# Patient Record
Sex: Female | Born: 1945 | Race: Black or African American | Hispanic: No | Marital: Single | Smoking: Former smoker
Health system: Southern US, Community
[De-identification: ages and names within clinical notes are randomized; demographics above are authoritative.]

## PROBLEM LIST (undated history)

## (undated) DIAGNOSIS — M1990 Unspecified osteoarthritis, unspecified site: Secondary | ICD-10-CM

## (undated) DIAGNOSIS — E785 Hyperlipidemia, unspecified: Secondary | ICD-10-CM

## (undated) DIAGNOSIS — Z8614 Personal history of Methicillin resistant Staphylococcus aureus infection: Secondary | ICD-10-CM

## (undated) DIAGNOSIS — K219 Gastro-esophageal reflux disease without esophagitis: Secondary | ICD-10-CM

## (undated) DIAGNOSIS — E119 Type 2 diabetes mellitus without complications: Secondary | ICD-10-CM

## (undated) DIAGNOSIS — Z8719 Personal history of other diseases of the digestive system: Secondary | ICD-10-CM

## (undated) DIAGNOSIS — R3915 Urgency of urination: Secondary | ICD-10-CM

## (undated) DIAGNOSIS — K5909 Other constipation: Secondary | ICD-10-CM

## (undated) DIAGNOSIS — R7303 Prediabetes: Secondary | ICD-10-CM

## (undated) DIAGNOSIS — I10 Essential (primary) hypertension: Secondary | ICD-10-CM

## (undated) DIAGNOSIS — F329 Major depressive disorder, single episode, unspecified: Secondary | ICD-10-CM

## (undated) DIAGNOSIS — Z87440 Personal history of urinary (tract) infections: Secondary | ICD-10-CM

## (undated) DIAGNOSIS — N8110 Cystocele, unspecified: Secondary | ICD-10-CM

## (undated) DIAGNOSIS — F32A Depression, unspecified: Secondary | ICD-10-CM

## (undated) HISTORY — PX: APPENDECTOMY: SHX54

## (undated) HISTORY — DX: Depression, unspecified: F32.A

## (undated) HISTORY — DX: Major depressive disorder, single episode, unspecified: F32.9

## (undated) HISTORY — DX: Essential (primary) hypertension: I10

## (undated) HISTORY — DX: Hyperlipidemia, unspecified: E78.5

## (undated) HISTORY — PX: VAGINAL HYSTERECTOMY: SUR661

## (undated) HISTORY — PX: COLONOSCOPY: SHX174

## (undated) HISTORY — DX: Gastro-esophageal reflux disease without esophagitis: K21.9

## (undated) HISTORY — DX: Personal history of Methicillin resistant Staphylococcus aureus infection: Z86.14

## (undated) HISTORY — DX: Type 2 diabetes mellitus without complications: E11.9

## (undated) HISTORY — DX: Unspecified osteoarthritis, unspecified site: M19.90

---

## 1987-11-24 HISTORY — PX: BREAST EXCISIONAL BIOPSY: SUR124

## 1998-06-18 ENCOUNTER — Ambulatory Visit (HOSPITAL_COMMUNITY): Admission: RE | Admit: 1998-06-18 | Discharge: 1998-06-18 | Payer: Self-pay | Admitting: Family Medicine

## 1998-08-30 ENCOUNTER — Emergency Department (HOSPITAL_COMMUNITY): Admission: EM | Admit: 1998-08-30 | Discharge: 1998-08-30 | Payer: Self-pay | Admitting: Emergency Medicine

## 1998-12-30 ENCOUNTER — Encounter: Payer: Self-pay | Admitting: Emergency Medicine

## 1998-12-30 ENCOUNTER — Emergency Department (HOSPITAL_COMMUNITY): Admission: EM | Admit: 1998-12-30 | Discharge: 1998-12-30 | Payer: Self-pay | Admitting: Emergency Medicine

## 1999-01-25 ENCOUNTER — Emergency Department (HOSPITAL_COMMUNITY): Admission: EM | Admit: 1999-01-25 | Discharge: 1999-01-25 | Payer: Self-pay | Admitting: Emergency Medicine

## 1999-05-26 ENCOUNTER — Ambulatory Visit (HOSPITAL_COMMUNITY): Admission: RE | Admit: 1999-05-26 | Discharge: 1999-05-26 | Payer: Self-pay | Admitting: Family Medicine

## 2000-03-13 ENCOUNTER — Encounter: Payer: Self-pay | Admitting: Emergency Medicine

## 2000-03-13 ENCOUNTER — Emergency Department (HOSPITAL_COMMUNITY): Admission: EM | Admit: 2000-03-13 | Discharge: 2000-03-13 | Payer: Self-pay | Admitting: Emergency Medicine

## 2000-06-08 ENCOUNTER — Ambulatory Visit (HOSPITAL_COMMUNITY): Admission: RE | Admit: 2000-06-08 | Discharge: 2000-06-08 | Payer: Self-pay | Admitting: Family Medicine

## 2000-06-08 ENCOUNTER — Encounter: Payer: Self-pay | Admitting: Family Medicine

## 2000-12-31 ENCOUNTER — Emergency Department (HOSPITAL_COMMUNITY): Admission: EM | Admit: 2000-12-31 | Discharge: 2001-01-01 | Payer: Self-pay | Admitting: Emergency Medicine

## 2001-01-01 ENCOUNTER — Encounter: Payer: Self-pay | Admitting: Emergency Medicine

## 2001-01-25 ENCOUNTER — Other Ambulatory Visit: Admission: RE | Admit: 2001-01-25 | Discharge: 2001-01-25 | Payer: Self-pay | Admitting: *Deleted

## 2001-03-21 ENCOUNTER — Encounter: Admission: RE | Admit: 2001-03-21 | Discharge: 2001-03-21 | Payer: Self-pay | Admitting: Family Medicine

## 2001-03-21 ENCOUNTER — Encounter: Payer: Self-pay | Admitting: Family Medicine

## 2001-05-10 ENCOUNTER — Ambulatory Visit (HOSPITAL_BASED_OUTPATIENT_CLINIC_OR_DEPARTMENT_OTHER): Admission: RE | Admit: 2001-05-10 | Discharge: 2001-05-10 | Payer: Self-pay | Admitting: General Surgery

## 2001-05-10 HISTORY — PX: INGUINAL HERNIA REPAIR: SHX194

## 2001-06-13 ENCOUNTER — Ambulatory Visit (HOSPITAL_COMMUNITY): Admission: RE | Admit: 2001-06-13 | Discharge: 2001-06-13 | Payer: Self-pay | Admitting: Family Medicine

## 2001-08-09 ENCOUNTER — Emergency Department (HOSPITAL_COMMUNITY): Admission: EM | Admit: 2001-08-09 | Discharge: 2001-08-09 | Payer: Self-pay | Admitting: Emergency Medicine

## 2001-08-09 ENCOUNTER — Encounter: Payer: Self-pay | Admitting: Emergency Medicine

## 2001-09-06 ENCOUNTER — Encounter: Admission: RE | Admit: 2001-09-06 | Discharge: 2001-10-11 | Payer: Self-pay | Admitting: Family Medicine

## 2001-12-05 ENCOUNTER — Encounter: Payer: Self-pay | Admitting: Family Medicine

## 2001-12-05 ENCOUNTER — Encounter: Admission: RE | Admit: 2001-12-05 | Discharge: 2001-12-05 | Payer: Self-pay | Admitting: Family Medicine

## 2002-05-15 ENCOUNTER — Ambulatory Visit (HOSPITAL_COMMUNITY): Admission: RE | Admit: 2002-05-15 | Discharge: 2002-05-15 | Payer: Self-pay | Admitting: Family Medicine

## 2002-05-15 ENCOUNTER — Encounter: Payer: Self-pay | Admitting: Family Medicine

## 2003-06-04 ENCOUNTER — Ambulatory Visit (HOSPITAL_COMMUNITY): Admission: RE | Admit: 2003-06-04 | Discharge: 2003-06-04 | Payer: Self-pay | Admitting: Family Medicine

## 2003-06-04 ENCOUNTER — Encounter: Payer: Self-pay | Admitting: Family Medicine

## 2004-01-01 ENCOUNTER — Emergency Department (HOSPITAL_COMMUNITY): Admission: EM | Admit: 2004-01-01 | Discharge: 2004-01-01 | Payer: Self-pay | Admitting: Emergency Medicine

## 2004-02-10 ENCOUNTER — Emergency Department (HOSPITAL_COMMUNITY): Admission: EM | Admit: 2004-02-10 | Discharge: 2004-02-10 | Payer: Self-pay

## 2004-03-20 ENCOUNTER — Other Ambulatory Visit: Admission: RE | Admit: 2004-03-20 | Discharge: 2004-03-20 | Payer: Self-pay | Admitting: Family Medicine

## 2004-07-06 ENCOUNTER — Emergency Department (HOSPITAL_COMMUNITY): Admission: EM | Admit: 2004-07-06 | Discharge: 2004-07-06 | Payer: Self-pay | Admitting: Nurse Practitioner

## 2004-07-07 ENCOUNTER — Ambulatory Visit (HOSPITAL_COMMUNITY): Admission: RE | Admit: 2004-07-07 | Discharge: 2004-07-07 | Payer: Self-pay | Admitting: Family Medicine

## 2005-01-27 ENCOUNTER — Emergency Department (HOSPITAL_COMMUNITY): Admission: EM | Admit: 2005-01-27 | Discharge: 2005-01-27 | Payer: Self-pay | Admitting: Emergency Medicine

## 2005-02-20 ENCOUNTER — Emergency Department (HOSPITAL_COMMUNITY): Admission: EM | Admit: 2005-02-20 | Discharge: 2005-02-20 | Payer: Self-pay | Admitting: Emergency Medicine

## 2005-06-15 ENCOUNTER — Ambulatory Visit: Payer: Self-pay | Admitting: Gastroenterology

## 2005-06-22 ENCOUNTER — Ambulatory Visit: Payer: Self-pay | Admitting: Gastroenterology

## 2005-07-20 ENCOUNTER — Ambulatory Visit (HOSPITAL_COMMUNITY): Admission: RE | Admit: 2005-07-20 | Discharge: 2005-07-20 | Payer: Self-pay | Admitting: Obstetrics and Gynecology

## 2006-06-19 ENCOUNTER — Emergency Department (HOSPITAL_COMMUNITY): Admission: EM | Admit: 2006-06-19 | Discharge: 2006-06-19 | Payer: Self-pay | Admitting: Emergency Medicine

## 2006-07-05 ENCOUNTER — Ambulatory Visit: Payer: Self-pay | Admitting: Internal Medicine

## 2006-07-06 ENCOUNTER — Ambulatory Visit: Payer: Self-pay | Admitting: Internal Medicine

## 2006-07-15 ENCOUNTER — Ambulatory Visit: Payer: Self-pay | Admitting: Internal Medicine

## 2006-07-22 ENCOUNTER — Ambulatory Visit (HOSPITAL_COMMUNITY): Admission: RE | Admit: 2006-07-22 | Discharge: 2006-07-22 | Payer: Self-pay | Admitting: Internal Medicine

## 2006-07-24 ENCOUNTER — Emergency Department (HOSPITAL_COMMUNITY): Admission: EM | Admit: 2006-07-24 | Discharge: 2006-07-24 | Payer: Self-pay | Admitting: Emergency Medicine

## 2006-08-06 ENCOUNTER — Ambulatory Visit: Payer: Self-pay | Admitting: Internal Medicine

## 2006-08-17 ENCOUNTER — Ambulatory Visit: Payer: Self-pay | Admitting: Internal Medicine

## 2006-11-29 ENCOUNTER — Ambulatory Visit: Payer: Self-pay | Admitting: Internal Medicine

## 2007-01-24 ENCOUNTER — Ambulatory Visit: Payer: Self-pay | Admitting: Internal Medicine

## 2007-01-24 LAB — CONVERTED CEMR LAB
ALT: 88 units/L — ABNORMAL HIGH (ref 0–40)
CO2: 31 meq/L (ref 19–32)
Calcium: 9.4 mg/dL (ref 8.4–10.5)
Creatinine, Ser: 0.8 mg/dL (ref 0.4–1.2)
GFR calc Af Amer: 95 mL/min
GFR calc non Af Amer: 78 mL/min
Glucose, Bld: 118 mg/dL — ABNORMAL HIGH (ref 70–99)
Sodium: 140 meq/L (ref 135–145)
Triglycerides: 75 mg/dL (ref 0–149)
VLDL: 15 mg/dL (ref 0–40)

## 2007-01-31 ENCOUNTER — Ambulatory Visit: Payer: Self-pay | Admitting: Internal Medicine

## 2007-02-07 ENCOUNTER — Ambulatory Visit: Payer: Self-pay | Admitting: Internal Medicine

## 2007-02-07 LAB — CONVERTED CEMR LAB
Bacteria, UA: NEGATIVE
Nitrite: NEGATIVE
RBC / HPF: NONE SEEN
Specific Gravity, Urine: 1.01 (ref 1.000–1.03)
Urine Glucose: NEGATIVE mg/dL
pH: 6 (ref 5.0–8.0)

## 2007-03-28 ENCOUNTER — Ambulatory Visit: Payer: Self-pay | Admitting: Internal Medicine

## 2007-03-28 LAB — CONVERTED CEMR LAB
AST: 18 units/L (ref 0–37)
Cholesterol: 244 mg/dL (ref 0–200)
Direct LDL: 155.7 mg/dL
Hgb A1c MFr Bld: 6.6 % — ABNORMAL HIGH (ref 4.6–6.0)
Microalb Creat Ratio: 3.4 mg/g (ref 0.0–30.0)
Microalb, Ur: 0.6 mg/dL (ref 0.0–1.9)
Total CHOL/HDL Ratio: 4.1

## 2007-05-09 ENCOUNTER — Ambulatory Visit: Payer: Self-pay | Admitting: Internal Medicine

## 2007-05-14 ENCOUNTER — Ambulatory Visit: Payer: Self-pay | Admitting: Family Medicine

## 2007-06-16 ENCOUNTER — Ambulatory Visit: Payer: Self-pay | Admitting: Internal Medicine

## 2007-07-15 ENCOUNTER — Ambulatory Visit: Payer: Self-pay | Admitting: Internal Medicine

## 2007-07-15 LAB — CONVERTED CEMR LAB
ALT: 30 units/L (ref 0–35)
AST: 28 units/L (ref 0–37)
Cholesterol: 190 mg/dL (ref 0–200)
HDL: 59.8 mg/dL (ref >39.0)
LDL Cholesterol: 114 mg/dL — ABNORMAL HIGH (ref 0–99)
Total CHOL/HDL Ratio: 3.2

## 2007-08-11 ENCOUNTER — Ambulatory Visit (HOSPITAL_COMMUNITY): Admission: RE | Admit: 2007-08-11 | Discharge: 2007-08-11 | Payer: Self-pay | Admitting: Internal Medicine

## 2007-09-16 ENCOUNTER — Encounter: Admission: RE | Admit: 2007-09-16 | Discharge: 2007-09-16 | Payer: Self-pay | Admitting: Specialist

## 2007-09-30 ENCOUNTER — Telehealth: Payer: Self-pay | Admitting: Internal Medicine

## 2007-10-13 DIAGNOSIS — E785 Hyperlipidemia, unspecified: Secondary | ICD-10-CM | POA: Insufficient documentation

## 2007-10-13 DIAGNOSIS — F339 Major depressive disorder, recurrent, unspecified: Secondary | ICD-10-CM

## 2007-10-13 DIAGNOSIS — I10 Essential (primary) hypertension: Secondary | ICD-10-CM | POA: Insufficient documentation

## 2007-10-14 ENCOUNTER — Ambulatory Visit: Payer: Self-pay | Admitting: Internal Medicine

## 2007-10-14 DIAGNOSIS — G5600 Carpal tunnel syndrome, unspecified upper limb: Secondary | ICD-10-CM | POA: Insufficient documentation

## 2007-10-14 DIAGNOSIS — M25569 Pain in unspecified knee: Secondary | ICD-10-CM

## 2007-10-14 DIAGNOSIS — E119 Type 2 diabetes mellitus without complications: Secondary | ICD-10-CM | POA: Insufficient documentation

## 2007-10-14 DIAGNOSIS — M549 Dorsalgia, unspecified: Secondary | ICD-10-CM | POA: Insufficient documentation

## 2007-10-17 ENCOUNTER — Encounter (INDEPENDENT_AMBULATORY_CARE_PROVIDER_SITE_OTHER): Payer: Self-pay | Admitting: *Deleted

## 2007-10-28 ENCOUNTER — Telehealth: Payer: Self-pay | Admitting: Internal Medicine

## 2007-10-31 ENCOUNTER — Telehealth (INDEPENDENT_AMBULATORY_CARE_PROVIDER_SITE_OTHER): Payer: Self-pay | Admitting: *Deleted

## 2007-11-01 ENCOUNTER — Ambulatory Visit: Payer: Self-pay | Admitting: Internal Medicine

## 2007-11-01 DIAGNOSIS — J069 Acute upper respiratory infection, unspecified: Secondary | ICD-10-CM | POA: Insufficient documentation

## 2007-11-11 ENCOUNTER — Telehealth: Payer: Self-pay | Admitting: Internal Medicine

## 2007-11-14 ENCOUNTER — Ambulatory Visit: Payer: Self-pay | Admitting: Internal Medicine

## 2007-11-14 DIAGNOSIS — R300 Dysuria: Secondary | ICD-10-CM | POA: Insufficient documentation

## 2007-11-15 ENCOUNTER — Encounter: Payer: Self-pay | Admitting: Internal Medicine

## 2007-11-15 LAB — CONVERTED CEMR LAB
Crystals: NEGATIVE
Nitrite: POSITIVE — AB
Specific Gravity, Urine: 1.01 (ref 1.000–1.03)
Total Protein, Urine: NEGATIVE mg/dL

## 2007-12-08 ENCOUNTER — Ambulatory Visit: Payer: Self-pay | Admitting: Internal Medicine

## 2007-12-08 LAB — CONVERTED CEMR LAB
AST: 19 units/L (ref 0–37)
BUN: 12 mg/dL (ref 6–23)
Bilirubin Urine: NEGATIVE
CO2: 30 meq/L (ref 19–32)
Calcium: 9.5 mg/dL (ref 8.4–10.5)
Chloride: 106 meq/L (ref 96–112)
Creatinine, Ser: 0.7 mg/dL (ref 0.4–1.2)
Crystals: NEGATIVE
Glucose, Bld: 96 mg/dL (ref 70–99)
Hemoglobin, Urine: NEGATIVE
Ketones, ur: NEGATIVE mg/dL
LDL Cholesterol: 92 mg/dL (ref 0–99)
Nitrite: NEGATIVE
Potassium: 4.3 meq/L (ref 3.5–5.1)
RBC / HPF: NONE SEEN
Sodium: 142 meq/L (ref 135–145)
Specific Gravity, Urine: 1.015 (ref 1.000–1.03)
Total Protein, Urine: NEGATIVE mg/dL
Total Protein: 6.4 g/dL (ref 6.0–8.3)
Urine Glucose: NEGATIVE mg/dL
Urobilinogen, UA: 0.2 (ref 0.0–1.0)
VLDL: 11 mg/dL (ref 0–40)
pH: 7 (ref 5.0–8.0)

## 2007-12-19 ENCOUNTER — Ambulatory Visit: Payer: Self-pay | Admitting: Internal Medicine

## 2008-01-12 ENCOUNTER — Telehealth: Payer: Self-pay | Admitting: Internal Medicine

## 2008-01-27 ENCOUNTER — Telehealth (INDEPENDENT_AMBULATORY_CARE_PROVIDER_SITE_OTHER): Payer: Self-pay | Admitting: *Deleted

## 2008-01-31 ENCOUNTER — Encounter (INDEPENDENT_AMBULATORY_CARE_PROVIDER_SITE_OTHER): Payer: Self-pay | Admitting: *Deleted

## 2008-02-10 ENCOUNTER — Ambulatory Visit: Payer: Self-pay | Admitting: Internal Medicine

## 2008-02-10 DIAGNOSIS — N390 Urinary tract infection, site not specified: Secondary | ICD-10-CM | POA: Insufficient documentation

## 2008-02-10 DIAGNOSIS — L299 Pruritus, unspecified: Secondary | ICD-10-CM | POA: Insufficient documentation

## 2008-02-10 LAB — CONVERTED CEMR LAB
Blood in Urine, dipstick: NEGATIVE
Nitrite: NEGATIVE
Protein, U semiquant: NEGATIVE
Specific Gravity, Urine: 1.01

## 2008-03-20 ENCOUNTER — Telehealth (INDEPENDENT_AMBULATORY_CARE_PROVIDER_SITE_OTHER): Payer: Self-pay | Admitting: *Deleted

## 2008-04-04 ENCOUNTER — Telehealth: Payer: Self-pay | Admitting: Internal Medicine

## 2008-04-06 ENCOUNTER — Encounter: Payer: Self-pay | Admitting: Internal Medicine

## 2008-04-11 ENCOUNTER — Ambulatory Visit: Payer: Self-pay | Admitting: Internal Medicine

## 2008-04-11 LAB — CONVERTED CEMR LAB: Creatinine,U: 124.2 mg/dL

## 2008-04-23 ENCOUNTER — Ambulatory Visit: Payer: Self-pay | Admitting: Internal Medicine

## 2008-04-23 ENCOUNTER — Telehealth: Payer: Self-pay | Admitting: Internal Medicine

## 2008-07-17 ENCOUNTER — Encounter: Payer: Self-pay | Admitting: Internal Medicine

## 2008-07-31 ENCOUNTER — Telehealth: Payer: Self-pay | Admitting: Internal Medicine

## 2008-08-14 ENCOUNTER — Ambulatory Visit (HOSPITAL_COMMUNITY): Admission: RE | Admit: 2008-08-14 | Discharge: 2008-08-14 | Payer: Self-pay | Admitting: Internal Medicine

## 2008-08-17 ENCOUNTER — Encounter: Payer: Self-pay | Admitting: Internal Medicine

## 2008-09-13 ENCOUNTER — Telehealth: Payer: Self-pay | Admitting: Internal Medicine

## 2008-10-08 ENCOUNTER — Telehealth (INDEPENDENT_AMBULATORY_CARE_PROVIDER_SITE_OTHER): Payer: Self-pay | Admitting: *Deleted

## 2008-10-23 ENCOUNTER — Telehealth: Payer: Self-pay | Admitting: Internal Medicine

## 2008-10-29 ENCOUNTER — Encounter: Payer: Self-pay | Admitting: Internal Medicine

## 2008-10-31 ENCOUNTER — Ambulatory Visit: Payer: Self-pay | Admitting: Internal Medicine

## 2008-10-31 DIAGNOSIS — L0292 Furuncle, unspecified: Secondary | ICD-10-CM | POA: Insufficient documentation

## 2008-10-31 DIAGNOSIS — L0293 Carbuncle, unspecified: Secondary | ICD-10-CM

## 2008-11-08 ENCOUNTER — Ambulatory Visit: Payer: Self-pay | Admitting: Internal Medicine

## 2008-11-13 ENCOUNTER — Ambulatory Visit: Payer: Self-pay | Admitting: Internal Medicine

## 2008-11-13 LAB — CONVERTED CEMR LAB
BUN: 16 mg/dL (ref 6–23)
CO2: 29 meq/L (ref 19–32)
Calcium: 9.1 mg/dL (ref 8.4–10.5)
Cholesterol: 167 mg/dL (ref 0–200)
Creatinine, Ser: 0.7 mg/dL (ref 0.4–1.2)
Creatinine,U: 159.8 mg/dL
GFR calc Af Amer: 110 mL/min
GFR calc non Af Amer: 91 mL/min
Glucose, Bld: 96 mg/dL (ref 70–99)
HDL: 65.1 mg/dL (ref >39.0)
LDL Cholesterol: 92 mg/dL (ref 0–99)
Microalb Creat Ratio: 2.5 mg/g (ref 0.0–30.0)
Potassium: 3.9 meq/L (ref 3.5–5.1)
Total CHOL/HDL Ratio: 2.6
Triglycerides: 52 mg/dL (ref 0–149)
VLDL: 10 mg/dL (ref 0–40)

## 2008-11-27 ENCOUNTER — Telehealth: Payer: Self-pay | Admitting: Internal Medicine

## 2008-11-28 ENCOUNTER — Ambulatory Visit: Payer: Self-pay | Admitting: Internal Medicine

## 2008-11-28 LAB — CONVERTED CEMR LAB
Crystals: NEGATIVE
Nitrite: POSITIVE — AB
Urine Glucose: NEGATIVE mg/dL
pH: 6 (ref 5.0–8.0)

## 2008-11-29 ENCOUNTER — Encounter: Payer: Self-pay | Admitting: Internal Medicine

## 2008-11-29 ENCOUNTER — Telehealth: Payer: Self-pay | Admitting: Internal Medicine

## 2008-12-05 ENCOUNTER — Telehealth: Payer: Self-pay | Admitting: Internal Medicine

## 2008-12-11 ENCOUNTER — Ambulatory Visit: Payer: Self-pay | Admitting: Internal Medicine

## 2008-12-14 ENCOUNTER — Telehealth: Payer: Self-pay | Admitting: Internal Medicine

## 2008-12-20 ENCOUNTER — Ambulatory Visit: Payer: Self-pay | Admitting: Internal Medicine

## 2009-01-16 ENCOUNTER — Telehealth: Payer: Self-pay | Admitting: Internal Medicine

## 2009-01-21 ENCOUNTER — Ambulatory Visit: Payer: Self-pay | Admitting: Internal Medicine

## 2009-01-23 ENCOUNTER — Telehealth (INDEPENDENT_AMBULATORY_CARE_PROVIDER_SITE_OTHER): Payer: Self-pay | Admitting: *Deleted

## 2009-01-25 ENCOUNTER — Encounter (INDEPENDENT_AMBULATORY_CARE_PROVIDER_SITE_OTHER): Payer: Self-pay | Admitting: *Deleted

## 2009-02-07 ENCOUNTER — Ambulatory Visit: Payer: Self-pay | Admitting: Internal Medicine

## 2009-03-18 ENCOUNTER — Telehealth: Payer: Self-pay | Admitting: Internal Medicine

## 2009-03-20 ENCOUNTER — Ambulatory Visit: Payer: Self-pay | Admitting: Internal Medicine

## 2009-03-20 LAB — CONVERTED CEMR LAB
Hemoglobin, Urine: NEGATIVE
Nitrite: NEGATIVE
Specific Gravity, Urine: 1.015 (ref 1.000–1.030)
Urobilinogen, UA: 0.2 (ref 0.0–1.0)
pH: 7.5 (ref 5.0–8.0)

## 2009-03-21 ENCOUNTER — Encounter: Payer: Self-pay | Admitting: Internal Medicine

## 2009-03-22 ENCOUNTER — Telehealth: Payer: Self-pay | Admitting: Internal Medicine

## 2009-05-06 ENCOUNTER — Telehealth: Payer: Self-pay | Admitting: Family Medicine

## 2009-06-02 ENCOUNTER — Emergency Department (HOSPITAL_COMMUNITY): Admission: EM | Admit: 2009-06-02 | Discharge: 2009-06-02 | Payer: Self-pay | Admitting: Emergency Medicine

## 2009-07-02 ENCOUNTER — Ambulatory Visit: Payer: Self-pay | Admitting: Internal Medicine

## 2009-07-02 DIAGNOSIS — S93409A Sprain of unspecified ligament of unspecified ankle, initial encounter: Secondary | ICD-10-CM | POA: Insufficient documentation

## 2009-07-02 LAB — CONVERTED CEMR LAB
Glucose, Urine, Semiquant: NEGATIVE
Nitrite: POSITIVE
Protein, U semiquant: 30

## 2009-07-04 ENCOUNTER — Telehealth: Payer: Self-pay | Admitting: Internal Medicine

## 2009-07-20 ENCOUNTER — Emergency Department (HOSPITAL_COMMUNITY): Admission: EM | Admit: 2009-07-20 | Discharge: 2009-07-20 | Payer: Self-pay | Admitting: Emergency Medicine

## 2009-07-31 ENCOUNTER — Ambulatory Visit: Payer: Self-pay | Admitting: Internal Medicine

## 2009-07-31 LAB — CONVERTED CEMR LAB
ALT: 17 units/L (ref 0–35)
AST: 23 units/L (ref 0–37)
Albumin: 4.3 g/dL (ref 3.5–5.2)
Alkaline Phosphatase: 44 units/L (ref 39–117)
Bilirubin, Direct: 0.1 mg/dL (ref 0.0–0.3)
Chloride: 103 meq/L (ref 96–112)
Cholesterol: 194 mg/dL (ref 0–200)
Creatinine, Ser: 0.87 mg/dL (ref 0.40–1.20)
HDL: 66 mg/dL (ref >39)
Indirect Bilirubin: 0.4 mg/dL (ref 0.0–0.9)
Microalb Creat Ratio: 8.2 mg/g (ref 0.0–30.0)
Microalb, Ur: 0.52 mg/dL (ref 0.00–1.89)
Sodium: 141 meq/L (ref 135–145)
Total Protein: 6.6 g/dL (ref 6.0–8.3)

## 2009-08-05 ENCOUNTER — Ambulatory Visit: Payer: Self-pay | Admitting: Internal Medicine

## 2009-09-18 ENCOUNTER — Ambulatory Visit (HOSPITAL_COMMUNITY): Admission: RE | Admit: 2009-09-18 | Discharge: 2009-09-18 | Payer: Self-pay | Admitting: Internal Medicine

## 2009-10-04 ENCOUNTER — Telehealth: Payer: Self-pay | Admitting: Internal Medicine

## 2009-10-05 ENCOUNTER — Ambulatory Visit: Payer: Self-pay | Admitting: Family Medicine

## 2009-10-05 LAB — CONVERTED CEMR LAB
Bilirubin Urine: NEGATIVE
Glucose, Urine, Semiquant: NEGATIVE
Ketones, urine, test strip: NEGATIVE
Specific Gravity, Urine: 1.02

## 2009-10-28 ENCOUNTER — Telehealth: Payer: Self-pay | Admitting: Internal Medicine

## 2009-10-28 ENCOUNTER — Telehealth: Payer: Self-pay | Admitting: Family Medicine

## 2009-12-17 ENCOUNTER — Telehealth: Payer: Self-pay | Admitting: Internal Medicine

## 2009-12-20 ENCOUNTER — Encounter: Payer: Self-pay | Admitting: Internal Medicine

## 2009-12-21 ENCOUNTER — Encounter: Payer: Self-pay | Admitting: Internal Medicine

## 2009-12-21 LAB — CONVERTED CEMR LAB

## 2009-12-22 ENCOUNTER — Telehealth: Payer: Self-pay | Admitting: Internal Medicine

## 2010-01-01 ENCOUNTER — Telehealth: Payer: Self-pay | Admitting: Internal Medicine

## 2010-01-29 ENCOUNTER — Ambulatory Visit: Payer: Self-pay | Admitting: Internal Medicine

## 2010-01-31 ENCOUNTER — Ambulatory Visit: Payer: Self-pay | Admitting: Internal Medicine

## 2010-01-31 ENCOUNTER — Telehealth: Payer: Self-pay | Admitting: Internal Medicine

## 2010-02-10 ENCOUNTER — Telehealth: Payer: Self-pay | Admitting: Internal Medicine

## 2010-02-11 ENCOUNTER — Ambulatory Visit: Payer: Self-pay | Admitting: Internal Medicine

## 2010-02-11 LAB — CONVERTED CEMR LAB
Calcium: 10 mg/dL (ref 8.4–10.5)
Glucose, Urine, Semiquant: NEGATIVE
Hgb A1c MFr Bld: 6.8 % — ABNORMAL HIGH (ref 4.6–6.1)
Ketones, urine, test strip: NEGATIVE
Nitrite: POSITIVE
Potassium: 4.1 meq/L (ref 3.5–5.3)
Specific Gravity, Urine: 1.01
Urobilinogen, UA: 0.2

## 2010-02-12 ENCOUNTER — Encounter: Payer: Self-pay | Admitting: Internal Medicine

## 2010-02-24 ENCOUNTER — Ambulatory Visit: Payer: Self-pay | Admitting: Internal Medicine

## 2010-03-10 ENCOUNTER — Telehealth: Payer: Self-pay | Admitting: Internal Medicine

## 2010-03-14 ENCOUNTER — Emergency Department (HOSPITAL_COMMUNITY): Admission: EM | Admit: 2010-03-14 | Discharge: 2010-03-14 | Payer: Self-pay | Admitting: Emergency Medicine

## 2010-03-24 ENCOUNTER — Telehealth: Payer: Self-pay | Admitting: Internal Medicine

## 2010-04-04 ENCOUNTER — Telehealth: Payer: Self-pay | Admitting: Internal Medicine

## 2010-04-07 ENCOUNTER — Ambulatory Visit: Payer: Self-pay | Admitting: Internal Medicine

## 2010-04-07 DIAGNOSIS — L0231 Cutaneous abscess of buttock: Secondary | ICD-10-CM | POA: Insufficient documentation

## 2010-04-07 DIAGNOSIS — L03317 Cellulitis of buttock: Secondary | ICD-10-CM

## 2010-05-14 ENCOUNTER — Telehealth: Payer: Self-pay | Admitting: Internal Medicine

## 2010-06-14 ENCOUNTER — Emergency Department (HOSPITAL_COMMUNITY): Admission: EM | Admit: 2010-06-14 | Discharge: 2010-06-14 | Payer: Self-pay | Admitting: Emergency Medicine

## 2010-06-16 ENCOUNTER — Ambulatory Visit: Payer: Self-pay | Admitting: Internal Medicine

## 2010-06-18 ENCOUNTER — Emergency Department (HOSPITAL_COMMUNITY): Admission: EM | Admit: 2010-06-18 | Discharge: 2010-06-18 | Payer: Self-pay | Admitting: Emergency Medicine

## 2010-06-23 ENCOUNTER — Ambulatory Visit: Payer: Self-pay | Admitting: Internal Medicine

## 2010-06-23 ENCOUNTER — Telehealth: Payer: Self-pay | Admitting: Internal Medicine

## 2010-06-27 ENCOUNTER — Telehealth: Payer: Self-pay | Admitting: Internal Medicine

## 2010-07-07 ENCOUNTER — Ambulatory Visit: Payer: Self-pay | Admitting: Internal Medicine

## 2010-07-07 ENCOUNTER — Telehealth: Payer: Self-pay | Admitting: Internal Medicine

## 2010-07-07 LAB — CONVERTED CEMR LAB
Ketones, urine, test strip: NEGATIVE
Protein, U semiquant: NEGATIVE
Specific Gravity, Urine: <1.005
Urobilinogen, UA: 0.2
WBC Urine, dipstick: NEGATIVE
pH: 5

## 2010-08-22 ENCOUNTER — Ambulatory Visit: Payer: Self-pay | Admitting: Internal Medicine

## 2010-08-22 LAB — CONVERTED CEMR LAB
ALT: 15 units/L (ref 0–35)
Albumin: 4.1 g/dL (ref 3.5–5.2)
BUN: 16 mg/dL (ref 6–23)
Calcium: 9.6 mg/dL (ref 8.4–10.5)
Creatinine, Ser: 0.8 mg/dL (ref 0.4–1.2)
HDL: 64.6 mg/dL (ref >39.00)
LDL Cholesterol: 111 mg/dL — ABNORMAL HIGH (ref 0–99)
Sodium: 139 meq/L (ref 135–145)
Triglycerides: 62 mg/dL (ref 0.0–149.0)

## 2010-08-29 ENCOUNTER — Ambulatory Visit: Payer: Self-pay | Admitting: Internal Medicine

## 2010-08-29 DIAGNOSIS — R51 Headache: Secondary | ICD-10-CM

## 2010-08-29 DIAGNOSIS — R519 Headache, unspecified: Secondary | ICD-10-CM | POA: Insufficient documentation

## 2010-08-29 LAB — CONVERTED CEMR LAB

## 2010-09-19 ENCOUNTER — Ambulatory Visit (HOSPITAL_COMMUNITY): Admission: RE | Admit: 2010-09-19 | Discharge: 2010-09-19 | Payer: Self-pay | Admitting: Internal Medicine

## 2010-10-13 ENCOUNTER — Telehealth: Payer: Self-pay | Admitting: Internal Medicine

## 2010-10-14 ENCOUNTER — Telehealth: Payer: Self-pay | Admitting: Internal Medicine

## 2010-11-07 ENCOUNTER — Telehealth: Payer: Self-pay | Admitting: Internal Medicine

## 2010-11-08 ENCOUNTER — Ambulatory Visit: Payer: Self-pay | Admitting: Family Medicine

## 2010-11-08 LAB — CONVERTED CEMR LAB
Blood in Urine, dipstick: NEGATIVE
Glucose, Urine, Semiquant: NEGATIVE
Ketones, urine, test strip: NEGATIVE
Protein, U semiquant: NEGATIVE
WBC Urine, dipstick: NEGATIVE

## 2010-12-05 ENCOUNTER — Telehealth: Payer: Self-pay | Admitting: Internal Medicine

## 2010-12-23 NOTE — Miscellaneous (Signed)
Summary: Orders Update  Clinical Lists Changes  Orders: Added new Test order of T-Culture, Urine ([id number]) - Signed Added new Test order of TLB-Udip w/ Micro ([id number]) - Signed

## 2010-12-23 NOTE — Assessment & Plan Note (Signed)
Summary: check boil/mhf--Rm 3   Vital Signs:  Patient profile:   65 year old female Height:      63.5 inches Weight:      152.75 pounds BMI:     26.73 Temp:     98.2 degrees F oral Pulse rate:   72 / minute Pulse rhythm:   regular Resp:     16 per minute BP sitting:   120 / 80  (right arm) Cuff size:   regular  Vitals Entered By: Mervin Kung CMA Duncan Dull) (June 16, 2010 8:10 AM) CC: Room 3  Pt here for follow up of boil; seen in ER on Saturday. Is Patient Diabetic? Yes Comments Pt requests work note for today. Pt has completed Patanol and Sulfamethoxazole. Nicki Guadalajara Fergerson CMA Duncan Dull)  June 16, 2010 8:15 AM    Primary Care Provider:  [name]  CC:  Room 3  Pt here for follow up of boil; seen in ER on Saturday.[name]  History of Present Illness: 65 y/o AA female for ER f/u pt seen for right buttock large boil it was lanced , drained and packed she has been taking doxy no fever but area still tender   Allergies: 1)  Penicillin G Potassium (Penicillin G Potassium)  Past History:  Past Medical History: Diabetes mellitus type 2 Hyperlipidemia  Hypertension   History of depression    DJD   Low back pain  Recurent Carbuncles - MRSA     Family History: Mother deceased age 61 secondary to brain aneurysm. Father deceased age 39 - liver cancer Grandmother with diabetes           Social History: Single Former Smoker  -  quit 10 to 11 years ago (light smoker) Alcohol use-yes   2 children Occupation: works at a prison          Physical Exam  General:  alert, well-developed, and well-nourished.   Lungs:  normal respiratory effort and normal breath sounds.   Heart:  normal rate, regular rhythm, and no gallop.   Skin:  2x3 cm boil, right buttock  no drainage,  some inderation   Impression & Recommendations:  Problem # 1:  CELLULITIS AND ABSCESS OF BUTTOCK (ICD-682.5) right buttock boil initially tx ed in ER.  pt still having pain and redness. change abx.   packing changed.  pt instructed to removed packing in 24-48 hrs start sitz bath Call our office if your symptoms do not  improve or gets worse.  The following medications were removed from the medication list:    Sulfamethoxazole-tmp Ds 800-160 Mg Tabs (Sulfamethoxazole-trimethoprim) .[name]... Take one as directed to prevent utis  Complete Medication List: 1)  Folic Acid 1 Mg Tabs (Folic acid) .... Take 1 tablet by mouth once a day 2)  Metformin Hcl 500 Mg Tabs (Metformin hcl) .... 2 tabs by mouth bid 3)  Multivitamins Tabs (Multiple vitamin) .... One by mouth once daily 4)  Low-dose Aspirin 81 Mg Tabs (Aspirin) .... One by mouth once daily 5)  Caltrate 600+d 600-400 Mg-unit Tabs (Calcium carbonate-vitamin d) .... One by mouth once daily 6)  Fish Oil 1000 Mg Caps (Omega-3 fatty acids) .... Take 1 capsule by mouth once a day 7)  Simvastatin 40 Mg Tabs (Simvastatin) .... Take one tablet at bedtime 8)  Diovan 160 Mg Tabs (Valsartan) .... One half by mouth once daily 9)  Patanol 0.1 % Soln (Olopatadine hcl) .... One drop to each eye daily for allergy symptoms 10)  Hydrocodone-acetaminophen  5-325 Mg Tabs (Hydrocodone-acetaminophen) .... Take 1- 2 tablets every 4 to 6 hours as needed for pain.  Patient Instructions: 1)  Please schedule a follow-up appointment in 1 week 2)  Remove packing Tues evening 3)  Sitz bath starting Wednesday 4)  Call our office if your symptoms do not  improve or gets worse. Prescriptions: SMZ-TMP DS 800-160 MG TABS (SULFAMETHOXAZOLE-TRIMETHOPRIM) one by mouth two times a day  #14 x 0   Entered and Authorized by:   [name]   Signed by:   [name] on 06/16/2010   Method used:   Electronically to        [facility] [address] (retail)       [phone]       [place], [place]  [address]       Ph: [phone]       Fax: [phone]   RxID:   [id number] CLINDAMYCIN HCL 150 MG CAPS (CLINDAMYCIN HCL) one by mouth  tid  #21 x 0   Entered and Authorized by:   [name]   Signed by:   [name] on 06/16/2010   Method used:   Electronically to        [facility] [address] (retail)       [phone]       [place], [place]  [address]       Ph: [phone]       Fax: [phone]   RxID:   [id number]   Current Allergies (reviewed today): PENICILLIN G POTASSIUM (PENICILLIN G POTASSIUM)

## 2010-12-23 NOTE — Progress Notes (Signed)
Summary: Diovan Samples & Urinary discomfort  Phone Note Call from Patient Call back at Home Phone [phone] Call back at [phone]   Caller: Patient Summary of Call: patient called and left voice message stating she has 3 pills left for the Diovan and she would like to know if samples could be provided. Her message states that Dr Artist Pais normally provides samples to her and she is almost out. Last office visit was 9/10 Initial call taken by: Glendell Docker CMA,  December 17, 2009 4:02 PM  Follow-up for Phone Call        ok to provide samples Follow-up by: [name],  December 17, 2009 5:24 PM  Additional Follow-up for Phone Call Additional follow up Details #1::        attempted to contact patient at  [phone], no answer, call placed to  [phone], no answer no voice mail. Samples left at front desk for patient pick up  Additional Follow-up by: Glendell Docker CMA,  December 18, 2009 4:10 PM    Additional Follow-up for Phone Call Additional follow up Details #2::    patient called back and left voice  message stating she called back because she has not heard anything about  her sample request. Her message states she does not have a answering machine at home, so she could be reached at [phone]. A message could can be left if needed.  Patients messages states that she is having problems again with urinary frequency and discomfort. She states her insurance does not take affect until next week if an appointment is needed Follow-up by: Glendell Docker CMA,  December 19, 2009 12:04 PM  Additional Follow-up for Phone Call Additional follow up Details #3:: Details for Additional Follow-up Action Taken: She has to at least drop off urine sample  attempted to contact patient at number provided, female individual stated patient was not available, message was left per patient request advising him per Dr Artist Pais instructions Glendell Docker CMA  December 19, 2009 5:27 PM  Additional Follow-up by: D. Thomos Lemons  DO,  December 19, 2009 1:42 PM  Prescriptions: DIOVAN 160 MG TABS (VALSARTAN) one half by mouth once daily  #30 x 2   Entered by:   Glendell Docker CMA   Authorized by:   [name]   Signed by:   Glendell Docker CMA on 12/18/2009   Method used:   Samples Given   RxID:   [id number]

## 2010-12-23 NOTE — Progress Notes (Signed)
Summary: Pain Medication  Phone Note Call from Patient Call back at [phone]   Caller: Patient Call For: 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 She states that she is having sever pain in her knee and would like to know if Dr Artist Pais would provide a rx for pain. She states she has taken the Ibuprofen, but that is not giving her any relief from the pain. Initial call taken by: Glendell Docker CMA,  June 27, 2010 1:54 PM  Follow-up for Phone Call        patient called and left voice message stating the boil is still draining and she would like to know if she should take another round of antibiotics. Follow-up by: Glendell Docker CMA,  June 30, 2010 9:12 AM  Additional Follow-up for Phone Call Additional follow up Details #1::        I can not rx pain meds w/o evaluating pt.  pt boil is draining again,  I suggest OV Additional Follow-up by: [name],  June 30, 2010 9:31 AM    Additional Follow-up for Phone Call Additional follow up Details #2::    call placed to patient at [phone], no answer, no voice mail. call placed to patient at [phone], no answer. A detailed voice message was left informing patient per Dr Artist Pais instructions Follow-up by: Glendell Docker CMA,  June 30, 2010 10:25 AM

## 2010-12-23 NOTE — Progress Notes (Signed)
Summary: Boil  Phone Note Call from Patient Call back at [phone]   Caller: Patient Call For: [name] Summary of Call: patient called and left voice message requesting a rx for a boil,. Her message states the boil has started to bleed and it is sore. She also states she has been taking the antibiotics that she has.  Call was returned to patient , she states the boil is about the size of a quarter, it is draining bright red blood and is stinging in ssensation. She would like to know if she should continue to take the antibiotics that she has Initial call taken by: Glendell Docker CMA,  Apr 04, 2010 9:20 AM  Follow-up for Phone Call        needs OV Follow-up by: [name],  Apr 04, 2010 11:59 AM  Additional Follow-up for Phone Call Additional follow up Details #1::        Pt declines appt. at this time. States she will call back for appt. if needed.  Mervin Kung CMA  Apr 04, 2010 1:37 PM

## 2010-12-23 NOTE — Assessment & Plan Note (Signed)
Summary: DIABETES/MHF   Vital Signs:  Patient profile:   66 year old female Height:      63.5 inches Weight:      157.75 pounds BMI:     27.61 O2 Sat:      100 % on Room air Temp:     98.3 degrees F oral Pulse rate:   89 / minute Pulse rhythm:   regular Resp:     18 per minute BP sitting:   114 / 80  (right arm) Cuff size:   large  Vitals Entered By: Cathy [name] CMA (February 24, 2010 11:51 AM)  O2 Flow:  Room air CC: Rm 2- Follow up disease management, Type 2 diabetes mellitus follow-up   Primary Care Provider:  DThomos Lemons [name]  CC:  Rm 2- Follow up disease management and Type 2 diabetes mellitus follow-up.  History of Present Illness:   Type 2 Diabetes Mellitus Follow-Up      This is a 65 year old woman who presents for Type 2 diabetes mellitus follow-up.  The patient reports weight gain.  The patient denies the following symptoms: chest pain, vision loss, and foot ulcer.  Since the last visit the patient reports poor dietary compliance, compliance with medications, not exercising regularly, and monitoring blood glucose.  low blood sugar 88, high 130, avg 90-120   Preventive Screening-Counseling & Management  Alcohol-Tobacco     Smoking Status: quit  Contraindications/Deferment of Procedures/Staging:    Test/Procedure: PAP Smear    Reason for deferment: hysterectomy   Allergies: 1)  Penicillin G Potassium (Penicillin G Potassium)  Past History:  Past Medical History: Diabetes mellitus type 2 Hyperlipidemia Hypertension   History of depression   DJD   Low back pain  Carbuncle - MRSA     Past Surgical History: Breast Biopsy Appendectomy  Hysterectomy Tubal ligation     Inguinal herniorrhaphy       Family History: Mother deceased age 6 secondary to brain aneurysm. Father deceased age 49 - liver cancer Grandmother with diabetes         Social History: Single Former Smoker  -  quit 10 to 11 years ago (light smoker) Alcohol use-yes   2  children Occupation: works at a prison        Physical Exam  General:  alert, well-developed, and well-nourished.   Neck:  supple, no masses, and no carotid bruits.   Lungs:  normal respiratory effort and normal breath sounds.   Heart:  normal rate, regular rhythm, and no gallop.   Extremities:  No lower extremity edema   Diabetes Management Exam:    Foot Exam (with socks and/or shoes not present):       Inspection:          Left foot: normal          Right foot: normal   Impression & Recommendations:  Problem # 1:  DIABETES MELLITUS, TYPE II (ICD-250.00) Assessment Deteriorated Pt counseled on diet and exercise.  I provided educational material on carb counting.   limit to 100 grams per day.  Increase metformin to 1 gram by mouth two times a day.   Her updated medication list for this problem includes:    Metformin Hcl 500 Mg Tabs (Metformin hcl) .[name]... 2 tabs by mouth bid    Low-dose Aspirin 81 Mg Tabs (Aspirin) ..... One by mouth once daily    Diovan 160 Mg Tabs (Valsartan) ..... One half by mouth once daily  Labs Reviewed:  Creat: 0.80 (02/11/2010)    Reviewed HgBA1c results: 6.8 (02/11/2010)  6.4 (07/31/2009)  Complete Medication List: 1)  Folic Acid 1 Mg Tabs (Folic acid) .... Take 1 tablet by mouth once a day 2)  Metformin Hcl 500 Mg Tabs (Metformin hcl) .... 2 tabs by mouth bid 3)  Multivitamins Tabs (Multiple vitamin) .... One by mouth once daily 4)  Low-dose Aspirin 81 Mg Tabs (Aspirin) .... One by mouth once daily 5)  Caltrate 600+d 600-400 Mg-unit Tabs (Calcium carbonate-vitamin d) .... One by mouth once daily 6)  Fish Oil 500 Mg Caps (Omega-3 fatty acids) .... One by mouth once daily 7)  Simvastatin 40 Mg Tabs (Simvastatin) .... Take one tablet at bedtime 8)  Diovan 160 Mg Tabs (Valsartan) .... One half by mouth once daily 9)  Ciprofloxacin Hcl 500 Mg Tabs (Ciprofloxacin hcl) .... Take one tab after sexual activity, then one 12 hrs later  Patient  Instructions: 1)  Please schedule a follow-up appointment in 6 months. 2)  BMP prior to visit, ICD-9:  401.9 3)  HbgA1C prior to visit, ICD-9:  250.00 4)  Urine Microalbumin prior to visit, ICD-9:  250.00 5)  Please return for lab work one (1) week before your next appointment.  6)  Follow up with your eye doctor for diabetic eye exam Prescriptions: METFORMIN HCL 500 MG TABS (METFORMIN HCL) 2 tabs by mouth bid  #360 x 3   Entered and Authorized by:   D. Thomos Lemons [name]   Signed by:   D. Thomos Lemons [name] on 02/24/2010   Method used:   Electronically to        [facility] [address] (retail)       [phone]       [place], [place]  [address]       Ph: [phone]       Fax: [phone]   RxID:   [id number]     Current Allergies (reviewed today): PENICILLIN G POTASSIUM (PENICILLIN G POTASSIUM)

## 2010-12-23 NOTE — Assessment & Plan Note (Signed)
Summary: tb test/mhf  Nurse Visit   Allergies: 1)  Penicillin G Potassium (Penicillin G Potassium)  Immunizations Administered:  PPD Skin Test:    Vaccine Type: PPD    Site: right forearm    Mfr: Sanofi Pasteur    Dose: 0.1 ml    Route: ID    Given by: Glendell Docker CMA    Exp. Date: 01/29/2012    Lot #: [id number]  Orders Added: 1)  TB Skin Test [86580] 2)  Admin 1st Vaccine [phone]

## 2010-12-23 NOTE — Progress Notes (Signed)
Summary: Swelling in Right Knee  Phone Note Call from Patient   Caller: Patient Call For: [name] Summary of Call: patient called and left voice message stating she is having swelling in her right knee  Initial call taken by: Glendell Docker CMA,  March 10, 2010 4:28 PM  Follow-up for Phone Call        call returned to patient at [phone], no answer, voice message left advising patient if swelling was not something that she has been seen for in the past, she would need to call back for evaluation, otherwise, additional information is needed regarding the swelling Follow-up by: Glendell Docker CMA,  March 10, 2010 4:31 PM

## 2010-12-23 NOTE — Progress Notes (Signed)
Summary: eye allergies  Phone Note Call from Patient Call back at [phone] or [phone]   Summary of Call: Patient left message c/o allergy issues. Per the patient she has taken clear eyes and another OTC eye drop to help with her eye allergy issues and has not received any relief from redness/itch. Please advise other drops patient can try. Initial call taken by: Lucious Groves,  Mar 24, 2010 8:40 AM  Follow-up for Phone Call        see rx Follow-up by: [name],  Mar 24, 2010 9:00 AM  Additional Follow-up for Phone Call Additional follow up Details #1::        Patient notified. Additional Follow-up by: Lucious Groves,  Mar 24, 2010 12:25 PM    New/Updated Medications: PATANOL 0.1 % SOLN (OLOPATADINE HCL) one drop to each eye daily for allergy symptoms Prescriptions: PATANOL 0.1 % SOLN (OLOPATADINE HCL) one drop to each eye daily for allergy symptoms  #1 bottle x 1   Entered and Authorized by:   [name]   Signed by:   [name] on 03/24/2010   Method used:   Electronically to        [facility] [address] (retail)       [phone]       [place], [place]  [address]       Ph: [phone]       Fax: [phone]   RxID:   [id number]

## 2010-12-23 NOTE — Progress Notes (Signed)
Summary: TB Results  Phone Note Call from Patient   Caller: Patient Call For: Roemello Speyer Summary of Call: the fax number to send her tb test results to is [phone] Initial call taken by: Roselle Locus,  January 31, 2010 1:22 PM  Follow-up for Phone Call        results faxed to [phone] per patient request , copy mailed to patient  Follow-up by: Glendell Docker CMA,  January 31, 2010 3:08 PM

## 2010-12-23 NOTE — Progress Notes (Signed)
Summary: Cipro Refill  Phone Note Call from Patient Call back at [phone]   Caller: Patient Call For: [name] Summary of Call: patient called and left voice message wanting to know if she could get a refill on the Cipro to take for preventing UTI Initial call taken by: Glendell Docker CMA,  May 14, 2010 9:42 AM  Follow-up for Phone Call        I suggest change to bactrim.  see rx.  this is one time rx. needs OV before refills Follow-up by: [name],  May 14, 2010 10:46 AM  Additional Follow-up for Phone Call Additional follow up Details #1::        call placed to patient at [phone], no answer,detailed voice message left for patient informing her per Dr Artist Pais instructions Additional Follow-up by: Glendell Docker CMA,  May 14, 2010 11:45 AM    New/Updated Medications: SULFAMETHOXAZOLE-TMP DS 800-160 MG TABS (SULFAMETHOXAZOLE-TRIMETHOPRIM) Take one as directed to prevent UTIs Prescriptions: SULFAMETHOXAZOLE-TMP DS 800-160 MG TABS (SULFAMETHOXAZOLE-TRIMETHOPRIM) Take one as directed to prevent UTIs  #10 x 0   Entered and Authorized by:   [name]   Signed by:   [name] on 05/14/2010   Method used:   Electronically to        [facility] [address] (retail)       [phone]       [place], [place]  [address]       Ph: [phone]       Fax: [phone]   RxID:   [id number]

## 2010-12-23 NOTE — Progress Notes (Signed)
Summary: Lab Results  Phone Note Outgoing Call   Summary of Call: call pt - u/a postive for UTI.  take cipro as directed.   OV needed if fever or back pain Initial call taken by: [name],  December 22, 2009 7:38 PM  Follow-up for Phone Call        attempted to contact patient at  [phone]  no answer no voice mail  patient called and left voice message this am stating her urine is cloudy and she is having pain with urination. Please return a call to [phone]-okay to leave message or.  Call was palced to [phone] no answer, message left with female individual to inform patient rx has been sent to the [facility] pharmacy on [address] Follow-up by: Glendell Docker CMA,  December 23, 2009 8:15 AM    New/Updated Medications: CIPROFLOXACIN HCL 500 MG TABS (CIPROFLOXACIN HCL) one by mouth bid Prescriptions: CIPROFLOXACIN HCL 500 MG TABS (CIPROFLOXACIN HCL) one by mouth bid  #10 x 0   Entered and Authorized by:   [name]   Signed by:   [name] on 12/22/2009   Method used:   Electronically to        [facility] [address] (retail)       [phone]       [place], [place]  [address]       Ph: [phone]       Fax: [phone]   RxID:   [id number]

## 2010-12-23 NOTE — Assessment & Plan Note (Signed)
Summary: 2 month follow up/mhf rsch per pt/dt   Vital Signs:  Patient profile:   65 year old female Height:      63.5 inches Weight:      154.75 pounds BMI:     27.08 O2 Sat:      100 % on Room air Temp:     97.8 degrees F oral Pulse rate:   61 / minute Pulse rhythm:   regular Resp:     16 per minute BP sitting:   122 / 80  (right arm) Cuff size:   regular  Vitals Entered By: Glendell Docker CMA (August 29, 2010 9:04 AM)  O2 Flow:  Room air  Contraindications/Deferment of Procedures/Staging:    Test/Procedure: PAP Smear    Reason for deferment: hysterectomy  CC: 2 month follow up Is Patient Diabetic? Yes Pain Assessment Patient in pain? no          Last PAP Result Hysterectomy   Primary Care Provider:  Dondra Spry DO  CC:  2 month follow up.  History of Present Illness: 65 y/o AA female c/o frontal headache feels sinus pressure also c/o post nasal gtt took tylenol sinus excedrin sinus and headache - some improvement no nausea  no photosensitivity severity 7 out of 10   DM II -  eating healthy  low blood sugar 79, high  138 avg 106-120   has not followed up with Dr in [place] for her knee- due to no insurance,  Preventive Screening-Counseling & Management  Alcohol-Tobacco     Smoking Status: quit  Allergies: 1)  Penicillin G Potassium (Penicillin G Potassium)  Past History:  Past Medical History: Diabetes mellitus type 2 Hyperlipidemia Hypertension     History of depression    DJD   Low back pain  Hx of recurrent carbuncle - MRSA      Past Surgical History: Breast Biopsy  Appendectomy  Hysterectomy Tubal ligation       Inguinal herniorrhaphy       Family History: Mother deceased age 62 secondary to brain aneurysm. Father deceased age 40 - liver cancer Grandmother with diabetes             Social History: Single Former Smoker  -  quit 10 to 11 years ago (light smoker) Alcohol use-yes   2 children  Occupation: [employer]  [employer]       Physical Exam  General:  alert, well-developed, and well-nourished.   Neck:  supple, no masses, and no carotid bruits.   Lungs:  normal respiratory effort and normal breath sounds.   Heart:  normal rate, regular rhythm, and no gallop.   Extremities:  No lower extremity edema  Diabetes Management Exam:    Foot Exam (with socks and/or shoes not present):       Inspection:          Left foot: normal          Right foot: normal   Impression & Recommendations:  Problem # 1:  HEADACHE (ICD-784.0) I suspect tension migraine.  I doubt sinusitis.  trial of muscle relaxer Patient advised to call office if symptoms persist or worsen.  The following medications were removed from the medication list:    Tramadol Hcl 50 Mg Tabs (Tramadol hcl) ..... One by mouth two times a day prn Her updated medication list for this problem includes:    Low-dose Aspirin 81 Mg Tabs (Aspirin) ..... One by mouth once daily  Hydrocodone-acetaminophen 5-325 Mg Tabs (Hydrocodone-acetaminophen) .[name]... Take 1- 2 tablets every 4 to 6 hours as needed for pain.  Problem # 2:  HYPERTENSION (ICD-401.9) Assessment: Unchanged  Her updated medication list for this problem includes:    Diovan 160 Mg Tabs (Valsartan) ..... One half by mouth once daily  BP today: 122/80 Prior BP: 110/70 (07/07/2010)  Labs Reviewed: K+: 4.2 (08/22/2010) Creat: : 0.8 (08/22/2010)   Chol: 188 (08/22/2010)   HDL: 64.60 (08/22/2010)   LDL: 111 (08/22/2010)   TG: 62.0 (08/22/2010)  Problem # 3:  DIABETES MELLITUS, TYPE II (ICD-250.00) Assessment: Unchanged  Her updated medication list for this problem includes:    Metformin Hcl 500 Mg Tabs (Metformin hcl) .[name]... 2 tabs by mouth bid    Low-dose Aspirin 81 Mg Tabs (Aspirin) ..... One by mouth once daily    Diovan 160 Mg Tabs (Valsartan) ..... One half by mouth once daily  Labs Reviewed: Creat: 0.8 (08/22/2010)    Reviewed HgBA1c results: 6.5 (08/22/2010)  6.8  (02/11/2010)  Complete Medication List: 1)  Folic Acid 1 Mg Tabs (Folic acid) .... Take 1 tablet by mouth once a day 2)  Metformin Hcl 500 Mg Tabs (Metformin hcl) .... 2 tabs by mouth bid 3)  Multivitamins Tabs (Multiple vitamin) .... One by mouth once daily 4)  Low-dose Aspirin 81 Mg Tabs (Aspirin) .... One by mouth once daily 5)  Caltrate 600+d 600-400 Mg-unit Tabs (Calcium carbonate-vitamin d) .... One by mouth once daily 6)  Fish Oil 1000 Mg Caps (Omega-3 fatty acids) .... Take 1 capsule by mouth once a day 7)  Simvastatin 40 Mg Tabs (Simvastatin) .... Take one tablet at bedtime 8)  Diovan 160 Mg Tabs (Valsartan) .... One half by mouth once daily 9)  Patanol 0.1 % Soln (Olopatadine hcl) .... One drop to each eye daily for allergy symptoms 10)  Hydrocodone-acetaminophen 5-325 Mg Tabs (Hydrocodone-acetaminophen) .... Take 1- 2 tablets every 4 to 6 hours as needed for pain. 11)  Accu-chek Aviva Strp (Glucose blood) .... Use to check blood sugar three times a day (250.00) 12)  Accu-chek Multiclix Lancets Misc (Lancets) .... Use to check blood sugar three times a day (250.00) 13)  Cyclobenzaprine Hcl 5 Mg Tabs (Cyclobenzaprine hcl) .... One by mouth qpm for headaches  Other Orders: Flu Vaccine 88yrs + ([address]) Admin 1st Vaccine (42595)  Patient Instructions: 1)  Please schedule a follow-up appointment in 4 months. 2)  BMP prior to visit, ICD-9:  401.9 3)  HbgA1C prior to visit, ICD-9:  250.00 4)  Please return for lab work one (1) week before your next appointment.  Prescriptions: DIOVAN 160 MG TABS (VALSARTAN) one half by mouth once daily  #90 x 1   Entered and Authorized by:   [name]   Signed by:   [name] on 08/29/2010   Method used:   Electronically to        [facility] [address] [place] (retail)       [phone]       [place], [place]  [address]       Ph: [phone]       Fax: [phone]   RxID:   [id number] SIMVASTATIN  40 MG TABS (SIMVASTATIN) Take one tablet at bedtime  #90 x 1   Entered and Authorized by:   [name]   Signed by:   [name] on 08/29/2010  Method used:   Electronically to        [facility] (retail)       [phone]       [place], [place]  [address]       Ph: [phone]       Fax: [phone]   RxID:   [id number] CYCLOBENZAPRINE HCL 5 MG TABS (CYCLOBENZAPRINE HCL) one by mouth qpm for headaches  #30 x 0   Entered and Authorized by:   [name]   Signed by:   [name] on 08/29/2010   Method used:   Electronically to        [facility] [address] [place] (retail)       [phone]       [place], [place]  [address]       Ph: [phone]       Fax: [phone]   RxID:   [id number]   Current Allergies (reviewed today): PENICILLIN G POTASSIUM (PENICILLIN G POTASSIUM)    Immunizations Administered:  Influenza Vaccine # 1:    Vaccine Type: Fluvax 3+    Site: left deltoid    Mfr: GlaxoSmithKline    Dose: 0.5 ml    Route: IM    Given by: Glendell Docker CMA    Exp. Date: 05/23/2011    Lot #: [id number]    VIS given: 06/17/10 version given August 29, 2010.  Flu Vaccine Consent Questions:    Do you have a history of severe allergic reactions to this vaccine? no    Any prior history of allergic reactions to egg and/or gelatin? no    Do you have a sensitivity to the preservative Thimersol? no    Do you have a past history of Guillan-Barre Syndrome? no    Do you currently have an acute febrile illness? no    Have you ever had a severe reaction to latex? no    Vaccine information given and explained to patient? yes    Are you currently pregnant? no    Preventive Care Screening  Pap Smear:    Date:  08/29/2010    Results:  Hysterectomy

## 2010-12-23 NOTE — Assessment & Plan Note (Signed)
Summary: READ TB TEST/MHF  Nurse Visit   Primary Care Provider:  Dondra Spry DO   History of Present Illness:  The patient presented after 48 hours to check the injection site for positive or negative reaction, Injections site examination : no firm bump forms at the site. Slightly reddish appearance and diameter was smaller than 5mm. Assessment : Negative  TB skin test. Patient was counseled to call if experience any irritation of site   Allergies: 1)  Penicillin G Potassium (Penicillin G Potassium)  PPD Results    Date of reading: 01/31/2010    Results: < 5mm    Interpretation: negative

## 2010-12-23 NOTE — Progress Notes (Signed)
Summary: metaxalone back order  Phone Note From Pharmacy   Caller: [facility] [address] [place] Call For: Dr Artist Pais  Summary of Call: Received fax from [facility] stating Cathy [name] is on manufacturer back order. Is there a substitute we can call in? Nicki Guadalajara Fergerson CMA Duncan Dull)  October 13, 2010 10:31 AM   Follow-up for Phone Call        use cyclobenzaprine.  see rx Follow-up by: [name],  October 13, 2010 1:08 PM  Additional Follow-up for Phone Call Additional follow up Details #1::        Left detailed message on pt's cell re: substitute for [facility] and to call if any questions. Left message on pharmacy voicemail re: substitution. Nicki Guadalajara Fergerson CMA Duncan Dull)  October 13, 2010 2:06 PM     New/Updated Medications: CYCLOBENZAPRINE HCL 5 MG TABS (CYCLOBENZAPRINE HCL) one by mouth once daily as needed Prescriptions: CYCLOBENZAPRINE HCL 5 MG TABS (CYCLOBENZAPRINE HCL) one by mouth once daily as needed  #30 x 0   Entered and Authorized by:   [name]   Signed by:   [name] on 10/13/2010   Method used:   Electronically to        [facility] [address] [place] (retail)       [phone]       [place], [place]  [address]       Ph: [phone]       Fax: [phone]   RxID:   [id number]

## 2010-12-23 NOTE — Assessment & Plan Note (Signed)
Summary: Boil on bottom/hea   Vital Signs:  Patient profile:   65 year old female Weight:      156.25 pounds BMI:     27.34 O2 Sat:      99 % on Room air Temp:     98.1 degrees F oral Pulse rate:   85 / minute Pulse rhythm:   regular Resp:     18 per minute BP sitting:   120 / 90  (right arm) Cuff size:   regular  Vitals Entered By: Glendell Docker CMA (Apr 07, 2010 1:44 PM)  O2 Flow:  Room air CC: Rm 3- Evaluation of Boil on buttock   Primary Care Provider:  DThomos Lemons DO  CC:  Rm 3- Evaluation of Boil on buttock.  History of Present Illness: 66 y/o AA female complains of large boil on left buttock symptoms started last week - last thursday, or wed she has tried self care measures - sitz bath and took left over Cipro from prev UTI abscess is draining some bloody purulent material she denies fever or chills   Allergies: 1)  Penicillin G Potassium (Penicillin G Potassium)  Past History:  Past Medical History: Diabetes mellitus type 2 Hyperlipidemia Hypertension   History of depression    DJD   Low back pain  Carbuncle - MRSA     Family History: Mother deceased age 63 secondary to brain aneurysm. Father deceased age 72 - liver cancer Grandmother with diabetes          Social History: Single Former Smoker  -  quit 10 to 11 years ago (light smoker) Alcohol use-yes   2 children Occupation: works at a prison         Physical Exam  General:  alert, well-developed, and well-nourished.   Lungs:  normal respiratory effort and normal breath sounds.   Heart:  normal rate, regular rhythm, and no gallop.   Skin:  large abscess on left lower buttock - draining bloody purulent fluid.  area is tender Psych:  normally interactive and good eye contact.     Impression & Recommendations:  Problem # 1:  CELLULITIS AND ABSCESS OF BUTTOCK (ICD-682.5) 65 y/o AA female with large 3-4 cm abscess on left buttock.  she has hx of MRSA.  wound culture obtained.  she will  need surgical drainage.  arrange surgical consultation.  use both clinda and bactrim   The following medications were removed from the medication list:    Ciprofloxacin Hcl 500 Mg Tabs (Ciprofloxacin hcl) .[name]... Take one tab after sexual activity, then one 12 hrs later Her updated medication list for this problem includes:    Clindamycin Hcl 150 Mg Caps (Clindamycin hcl) ..... One cap three times a day    Sulfamethoxazole-tmp Ds 800-160 Mg Tabs (Sulfamethoxazole-trimethoprim) ..... One by mouth two times a day  Complete Medication List: 1)  Folic Acid 1 Mg Tabs (Folic acid) .... Take 1 tablet by mouth once a day 2)  Metformin Hcl 500 Mg Tabs (Metformin hcl) .... 2 tabs by mouth bid 3)  Multivitamins Tabs (Multiple vitamin) .... One by mouth once daily 4)  Low-dose Aspirin 81 Mg Tabs (Aspirin) .... One by mouth once daily 5)  Caltrate 600+d 600-400 Mg-unit Tabs (Calcium carbonate-vitamin d) .... One by mouth once daily 6)  Fish Oil 500 Mg Caps (Omega-3 fatty acids) .... One by mouth once daily 7)  Simvastatin 40 Mg Tabs (Simvastatin) .... Take one tablet at bedtime 8)  Diovan 160 Mg  Tabs (Valsartan) .... One half by mouth once daily 9)  Patanol 0.1 % Soln (Olopatadine hcl) .... One drop to each eye daily for allergy symptoms 10)  Clindamycin Hcl 150 Mg Caps (Clindamycin hcl) .... One cap three times a day 11)  Sulfamethoxazole-tmp Ds 800-160 Mg Tabs (Sulfamethoxazole-trimethoprim) .... One by mouth two times a day  Other Orders: T-Culture, Wound ([id number]) Specimen Handling ([id number])  Patient Instructions: 1)  Our offcie will contact you re:  surgical referral 2)  Call our office if you develop fever or chills Prescriptions: SULFAMETHOXAZOLE-TMP DS 800-160 MG TABS (SULFAMETHOXAZOLE-TRIMETHOPRIM) one by mouth two times a day  #20 x 0   Entered and Authorized by:   [name]   Signed by:   [name] on 04/07/2010   Method used:   Electronically to        [facility] [address] [place] (retail)       [phone]       [place], [place]  [address]       Ph: [phone]       Fax: [phone]   RxID:   [id number] CLINDAMYCIN HCL 150 MG CAPS (CLINDAMYCIN HCL) one cap three times a day  #30 x 0   Entered and Authorized by:   [name]   Signed by:   [name] on 04/07/2010   Method used:   Electronically to        [facility] [address] [place] (retail)       [phone]       [place], [place]  [address]       Ph: [phone]       Fax: [phone]   RxID:   [id number]   Current Allergies (reviewed today): PENICILLIN G POTASSIUM (PENICILLIN G POTASSIUM)

## 2010-12-23 NOTE — Assessment & Plan Note (Signed)
Summary: uti/mhf   Vital Signs:  Patient profile:   65 year old female Height:      63.5 inches Weight:      157.25 pounds BMI:     27.52 O2 Sat:      97 % on Room air Temp:     97.8 degrees F oral Pulse rate:   89 / minute Pulse rhythm:   irregular Resp:     20 per minute BP sitting:   140 / 80  (right arm) Cuff size:   regular  Vitals Entered By: Glendell Docker CMA (February 11, 2010 11:37 AM)  O2 Flow:  Room air CC: Rm 3- UTI & Follow up disease management, Dysuria Comments c/o tingling in right hand, and right leg pain, presuure with voiding, and urine frequency onset Friday, c/o problems with sinus, low blood sugar 82, high 125, avg 90-100   Primary Care Provider:  Dondra Spry DO  CC:  Rm 3- UTI & Follow up disease management and Dysuria.  History of Present Illness:  Dysuria      This is a 65 year old woman who presents with Dysuria.  The patient reports burning with urination, urinary frequency, and urgency.  Associated symptoms include back pain.  The patient denies the following associated symptoms: nausea, vomiting, fever, and shaking chills.  Risk factors for urinary tract infection include diabetes.      History is significant for > 3 UTIs in one year.    UTI freq occur after sexual activity  DM II - she is taking her meds.  her glucometer broke  Allergies: 1)  Penicillin G Potassium (Penicillin G Potassium)  Past History:  Past Medical History: Diabetes mellitus type 2 Hyperlipidemia Hypertension  History of depression  DJD   Low back pain  Carbuncle - MRSA     Past Surgical History: Breast Biopsy Appendectomy  Hysterectomy Tubal ligation   Inguinal herniorrhaphy       Family History: Mother deceased age 43 secondary to brain aneurysm. Father deceased age 36 - liver cancer Grandmother with diabetes       Social History: Single Former Smoker  -  quit 10 to 11 years ago (light smoker) Alcohol use-yes  2 children Occupation: works at a  prison      Physical Exam  General:  alert, well-developed, and well-nourished.   Lungs:  normal respiratory effort and normal breath sounds.   Heart:  normal rate, regular rhythm, no murmur, and no gallop.   Abdomen:  soft.  mild suprapubic tenderness,  no cva tenderness no flank tenderness Extremities:  No lower extremity edema    Impression & Recommendations:  Problem # 1:  UTI (ICD-599.0) 65 y/o AA female with freq UTI.   UTI often occurs after sexual activity.  use cipro after sexual activity pt advised to void and increase fluids after sexual activity  Her updated medication list for this problem includes:    Ciprofloxacin Hcl 500 Mg Tabs (Ciprofloxacin hcl) ..... One by mouth bid    Ciprofloxacin Hcl 500 Mg Tabs (Ciprofloxacin hcl) .[name]... Take one tab after sexual activity, then one 12 hrs later  Orders: UA Dipstick w/o Micro (manual) ([address]) T-Culture, Urine ([id number]) Specimen Handling (99000)  Problem # 2:  HYPERTENSION (ICD-401.9) stable.  Maintain current medication regimen.  Her updated medication list for this problem includes:    Diovan 160 Mg Tabs (Valsartan) ..... One half by mouth once daily  Orders: T-Basic Metabolic Panel [phone])  BP today: 140/80  Prior BP: 106/60 (10/05/2009)  Labs Reviewed: K+: 4.5 (07/31/2009) Creat: : 0.87 (07/31/2009)   Chol: 194 (07/31/2009)   HDL: 66 (07/31/2009)   LDL: 108 (07/31/2009)   TG: 101 (07/31/2009)  Problem # 3:  DIABETES MELLITUS, TYPE II (ICD-250.00) Stable.  monitor A1c. Her updated medication list for this problem includes:    Metformin Hcl 750 Mg Xr24h-tab (Metformin hcl) .[name]... Take 1 tablet by mouth two times a day    Low-dose Aspirin 81 Mg Tabs (Aspirin) ..... One by mouth once daily    Diovan 160 Mg Tabs (Valsartan) ..... One half by mouth once daily  Orders: T- Hemoglobin A1C ([id number])  Complete Medication List: 1)  Folic Acid 1 Mg Tabs (Folic acid) .... Take 1 tablet by mouth once a  day 2)  Metformin Hcl 750 Mg Xr24h-tab (Metformin hcl) .... Take 1 tablet by mouth two times a day 3)  Multivitamins Tabs (Multiple vitamin) .... One by mouth once daily 4)  Low-dose Aspirin 81 Mg Tabs (Aspirin) .... One by mouth once daily 5)  Caltrate 600+d 600-400 Mg-unit Tabs (Calcium carbonate-vitamin d) .... One by mouth once daily 6)  Fish Oil 500 Mg Caps (Omega-3 fatty acids) .... One by mouth once daily 7)  Simvastatin 40 Mg Tabs (Simvastatin) .... Take one tablet at bedtime 8)  Diovan 160 Mg Tabs (Valsartan) .... One half by mouth once daily 9)  Tizanidine Hcl 4 Mg Tabs (Tizanidine hcl) .[name].. 1 by mouth at bedtime as needed back pain 10)  Ciprofloxacin Hcl 500 Mg Tabs (Ciprofloxacin hcl) .... One by mouth bid 11)  Ciprofloxacin Hcl 500 Mg Tabs (Ciprofloxacin hcl) .... Take one tab after sexual activity, then one 12 hrs later  Patient Instructions: 1)  Call our office if your symptoms do not  improve or gets worse. 2)  Please schedule a follow-up appointment in 6 months. Prescriptions: CIPROFLOXACIN HCL 500 MG TABS (CIPROFLOXACIN HCL) take one tab after sexual activity, then one 12 hrs later  #20 x 0   Entered and Authorized by:   [name]   Signed by:   [name] on 02/11/2010   Method used:   Print then Give to Patient   RxID:   [id number] CIPROFLOXACIN HCL 500 MG TABS (CIPROFLOXACIN HCL) one by mouth bid  #14 x 0   Entered and Authorized by:   [name]   Signed by:   [name] on 02/11/2010   Method used:   Electronically to        [facility] [address] [place] (retail)       [phone]       [place], [place]  [address]       Ph: [phone]       Fax: [phone]   RxID:   [id number]   Laboratory Results   Urine Tests    Routine Urinalysis   Color: straw Appearance: Cloudy Glucose: negative   (Normal Range: Negative) Bilirubin: negative   (Normal Range: Negative) Ketone: negative   (Normal  Range: Negative) Spec. Gravity: 1.010   (Normal Range: 1.003-1.035) Blood: small   (Normal Range: Negative) pH: 6.0   (Normal Range: 5.0-8.0) Protein: 100   (Normal Range: Negative) Urobilinogen: 0.2   (Normal Range: 0-1) Nitrite: positive   (Normal Range: Negative) Leukocyte Esterace: large   (Normal Range: Negative)          Current Allergies (reviewed today): PENICILLIN  G POTASSIUM (PENICILLIN G POTASSIUM)

## 2010-12-23 NOTE — Progress Notes (Signed)
Summary: BACK HURTS GOING TO THE BATHROOM A LOT --lm  Phone Note Call from Patient Call back at [phone]   Reason for Call: Lab or Test Results Summary of Call: SHE IS HAVING PROBLEMS WITH GOING TO THE BATHROOM ALOT ( URINATION)AND HER BACK HIS HURTING.   [facility] [address] SHE WOULD LIKE SOMETHING CALLED IN  Initial call taken by: Roselle Locus,  February 10, 2010 8:21 AM  Follow-up for Phone Call        Left message on machine to return call. Pt needs appt. today to check her urine. Tf,cma  Additional Follow-up for Phone Call Additional follow up Details #1::        [phone], call patient back and ket her know the status. She states she can go to [facility] and leave a urine sample if that is okay? call her back and let her know what to do. Thank you.Michaelle Copas  February 10, 2010 12:23 PM     Additional Follow-up for Phone Call Additional follow up Details #2::    we can not keep calling in abx w/o seeing pt. Pt needs appt today or she can go to urgent care Follow-up by: [name],  February 10, 2010 1:13 PM  Additional Follow-up for Phone Call Additional follow up Details #3:: Details for Additional Follow-up Action Taken: call placed to patient [phone] voice message left informing patient office visit was needed Additional Follow-up by: Glendell Docker CMA,  February 10, 2010 1:57 PM

## 2010-12-23 NOTE — Letter (Signed)
Summary: Work Dietitian at [employer]. [address]   [place], [place] [address]   Phone: [phone]  Fax: [phone]      Today's Date: June 23, 2010    Name of Patient: Cathy [name]   The above named patient had a medical visit today .Please take this into consideration when reviewing the time away from work.  Special Instructions:  Arly.Keller ] None  [  ] To be off the remainder of today, returning to the normal work / school schedule tomorrow.  [  ] To be off until the next scheduled appointment on ______________________.  [  ] Other ________________________________________________________________ ________________________________________________________________________    Sincerely yours,   Glendell Docker CMA Dr Thomos Lemons

## 2010-12-23 NOTE — Progress Notes (Signed)
Summary: another round of antibiotics for UTI   Phone Note Call from Patient Call back at [facility] Phone [phone] Call back at [phone]   Caller: pt live Call For: yoo  Summary of Call: she is still having pain and burning when she urinates.  Does Dr Artist Pais want her to  take another round of antibiotic.  Nechama Guard  Initial call taken by: Roselle Locus,  January 01, 2010 4:03 PM  Follow-up for Phone Call        I suggest OV and pt needs to provided repeat urine sample Follow-up by: [name],  January 02, 2010 12:18 PM  Additional Follow-up for Phone Call Additional follow up Details #1::        Left a detailed mess. for pt. to call our office to schedule a OV and to give a repeat urine sample.Michaelle Copas  January 03, 2010 9:07 AM

## 2010-12-23 NOTE — Assessment & Plan Note (Signed)
Summary: 1 week follow up/mhf   Vital Signs:  Patient profile:   65 year old female Height:      63.5 inches Weight:      149.50 pounds BMI:     26.16 O2 Sat:      99 % on Room air Temp:     98.4 degrees F oral Pulse rate:   102 / minute Pulse rhythm:   irregular Resp:     18 per minute BP sitting:   140 / 70  (right arm) Cuff size:   regular  Vitals Entered By: Glendell Docker CMA (June 23, 2010 10:31 AM)  O2 Flow:  Room air CC: Rm 2- 1 Week Follow up Is Patient Diabetic? Yes Pain Assessment Patient in pain? no      Comments recheck of boil   Primary Care Provider:  DThomos Lemons DO  CC:  Rm 2- 1 Week Follow up.  History of Present Illness: 65 y/o AA female for f/u re:  boil area looks much better.  no swelling or redness break in skin still not completely healed no side effects from abx  Allergies: 1)  Penicillin G Potassium (Penicillin G Potassium)  Past History:  Past Medical History: Diabetes mellitus type 2 Hyperlipidemia Hypertension    History of depression    DJD   Low back pain  Carbuncle - MRSA     Past Surgical History: Breast Biopsy Appendectomy  Hysterectomy Tubal ligation      Inguinal herniorrhaphy       Family History: Mother deceased age 56 secondary to brain aneurysm. Father deceased age 63 - liver cancer Grandmother with diabetes           Social History: Single Former Smoker  -  quit 10 to 11 years ago (light smoker) Alcohol use-yes   2 children  Occupation: works at a prison         Physical Exam  General:  alert, well-developed, and well-nourished.   Msk:  mild right knee crepitus. no joint warmth or swelling,  no joint warmth, no redness over joints, and no joint instability.   Skin:  left boil sign improve.  no redness, tenderness or inderation.  open wound from I & D not completely healed   Impression & Recommendations:  Problem # 1:  CELLULITIS AND ABSCESS OF BUTTOCK (ICD-682.5) Assessment Improved no  inderation or drainage.  pt advised to keep area covered for at 1 week until open wound fully healed Patient advised to call office if symptoms persist or worsen.  The following medications were removed from the medication list:    Clindamycin Hcl 150 Mg Caps (Clindamycin hcl) ..... One by mouth tid    Smz-tmp Ds 800-160 Mg Tabs (Sulfamethoxazole-trimethoprim) ..... One by mouth two times a day  Problem # 2:  KNEE PAIN, RIGHT (ICD-719.46)  Her updated medication list for this problem includes:    Low-dose Aspirin 81 Mg Tabs (Aspirin) ..... One by mouth once daily    Hydrocodone-acetaminophen 5-325 Mg Tabs (Hydrocodone-acetaminophen) .[name]... Take 1- 2 tablets every 4 to 6 hours as needed for pain.  Complete Medication List: 1)  Folic Acid 1 Mg Tabs (Folic acid) .... Take 1 tablet by mouth once a day 2)  Metformin Hcl 500 Mg Tabs (Metformin hcl) .... 2 tabs by mouth bid 3)  Multivitamins Tabs (Multiple vitamin) .... One by mouth once daily 4)  Low-dose Aspirin 81 Mg Tabs (Aspirin) .... One by mouth once daily 5)  Caltrate  600+d 600-400 Mg-unit Tabs (Calcium carbonate-vitamin d) .... One by mouth once daily 6)  Fish Oil 1000 Mg Caps (Omega-3 fatty acids) .... Take 1 capsule by mouth once a day 7)  Simvastatin 40 Mg Tabs (Simvastatin) .... Take one tablet at bedtime 8)  Diovan 160 Mg Tabs (Valsartan) .... One half by mouth once daily 9)  Patanol 0.1 % Soln (Olopatadine hcl) .... One drop to each eye daily for allergy symptoms 10)  Hydrocodone-acetaminophen 5-325 Mg Tabs (Hydrocodone-acetaminophen) .... Take 1- 2 tablets every 4 to 6 hours as needed for pain. 11)  Accu-chek Aviva Strp (Glucose blood) .... Use to check blood sugar three times a day (250.00) 12)  Accu-chek Multiclix Lancets Misc (Lancets) .... Use to check blood sugar three times a day (250.00)  Patient Instructions: 1)  Please schedule a follow-up appointment in 2 months. 2)  BMP prior to visit, ICD-9:  401.9 3)  HbgA1C prior  to visit, ICD-9: 790.29 4)  Hepatic Panel prior to visit, ICD-9: 272.4 5)  Lipid Panel prior to visit, ICD-9:272.4 6)  Please return for lab work one (1) week before your next appointment.  Prescriptions: ACCU-CHEK AVIVA  STRP (GLUCOSE BLOOD) use to check blood sugar three times a day (250.00)  #100 x 3   Entered by:   Glendell Docker CMA   Authorized by:   [name]   Signed by:   Glendell Docker CMA on 06/23/2010   Method used:   Electronically to        [facility] [address] (retail)       [phone]       [place], [place]  [address]       Ph: [phone]       Fax: [phone]   RxID:   [id number] ACCU-CHEK MULTICLIX LANCETS  MISC (LANCETS) use to check blood sugar three times a day (250.00)  #100 x 3   Entered by:   Glendell Docker CMA   Authorized by:   [name]   Signed by:   Glendell Docker CMA on 06/23/2010   Method used:   Electronically to        [facility] [address] (retail)       [phone]       [place], [place]  [address]       Ph: [phone]       Fax: [phone]   RxID:   [id number]   Current Allergies (reviewed today): PENICILLIN G POTASSIUM (PENICILLIN G POTASSIUM)

## 2010-12-23 NOTE — Assessment & Plan Note (Signed)
Summary: BACK PAIN/MHF   Vital Signs:  Patient profile:   65 year old female Weight:      153.75 pounds BMI:     26.91 O2 Sat:      100 % on Room air Temp:     98.0 degrees F oral Pulse rate:   67 / minute Pulse rhythm:   irregular Resp:     16 per minute BP sitting:   110 / 70  (right arm) Cuff size:   regular  Vitals Entered By: Glendell Docker CMA (July 07, 2010 8:15 AM)  O2 Flow:  Room air CC: Back Pain Is Patient Diabetic? Yes Did you bring your meter with you today? No Pain Assessment Patient in pain? yes     Location: lower back Intensity: 8 Type: aching Onset of pain  Constant Comments low back pain  for the past week. and ,c/o right knee burning    Primary Care Provider:  Dondra Spry DO  CC:  Back Pain.  History of Present Illness:  65 y/o AA female c/o low back pain - hx of falling on tail bone 2001 pt told at [facility] ER she has arthritis in her back and possbile bulging disc no recent injury or trauma,  not sure why back is bothering her more than usual pain across lower back no aggravating position no radiation,  no lower weakness no urinary complaints no improvement with OTC ibuprofen   medial side of knee - notes burning sensation no recent trauma  Preventive Screening-Counseling & Management  Alcohol-Tobacco     Smoking Status: quit  Allergies: 1)  Penicillin G Potassium (Penicillin G Potassium)  Past History:  Past Medical History: Diabetes mellitus type 2 Hyperlipidemia Hypertension    History of depression    DJD   Low back pain  Carbuncle - MRSA      Past Surgical History: Breast Biopsy  Appendectomy  Hysterectomy Tubal ligation      Inguinal herniorrhaphy       Family History: Mother deceased age 41 secondary to brain aneurysm. Father deceased age 69 - liver cancer Grandmother with diabetes            Social History: Single Former Smoker  -  quit 10 to 11 years ago (light smoker) Alcohol use-yes   2 children    Occupation: [employer]      Physical Exam  General:  alert, well-developed, and well-nourished.   Lungs:  normal respiratory effort and normal breath sounds.   Heart:  normal rate, regular rhythm, and no gallop.   Msk:  mild crepitus right knee,  no joint instability.  right lower ext varicosities   mild decrease in ROM of lumbar spine.   Extremities:  No lower extremity edema  Neurologic:  cranial nerves II-XII intact, strength normal in all extremities, gait normal, and DTRs symmetrical and normal.     Impression & Recommendations:  Problem # 1:  BACK PAIN (ICD-724.5) intermittent low back pain likely due to DDD / spondylosis.  use tramadol and muscle relaxers as directed.  she declines PT referral  Her updated medication list for this problem includes:    Low-dose Aspirin 81 Mg Tabs (Aspirin) ..... One by mouth once daily    Hydrocodone-acetaminophen 5-325 Mg Tabs (Hydrocodone-acetaminophen) .[name]... Take 1- 2 tablets every 4 to 6 hours as needed for pain.    Tramadol Hcl 50 Mg Tabs (Tramadol hcl) ..... One by mouth two times a day prn  Metaxalone 800 Mg Tabs (Metaxalone) ..... One by mouth three times a day as needed for low back pain  Orders: UA Dipstick w/o Micro (manual) ([address])  Problem # 2:  KNEE PAIN, RIGHT (ICD-719.46)  pt notes intermittent burning sensation right medial knee.  possible knee bursitis.  refer to [facility].  Her updated medication list for this problem includes:    Low-dose Aspirin 81 Mg Tabs (Aspirin) ..... One by mouth once daily    Hydrocodone-acetaminophen 5-325 Mg Tabs (Hydrocodone-acetaminophen) .[name]... Take 1- 2 tablets every 4 to 6 hours as needed for pain.    Tramadol Hcl 50 Mg Tabs (Tramadol hcl) ..... One by mouth two times a day prn    Metaxalone 800 Mg Tabs (Metaxalone) ..... One by mouth three times a day as needed for low back pain  Orders: Orthopedic Referral (Ortho)  Complete Medication List: 1)  Folic Acid 1 Mg Tabs  (Folic acid) .... Take 1 tablet by mouth once a day 2)  Metformin Hcl 500 Mg Tabs (Metformin hcl) .... 2 tabs by mouth bid 3)  Multivitamins Tabs (Multiple vitamin) .... One by mouth once daily 4)  Low-dose Aspirin 81 Mg Tabs (Aspirin) .... One by mouth once daily 5)  Caltrate 600+d 600-400 Mg-unit Tabs (Calcium carbonate-vitamin d) .... One by mouth once daily 6)  Fish Oil 1000 Mg Caps (Omega-3 fatty acids) .... Take 1 capsule by mouth once a day 7)  Simvastatin 40 Mg Tabs (Simvastatin) .... Take one tablet at bedtime 8)  Diovan 160 Mg Tabs (Valsartan) .... One half by mouth once daily 9)  Patanol 0.1 % Soln (Olopatadine hcl) .... One drop to each eye daily for allergy symptoms 10)  Hydrocodone-acetaminophen 5-325 Mg Tabs (Hydrocodone-acetaminophen) .... Take 1- 2 tablets every 4 to 6 hours as needed for pain. 11)  Accu-chek Aviva Strp (Glucose blood) .... Use to check blood sugar three times a day (250.00) 12)  Accu-chek Multiclix Lancets Misc (Lancets) .... Use to check blood sugar three times a day (250.00) 13)  Tramadol Hcl 50 Mg Tabs (Tramadol hcl) .... One by mouth two times a day prn 14)  Metaxalone 800 Mg Tabs (Metaxalone) .... One by mouth three times a day as needed for low back pain  Patient Instructions: 1)  Call our office if your symptoms do not  improve or gets worse. Prescriptions: METAXALONE 800 MG TABS (METAXALONE) one by mouth three times a day as needed for low back pain  #30 x 0   Entered and Authorized by:   [name]   Signed by:   [name] on 07/07/2010   Method used:   Electronically to        [facility] [address] (retail)       [phone]       [place], [place]  [address]       Ph: [phone]       Fax: [phone]   RxID:   [id number] TRAMADOL HCL 50 MG TABS (TRAMADOL HCL) one by mouth two times a day prn  #30 x 0   Entered and Authorized by:   [name]   Signed by:   [name] on  07/07/2010   Method used:   Electronically to        [facility] [address] (retail)       [phone]  [place], [place]  [address]       Ph: [phone]       Fax: [phone]   RxID:   [id number]   Current Allergies (reviewed today): PENICILLIN G POTASSIUM (PENICILLIN G POTASSIUM)  Laboratory Results   Urine Tests    Routine Urinalysis   Color: yellow Appearance: Clear Glucose: negative   (Normal Range: Negative) Bilirubin: negative   (Normal Range: Negative) Ketone: negative   (Normal Range: Negative) Spec. Gravity: <1.005   (Normal Range: 1.003-1.035) Blood: trace-lysed   (Normal Range: Negative) pH: 5.0   (Normal Range: 5.0-8.0) Protein: negative   (Normal Range: Negative) Urobilinogen: 0.2   (Normal Range: 0-1) Nitrite: negative   (Normal Range: Negative) Leukocyte Esterace: negative   (Normal Range: Negative)

## 2010-12-23 NOTE — Progress Notes (Signed)
Summary: Work excuse  Phone Note Call from Patient Call back at [phone] fax   Caller: Patient Summary of Call: Pt requesting letter for her job to excuse her from work today, please fax to [phone] Initial call taken by: Lannette Donath,  June 23, 2010 1:20 PM  Follow-up for Phone Call        letter printed and faxed to [phone], hard copy mailed to patients address on file Follow-up by: Glendell Docker CMA,  June 23, 2010 2:04 PM

## 2010-12-23 NOTE — Progress Notes (Signed)
Summary: Alternative Medication  Phone Note Call from Patient Call back at Home Phone [phone] Call back at [phone]   Caller: Patient Summary of Call: patient called and left voice message stating the medications that were sent to the pharmacy today for her back she is unable to afford the medication. She states her copay is 104. She would like to know if there is something that could be prescribed that is less expensive Initial call taken by: Glendell Docker CMA,  July 07, 2010 2:25 PM  Follow-up for Phone Call        just try tramadol along with 500 mg of tylenol as discussed.  I doubt tramadol cost prohibitive Follow-up by: [name],  July 07, 2010 2:51 PM  Additional Follow-up for Phone Call Additional follow up Details #1::        call returned to patient at [phone], no answer. A detailed voice message was left informing patient per Dr Artist Pais instructions. She was advised to call if any questions Additional Follow-up by: Glendell Docker CMA,  July 07, 2010 5:28 PM

## 2010-12-23 NOTE — Letter (Signed)
   [facility] [address] [place], [place]  [address]  [place] Phone: [phone]      February 13, 2010   Amorina Weil [address] [place], [place] [address]  RE:  LAB RESULTS  Dear  Ms. Ploch,  The following is an interpretation of your most recent lab tests.  Please take note of any instructions provided or changes to medications that have resulted from your lab work.  ELECTROLYTES:  Good - no changes needed    DIABETIC STUDIES:  Fair - schedule a follow-up appointment Blood Glucose: 92   HgbA1C: 6.8   Microalbumin/Creatinine Ratio: 8.2      I will further discuss your lab results at your next follow up appointment.     Sincerely Yours,    Dr. Thomos Lemons

## 2010-12-23 NOTE — Progress Notes (Signed)
Summary: UTI  Phone Note Call from Patient Call back at [phone]   Caller: Patient Call For: [name] Summary of Call: patient called and left voice message stating she is having urinary frequency and she knows that she has a UTI, she would like to know if Dr Artist Pais would prescribe something for her to take. Her message state she is unable to take off of work until Friday of next week. If approved she would like the rx sent to the [facility] on [address] [facility] Initial call taken by: Glendell Docker CMA,  October 14, 2010 4:34 PM  Follow-up for Phone Call        pt needs to drop off urine sample Follow-up by: [name],  October 14, 2010 5:25 PM  Additional Follow-up for Phone Call Additional follow up Details #1::        call placed to patient at [phone], no answer. A detailed voice message was left informing patient  a urine specimen was needed. She was advised an order was sent to lab and she could drop specimen off at her convinence. Message was left for to call if any questions Additional Follow-up by: Glendell Docker CMA,  October 15, 2010 8:32 AM

## 2010-12-25 NOTE — Assessment & Plan Note (Signed)
Summary: UTI/DLO   Vital Signs:  Patient profile:   65 year old female Weight:      155 pounds BMI:     27.12 Temp:     96.9 degrees F oral BP sitting:   104 / 70  (left arm)  Vitals Entered By: Doristine Devoid CMA (November 08, 2010 9:12 AM) CC: uti sx and sinus congestion    History of Present Illness: Patient seen for the following:  2-day history of mild burning with urination. No back pain, fever, vaginal discharge, nausea, or vomiting. She had one leftover Bactrim double strength and took with some relief of symptoms. No gross hematuria Type 2 diabetic controlled.  2-day history left frontal sinus pressure. No postnasal drainage. No purulent nasal discharge. Denies cough or sore throat.  Current Medications (verified): 1)  Folic Acid 1 Mg Tabs (Folic Acid) .... Take 1 Tablet By Mouth Once A Day 2)  Metformin Hcl 500 Mg Tabs (Metformin Hcl) .... 2 Tabs By Mouth Bid 3)  Multivitamins   Tabs (Multiple Vitamin) .... One By Mouth Once Daily 4)  Low-Dose Aspirin 81 Mg  Tabs (Aspirin) .... One By Mouth Once Daily 5)  Caltrate 600+d 600-400 Mg-Unit  Tabs (Calcium Carbonate-Vitamin D) .... One By Mouth Once Daily 6)  Fish Oil 1000 Mg Caps (Omega-3 Fatty Acids) .... Take 1 Capsule By Mouth Once A Day 7)  Simvastatin 40 Mg Tabs (Simvastatin) .... Take One Tablet At Bedtime 8)  Diovan 160 Mg Tabs (Valsartan) .... One Half By Mouth Once Daily 9)  Patanol 0.1 % Soln (Olopatadine Hcl) .... One Drop To Each Eye Daily For Allergy Symptoms 10)  Hydrocodone-Acetaminophen 5-325 Mg Tabs (Hydrocodone-Acetaminophen) .... Take 1- 2 Tablets Every 4 To 6 Hours As Needed For Pain. 11)  Accu-Chek Aviva  Strp (Glucose Blood) .... Use To Check Blood Sugar Three Times A Day (250.00) 12)  Accu-Chek Multiclix Lancets  Misc (Lancets) .... Use To Check Blood Sugar Three Times A Day (250.00) 13)  Cyclobenzaprine Hcl 5 Mg Tabs (Cyclobenzaprine Hcl) .... One By Mouth Once Daily As Needed  Allergies  (verified): 1)  Penicillin G Potassium (Penicillin G Potassium)  Past History:  Past Medical History: Last updated: 08/29/2010 Diabetes mellitus type 2 Hyperlipidemia Hypertension     History of depression    DJD   Low back pain  Hx of recurrent carbuncle - MRSA     PMH reviewed for relevance  Review of Systems      See HPI  Physical Exam  General:  Well-developed,well-nourished,in no acute distress; alert,appropriate and cooperative throughout examination Ears:  External ear exam shows no significant lesions or deformities.  Otoscopic examination reveals clear canals, tympanic membranes are intact bilaterally without bulging, retraction, inflammation or discharge. Hearing is grossly normal bilaterally. Nose:  External nasal examination shows no deformity or inflammation. Nasal mucosa are pink and moist without lesions or exudates. Mouth:  Oral mucosa and oropharynx without lesions or exudates.  Teeth in good repair. Neck:  No deformities, masses, or tenderness noted. Lungs:  Normal respiratory effort, chest expands symmetrically. Lungs are clear to auscultation, no crackles or wheezes. Heart:  normal rate and regular rhythm.   Msk:  no CVA tenderness.   Impression & Recommendations:  Problem # 1:  DYSURIA (ICD-788.1)  ?partially treated UTI.  Complete 7 days course of Bactrim DS. Orders: UA Dipstick w/o Micro (manual) ([address])  Her updated medication list for this problem includes:    Bactrim Ds 800-160 Mg Tabs (Sulfamethoxazole-trimethoprim) .Marland KitchenMarland KitchenMarland KitchenMarland Kitchen  One by mouth two times a day for 7 days  Complete Medication List: 1)  Folic Acid 1 Mg Tabs (Folic acid) .... Take 1 tablet by mouth once a day 2)  Metformin Hcl 500 Mg Tabs (Metformin hcl) .... 2 tabs by mouth bid 3)  Multivitamins Tabs (Multiple vitamin) .... One by mouth once daily 4)  Low-dose Aspirin 81 Mg Tabs (Aspirin) .... One by mouth once daily 5)  Caltrate 600+d 600-400 Mg-unit Tabs (Calcium carbonate-vitamin d)  .... One by mouth once daily 6)  Fish Oil 1000 Mg Caps (Omega-3 fatty acids) .... Take 1 capsule by mouth once a day 7)  Simvastatin 40 Mg Tabs (Simvastatin) .... Take one tablet at bedtime 8)  Diovan 160 Mg Tabs (Valsartan) .... One half by mouth once daily 9)  Patanol 0.1 % Soln (Olopatadine hcl) .... One drop to each eye daily for allergy symptoms 10)  Hydrocodone-acetaminophen 5-325 Mg Tabs (Hydrocodone-acetaminophen) .... Take 1- 2 tablets every 4 to 6 hours as needed for pain. 11)  Accu-chek Aviva Strp (Glucose blood) .... Use to check blood sugar three times a day (250.00) 12)  Accu-chek Multiclix Lancets Misc (Lancets) .... Use to check blood sugar three times a day (250.00) 13)  Cyclobenzaprine Hcl 5 Mg Tabs (Cyclobenzaprine hcl) .... One by mouth once daily as needed 14)  Bactrim Ds 800-160 Mg Tabs (Sulfamethoxazole-trimethoprim) .... One by mouth two times a day for 7 days  Patient Instructions: 1)  Drink plenty of fluids up to 3-4 quarts a day. Cranberry juice is especially recommended in addition to large amounts of water. Avoid caffeine & carbonated drinks, they tend to irritate the bladder, Return in 3-5 days if you're not better: sooner if you're feeling worse.  2)  Watch quanitity of cranberry juice with your diabetes. Prescriptions: BACTRIM DS 800-160 MG TABS (SULFAMETHOXAZOLE-TRIMETHOPRIM) one by mouth two times a day for 7 days  #14 x 0   Entered and Authorized by:   Evelena Peat MD   Signed by:   Evelena Peat MD on 11/08/2010   Method used:   Electronically to        [facility] [address] (retail)       [phone]       [place], [place]  [address]       Ph: [phone]       Fax: [phone]   RxID:   [id number]    Orders Added: 1)  UA Dipstick w/o Micro (manual) [81002] 2)  Est. Patient Level III [[address]]    Laboratory Results   Urine Tests    Routine Urinalysis   Glucose: negative   (Normal Range:  Negative) Bilirubin: negative   (Normal Range: Negative) Ketone: negative   (Normal Range: Negative) Spec. Gravity: <1.005   (Normal Range: 1.003-1.035) Blood: negative   (Normal Range: Negative) pH: 7.0   (Normal Range: 5.0-8.0) Protein: negative   (Normal Range: Negative) Urobilinogen: 0.2   (Normal Range: 0-1) Nitrite: negative   (Normal Range: Negative) Leukocyte Esterace: negative   (Normal Range: Negative)

## 2010-12-25 NOTE — Progress Notes (Signed)
Summary: Urine sample, sinus drainage  Phone Note Call from Patient Call back at cell [phone]   Caller: Patient Call For: [name] Summary of Call: Pt called stating she is having burning with urination and thinks she may have a UTI. She would like an order sent to [facility] lab so she can drop a urine off for testing. Also states she has sinus drainage and headache around her left eye and would like to know what she can take OTC as she is diabetic. Please advise. Nicki Guadalajara Fergerson CMA Duncan Dull)  November 07, 2010 8:10 AM   Follow-up for Phone Call        call returned to patient at [phone], she was advised that she will need evaluation. She was informed of Saturday Clinic at [facility],and if she could not make it there during clinic hours, she was advised to been seen in urgent care. Follow-up by: Glendell Docker CMA,  November 07, 2010 5:05 PM

## 2010-12-25 NOTE — Progress Notes (Signed)
Summary: Glucose Meter   Phone Note Call from Patient Call back at [facility] Phone [phone]   Caller: Patient Call For: Joany Khatib  Summary of Call: YOU GAVE HER A BLOOD SUGAR METER.  THE METER SAYS THAT IT NEEDS A CODE BAR.  SHE DOES NOT HAVE A CODE BAR IN THE BOX  Initial call taken by: Roselle Locus,  December 05, 2010 10:59 AM  Follow-up for Phone Call        call was returned to patient at [phone], female individula stated patient was not available. Message was left for patient to return call Follow-up by: Glendell Docker CMA,  December 05, 2010 2:52 PM  Additional Follow-up for Phone Call Additional follow up Details #1::        PATIENT CALLED BACK CALL [phone] OR [phone] YOU CAN LEAVE MESSAGE ON [phone] Roselle Locus  December 08, 2010 9:25 AM    Additional Follow-up for Phone Call Additional follow up Details #2::    call returned to patient at [phone], a detailed voice message was left advising patient to return phone call with details of what is wrong with her meter, and what parts are missing. Follow-up by: Glendell Docker CMA,  December 08, 2010 9:36 AM  Additional Follow-up for Phone Call Additional follow up Details #3:: Details for Additional Follow-up Action Taken: patiet returned call and states she is missing the coding mechanism for the one touch monitor.  she was advised a rx could be sent to pharmacy, for the missing piece. She states that she does not have the money presently to get a rx. Glendell Docker CMA  December 08, 2010 2:45 PM   patient returne phone call, and stated she was given an Accu-Chek Aviva meter, she does not have to code port for the meter, and she would like test strips for the onetouch meter that she has at home Additional Follow-up by: Glendell Docker CMA,  December 08, 2010 5:02 PM  New/Updated Medications: ONETOUCH TEST  STRP (GLUCOSE BLOOD) use to test blood sugar three times a day Prescriptions: ONETOUCH TEST  STRP (GLUCOSE BLOOD) use to  test blood sugar three times a day  #100 x 1   Entered by:   Glendell Docker CMA   Authorized by:   [name]   Signed by:   Glendell Docker CMA on 12/08/2010   Method used:   Electronically to        [facility] [address] (retail)       [phone]       [place], [place]  [address]       Ph: [phone]       Fax: [phone]   RxID:   [id number]

## 2011-01-02 ENCOUNTER — Telehealth: Payer: Self-pay | Admitting: Internal Medicine

## 2011-01-05 ENCOUNTER — Telehealth: Payer: Self-pay | Admitting: Internal Medicine

## 2011-01-08 NOTE — Progress Notes (Signed)
Summary: Diovan Samples  Phone Note Call from Patient Call back at [facility] Phone [phone] Call back at Work Phone [phone]   Caller: Patient Call For: [name] Summary of Call: patient called and left voice message wanting to know if she could pick up samples of Diovan.  Initial call taken by: Glendell Docker CMA,  January 02, 2011 1:20 PM  Follow-up for Phone Call        call returned to patient at  [phone]. Patient has been informed samples of Diovan left at front desk for pick up Follow-up by: Glendell Docker CMA,  January 02, 2011 1:24 PM    New/Updated Medications: DIOVAN 160 MG TABS (VALSARTAN) one half by mouth once daily Prescriptions: DIOVAN 160 MG TABS (VALSARTAN) one half by mouth once daily  #30 x 0   Entered by:   Glendell Docker CMA   Authorized by:   [name]   Signed by:   Glendell Docker CMA on 01/02/2011   Method used:   Samples Given   RxID:   [id number]

## 2011-01-14 NOTE — Progress Notes (Signed)
Summary: samples of symvastatin  Phone Note Call from Patient Call back at [facility] Phone [phone]   Summary of Call: pt called and was wanting to know if she could have some samples of symvastatin? please assist. Initial call taken by: Elba Barman,  January 05, 2011 9:40 AM  Follow-up for Phone Call        call placed to patient at  [phone], no answer-no voice mail. Call placed to patient at [phone], no answer. A detailed voice message was left informing patient office does not carry samples of Simvastatin Follow-up by: Glendell Docker CMA,  January 05, 2011 10:38 AM

## 2011-02-23 ENCOUNTER — Telehealth: Payer: Self-pay | Admitting: Internal Medicine

## 2011-02-23 ENCOUNTER — Other Ambulatory Visit: Payer: Self-pay | Admitting: Internal Medicine

## 2011-02-23 DIAGNOSIS — I10 Essential (primary) hypertension: Secondary | ICD-10-CM

## 2011-02-23 DIAGNOSIS — E119 Type 2 diabetes mellitus without complications: Secondary | ICD-10-CM

## 2011-02-23 MED ORDER — METFORMIN HCL 500 MG PO TABS: 500.0000 mg | ORAL_TABLET | Freq: Two times a day (BID) | ORAL | Status: DC

## 2011-02-23 NOTE — Telephone Encounter (Signed)
Call placed to [facility] pharmacy on [facility] [address] [facility] spoke with Herbert Seta, she states patient is on file, but has never had a rx filled at that location. Rx sent electronically had not been received. Attempted to contact patient to verify pharmacy that she uses, no answer and no voice message

## 2011-02-23 NOTE — Telephone Encounter (Signed)
Refill-metformin 500mg  tab. Take two tablets by mouth twice daily. Qty 360. Last fill 2.20.12

## 2011-02-23 NOTE — Telephone Encounter (Signed)
Call placed to [facility] on [address] , spoke with pharmacist regarding transferring rx that was sent for Metformin. The  pharmacist states he will place the rx on hold and asked that I  contact [facility] to have them fill the rx. [facility] to [facility] [address],pharmacist there states she could not see the rx , and asked that it be resent.  Metformin resent to [facility] on [address]

## 2011-02-23 NOTE — Telephone Encounter (Signed)
rx refill sent to [facility]

## 2011-02-28 LAB — POCT CARDIAC MARKERS
CKMB, poc: 1.4 ng/mL (ref 1.0–8.0)
Troponin i, poc: <0.05 ng/mL (ref 0.00–0.09)

## 2011-02-28 LAB — COMPREHENSIVE METABOLIC PANEL
Albumin: 4 g/dL (ref 3.5–5.2)
Alkaline Phosphatase: 46 U/L (ref 39–117)
CO2: 25 mEq/L (ref 19–32)
Calcium: 9.7 mg/dL (ref 8.4–10.5)
Chloride: 107 mEq/L (ref 96–112)
GFR calc Af Amer: 43 mL/min — ABNORMAL LOW (ref >60)
Total Protein: 7 g/dL (ref 6.0–8.3)

## 2011-02-28 LAB — DIFFERENTIAL
Basophils Relative: 1 % (ref 0–1)
Lymphs Abs: 2.5 10*3/uL (ref 0.7–4.0)
Monocytes Absolute: 0.5 10*3/uL (ref 0.1–1.0)
Monocytes Relative: 5 % (ref 3–12)
Neutro Abs: 6.5 10*3/uL (ref 1.7–7.7)
Neutrophils Relative %: 67 % (ref 43–77)

## 2011-02-28 LAB — CBC
HCT: 41.3 % (ref 36.0–46.0)
MCV: 80.7 fL (ref 78.0–100.0)
RBC: 5.12 MIL/uL — ABNORMAL HIGH (ref 3.87–5.11)
WBC: 9.8 10*3/uL (ref 4.0–10.5)

## 2011-02-28 LAB — GLUCOSE, CAPILLARY

## 2011-03-04 ENCOUNTER — Other Ambulatory Visit: Payer: Self-pay

## 2011-03-04 ENCOUNTER — Other Ambulatory Visit (INDEPENDENT_AMBULATORY_CARE_PROVIDER_SITE_OTHER): Payer: Self-pay

## 2011-03-04 DIAGNOSIS — E119 Type 2 diabetes mellitus without complications: Secondary | ICD-10-CM

## 2011-03-04 DIAGNOSIS — I10 Essential (primary) hypertension: Secondary | ICD-10-CM

## 2011-03-04 LAB — BASIC METABOLIC PANEL
BUN: 19 mg/dL (ref 6–23)
Chloride: 104 mEq/L (ref 96–112)
Creatinine, Ser: 0.7 mg/dL (ref 0.4–1.2)
GFR: 105.43 mL/min (ref >60.00)

## 2011-03-04 LAB — HEMOGLOBIN A1C: Hgb A1c MFr Bld: 6.5 % (ref 4.6–6.5)

## 2011-03-11 ENCOUNTER — Encounter: Payer: Self-pay | Admitting: Internal Medicine

## 2011-03-17 ENCOUNTER — Encounter: Payer: Self-pay | Admitting: Internal Medicine

## 2011-03-17 NOTE — Progress Notes (Signed)
This encounter was created in error - please disregard.  This encounter was created in error - please disregard.

## 2011-03-18 ENCOUNTER — Ambulatory Visit (INDEPENDENT_AMBULATORY_CARE_PROVIDER_SITE_OTHER): Payer: Self-pay | Admitting: Internal Medicine

## 2011-03-18 ENCOUNTER — Encounter: Payer: Self-pay | Admitting: Internal Medicine

## 2011-03-18 DIAGNOSIS — I10 Essential (primary) hypertension: Secondary | ICD-10-CM

## 2011-03-18 DIAGNOSIS — E785 Hyperlipidemia, unspecified: Secondary | ICD-10-CM

## 2011-03-18 DIAGNOSIS — E119 Type 2 diabetes mellitus without complications: Secondary | ICD-10-CM

## 2011-03-18 LAB — HM PAP SMEAR

## 2011-03-18 MED ORDER — METFORMIN HCL 500 MG PO TABS: ORAL_TABLET | ORAL | Status: DC

## 2011-03-18 MED ORDER — SIMVASTATIN 40 MG PO TABS: 40.0000 mg | ORAL_TABLET | Freq: Every day | ORAL | Status: DC

## 2011-03-18 NOTE — Patient Instructions (Addendum)
Please complete the following lab tests before your next follow up appointment: BMET, A1c - 250.00 

## 2011-03-20 ENCOUNTER — Telehealth: Payer: Self-pay | Admitting: Internal Medicine

## 2011-03-20 NOTE — Telephone Encounter (Signed)
Call returned to patient at [phone],  Patient states that she still has headache. She states that her blood sugar has been 186 over the past 2 days.She states the pharmacy gave her 1000 mg of the Metformin she wanted to verify with Dr Artist Pais that it is ok to take the 1000 mg and would like to know what should she do about her blood sugar.

## 2011-03-20 NOTE — Telephone Encounter (Signed)
Call placed to patient at [phone], she was advised per Dr Artist Pais instructions and has verbalized understanding

## 2011-03-20 NOTE — Telephone Encounter (Signed)
Pt states that when she took her sugar this morning it was 185. Pt not feeling well. I offered her an appt but pt declined. Patient states that she also has questions regarding her metformin.

## 2011-03-20 NOTE — Telephone Encounter (Signed)
I suggest ibuprofen 200 mg - 2 tabs x 1 For elevated blood sugar - drink extra fluids (water only) If she has difficulty with higher dose of metformin - she can take 1/2 tab bid

## 2011-03-23 ENCOUNTER — Telehealth: Payer: Self-pay | Admitting: *Deleted

## 2011-03-23 ENCOUNTER — Ambulatory Visit (INDEPENDENT_AMBULATORY_CARE_PROVIDER_SITE_OTHER): Payer: Self-pay | Admitting: Internal Medicine

## 2011-03-23 ENCOUNTER — Encounter: Payer: Self-pay | Admitting: Internal Medicine

## 2011-03-23 VITALS — BP 130/70 | HR 83 | Temp 98.0°F | Resp 18 | Ht 63.5 in | Wt 155.0 lb

## 2011-03-23 DIAGNOSIS — J310 Chronic rhinitis: Secondary | ICD-10-CM

## 2011-03-23 DIAGNOSIS — I10 Essential (primary) hypertension: Secondary | ICD-10-CM

## 2011-03-23 DIAGNOSIS — R51 Headache: Secondary | ICD-10-CM

## 2011-03-23 DIAGNOSIS — E119 Type 2 diabetes mellitus without complications: Secondary | ICD-10-CM

## 2011-03-23 MED ORDER — CYCLOBENZAPRINE HCL 5 MG PO TABS: 5.0000 mg | ORAL_TABLET | Freq: Every day | ORAL | Status: DC | PRN

## 2011-03-23 NOTE — Telephone Encounter (Signed)
Triage Record Num: [id number] Operator: Tarri Glenn Patient Name: Cathy [name] Call Date & Time: 03/21/2011 7:52:32AM Patient Phone: [phone] PCP: Thomos Lemons Patient Gender: Female PCP Fax : [phone] Patient DOB: [birth year] Practice Name: [facility] Reason for Call: Patient calling about was seen in office 03/18/11 and was given a prescription for Metformin and got it filled at [facility] pharmacy on 03/19/11. Patient reports that she feels different with this new prescription of Metformin. Blood sugar first time this morning was 455, on recheck it was 183. Patient rechecked sugar @ 0800 was 211 after eating. Afebrile. Normal blood sugar 120-160's. Patient also complaining of headache. Patient also reports that she had 3 pieces of pizza 03/20/11. All emergent s/s r/o per Diabetes:Control Problems protocol and Headache protocol. Home care advice given. Protocol(s) Used: Diabetes: Control Problems Recommended Outcome per Protocol: See Provider within 24 hours Reason for Outcome: All other situations Care Advice: Follow the usual meal plan if possible. Drink extra non-caloric fluids. If unable to eat at all, drink regular soft drinks and juices so that 50 grams of carbohydrates are taken in every 3 to 4 hours. ~ ~ Test your blood sugar before driving. Do not drive if blood sugar 70 mg/dl or less. Call provider within 24 hours if blood sugar is 250 mg/dl two times in a row, or if most blood sugars are 250 mg/dl for more than 3 days. Call provider immediately if urine ketones are large (3+ to 4+). ~ Low Blood Sugar Treatment: - Always have some form of glucose available for use. This may include glucose tablets or hard candies. - When blood sugar is known to be low, take 4 oz. (0.1 liter) of juice, 3 glucose tablets, or 5-6 hard candies. - If unavailable, take 10 to 15 grams of any carbohydrate: -- 6-8 oz. (.15 to .2 liter) skim or 1% milk -- candy bar, fruit, cheese  or crackers - Retest blood sugar after 15 to 20 minutes. - If blood sugar still less than 80 mg/dl, continue treating and testing until blood sugar reaches 80 mg/dl. - Be careful not to overtreat. ~ High Blood Sugar Treatment: - Follow action plan. - Adjust medications if instructed to do so in action plan or by provider. - Test blood sugar and ketones more often. - Record blood sugar and ketones in meter log or separate logbook, and take to all provider visits. - Drink extra water and other non-sugar fluids to prevent dehydration when the blood sugar is high and urine ketones are present. ~ Diabetes Action Plan: - Follow recommended action plan and diet - Check blood sugar as recommended - Take diabetic pills or insulin as ordered - Follow action plan for illness. ~ 04

## 2011-03-23 NOTE — Progress Notes (Signed)
Subjective:    Patient ID: Burney Gauze, female    DOB: 13-Nov-1946, 65 y.o.   MRN: [id number]  HPI  65 y/o female for follow up re: DM II.  She has been checking her blood sugars intermittently.   She is worried her blood sugars are rising.  She denies polyuria or polydipsia.  Pt also c/o chronic sinus congestion and occ headache.  No fever or chills.  No purulent drainage.  Review of Systems  Past Medical History  Diagnosis Date  . Diabetes mellitus type II   . Hyperlipidemia   . Hypertension   . History of depression   . DJD (degenerative joint disease)   . Low back pain   . History of MRSA infection     recurrent carbuncle    History   Social History  . Marital Status: Single    Spouse Name: N/A    Number of Children: N/A  . Years of Education: N/A   Occupational History  . Not on file.   Social History Main Topics  . Smoking status: Former Games developer  . Smokeless tobacco: Not on file   Comment: quit 10-11 years ago light smoker  . Alcohol Use: Yes  . Drug Use: Not on file  . Sexually Active: Not on file   Other Topics Concern  . Not on file   Social History Narrative   SingleFormer Smoker  -  quit 10 to 11 years ago (light smoker)Alcohol use-yes  2 children Occupation: [employer]     Past Surgical History  Procedure Date  . Breast biopsy   . Appendectomy   . Abdominal hysterectomy   . Tubal ligation   . Inguinal hernia repair     Family History  Problem Relation Age of Onset  . Aneurysm Mother 49    deceased secondary to brain aneurysm  . Liver cancer Father 31    deceased  . Diabetes      grandmother    Allergies  Allergen Reactions  . Penicillins     REACTION: urticaria (hives)    Current Outpatient Prescriptions on File Prior to Visit  Medication Sig Dispense Refill  . aspirin 81 MG tablet Take 81 mg by mouth daily.        . Calcium Carbonate-Vitamin D (CALTRATE 600+D) 600-400 MG-UNIT per tablet Take 1 tablet by  mouth daily.        . folic acid (FOLVITE) 1 MG tablet Take 1 mg by mouth daily.        [name] glucose blood (ACCU-CHEK AVIVA) test strip 1 each by Other route. Use as instructed to check blood sugar three time daily (250.00)       . HYDROcodone-acetaminophen (NORCO) 5-325 MG per tablet Take 2 tablets by mouth every 6 (six) hours as needed.        . Lancets (ACCU-CHEK MULTICLIX) lancets 1 each by Other route. Use as instructed to check blood sugar three times daily (250.00)       . metFORMIN (GLUCOPHAGE) 500 MG tablet 2 tablets by mouth twice day  360 tablet  1  . Multiple Vitamin (MULTIVITAMIN) tablet Take 1 tablet by mouth daily.        [name] olopatadine (PATANOL) 0.1 % ophthalmic solution Place 1 drop into both eyes daily. For allergy symptoms       . Omega-3 Fatty Acids (FISH OIL) 1000 MG CAPS Take 1,000 capsules by mouth daily.        . simvastatin (  ZOCOR) 40 MG tablet Take 1 tablet (40 mg total) by mouth at bedtime.  90 tablet  1  . valsartan (DIOVAN) 160 MG tablet Take 80 mg by mouth. Take 1/2 tablet by mouth once daily         BP 130/70  Pulse 83  Temp(Src) 98 F (36.7 C) (Oral)  Resp 18  Ht 5' 3.5" (1.613 m)  Wt 155 lb (70.308 kg)  BMI 27.03 kg/m2  SpO2 100%       Objective:   Physical Exam     Constitutional: Appears well-developed and well-nourished. No distress.  Head: Normocephalic and atraumatic.  Right Ear: External ear normal.  Left Ear: External ear normal.  Mouth/Throat: Oropharynx is clear and moist.  Eyes: Conjunctivae are normal. Pupils are equal, round, and reactive to light.  Neck: Normal range of motion. Neck supple. No thyromegaly present. No carotid bruit Cardiovascular: Normal rate, regular rhythm and normal heart sounds.  Exam reveals no gallop and no friction rub.   Pulmonary/Chest: Effort normal and breath sounds normal.  No wheezes. No rales.    Assessment & Plan:

## 2011-03-23 NOTE — Patient Instructions (Signed)
Keep your next follow up appointment. Please call our office if your headache symptoms do not improve or gets worse.

## 2011-04-07 NOTE — Assessment & Plan Note (Signed)
[facility]                                 ON-CALL NOTE   NAME:[name], Cathy                       MRN:          [id number]  DATE:05/14/2007                            DOB:          1946/06/30    Patient of Dr. Artist Pais.   Called from [phone] stating she was told at work she had a urinary  tract infection. She is coming in this morning to be evaluated.     Lelon Perla, DO  Electronically Signed    Shawnie Dapper  DD: 05/14/2007  DT: 05/14/2007  Job #: [id number]   cc:   Barbette Hair. Artist Pais, DO

## 2011-04-07 NOTE — Assessment & Plan Note (Signed)
[facility]                                 ON-CALL NOTE   NAME:Cathy [name], Cathy [name]                     MRN:          [id number]  DATE:03/16/2009                            DOB:          10-17-1946    Time is 8:58 p.m.   Phone Number is [phone].  Dr. Artist Pais is her regular doctor.   Kendallyn says she is having symptoms consistent with the UTI, pains when  she urinates, she has to frequently go all the time.  No fever, blood,  nausea, vomiting, or back pain at this time.  Her sugar was a little  high yesterday.  She is a diabetic __________ because she is developing  an infection.  She does not think the symptoms are severe enough to the  emergency room tonight; however, I advised her to drink lots of water  and if her symptoms worsen tonight or she develops nausea, vomiting, or  back pain, to go to the ER, otherwise to go tomorrow to [facility]  Urgent Care for evaluation, assessment, and treatment.     Marne A. Tower, MD  Electronically Signed    MAT/MedQ  DD: 03/17/2009  DT: 03/17/2009  Job #: [id number]

## 2011-04-07 NOTE — Assessment & Plan Note (Signed)
[facility]                                 ON-CALL NOTE   NAME:Cathy [name], Cathy [name]                       MRN:          [id number]  DATE:03/16/2009                            DOB:          12/25/1945    DATE AND TIME:  03/16/2009 at 8:58 p.m.   PHONE NUMBER:  [phone].   Regular doctor is Dr. Thomos Lemons.  I am Dr. Milinda Antis on-call.   CHIEF COMPLAINT:  ?UTI.   The patient said that she thinks she has got a urinary tract infection.  She gets them about twice a year.  She is having pain when she urinates  that is a burning sensation and feels like she has to urinate  frequently.  She denies fever, back pain, abdominal pain, blood in her  urine, nausea, vomiting, or any other symptoms.  Her sugar was running a  little high yesterday, but is normal today.  I advised her that if she  is getting a urinary tract infection that I cannot treat that over the  phone with antibiotics and she would need to be seen.  If she thinks she  can hold out until morning, I advised to drink lots of water and watch  closely for symptoms of abdominal pain, fever, nausea, or back pain, if  those develop to go into the emergency room.  Otherwise, she will drink  lots of water tonight and go to [facility] in the morning to  be evaluated and likely treated.     Marne A. Tower, MD  Electronically Signed    MAT/MedQ  DD: 03/16/2009  DT: 03/16/2009  Job #: [id number]

## 2011-04-07 NOTE — Assessment & Plan Note (Signed)
[facility]                                 ON-CALL NOTE   NAME:Noy, Aleane                       MRN:          [id number]  DATE:11/10/2007                            DOB:          [birth year]    TIME OF CALL:  9 p.m.   PHONE NUMBER:  [phone].   PRIMARY:  Dr. Artist Pais.   SUBJECTIVE:  Ms. 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  She wants to  know what she should do at this time.   ASSESSMENT AND PLAN:  Recommended seeing primary care doctor in the  morning, drinking lots of water.  She has diabetes, so will not use  cranberry juice, except for in moderation.  If she has fever, chills,  nausea or vomiting, she will go to be evaluated by a doctor this  evening.  She can also use over-the-counter Azo for the bladder pain.     Kerby Nora, MD  Electronically Signed    AB/MedQ  DD: 11/10/2007  DT: 11/11/2007  Job #: [id number]

## 2011-04-08 NOTE — Assessment & Plan Note (Signed)
Goal LDL < 100. Take simvastatin as directed

## 2011-04-08 NOTE — Assessment & Plan Note (Signed)
Lab Results  Component Value Date   HGBA1C 6.5 03/04/2011   Stable.  Continue current medication regimen.  Lab Results  Component Value Date   CREATININE 0.7 03/04/2011

## 2011-04-08 NOTE — Progress Notes (Signed)
Subjective:    Patient ID: Cathy [name], female    DOB: 1946/09/19, 65 y.o.   MRN: [id number]  HPI  65 y/o female for follow up re:  DM II.  Good dietary compliance.  Taking metformin most of the time.  No microvascular complication of diabetes  Htn - ran out BP meds   Review of Systems Negative for chest pain Past Medical History  Diagnosis Date  . Diabetes mellitus type II   . Hyperlipidemia   . Hypertension   . History of depression   . DJD (degenerative joint disease)   . Low back pain   . History of MRSA infection     recurrent carbuncle    History   Social History  . Marital Status: Single    Spouse Name: N/A    Number of Children: N/A  . Years of Education: N/A   Occupational History  . Not on file.   Social History Main Topics  . Smoking status: Former Games developer  . Smokeless tobacco: Not on file   Comment: quit 10-11 years ago light smoker  . Alcohol Use: Yes  . Drug Use: Not on file  . Sexually Active: Not on file   Other Topics Concern  . Not on file   Social History Narrative   SingleFormer Smoker  -  quit 10 to 11 years ago (light smoker)Alcohol use-yes  2 children Occupation: [employer]     Past Surgical History  Procedure Date  . Breast biopsy   . Appendectomy   . Abdominal hysterectomy   . Tubal ligation   . Inguinal hernia repair     Family History  Problem Relation Age of Onset  . Aneurysm Mother 70    deceased secondary to brain aneurysm  . Liver cancer Father 36    deceased  . Diabetes      grandmother    Allergies  Allergen Reactions  . Penicillins     REACTION: urticaria (hives)    Current Outpatient Prescriptions on File Prior to Visit  Medication Sig Dispense Refill  . aspirin 81 MG tablet Take 81 mg by mouth daily.        . Calcium Carbonate-Vitamin D (CALTRATE 600+D) 600-400 MG-UNIT per tablet Take 1 tablet by mouth daily.        . folic acid (FOLVITE) 1 MG tablet Take 1 mg by mouth daily.         . Multiple Vitamin (MULTIVITAMIN) tablet Take 1 tablet by mouth daily.        . Omega-3 Fatty Acids (FISH OIL) 1000 MG CAPS Take 1,000 capsules by mouth daily.        . valsartan (DIOVAN) 160 MG tablet Take 80 mg by mouth. Take 1/2 tablet by mouth once daily       . glucose blood (ACCU-CHEK AVIVA) test strip 1 each by Other route. Use as instructed to check blood sugar three time daily (250.00)       . HYDROcodone-acetaminophen (NORCO) 5-325 MG per tablet Take 2 tablets by mouth every 6 (six) hours as needed.        . Lancets (ACCU-CHEK MULTICLIX) lancets 1 each by Other route. Use as instructed to check blood sugar three times daily (250.00)       . olopatadine (PATANOL) 0.1 % ophthalmic solution Place 1 drop into both eyes daily. For allergy symptoms         BP 132/90  Pulse 67  Temp(Src) 98 F (  36.7 C) (Oral)  Resp 18  Ht 5' 3.6" (1.615 m)  Wt 158 lb (71.668 kg)  BMI 27.46 kg/m2  SpO2 100%       Objective:   Physical Exam  Constitutional: Appears well-developed and well-nourished. No distress.  Neck: Normal range of motion. Neck supple. No thyromegaly present.       No carotid bruit  Cardiovascular: Normal rate, regular rhythm and normal heart sounds.  Exam reveals no gallop and no friction rub.   No murmur heard. Pulmonary/Chest: Effort normal and breath sounds normal.  No wheezes. No rales.  Abdominal: Soft. Bowel sounds are normal. No mass. There is no tenderness.  Skin: Skin is warm and dry.  Psychiatric: Normal mood and affect. Behavior is normal.      Assessment & Plan:

## 2011-04-08 NOTE — Assessment & Plan Note (Signed)
BP: 132/90 mmHg   Pt ran out of meds.  Samples provided Lab Results  Component Value Date   CREATININE 0.7 03/04/2011

## 2011-04-10 NOTE — Op Note (Signed)
[place]  Patient:    Cathy [name], Cathy [name]                     MRN: [id number] Proc. Date: 05/10/01 Adm. Date:  [id number] Attending:  Henrene Dodge                           Operative Report  PREOPERATIVE DIAGNOSIS:  Left inguinal hernia.  POSTOPERATIVE DIAGNOSIS:  Left inguinal hernia indirect.  OPERATION PERFORMED:  Left inguinal herniorrhaphy with mesh reinforcement.  SURGEON:  Anselm Pancoast. Zachery Dakins, M.D.  ANESTHESIA:  Local with sedation.  INDICATIONS FOR PROCEDURE:  The patient is a 65 year old slightly overweight female who was seen in the emergency department with lower abdominal pain and referred to my office.  When I first saw her I could not definitely feel an inguinal hernia but she was having pain in the left groin.  She had had a CT and on review of the CT, you could see a definite bulge in the left groin.  On re-examination with her standing and moving in certain positions, you could feel this hernia sort of would push out its very quite low and she has had a previous kind of lower abdominal Pfannenstiel type incision.  The hernia does not appear to be in the old Pfannenstiel incision but in either a direct or indirect component.  I recommended that we repair this with local anesthesia with mesh reinforcement.  She is in agreement.  She has not had any change in her bowel movements or any blood in her stools.  DESCRIPTION OF PROCEDURE:  Preoperatively, she was given a gram of Kefzol and taken to the operative suite.  The left groin area was first clipped, then prepped with Betadine solution and draped in a sterile manner.  I anesthetized the inguinal incision area with a mixture of Marcaine 0.5% and 0.25% Xylocaine with Adrenalin and the ilioinguinal nerve area was anesthetized with a blunted 22 gauge needle, all told about 30 cc of solution used.  Sharp dissection down through the skin and subcutaneous tissue and the  external oblique was identified.  I then identified the external ring, opened up the external oblique fascia and this was a definite bulge in the direct component.  After this had all kind of been separated, it was noted that it was really not a direct hernia but an indirect hernia that had basically predominantly fatty tissue prolapsing down and the ilioinguinal nerve was protected.  I separated this fatty tissue from the surrounding structures and then kind of opened the thin-walled type sac and then reduced the preperitoneal tissue.  I then closed the little defect with the 2-0 Prolene, protecting the ilioinguinal nerve. Next, a piece of Prolene mesh shaped like a sail, slit laterally was placed reinforcing the floor and the inferior ileum was sutured to the inguinal ligament with a running 2-0 Prolene, two tails laterally were sutured together where the ilioinguinal nerve comes through and then the superior flap was sutured down with interrupted sutures of 2-0 Prolene.  The mesh was lying flat and then the external oblique was closed over this with a running 2-0 Vicryl. The Scarpas fascia was closed with interrupted 2-0 Vicryl, 5-0 Dexon used subcuticular and then benzoin and Steri-Strips on the skin.  The patient tolerated the procedure nicely, will be released on short stay in the recovery room.  She works with a Engineer, production and I  think will need to be off work about two weeks and then should be able to return to work after that time. DD:  05/10/01 TD:  05/10/01 Job: [id number]

## 2011-04-10 NOTE — Assessment & Plan Note (Signed)
[facility]                             PRIMARY CARE OFFICE NOTE   NAME:Cathy [name], Cathy [name]                     MRN:          [id number]  DATE:07/05/2006                            DOB:          16-Apr-1946    CHIEF COMPLAINT:  New patient to practice.   HISTORY OF PRESENT ILLNESS:  Patient is a 65 year old African American  female.  She has not had a primary care physician in the past.  She has been  seeing Dr. Elana Alm for her gynecologic care.  She has been evaluated by Dr.  Arlyce Dice of [facility] secondary to complaints of constipation and routine  colonoscopy.  Patient noted to have a normal colonoscopy with some internal  hemorrhoids in July of 2006.  Patient also states that approximately two  years ago she was noted to have elevated blood pressure reading and high  cholesterol, specifically elevated triglyceride levels, she was placed on  Tricor but today she could not stay on the medication due to excessive cost.  She has been on over-the-counter garlic tablets since then.  She does not  take any medications for blood pressure, however, she denies any history of  heart disease.  Denies any history of diabetes.   She has had issues with her mood in the past, generally mild, probably  dysthymia.  She has never been hospitalized for depression and she is  currently asymptomatic in that regard.   CURRENT MEDICATIONS:  1. Multivitamin once a day.  2. B12 vitamins once a day.  3. Garlic tablets once a day.  4. Aspirin 81 mg once a day.  5. Aleve p.r.n.   ALLERGIES:  PENICILLIN causes hives.   PAST MEDICAL HISTORY:  1. Hypertension.  2. Dyslipidemia.  3. History of depression.  4. Status post breast biopsy in 1980s.  5. Status post appendectomy 1980s.  6. Status post hysterectomy.  She still has her ovaries.   SOCIAL HISTORY:  Patient is single, has never been married.  She does have  two children, grown, age 63 and 34.  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   FAMILY HISTORY:  Mother deceased at age 22 secondary to brain aneurysm.  Father deceased at age 36, had history of liver cancer.  Grandmother is  known to be diabetic.  No other family history of early coronary disease or  colon cancer or breast cancer.   HABITS:  She socially drinks.  She has a remote history of tobacco use, quit  9-10 years ago, however, she only smoked one or two cigarettes per day for  one or two years.   PREVENTATIVE CARE HISTORY:  Her last mammogram was in August of 2006.  She  is due for her upcoming mammogram.   REVIEW OF SYSTEMS:  No fever or chills, no HEENT symptoms.  Patient denies  any history of chest pain.  She does occasionally have palpitations.  These  palpitations are not associated with dizziness, __________, skipped  heartbeats, no chest pain or shortness of breath.  Denies  any heartburn  unless she eats spicy foods.  No nausea or vomiting, constipation or  diarrhea.  No dark stools or blood in her stool.  She complains of mild  fatigue.  All other systems negative.   PHYSICAL EXAMINATION:  VITAL SIGNS:  Height is 5 feet 4 inches.  Weight is  162 pounds.  Temperature 97.4, pulse 69, blood pressure 162/100 in the left  arm in the seated position.  GENERAL APPEARANCE:  The patient is a very pleasant 65 year old African  American female in no apparent distress.  HEENT:  Normocephalic and atraumatic. Pupils are equal, round and reactive  to light bilaterally.  Extraocular motility was intact.  Patient was  anicteric.  Patient has somewhat pale conjunctivae bilaterally.  Oropharyngeal exam revealed partial upper dental plates, otherwise  unremarkable.  External auditory canals and tympanic membranes are clear  bilaterally.  Hearing was grossly normal.  NECK:  Supple.  No adenopathy, carotid bruit or thyromegaly.  CHEST:  Normal respiratory effort.  Chest is clear to  auscultation  bilaterally.  No wheezing, rhonchi or rales.  CARDIOVASCULAR:  Regular rate and rhythm, no significant murmurs, rubs, or  gallops appreciated.  ABDOMEN:  Slightly protuberant, nontender.  Positive bowel sounds.  No  organomegaly.  MUSCULOSKELETAL:  Patient's extremities were without any clubbing, cyanosis,  or edema.  SKIN:  Warm and dry.  NEUROLOGIC:  Cranial nerves II-XII grossly intact.  She was nonfocal.   EKG was performed in the office which showed normal sinus rhythm at 66 beats  per minute, no acute ST changes were noted.  Patient had normal axis, normal  R-wave progression.   IMPRESSION/RECOMMENDATIONS:  1. Hypertension, uncontrolled.  2. Dyslipidemia.  3. History of depression.  4. History of palpitations.  5. Health maintenance.   RECOMMENDATIONS:  Patient will be started on hydrochlorothiazide today 12.5  mg q.a.m.  She will be sent for follow-up lab data including fasting CBG as  well as lipid profile, TSH and CBC.  Follow-up time will be in approximately  one week.                                   Barbette Hair. Artist Pais, DO   RDY/MedQ  DD:  07/05/2006  DT:  07/06/2006  Job #:  [id number]

## 2011-04-13 NOTE — Telephone Encounter (Signed)
Pt was seen in office on 03/23/11. Note closed.

## 2011-04-28 DIAGNOSIS — J310 Chronic rhinitis: Secondary | ICD-10-CM | POA: Insufficient documentation

## 2011-04-28 NOTE — Assessment & Plan Note (Signed)
Stable.  Samples of Diovan provided. BP: 130/70 mmHg   Lab Results  Component Value Date   CREATININE 0.7 03/04/2011

## 2011-04-28 NOTE — Assessment & Plan Note (Signed)
Pt reports sinus congestion.   I suspect allergic etiology.  Pt advised to use OTC antihistamines and nasal saline sprays. Patient advised to call office if symptoms persist or worsen.

## 2011-04-28 NOTE — Assessment & Plan Note (Addendum)
Pt has noticed higher blood sugars.  No obvious trigger.  Continue metformin and diabetic diet. Pt advised to call office if worsening blood sugar readings.  Lab Results  Component Value Date   HGBA1C 6.5 03/04/2011

## 2011-04-29 ENCOUNTER — Telehealth: Payer: Self-pay | Admitting: *Deleted

## 2011-04-29 MED ORDER — ONETOUCH LANCETS MISC: Status: DC

## 2011-04-29 MED ORDER — GLUCOSE BLOOD VI STRP: ORAL_STRIP | Status: DC

## 2011-04-29 NOTE — Telephone Encounter (Signed)
Patient called and left voice message stating she has spoken with a friend who uses test strips and was advised if she obtains a rx from the doctor, it would cost less and be covered by her insurance. She would like to know if she could get a Rx for test strips sent to her pharmacy.  Rx for one touch test strips and lancets have been sent to patients pharmacy.

## 2011-05-08 ENCOUNTER — Emergency Department (HOSPITAL_COMMUNITY)
Admission: EM | Admit: 2011-05-08 | Discharge: 2011-05-08 | Disposition: A | Discharge status: Home / Self Care | Payer: No Typology Code available for payment source | Attending: Emergency Medicine | Admitting: Emergency Medicine

## 2011-05-08 DIAGNOSIS — M542 Cervicalgia: Secondary | ICD-10-CM | POA: Insufficient documentation

## 2011-05-08 DIAGNOSIS — E119 Type 2 diabetes mellitus without complications: Secondary | ICD-10-CM | POA: Insufficient documentation

## 2011-05-08 DIAGNOSIS — S139XXA Sprain of joints and ligaments of unspecified parts of neck, initial encounter: Secondary | ICD-10-CM | POA: Insufficient documentation

## 2011-05-08 DIAGNOSIS — Y9289 Other specified places as the place of occurrence of the external cause: Secondary | ICD-10-CM | POA: Insufficient documentation

## 2011-05-20 ENCOUNTER — Ambulatory Visit: Payer: No Typology Code available for payment source | Admitting: Family

## 2011-05-20 ENCOUNTER — Ambulatory Visit (HOSPITAL_BASED_OUTPATIENT_CLINIC_OR_DEPARTMENT_OTHER)
Admission: RE | Admit: 2011-05-20 | Discharge: 2011-05-20 | Disposition: A | Discharge status: Home / Self Care | Payer: No Typology Code available for payment source | Source: Ambulatory Visit | Attending: Family | Admitting: Family

## 2011-05-20 ENCOUNTER — Ambulatory Visit (INDEPENDENT_AMBULATORY_CARE_PROVIDER_SITE_OTHER): Payer: No Typology Code available for payment source | Admitting: Family

## 2011-05-20 ENCOUNTER — Encounter: Payer: Self-pay | Admitting: Family

## 2011-05-20 VITALS — BP 124/80 | HR 77 | Temp 97.7°F | Resp 18 | Wt 154.0 lb

## 2011-05-20 DIAGNOSIS — S161XXA Strain of muscle, fascia and tendon at neck level, initial encounter: Secondary | ICD-10-CM | POA: Insufficient documentation

## 2011-05-20 DIAGNOSIS — M542 Cervicalgia: Secondary | ICD-10-CM

## 2011-05-20 DIAGNOSIS — S139XXA Sprain of joints and ligaments of unspecified parts of neck, initial encounter: Secondary | ICD-10-CM

## 2011-05-20 DIAGNOSIS — M47812 Spondylosis without myelopathy or radiculopathy, cervical region: Secondary | ICD-10-CM | POA: Insufficient documentation
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
## 2011-05-20 NOTE — Patient Instructions (Signed)
Call if your symptoms worsen or if they are not improved in 1 month.  Follow up in the end of July for diabetes follow up.
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
## 2011-05-21 ENCOUNTER — Ambulatory Visit: Payer: No Typology Code available for payment source | Admitting: Internal Medicine

## 2011-06-03 ENCOUNTER — Telehealth: Payer: Self-pay | Admitting: *Deleted

## 2011-06-03 DIAGNOSIS — M25561 Pain in right knee: Secondary | ICD-10-CM
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
## 2011-06-04 NOTE — Telephone Encounter (Signed)
Knee x-rays ordered.

## 2011-06-04 NOTE — Telephone Encounter (Signed)
Notified pt that orders have been sent to radiology on the 1st floor. She states she has to work tomorrow but will have xrays taken on Monday.

## 2011-06-19 ENCOUNTER — Ambulatory Visit (HOSPITAL_BASED_OUTPATIENT_CLINIC_OR_DEPARTMENT_OTHER)
Admission: RE | Admit: 2011-06-19 | Discharge: 2011-06-19 | Disposition: A | Discharge status: Home / Self Care | Payer: No Typology Code available for payment source | Source: Ambulatory Visit | Attending: Family | Admitting: Family

## 2011-06-19 ENCOUNTER — Other Ambulatory Visit: Payer: Self-pay | Admitting: Family

## 2011-06-19 ENCOUNTER — Ambulatory Visit (INDEPENDENT_AMBULATORY_CARE_PROVIDER_SITE_OTHER): Payer: No Typology Code available for payment source | Admitting: Family

## 2011-06-19 ENCOUNTER — Ambulatory Visit (INDEPENDENT_AMBULATORY_CARE_PROVIDER_SITE_OTHER)
Admission: RE | Admit: 2011-06-19 | Discharge: 2011-06-19 | Disposition: A | Discharge status: Home / Self Care | Payer: No Typology Code available for payment source | Source: Ambulatory Visit | Attending: Family | Admitting: Family

## 2011-06-19 ENCOUNTER — Encounter: Payer: Self-pay | Admitting: Family

## 2011-06-19 DIAGNOSIS — E119 Type 2 diabetes mellitus without complications: Secondary | ICD-10-CM

## 2011-06-19 DIAGNOSIS — M25561 Pain in right knee: Secondary | ICD-10-CM

## 2011-06-19 DIAGNOSIS — M25569 Pain in unspecified knee: Secondary | ICD-10-CM

## 2011-06-19 DIAGNOSIS — K5900 Constipation, unspecified: Secondary | ICD-10-CM

## 2011-06-19 DIAGNOSIS — N390 Urinary tract infection, site not specified: Secondary | ICD-10-CM

## 2011-06-19 DIAGNOSIS — M1710 Unilateral primary osteoarthritis, unspecified knee: Secondary | ICD-10-CM | POA: Insufficient documentation

## 2011-06-19 DIAGNOSIS — E785 Hyperlipidemia, unspecified: Secondary | ICD-10-CM

## 2011-06-19 DIAGNOSIS — M25562 Pain in left knee: Secondary | ICD-10-CM

## 2011-06-19 DIAGNOSIS — I10 Essential (primary) hypertension: Secondary | ICD-10-CM

## 2011-06-19 DIAGNOSIS — IMO0002 Reserved for concepts with insufficient information to code with codable children: Secondary | ICD-10-CM | POA: Insufficient documentation

## 2011-06-19 LAB — POCT URINALYSIS DIPSTICK
Bilirubin, UA: NEGATIVE
Ketones, UA: NEGATIVE
Nitrite, UA: NEGATIVE
Spec Grav, UA: 1.01
pH, UA: 6

## 2011-06-19 LAB — HEPATIC FUNCTION PANEL
AST: 21 U/L (ref 0–37)
Albumin: 4.6 g/dL (ref 3.5–5.2)
Alkaline Phosphatase: 54 U/L (ref 39–117)
Indirect Bilirubin: 0.3 mg/dL (ref 0.0–0.9)
Total Bilirubin: 0.4 mg/dL (ref 0.3–1.2)

## 2011-06-19 LAB — HEMOGLOBIN A1C: Mean Plasma Glucose: 137 mg/dL — ABNORMAL HIGH (ref <117)

## 2011-06-19 LAB — BASIC METABOLIC PANEL
Glucose, Bld: 92 mg/dL (ref 70–99)
Potassium: 4.2 mEq/L (ref 3.5–5.3)
Sodium: 139 mEq/L (ref 135–145)

## 2011-06-19 MED ORDER — METFORMIN HCL 500 MG PO TABS: ORAL_TABLET | ORAL | Status: DC

## 2011-06-19 MED ORDER — POLYETHYLENE GLYCOL 3350 17 G PO PACK: 17.0000 g | PACK | Freq: Every day | ORAL | Status: AC

## 2011-06-19 MED ORDER — CIPROFLOXACIN HCL 500 MG PO TABS: 500.0000 mg | ORAL_TABLET | Freq: Two times a day (BID) | ORAL | Status: AC

## 2011-06-19 NOTE — Assessment & Plan Note (Signed)
Check A1C and microalbumin.  Continue metformin.

## 2011-06-19 NOTE — Patient Instructions (Addendum)
Please complete lab work on the first floor.  Follow up in 3 months.  Have a nice summer.

## 2011-06-19 NOTE — Assessment & Plan Note (Signed)
Will treat with cipro.  Dip suggestive of UTI. Send urine for culture.

## 2011-06-19 NOTE — Progress Notes (Signed)
Subjective:    Patient ID: Cathy [name], female    DOB: 12-19-1945, 65 y.o.   MRN: [id number]  HPI Cathy [name] is a 65 yr old female who presents today for follow up.  1) DM2- on metformin only. She is requesting rx for a glucose meter.  She is unisured.  She takes metformin.  Watches her diet.    2)  Dysuria-Symptoms started about 2 weeks ago but worsened 2 days ago.  Denies fever, but does reports some low back pain and lower abdominal pain.  Denies hematuria.    3) Constipation- Notes small hard BM this AM.     Review of Systems See  HPI  Past Medical History  Diagnosis Date  . Diabetes mellitus type II   . Hyperlipidemia   . Hypertension   . History of depression   . DJD (degenerative joint disease)   . Low back pain   . History of MRSA infection     recurrent carbuncle    History   Social History  . Marital Status: Single    Spouse Name: N/A    Number of Children: N/A  . Years of Education: N/A   Occupational History  . Not on file.   Social History Main Topics  . Smoking status: Former Games developer  . Smokeless tobacco: Not on file   Comment: quit 10-11 years ago light smoker  . Alcohol Use: Yes  . Drug Use: Not on file  . Sexually Active: Not on file   Other Topics Concern  . Not on file   Social History Narrative   SingleFormer Smoker  -  quit 10 to 11 years ago (light smoker)Alcohol use-yes  2 children Occupation: [employer]     Past Surgical History  Procedure Date  . Breast biopsy   . Appendectomy   . Abdominal hysterectomy   . Tubal ligation   . Inguinal hernia repair     Family History  Problem Relation Age of Onset  . Aneurysm Mother 26    deceased secondary to brain aneurysm  . Liver cancer Father 46    deceased  . Diabetes      grandmother    Allergies  Allergen Reactions  . Penicillins     REACTION: urticaria (hives)    Current Outpatient Prescriptions on File Prior to Visit  Medication Sig Dispense Refill   . aspirin 81 MG tablet Take 81 mg by mouth daily.        . Calcium Carbonate-Vitamin D (CALTRATE 600+D) 600-400 MG-UNIT per tablet Take 1 tablet by mouth daily.        . cyclobenzaprine (FLEXERIL) 5 MG tablet Take 1 tablet (5 mg total) by mouth daily as needed (as needed for headaches ( use at bedtime if possible )).  30 tablet  0  . folic acid (FOLVITE) 1 MG tablet Take 1 mg by mouth daily.        [name] glucose blood (ONE TOUCH TEST STRIPS) test strip Use as instructed to check blood sugar three time per day (250.00)  100 each  3  . metFORMIN (GLUCOPHAGE) 500 MG tablet 2 tablets by mouth twice day  360 tablet  1  . Multiple Vitamin (MULTIVITAMIN) tablet Take 1 tablet by mouth daily.        . Omega-3 Fatty Acids (FISH OIL) 1000 MG CAPS Take 1,000 capsules by mouth daily.        . ONE TOUCH LANCETS MISC Use to check  blood sugar three time per day (250.00)  100 each  3  . simvastatin (ZOCOR) 40 MG tablet Take 1 tablet (40 mg total) by mouth at bedtime.  90 tablet  1  . valsartan (DIOVAN) 160 MG tablet Take 80 mg by mouth. Take 1/2 tablet by mouth once daily         BP 112/62  Pulse 72  Temp(Src) 97.7 F (36.5 C) (Oral)  Resp 16  Ht 5' 3.5" (1.613 m)  Wt 152 lb (68.947 kg)  BMI 26.50 kg/m2       Objective:   Physical Exam  Constitutional: She appears well-developed and well-nourished.  HENT:  Head: Normocephalic and atraumatic.  Eyes: Conjunctivae are normal. Pupils are equal, round, and reactive to light.  Cardiovascular: Normal rate.   Pulmonary/Chest: Effort normal and breath sounds normal.  Musculoskeletal: She exhibits no edema.  Skin: Skin is warm and dry.  Psychiatric: She has a normal mood and affect. Her behavior is normal. Judgment and thought content normal.  GU- Neg CVAT Abd: soft, non-tender, non-distended. No guarding        Assessment & Plan:

## 2011-06-19 NOTE — Assessment & Plan Note (Signed)
Recommended that she make sure that she is eating plenty of fruits/veggies/fiber, drinking plenty of water. Use Miralax PRN.

## 2011-06-19 NOTE — Telephone Encounter (Signed)
Addended by: Sandford Craze on: 06/19/2011 10:59 AM   Modules accepted: Orders

## 2011-06-19 NOTE — Telephone Encounter (Signed)
Addended by: Sandford Craze on: 06/19/2011 10:53 AM   Modules accepted: Orders

## 2011-06-20 LAB — MICROALBUMIN / CREATININE URINE RATIO: Microalb, Ur: 1.03 mg/dL (ref 0.00–1.89)

## 2011-06-21 ENCOUNTER — Telehealth: Payer: Self-pay | Admitting: Family

## 2011-06-21 ENCOUNTER — Encounter: Payer: Self-pay | Admitting: Family

## 2011-06-21 DIAGNOSIS — M25561 Pain in right knee: Secondary | ICD-10-CM

## 2011-06-21 NOTE — Telephone Encounter (Signed)
Please call patient and let her know that her knee x-ray shows arthritis. There is question of a small bone fragment in the right knee.  I would like for her to see an orthopedic specialist to further evaluate.  Also, urine culture is + for UTI.  She should let us know if her urinary symptoms do not improve with cipro.

## 2011-06-22 LAB — URINE CULTURE

## 2011-06-22 NOTE — Telephone Encounter (Signed)
Attempted to reach pt and left message on cell voicemail to return my call.

## 2011-06-23 NOTE — Telephone Encounter (Signed)
Patient returned phone call. [phone] or [phone]

## 2011-06-23 NOTE — Telephone Encounter (Signed)
Pt notified and voices understanding. 

## 2011-07-08 ENCOUNTER — Telehealth: Payer: Self-pay | Admitting: Internal Medicine

## 2011-07-08 NOTE — Telephone Encounter (Signed)
Call placed to patient at [phone], no answer, no voice mail. Additional information is needed from the patient.
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
## 2011-07-08 NOTE — Telephone Encounter (Signed)
Patient would like [place] to send x-ray results for both knees to [facility] attn: kelly young Fax: [phone]. Claim# [id number]. Patient states that [facility] would like these results by the end of day.

## 2011-08-17 ENCOUNTER — Telehealth: Payer: Self-pay

## 2011-08-17 NOTE — Telephone Encounter (Signed)
FYI:Pt states her sugar was 180 this morning. Pt had strawberry shortcake and candied yams yesterday so she believes that's why it was high. Patient states she is going to avoid sweets for the next few days then will call Darlene to update her with her BS numbers.

## 2011-08-18 ENCOUNTER — Telehealth: Payer: Self-pay | Admitting: *Deleted

## 2011-08-18 NOTE — Telephone Encounter (Signed)
Patient stated that her blood sugars have been running aboce 200. Sunday she had a blood sugar reading of 243, and today her blood sugars have been 200 fasting. She stated that she has headache. She stated she is taking Metformin 500 twice a day. She stated her medication was changed at [facility] to reflect 1000 bid. Patient was informed that she is scheduled for follow up on 09/04/2011 with Melissa. She stated that she will have to change her appointment due to transportation issues.

## 2011-08-19 NOTE — Telephone Encounter (Signed)
I recommend that she start taking Metformin 500mg  (2 tabs) twice daily pleaswe.  Follow up in October.

## 2011-08-19 NOTE — Telephone Encounter (Signed)
I would recommend that she continue to monitor her blood sugar. Call if recurrent sugars >250.  Otherwise we will plan to discuss further at her follow up in October.

## 2011-08-19 NOTE — Telephone Encounter (Signed)
Notified pt and she states she has been taking Metformin 1000mg  twice a day for a few months. Pt states she has been under increased stress recently. Pt states she has also had head congestion x 1 week. Took Aleve and Equate allergy tabs yesterday and today but has not seen much relief.  Please advise.

## 2011-08-20 NOTE — Telephone Encounter (Signed)
Pt.notified

## 2011-09-04 ENCOUNTER — Ambulatory Visit: Payer: No Typology Code available for payment source | Admitting: Family

## 2011-09-10 ENCOUNTER — Other Ambulatory Visit: Payer: Self-pay | Admitting: Internal Medicine

## 2011-09-21 ENCOUNTER — Other Ambulatory Visit: Payer: Self-pay | Admitting: Obstetrics and Gynecology

## 2011-09-21 DIAGNOSIS — Z1231 Encounter for screening mammogram for malignant neoplasm of breast: Secondary | ICD-10-CM

## 2011-09-22 ENCOUNTER — Encounter: Payer: Self-pay | Admitting: Family

## 2011-09-22 ENCOUNTER — Ambulatory Visit (INDEPENDENT_AMBULATORY_CARE_PROVIDER_SITE_OTHER): Payer: No Typology Code available for payment source | Admitting: *Deleted

## 2011-09-22 ENCOUNTER — Ambulatory Visit (HOSPITAL_COMMUNITY)
Admission: RE | Admit: 2011-09-22 | Discharge: 2011-09-22 | Disposition: A | Discharge status: Home / Self Care | Payer: No Typology Code available for payment source | Source: Ambulatory Visit | Attending: Obstetrics and Gynecology | Admitting: Obstetrics and Gynecology

## 2011-09-22 ENCOUNTER — Ambulatory Visit (INDEPENDENT_AMBULATORY_CARE_PROVIDER_SITE_OTHER): Payer: No Typology Code available for payment source | Admitting: Family

## 2011-09-22 VITALS — BP 137/82 | HR 80 | Temp 96.8°F | Ht 64.0 in | Wt 146.4 lb

## 2011-09-22 VITALS — BP 94/60 | HR 75 | Temp 97.5°F | Resp 16 | Ht 63.5 in | Wt 147.0 lb

## 2011-09-22 DIAGNOSIS — Z01419 Encounter for gynecological examination (general) (routine) without abnormal findings: Secondary | ICD-10-CM

## 2011-09-22 DIAGNOSIS — Z1231 Encounter for screening mammogram for malignant neoplasm of breast: Secondary | ICD-10-CM

## 2011-09-22 DIAGNOSIS — I10 Essential (primary) hypertension: Secondary | ICD-10-CM

## 2011-09-22 DIAGNOSIS — Z8659 Personal history of other mental and behavioral disorders: Secondary | ICD-10-CM

## 2011-09-22 DIAGNOSIS — Z23 Encounter for immunization: Secondary | ICD-10-CM

## 2011-09-22 DIAGNOSIS — E119 Type 2 diabetes mellitus without complications: Secondary | ICD-10-CM

## 2011-09-22 MED ORDER — SIMVASTATIN 40 MG PO TABS: 40.0000 mg | ORAL_TABLET | Freq: Every day | ORAL | Status: DC

## 2011-09-22 NOTE — Progress Notes (Signed)
No complaints today.  Pap Smear:    Pap smear completed today. Patient has a history of a partial hysterectomy for benign reasons. Completed a vaginal Pap smear. Patient hasn't had a Pap smear in years. Patient couldn't remember when last Pap smear was and no Pap smear results are in EPIC. Per patient no history of abnormal Pap smears. Informed patient that if this Pap smear comes back normal she will not need any further Pap smears.    Physical exam: Breasts Breasts symmetrical. No skin abnormalities bilateral breasts. No nipple retraction bilateral breasts. No nipple discharge bilateral breasts. No lymphadenopathy. No lumps palpated bilateral breasts.          Pelvic/Bimanual   Ext Genitalia No lesions, no swelling and no discharge observed on external genitalia.         Vagina Vagina red and dry in appearance. No lesions or discharge observed in vagina.          Cervix Cervix is not present related to a history of a partial hysterectomy.          Uterus Uterus is absent due to a history of a partial hysterectomy.         Adnexae Bilateral ovaries present and palpable. No tenderness on palpation.        Rectovaginal No rectal exam completed today since patient had no rectal complaints. No skin abnormalities observed on exam.

## 2011-09-22 NOTE — Patient Instructions (Addendum)
Please come fasting to your follow up appointment in 3 months.

## 2011-09-22 NOTE — Assessment & Plan Note (Signed)
This appears stable 

## 2011-09-22 NOTE — Assessment & Plan Note (Signed)
Clinically stable on metformin.  Check urine microalbumin.  I recommended that she have an eye exam. She wishes to wait until she has medicaid.

## 2011-09-22 NOTE — Progress Notes (Signed)
Subjective:    Patient ID: Cathy [name], female    DOB: 26-Aug-1946, 65 y.o.   MRN: [id number]  HPI  [name] is a 64 yr old female who presents today for follow up.  1) DM2- Has not had an eye exam.   Flu shot is due. She notes occasional urine frequency.  Her meter broke.  Generally her sugar is in the 120-130.  2) Depression- is "alright." Sleeping ok.  Appetite is good.  Feels motivated.  Exercising regularly.  3) HTN-  She tells me that she had her pap smear and mammogram at [facility] hospital and her BP was 130's/80's.  She reports + med compliance.  Denies chest pain, shortness of breath or LE edema.   Review of Systems See HPI  Past Medical History  Diagnosis Date  . Diabetes mellitus type II   . Hyperlipidemia   . Hypertension   . History of depression   . DJD (degenerative joint disease)   . Low back pain   . History of MRSA infection     recurrent carbuncle    History   Social History  . Marital Status: Single    Spouse Name: N/A    Number of Children: N/A  . Years of Education: N/A   Occupational History  . Not on file.   Social History Main Topics  . Smoking status: Former Games developer  . Smokeless tobacco: Never Used   Comment: quit 10-11 years ago light smoker  . Alcohol Use: Yes  . Drug Use: No  . Sexually Active: Not Currently    Birth Control/ Protection: Surgical   Other Topics Concern  . Not on file   Social History Narrative   SingleFormer Smoker  -  quit 10 to 11 years ago (light smoker)Alcohol use-yes  2 children Occupation: [employer]     Past Surgical History  Procedure Date  . Breast biopsy   . Appendectomy   . Abdominal hysterectomy   . Tubal ligation   . Inguinal hernia repair     Family History  Problem Relation Age of Onset  . Aneurysm Mother 79    deceased secondary to brain aneurysm  . Liver cancer Father 21    deceased  . Diabetes      grandmother    Allergies  Allergen Reactions  .  Penicillins     REACTION: urticaria (hives)    Current Outpatient Prescriptions on File Prior to Visit  Medication Sig Dispense Refill  . aspirin 81 MG tablet Take 81 mg by mouth daily.        . Calcium Carbonate-Vitamin D (CALTRATE 600+D) 600-400 MG-UNIT per tablet Take 1 tablet by mouth daily.        . cyclobenzaprine (FLEXERIL) 5 MG tablet Take 1 tablet (5 mg total) by mouth daily as needed (as needed for headaches ( use at bedtime if possible )).  30 tablet  0  . folic acid (FOLVITE) 1 MG tablet Take 1 mg by mouth daily.        [name] glucose blood (ONE TOUCH TEST STRIPS) test strip Use as instructed to check blood sugar three time per day (250.00)  100 each  3  . metFORMIN (GLUCOPHAGE) 1000 MG tablet Take 1 tablet (1,000 mg total) by mouth 2 (two) times daily with a meal. Office visit is due for refills  60 tablet  1  . Multiple Vitamin (MULTIVITAMIN) tablet Take 1 tablet by mouth daily.        [name]  Omega-3 Fatty Acids (FISH OIL) 1000 MG CAPS Take 1,000 capsules by mouth daily.        . ONE TOUCH LANCETS MISC Use to check blood sugar three time per day (250.00)  100 each  3  . valsartan (DIOVAN) 160 MG tablet Take 80 mg by mouth. Take 1/2 tablet by mouth once daily         BP 94/60  Pulse 75  Temp(Src) 97.5 F (36.4 C) (Oral)  Resp 16  Ht 5' 3.5" (1.613 m)  Wt 147 lb (66.679 kg)  BMI 25.63 kg/m2  SpO2 99%       Objective:   Physical Exam  Constitutional: She appears well-developed and well-nourished.  Cardiovascular: Normal rate and regular rhythm.   No murmur heard. Pulmonary/Chest: Effort normal and breath sounds normal. No respiratory distress. She has no wheezes. She has no rales. She exhibits no tenderness.  Musculoskeletal: She exhibits no edema.  Psychiatric: She has a normal mood and affect. Her behavior is normal. Judgment and thought content normal.          Assessment & Plan:

## 2011-09-22 NOTE — Patient Instructions (Signed)
Taught patient how to perform BSE and gave patient education materials. Patient has had a hysterectomy for benign reasons. Told patient if Pap smear comes back normal that she will not need any further Pap smears. Told patient I will follow up with her within the next two weeks with results. Patient verbalized understanding.

## 2011-09-22 NOTE — Assessment & Plan Note (Signed)
BP Readings from Last 3 Encounters:  09/22/11 94/60  09/22/11 137/82  06/19/11 112/62  BP reading earlier today was much higher. She is asymptomatic. Continue current dose of diovan.

## 2011-09-23 ENCOUNTER — Encounter: Payer: Self-pay | Admitting: Family

## 2011-09-23 LAB — MICROALBUMIN / CREATININE URINE RATIO: Microalb, Ur: 0.5 mg/dL (ref 0.00–1.89)

## 2011-10-01 ENCOUNTER — Encounter: Payer: Self-pay | Admitting: Obstetrics and Gynecology

## 2011-10-30 ENCOUNTER — Other Ambulatory Visit: Payer: Self-pay | Admitting: *Deleted

## 2011-10-30 MED ORDER — METFORMIN HCL 1000 MG PO TABS: 1000.0000 mg | ORAL_TABLET | Freq: Two times a day (BID) | ORAL | Status: DC

## 2011-10-30 NOTE — Telephone Encounter (Signed)
Received call from pt that refill is needed on Metformin. Pt has follow up in January. Refill sent to pharmacy #[id number] x 1 refill.

## 2011-11-06 ENCOUNTER — Telehealth: Payer: Self-pay | Admitting: *Deleted

## 2011-11-06 NOTE — Telephone Encounter (Signed)
Pt.notified

## 2011-11-06 NOTE — Telephone Encounter (Signed)
Reddish discharge implies possible irritation/bleeding. Safest would be nasal saline spray prn. Can attempt antihistamine prn for drainage. May need to be examined especially ear if no improvement over the next several days.

## 2011-11-06 NOTE — Telephone Encounter (Signed)
Pt reports burning in nostril with slightly reddish nasal discharge, headache, left ear pain since yesterday. Pt wants to know what she can try OTC? Please advise.

## 2011-11-11 ENCOUNTER — Other Ambulatory Visit: Payer: Self-pay | Admitting: *Deleted

## 2011-11-11 MED ORDER — METFORMIN HCL 1000 MG PO TABS: 1000.0000 mg | ORAL_TABLET | Freq: Two times a day (BID) | ORAL | Status: DC

## 2011-11-11 NOTE — Telephone Encounter (Signed)
Patient called stating she has requested a refill on Metformin last week. After reviewing patients chart, she was informed Rx refill was sent to [facility] on 10/30/2011. She stated the pharmacy informed her the Rx was not received.Patient was informed Rx would be resent to pharmacy.

## 2011-11-26 ENCOUNTER — Other Ambulatory Visit: Payer: Self-pay | Admitting: *Deleted

## 2011-11-26 MED ORDER — VALSARTAN 160 MG PO TABS: ORAL_TABLET | ORAL | Status: DC

## 2011-11-26 NOTE — Telephone Encounter (Signed)
Received message from pt requesting samples of her Diovan. Spoke to pt to clarify what dose she is currently taking but pt is unsure of the mg. Only knows that she takes 1/2 tablet daily. Med currently entered as 160mg . Take 80mg  daily. Take 1/2 tablet daily. Do you know if pt is taking 1/2 tablet of 160mg  or 1/2 tablet of 80mg . Pt states she is not at home and it will be some time before she can check her previous bottle.

## 2011-11-26 NOTE — Telephone Encounter (Signed)
Pt returned my call call stating her current bottle is 160mg  and she takes 1/2 tablet daily. Advised pt that 80mg  tablets will be left at the front desk for her to pick up and she will need to take 1 tablet daily. Pt voices understanding.

## 2011-12-09 ENCOUNTER — Telehealth: Payer: Self-pay | Admitting: Family

## 2011-12-09 NOTE — Telephone Encounter (Signed)
Left detailed message that per last office note she should be fasting the day of her appt and labs can be drawn then. Advised pt to call if any questions.

## 2011-12-09 NOTE — Telephone Encounter (Signed)
Patient has appt on 1/25. She needs to know if she needs labs prior to this appt. If so, she states that she will go to the [place] lab.

## 2011-12-18 ENCOUNTER — Encounter: Payer: Self-pay | Admitting: Family

## 2011-12-18 ENCOUNTER — Ambulatory Visit (INDEPENDENT_AMBULATORY_CARE_PROVIDER_SITE_OTHER): Payer: No Typology Code available for payment source | Admitting: Family

## 2011-12-18 VITALS — BP 118/78 | HR 84 | Temp 97.8°F | Resp 18 | Ht 63.5 in | Wt 151.0 lb

## 2011-12-18 DIAGNOSIS — R319 Hematuria, unspecified: Secondary | ICD-10-CM

## 2011-12-18 DIAGNOSIS — E119 Type 2 diabetes mellitus without complications: Secondary | ICD-10-CM

## 2011-12-18 DIAGNOSIS — Z23 Encounter for immunization: Secondary | ICD-10-CM

## 2011-12-18 DIAGNOSIS — I10 Essential (primary) hypertension: Secondary | ICD-10-CM

## 2011-12-18 DIAGNOSIS — R3129 Other microscopic hematuria: Secondary | ICD-10-CM

## 2011-12-18 DIAGNOSIS — R350 Frequency of micturition: Secondary | ICD-10-CM

## 2011-12-18 DIAGNOSIS — N390 Urinary tract infection, site not specified: Secondary | ICD-10-CM

## 2011-12-18 DIAGNOSIS — E785 Hyperlipidemia, unspecified: Secondary | ICD-10-CM

## 2011-12-18 DIAGNOSIS — J329 Chronic sinusitis, unspecified: Secondary | ICD-10-CM

## 2011-12-18 LAB — POCT URINALYSIS DIPSTICK
Ketones, UA: NEGATIVE
Protein, UA: NEGATIVE
Spec Grav, UA: 1.015

## 2011-12-18 LAB — HEPATIC FUNCTION PANEL
ALT: 15 U/L (ref 0–35)
Alkaline Phosphatase: 53 U/L (ref 39–117)
Bilirubin, Direct: 0.1 mg/dL (ref 0.0–0.3)
Indirect Bilirubin: 0.3 mg/dL (ref 0.0–0.9)
Total Protein: 6.8 g/dL (ref 6.0–8.3)

## 2011-12-18 LAB — LIPID PANEL
Cholesterol: 186 mg/dL (ref 0–200)
HDL: 68 mg/dL (ref >39)
Total CHOL/HDL Ratio: 2.7 Ratio
VLDL: 11 mg/dL (ref 0–40)

## 2011-12-18 LAB — BASIC METABOLIC PANEL WITH GFR
BUN: 15 mg/dL (ref 6–23)
CO2: 29 mEq/L (ref 19–32)
Chloride: 103 mEq/L (ref 96–112)
GFR, Est African American: 86 mL/min
Glucose, Bld: 92 mg/dL (ref 70–99)
Potassium: 4.6 mEq/L (ref 3.5–5.3)
Sodium: 140 mEq/L (ref 135–145)

## 2011-12-18 MED ORDER — LEVOFLOXACIN 500 MG PO TABS: 500.0000 mg | ORAL_TABLET | Freq: Every day | ORAL | Status: DC

## 2011-12-18 MED ORDER — METFORMIN HCL 1000 MG PO TABS: 1000.0000 mg | ORAL_TABLET | Freq: Two times a day (BID) | ORAL | Status: DC

## 2011-12-18 MED ORDER — SIMVASTATIN 40 MG PO TABS: 40.0000 mg | ORAL_TABLET | Freq: Every day | ORAL | Status: DC

## 2011-12-18 MED ORDER — VALSARTAN 160 MG PO TABS: ORAL_TABLET | ORAL | Status: DC

## 2011-12-18 NOTE — Progress Notes (Signed)
Subjective:    Patient ID: Cathy [name], female    DOB: 07/05/1946, 66 y.o.   MRN: [id number]  HPI  Cathy [name] is a 66 yr old female who presents today for follow up.   1) She reports complaint of L ear pain- since last week.  Has associated sinus pain/pressure with yellow sinus drainage x 1 week.  Denies fever.  2) Urinary frequency- started 1 week ago.  Mild pressure.   3) DM2- She reports that her sugar was 124 the other day.  She continues metformin.  She will reschedule eye exam.    4) HTN- tolerating valsartan.  Denies CP, SOB or edema.    5) Hyperlipidemia- tolerating simvastatin without myalgias.    Review of Systems See HPI  Past Medical History  Diagnosis Date  . Diabetes mellitus type II   . Hyperlipidemia   . Hypertension   . History of depression   . DJD (degenerative joint disease)   . Low back pain   . History of MRSA infection     recurrent carbuncle    History   Social History  . Marital Status: Single    Spouse Name: N/A    Number of Children: N/A  . Years of Education: N/A   Occupational History  . Not on file.   Social History Main Topics  . Smoking status: Former Games developer  . Smokeless tobacco: Never Used   Comment: quit 10-11 years ago light smoker  . Alcohol Use: Yes  . Drug Use: No  . Sexually Active: Not Currently    Birth Control/ Protection: Surgical   Other Topics Concern  . Not on file   Social History Narrative   SingleFormer Smoker  -  quit 10 to 11 years ago (light smoker)Alcohol use-yes  2 children Occupation: [employer]     Past Surgical History  Procedure Date  . Breast biopsy   . Appendectomy   . Abdominal hysterectomy   . Tubal ligation   . Inguinal hernia repair     Family History  Problem Relation Age of Onset  . Aneurysm Mother 84    deceased secondary to brain aneurysm  . Liver cancer Father 77    deceased  . Diabetes      grandmother    Allergies  Allergen Reactions  .  Penicillins     REACTION: urticaria (hives)    Current Outpatient Prescriptions on File Prior to Visit  Medication Sig Dispense Refill  . aspirin 81 MG tablet Take 81 mg by mouth daily.        . Calcium Carbonate-Vitamin D (CALTRATE 600+D) 600-400 MG-UNIT per tablet Take 1 tablet by mouth daily.        . cyclobenzaprine (FLEXERIL) 5 MG tablet Take 1 tablet (5 mg total) by mouth daily as needed (as needed for headaches ( use at bedtime if possible )).  30 tablet  0  . folic acid (FOLVITE) 1 MG tablet Take 1 mg by mouth daily.        [name] glucose blood (ONE TOUCH TEST STRIPS) test strip Use as instructed to check blood sugar three time per day (250.00)  100 each  3  . Multiple Vitamin (MULTIVITAMIN) tablet Take 1 tablet by mouth daily.        . Omega-3 Fatty Acids (FISH OIL) 1000 MG CAPS Take 1,000 capsules by mouth daily.        . ONE TOUCH LANCETS MISC Use to check blood  sugar three time per day (250.00)  100 each  3    BP 118/78  Pulse 84  Temp(Src) 97.8 F (36.6 C) (Oral)  Resp 18  Ht 5' 3.5" (1.613 m)  Wt 151 lb 0.6 oz (68.511 kg)  BMI 26.34 kg/m2  SpO2 99%       Objective:   Physical Exam  Constitutional: She appears well-developed and well-nourished.  HENT:  Right Ear: Tympanic membrane and ear canal normal.  Left Ear: Tympanic membrane and ear canal normal.  Mouth/Throat: No oropharyngeal exudate or posterior oropharyngeal edema.       + frontal sinus tenderness to palpation  Cardiovascular: Normal rate and regular rhythm.   No murmur heard. Pulmonary/Chest: Effort normal and breath sounds normal. No respiratory distress. She has no wheezes. She has no rales. She exhibits no tenderness.  Musculoskeletal: She exhibits no edema.  Psychiatric: She has a normal mood and affect. Her behavior is normal. Judgment and thought content normal.          Assessment & Plan:

## 2011-12-18 NOTE — Assessment & Plan Note (Signed)
Symptomatic, trace blood on dip.  Rx with levaquin.

## 2011-12-18 NOTE — Patient Instructions (Signed)
Please complete lab work prior to leaving. Follow up in 3 months, sooner if problems or concerns.  

## 2011-12-18 NOTE — Assessment & Plan Note (Signed)
Tolerating statin without myalgias.  Obtain FLP and LFTs.

## 2011-12-18 NOTE — Assessment & Plan Note (Signed)
Will plan to treat with levaquin. Pt is Pen allergic.

## 2011-12-18 NOTE — Assessment & Plan Note (Signed)
Clinically stable on diovan. Continue same.

## 2011-12-18 NOTE — Assessment & Plan Note (Signed)
Clinically stable.  Obtain A1C and bmet.  Pt to reschedule eye exam.  Continue glucophage.

## 2011-12-19 LAB — URINE CULTURE: Colony Count: 4000

## 2011-12-19 LAB — HEMOGLOBIN A1C: Mean Plasma Glucose: 131 mg/dL — ABNORMAL HIGH (ref <117)

## 2011-12-21 ENCOUNTER — Encounter: Payer: Self-pay | Admitting: Family

## 2011-12-21 ENCOUNTER — Telehealth: Payer: Self-pay | Admitting: *Deleted

## 2011-12-21 MED ORDER — SULFAMETHOXAZOLE-TMP DS 800-160 MG PO TABS: 1.0000 | ORAL_TABLET | Freq: Two times a day (BID) | ORAL | Status: AC

## 2011-12-21 NOTE — Telephone Encounter (Signed)
She can try bactrim DS- I have sent to [facility].

## 2011-12-21 NOTE — Telephone Encounter (Signed)
Received message from pt stating levaquin is not on their free antibiotic list. Pt requests that we call [facility] to see if there is a "free" alternative. Per Micah Noel, free abxs available are:  Amoxicillin, ampicillin, cephalexin, cipro, penicillin and bactrim. Please advise if one of these would be an appropriate alternative.

## 2011-12-21 NOTE — Telephone Encounter (Signed)
Attempted to reach pt, left detailed message on cell voicemail re: abx alternative and to call if any questions.

## 2012-02-11 ENCOUNTER — Other Ambulatory Visit: Payer: Self-pay | Admitting: *Deleted

## 2012-02-11 NOTE — Telephone Encounter (Signed)
Cathy [name], pt takes 80mg  Diovan daily and is requesting samples. We have been out. Can you see if there are any samples there that pt could pick up?

## 2012-02-12 MED ORDER — LISINOPRIL 10 MG PO TABS: 10.0000 mg | ORAL_TABLET | Freq: Every day | ORAL | Status: DC

## 2012-02-12 NOTE — Telephone Encounter (Signed)
Cathy [name], sorry we do not have any Diovan.

## 2012-02-12 NOTE — Telephone Encounter (Signed)
Attempted to reach pt and left detailed message on cell # and to call if any questions. 

## 2012-02-12 NOTE — Telephone Encounter (Signed)
She can start lisinopril 10mg  in place of diovan.  Rx sent to [facility] as this is on $4 plan.

## 2012-02-12 NOTE — Telephone Encounter (Signed)
Cathy [name], we are out of Diovan samples. I have contacted the drug rep and was told they do not sample them any longer but could tell us how to order samples through their website. I have left her a message to call me back with this info but the pt states she is completely out. Is there something we cheaper we could prescribe?  Please advise.

## 2012-02-16 ENCOUNTER — Ambulatory Visit (INDEPENDENT_AMBULATORY_CARE_PROVIDER_SITE_OTHER): Payer: No Typology Code available for payment source | Admitting: Family

## 2012-02-16 ENCOUNTER — Encounter: Payer: Self-pay | Admitting: Family

## 2012-02-16 ENCOUNTER — Telehealth: Payer: Self-pay | Admitting: Family

## 2012-02-16 DIAGNOSIS — R079 Chest pain, unspecified: Secondary | ICD-10-CM

## 2012-02-16 DIAGNOSIS — R0789 Other chest pain: Secondary | ICD-10-CM

## 2012-02-16 DIAGNOSIS — J40 Bronchitis, not specified as acute or chronic: Secondary | ICD-10-CM

## 2012-02-16 MED ORDER — BENZONATATE 100 MG PO CAPS: 100.0000 mg | ORAL_CAPSULE | Freq: Three times a day (TID) | ORAL | Status: AC | PRN

## 2012-02-16 MED ORDER — AZITHROMYCIN 250 MG PO TABS: ORAL_TABLET | ORAL | Status: DC

## 2012-02-16 NOTE — Progress Notes (Signed)
Subjective:    Patient ID: Cathy [name], female    DOB: 1945-12-22, 66 y.o.   MRN: [id number]  HPI  Ms.  Stefanko is a 66 yr old female who presents today with chief complaint of cough started 2 weeks ago and is productive of white, occasionally yellow sputum.  She reports associated left chest wall tenderness with cough.  She works at [employer] at [employer] and often has to lift heavy items.  Chest pain comes and goes.  Pain is relieved by laying down still. Worsened by certain movements or coughing.  She denies any associated jaw pain, nausea or fever.     Review of Systems See HPI  Past Medical History  Diagnosis Date  . Diabetes mellitus type II   . Hyperlipidemia   . Hypertension   . History of depression   . DJD (degenerative joint disease)   . Low back pain   . History of MRSA infection     recurrent carbuncle    History   Social History  . Marital Status: Single    Spouse Name: N/A    Number of Children: N/A  . Years of Education: N/A   Occupational History  . Not on file.   Social History Main Topics  . Smoking status: Former Games developer  . Smokeless tobacco: Never Used   Comment: quit 10-11 years ago light smoker  . Alcohol Use: Yes  . Drug Use: No  . Sexually Active: Not Currently    Birth Control/ Protection: Surgical   Other Topics Concern  . Not on file   Social History Narrative   SingleFormer Smoker  -  quit 10 to 11 years ago (light smoker)Alcohol use-yes  2 children Occupation: [employer]     Past Surgical History  Procedure Date  . Breast biopsy   . Appendectomy   . Abdominal hysterectomy   . Tubal ligation   . Inguinal hernia repair     Family History  Problem Relation Age of Onset  . Aneurysm Mother 98    deceased secondary to brain aneurysm  . Liver cancer Father 22    deceased  . Diabetes      grandmother    Allergies  Allergen Reactions  . Penicillins     REACTION: urticaria (hives)    Current Outpatient  Prescriptions on File Prior to Visit  Medication Sig Dispense Refill  . aspirin 81 MG tablet Take 81 mg by mouth daily.        . Calcium Carbonate-Vitamin D (CALTRATE 600+D) 600-400 MG-UNIT per tablet Take 1 tablet by mouth daily.        . folic acid (FOLVITE) 1 MG tablet Take 1 mg by mouth daily.        [name] glucose blood (ONE TOUCH TEST STRIPS) test strip Use as instructed to check blood sugar three time per day (250.00)  100 each  3  . lisinopril (PRINIVIL,ZESTRIL) 10 MG tablet Take 1 tablet (10 mg total) by mouth daily.  30 tablet  0  . metFORMIN (GLUCOPHAGE) 1000 MG tablet Take 1 tablet (1,000 mg total) by mouth 2 (two) times daily with a meal.  60 tablet  5  . Multiple Vitamin (MULTIVITAMIN) tablet Take 1 tablet by mouth daily.        . Omega-3 Fatty Acids (FISH OIL) 1000 MG CAPS Take 1,000 capsules by mouth daily.        . ONE TOUCH LANCETS MISC Use to check blood  sugar three time per day (250.00)  100 each  3  . simvastatin (ZOCOR) 40 MG tablet Take 1 tablet (40 mg total) by mouth at bedtime.  30 tablet  5  . cyclobenzaprine (FLEXERIL) 5 MG tablet Take 1 tablet (5 mg total) by mouth daily as needed (as needed for headaches ( use at bedtime if possible )).  30 tablet  0    BP 126/68  Pulse 84  Temp(Src) 98 F (36.7 C) (Oral)  Resp 16  SpO2 98%       Objective:   Physical Exam  Constitutional: She appears well-developed and well-nourished. No distress.  HENT:  Head: Normocephalic and atraumatic.  Neck: Normal range of motion. Neck supple.  Cardiovascular: Normal rate and regular rhythm.   No murmur heard. Pulmonary/Chest: Effort normal and breath sounds normal. No respiratory distress. She has no wheezes. She has no rales. She exhibits tenderness.  Musculoskeletal: She exhibits no edema.  Psychiatric: She has a normal mood and affect. Her behavior is normal. Judgment and thought content normal.          Assessment & Plan:

## 2012-02-16 NOTE — Telephone Encounter (Signed)
L sided chest pain. Notes that it has been intermittent x 2 weeks.  She reports that her chest pain is located on the left side of chest.  She reports cough. Productive of phlegm. Reports that she tried alka selzer- had pain this AM and last night.  I offered pt apt at 4:15- she lives in [place] and can't make it here in time.  Instead, I recommended that she go to the [facility] ED or [facility] ED for further evaluation given her multiple risk factors for CAD.  Pt verbalizes understanding.

## 2012-02-16 NOTE — Patient Instructions (Signed)
Please call if your cough worsens or if it does not improve.  Go to the ER if worsening chest pain.

## 2012-02-17 ENCOUNTER — Telehealth: Payer: Self-pay | Admitting: Family

## 2012-02-17 DIAGNOSIS — R0789 Other chest pain: Secondary | ICD-10-CM | POA: Insufficient documentation

## 2012-02-17 DIAGNOSIS — J40 Bronchitis, not specified as acute or chronic: Secondary | ICD-10-CM | POA: Insufficient documentation

## 2012-02-17 MED ORDER — SULFAMETHOXAZOLE-TRIMETHOPRIM 800-160 MG PO TABS: 1.0000 | ORAL_TABLET | Freq: Two times a day (BID) | ORAL | Status: AC

## 2012-02-17 NOTE — Telephone Encounter (Signed)
Cell phone# [phone]

## 2012-02-17 NOTE — Assessment & Plan Note (Signed)
CP is very atypical and reproducible. EKG is performed today in the office and I have reviewed EKG with Dr. Rodena Medin- see EKG result note. Should improve as her cough improves.

## 2012-02-17 NOTE — Telephone Encounter (Signed)
I have reviewed the [facility] list and have called rx in for Bactrim which is on her list instead.  Please call pharmacy and cancel zithromax an notify pt. Thanks.

## 2012-02-17 NOTE — Telephone Encounter (Signed)
Left detailed message on pt's cell to get list of "free antibiotics" and call us so we can determine if there is an appropriate alternative.

## 2012-02-17 NOTE — Telephone Encounter (Signed)
Left detailed message on pt's cell#, pharmacy # busy. Notified pharmacist, Trey Paula at [facility].

## 2012-02-17 NOTE — Telephone Encounter (Signed)
Patient states that [facility] gave her a prescription for an antibiotic yesterday, the antibiotic is not on [employer] "free list". She would like Melissa to call her in another type of antibiotic.

## 2012-02-17 NOTE — Assessment & Plan Note (Signed)
Will plan to treat with Bactrim DS (see phone note).  She is instructed to call if symptoms worsen or if no improvement.

## 2012-02-29 ENCOUNTER — Telehealth: Payer: Self-pay | Admitting: *Deleted

## 2012-02-29 NOTE — Telephone Encounter (Signed)
Pt left message to return her call. Attempted to reach pt at [phone] and [phone] and left detailed message on cell# to call with additional readings and medication doses, has she missed any doses, etc. . [name]

## 2012-02-29 NOTE — Telephone Encounter (Signed)
Spoke with pt, she reports that FBS Saturday was 237 then 303 @ 5 minutes later.  States FBS today 157, 173 around 4pm. Pt states she has been under stress lately and her glucometer strips expired on 09/2011. Advised pt to refill One Touch glucometer strips as she thinks she may have refills left at her pharmacy and to call me tomorrow if refills have expired. Advised pt to call us back in 2-3 days with new BS readings.

## 2012-02-29 NOTE — Telephone Encounter (Signed)
Pt left voicemail that her blood sugars have been acting up and to please call and leave a message. Left detailed message on pt's cell# to call us with additional details; readings, medication and doses.

## 2012-03-01 ENCOUNTER — Encounter: Payer: Self-pay | Admitting: Family

## 2012-03-01 ENCOUNTER — Telehealth: Payer: Self-pay | Admitting: *Deleted

## 2012-03-01 ENCOUNTER — Ambulatory Visit (INDEPENDENT_AMBULATORY_CARE_PROVIDER_SITE_OTHER): Payer: No Typology Code available for payment source | Admitting: Family

## 2012-03-01 VITALS — BP 118/70 | HR 88 | Temp 98.2°F | Resp 16 | Wt 145.0 lb

## 2012-03-01 DIAGNOSIS — I10 Essential (primary) hypertension: Secondary | ICD-10-CM

## 2012-03-01 NOTE — Patient Instructions (Signed)
Please do not take lisinopril tomorrow.  You may restart on Thursday, one half tablet daily. Follow up in 1 month.

## 2012-03-01 NOTE — Progress Notes (Signed)
Subjective:    Patient ID: Cathy [name], female    DOB: August 20, 1946, 66 y.o.   MRN: [id number]  HPI  Ms.  Mutz is a 66 yr old female who presents today due to low blood pressure reading.  She reports that she has been feeling very weak and went to the drug store and was told that her BP was 88/65.  She was recently switched to  lisinopril 10 mg once daily in place of diovan 80mg  once daily due to cost.  She reports that she has had polyuria and weakness.  CBG here in the office today is 110.  She is in the process of picking up new strips for her glucose meter.    Review of Systems    see HPI  Past Medical History  Diagnosis Date  . Diabetes mellitus type II   . Hyperlipidemia   . Hypertension   . History of depression   . DJD (degenerative joint disease)   . Low back pain   . History of MRSA infection     recurrent carbuncle    History   Social History  . Marital Status: Single    Spouse Name: N/A    Number of Children: N/A  . Years of Education: N/A   Occupational History  . Not on file.   Social History Main Topics  . Smoking status: Former Games developer  . Smokeless tobacco: Never Used   Comment: quit 10-11 years ago light smoker  . Alcohol Use: Yes  . Drug Use: No  . Sexually Active: Not Currently    Birth Control/ Protection: Surgical   Other Topics Concern  . Not on file   Social History Narrative   SingleFormer Smoker  -  quit 10 to 11 years ago (light smoker)Alcohol use-yes  2 children Occupation: [employer]     Past Surgical History  Procedure Date  . Breast biopsy   . Appendectomy   . Abdominal hysterectomy   . Tubal ligation   . Inguinal hernia repair     Family History  Problem Relation Age of Onset  . Aneurysm Mother 26    deceased secondary to brain aneurysm  . Liver cancer Father 89    deceased  . Diabetes      grandmother    Allergies  Allergen Reactions  . Penicillins     REACTION: urticaria (hives)    Current  Outpatient Prescriptions on File Prior to Visit  Medication Sig Dispense Refill  . aspirin 81 MG tablet Take 81 mg by mouth daily.        . Calcium Carbonate-Vitamin D (CALTRATE 600+D) 600-400 MG-UNIT per tablet Take 1 tablet by mouth daily.        [name] CINNAMON PO Take 2 capsules by mouth 2 (two) times daily.      . cyclobenzaprine (FLEXERIL) 5 MG tablet Take 1 tablet (5 mg total) by mouth daily as needed (as needed for headaches ( use at bedtime if possible )).  30 tablet  0  . folic acid (FOLVITE) 1 MG tablet Take 1 mg by mouth daily.        [name] glucose blood (ONE TOUCH TEST STRIPS) test strip Use as instructed to check blood sugar three time per day (250.00)  100 each  3  . metFORMIN (GLUCOPHAGE) 1000 MG tablet Take 1 tablet (1,000 mg total) by mouth 2 (two) times daily with a meal.  60 tablet  5  . Multiple Vitamin (MULTIVITAMIN)  tablet Take 1 tablet by mouth daily.        . Omega-3 Fatty Acids (FISH OIL) 1000 MG CAPS Take 1,000 capsules by mouth daily.        . ONE TOUCH LANCETS MISC Use to check blood sugar three time per day (250.00)  100 each  3  . simvastatin (ZOCOR) 40 MG tablet Take 1 tablet (40 mg total) by mouth at bedtime.  30 tablet  5  . DISCONTD: lisinopril (PRINIVIL,ZESTRIL) 10 MG tablet Take 1 tablet (10 mg total) by mouth daily.  30 tablet  0  . DISCONTD: valsartan (DIOVAN) 160 MG tablet Take 1/2 tablet by mouth once daily  30 tablet  5    BP 118/70  Pulse 88  Temp(Src) 98.2 F (36.8 C) (Oral)  Resp 16  Wt 145 lb (65.772 kg)  SpO2 99%    Objective:   Physical Exam  Constitutional: She appears well-developed and well-nourished. No distress.  HENT:  Head: Normocephalic and atraumatic.  Cardiovascular: Normal rate and regular rhythm.   No murmur heard. Pulmonary/Chest: Effort normal and breath sounds normal. No respiratory distress. She has no wheezes. She has no rales. She exhibits no tenderness.  Musculoskeletal: She exhibits no edema.  Skin: Skin is warm and dry.    Psychiatric: She has a normal mood and affect. Her behavior is normal. Judgment and thought content normal.          Assessment & Plan:

## 2012-03-01 NOTE — Telephone Encounter (Signed)
Pt left message stating she has been feeling weak and dizzy. Reports that she checked her BP at the pharmacy this morning and her reading was 88/65. Spoke with pt, she has already taken lisinopril this morning. Scheduled pt appt for today at 1:30pm.

## 2012-03-01 NOTE — Assessment & Plan Note (Signed)
Had low BP reading at [facility] and has felt weak, though BP here today is normal.  I question some mild dehydration.  I encourage her to drink plenty of fluids today.  Do not take lisinopril tomorrow, and to restart the following day at 5 mg.  Follow up in 1 month.

## 2012-03-02 ENCOUNTER — Telehealth: Payer: Self-pay | Admitting: *Deleted

## 2012-03-02 NOTE — Telephone Encounter (Signed)
Pt left voice message that her BP reading at the pharmacy today was 144/118. Wants to know if this could be because she is holding her BP today? Should she take 1/2 tablet today instead of waiting until tomorrow. Please advise.

## 2012-03-02 NOTE — Telephone Encounter (Signed)
Notified pt per instructions below, she states she has already taken 1/2 tablet. Advised her to take the other half and resume 1/2 tablet dosing tomorrow. Pt voices understanding.

## 2012-03-02 NOTE — Telephone Encounter (Signed)
Patient returned phone call. I told her exactly what Melissa said to do. She stated that she would take the full tablet today.

## 2012-03-02 NOTE — Telephone Encounter (Signed)
She should go ahead and take the full tablet today.

## 2012-03-18 ENCOUNTER — Ambulatory Visit: Payer: No Typology Code available for payment source | Admitting: Family

## 2012-04-01 ENCOUNTER — Ambulatory Visit (INDEPENDENT_AMBULATORY_CARE_PROVIDER_SITE_OTHER): Payer: No Typology Code available for payment source | Admitting: Family

## 2012-04-01 ENCOUNTER — Encounter: Payer: Self-pay | Admitting: Family

## 2012-04-01 VITALS — BP 128/70 | HR 74 | Temp 97.8°F | Resp 16 | Ht 63.5 in | Wt 146.0 lb

## 2012-04-01 DIAGNOSIS — I10 Essential (primary) hypertension: Secondary | ICD-10-CM

## 2012-04-01 DIAGNOSIS — J309 Allergic rhinitis, unspecified: Secondary | ICD-10-CM | POA: Insufficient documentation

## 2012-04-01 DIAGNOSIS — E119 Type 2 diabetes mellitus without complications: Secondary | ICD-10-CM

## 2012-04-01 LAB — BASIC METABOLIC PANEL WITH GFR
CO2: 28 mEq/L (ref 19–32)
Calcium: 10.2 mg/dL (ref 8.4–10.5)
Creat: 0.81 mg/dL (ref 0.50–1.10)
Glucose, Bld: 112 mg/dL — ABNORMAL HIGH (ref 70–99)

## 2012-04-01 LAB — HEMOGLOBIN A1C: Mean Plasma Glucose: 131 mg/dL — ABNORMAL HIGH (ref <117)

## 2012-04-01 MED ORDER — LISINOPRIL 10 MG PO TABS: 5.0000 mg | ORAL_TABLET | Freq: Every day | ORAL | Status: DC

## 2012-04-01 NOTE — Assessment & Plan Note (Signed)
Improved.  Obtain bmet.

## 2012-04-01 NOTE — Patient Instructions (Signed)
Please complete your lab work prior to leaving. Please schedule a follow up appointment in 3 months.  

## 2012-04-01 NOTE — Assessment & Plan Note (Signed)
Clinically stable.  Obtain A1C.  

## 2012-04-01 NOTE — Assessment & Plan Note (Signed)
Recommended trial of claritin.  °

## 2012-04-01 NOTE — Progress Notes (Signed)
Subjective:    Patient ID: Cathy [name], female    DOB: Jan 19, 1946, 66 y.o.   MRN: [id number]  HPI  [name] is a 66 yr old female who presents today for follow up of her HTN.  Last visit her lisinopril was decreased from 10mg  to 5 mg due to an episode of hypotension.  Since that time she reports feeling much better.  She denies chest pain, swelling, or lower extremity edema. She denies any further weakness or symptomatic hypotension.   Dm2- reports that she has been taking cinnamon pills.  She does not have a meter at home.  She continues metformin.  Nasal drainage- reports nasal drainage due to sinus.   Review of Systems See HPI  Past Medical History  Diagnosis Date  . Diabetes mellitus type II   . Hyperlipidemia   . Hypertension   . History of depression   . DJD (degenerative joint disease)   . Low back pain   . History of MRSA infection     recurrent carbuncle    History   Social History  . Marital Status: Single    Spouse Name: N/A    Number of Children: N/A  . Years of Education: N/A   Occupational History  . Not on file.   Social History Main Topics  . Smoking status: Former Games developer  . Smokeless tobacco: Never Used   Comment: quit 10-11 years ago light smoker  . Alcohol Use: Yes  . Drug Use: No  . Sexually Active: Not Currently    Birth Control/ Protection: Surgical   Other Topics Concern  . Not on file   Social History Narrative   SingleFormer Smoker  -  quit 10 to 11 years ago (light smoker)Alcohol use-yes  2 children Occupation: [employer]     Past Surgical History  Procedure Date  . Breast biopsy   . Appendectomy   . Abdominal hysterectomy   . Tubal ligation   . Inguinal hernia repair     Family History  Problem Relation Age of Onset  . Aneurysm Mother 60    deceased secondary to brain aneurysm  . Liver cancer Father 33    deceased  . Diabetes      grandmother    Allergies  Allergen Reactions  . Penicillins       REACTION: urticaria (hives)    Current Outpatient Prescriptions on File Prior to Visit  Medication Sig Dispense Refill  . aspirin 81 MG tablet Take 81 mg by mouth daily.        . Calcium Carbonate-Vitamin D (CALTRATE 600+D) 600-400 MG-UNIT per tablet Take 1 tablet by mouth daily.        [name] CINNAMON PO Take 2 capsules by mouth 2 (two) times daily.      . cyclobenzaprine (FLEXERIL) 5 MG tablet Take 1 tablet (5 mg total) by mouth daily as needed (as needed for headaches ( use at bedtime if possible )).  30 tablet  0  . folic acid (FOLVITE) 1 MG tablet Take 1 mg by mouth daily.        [name] glucose blood (ONE TOUCH TEST STRIPS) test strip Use as instructed to check blood sugar three time per day (250.00)  100 each  3  . metFORMIN (GLUCOPHAGE) 1000 MG tablet Take 1 tablet (1,000 mg total) by mouth 2 (two) times daily with a meal.  60 tablet  5  . Multiple Vitamin (MULTIVITAMIN) tablet Take 1 tablet by  mouth daily.        . Omega-3 Fatty Acids (FISH OIL) 1000 MG CAPS Take 1,000 capsules by mouth daily.        . ONE TOUCH LANCETS MISC Use to check blood sugar three time per day (250.00)  100 each  3  . simvastatin (ZOCOR) 40 MG tablet Take 1 tablet (40 mg total) by mouth at bedtime.  30 tablet  5  . DISCONTD: lisinopril (PRINIVIL,ZESTRIL) 10 MG tablet Take 5 mg by mouth daily.      [name] DISCONTD: valsartan (DIOVAN) 160 MG tablet Take 1/2 tablet by mouth once daily  30 tablet  5    BP 128/70  Pulse 74  Temp(Src) 97.8 F (36.6 C) (Oral)  Resp 16  Ht 5' 3.5" (1.613 m)  Wt 146 lb (66.225 kg)  BMI 25.46 kg/m2  SpO2 99%       Objective:   Physical Exam  Constitutional: She is oriented to person, place, and time. She appears well-developed and well-nourished. No distress.  HENT:  Head: Normocephalic and atraumatic.  Eyes: Conjunctivae are normal. Pupils are equal, round, and reactive to light.  Cardiovascular: Normal rate and regular rhythm.   No murmur heard. Pulmonary/Chest: Effort normal  and breath sounds normal. No respiratory distress. She has no wheezes. She has no rales. She exhibits no tenderness.  Musculoskeletal: She exhibits no edema.  Neurological: She is alert and oriented to person, place, and time.  Skin: Skin is warm and dry.  Psychiatric: She has a normal mood and affect. Her behavior is normal. Judgment and thought content normal.          Assessment & Plan:

## 2012-04-04 ENCOUNTER — Encounter: Payer: Self-pay | Admitting: Family

## 2012-04-11 ENCOUNTER — Encounter: Payer: Self-pay | Admitting: Family

## 2012-06-24 ENCOUNTER — Ambulatory Visit: Payer: Self-pay | Admitting: Family

## 2012-07-01 ENCOUNTER — Ambulatory Visit: Payer: Self-pay | Admitting: Family

## 2012-07-14 ENCOUNTER — Other Ambulatory Visit: Payer: Self-pay | Admitting: Family

## 2012-07-15 ENCOUNTER — Other Ambulatory Visit: Payer: Self-pay | Admitting: Family

## 2012-07-15 ENCOUNTER — Ambulatory Visit (INDEPENDENT_AMBULATORY_CARE_PROVIDER_SITE_OTHER): Payer: Self-pay | Admitting: Family

## 2012-07-15 ENCOUNTER — Encounter: Payer: Self-pay | Admitting: Family

## 2012-07-15 VITALS — BP 120/72 | HR 66 | Temp 97.7°F | Ht 63.5 in | Wt 142.0 lb

## 2012-07-15 DIAGNOSIS — R300 Dysuria: Secondary | ICD-10-CM | POA: Insufficient documentation

## 2012-07-15 DIAGNOSIS — E785 Hyperlipidemia, unspecified: Secondary | ICD-10-CM

## 2012-07-15 DIAGNOSIS — E119 Type 2 diabetes mellitus without complications: Secondary | ICD-10-CM

## 2012-07-15 DIAGNOSIS — I10 Essential (primary) hypertension: Secondary | ICD-10-CM

## 2012-07-15 DIAGNOSIS — K5900 Constipation, unspecified: Secondary | ICD-10-CM | POA: Insufficient documentation

## 2012-07-15 LAB — HEMOGLOBIN A1C: Hgb A1c MFr Bld: 6.1 % — ABNORMAL HIGH (ref <5.7)

## 2012-07-15 MED ORDER — METFORMIN HCL 1000 MG PO TABS: 1000.0000 mg | ORAL_TABLET | Freq: Two times a day (BID) | ORAL | Status: DC

## 2012-07-15 MED ORDER — SIMVASTATIN 40 MG PO TABS: 40.0000 mg | ORAL_TABLET | Freq: Every day | ORAL | Status: DC

## 2012-07-15 NOTE — Assessment & Plan Note (Signed)
Stable on simvastatin. Continue same.  

## 2012-07-15 NOTE — Assessment & Plan Note (Signed)
BP stable on decreased dose of lisinopril.  Continue same.

## 2012-07-15 NOTE — Assessment & Plan Note (Signed)
Pt with mild dysuria.  UA notes microscopic hematuria.  Will send for culture.  rx if + culture.

## 2012-07-15 NOTE — Assessment & Plan Note (Addendum)
Normal abdominal exam, no RLQ tenderness. She plans to try mag citrate for constipation today. I have asked pt to call us if symptoms recur after mag citrate. Consider abdominal imaging if recurrent/worsening symptoms.

## 2012-07-15 NOTE — Patient Instructions (Addendum)
Please complete lab work prior to leaving. Follow up in 3 months.  

## 2012-07-15 NOTE — Progress Notes (Signed)
Subjective:    Patient ID: Cathy [name], female    DOB: 07/05/1946, 67 y.o.   MRN: [id number]  HPI  Cathy [name] is a 66 yr old female who presents today for follow up.  1) HTN- she continues to feel good on reduced dose of lisinopril.  2) DM2- Denies polyuria.  Notes mild dysuria.  3) Hyperlipidemia- She continues simavastin.  4) Intermittent RLQ discomfort- reports + constipation recently.  No abdominal pain today.  Intermittent x 1 month.   Review of Systems See HPI  Past Medical History  Diagnosis Date  . Diabetes mellitus type II   . Hyperlipidemia   . Hypertension   . History of depression   . DJD (degenerative joint disease)   . Low back pain   . History of MRSA infection     recurrent carbuncle    History   Social History  . Marital Status: Single    Spouse Name: N/A    Number of Children: N/A  . Years of Education: N/A   Occupational History  . Not on file.   Social History Main Topics  . Smoking status: Former Games developer  . Smokeless tobacco: Never Used   Comment: quit 10-11 years ago light smoker  . Alcohol Use: Yes  . Drug Use: No  . Sexually Active: Not Currently    Birth Control/ Protection: Surgical   Other Topics Concern  . Not on file   Social History Narrative   SingleFormer Smoker  -  quit 10 to 11 years ago (light smoker)Alcohol use-yes  2 children Occupation: [employer]     Past Surgical History  Procedure Date  . Breast biopsy   . Appendectomy   . Abdominal hysterectomy   . Tubal ligation   . Inguinal hernia repair     Family History  Problem Relation Age of Onset  . Aneurysm Mother 64    deceased secondary to brain aneurysm  . Liver cancer Father 64    deceased  . Diabetes      grandmother    Allergies  Allergen Reactions  . Penicillins     REACTION: urticaria (hives)    Current Outpatient Prescriptions on File Prior to Visit  Medication Sig Dispense Refill  . aspirin 81 MG tablet Take 81 mg by  mouth daily.        . Calcium Carbonate-Vitamin D (CALTRATE 600+D) 600-400 MG-UNIT per tablet Take 1 tablet by mouth daily.        [name] CINNAMON PO Take 2 capsules by mouth 2 (two) times daily.      . cyclobenzaprine (FLEXERIL) 5 MG tablet Take 1 tablet (5 mg total) by mouth daily as needed (as needed for headaches ( use at bedtime if possible )).  30 tablet  0  . folic acid (FOLVITE) 1 MG tablet Take 1 mg by mouth daily.        [name] glucose blood (ONE TOUCH TEST STRIPS) test strip Use as instructed to check blood sugar three time per day (250.00)  100 each  3  . lisinopril (PRINIVIL,ZESTRIL) 10 MG tablet Take 0.5 tablets (5 mg total) by mouth daily.  90 tablet  1  . metFORMIN (GLUCOPHAGE) 1000 MG tablet Take 1 tablet (1,000 mg total) by mouth 2 (two) times daily with a meal.  60 tablet  5  . Multiple Vitamin (MULTIVITAMIN) tablet Take 1 tablet by mouth daily.        . Omega-3 Fatty Acids (FISH  OIL) 1000 MG CAPS Take 1,000 capsules by mouth daily.        . ONE TOUCH LANCETS MISC Use to check blood sugar three time per day (250.00)  100 each  3  . simvastatin (ZOCOR) 40 MG tablet Take 1 tablet (40 mg total) by mouth at bedtime.  30 tablet  5  . DISCONTD: valsartan (DIOVAN) 160 MG tablet Take 1/2 tablet by mouth once daily  30 tablet  5    BP 120/72  Pulse 66  Temp 97.7 F (36.5 C) (Oral)  Ht 5' 3.5" (1.613 m)  Wt 142 lb (64.411 kg)  BMI 24.76 kg/m2  SpO2 97%       Objective:   Physical Exam  Constitutional: She appears well-developed and well-nourished. No distress.  HENT:  Head: Normocephalic and atraumatic.  Cardiovascular: Normal rate and regular rhythm.   No murmur heard. Pulmonary/Chest: Effort normal and breath sounds normal. No respiratory distress. She has no wheezes. She has no rales. She exhibits no tenderness.  Abdominal: Soft. Bowel sounds are normal. She exhibits no distension and no mass. There is no tenderness. There is no rebound and no guarding.  Skin: Skin is warm and  dry.  Psychiatric: She has a normal mood and affect. Her behavior is normal. Judgment and thought content normal.          Assessment & Plan:

## 2012-07-15 NOTE — Assessment & Plan Note (Signed)
Clinically stable.  Obtain A1C.  

## 2012-07-16 LAB — HEPATIC FUNCTION PANEL
ALT: 11 U/L (ref 0–35)
Bilirubin, Direct: 0.1 mg/dL (ref 0.0–0.3)
Total Protein: 6.8 g/dL (ref 6.0–8.3)

## 2012-07-17 LAB — CULTURE, URINE COMPREHENSIVE
Colony Count: NO GROWTH
Organism ID, Bacteria: NO GROWTH

## 2012-07-18 ENCOUNTER — Encounter: Payer: Self-pay | Admitting: *Deleted

## 2012-08-16 ENCOUNTER — Emergency Department (HOSPITAL_COMMUNITY)
Admission: EM | Admit: 2012-08-16 | Discharge: 2012-08-16 | Disposition: A | Discharge status: Home / Self Care | Payer: Self-pay | Attending: Emergency Medicine | Admitting: Emergency Medicine

## 2012-08-16 DIAGNOSIS — I10 Essential (primary) hypertension: Secondary | ICD-10-CM | POA: Insufficient documentation

## 2012-08-16 DIAGNOSIS — Z880 Allergy status to penicillin: Secondary | ICD-10-CM | POA: Insufficient documentation

## 2012-08-16 DIAGNOSIS — Z8489 Family history of other specified conditions: Secondary | ICD-10-CM | POA: Insufficient documentation

## 2012-08-16 DIAGNOSIS — Z800 Family history of malignant neoplasm of digestive organs: Secondary | ICD-10-CM | POA: Insufficient documentation

## 2012-08-16 DIAGNOSIS — Z7982 Long term (current) use of aspirin: Secondary | ICD-10-CM | POA: Insufficient documentation

## 2012-08-16 DIAGNOSIS — Z833 Family history of diabetes mellitus: Secondary | ICD-10-CM | POA: Insufficient documentation

## 2012-08-16 DIAGNOSIS — Z87891 Personal history of nicotine dependence: Secondary | ICD-10-CM | POA: Insufficient documentation

## 2012-08-16 DIAGNOSIS — E119 Type 2 diabetes mellitus without complications: Secondary | ICD-10-CM | POA: Insufficient documentation

## 2012-08-16 DIAGNOSIS — G5620 Lesion of ulnar nerve, unspecified upper limb: Secondary | ICD-10-CM | POA: Insufficient documentation

## 2012-08-16 MED ORDER — IBUPROFEN 400 MG PO TABS: 400.0000 mg | ORAL_TABLET | Freq: Four times a day (QID) | ORAL | Status: DC | PRN

## 2012-08-16 NOTE — ED Provider Notes (Signed)
History     CSN: [id number]  Arrival date & time 08/16/12  1617   First MD Initiated Contact with Patient 08/16/12 1824      Chief Complaint  Patient presents with  . Numbness    (Consider location/radiation/quality/duration/timing/severity/associated sxs/prior treatment) The history is provided by the patient.   patient presents with some tingling in her left hand. She's had on and off for a week. She states it is worse with certain movements. It is worse also when she wakes up. She states she will shake it also does get better. The pain also goes up her forearm into her elbow. No trauma. No headache. Patient thinks may be her arthritis. No chest pain. No other numbness or weakness. She had some mild pain in her right neck last week, but none now.  Past Medical History  Diagnosis Date  . Diabetes mellitus type II   . Hyperlipidemia   . Hypertension   . History of depression   . DJD (degenerative joint disease)   . Low back pain   . History of MRSA infection     recurrent carbuncle    Past Surgical History  Procedure Date  . Breast biopsy   . Appendectomy   . Abdominal hysterectomy   . Tubal ligation   . Inguinal hernia repair     Family History  Problem Relation Age of Onset  . Aneurysm Mother 37    deceased secondary to brain aneurysm  . Liver cancer Father 14    deceased  . Diabetes      grandmother    History  Substance Use Topics  . Smoking status: Former Games developer  . Smokeless tobacco: Never Used   Comment: quit 10-11 years ago light smoker  . Alcohol Use: Yes    OB History    Grav Para Term Preterm Abortions TAB SAB Ect Mult Living   3 2 2  1  1   2       Review of Systems  Constitutional: Negative for activity change and appetite change.  HENT: Negative for neck stiffness.   Eyes: Negative for pain.  Respiratory: Negative for chest tightness and shortness of breath.   Cardiovascular: Negative for chest pain and leg swelling.  Gastrointestinal:  Negative for nausea, vomiting, abdominal pain and diarrhea.  Genitourinary: Negative for flank pain.  Musculoskeletal: Negative for back pain.  Skin: Negative for rash.  Neurological: Positive for numbness. Negative for weakness and headaches.  Psychiatric/Behavioral: Negative for behavioral problems.    Allergies  Penicillins  Home Medications   Current Outpatient Rx  Name Route Sig Dispense Refill  . ASPIRIN 81 MG PO TABS Oral Take 81 mg by mouth daily.      [name] CALCIUM CARBONATE-VITAMIN D 600-400 MG-UNIT PO TABS Oral Take 1 tablet by mouth daily.      [name] CINNAMON PO Oral Take 2 capsules by mouth 2 (two) times daily.    [name] FOLIC ACID 1 MG PO TABS Oral Take 1 mg by mouth daily.      [name] GLUCOSE BLOOD VI STRP  Use as instructed to check blood sugar three time per day (250.00) 100 each 3  . LISINOPRIL 10 MG PO TABS Oral Take 5 mg by mouth daily.    [name] METFORMIN HCL 1000 MG PO TABS Oral Take 1,000 mg by mouth 2 (two) times daily with a meal.    . ONE-DAILY MULTI VITAMINS PO TABS Oral Take 1 tablet by mouth daily.      [name]  FISH OIL 1000 MG PO CAPS Oral Take 1,000 capsules by mouth daily.      Letta Pate LANCETS MISC  Use to check blood sugar three time per day (250.00) 100 each 3  . SIMVASTATIN 40 MG PO TABS Oral Take 40 mg by mouth at bedtime.    . IBUPROFEN 400 MG PO TABS Oral Take 1 tablet (400 mg total) by mouth every 6 (six) hours as needed for pain. 10 tablet 0    BP 117/77  Pulse 79  Temp 98 F (36.7 C) (Oral)  Resp 16  SpO2 99%  Physical Exam  Nursing note and vitals reviewed. Constitutional: She is oriented to person, place, and time. She appears well-developed and well-nourished.  HENT:  Head: Normocephalic and atraumatic.  Eyes: EOM are normal. Pupils are equal, round, and reactive to light.  Neck: Normal range of motion. Neck supple.  Cardiovascular: Normal rate, regular rhythm and normal heart sounds.   No murmur heard. Pulmonary/Chest: Effort normal and breath sounds  normal. No respiratory distress. She has no wheezes. She has no rales.  Abdominal: Soft. Bowel sounds are normal. She exhibits no distension. There is no tenderness. There is no rebound and no guarding.  Musculoskeletal: Normal range of motion.  Neurological: She is alert and oriented to person, place, and time. No cranial nerve deficit.       Paresthesias to left hand over the fifth finger and half of fourth finger. Worse after palpation to medial aspect of elbow. Good cap refill. Good strength intact. Muscle strength intact over median radial and ulnar nerve distribution. Strong radial pulse.  Skin: Skin is warm and dry.  Psychiatric: She has a normal mood and affect. Her speech is normal.    ED Course  Procedures (including critical care time)  Labs Reviewed - No data to display No results found.   1. Ulnar nerve neuropathy       MDM  Patient with tingling in her left hand. No weakness. Also some arm soreness. The tingling is reproducible with palpation over her elbow medially. It is likely irritation of the ulnar nerve. She be treated with anti-inflammatories and follow with her primary care Dr. I do not believe this is a stroke.        Juliet Rude. Rubin Payor, MD 08/17/12 0002

## 2012-08-16 NOTE — ED Notes (Signed)
Pt reports c/o left hand numbness and left arm soreness and pain x1 week.  Pt states "i think it may be arthritis, its worse in the morning."  Pt denies cp, nv.  Pt denies HA or blurry vision.  Pt aaox3. Pt denies cardiac hx.

## 2012-08-16 NOTE — ED Notes (Signed)
Pt reports numbness/parasthesias to left fingers and hand and pain to upper left arm. 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  Pt denies injury to neck.  Pt denies injury to arm recently. Pt reports as a child her left elbow went through glass and old scarring present. Pt denies history of issues with left arm until recently. Pt denies current numbness of tingling at this time.

## 2012-08-24 ENCOUNTER — Other Ambulatory Visit: Payer: Self-pay | Admitting: Family

## 2012-08-24 DIAGNOSIS — Z1231 Encounter for screening mammogram for malignant neoplasm of breast: Secondary | ICD-10-CM

## 2012-09-28 ENCOUNTER — Ambulatory Visit (HOSPITAL_COMMUNITY)
Admission: RE | Admit: 2012-09-28 | Discharge: 2012-09-28 | Disposition: A | Discharge status: Home / Self Care | Payer: Self-pay | Source: Ambulatory Visit | Attending: Family | Admitting: Family

## 2012-09-28 DIAGNOSIS — Z1231 Encounter for screening mammogram for malignant neoplasm of breast: Secondary | ICD-10-CM | POA: Insufficient documentation

## 2012-10-12 ENCOUNTER — Telehealth: Payer: Self-pay | Admitting: Family

## 2012-10-13 ENCOUNTER — Encounter: Payer: Self-pay | Admitting: Internal Medicine

## 2012-10-13 ENCOUNTER — Ambulatory Visit (INDEPENDENT_AMBULATORY_CARE_PROVIDER_SITE_OTHER): Payer: Self-pay | Admitting: Internal Medicine

## 2012-10-13 ENCOUNTER — Telehealth: Payer: Self-pay | Admitting: Family Medicine

## 2012-10-13 VITALS — BP 108/64 | HR 80 | Temp 97.8°F | Wt 138.0 lb

## 2012-10-13 DIAGNOSIS — R300 Dysuria: Secondary | ICD-10-CM

## 2012-10-13 DIAGNOSIS — N390 Urinary tract infection, site not specified: Secondary | ICD-10-CM | POA: Insufficient documentation

## 2012-10-13 DIAGNOSIS — E119 Type 2 diabetes mellitus without complications: Secondary | ICD-10-CM

## 2012-10-13 DIAGNOSIS — I10 Essential (primary) hypertension: Secondary | ICD-10-CM

## 2012-10-13 LAB — POCT URINALYSIS DIPSTICK
Glucose, UA: NEGATIVE
Nitrite, UA: NEGATIVE
Spec Grav, UA: 1.015
Urobilinogen, UA: 0.2
pH, UA: 6.5

## 2012-10-13 MED ORDER — METFORMIN HCL 1000 MG PO TABS: 1000.0000 mg | ORAL_TABLET | Freq: Two times a day (BID) | ORAL | Status: DC

## 2012-10-13 MED ORDER — SIMVASTATIN 40 MG PO TABS: 40.0000 mg | ORAL_TABLET | Freq: Every day | ORAL | Status: DC

## 2012-10-13 MED ORDER — SULFAMETHOXAZOLE-TMP DS 800-160 MG PO TABS: 1.0000 | ORAL_TABLET | Freq: Two times a day (BID) | ORAL | Status: DC

## 2012-10-13 MED ORDER — LISINOPRIL 10 MG PO TABS: 5.0000 mg | ORAL_TABLET | Freq: Every day | ORAL | Status: DC

## 2012-10-13 NOTE — Assessment & Plan Note (Signed)
Stable.  Continue lisinopril.  Monitor BMET.

## 2012-10-13 NOTE — Patient Instructions (Addendum)
Please return on Oct 24, 2012 for your blood work

## 2012-10-13 NOTE — Assessment & Plan Note (Addendum)
66 year old [place] American female with urinary tract infection. Treat with Bactrim DS twice daily for 5 days.  Patient advised to call office if symptoms persist or worsen.  Send urine for culture

## 2012-10-13 NOTE — Telephone Encounter (Signed)
[facility] Triage Call Report Triage Record Num: [id number] Operator: Jeraldine Loots Patient Name: Cathy [name] Call Date & Time: 10/12/2012 5:41:16PM Patient Phone: [phone] PCP: Thomos Lemons Patient Gender: Female PCP Fax : [phone] Patient DOB: Oct 31, 1946 [facility] Name: [facility] Reason for Call: Having pressure in lower abdominal and lower back pain with frequent urination. No blood in the urine. No fever. Blood sugar was 128mg . Needs to be seen in 4 hours per Urinary Sx Protocal. She refuses to go to UC or ED this evening. Home care given. Protocol(s) Used: Urinary Symptoms - Female Recommended Outcome per Protocol: See Provider within 4 hours Reason for Outcome: Urinary tract symptoms AND any flank or low back pain Care Advice: ~ SYMPTOM / CONDITION MANAGEMENT 11/

## 2012-10-13 NOTE — Progress Notes (Signed)
Subjective:    Patient ID: Cathy [name], female    DOB: July 18, 1946, 66 y.o.   MRN: [id number]  HPI  66 year old African American female with history of type 2 diabetes and hypertension complains of mild urinary frequency and dysuria over the last several days. She denies fever or flank pain.  Type II diabetes-she reports her blood sugars have been stable. She is taking metformin without difficulty.  Hypertension-stable  Review of Systems Negative for chest pain or shortness of breath  Past Medical History  Diagnosis Date  . Diabetes mellitus type II   . Hyperlipidemia   . Hypertension   . History of depression   . DJD (degenerative joint disease)   . Low back pain   . History of MRSA infection     recurrent carbuncle    History   Social History  . Marital Status: Single    Spouse Name: N/A    Number of Children: N/A  . Years of Education: N/A   Occupational History  . Not on file.   Social History Main Topics  . Smoking status: Former Games developer  . Smokeless tobacco: Never Used     Comment: quit 10-11 years ago light smoker  . Alcohol Use: Yes  . Drug Use: No  . Sexually Active: Not Currently    Birth Control/ Protection: Surgical   Other Topics Concern  . Not on file   Social History Narrative   SingleFormer Smoker  -  quit 10 to 11 years ago (light smoker)Alcohol use-yes  2 children Occupation: [employer]     Past Surgical History  Procedure Date  . Breast biopsy   . Appendectomy   . Abdominal hysterectomy   . Tubal ligation   . Inguinal hernia repair     Family History  Problem Relation Age of Onset  . Aneurysm Mother 61    deceased secondary to brain aneurysm  . Liver cancer Father 12    deceased  . Diabetes      grandmother    Allergies  Allergen Reactions  . Penicillins     REACTION: urticaria (hives)    Current Outpatient Prescriptions on File Prior to Visit  Medication Sig Dispense Refill  . aspirin 81 MG tablet  Take 81 mg by mouth daily.        . Calcium Carbonate-Vitamin D (CALTRATE 600+D) 600-400 MG-UNIT per tablet Take 1 tablet by mouth daily.        . folic acid (FOLVITE) 1 MG tablet Take 1 mg by mouth daily.        [name] glucose blood (ONE TOUCH TEST STRIPS) test strip Use as instructed to check blood sugar three time per day (250.00)  100 each  3  . Multiple Vitamin (MULTIVITAMIN) tablet Take 1 tablet by mouth daily.        . Omega-3 Fatty Acids (FISH OIL) 1000 MG CAPS Take 1,000 capsules by mouth daily.        . ONE TOUCH LANCETS MISC Use to check blood sugar three time per day (250.00)  100 each  3  . [DISCONTINUED] lisinopril (PRINIVIL,ZESTRIL) 10 MG tablet Take 5 mg by mouth daily.      . [DISCONTINUED] metFORMIN (GLUCOPHAGE) 1000 MG tablet Take 1,000 mg by mouth 2 (two) times daily with a meal.      . [DISCONTINUED] simvastatin (ZOCOR) 40 MG tablet Take 40 mg by mouth at bedtime.      . [DISCONTINUED] lisinopril (PRINIVIL,ZESTRIL) 10 MG  tablet Take 0.5 tablets (5 mg total) by mouth daily.  90 tablet  1  . [DISCONTINUED] valsartan (DIOVAN) 160 MG tablet Take 1/2 tablet by mouth once daily  30 tablet  5    BP 108/64  Pulse 80  Temp 97.8 F (36.6 C) (Oral)  Wt 138 lb (62.596 kg)       Objective:   Physical Exam  Constitutional: She appears well-developed and well-nourished.  Cardiovascular: Normal rate, regular rhythm and normal heart sounds.   Pulmonary/Chest: Effort normal and breath sounds normal. She has no wheezes.  Abdominal: Soft. Bowel sounds are normal.       No flank tenderness  Skin: Skin is warm and dry.  Psychiatric: She has a normal mood and affect. Her behavior is normal.          Assessment & Plan:

## 2012-10-13 NOTE — Assessment & Plan Note (Signed)
Stable and well-controlled on metformin 1 g twice daily. Monitor A1c and microalbumin to creatinine ratio.

## 2012-10-16 LAB — URINE CULTURE: Colony Count: >=100000

## 2012-10-17 ENCOUNTER — Telehealth: Payer: Self-pay | Admitting: Internal Medicine

## 2012-10-17 ENCOUNTER — Ambulatory Visit: Payer: Self-pay | Admitting: Internal Medicine

## 2012-10-17 NOTE — Telephone Encounter (Signed)
[facility] Triage Call Report Triage Record Num: [id number] Operator: Hyman Bower Patient Name: Cathy [name] Call Date & Time: 10/15/2012 3:50:08PM Patient Phone: [phone] PCP: Thomos Lemons Patient Gender: Female PCP Fax : [phone] Patient DOB: [birth year] [facility] Name: [facility]  Reason for Call: Caller: Elease Hashimoto (alt [phone])/Patient; PCP: Artist Pais Doe-Hyun Molly Maduro) (Adults only); CB#: [phone]; Reports vaginal itching after taking Bactrim for UTI since 10/13/12. Onset 10/14/12. Reports she is not sexually active at this time. Triaged per Vaginal Discharge or Irritation guideline, disposition: see provider within 24 hours (downgraded due to standing orders) for "Genital itching, burning or redness." Per standing orders called in Diflucan 150mg  x 1 po, no refills. Pharmacy: [facility] [address], Phone number: [phone]. Care advice and call back parameters given per guideline. Patient verbalized understanding.  Protocol(s) Used: Vaginal Discharge or Irritation  Recommended Outcome per Protocol: See Provider within 24 hours  Reason for Outcome: Genital itching, burning or redness  Care Advice: ~ Keep perineum clean and dry. ~ Call provider if symptoms worsen or new symptoms develop.

## 2012-10-24 ENCOUNTER — Other Ambulatory Visit (INDEPENDENT_AMBULATORY_CARE_PROVIDER_SITE_OTHER): Payer: Self-pay

## 2012-10-24 DIAGNOSIS — R300 Dysuria: Secondary | ICD-10-CM

## 2012-10-24 DIAGNOSIS — E119 Type 2 diabetes mellitus without complications: Secondary | ICD-10-CM

## 2012-10-24 DIAGNOSIS — I10 Essential (primary) hypertension: Secondary | ICD-10-CM

## 2012-10-24 LAB — HEPATIC FUNCTION PANEL
ALT: 17 U/L (ref 0–35)
AST: 19 U/L (ref 0–37)
Alkaline Phosphatase: 42 U/L (ref 39–117)
Total Bilirubin: 0.7 mg/dL (ref 0.3–1.2)

## 2012-10-24 LAB — LIPID PANEL
Cholesterol: 179 mg/dL (ref 0–200)
HDL: 70.1 mg/dL (ref >39.00)
LDL Cholesterol: 97 mg/dL (ref 0–99)
Total CHOL/HDL Ratio: 3
Triglycerides: 62 mg/dL (ref 0.0–149.0)
VLDL: 12.4 mg/dL (ref 0.0–40.0)

## 2012-10-24 LAB — BASIC METABOLIC PANEL
BUN: 15 mg/dL (ref 6–23)
CO2: 27 mEq/L (ref 19–32)
Chloride: 100 mEq/L (ref 96–112)
Creatinine, Ser: 0.7 mg/dL (ref 0.4–1.2)
Potassium: 4.1 mEq/L (ref 3.5–5.1)

## 2012-10-24 LAB — HEMOGLOBIN A1C: Hgb A1c MFr Bld: 6.4 % (ref 4.6–6.5)

## 2012-10-25 ENCOUNTER — Telehealth: Payer: Self-pay | Admitting: Internal Medicine

## 2012-10-25 NOTE — Telephone Encounter (Signed)
FYI only.

## 2012-10-25 NOTE — Telephone Encounter (Signed)
Caller: Armida/Patient; Phone: [phone]; Reason for Call: Patient calling back about Hemoglobin A1C.  States received a call from the office 10/25/12 and was told it was up to 6.  4 from 6.  1 3 months ago, and believes it may be because of the types of foods she enjoys during the holidays.  Advised A1C is more a measure of long-term habits, and to monitor her carb counts.  Coaching done regarding carb counts and to avoid obvious items (patient cited macaroni and cheese as an example).  To continue metformin as directed, and not exceed 25gm of total carbohydrate per meal.  No further questions or concerns at this time.  Krs/can

## 2012-11-02 ENCOUNTER — Ambulatory Visit (INDEPENDENT_AMBULATORY_CARE_PROVIDER_SITE_OTHER): Payer: Self-pay

## 2012-11-02 DIAGNOSIS — Z23 Encounter for immunization: Secondary | ICD-10-CM

## 2012-11-03 ENCOUNTER — Ambulatory Visit: Payer: Self-pay | Admitting: Internal Medicine

## 2013-01-24 ENCOUNTER — Telehealth: Payer: Self-pay | Admitting: Internal Medicine

## 2013-01-24 NOTE — Telephone Encounter (Signed)
Patient Information:  Caller Name: Cathy [name]  Phone: [phone]  Patient: Cathy, [name]  Gender: Female  DOB: 1946-04-16  Age: 67 Years  PCP: Cathy [name]) (Adults only)  Office Follow Up:  Does the office need to follow up with this patient?: No  Instructions For The Office: N/A   Symptoms  Reason For Call & Symptoms: Patient calling, has alot of urinary pressure and frequency "since the other day".  When questioned about this she said about 3 days ago.  Her blood sugars have been 87 to 99mg .  Denies any blood in the urine.  Reviewed Health History In EMR: Yes  Reviewed Medications In EMR: Yes  Reviewed Allergies In EMR: Yes  Reviewed Surgeries / Procedures: Yes  Date of Onset of Symptoms: 01/21/2013  Guideline(s) Used:  Urination Pain - Female  Disposition Per Guideline:   See Today in Office  Reason For Disposition Reached:   Age > 50 years  Advice Given:  N/A  Patient Refused Recommendation:  Patient Will Make Own Appointment  No transportation today.  No appts available today.  No appts open tomorrow after 2p with Dr. Artist Pais.  She works daily until 2p.  Please call and schedule an appt.  She refused UC today due to no insurance, has [employer] and no transportation.

## 2013-01-25 ENCOUNTER — Other Ambulatory Visit (INDEPENDENT_AMBULATORY_CARE_PROVIDER_SITE_OTHER): Payer: BC Managed Care – PPO

## 2013-01-25 DIAGNOSIS — N390 Urinary tract infection, site not specified: Secondary | ICD-10-CM

## 2013-01-25 LAB — POCT URINALYSIS DIPSTICK
Blood, UA: NEGATIVE
Nitrite, UA: NEGATIVE
Protein, UA: NEGATIVE
Spec Grav, UA: 1.015
Urobilinogen, UA: 0.2
pH, UA: 5.5

## 2013-01-25 MED ORDER — CIPROFLOXACIN HCL 500 MG PO TABS: 500.0000 mg | ORAL_TABLET | Freq: Two times a day (BID) | ORAL | Status: DC

## 2013-01-25 NOTE — Telephone Encounter (Signed)
Pt is coming in for a urine

## 2013-01-25 NOTE — Telephone Encounter (Signed)
If pt can drop off urine sample for ua and culture.  We can call in cipro 500mg  # 10, one po bid.  No refills.  If she develops fever and or back pain, she needs to go to ER.

## 2013-01-25 NOTE — Telephone Encounter (Signed)
Left message for pt to call back  °

## 2013-01-27 LAB — URINE CULTURE: Colony Count: >=100000

## 2013-02-04 ENCOUNTER — Ambulatory Visit (INDEPENDENT_AMBULATORY_CARE_PROVIDER_SITE_OTHER): Payer: BC Managed Care – PPO | Admitting: Family Medicine

## 2013-02-04 ENCOUNTER — Encounter: Payer: Self-pay | Admitting: Family Medicine

## 2013-02-04 VITALS — BP 112/70 | HR 82 | Temp 97.9°F | Resp 18 | Wt 141.1 lb

## 2013-02-04 DIAGNOSIS — G5601 Carpal tunnel syndrome, right upper limb: Secondary | ICD-10-CM

## 2013-02-04 DIAGNOSIS — K219 Gastro-esophageal reflux disease without esophagitis: Secondary | ICD-10-CM

## 2013-02-04 DIAGNOSIS — G5600 Carpal tunnel syndrome, unspecified upper limb: Secondary | ICD-10-CM

## 2013-02-04 DIAGNOSIS — E119 Type 2 diabetes mellitus without complications: Secondary | ICD-10-CM

## 2013-02-04 DIAGNOSIS — R42 Dizziness and giddiness: Secondary | ICD-10-CM

## 2013-02-04 DIAGNOSIS — R079 Chest pain, unspecified: Secondary | ICD-10-CM

## 2013-02-04 NOTE — Progress Notes (Signed)
Nature conservation officer at [facility] [address] [place] [address] Phone: [phone] Fax: [phone]  Date:  02/04/2013   Name:  Cathy [name]   DOB:  1946-09-30   MRN:  [id number] Gender: female Age: 67 y.o.  Primary Physician:  Cathy Lemons, DO  Evaluating MD: Cathy Beat, MD   Chief Complaint: Dizziness and Gastrophageal Reflux   History of Present Illness:  Cathy [name] is a 67 y.o. 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 Like she was going to pass out, but she did not. Chest was hurting. Started yesterday, but it is gone now. It got a little worse this morning when she ate a chicken biscuit.  The dizziness is her primary complaint.  Also the right hand is dizzy and different and a little tingly 1-3 digits. R CTS: CTS splint  Reflux: ate a chicken biscuit before breakfast.  Dyspepsia.    She also brings up some posterior neck muscle pain.  Patient Active Problem List  Diagnosis  . DIABETES MELLITUS, TYPE II  . DYSLIPIDEMIA  . CARPAL TUNNEL SYNDROME  . HYPERTENSION  . DEPRESSION, HX OF  . Allergic rhinitis  . Constipation  . UTI (lower urinary tract infection)    Past Medical History  Diagnosis Date  . Diabetes mellitus type II   . Hyperlipidemia   . Hypertension   . History of depression   . DJD (degenerative joint disease)   . Low back pain   . History of MRSA infection     recurrent carbuncle    Past Surgical History  Procedure Laterality Date  . Breast biopsy    . Appendectomy    . Abdominal hysterectomy    . Tubal ligation    . Inguinal hernia repair      History   Social History  . Marital Status: Single    Spouse Name: N/A    Number of Children: N/A  . Years of Education: N/A   Occupational History  . Not on file.   Social History Main Topics  . Smoking  status: Former Games developer  . Smokeless tobacco: Never Used     Comment: quit 10-11 years ago light smoker  . Alcohol Use: Yes  . Drug Use: No  . Sexually Active: Not Currently    Birth Control/ Protection: Surgical   Other Topics Concern  . Not on file   Social History Narrative   Single   Former Smoker  -  quit 10 to 11 years ago (light smoker)   Alcohol use-yes     2 children    Occupation: [employer]     Family History  Problem Relation Age of Onset  . Aneurysm Mother 83    deceased secondary to brain aneurysm  . Liver cancer Father 57    deceased  . Diabetes      grandmother    Allergies  Allergen Reactions  . Penicillins     REACTION: urticaria (hives)    Medication list has been reviewed and updated.  Outpatient Prescriptions Prior to Visit  Medication Sig Dispense Refill  . aspirin 81 MG tablet Take 81 mg by mouth daily.        . Calcium Carbonate-Vitamin D (CALTRATE 600+D) 600-400 MG-UNIT per tablet Take 1 tablet by mouth daily.        [name]  ciprofloxacin (CIPRO) 500 MG tablet Take 1 tablet (500 mg total) by mouth 2 (two) times daily.  20 tablet  0  . folic acid (FOLVITE) 1 MG tablet Take 1 mg by mouth daily.        [name] glucose blood (ONE TOUCH TEST STRIPS) test strip Use as instructed to check blood sugar three time per day (250.00)  100 each  3  . lisinopril (PRINIVIL,ZESTRIL) 10 MG tablet Take 0.5 tablets (5 mg total) by mouth daily.  90 tablet  1  . metFORMIN (GLUCOPHAGE) 1000 MG tablet Take 1 tablet (1,000 mg total) by mouth 2 (two) times daily with a meal.  180 tablet  1  . Multiple Vitamin (MULTIVITAMIN) tablet Take 1 tablet by mouth daily.        . Omega-3 Fatty Acids (FISH OIL) 1000 MG CAPS Take 1,000 capsules by mouth daily.        . ONE TOUCH LANCETS MISC Use to check blood sugar three time per day (250.00)  100 each  3  . simvastatin (ZOCOR) 40 MG tablet Take 1 tablet (40 mg total) by mouth at bedtime.  90 tablet  1  .  sulfamethoxazole-trimethoprim (BACTRIM DS) 800-160 MG per tablet Take 1 tablet by mouth 2 (two) times daily.  10 tablet  0   No facility-administered medications prior to visit.    Review of Systems:   GEN: No acute illnesses, no fevers, chills. GI: No n/v/d, eating normally Pulm: No SOB Interactive and getting along well at home.  Otherwise, ROS is as per the HPI.   Physical Examination: Filed Vitals:   02/04/13 1054 02/04/13 1135 02/04/13 1143 02/04/13 1144  BP: 118/80 112/80 130/74 112/70  Pulse: 82     Temp: 97.9 F (36.6 C)     TempSrc: Oral     Resp: 18     Weight: 141 lb 1.3 oz (63.993 kg)     SpO2: 98%        Ideal Body Weight:     GEN: WDWN, NAD, Non-toxic, A & O x 3 HEENT: Atraumatic, Normocephalic. Neck supple. No masses, No LAD. Ears and Nose: No external deformity. CV: RRR, No M/G/R. No JVD. No thrill. No extra heart sounds. PULM: CTA B, no wheezes, crackles, rhonchi. No retractions. No resp. distress. No accessory muscle use. EXTR: No c/c/e NEURO Normal gait.  PSYCH: Normally interactive. Conversant. Not depressed or anxious appearing.  Calm demeanor.    Hand: R Ecchymosis or edema: neg ROM wrist/hand/digits/elbow: full  Carpals, MCP's, digits: NT Distal Ulna and Radius: NT Supination lift test: neg Ecchymosis or edema: neg Cysts/nodules: neg Finkelstein's test: neg Snuffbox tenderness: neg Scaphoid tubercle: NT Hook of Hamate: NT Resisted supination: NT Full composite fist Grip, all digits: 5/5 str No tenosynovitis Axial load test: neg Phalen's: Immediately + on R, and neg on the L Tinel's: + on the R, not on the L Atrophy: neg  Hand sensation: intact   Assessment and Plan:  Dizziness and giddiness: patient did drop BP almost 20 points systolic with standing. Encouraged hydration over the weekend and eating regular meals, since on DM meds. (Skips sometimes)  Chest pain, unspecified - Plan: EKG 12-Lead  EKG: Normal sinus rhythm.  Normal axis, no Q waves, No acute ST elevation or depression.  Currently without symptoms, worse with eating (chicken biscuit) and reflux symptoms. Multiple cardiac risk factors. If CP returns at all, f/u next week with PCP and consider stress test.  Severe CP to ER.  DIABETES MELLITUS, TYPE II - Plan: POCT glucose (manual entry)  Esophageal reflux: given 30 days of Nexium from office, ? 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 If remains symptomatic, would try injection.   Results for orders placed in visit on 02/04/13  GLUCOSE, POCT (MANUAL RESULT ENTRY)      Result Value Range   POC Glucose 106 (*) 70 - 99 mg/dl     Orders Today:  Orders Placed This Encounter  Procedures  . POCT glucose (manual entry)  . EKG 12-Lead    Updated Medication List: (Includes new medications, updates to list, dose adjustments) No orders of the defined types were placed in this encounter.    Medications Discontinued: Medications Discontinued During This Encounter  Medication Reason  . sulfamethoxazole-trimethoprim (BACTRIM DS) 800-160 MG per tablet Completed Course      Signed, Tosh Glaze T. Yolande Skoda, MD 02/04/2013 11:16 AM

## 2013-03-03 ENCOUNTER — Telehealth: Payer: Self-pay | Admitting: Internal Medicine

## 2013-03-03 NOTE — Telephone Encounter (Signed)
Pt states that she believes it is time for her to have blood work done. I checked in her last note from 10/13/12, but the only instructions where to return for labs in December, which she did. Please assist.

## 2013-03-06 NOTE — Telephone Encounter (Signed)
Pt is sch for 03-09-13 10am

## 2013-03-06 NOTE — Telephone Encounter (Signed)
Please schedule BMET - 401.9 and A1c - 790.29

## 2013-03-09 ENCOUNTER — Other Ambulatory Visit (INDEPENDENT_AMBULATORY_CARE_PROVIDER_SITE_OTHER): Payer: BC Managed Care – PPO

## 2013-03-09 DIAGNOSIS — I10 Essential (primary) hypertension: Secondary | ICD-10-CM

## 2013-03-09 DIAGNOSIS — R7309 Other abnormal glucose: Secondary | ICD-10-CM

## 2013-03-09 LAB — BASIC METABOLIC PANEL
CO2: 31 mEq/L (ref 19–32)
Chloride: 101 mEq/L (ref 96–112)
GFR: 91.44 mL/min (ref >60.00)
Glucose, Bld: 100 mg/dL — ABNORMAL HIGH (ref 70–99)
Potassium: 4 mEq/L (ref 3.5–5.1)
Sodium: 138 mEq/L (ref 135–145)

## 2013-03-20 ENCOUNTER — Telehealth: Payer: Self-pay | Admitting: Internal Medicine

## 2013-03-20 NOTE — Telephone Encounter (Signed)
Patient called stating that she would like a call back with lab results or a copy mailed to her. Please assist.

## 2013-03-21 NOTE — Telephone Encounter (Signed)
Labs normal, mailed copy, pt aware

## 2013-03-28 ENCOUNTER — Telehealth: Payer: Self-pay | Admitting: Internal Medicine

## 2013-03-28 NOTE — Telephone Encounter (Signed)
Left message for pt to call back  °

## 2013-03-28 NOTE — Telephone Encounter (Signed)
Pt wanted to make sure her glucose being 100 was ok.  Told pt no need to worry it was fine and had actually come down.  Pt verbalized understanding and had no questions

## 2013-03-28 NOTE — Telephone Encounter (Signed)
Pt would like to ask you something about her lab results. Pls call on cell phone.

## 2013-04-18 ENCOUNTER — Encounter: Payer: Self-pay | Admitting: Internal Medicine

## 2013-04-18 ENCOUNTER — Ambulatory Visit (INDEPENDENT_AMBULATORY_CARE_PROVIDER_SITE_OTHER): Payer: BC Managed Care – PPO | Admitting: Internal Medicine

## 2013-04-18 ENCOUNTER — Telehealth: Payer: Self-pay | Admitting: Internal Medicine

## 2013-04-18 VITALS — BP 130/72 | Temp 98.3°F | Wt 140.0 lb

## 2013-04-18 DIAGNOSIS — R350 Frequency of micturition: Secondary | ICD-10-CM

## 2013-04-18 DIAGNOSIS — N390 Urinary tract infection, site not specified: Secondary | ICD-10-CM

## 2013-04-18 LAB — POCT URINALYSIS DIPSTICK
Bilirubin, UA: NEGATIVE
Glucose, UA: NEGATIVE
Spec Grav, UA: 1.015
Urobilinogen, UA: 0.2

## 2013-04-18 MED ORDER — ESTROGENS, CONJUGATED 0.625 MG/GM VA CREA: TOPICAL_CREAM | VAGINAL | Status: DC

## 2013-04-18 MED ORDER — SULFAMETHOXAZOLE-TRIMETHOPRIM 800-160 MG PO TABS: 1.0000 | ORAL_TABLET | Freq: Two times a day (BID) | ORAL | Status: DC

## 2013-04-18 NOTE — Progress Notes (Signed)
Subjective:    Patient ID: Cathy [name], female    DOB: 1946-02-17, 67 y.o.   MRN: [id number]  HPI  67 year old African American female with history of mild type 2 diabetes, hypertension and frequent UTI presents with urinary frequency and abdominal pressure x 1 week. She denies any fever or chills. She denies any flank pain. She has UTIs every 1 or 2 months. Her symptoms not related to sexual activity.  Patient has history of partial hysterectomy in the past. She is in menopause.  Negative family history for breast cancer.  Review of Systems Negative for fever chills    Past Medical History  Diagnosis Date  . Diabetes mellitus type II   . Hyperlipidemia   . Hypertension   . History of depression   . DJD (degenerative joint disease)   . Low back pain   . History of MRSA infection     recurrent carbuncle    History   Social History  . Marital Status: Single    Spouse Name: N/A    Number of Children: N/A  . Years of Education: N/A   Occupational History  . Not on file.   Social History Main Topics  . Smoking status: Former Games developer  . Smokeless tobacco: Never Used     Comment: quit 10-11 years ago light smoker  . Alcohol Use: Yes  . Drug Use: No  . Sexually Active: Not Currently    Birth Control/ Protection: Surgical   Other Topics Concern  . Not on file   Social History Narrative   Single   Former Smoker  -  quit 10 to 11 years ago (light smoker)   Alcohol use-yes     2 children    Occupation: [employer]     Past Surgical History  Procedure Laterality Date  . Breast biopsy    . Appendectomy    . Abdominal hysterectomy    . Tubal ligation    . Inguinal hernia repair      Family History  Problem Relation Age of Onset  . Aneurysm Mother 7    deceased secondary to brain aneurysm  . Liver cancer Father 50    deceased  . Diabetes      grandmother    Allergies  Allergen Reactions  . Penicillins     REACTION: urticaria (hives)     Current Outpatient Prescriptions on File Prior to Visit  Medication Sig Dispense Refill  . aspirin 81 MG tablet Take 81 mg by mouth daily.        . Calcium Carbonate-Vitamin D (CALTRATE 600+D) 600-400 MG-UNIT per tablet Take 1 tablet by mouth daily.        . folic acid (FOLVITE) 1 MG tablet Take 1 mg by mouth daily.        [name] glucose blood (ONE TOUCH TEST STRIPS) test strip Use as instructed to check blood sugar three time per day (250.00)  100 each  3  . lisinopril (PRINIVIL,ZESTRIL) 10 MG tablet Take 0.5 tablets (5 mg total) by mouth daily.  90 tablet  1  . metFORMIN (GLUCOPHAGE) 1000 MG tablet Take 1 tablet (1,000 mg total) by mouth 2 (two) times daily with a meal.  180 tablet  1  . Multiple Vitamin (MULTIVITAMIN) tablet Take 1 tablet by mouth daily.        . Omega-3 Fatty Acids (FISH OIL) 1000 MG CAPS Take 1,000 capsules by mouth daily.        [name]  ONE TOUCH LANCETS MISC Use to check blood sugar three time per day (250.00)  100 each  3  . simvastatin (ZOCOR) 40 MG tablet Take 1 tablet (40 mg total) by mouth at bedtime.  90 tablet  1  . [DISCONTINUED] valsartan (DIOVAN) 160 MG tablet Take 1/2 tablet by mouth once daily  30 tablet  5   No current facility-administered medications on file prior to visit.    BP 130/72  Temp(Src) 98.3 F (36.8 C) (Oral)  Wt 140 lb (63.504 kg)  BMI 24.41 kg/m2    Objective:   Physical Exam  Constitutional: She is oriented to person, place, and time. She appears well-developed and well-nourished.  Cardiovascular: Normal rate, regular rhythm and normal heart sounds.   Pulmonary/Chest: Effort normal and breath sounds normal. She has no wheezes.  Abdominal: Bowel sounds are normal. There is no tenderness.  No flank tenderness to percussion  Neurological: She is alert and oriented to person, place, and time. No cranial nerve deficit.  Psychiatric: She has a normal mood and affect. Her behavior is normal.          Assessment & Plan:

## 2013-04-18 NOTE — Assessment & Plan Note (Signed)
67 year old [place] American female with frequent urinary tract infections. UA positive for blood and leukocytes. Treat with Bactrim DS twice daily for 5 days. Send urine culture. Her symptoms may be secondary to vaginal atrophy. Trial of Premarin vaginal cream 1 g once weekly. Reassess in one month.  Patient advised to call office if symptoms persist or worsen.

## 2013-04-18 NOTE — Telephone Encounter (Signed)
Patient Information:  Caller Name: Anjolie  Phone: [phone]  Patient: Cathy [name], Cathy [name]  Gender: Female  DOB: [birth year]  Age: 67 Years  PCP: Artist Pais Doe-Hyun Molly Maduro) (Adults only)  Office Follow Up:  Does the office need to follow up with this patient?: No  Instructions For The Office: N/A  RN Note:  Frequency also noted on urination.  Last UTI was in March 2014 and was on Cipro.  BS 118 on 05/24.  Is tender at times to void  Symptoms  Reason For Call & Symptoms: UTI-DM with pressure on urinating and cloudy urine.  Reviewed Health History In EMR: Yes  Reviewed Medications In EMR: Yes  Reviewed Allergies In EMR: Yes  Reviewed Surgeries / Procedures: N/A  Date of Onset of Symptoms: 04/12/2013  Treatments Tried: Cranberry juice  Treatments Tried Worked: No  Guideline(s) Used:  Urination Pain - Female  Disposition Per Guideline:   See Today in Office  Reason For Disposition Reached:   Age > 50 years  Advice Given:  Call Back If:   You become worse.  Patient Will Follow Care Advice:  YES  Appointment Scheduled:  04/18/2013 14:00:00 Appointment Scheduled Provider:  Artist Pais, Doe-Hyun Molly Maduro) (Adults only)

## 2013-04-20 LAB — URINE CULTURE: Colony Count: >=100000

## 2013-04-21 ENCOUNTER — Telehealth: Payer: Self-pay | Admitting: Internal Medicine

## 2013-04-21 NOTE — Telephone Encounter (Signed)
Pt here Tues and dx with UTI. Has medicine for that, but now says it is itching. Pt requesting something he's given her before - purple looking pill that changes the color of her urine to orange? Pt uses [facility] in [place]. Please advise.

## 2013-04-21 NOTE — Telephone Encounter (Signed)
Left message for pt to call back  °

## 2013-04-21 NOTE — Telephone Encounter (Signed)
She may have vaginal yeast infection. Does she have any vaginal discharge.  If yes, use OTC monostat.

## 2013-04-21 NOTE — Telephone Encounter (Signed)
Pt will go get some monostat

## 2013-05-16 ENCOUNTER — Telehealth: Payer: Self-pay | Admitting: Internal Medicine

## 2013-05-16 NOTE — Telephone Encounter (Signed)
Patient Information:  Caller Name: Deb  Phone: [phone]  Patient: Cathy, [name]  Gender: Female  DOB: [birth year]  Age: 67 Years  PCP: Artist Pais Doe-Hyun Molly Maduro) (Adults only)  Office Follow Up:  Does the office need to follow up with this patient?: No  Instructions For The Office: N/A   Symptoms  Reason For Call & Symptoms: Sinus drainage with productive cough with yellowish flemm; Cough increased at night; Nasal drainage that is clear; Afebrile; Asking what she can take:  Reviewed Health History In EMR: Yes  Reviewed Medications In EMR: Yes  Reviewed Allergies In EMR: Yes  Reviewed Surgeries / Procedures: Yes  Date of Onset of Symptoms: 05/12/2013  Treatments Tried: Mucinex x 1 only; Nasal Spray  Treatments Tried Worked: No  Guideline(s) Used:  Colds  Cough  Disposition Per Guideline:   See Within 3 Days in Office  Reason For Disposition Reached:   Taking an ACE Inhibitor medication (e.g., benazepril/LOTENSIN, captopril/CAPOTEN, enalapril/VASOTEC, lisinopril/ZESTRIL)  Advice Given:  N/A  Patient Will Follow Care Advice:  YES  Appointment Scheduled:  05/17/2013 08:45:57 Appointment Scheduled Provider:  Gershon Crane (Family Practice)

## 2013-05-17 ENCOUNTER — Encounter: Payer: Self-pay | Admitting: Family Medicine

## 2013-05-17 ENCOUNTER — Ambulatory Visit (INDEPENDENT_AMBULATORY_CARE_PROVIDER_SITE_OTHER): Payer: BC Managed Care – PPO | Admitting: Family Medicine

## 2013-05-17 VITALS — BP 130/74 | HR 85 | Temp 98.1°F | Wt 140.0 lb

## 2013-05-17 DIAGNOSIS — J019 Acute sinusitis, unspecified: Secondary | ICD-10-CM

## 2013-05-17 MED ORDER — FLUCONAZOLE 150 MG PO TABS: 150.0000 mg | ORAL_TABLET | Freq: Once | ORAL | Status: DC

## 2013-05-17 MED ORDER — AZITHROMYCIN 250 MG PO TABS: ORAL_TABLET | ORAL | Status: DC

## 2013-05-17 NOTE — Progress Notes (Signed)
  Subjective:    Patient ID: Cathy [name], female    DOB: Aug 01, 1946, 67 y.o.   MRN: [id number]  HPI Here for one week of sinus pressure, PND, and coughing up yellow sputum. No fever.    Review of Systems  Constitutional: Negative.   HENT: Positive for congestion, postnasal drip and sinus pressure.   Eyes: Negative.   Respiratory: Positive for cough.        Objective:   Physical Exam  Constitutional: She appears well-developed and well-nourished.  HENT:  Right Ear: External ear normal.  Left Ear: External ear normal.  Nose: Nose normal.  Mouth/Throat: Oropharynx is clear and moist.  Eyes: Conjunctivae are normal.  Pulmonary/Chest: Effort normal and breath sounds normal.  Lymphadenopathy:    She has no cervical adenopathy.          Assessment & Plan:  Add Mucinex and Delsym.

## 2013-05-24 ENCOUNTER — Telehealth: Payer: Self-pay | Admitting: Internal Medicine

## 2013-05-24 MED ORDER — ONETOUCH LANCETS MISC: Status: DC

## 2013-05-24 NOTE — Telephone Encounter (Signed)
rx sent in electronically 

## 2013-05-24 NOTE — Telephone Encounter (Signed)
Pt is calling to request a 3 month supply of her ONE TOUCH LANCETS MISC. She would like these called into the [facility] on [address]. Please assist.

## 2013-06-01 ENCOUNTER — Other Ambulatory Visit: Payer: Self-pay

## 2013-06-03 ENCOUNTER — Other Ambulatory Visit: Payer: Self-pay | Admitting: Family

## 2013-07-27 ENCOUNTER — Telehealth: Payer: Self-pay | Admitting: Internal Medicine

## 2013-07-27 DIAGNOSIS — I10 Essential (primary) hypertension: Secondary | ICD-10-CM

## 2013-07-27 DIAGNOSIS — R7309 Other abnormal glucose: Secondary | ICD-10-CM

## 2013-07-27 NOTE — Telephone Encounter (Signed)
Lab orders placed.  Okay to schedule lab appointment if patient would like to.

## 2013-07-27 NOTE — Telephone Encounter (Addendum)
Pt states she comes in for labs every 6 months and would like to know if she could come in this month for those labs? (bmet 401.9/a1c 790.29)

## 2013-07-27 NOTE — Telephone Encounter (Signed)
Ok for A1c - 790.29 and BMET 401.9

## 2013-08-01 NOTE — Telephone Encounter (Signed)
Done

## 2013-08-02 ENCOUNTER — Other Ambulatory Visit (INDEPENDENT_AMBULATORY_CARE_PROVIDER_SITE_OTHER): Payer: BC Managed Care – PPO

## 2013-08-02 DIAGNOSIS — I10 Essential (primary) hypertension: Secondary | ICD-10-CM

## 2013-08-02 DIAGNOSIS — R7309 Other abnormal glucose: Secondary | ICD-10-CM

## 2013-08-02 LAB — BASIC METABOLIC PANEL
BUN: 14 mg/dL (ref 6–23)
Calcium: 9.7 mg/dL (ref 8.4–10.5)
GFR: 106.32 mL/min (ref >60.00)
Glucose, Bld: 87 mg/dL (ref 70–99)
Sodium: 139 mEq/L (ref 135–145)

## 2013-08-03 ENCOUNTER — Telehealth: Payer: Self-pay | Admitting: Internal Medicine

## 2013-08-03 NOTE — Telephone Encounter (Signed)
Pt returned your call.  

## 2013-08-03 NOTE — Telephone Encounter (Signed)
See result note.  

## 2013-08-16 ENCOUNTER — Encounter: Payer: Self-pay | Admitting: Family Medicine

## 2013-08-16 ENCOUNTER — Ambulatory Visit (INDEPENDENT_AMBULATORY_CARE_PROVIDER_SITE_OTHER): Payer: BC Managed Care – PPO | Admitting: Family Medicine

## 2013-08-16 ENCOUNTER — Telehealth: Payer: Self-pay | Admitting: Internal Medicine

## 2013-08-16 VITALS — BP 128/76 | HR 87 | Temp 98.0°F | Wt 142.0 lb

## 2013-08-16 DIAGNOSIS — N390 Urinary tract infection, site not specified: Secondary | ICD-10-CM

## 2013-08-16 LAB — POCT URINALYSIS DIPSTICK
Ketones, UA: NEGATIVE
Protein, UA: NEGATIVE
Spec Grav, UA: 1.015
pH, UA: 7

## 2013-08-16 MED ORDER — CIPROFLOXACIN HCL 500 MG PO TABS: 500.0000 mg | ORAL_TABLET | Freq: Two times a day (BID) | ORAL | Status: DC

## 2013-08-16 NOTE — Addendum Note (Signed)
Addended by: Aniceto Boss A on: 08/16/2013 12:32 PM   Modules accepted: Orders

## 2013-08-16 NOTE — Progress Notes (Signed)
  Subjective:    Patient ID: Cathy [name], female    DOB: 09/20/1946, 67 y.o.   MRN: [id number]  HPI Here for 2 days of urinary urgency and burning. No fever.    Review of Systems  Constitutional: Negative.   Gastrointestinal: Negative.   Genitourinary: Positive for dysuria, urgency and frequency.       Objective:   Physical Exam  Constitutional: She appears well-developed and well-nourished.  Abdominal: Soft. Bowel sounds are normal. She exhibits no distension and no mass. There is no tenderness. There is no rebound and no guarding.          Assessment & Plan:  Culture the urine. Drink plenty of fluids

## 2013-08-16 NOTE — Telephone Encounter (Signed)
FYI

## 2013-08-16 NOTE — Telephone Encounter (Signed)
Patient Information:  Caller Name: Kaycie  Phone: [phone]  Patient: Cathy [name], Cathy [name]  Gender: Female  DOB: [birth year]  Age: 67 Years  PCP: Artist Pais Doe-Hyun Molly Maduro) (Adults only)  Office Follow Up:  Does the office need to follow up with this patient?: No  Instructions For The Office: N/A   Symptoms  Reason For Call & Symptoms: Urinary symptoms including frequency and mild dysuria. Denies hematuria. Mild low back pain present. Glucometer not working; not able to afford test supplies.  Reviewed Health History In EMR: Yes  Reviewed Medications In EMR: Yes  Reviewed Allergies In EMR: Yes  Reviewed Surgeries / Procedures: Yes  Date of Onset of Symptoms: 08/14/2013  Treatments Tried: crfanberry juice; took 2 left over antibiotics 08/15/13.  Treatments Tried Worked: Yes  Guideline(s) Used:  Urination Pain - Female  Disposition Per Guideline:   Go to Office Now  Reason For Disposition Reached:   Side (flank) or lower back pain present  Advice Given:  Fluids:   Drink extra fluids. Drink 8-10 glasses of liquids a day (Reason: to produce a dilute, non-irritating urine).  Cranberry Juice:   Some people think that drinking cranberry juice may help in fighting urinary tract infections. However, there is no good research that has ever proved this.  Call Back If:  You become worse.  Patient Will Follow Care Advice:  YES  Appointment Scheduled:  08/16/2013 11:00:00 Appointment Scheduled Provider:  Gershon Crane [facility])

## 2013-08-21 NOTE — Telephone Encounter (Signed)
pls call pt back on cell phone °

## 2013-08-22 NOTE — Progress Notes (Signed)
Quick Note:  I spoke with pt ______ 

## 2013-08-29 ENCOUNTER — Other Ambulatory Visit: Payer: Self-pay | Admitting: Internal Medicine

## 2013-08-29 DIAGNOSIS — Z1231 Encounter for screening mammogram for malignant neoplasm of breast: Secondary | ICD-10-CM

## 2013-09-07 ENCOUNTER — Telehealth: Payer: Self-pay | Admitting: Internal Medicine

## 2013-09-11 ENCOUNTER — Emergency Department (HOSPITAL_COMMUNITY)
Admission: EM | Admit: 2013-09-11 | Discharge: 2013-09-11 | Disposition: A | Discharge status: Home / Self Care | Payer: No Typology Code available for payment source | Attending: Emergency Medicine | Admitting: Emergency Medicine

## 2013-09-11 ENCOUNTER — Emergency Department (HOSPITAL_COMMUNITY): Payer: No Typology Code available for payment source

## 2013-09-11 ENCOUNTER — Encounter (HOSPITAL_COMMUNITY): Payer: Self-pay | Admitting: Emergency Medicine

## 2013-09-11 DIAGNOSIS — I10 Essential (primary) hypertension: Secondary | ICD-10-CM | POA: Insufficient documentation

## 2013-09-11 DIAGNOSIS — Y9289 Other specified places as the place of occurrence of the external cause: Secondary | ICD-10-CM | POA: Insufficient documentation

## 2013-09-11 DIAGNOSIS — S93402A Sprain of unspecified ligament of left ankle, initial encounter: Secondary | ICD-10-CM

## 2013-09-11 DIAGNOSIS — S8392XA Sprain of unspecified site of left knee, initial encounter: Secondary | ICD-10-CM

## 2013-09-11 DIAGNOSIS — E119 Type 2 diabetes mellitus without complications: Secondary | ICD-10-CM | POA: Insufficient documentation

## 2013-09-11 DIAGNOSIS — Y9389 Activity, other specified: Secondary | ICD-10-CM | POA: Insufficient documentation

## 2013-09-11 DIAGNOSIS — M1990 Unspecified osteoarthritis, unspecified site: Secondary | ICD-10-CM | POA: Insufficient documentation

## 2013-09-11 DIAGNOSIS — Z79899 Other long term (current) drug therapy: Secondary | ICD-10-CM | POA: Insufficient documentation

## 2013-09-11 DIAGNOSIS — Z880 Allergy status to penicillin: Secondary | ICD-10-CM | POA: Insufficient documentation

## 2013-09-11 DIAGNOSIS — IMO0002 Reserved for concepts with insufficient information to code with codable children: Secondary | ICD-10-CM | POA: Insufficient documentation

## 2013-09-11 DIAGNOSIS — W19XXXA Unspecified fall, initial encounter: Secondary | ICD-10-CM

## 2013-09-11 DIAGNOSIS — W010XXA Fall on same level from slipping, tripping and stumbling without subsequent striking against object, initial encounter: Secondary | ICD-10-CM | POA: Insufficient documentation

## 2013-09-11 DIAGNOSIS — M549 Dorsalgia, unspecified: Secondary | ICD-10-CM | POA: Insufficient documentation

## 2013-09-11 DIAGNOSIS — S93409A Sprain of unspecified ligament of unspecified ankle, initial encounter: Secondary | ICD-10-CM | POA: Insufficient documentation

## 2013-09-11 DIAGNOSIS — E785 Hyperlipidemia, unspecified: Secondary | ICD-10-CM | POA: Insufficient documentation

## 2013-09-11 DIAGNOSIS — Z7982 Long term (current) use of aspirin: Secondary | ICD-10-CM | POA: Insufficient documentation

## 2013-09-11 DIAGNOSIS — Z87891 Personal history of nicotine dependence: Secondary | ICD-10-CM | POA: Insufficient documentation

## 2013-09-11 NOTE — ED Notes (Signed)
Veatrice Bourbon Tech aware of orders and will be on unit shortly

## 2013-09-11 NOTE — ED Provider Notes (Signed)
CSN: [id number]     Arrival date & time 09/11/13  1748 History  This chart was scribed for non-physician practitioner Arthor Captain, PA-C working with Shon Baton, MD by Valera Castle, ED scribe. This patient was seen in room WTR8/WTR8 and the patient's care was started at 7:10 PM.    Chief Complaint  Patient presents with  . Ankle Pain  . Knee Pain   The history is provided by the patient. No language interpreter was used.   HPI Comments: Cathy [name] is a 67 y.o. female with a h/o DJD and low back pain who presents to the Emergency Department complaining of sudden, moderate, constant, bilateral knee and ankle pain, onset 1 week ago when she fell after getting out of her car on her way into [employer]. She states she did not fall all the way, catching herself with her arm on a door. She reports that her knees and ankles twisted when she fell. She states that her left knee and left ankle hurt more than on her right. She denies any clicking, popping, or sliding in her knees or ankles. She reports difficulty ambulating, with a pain severity of 10/10. She reports that the pain has gradually worsened over the last week. She reports taking Advil, with no relief. She denies hitting her head, LOC, and any other associated symptoms. She reports seeing an orthopedic doctor years ago but cannot remember the name.   PCP - Thomos Lemons, DO   Past Medical History  Diagnosis Date  . Diabetes mellitus type II   . Hyperlipidemia   . Hypertension   . History of depression   . DJD (degenerative joint disease)   . Low back pain   . History of MRSA infection     recurrent carbuncle   Past Surgical History  Procedure Laterality Date  . Breast biopsy    . Appendectomy    . Abdominal hysterectomy    . Tubal ligation    . Inguinal hernia repair     Family History  Problem Relation Age of Onset  . Aneurysm Mother 10    deceased secondary to brain aneurysm  . Liver cancer Father 21    deceased   . Diabetes      grandmother   History  Substance Use Topics  . Smoking status: Former Games developer  . Smokeless tobacco: Never Used     Comment: quit 10-11 years ago light smoker  . Alcohol Use: Yes     Comment: occ   OB History   Grav Para Term Preterm Abortions TAB SAB Ect Mult Living   3 2 2  1  1   2      Review of Systems  Musculoskeletal: Positive for gait problem.       Bilateral knee and ankle pain.  Pain is worst in the left knee and left ankle.  Neurological: Negative for syncope and headaches.  All other systems reviewed and are negative.    Allergies  Penicillins  Home Medications   Current Outpatient Rx  Name  Route  Sig  Dispense  Refill  . acetaminophen (TYLENOL) 325 MG tablet   Oral   Take 650 mg by mouth every 6 (six) hours as needed for pain (pain).         [name] aspirin 81 MG tablet   Oral   Take 81 mg by mouth daily.           . Calcium Carbonate-Vitamin D (CALTRATE 600+D) 600-400 MG-UNIT per  tablet   Oral   Take 1 tablet by mouth daily.           . folic acid (FOLVITE) 1 MG tablet   Oral   Take 1 mg by mouth daily.           [name] glucose blood (ONE TOUCH TEST STRIPS) test strip      Use as instructed to check blood sugar three time per day (250.00)   100 each   3   . lisinopril (PRINIVIL,ZESTRIL) 10 MG tablet   Oral   Take 0.5 tablets (5 mg total) by mouth daily.   90 tablet   1   . metFORMIN (GLUCOPHAGE) 1000 MG tablet      TAKE 1 TABLET (1,000 MG TOTAL) BY MOUTH 2 (TWO) TIMES DAILY WITH A MEAL.   60 tablet   4     CYCLE FILL MEDICATION. Authorization is required f ...   . Multiple Vitamin (MULTIVITAMIN) tablet   Oral   Take 1 tablet by mouth daily.           . Omega-3 Fatty Acids (FISH OIL) 1000 MG CAPS   Oral   Take 1,000 capsules by mouth daily.           . ONE TOUCH LANCETS MISC      Use to check blood sugar three time per day (250.00)   100 each   3   . simvastatin (ZOCOR) 40 MG tablet   Oral   Take 1  tablet (40 mg total) by mouth at bedtime.   90 tablet   1    Triage Vitals: BP 118/99  Pulse 75  Temp(Src) 98.9 F (37.2 C) (Oral)  Resp 20  SpO2 100%  Physical Exam  Nursing note and vitals reviewed. Constitutional: She is oriented to person, place, and time. She appears well-developed and well-nourished. No distress.  HENT:  Head: Normocephalic and atraumatic.  Eyes: EOM are normal.  Neck: Neck supple. No tracheal deviation present.  Cardiovascular: Normal rate and regular rhythm.  Exam reveals no gallop and no friction rub.   No murmur heard. Pulmonary/Chest: Effort normal and breath sounds normal. No respiratory distress. She has no wheezes. She has no rales.  Musculoskeletal: Normal range of motion.  Neurological: She is alert and oriented to person, place, and time.  Skin: Skin is warm and dry.  Psychiatric: She has a normal mood and affect. Her behavior is normal.    ED Course  Procedures (including critical care time)  DIAGNOSTIC STUDIES: Oxygen Saturation is 100% on room air, normal by my interpretation.    COORDINATION OF CARE: 7:15 PM - Discussed treatment plan which includes left ankle, left foot, left knee xrays with pt at bedside and pt agreed to plan.   Labs Review Labs Reviewed - No data to display Imaging Review Dg Ankle Complete Right  09/13/2013   CLINICAL DATA:  Right ankle pain  EXAM: RIGHT ANKLE - COMPLETE 3+ VIEW  COMPARISON:  None.  FINDINGS: There is no evidence of fracture, dislocation, or joint effusion. There is no evidence of arthropathy or other focal bone abnormality. Soft tissues are unremarkable.  IMPRESSION: No acute abnormality noted.   Electronically Signed   By: Alcide Clever M.D.   On: 09/13/2013 14:28    EKG Interpretation   None      Meds ordered this encounter  Medications  . acetaminophen (TYLENOL) 325 MG tablet    Sig: Take 650 mg by mouth every 6 (  six) hours as needed for pain (pain).    MDM   1. Fall, initial  encounter   2. Knee sprain and strain, left, initial encounter   3. Ankle sprain, left, initial encounter    Patient states that she does not want xray but "just wants to be checked out." I have spoken that "checking out" her leg would inculde an xray. Patient is reluctant but has agreed to at least let me image the left knee and ankle which are given her the worst pain. 8:14 PM -  Filed Vitals:   09/11/13 1824  BP: 118/99  Pulse: 75  Temp: 98.9 F (37.2 C)  TempSrc: Oral  Resp: 20  SpO2: 100%    Patient X-Rays negative for obvious fracture or dislocation. . Pt advised to follow up with orthopedics if symptoms persist for possibility of missed fracture diagnosis. Patient given brace while in ED, conservative therapy recommended and discussed. Patient will be dc home & is agreeable with above plan.   I personally performed the services described in this documentation, which was scribed in my presence. The recorded information has been reviewed and is accurate.      Arthor Captain, PA-C 09/15/13 1151

## 2013-09-11 NOTE — ED Notes (Signed)
Assumed care of patient Patient returned from radiology--ambulates without difficulty or assistance Patient in NAD

## 2013-09-11 NOTE — ED Notes (Signed)
Pt c/o of bilateral knee pain down into foot. Pain 10/10 when walking. Left side more painful than other. States that she slipped in [employer] on a wet floor and almost fell.

## 2013-09-13 ENCOUNTER — Encounter (HOSPITAL_COMMUNITY): Payer: Self-pay | Admitting: Emergency Medicine

## 2013-09-13 ENCOUNTER — Emergency Department (HOSPITAL_COMMUNITY): Payer: No Typology Code available for payment source

## 2013-09-13 ENCOUNTER — Emergency Department (HOSPITAL_COMMUNITY)
Admission: EM | Admit: 2013-09-13 | Discharge: 2013-09-13 | Disposition: A | Discharge status: Home / Self Care | Payer: No Typology Code available for payment source | Attending: Emergency Medicine | Admitting: Emergency Medicine

## 2013-09-13 DIAGNOSIS — I10 Essential (primary) hypertension: Secondary | ICD-10-CM | POA: Insufficient documentation

## 2013-09-13 DIAGNOSIS — Z87891 Personal history of nicotine dependence: Secondary | ICD-10-CM | POA: Insufficient documentation

## 2013-09-13 DIAGNOSIS — Z79899 Other long term (current) drug therapy: Secondary | ICD-10-CM | POA: Insufficient documentation

## 2013-09-13 DIAGNOSIS — Z7982 Long term (current) use of aspirin: Secondary | ICD-10-CM | POA: Insufficient documentation

## 2013-09-13 DIAGNOSIS — Z8659 Personal history of other mental and behavioral disorders: Secondary | ICD-10-CM | POA: Insufficient documentation

## 2013-09-13 DIAGNOSIS — S93409A Sprain of unspecified ligament of unspecified ankle, initial encounter: Secondary | ICD-10-CM | POA: Insufficient documentation

## 2013-09-13 DIAGNOSIS — E785 Hyperlipidemia, unspecified: Secondary | ICD-10-CM | POA: Insufficient documentation

## 2013-09-13 DIAGNOSIS — E119 Type 2 diabetes mellitus without complications: Secondary | ICD-10-CM | POA: Insufficient documentation

## 2013-09-13 DIAGNOSIS — Z880 Allergy status to penicillin: Secondary | ICD-10-CM | POA: Insufficient documentation

## 2013-09-13 DIAGNOSIS — X500XXA Overexertion from strenuous movement or load, initial encounter: Secondary | ICD-10-CM | POA: Insufficient documentation

## 2013-09-13 DIAGNOSIS — Y9389 Activity, other specified: Secondary | ICD-10-CM | POA: Insufficient documentation

## 2013-09-13 DIAGNOSIS — Y9289 Other specified places as the place of occurrence of the external cause: Secondary | ICD-10-CM | POA: Insufficient documentation

## 2013-09-13 DIAGNOSIS — S93401A Sprain of unspecified ligament of right ankle, initial encounter: Secondary | ICD-10-CM

## 2013-09-13 DIAGNOSIS — Z8739 Personal history of other diseases of the musculoskeletal system and connective tissue: Secondary | ICD-10-CM | POA: Insufficient documentation

## 2013-09-13 DIAGNOSIS — Z8614 Personal history of Methicillin resistant Staphylococcus aureus infection: Secondary | ICD-10-CM | POA: Insufficient documentation

## 2013-09-13 LAB — GLUCOSE, CAPILLARY: Glucose-Capillary: 92 mg/dL (ref 70–99)

## 2013-09-13 MED ORDER — HYDROCODONE-ACETAMINOPHEN 5-325 MG PO TABS: 1.0000 | ORAL_TABLET | Freq: Four times a day (QID) | ORAL | Status: DC | PRN

## 2013-09-13 NOTE — ED Notes (Signed)
Patient twisted both legs on Monday. Patient was seen here on Monday for left foot pain and was diagnosed with sprain. Patient today c/o pain to right foot. No obvious deformity noted, good PMS.

## 2013-09-13 NOTE — ED Provider Notes (Signed)
CSN: [id number]     Arrival date & time 09/13/13  1145 History  This chart was scribed for non-physician practitioner Charlestine Night, PA-C, working with Layla Maw Ward, DO by Dorothey Baseman, ED Scribe. This patient was seen in room WTR8/WTR8 and the patient's care was started at 12:28 PM.    Chief Complaint  Patient presents with  . Foot Pain   The history is provided by the patient. No language interpreter was used.   HPI Comments: Cathy [name] is a 67 y.o. female who presents to the Emergency Department complaining of a constant, dull pain to the right foot onset yesterday secondary to twisting both of her legs 2 days ago while stepping out of a vehicle. She states that the pain radiates into the right knee. Patient was seen here Monday (2 days ago) for similar complaints to the left leg and was diagnosed with a sprain. She states that the pain in her right foot did not present until afterwards. Patient is not currently wearing a brace or other immobilizer on either leg. Patient reports a history of degenerative joint disease.  Past Medical History  Diagnosis Date  . Diabetes mellitus type II   . Hyperlipidemia   . Hypertension   . History of depression   . DJD (degenerative joint disease)   . Low back pain   . History of MRSA infection     recurrent carbuncle   Past Surgical History  Procedure Laterality Date  . Breast biopsy    . Appendectomy    . Abdominal hysterectomy    . Tubal ligation    . Inguinal hernia repair     Family History  Problem Relation Age of Onset  . Aneurysm Mother 76    deceased secondary to brain aneurysm  . Liver cancer Father 81    deceased  . Diabetes      grandmother   History  Substance Use Topics  . Smoking status: Former Games developer  . Smokeless tobacco: Never Used     Comment: quit 10-11 years ago light smoker  . Alcohol Use: Yes     Comment: occ   OB History   Grav Para Term Preterm Abortions TAB SAB Ect Mult Living   3 2 2  1  1    2      Review of Systems  A complete 10 system review of systems was obtained and all systems are negative except as noted in the HPI and PMH.   Allergies  Penicillins  Home Medications   Current Outpatient Rx  Name  Route  Sig  Dispense  Refill  . acetaminophen (TYLENOL) 325 MG tablet   Oral   Take 650 mg by mouth every 6 (six) hours as needed for pain (pain).         [name] aspirin 81 MG tablet   Oral   Take 81 mg by mouth daily.           . Calcium Carbonate-Vitamin D (CALTRATE 600+D) 600-400 MG-UNIT per tablet   Oral   Take 1 tablet by mouth daily.           . folic acid (FOLVITE) 1 MG tablet   Oral   Take 1 mg by mouth daily.           [name] glucose blood (ONE TOUCH TEST STRIPS) test strip      Use as instructed to check blood sugar three time per day (250.00)   100 each   3   .  lisinopril (PRINIVIL,ZESTRIL) 10 MG tablet   Oral   Take 0.5 tablets (5 mg total) by mouth daily.   90 tablet   1   . metFORMIN (GLUCOPHAGE) 1000 MG tablet      TAKE 1 TABLET (1,000 MG TOTAL) BY MOUTH 2 (TWO) TIMES DAILY WITH A MEAL.   60 tablet   4     CYCLE FILL MEDICATION. Authorization is required f ...   . Multiple Vitamin (MULTIVITAMIN) tablet   Oral   Take 1 tablet by mouth daily.           . Omega-3 Fatty Acids (FISH OIL) 1000 MG CAPS   Oral   Take 1,000 capsules by mouth daily.           . ONE TOUCH LANCETS MISC      Use to check blood sugar three time per day (250.00)   100 each   3   . simvastatin (ZOCOR) 40 MG tablet   Oral   Take 1 tablet (40 mg total) by mouth at bedtime.   90 tablet   1    Triage Vitals: BP 162/103  Pulse 93  Temp(Src) 98 F (36.7 C) (Oral)  Resp 18  SpO2 98%  Physical Exam  Nursing note and vitals reviewed. Constitutional: She is oriented to person, place, and time. She appears well-developed and well-nourished. No distress.  HENT:  Head: Normocephalic and atraumatic.  Eyes: Conjunctivae are normal.  Neck: Normal range  of motion. Neck supple.  Pulmonary/Chest: Effort normal. No respiratory distress.  Abdominal: She exhibits no distension.  Musculoskeletal: Normal range of motion.  Diffuse tenderness to palpation to the right malleolus.   Neurological: She is alert and oriented to person, place, and time.  Skin: Skin is warm and dry.  Psychiatric: She has a normal mood and affect. Her behavior is normal.    ED Course  Procedures (including critical care time)  DIAGNOSTIC STUDIES: Oxygen Saturation is 98% on room air, normal by my interpretation.    COORDINATION OF CARE: 12:32 PM- Will order an x-ray of the right foot. Discussed treatment plan with patient at bedside and patient verbalized agreement.   Imaging Review Dg Ankle Complete Right  09/13/2013   CLINICAL DATA:  Right ankle pain  EXAM: RIGHT ANKLE - COMPLETE 3+ VIEW  COMPARISON:  None.  FINDINGS: There is no evidence of fracture, dislocation, or joint effusion. There is no evidence of arthropathy or other focal bone abnormality. Soft tissues are unremarkable.  IMPRESSION: No acute abnormality noted.   Electronically Signed   By: Alcide Clever M.D.   On: 09/13/2013 14:28     I personally performed the services described in this documentation, which was scribed in my presence. The recorded information has been reviewed and is accurate.    Carlyle Dolly, PA-C 09/21/13 1515

## 2013-09-15 NOTE — ED Provider Notes (Signed)
Medical screening examination/treatment/procedure(s) were performed by non-physician practitioner and as supervising physician I was immediately available for consultation/collaboration.  EKG Interpretation   None        Ailish Prospero F Luman Holway, MD 09/15/13 1842 

## 2013-09-21 NOTE — ED Provider Notes (Signed)
Medical screening examination/treatment/procedure(s) were performed by non-physician practitioner and as supervising physician I was immediately available for consultation/collaboration.  EKG Interpretation   None         Kristen N Ward, DO 09/21/13 1526 

## 2013-09-25 ENCOUNTER — Telehealth: Payer: Self-pay | Admitting: Internal Medicine

## 2013-09-25 ENCOUNTER — Other Ambulatory Visit: Payer: Self-pay | Admitting: *Deleted

## 2013-09-25 DIAGNOSIS — E119 Type 2 diabetes mellitus without complications: Secondary | ICD-10-CM

## 2013-09-25 MED ORDER — GLUCOSE BLOOD VI STRP: ORAL_STRIP | Status: DC

## 2013-09-25 NOTE — Telephone Encounter (Signed)
Done

## 2013-09-25 NOTE — Telephone Encounter (Addendum)
Pt needs test strips for one touch  Sent to [facility]

## 2013-09-26 ENCOUNTER — Other Ambulatory Visit: Payer: Self-pay | Admitting: *Deleted

## 2013-09-26 MED ORDER — GLUCOSE BLOOD VI STRP: ORAL_STRIP | Status: DC

## 2013-09-26 NOTE — Telephone Encounter (Signed)
Pt states she needs test strips for one touch verio iq. Pt states the one we sent were the wrong ones. [facility]

## 2013-09-26 NOTE — Telephone Encounter (Signed)
New rx sent

## 2013-10-02 ENCOUNTER — Telehealth: Payer: Self-pay | Admitting: Internal Medicine

## 2013-10-02 DIAGNOSIS — E119 Type 2 diabetes mellitus without complications: Secondary | ICD-10-CM

## 2013-10-02 MED ORDER — GLUCOSE BLOOD VI STRP: ORAL_STRIP | Status: DC

## 2013-10-02 NOTE — Telephone Encounter (Signed)
Pt called, she needs strips for her One Touch verio-iq, [facility] on [address] has the strips she needs. Please advise.

## 2013-10-02 NOTE — Telephone Encounter (Signed)
rx sent in electronically 

## 2013-10-13 ENCOUNTER — Other Ambulatory Visit: Payer: Self-pay | Admitting: Family

## 2013-10-16 ENCOUNTER — Other Ambulatory Visit: Payer: Self-pay | Admitting: Internal Medicine

## 2013-10-18 ENCOUNTER — Encounter: Payer: Self-pay | Admitting: Internal Medicine

## 2013-10-18 ENCOUNTER — Ambulatory Visit (HOSPITAL_COMMUNITY): Payer: BC Managed Care – PPO

## 2013-10-18 ENCOUNTER — Ambulatory Visit (INDEPENDENT_AMBULATORY_CARE_PROVIDER_SITE_OTHER): Payer: Medicare Other | Admitting: Internal Medicine

## 2013-10-18 VITALS — BP 122/64 | HR 76 | Temp 98.1°F | Ht 63.5 in | Wt 138.0 lb

## 2013-10-18 DIAGNOSIS — E119 Type 2 diabetes mellitus without complications: Secondary | ICD-10-CM

## 2013-10-18 DIAGNOSIS — I10 Essential (primary) hypertension: Secondary | ICD-10-CM

## 2013-10-18 DIAGNOSIS — E785 Hyperlipidemia, unspecified: Secondary | ICD-10-CM

## 2013-10-18 DIAGNOSIS — Z23 Encounter for immunization: Secondary | ICD-10-CM

## 2013-10-18 MED ORDER — SIMVASTATIN 40 MG PO TABS: 40.0000 mg | ORAL_TABLET | Freq: Every day | ORAL | Status: DC

## 2013-10-18 MED ORDER — LISINOPRIL 10 MG PO TABS: 5.0000 mg | ORAL_TABLET | Freq: Every day | ORAL | Status: DC

## 2013-10-18 MED ORDER — METFORMIN HCL 1000 MG PO TABS: 1000.0000 mg | ORAL_TABLET | Freq: Two times a day (BID) | ORAL | Status: DC

## 2013-10-18 NOTE — Patient Instructions (Signed)
Please complete the following lab tests within one month (end of December 2015) BMET, A1c - 250.00 FLP, LFTs, TSH - 272.4

## 2013-10-18 NOTE — Assessment & Plan Note (Signed)
Patient tolerating simvastatin. Monitor LFTs and fasting lipid panel.

## 2013-10-18 NOTE — Assessment & Plan Note (Signed)
A1c is stable. Continue metformin 1000 mg twice a day. Monitor A1c. Patient updated with influenza vaccine. Refer for diabetic eye exam.

## 2013-10-18 NOTE — Assessment & Plan Note (Signed)
Blood pressure is well controlled. Continue lisinopril 5 mg once daily. BP: 122/64 mmHg

## 2013-10-18 NOTE — Progress Notes (Signed)
Subjective:    Patient ID: Cathy [name], female    DOB: 1945/12/13, 67 y.o.   MRN: [id number]  HPI  67 year old African American female with history of type 2 diabetes, hypertension and dyslipidemia for routine followup. Patient denies any significant interval medical history. She has been taking metformin a regular basis. She also notes good compliance with her antihypertensives and cholesterol medication.  She denies any side effects from her medication.  She is due for diabetic eye exam.   Review of Systems Negative for chest pain or shortness of breath. Negative for numbness or tingling in her hands or feet.    Past Medical History  Diagnosis Date  . Diabetes mellitus type II   . Hyperlipidemia   . Hypertension   . History of depression   . DJD (degenerative joint disease)   . Low back pain   . History of MRSA infection     recurrent carbuncle    History   Social History  . Marital Status: Single    Spouse Name: N/A    Number of Children: N/A  . Years of Education: N/A   Occupational History  . Not on file.   Social History Main Topics  . Smoking status: Former Games developer  . Smokeless tobacco: Never Used     Comment: quit 10-11 years ago light smoker  . Alcohol Use: Yes     Comment: occ  . Drug Use: No  . Sexual Activity: Not Currently    Birth Control/ Protection: Surgical   Other Topics Concern  . Not on file   Social History Narrative   Single   Former Smoker  -  quit 10 to 11 years ago (light smoker)   Alcohol use-yes     2 children    Occupation: [employer]     Past Surgical History  Procedure Laterality Date  . Breast biopsy    . Appendectomy    . Abdominal hysterectomy    . Tubal ligation    . Inguinal hernia repair      Family History  Problem Relation Age of Onset  . Aneurysm Mother 7    deceased secondary to brain aneurysm  . Liver cancer Father 58    deceased  . Diabetes      grandmother    Allergies    Allergen Reactions  . Penicillins     REACTION: urticaria (hives)    Current Outpatient Prescriptions on File Prior to Visit  Medication Sig Dispense Refill  . acetaminophen (TYLENOL) 325 MG tablet Take 650 mg by mouth every 6 (six) hours as needed for pain (pain).      [name] aspirin 81 MG tablet Take 81 mg by mouth daily.        . Calcium Carbonate-Vitamin D (CALTRATE 600+D) 600-400 MG-UNIT per tablet Take 1 tablet by mouth daily.        . folic acid (FOLVITE) 1 MG tablet Take 1 mg by mouth daily.        [name] glucose blood (ONETOUCH VERIO) test strip Use as instructed  100 each  12  . HYDROcodone-acetaminophen (NORCO/VICODIN) 5-325 MG per tablet Take 1 tablet by mouth every 6 (six) hours as needed for pain.  15 tablet  0  . lisinopril (PRINIVIL,ZESTRIL) 10 MG tablet TAKE 1/2 TABLET (5 MG) BY MOUTH DAILY  30 tablet  0  . metFORMIN (GLUCOPHAGE) 1000 MG tablet TAKE 1 TABLET (1,000 MG TOTAL) BY MOUTH 2 (TWO) TIMES DAILY WITH  A MEAL.  60 tablet  4  . Multiple Vitamin (MULTIVITAMIN) tablet Take 1 tablet by mouth daily.        . Omega-3 Fatty Acids (FISH OIL) 1000 MG CAPS Take 1,000 capsules by mouth daily.        . ONE TOUCH LANCETS MISC Use to check blood sugar three time per day (250.00)  100 each  3  . simvastatin (ZOCOR) 40 MG tablet Take 1 tablet (40 mg total) by mouth at bedtime.  90 tablet  1  . [DISCONTINUED] valsartan (DIOVAN) 160 MG tablet Take 1/2 tablet by mouth once daily  30 tablet  5   No current facility-administered medications on file prior to visit.    BP 122/64  Pulse 76  Temp(Src) 98.1 F (36.7 C) (Oral)  Ht 5' 3.5" (1.613 m)  Wt 138 lb (62.596 kg)  BMI 24.06 kg/m2    Objective:   Physical Exam  Constitutional: She is oriented to person, place, and time. She appears well-developed and well-nourished. No distress.  HENT:  Head: Normocephalic and atraumatic.  Mouth/Throat: Oropharynx is clear and moist.  Neck: Neck supple.  No carotid bruit  Cardiovascular: Normal  rate, regular rhythm and normal heart sounds.   No murmur heard. Pulmonary/Chest: Effort normal and breath sounds normal. She has no wheezes.  Abdominal: Soft. Bowel sounds are normal. There is no tenderness.  Musculoskeletal: She exhibits no edema.  Neurological: She is alert and oriented to person, place, and time. No cranial nerve deficit.  Skin: Skin is warm and dry.  See diabetic foot exam  Psychiatric: She has a normal mood and affect. Her behavior is normal.          Assessment & Plan:

## 2013-10-25 ENCOUNTER — Telehealth: Payer: Self-pay | Admitting: Internal Medicine

## 2013-10-25 MED ORDER — ONETOUCH LANCETS MISC: Status: DC

## 2013-10-25 NOTE — Telephone Encounter (Signed)
rx sent in electronically 

## 2013-10-25 NOTE — Telephone Encounter (Signed)
Pt went to pick up her test strips and realized she needs lancets too.  Please call in a rx for [facility] lancets [facility]. Thank you.

## 2013-10-26 ENCOUNTER — Other Ambulatory Visit: Payer: Self-pay | Admitting: *Deleted

## 2013-10-26 MED ORDER — ONETOUCH LANCETS MISC: Status: DC

## 2013-11-01 ENCOUNTER — Ambulatory Visit (HOSPITAL_COMMUNITY)
Admission: RE | Admit: 2013-11-01 | Discharge: 2013-11-01 | Disposition: A | Discharge status: Home / Self Care | Payer: Medicare Other | Source: Ambulatory Visit | Attending: Internal Medicine | Admitting: Internal Medicine

## 2013-11-01 DIAGNOSIS — Z1231 Encounter for screening mammogram for malignant neoplasm of breast: Secondary | ICD-10-CM | POA: Insufficient documentation

## 2013-11-21 ENCOUNTER — Other Ambulatory Visit (INDEPENDENT_AMBULATORY_CARE_PROVIDER_SITE_OTHER): Payer: Medicare Other

## 2013-11-21 DIAGNOSIS — E1059 Type 1 diabetes mellitus with other circulatory complications: Secondary | ICD-10-CM

## 2013-11-21 DIAGNOSIS — E785 Hyperlipidemia, unspecified: Secondary | ICD-10-CM

## 2013-11-21 LAB — HEPATIC FUNCTION PANEL
AST: 21 U/L (ref 0–37)
Alkaline Phosphatase: 44 U/L (ref 39–117)
Bilirubin, Direct: 0 mg/dL (ref 0.0–0.3)
Total Bilirubin: 0.7 mg/dL (ref 0.3–1.2)
Total Protein: 6.7 g/dL (ref 6.0–8.3)

## 2013-11-21 LAB — LDL CHOLESTEROL, DIRECT: Direct LDL: 118.2 mg/dL

## 2013-11-21 LAB — HEMOGLOBIN A1C: Hgb A1c MFr Bld: 6.3 % (ref 4.6–6.5)

## 2013-11-21 LAB — BASIC METABOLIC PANEL WITH GFR
BUN: 12 mg/dL (ref 6–23)
CO2: 29 meq/L (ref 19–32)
Calcium: 9.8 mg/dL (ref 8.4–10.5)
Chloride: 104 meq/L (ref 96–112)
Creatinine, Ser: 0.8 mg/dL (ref 0.4–1.2)
GFR: 95.3 mL/min
Glucose, Bld: 88 mg/dL (ref 70–99)
Potassium: 4 meq/L (ref 3.5–5.1)
Sodium: 138 meq/L (ref 135–145)

## 2013-11-21 LAB — LIPID PANEL
HDL: 72.2 mg/dL (ref >39.00)
Total CHOL/HDL Ratio: 3
VLDL: 12.4 mg/dL (ref 0.0–40.0)

## 2013-12-07 ENCOUNTER — Other Ambulatory Visit: Payer: Self-pay | Admitting: Internal Medicine

## 2013-12-07 MED ORDER — SIMVASTATIN 40 MG PO TABS: 40.0000 mg | ORAL_TABLET | Freq: Every day | ORAL | Status: DC

## 2013-12-12 ENCOUNTER — Other Ambulatory Visit: Payer: Self-pay | Admitting: *Deleted

## 2013-12-12 ENCOUNTER — Telehealth: Payer: Self-pay | Admitting: Internal Medicine

## 2013-12-12 MED ORDER — SIMVASTATIN 40 MG PO TABS: 40.0000 mg | ORAL_TABLET | Freq: Every day | ORAL | Status: DC

## 2013-12-12 MED ORDER — LISINOPRIL 10 MG PO TABS: 5.0000 mg | ORAL_TABLET | Freq: Every day | ORAL | Status: DC

## 2013-12-12 NOTE — Telephone Encounter (Signed)
Pt is requesting a refill of her lisinopril (PRINIVIL,ZESTRIL) 10 MG tablet and please call pt back about her lab results from 1/15.  Thanks

## 2013-12-12 NOTE — Telephone Encounter (Signed)
rx sent in electronically 

## 2013-12-19 ENCOUNTER — Telehealth: Payer: Self-pay | Admitting: Internal Medicine

## 2013-12-19 NOTE — Telephone Encounter (Signed)
This patient is coming to see Dr. Maudie Mercury on 12/20/13

## 2013-12-19 NOTE — Telephone Encounter (Signed)
Noted  

## 2013-12-19 NOTE — Telephone Encounter (Signed)
Patient Information:  Caller Name: Lauretta  Phone: [phone]  Patient: Cathy [name], Cathy [name]  Gender: Female  DOB: 1946-09-27  Age: 68 Years  PCP: Shawna Orleans [facility] [place]) (Adults only)  Office Follow Up:  Does the office need to follow up with this patient?: No  Instructions For The Office: N/A   Symptoms  Reason For Call & Symptoms: Pain in lower back and goes up the right side under breast bone, feels tight;  Pain comes and goes, bending makes it a little worse;  Pain feels better after passing gas, took some Mag Citrate with results today;  Pain was  a little better for a while but is coming back.  Reviewed Health History In EMR: Yes  Reviewed Medications In EMR: Yes  Reviewed Allergies In EMR: Yes  Reviewed Surgeries / Procedures: Yes  Date of Onset of Symptoms: 12/05/2013  Guideline(s) Used:  Back Pain  Disposition Per Guideline:   See Today or Tomorrow in Office  Reason For Disposition Reached:   Age > 11 and no history of prior similar back pain  Advice Given:  Cold or Heat:  Cold Pack: For pain or swelling, use a cold pack or ice wrapped in a wet cloth. Put it on the sore area for 20 minutes. Repeat 4 times on the first day, then as needed.  Heat Pack: If pain lasts over 2 days, apply heat to the sore area. Use a heat pack, heating pad, or warm wet washcloth. Do this for 10 minutes, then as needed. For widespread stiffness, take a hot bath or hot shower instead. Move the sore area under the warm water.  Sleep:  Sleep on your side with a pillow between your knees. If you sleep on your back, put a pillow under your knees.  Avoid sleeping on your stomach.  Your mattress should be firm. Avoid waterbeds.  Activity  Keep doing your day-to-day activities if it is not too painful. Staying active is better than resting.  Avoid anything that makes your pain worse. Avoid heavy lifting, twisting, and too much exercise until your back heals.  Pain Medicines:  For pain relief, take  acetaminophen, ibuprofen, or naproxen.  Acetaminophen (e.g., Tylenol):  Take 650 mg (two 325 mg pills) by mouth every 4-6 hours as needed. Each Regular Strength Tylenol pill has 325 mg of acetaminophen. The most you should take each day is 3,250 mg (10 Regular Strength pills a day).  Another choice is to take 1,000 mg every 8 hours as needed. Each Extra Strength Tylenol pill has 500 mg of acetaminophen. The most you should take each day is 3,000 mg (6 Extra Strength pills a day).  Call Back If:  Numbness or weakness occur  Bowel/bladder problems occur  Pain lasts for more than 2 weeks  You become worse.  Patient Will Follow Care Advice:  YES  Appointment Scheduled:  12/20/2013 08:45:00 Appointment Scheduled Provider:  Maudie Mercury (TEXT 1st, after 20 mins can call), Jarrett Soho [facility])

## 2013-12-20 ENCOUNTER — Ambulatory Visit (INDEPENDENT_AMBULATORY_CARE_PROVIDER_SITE_OTHER): Payer: Medicare Other | Admitting: Family Medicine

## 2013-12-20 ENCOUNTER — Encounter: Payer: Self-pay | Admitting: Family Medicine

## 2013-12-20 VITALS — BP 120/86 | Temp 98.2°F | Wt 142.0 lb

## 2013-12-20 DIAGNOSIS — R350 Frequency of micturition: Secondary | ICD-10-CM

## 2013-12-20 LAB — POCT URINALYSIS DIPSTICK
Bilirubin, UA: NEGATIVE
Blood, UA: NEGATIVE
GLUCOSE UA: NEGATIVE
KETONES UA: NEGATIVE
Nitrite, UA: NEGATIVE
Protein, UA: NEGATIVE
Spec Grav, UA: 1.01
UROBILINOGEN UA: 0.2
pH, UA: 7

## 2013-12-20 MED ORDER — CIPROFLOXACIN HCL 250 MG PO TABS: 250.0000 mg | ORAL_TABLET | Freq: Two times a day (BID) | ORAL | Status: DC

## 2013-12-20 NOTE — Progress Notes (Signed)
Chief Complaint  Patient presents with  . Urinary Frequency    HPI:  Cathy [name] is a 68 yo pt of Dr. Shawna Orleans  here for an acute visit for:  Urinary Frequency: -hx of recurrent UTIs -symptoms started 1-2 weeks ago -urinary frequency, cloudy urine, bladder pressure, some low back pain but this is chronic and reports was told is arthritis -denies: fevers, flank pain, vomiting, nausea, hematuria -reports treated her chronic constipation and drank some water and felt better but wants to check for uti  ROS: See pertinent positives and negatives per HPI.  Past Medical History  Diagnosis Date  . Diabetes mellitus type II   . Hyperlipidemia   . Hypertension   . History of depression   . DJD (degenerative joint disease)   . Low back pain   . History of MRSA infection     recurrent carbuncle    Past Surgical History  Procedure Laterality Date  . Breast biopsy    . Appendectomy    . Abdominal hysterectomy    . Tubal ligation    . Inguinal hernia repair      Family History  Problem Relation Age of Onset  . Aneurysm Mother 8    deceased secondary to brain aneurysm  . Liver cancer Father 29    deceased  . Diabetes      grandmother    History   Social History  . Marital Status: Single    Spouse Name: N/A    Number of Children: N/A  . Years of Education: N/A   Social History Main Topics  . Smoking status: Former Research scientist (life sciences)  . Smokeless tobacco: Never Used     Comment: quit 10-11 years ago light smoker  . Alcohol Use: Yes     Comment: occ  . Drug Use: No  . Sexual Activity: Not Currently    Birth Control/ Protection: Surgical   Other Topics Concern  . None   Social History Narrative   Single   Former Smoker  -  quit 10 to 11 years ago (light smoker)   Alcohol use-yes     2 children    Occupation: [employer]     Current outpatient prescriptions:acetaminophen (TYLENOL) 325 MG tablet, Take 650 mg by mouth every 6 (six) hours as needed for pain  (pain)., Disp: , Rfl: ;  aspirin 81 MG tablet, Take 81 mg by mouth daily.  , Disp: , Rfl: ;  Calcium Carbonate-Vitamin D (CALTRATE 600+D) 600-400 MG-UNIT per tablet, Take 1 tablet by mouth daily.  , Disp: , Rfl: ;  folic acid (FOLVITE) 1 MG tablet, Take 1 mg by mouth daily.  , Disp: , Rfl:  glucose blood (ONETOUCH VERIO) test strip, Use as instructed, Disp: 100 each, Rfl: 12;  HYDROcodone-acetaminophen (NORCO/VICODIN) 5-325 MG per tablet, Take 1 tablet by mouth every 6 (six) hours as needed for pain., Disp: 15 tablet, Rfl: 0;  lisinopril (PRINIVIL,ZESTRIL) 10 MG tablet, Take 0.5 tablets (5 mg total) by mouth daily., Disp: 45 tablet, Rfl: 1 metFORMIN (GLUCOPHAGE) 1000 MG tablet, Take 1 tablet (1,000 mg total) by mouth 2 (two) times daily with a meal., Disp: 180 tablet, Rfl: 1;  Multiple Vitamin (MULTIVITAMIN) tablet, Take 1 tablet by mouth daily.  , Disp: , Rfl: ;  Omega-3 Fatty Acids (FISH OIL) 1000 MG CAPS, Take 1,000 capsules by mouth daily.  , Disp: , Rfl: ;  ONE TOUCH LANCETS MISC, Use to check blood sugar three time per day (250.00),  Disp: 100 each, Rfl: 3 simvastatin (ZOCOR) 40 MG tablet, Take 1 tablet (40 mg total) by mouth daily at 6 PM., Disp: 90 tablet, Rfl: 1;  ciprofloxacin (CIPRO) 250 MG tablet, Take 1 tablet (250 mg total) by mouth 2 (two) times daily., Disp: 6 tablet, Rfl: 0;  [DISCONTINUED] valsartan (DIOVAN) 160 MG tablet, Take 1/2 tablet by mouth once daily, Disp: 30 tablet, Rfl: 5  EXAM:  Filed Vitals:   12/20/13 0841  BP: 120/86  Temp: 98.2 F (36.8 C)    Body mass index is 24.76 kg/(m^2).  GENERAL: vitals reviewed and listed above, alert, oriented, appears well hydrated and in no acute distress  HEENT: atraumatic, conjunttiva clear, no obvious abnormalities on inspection of external nose and ears  NECK: no obvious masses on inspection  LUNGS: clear to auscultation bilaterally, no wheezes, rales or rhonchi, good air movement  CV: HRRR, no peripheral edema  ABD: soft,  NTTP  MS: moves all extremities without noticeable abnormality  PSYCH: pleasant and cooperative, no obvious depression or anxiety  ASSESSMENT AND PLAN:  Discussed the following assessment and plan:  Urinary frequency - Plan: POCT urinalysis dipstick, Culture, Urine, ciprofloxacin (CIPRO) 250 MG tablet  -leuks on udip and she prefers to do empiric tx for possible UTI after discussion risks/benefits -Patient advised to return or notify a doctor immediately if symptoms worsen or persist or new concerns arise.  There are no Patient Instructions on file for this visit.   Colin Benton R.

## 2013-12-20 NOTE — Progress Notes (Signed)
Pre visit review using our clinic review tool, if applicable. No additional management support is needed unless otherwise documented below in the visit note. 

## 2013-12-22 LAB — URINE CULTURE
Colony Count: NO GROWTH
Organism ID, Bacteria: NO GROWTH

## 2014-03-02 ENCOUNTER — Encounter: Payer: Self-pay | Admitting: Family

## 2014-03-02 ENCOUNTER — Ambulatory Visit (INDEPENDENT_AMBULATORY_CARE_PROVIDER_SITE_OTHER): Payer: Medicare Other | Admitting: Family

## 2014-03-02 ENCOUNTER — Telehealth: Payer: Self-pay | Admitting: Internal Medicine

## 2014-03-02 VITALS — BP 108/70 | HR 89 | Temp 98.0°F | Wt 137.0 lb

## 2014-03-02 DIAGNOSIS — B9689 Other specified bacterial agents as the cause of diseases classified elsewhere: Secondary | ICD-10-CM

## 2014-03-02 DIAGNOSIS — J309 Allergic rhinitis, unspecified: Secondary | ICD-10-CM

## 2014-03-02 DIAGNOSIS — J019 Acute sinusitis, unspecified: Secondary | ICD-10-CM

## 2014-03-02 MED ORDER — AZITHROMYCIN 250 MG PO TABS: ORAL_TABLET | ORAL | Status: DC

## 2014-03-02 MED ORDER — DOXYCYCLINE HYCLATE 100 MG PO TABS: 100.0000 mg | ORAL_TABLET | Freq: Two times a day (BID) | ORAL | Status: DC

## 2014-03-02 MED ORDER — FLUTICASONE PROPIONATE 50 MCG/ACT NA SUSP: 2.0000 | Freq: Every day | NASAL | Status: DC

## 2014-03-02 NOTE — Patient Instructions (Signed)

## 2014-03-02 NOTE — Progress Notes (Signed)
Subjective:    Patient ID: Cathy [name], female    DOB: [birth year], 68 y.o.   MRN: [id number]  HPI  68 year old AA female, nonsmoker presenting today with productive cough, nasal congestion, sneezing, ear pain, headache, and sinus pressure.  Symptoms present x 2 weeks.  She has taken [place], claritin, and robitussin with no relief.      Review of Systems  Constitutional: Negative.  Negative for fever and chills.  HENT: Positive for congestion, ear pain, postnasal drip and sinus pressure.        Nasal congestion, ear pain, postnasal drip and sinus pressure  Respiratory: Positive for cough. Negative for shortness of breath and wheezing.        Cough  Cardiovascular: Negative.   Musculoskeletal: Negative.   Neurological: Positive for headaches. Negative for dizziness and weakness.       Headache   Past Medical History  Diagnosis Date  . Diabetes mellitus type II   . Hyperlipidemia   . Hypertension   . History of depression   . DJD (degenerative joint disease)   . Low back pain   . History of MRSA infection     recurrent carbuncle    History   Social History  . Marital Status: Single    Spouse Name: N/A    Number of Children: N/A  . Years of Education: N/A   Occupational History  . Not on file.   Social History Main Topics  . Smoking status: Former Research scientist (life sciences)  . Smokeless tobacco: Never Used     Comment: quit 10-11 years ago light smoker  . Alcohol Use: Yes     Comment: occ  . Drug Use: No  . Sexual Activity: Not Currently    Birth Control/ Protection: Surgical   Other Topics Concern  . Not on file   Social History Narrative   Single   Former Smoker  -  quit 10 to 11 years ago (light smoker)   Alcohol use-yes     2 children    Occupation: [employer]     Past Surgical History  Procedure Laterality Date  . Breast biopsy    . Appendectomy    . Abdominal hysterectomy    . Tubal ligation    . Inguinal hernia repair      Family  History  Problem Relation Age of Onset  . Aneurysm Mother 46    deceased secondary to brain aneurysm  . Liver cancer Father 7    deceased  . Diabetes      grandmother    Allergies  Allergen Reactions  . Penicillins     REACTION: urticaria (hives)    Current Outpatient Prescriptions on File Prior to Visit  Medication Sig Dispense Refill  . acetaminophen (TYLENOL) 325 MG tablet Take 650 mg by mouth every 6 (six) hours as needed for pain (pain).      [name] aspirin 81 MG tablet Take 81 mg by mouth daily.        . Calcium Carbonate-Vitamin D (CALTRATE 600+D) 600-400 MG-UNIT per tablet Take 1 tablet by mouth daily.        . ciprofloxacin (CIPRO) 250 MG tablet Take 1 tablet (250 mg total) by mouth 2 (two) times daily.  6 tablet  0  . folic acid (FOLVITE) 1 MG tablet Take 1 mg by mouth daily.        [name] glucose blood (ONETOUCH VERIO) test strip Use as instructed  100 each  12  . HYDROcodone-acetaminophen (NORCO/VICODIN) 5-325 MG per tablet Take 1 tablet by mouth every 6 (six) hours as needed for pain.  15 tablet  0  . lisinopril (PRINIVIL,ZESTRIL) 10 MG tablet Take 0.5 tablets (5 mg total) by mouth daily.  45 tablet  1  . metFORMIN (GLUCOPHAGE) 1000 MG tablet Take 1 tablet (1,000 mg total) by mouth 2 (two) times daily with a meal.  180 tablet  1  . Multiple Vitamin (MULTIVITAMIN) tablet Take 1 tablet by mouth daily.        . Omega-3 Fatty Acids (FISH OIL) 1000 MG CAPS Take 1,000 capsules by mouth daily.        . ONE TOUCH LANCETS MISC Use to check blood sugar three time per day (250.00)  100 each  3  . simvastatin (ZOCOR) 40 MG tablet Take 1 tablet (40 mg total) by mouth daily at 6 PM.  90 tablet  1  . [DISCONTINUED] valsartan (DIOVAN) 160 MG tablet Take 1/2 tablet by mouth once daily  30 tablet  5   No current facility-administered medications on file prior to visit.    BP 108/70  Pulse 89  Temp(Src) 98 F (36.7 C) (Oral)  Wt 137 lb (62.143 kg)  SpO2 98%chart     Objective:    Physical Exam  Constitutional: She is oriented to person, place, and time. She appears well-developed and well-nourished.  HENT:  Head: Normocephalic.  Right Ear: External ear normal.  Left Ear: External ear normal.  Frontal and maxillary sinuses tender to touch  Neck: Normal range of motion. Neck supple.  Cardiovascular: Normal rate, regular rhythm and normal heart sounds.   Pulmonary/Chest: Effort normal and breath sounds normal. No respiratory distress. She has no wheezes.  Neurological: She is alert and oriented to person, place, and time.  Skin: Skin is warm and dry.  Psychiatric: She has a normal mood and affect.          Assessment & Plan:  Assessment 1. Sinusitis 2. Allergic Rhinitis  Plan 1. Doxycycline 100 mg Po BID x 10 days.  2.Flonase two puffs each nostril.  3.Encouge fluid intake. 4.Contact office with questions or concerns.

## 2014-03-02 NOTE — Telephone Encounter (Signed)
abx changed to z-pak, Pharmacist notified to cancel doxy

## 2014-03-02 NOTE — Telephone Encounter (Signed)
Lance from [employer] calling in regards to pt's rxdoxycycline (VIBRA-TABS) 100 MG tablet, pt states the co-pay is to high and she is needing another antibiotic pt states the co-pay was $50.

## 2014-04-24 ENCOUNTER — Telehealth: Payer: Self-pay | Admitting: Internal Medicine

## 2014-04-24 DIAGNOSIS — H539 Unspecified visual disturbance: Secondary | ICD-10-CM

## 2014-04-24 MED ORDER — METFORMIN HCL 1000 MG PO TABS: 1000.0000 mg | ORAL_TABLET | Freq: Two times a day (BID) | ORAL | Status: DC

## 2014-04-24 NOTE — Telephone Encounter (Signed)
[place], [place] - [place] is requesting re-fill on metFORMIN (GLUCOPHAGE) 1000 MG tablet

## 2014-04-24 NOTE — Telephone Encounter (Signed)
Pt would like referral top return to [facility], dr hollander to check her eyes. Pt never went back in dec to appt when he first referred pt.

## 2014-04-24 NOTE — Telephone Encounter (Signed)
rx sent in electronically 

## 2014-04-24 NOTE — Telephone Encounter (Signed)
Referral order placed.

## 2014-04-30 ENCOUNTER — Ambulatory Visit (INDEPENDENT_AMBULATORY_CARE_PROVIDER_SITE_OTHER): Payer: Medicare Other | Admitting: Physician Assistant

## 2014-04-30 ENCOUNTER — Encounter: Payer: Self-pay | Admitting: Physician Assistant

## 2014-04-30 VITALS — BP 132/90 | HR 72 | Temp 97.9°F | Resp 18 | Wt 138.0 lb

## 2014-04-30 DIAGNOSIS — R300 Dysuria: Secondary | ICD-10-CM

## 2014-04-30 LAB — POCT URINALYSIS DIPSTICK
Bilirubin, UA: NEGATIVE
GLUCOSE UA: NEGATIVE
Ketones, UA: NEGATIVE
Nitrite, UA: NEGATIVE
Protein, UA: NEGATIVE
RBC UA: NEGATIVE
Spec Grav, UA: 1.02
UROBILINOGEN UA: 0.2
pH, UA: 6.5

## 2014-04-30 MED ORDER — METFORMIN HCL 1000 MG PO TABS: 1000.0000 mg | ORAL_TABLET | Freq: Two times a day (BID) | ORAL | Status: DC

## 2014-04-30 MED ORDER — CIPROFLOXACIN HCL 500 MG PO TABS: 500.0000 mg | ORAL_TABLET | Freq: Two times a day (BID) | ORAL | Status: DC

## 2014-04-30 NOTE — Progress Notes (Signed)
Pre visit review using our clinic review tool, if applicable. No additional management support is needed unless otherwise documented below in the visit note. 

## 2014-04-30 NOTE — Patient Instructions (Signed)
We will call you with the results of your urine culture when they're available.  Cipro twice a day for 5 days for UTI.  Increase fluid intake.  Followup as needed, or for worsening or persistent symptoms despite treatment.    Urinary Tract Infection A urinary tract infection (UTI) can occur any place along the urinary tract. The tract includes the kidneys, ureters, bladder, and urethra. A type of germ called bacteria often causes a UTI. UTIs are often helped with antibiotic medicine.  HOME CARE   If given, take antibiotics as told by your doctor. Finish them even if you start to feel better.  Drink enough fluids to keep your pee (urine) clear or pale yellow.  Avoid tea, drinks with caffeine, and bubbly (carbonated) drinks.  Pee often. Avoid holding your pee in for a long time.  Pee before and after having sex (intercourse).  Wipe from front to back after you poop (bowel movement) if you are a woman. Use each tissue only once. GET HELP RIGHT AWAY IF:   You have back pain.  You have lower belly (abdominal) pain.  You have chills.  You feel sick to your stomach (nauseous).  You throw up (vomit).  Your burning or discomfort with peeing does not go away.  You have a fever.  Your symptoms are not better in 3 days. MAKE SURE YOU:   Understand these instructions.  Will watch your condition.  Will get help right away if you are not doing well or get worse. Document Released: 04/27/2008 Document Revised: 08/03/2012 Document Reviewed: 06/09/2012 [facility] Patient Information 2014 [place], [place].

## 2014-04-30 NOTE — Progress Notes (Signed)
Subjective:    Patient ID: Cathy [name], female    DOB: [birth year], 68 y.o.   MRN: [id number]  Dysuria  This is a new problem. The current episode started in the past 7 days. The problem occurs every urination. The problem has been unchanged. The quality of the pain is described as aching. The pain is at a severity of 4/10. The pain is mild. There has been no fever. Associated symptoms include frequency and urgency. Pertinent negatives include no chills, discharge, flank pain, hematuria, hesitancy, nausea, possible pregnancy, sweats or vomiting. She has tried nothing for the symptoms. Her past medical history is significant for recurrent UTIs. There is no history of catheterization, kidney stones, a single kidney or a urological procedure.      Review of Systems  Constitutional: Negative for fever and chills.  Respiratory: Negative for shortness of breath.   Gastrointestinal: Negative for nausea, vomiting, abdominal pain and diarrhea.  Genitourinary: Positive for dysuria, urgency and frequency. Negative for hesitancy, hematuria and flank pain.  All other systems reviewed and are negative.     Past Medical History  Diagnosis Date  . Diabetes mellitus type II   . Hyperlipidemia   . Hypertension   . History of depression   . DJD (degenerative joint disease)   . Low back pain   . History of MRSA infection     recurrent carbuncle   Past Surgical History  Procedure Laterality Date  . Breast biopsy    . Appendectomy    . Abdominal hysterectomy    . Tubal ligation    . Inguinal hernia repair      reports that she has quit smoking. She has never used smokeless tobacco. She reports that she drinks alcohol. She reports that she does not use illicit drugs. family history includes Aneurysm (age of onset: 41) in her mother; Diabetes in an other family member; Liver cancer (age of onset: 24) in her father. Allergies  Allergen Reactions  . Penicillins     REACTION: urticaria  (hives)       Objective:   Physical Exam  Nursing note and vitals reviewed. Constitutional: She is oriented to person, place, and time. She appears well-developed and well-nourished. No distress.  HENT:  Head: Normocephalic and atraumatic.  Eyes: Conjunctivae and EOM are normal. Pupils are equal, round, and reactive to light.  Neck: Normal range of motion. Neck supple.  Cardiovascular: Normal rate, regular rhythm, normal heart sounds and intact distal pulses.  Exam reveals no gallop and no friction rub.   No murmur heard. Pulmonary/Chest: Effort normal and breath sounds normal. No respiratory distress. She has no wheezes. She has no rales. She exhibits no tenderness.  Abdominal: Soft. Bowel sounds are normal. She exhibits no distension and no mass. There is no tenderness. There is no rebound and no guarding.  No CVA tenderness.  Musculoskeletal: Normal range of motion.  Neurological: She is alert and oriented to person, place, and time.  Skin: Skin is warm and dry. No rash noted. She is not diaphoretic. No erythema. No pallor.  Psychiatric: She has a normal mood and affect. Her behavior is normal. Judgment and thought content normal.      Filed Vitals:   04/30/14 1445  BP: 132/90  Pulse: 72  Temp: 97.9 F (36.6 C)  Resp: 18     Lab Results  Component Value Date   WBC 9.8 07/20/2009   HGB 13.3 07/20/2009   HCT 41.3 07/20/2009   PLT 267  07/20/2009   GLUCOSE 88 11/21/2013   CHOL 206* 11/21/2013   TRIG 62.0 11/21/2013   HDL 72.20 11/21/2013   LDLDIRECT 118.2 11/21/2013   LDLCALC 97 10/24/2012   ALT 13 11/21/2013   AST 21 11/21/2013   NA 138 11/21/2013   K 4.0 11/21/2013   CL 104 11/21/2013   CREATININE 0.8 11/21/2013   BUN 12 11/21/2013   CO2 29 11/21/2013   TSH 0.42 11/21/2013   HGBA1C 6.3 11/21/2013   MICROALBUR 0.6 10/24/2012     Urinalysis Component     Latest Ref Rng 04/30/2014  Color, UA      yellow  Clarity, UA      clear  Glucose      n  Bilirubin,  UA      n  Ketones, UA      n  Specific Gravity, UA      1.020  RBC, UA      n  pH, UA      6.5  Protein, UA      n  Urobilinogen, UA      0.2  Nitrite, UA      n  Leukocytes, UA      Trace       Assessment & Plan:  Fallon was seen today for cystitis.  Diagnoses and associated orders for this visit:  Dysuria Comments: Discussed risks and benefits of waiting for culture or treating empirically with pt. Pt wished to go ahead and treat. Will treat with cipro. - POCT urinalysis dipstick - Culture, Urine - ciprofloxacin (CIPRO) 500 MG tablet; Take 1 tablet (500 mg total) by mouth 2 (two) times daily.  Other Orders - metFORMIN (GLUCOPHAGE) 1000 MG tablet; Take 1 tablet (1,000 mg total) by mouth 2 (two) times daily with a meal.    Return precautions provided.   Plan to follow up as needed, or for worsening or persistent symptoms despite treatment.    Patient Instructions  We will call you with the results of your urine culture when they're available.  Cipro twice a day for 5 days for UTI.  Increase fluid intake.  Followup as needed, or for worsening or persistent symptoms despite treatment.

## 2014-05-04 ENCOUNTER — Other Ambulatory Visit: Payer: Self-pay | Admitting: *Deleted

## 2014-05-04 DIAGNOSIS — R300 Dysuria: Secondary | ICD-10-CM

## 2014-05-04 MED ORDER — CIPROFLOXACIN HCL 500 MG PO TABS: 500.0000 mg | ORAL_TABLET | Freq: Two times a day (BID) | ORAL | Status: DC

## 2014-05-05 LAB — URINE CULTURE: Colony Count: 15000

## 2014-05-17 LAB — HM DIABETES EYE EXAM

## 2014-05-23 ENCOUNTER — Other Ambulatory Visit: Payer: Self-pay | Admitting: Internal Medicine

## 2014-05-28 NOTE — Telephone Encounter (Signed)
Documented eye exam

## 2014-06-05 ENCOUNTER — Telehealth: Payer: Self-pay | Admitting: Internal Medicine

## 2014-06-05 DIAGNOSIS — E119 Type 2 diabetes mellitus without complications: Secondary | ICD-10-CM

## 2014-06-05 DIAGNOSIS — E785 Hyperlipidemia, unspecified: Secondary | ICD-10-CM

## 2014-06-05 NOTE — Telephone Encounter (Signed)
Pt called to say that she should be having labs done this month because of her diabetes before she see Dr Shawna Orleans next month. Can you put lab orders in .

## 2014-06-05 NOTE — Telephone Encounter (Signed)
Future orders placed, please call to schedule

## 2014-06-13 NOTE — Telephone Encounter (Signed)
Pt has been scheduled.  °

## 2014-07-06 ENCOUNTER — Other Ambulatory Visit (INDEPENDENT_AMBULATORY_CARE_PROVIDER_SITE_OTHER): Payer: Medicare Other

## 2014-07-06 DIAGNOSIS — E785 Hyperlipidemia, unspecified: Secondary | ICD-10-CM

## 2014-07-06 DIAGNOSIS — E119 Type 2 diabetes mellitus without complications: Secondary | ICD-10-CM

## 2014-07-06 LAB — BASIC METABOLIC PANEL
BUN: 16 mg/dL (ref 6–23)
CHLORIDE: 103 meq/L (ref 96–112)
CO2: 27 mEq/L (ref 19–32)
CREATININE: 0.8 mg/dL (ref 0.4–1.2)
Calcium: 10 mg/dL (ref 8.4–10.5)
GFR: 95.11 mL/min (ref >60.00)
Glucose, Bld: 94 mg/dL (ref 70–99)
Potassium: 4.3 mEq/L (ref 3.5–5.1)
Sodium: 137 mEq/L (ref 135–145)

## 2014-07-06 LAB — LIPID PANEL
Cholesterol: 184 mg/dL (ref 0–200)
HDL: 74 mg/dL (ref >39.00)
LDL Cholesterol: 103 mg/dL — ABNORMAL HIGH (ref 0–99)
NonHDL: 110
TRIGLYCERIDES: 36 mg/dL (ref 0.0–149.0)
Total CHOL/HDL Ratio: 2
VLDL: 7.2 mg/dL (ref 0.0–40.0)

## 2014-07-06 LAB — HEMOGLOBIN A1C: Hgb A1c MFr Bld: 6.4 % (ref 4.6–6.5)

## 2014-07-10 ENCOUNTER — Other Ambulatory Visit: Payer: Self-pay | Admitting: Internal Medicine

## 2014-07-10 ENCOUNTER — Telehealth: Payer: Self-pay | Admitting: Internal Medicine

## 2014-07-10 DIAGNOSIS — E785 Hyperlipidemia, unspecified: Secondary | ICD-10-CM

## 2014-07-10 NOTE — Telephone Encounter (Signed)
Pt does not want to take lipitor because of muscle aches.  Please advise

## 2014-07-10 NOTE — Telephone Encounter (Signed)
Pt gave you the wrong medication info.  Pt states she takes one whole tab of simvastatin (ZOCOR) 40 MG tablet and not 1/2. Pt will like a cb

## 2014-07-10 NOTE — Telephone Encounter (Signed)
I would like for her to switch cholesterol medication. Stop simvastatin.  Call in atorvastatin 40 mg # 90 one po qd.  Return in 2 months for repeat FLP and LFTs.

## 2014-07-11 ENCOUNTER — Ambulatory Visit: Payer: Medicare Other | Admitting: Internal Medicine

## 2014-07-11 NOTE — Telephone Encounter (Signed)
Ok to keep taking simvastatin except try taking higher dose 40 mg.  #90 one po qd. RF x 1

## 2014-07-12 ENCOUNTER — Ambulatory Visit: Payer: Medicare Other | Admitting: Internal Medicine

## 2014-07-16 NOTE — Telephone Encounter (Signed)
Pt aware.

## 2014-07-16 NOTE — Telephone Encounter (Signed)
Left message for pt to call back  °

## 2014-07-17 ENCOUNTER — Ambulatory Visit (INDEPENDENT_AMBULATORY_CARE_PROVIDER_SITE_OTHER): Payer: Medicare Other | Admitting: Internal Medicine

## 2014-07-17 ENCOUNTER — Encounter: Payer: Self-pay | Admitting: Internal Medicine

## 2014-07-17 VITALS — BP 114/62 | HR 84 | Temp 98.2°F | Ht 63.5 in | Wt 132.0 lb

## 2014-07-17 DIAGNOSIS — E785 Hyperlipidemia, unspecified: Secondary | ICD-10-CM

## 2014-07-17 DIAGNOSIS — R300 Dysuria: Secondary | ICD-10-CM

## 2014-07-17 DIAGNOSIS — Z23 Encounter for immunization: Secondary | ICD-10-CM

## 2014-07-17 DIAGNOSIS — N390 Urinary tract infection, site not specified: Secondary | ICD-10-CM

## 2014-07-17 DIAGNOSIS — E119 Type 2 diabetes mellitus without complications: Secondary | ICD-10-CM

## 2014-07-17 LAB — POCT URINALYSIS DIPSTICK
BILIRUBIN UA: NEGATIVE
Glucose, UA: NEGATIVE
Ketones, UA: NEGATIVE
Nitrite, UA: NEGATIVE
Protein, UA: NEGATIVE
RBC UA: NEGATIVE
SPEC GRAV UA: 1.015
UROBILINOGEN UA: 0.2
pH, UA: 5.5

## 2014-07-17 MED ORDER — SULFAMETHOXAZOLE-TMP DS 800-160 MG PO TABS
1.0000 | ORAL_TABLET | Freq: Two times a day (BID) | ORAL | Status: DC
Start: 2014-07-17 — End: 2014-07-20

## 2014-07-17 MED ORDER — SIMVASTATIN 40 MG PO TABS: 40.0000 mg | ORAL_TABLET | Freq: Every day | ORAL | Status: DC

## 2014-07-17 NOTE — Assessment & Plan Note (Signed)
Stable.  Continue metformin. Lab Results  Component Value Date   HGBA1C 6.4 07/06/2014   HGBA1C 6.3 11/21/2013   HGBA1C 6.4 08/02/2013   Lab Results  Component Value Date   MICROALBUR 0.6 10/24/2012   LDLCALC 103* 07/06/2014   CREATININE 0.8 07/06/2014

## 2014-07-17 NOTE — Assessment & Plan Note (Signed)
Patient advised to take full dose of simvastatin and follow low saturated fat diet.  Repeat FLP in 1 month.

## 2014-07-17 NOTE — Progress Notes (Signed)
Subjective:    Patient ID: Cathy [name], female    DOB: 26-Jan-1946, 68 y.o.   MRN: [id number]  HPI  68 year old African American female with history of type 2 diabetes controlled, hypertension, dyslipidemia and frequent urinary tract infections for routine followup. Patient denies any significant interval medical history. Patient reports her blood pressure is well-controlled. She denies any lightheadedness or dizziness.  Type 2 diabetes-her A1c is stable.  Hyperlipidemia - she takes her cholesterol medication but does not follow low saturated fat diet.  She could not tolerate lipitor in the past.    Review of Systems    urinary frequency, negative for fever or chills  Past Medical History  Diagnosis Date  . Diabetes mellitus type II   . Hyperlipidemia   . Hypertension   . History of depression   . DJD (degenerative joint disease)   . Low back pain   . History of MRSA infection     recurrent carbuncle    History   Social History  . Marital Status: Single    Spouse Name: N/A    Number of Children: N/A  . Years of Education: N/A   Occupational History  . Not on file.   Social History Main Topics  . Smoking status: Former Research scientist (life sciences)  . Smokeless tobacco: Never Used     Comment: quit 10-11 years ago light smoker  . Alcohol Use: Yes     Comment: occ  . Drug Use: No  . Sexual Activity: Not Currently    Birth Control/ Protection: Surgical   Other Topics Concern  . Not on file   Social History Narrative   Single   Former Smoker  -  quit 10 to 11 years ago (light smoker)   Alcohol use-yes     2 children    Occupation: [employer]     Past Surgical History  Procedure Laterality Date  . Breast biopsy    . Appendectomy    . Abdominal hysterectomy    . Tubal ligation    . Inguinal hernia repair      Family History  Problem Relation Age of Onset  . Aneurysm Mother 78    deceased secondary to brain aneurysm  . Liver cancer Father 27   deceased  . Diabetes      grandmother    Allergies  Allergen Reactions  . Penicillins     REACTION: urticaria (hives)    Current Outpatient Prescriptions on File Prior to Visit  Medication Sig Dispense Refill  . acetaminophen (TYLENOL) 325 MG tablet Take 650 mg by mouth every 6 (six) hours as needed for pain (pain).      [name] aspirin 81 MG tablet Take 81 mg by mouth daily.        . Calcium Carbonate-Vitamin D (CALTRATE 600+D) 600-400 MG-UNIT per tablet Take 1 tablet by mouth daily.        . folic acid (FOLVITE) 1 MG tablet Take 1 mg by mouth daily.        [name] glucose blood (ONETOUCH VERIO) test strip Use as instructed  100 each  12  . HYDROcodone-acetaminophen (NORCO/VICODIN) 5-325 MG per tablet Take 1 tablet by mouth every 6 (six) hours as needed for pain.  15 tablet  0  . lisinopril (PRINIVIL,ZESTRIL) 10 MG tablet take 1/2 tablet by mouth once daily  45 tablet  1  . metFORMIN (GLUCOPHAGE) 1000 MG tablet Take 1 tablet (1,000 mg total) by mouth 2 (two)  times daily with a meal.  180 tablet  1  . Multiple Vitamin (MULTIVITAMIN) tablet Take 1 tablet by mouth daily.        . Omega-3 Fatty Acids (FISH OIL) 1000 MG CAPS Take 1,000 capsules by mouth daily.        . ONE TOUCH LANCETS MISC Use to check blood sugar three time per day (250.00)  100 each  3  . [DISCONTINUED] valsartan (DIOVAN) 160 MG tablet Take 1/2 tablet by mouth once daily  30 tablet  5   No current facility-administered medications on file prior to visit.    BP 114/62  Pulse 84  Temp(Src) 98.2 F (36.8 C) (Oral)  Ht 5' 3.5" (1.613 m)  Wt 132 lb (59.875 kg)  BMI 23.01 kg/m2    Objective:   Physical Exam  Constitutional: She is oriented to person, place, and time. She appears well-developed and well-nourished.  HENT:  Head: Normocephalic and atraumatic.  Neck: Neck supple.  No carotid bruit  Cardiovascular: Normal rate, regular rhythm, normal heart sounds and intact distal pulses.   No murmur heard. Pulmonary/Chest:  Effort normal and breath sounds normal.  Musculoskeletal: She exhibits no edema.  Neurological: She is alert and oriented to person, place, and time. No cranial nerve deficit.  Skin: Skin is warm and dry.  Psychiatric: She has a normal mood and affect. Her behavior is normal.          Assessment & Plan:

## 2014-07-17 NOTE — Progress Notes (Signed)
Pre visit review using our clinic review tool, if applicable. No additional management support is needed unless otherwise documented below in the visit note. 

## 2014-07-17 NOTE — Assessment & Plan Note (Signed)
Patient has hx of recurrent UTI.  She is experiencing dysuria.  Treat with Bactrim DS for 5 days.  Send urine culture.

## 2014-07-17 NOTE — Patient Instructions (Signed)
Please complete the following lab tests within one month: FLP, LFTs - 272.4

## 2014-07-20 ENCOUNTER — Other Ambulatory Visit: Payer: Self-pay | Admitting: Internal Medicine

## 2014-07-20 ENCOUNTER — Telehealth: Payer: Self-pay | Admitting: *Deleted

## 2014-07-20 LAB — URINE CULTURE: Colony Count: >=100000

## 2014-07-20 MED ORDER — SULFAMETHOXAZOLE-TMP DS 800-160 MG PO TABS: 1.0000 | ORAL_TABLET | Freq: Two times a day (BID) | ORAL | Status: DC

## 2014-07-20 MED ORDER — NITROFURANTOIN MACROCRYSTAL 100 MG PO CAPS: 100.0000 mg | ORAL_CAPSULE | Freq: Two times a day (BID) | ORAL | Status: DC

## 2014-07-20 NOTE — Telephone Encounter (Signed)
Left message for pt to call back  °

## 2014-07-20 NOTE — Telephone Encounter (Signed)
Pt wanted rx sent in to [facility], rx sent in electroncially

## 2014-07-20 NOTE — Telephone Encounter (Signed)
Message copied by Pearletha Forge on Fri Jul 20, 2014  2:28 PM ------      Message from: Rosine Abe      Created: Wed Jul 18, 2014 11:55 AM       Call pt - UA was positive.  I called in Bactrim DS to her pharmacy to take bid for 5 days. ------

## 2014-07-20 NOTE — Addendum Note (Signed)
Addended by: Townsend Roger D on: 07/20/2014 03:03 PM   Modules accepted: Orders

## 2014-07-23 ENCOUNTER — Telehealth: Payer: Self-pay

## 2014-07-23 NOTE — Telephone Encounter (Signed)
LVM for pt to call back.   Lab results to give.

## 2014-07-24 ENCOUNTER — Telehealth: Payer: Self-pay | Admitting: Internal Medicine

## 2014-07-24 NOTE — Telephone Encounter (Signed)
Pt is calling back to let assistant know she can not get abx until tomorrow.

## 2014-07-25 NOTE — Telephone Encounter (Signed)
Noted  

## 2014-08-03 ENCOUNTER — Other Ambulatory Visit (INDEPENDENT_AMBULATORY_CARE_PROVIDER_SITE_OTHER): Payer: Medicare Other

## 2014-08-03 DIAGNOSIS — E785 Hyperlipidemia, unspecified: Secondary | ICD-10-CM

## 2014-08-03 DIAGNOSIS — E119 Type 2 diabetes mellitus without complications: Secondary | ICD-10-CM

## 2014-08-03 LAB — BASIC METABOLIC PANEL
BUN: 18 mg/dL (ref 6–23)
CO2: 29 mEq/L (ref 19–32)
Calcium: 9.5 mg/dL (ref 8.4–10.5)
Chloride: 101 mEq/L (ref 96–112)
Creatinine, Ser: 0.8 mg/dL (ref 0.4–1.2)
GFR: 97.99 mL/min (ref >60.00)
GLUCOSE: 96 mg/dL (ref 70–99)
Potassium: 4.5 mEq/L (ref 3.5–5.1)
Sodium: 138 mEq/L (ref 135–145)

## 2014-08-03 LAB — HEPATIC FUNCTION PANEL
ALBUMIN: 4.1 g/dL (ref 3.5–5.2)
ALT: 15 U/L (ref 0–35)
AST: 18 U/L (ref 0–37)
Alkaline Phosphatase: 42 U/L (ref 39–117)
BILIRUBIN DIRECT: 0.1 mg/dL (ref 0.0–0.3)
TOTAL PROTEIN: 7 g/dL (ref 6.0–8.3)
Total Bilirubin: 0.7 mg/dL (ref 0.2–1.2)

## 2014-08-03 LAB — LIPID PANEL
CHOL/HDL RATIO: 2
Cholesterol: 169 mg/dL (ref 0–200)
HDL: 67.7 mg/dL (ref >39.00)
LDL Cholesterol: 93 mg/dL (ref 0–99)
NONHDL: 101.3
TRIGLYCERIDES: 41 mg/dL (ref 0.0–149.0)
VLDL: 8.2 mg/dL (ref 0.0–40.0)

## 2014-08-03 LAB — MICROALBUMIN / CREATININE URINE RATIO
Creatinine,U: 105.4 mg/dL
Microalb Creat Ratio: 2.8 mg/g (ref 0.0–30.0)
Microalb, Ur: 3 mg/dL — ABNORMAL HIGH (ref 0.0–1.9)

## 2014-08-03 LAB — HEMOGLOBIN A1C: Hgb A1c MFr Bld: 6.4 % (ref 4.6–6.5)

## 2014-08-19 ENCOUNTER — Other Ambulatory Visit: Payer: Self-pay | Admitting: Internal Medicine

## 2014-09-20 ENCOUNTER — Telehealth: Payer: Self-pay

## 2014-09-20 NOTE — Telephone Encounter (Signed)
LVM for pt to call back.   RE: AWV for 2015 needs to be scheduled with NP or PA if pt allows.

## 2014-09-24 ENCOUNTER — Encounter: Payer: Self-pay | Admitting: Internal Medicine

## 2014-10-15 ENCOUNTER — Other Ambulatory Visit: Payer: Self-pay | Admitting: Internal Medicine

## 2014-10-15 DIAGNOSIS — Z1231 Encounter for screening mammogram for malignant neoplasm of breast: Secondary | ICD-10-CM

## 2014-10-26 ENCOUNTER — Encounter: Payer: Commercial Managed Care - HMO | Admitting: Internal Medicine

## 2014-10-26 ENCOUNTER — Other Ambulatory Visit (INDEPENDENT_AMBULATORY_CARE_PROVIDER_SITE_OTHER): Payer: Medicare HMO

## 2014-10-26 DIAGNOSIS — Z0000 Encounter for general adult medical examination without abnormal findings: Secondary | ICD-10-CM

## 2014-10-26 LAB — POCT URINALYSIS DIPSTICK
Bilirubin, UA: NEGATIVE
GLUCOSE UA: NEGATIVE
Ketones, UA: NEGATIVE
Leukocytes, UA: NEGATIVE
Nitrite, UA: NEGATIVE
Protein, UA: NEGATIVE
RBC UA: NEGATIVE
Spec Grav, UA: 1.01
UROBILINOGEN UA: 0.2
pH, UA: 6.5

## 2014-10-28 LAB — CBC WITH DIFFERENTIAL/PLATELET
BASOS ABS: 0 10*3/uL (ref 0.0–0.1)
BASOS PCT: 0.4 % (ref 0.0–3.0)
EOS PCT: 3.9 % (ref 0.0–5.0)
Eosinophils Absolute: 0.3 10*3/uL (ref 0.0–0.7)
HEMATOCRIT: 40.9 % (ref 36.0–46.0)
HEMOGLOBIN: 12.8 g/dL (ref 12.0–15.0)
LYMPHS ABS: 2.3 10*3/uL (ref 0.7–4.0)
Lymphocytes Relative: 30 % (ref 12.0–46.0)
MCHC: 31.3 g/dL (ref 30.0–36.0)
MCV: 79.8 fl (ref 78.0–100.0)
MONO ABS: 0.3 10*3/uL (ref 0.1–1.0)
Monocytes Relative: 4.3 % (ref 3.0–12.0)
NEUTROS ABS: 4.7 10*3/uL (ref 1.4–7.7)
Neutrophils Relative %: 61.4 % (ref 43.0–77.0)
Platelets: 294 10*3/uL (ref 150.0–400.0)
RBC: 5.12 Mil/uL — ABNORMAL HIGH (ref 3.87–5.11)
RDW: 15.5 % (ref 11.5–15.5)
WBC: 7.6 10*3/uL (ref 4.0–10.5)

## 2014-10-28 LAB — MICROALBUMIN / CREATININE URINE RATIO
Creatinine,U: 74.8 mg/dL
Microalb Creat Ratio: 0.5 mg/g (ref 0.0–30.0)
Microalb, Ur: 0.4 mg/dL (ref 0.0–1.9)

## 2014-10-28 LAB — COMPREHENSIVE METABOLIC PANEL
ALBUMIN: 4.2 g/dL (ref 3.5–5.2)
ALT: 18 U/L (ref 0–35)
AST: 24 U/L (ref 0–37)
Alkaline Phosphatase: 42 U/L (ref 39–117)
BUN: 14 mg/dL (ref 6–23)
CALCIUM: 9.7 mg/dL (ref 8.4–10.5)
CHLORIDE: 102 meq/L (ref 96–112)
CO2: 27 meq/L (ref 19–32)
Creatinine, Ser: 0.8 mg/dL (ref 0.4–1.2)
GFR: 92.29 mL/min (ref >60.00)
Glucose, Bld: 78 mg/dL (ref 70–99)
POTASSIUM: 4.6 meq/L (ref 3.5–5.1)
Sodium: 138 mEq/L (ref 135–145)
TOTAL PROTEIN: 6.7 g/dL (ref 6.0–8.3)
Total Bilirubin: 0.7 mg/dL (ref 0.2–1.2)

## 2014-10-28 LAB — LIPID PANEL
CHOL/HDL RATIO: 3
Cholesterol: 210 mg/dL — ABNORMAL HIGH (ref 0–200)
HDL: 80 mg/dL (ref >39.00)
LDL Cholesterol: 116 mg/dL — ABNORMAL HIGH (ref 0–99)
NonHDL: 130
Triglycerides: 72 mg/dL (ref 0.0–149.0)
VLDL: 14.4 mg/dL (ref 0.0–40.0)

## 2014-10-28 LAB — HEMOGLOBIN A1C: Hgb A1c MFr Bld: 6.6 % — ABNORMAL HIGH (ref 4.6–6.5)

## 2014-10-28 LAB — TSH: TSH: 0.85 u[IU]/mL (ref 0.35–4.50)

## 2014-10-29 ENCOUNTER — Telehealth: Payer: Self-pay | Admitting: Internal Medicine

## 2014-10-29 NOTE — Telephone Encounter (Signed)
Pt aware.

## 2014-10-29 NOTE — Telephone Encounter (Signed)
Her UA is normal.

## 2014-10-29 NOTE — Telephone Encounter (Signed)
Pt would like to know if her urine lab has come back? Pt in Friday for lab only/  has poss uti. Ok to leave message bc pt doesn't get off work until [place] /friday

## 2014-11-01 ENCOUNTER — Encounter: Payer: Self-pay | Admitting: Family Medicine

## 2014-11-01 ENCOUNTER — Ambulatory Visit (INDEPENDENT_AMBULATORY_CARE_PROVIDER_SITE_OTHER): Payer: Medicare HMO | Admitting: Family Medicine

## 2014-11-01 VITALS — BP 100/72 | HR 80 | Temp 97.6°F | Ht 63.5 in | Wt 132.7 lb

## 2014-11-01 DIAGNOSIS — R21 Rash and other nonspecific skin eruption: Secondary | ICD-10-CM

## 2014-11-01 MED ORDER — TRIAMCINOLONE ACETONIDE 0.1 % EX CREA: 1.0000 "application " | TOPICAL_CREAM | Freq: Two times a day (BID) | CUTANEOUS | Status: DC

## 2014-11-01 NOTE — Progress Notes (Signed)
Pre visit review using our clinic review tool, if applicable. No additional management support is needed unless otherwise documented below in the visit note. 

## 2014-11-01 NOTE — Patient Instructions (Signed)
-   cerave cream at least 1-2 times daily  -triamcinilone cream to itchy areas 2 times daily for 2 weeks (do not use on face or in creases)  - hypoallergenic soap (such as dove without scent) and laundry detergent and now other products on skin

## 2014-11-01 NOTE — Progress Notes (Signed)
HPI:  Rash: -started about 2 months ago then resolved, then recurred last week and friend told her it was shingles -scattered itchy papule, resolves and then new ones pop out -resolves with hydrocortisone cream -denies: malaise, fevers, pain, drainage, SOB -got a new mattress recently as thought had bedbugs and used a new lotion lately and tide laundry soap which is new; also new body wash the last few months -at work a bug was on her and bit her -denies pets   ROS: See pertinent positives and negatives per HPI.  Past Medical History  Diagnosis Date  . Diabetes mellitus type II   . Hyperlipidemia   . Hypertension   . History of depression   . DJD (degenerative joint disease)   . Low back pain   . History of MRSA infection     recurrent carbuncle    Past Surgical History  Procedure Laterality Date  . Breast biopsy    . Appendectomy    . Abdominal hysterectomy    . Tubal ligation    . Inguinal hernia repair      Family History  Problem Relation Age of Onset  . Aneurysm Mother 91    deceased secondary to brain aneurysm  . Liver cancer Father 30    deceased  . Diabetes      grandmother    History   Social History  . Marital Status: Single    Spouse Name: N/A    Number of Children: N/A  . Years of Education: N/A   Social History Main Topics  . Smoking status: Former Research scientist (life sciences)  . Smokeless tobacco: Never Used     Comment: quit 10-11 years ago light smoker  . Alcohol Use: Yes     Comment: occ  . Drug Use: No  . Sexual Activity: Not Currently    Birth Control/ Protection: Surgical   Other Topics Concern  . None   Social History Narrative   Single   Former Smoker  -  quit 10 to 11 years ago (light smoker)   Alcohol use-yes     2 children    Occupation: [employer]     Current outpatient prescriptions: CRANBERRY PO, Take by mouth., Disp: , Rfl: ;  acetaminophen (TYLENOL) 325 MG tablet, Take 650 mg by mouth every 6 (six) hours as needed for  pain (pain)., Disp: , Rfl: ;  aspirin 81 MG tablet, Take 81 mg by mouth daily.  , Disp: , Rfl: ;  Calcium Carbonate-Vitamin D (CALTRATE 600+D) 600-400 MG-UNIT per tablet, Take 1 tablet by mouth daily.  , Disp: , Rfl:  folic acid (FOLVITE) 1 MG tablet, Take 1 mg by mouth daily.  , Disp: , Rfl: ;  Garlic 992 MG TABS, Take 1 tablet by mouth daily., Disp: , Rfl: ;  glucose blood (ONETOUCH VERIO) test strip, Use as instructed, Disp: 100 each, Rfl: 12;  HYDROcodone-acetaminophen (NORCO/VICODIN) 5-325 MG per tablet, Take 1 tablet by mouth every 6 (six) hours as needed for pain., Disp: 15 tablet, Rfl: 0 lisinopril (PRINIVIL,ZESTRIL) 10 MG tablet, take 1/2 tablet by mouth once daily, Disp: 45 tablet, Rfl: 1;  metFORMIN (GLUCOPHAGE) 1000 MG tablet, Take 1 tablet (1,000 mg total) by mouth 2 (two) times daily with a meal., Disp: 180 tablet, Rfl: 1;  Multiple Vitamin (MULTIVITAMIN) tablet, Take 1 tablet by mouth daily.  , Disp: , Rfl: ;  Omega-3 Fatty Acids (FISH OIL) 1000 MG CAPS, Take 1,000 capsules by mouth daily.  , Disp: ,  Rfl:  ONETOUCH DELICA LANCETS FINE MISC, USE TO CHECK BLOOD SUGAR 3 TIMES A DAY, Disp: 100 each, Rfl: 3;  simvastatin (ZOCOR) 40 MG tablet, Take 1 tablet (40 mg total) by mouth daily at 6 PM., Disp: 90 tablet, Rfl: 1;  triamcinolone cream (KENALOG) 0.1 %, Apply 1 application topically 2 (two) times daily., Disp: 30 g, Rfl: 0;  [DISCONTINUED] valsartan (DIOVAN) 160 MG tablet, Take 1/2 tablet by mouth once daily, Disp: 30 tablet, Rfl: 5  EXAM:  Filed Vitals:   11/01/14 1106  BP: 100/72  Pulse: 80  Temp: 97.6 F (36.4 C)    Body mass index is 23.14 kg/(m^2).  GENERAL: vitals reviewed and listed above, alert, oriented, appears well hydrated and in no acute distress  HEENT: atraumatic, conjunttiva clear, no obvious abnormalities on inspection of external nose and ears  NECK: no obvious masses on inspection  SKIN: few scattered pachthes of erythematous mildly raised skin and dry skin, few  excoriated papules  MS: moves all extremities without noticeable abnormality  PSYCH: pleasant and cooperative, no obvious depression or anxiety  ASSESSMENT AND PLAN:  Discussed the following assessment and plan:  Rash and nonspecific skin eruption - Plan: triamcinolone cream (KENALOG) 0.1 %  -looks like allergic dermatitis and has several new factors -query some insect bites and she is going to check for this -trial hypoallergenic regiment and mid potency steroid -follow up as schedule -Patient advised to return or notify a doctor immediately if symptoms worsen or persist or new concerns arise.  Patient Instructions  - cerave cream at least 1-2 times daily  -triamcinilone cream to itchy areas 2 times daily for 2 weeks (do not use on face or in creases)  - hypoallergenic soap (such as dove without scent) and laundry detergent and now other products on skin     KIM, HANNAH R.

## 2014-11-02 ENCOUNTER — Ambulatory Visit (HOSPITAL_COMMUNITY)
Admission: RE | Admit: 2014-11-02 | Discharge: 2014-11-02 | Disposition: A | Discharge status: Home / Self Care | Payer: Medicare HMO | Source: Ambulatory Visit | Attending: Internal Medicine | Admitting: Internal Medicine

## 2014-11-02 DIAGNOSIS — Z1231 Encounter for screening mammogram for malignant neoplasm of breast: Secondary | ICD-10-CM | POA: Diagnosis present

## 2014-11-08 ENCOUNTER — Telehealth: Payer: Self-pay | Admitting: Internal Medicine

## 2014-11-08 NOTE — Telephone Encounter (Signed)
I called the pt and she stated she was aware of this and hung up.

## 2014-11-08 NOTE — Telephone Encounter (Signed)
Pt states she really needs something for the itching. Pt doesn't have the money to come back in to the office for an appt. Pt would like to know if dr Shawna Orleans will call her in something for the itching, especially on her face [facility]

## 2014-11-08 NOTE — Telephone Encounter (Signed)
Patient states the triamcinolone cream (KENALOG) 0.1 % isn't helping.  She is still itching and it has spread to her face.  Patient would like to know if you can prescribe something else?  Please advise  [id number], [place]

## 2014-11-08 NOTE — Telephone Encounter (Signed)
Advise follow up with PCP as scheduled to re-eval. I would not advise using the tiramcinilone on the face. If the rash elsewhere has resolved - can use over the counter hydrocortisone on the face if similar rash. Also ensure she is using the cerave and avoiding all other products unless hypoallergenic. If worsening or new symptoms and she feels needs appt please help her schedule - keep CPE with PCP.

## 2014-11-12 ENCOUNTER — Other Ambulatory Visit: Payer: Self-pay | Admitting: Internal Medicine

## 2014-11-13 ENCOUNTER — Ambulatory Visit: Payer: Medicare HMO | Admitting: *Deleted

## 2014-11-13 DIAGNOSIS — Z23 Encounter for immunization: Secondary | ICD-10-CM

## 2014-11-27 ENCOUNTER — Telehealth: Payer: Self-pay | Admitting: Internal Medicine

## 2014-11-27 NOTE — Telephone Encounter (Signed)
Would like results of labs

## 2014-11-29 NOTE — Telephone Encounter (Signed)
Her electrolytes and kidney function stable.  Cholesterol panel slightly elevated.  Make sure pt taking simvastatin regularly.  A1c stable at 6.6.  She can sign onto mychart to view actual results.

## 2014-11-30 ENCOUNTER — Encounter: Payer: Commercial Managed Care - HMO | Admitting: Internal Medicine

## 2014-12-03 NOTE — Telephone Encounter (Signed)
Spoke with pt about lab results and pt is aware.

## 2014-12-03 NOTE — Telephone Encounter (Signed)
Left a message for return call.  

## 2014-12-04 NOTE — Telephone Encounter (Signed)
Advised pt to call to schedule colonoscopy and to come into the office to be evaluated about "diabetic itching'  Pt verbalized understanding.

## 2014-12-06 ENCOUNTER — Encounter: Payer: Self-pay | Admitting: Family Medicine

## 2014-12-06 ENCOUNTER — Ambulatory Visit (INDEPENDENT_AMBULATORY_CARE_PROVIDER_SITE_OTHER): Payer: Commercial Managed Care - HMO | Admitting: Family Medicine

## 2014-12-06 VITALS — BP 138/72 | HR 82 | Temp 97.7°F | Ht 63.5 in | Wt 136.7 lb

## 2014-12-06 DIAGNOSIS — R350 Frequency of micturition: Secondary | ICD-10-CM

## 2014-12-06 DIAGNOSIS — R21 Rash and other nonspecific skin eruption: Secondary | ICD-10-CM

## 2014-12-06 LAB — POCT URINALYSIS DIPSTICK
Bilirubin, UA: NEGATIVE
GLUCOSE UA: NEGATIVE
KETONES UA: NEGATIVE
Leukocytes, UA: NEGATIVE
Nitrite, UA: NEGATIVE
PROTEIN UA: NEGATIVE
RBC UA: NEGATIVE
Spec Grav, UA: 1.01
Urobilinogen, UA: 0.2
pH, UA: 6

## 2014-12-06 NOTE — Progress Notes (Signed)
HPI:  Itchy Bumps: -started last night -her rash from before completely resolved with treatments -then when in bed last night restarted and woke with itchy scattered bumps, occ feels like something biting her in her bed - but not of sofa downstairs -denies: fevers, malaise, chills -lives in apartment building  Dysuria: -started: about 1 week -symptoms: frequency -denies: fevers, flank pain, chills, nausea, abd or pelvic pain   ROS: See pertinent positives and negatives per HPI.  Past Medical History  Diagnosis Date  . Diabetes mellitus type II   . Hyperlipidemia   . Hypertension   . History of depression   . DJD (degenerative joint disease)   . Low back pain   . History of MRSA infection     recurrent carbuncle    Past Surgical History  Procedure Laterality Date  . Breast biopsy    . Appendectomy    . Abdominal hysterectomy    . Tubal ligation    . Inguinal hernia repair      Family History  Problem Relation Age of Onset  . Aneurysm Mother 63    deceased secondary to brain aneurysm  . Liver cancer Father 59    deceased  . Diabetes      grandmother    History   Social History  . Marital Status: Single    Spouse Name: N/A    Number of Children: N/A  . Years of Education: N/A   Social History Main Topics  . Smoking status: Former Research scientist (life sciences)  . Smokeless tobacco: Never Used     Comment: quit 10-11 years ago light smoker  . Alcohol Use: Yes     Comment: occ  . Drug Use: No  . Sexual Activity: Not Currently    Birth Control/ Protection: Surgical   Other Topics Concern  . None   Social History Narrative   Single   Former Smoker  -  quit 10 to 11 years ago (light smoker)   Alcohol use-yes     2 children    Occupation: [place]      Current outpatient prescriptions:  .  acetaminophen (TYLENOL) 325 MG tablet, Take 650 mg by mouth every 6 (six) hours as needed for pain (pain)., Disp: , Rfl:  .  aspirin 81 MG tablet, Take 81 mg by  mouth daily.  , Disp: , Rfl:  .  Calcium Carbonate-Vitamin D (CALTRATE 600+D) 600-400 MG-UNIT per tablet, Take 1 tablet by mouth daily.  , Disp: , Rfl:  .  CRANBERRY PO, Take by mouth., Disp: , Rfl:  .  folic acid (FOLVITE) 1 MG tablet, Take 1 mg by mouth daily.  , Disp: , Rfl:  .  Garlic 297 MG TABS, Take 1 tablet by mouth daily., Disp: , Rfl:  .  glucose blood (ONETOUCH VERIO) test strip, Use as instructed, Disp: 100 each, Rfl: 12 .  HYDROcodone-acetaminophen (NORCO/VICODIN) 5-325 MG per tablet, Take 1 tablet by mouth every 6 (six) hours as needed for pain., Disp: 15 tablet, Rfl: 0 .  lisinopril (PRINIVIL,ZESTRIL) 10 MG tablet, take 1/2 tablet by mouth once daily, Disp: 45 tablet, Rfl: 1 .  metFORMIN (GLUCOPHAGE) 1000 MG tablet, Take 1 tablet (1,000 mg total) by mouth 2 (two) times daily with a meal., Disp: 180 tablet, Rfl: 1 .  Multiple Vitamin (MULTIVITAMIN) tablet, Take 1 tablet by mouth daily.  , Disp: , Rfl:  .  Omega-3 Fatty Acids (FISH OIL) 1000 MG CAPS, Take 1,000 capsules by mouth daily.  ,  Disp: , Rfl:  .  ONETOUCH DELICA LANCETS FINE MISC, USE TO CHECK BLOOD SUGAR 3 TIMES A DAY, Disp: 100 each, Rfl: 3 .  simvastatin (ZOCOR) 40 MG tablet, Take 1 tablet (40 mg total) by mouth daily at 6 PM., Disp: 90 tablet, Rfl: 1 .  triamcinolone cream (KENALOG) 0.1 %, Apply 1 application topically 2 (two) times daily., Disp: 30 g, Rfl: 0 .  [DISCONTINUED] valsartan (DIOVAN) 160 MG tablet, Take 1/2 tablet by mouth once daily, Disp: 30 tablet, Rfl: 5  EXAM:  Filed Vitals:   12/06/14 1007  BP: 138/72  Pulse: 82  Temp: 97.7 F (36.5 C)    Body mass index is 23.83 kg/(m^2).  GENERAL: vitals reviewed and listed above, alert, oriented, appears well hydrated and in no acute distress  HEENT: atraumatic, conjunttiva clear, no obvious abnormalities on inspection of external nose and ears  NECK: no obvious masses on inspection  LUNGS: clear to auscultation bilaterally, no wheezes, rales or  rhonchi, good air movement  CV: HRRR, no peripheral edema  SKIN: few scattered erythematous papules c/w insect bites throughout, one patter on R arm with three bumps in a row  ABD: BS+, soft, NTTP  MS: moves all extremities without noticeable abnormality  PSYCH: pleasant and cooperative, no obvious depression or anxiety  ASSESSMENT AND PLAN:  Discussed the following assessment and plan:  Rash and nonspecific skin eruption  Urinary frequency  -skin rash today looks like insect bites only and does not look like scabies and c/w hx - she plans to use OTC options and discuss with her apartment manager to contact exterminator to check for fleas or bedbugs - if none found or does not resolve she plans to see dermatologist -udip pending, return precautions if persists -Patient advised to return or notify a doctor immediately if symptoms worsen or persist or new concerns arise.  Patient Instructions  BEFORE YOU LEAVE: -urine dip  Please use over the counter treatments for itching related to insect bite  Look for source of bites and treat   If not resolving after these measures please see the dermatologist     Colin Benton R.

## 2014-12-06 NOTE — Patient Instructions (Addendum)
BEFORE YOU LEAVE: -urine dip  Please use over the counter treatments for itching related to insect bite  Look for source of bites and treat   Follow up as scheduled

## 2014-12-06 NOTE — Addendum Note (Signed)
Addended by: Agnes Lawrence on: 12/06/2014 10:31 AM   Modules accepted: Orders

## 2014-12-06 NOTE — Progress Notes (Signed)
Pre visit review using our clinic review tool, if applicable. No additional management support is needed unless otherwise documented below in the visit note. 

## 2015-01-04 ENCOUNTER — Encounter: Payer: Self-pay | Admitting: Internal Medicine

## 2015-01-04 ENCOUNTER — Ambulatory Visit (INDEPENDENT_AMBULATORY_CARE_PROVIDER_SITE_OTHER): Payer: Medicare HMO | Admitting: Internal Medicine

## 2015-01-04 VITALS — BP 102/64 | HR 79 | Temp 97.7°F | Ht 63.75 in | Wt 136.4 lb

## 2015-01-04 DIAGNOSIS — I499 Cardiac arrhythmia, unspecified: Secondary | ICD-10-CM

## 2015-01-04 DIAGNOSIS — I10 Essential (primary) hypertension: Secondary | ICD-10-CM

## 2015-01-04 DIAGNOSIS — Z0000 Encounter for general adult medical examination without abnormal findings: Secondary | ICD-10-CM | POA: Insufficient documentation

## 2015-01-04 DIAGNOSIS — E119 Type 2 diabetes mellitus without complications: Secondary | ICD-10-CM | POA: Diagnosis not present

## 2015-01-04 MED ORDER — SIMVASTATIN 40 MG PO TABS: 40.0000 mg | ORAL_TABLET | Freq: Every day | ORAL | Status: DC

## 2015-01-04 MED ORDER — GLUCOSE BLOOD VI STRP: ORAL_STRIP | Status: DC

## 2015-01-04 MED ORDER — LISINOPRIL 10 MG PO TABS: 5.0000 mg | ORAL_TABLET | Freq: Every day | ORAL | Status: DC

## 2015-01-04 MED ORDER — METFORMIN HCL 1000 MG PO TABS: 1000.0000 mg | ORAL_TABLET | Freq: Two times a day (BID) | ORAL | Status: DC

## 2015-01-04 MED ORDER — ONETOUCH DELICA LANCETS FINE MISC: Status: DC

## 2015-01-04 NOTE — Patient Instructions (Signed)
Please limit your carbohydrate intake to 30 g per meal. Restart regular exercise program (walking) Please complete the following lab tests before your next follow up appointment: BMET, A1c - 250.00

## 2015-01-04 NOTE — Assessment & Plan Note (Signed)
Patient's A1c is stable. Continue same dose of metformin. Patient encouraged to limit her carbohydrates to 30 g per meal. Regular exercise also encouraged.  Lab Results  Component Value Date   HGBA1C 6.6* 10/26/2014

## 2015-01-04 NOTE — Assessment & Plan Note (Signed)
BP at goal.  Continue same dose of lisinopril. BP: 102/64 mmHg   EKG performed due to irregular heart beat.  It was notable for PVCs only.  Patient advised to decrease caffeine intake.

## 2015-01-04 NOTE — Progress Notes (Signed)
Pre visit review using our clinic review tool, if applicable. No additional management support is needed unless otherwise documented below in the visit note. 

## 2015-01-04 NOTE — Assessment & Plan Note (Signed)
Medicare wellness questionnaire reviewed in detail. See attached form. Screening for substance abuse negative. She exercises on a regular basis. She is not sexually active. She has no difficulty with activities of daily living. She is not afraid of falling and does not have any gait instability. No hearing loss. Screening for depression negative. Limited memory loss screening also unremarkable. Patient able to recall 3 objects and perform simple calculations.  Screening bone density scan to be scheduled. Patient to inquire with her insurance company regarding coverage for Zostavax.  She is uptodate with diabetic eye exam.

## 2015-01-04 NOTE — Progress Notes (Signed)
Subjective:    Patient ID: Cathy [name], female    DOB: [birth year], 69 y.o.   MRN: [id number]  HPI  69 year old African American female with type 2 diabetes controlled, hypertension and hyperlipidemia presents for Medicare wellness exam.  Medicare wellness exam questionnaire reviewed in detail. Patient denies any hospitalizations or visits to the emergency room within the past year. Patient is not followed by any other subspecialists. Her last diabetic eye exam was completed in the fall of 2015 by Dr. Satira Sark at [facility] ophthalmology. She does not have glaucoma nor does she wear corrective lenses. She has not had any surgeries within the last year.  See attached form for additional details.  Type 2 diabetes-her blood sugars are stable. Patient denies any lower extremity paresthesias. Patient tolerating metformin.  Hyperlipidemia good compliance with simvastatin.  Her LDL is not at goal.   Review of Systems Negative for chest pain, negative for numbness or tingling in hands or feet    Past Medical History  Diagnosis Date  . Diabetes mellitus type II   . Hyperlipidemia   . Hypertension   . History of depression   . DJD (degenerative joint disease)   . Low back pain   . History of MRSA infection     recurrent carbuncle    History   Social History  . Marital Status: Single    Spouse Name: N/A  . Number of Children: N/A  . Years of Education: N/A   Occupational History  . Not on file.   Social History Main Topics  . Smoking status: Former Research scientist (life sciences)  . Smokeless tobacco: Never Used     Comment: quit 10-11 years ago light smoker  . Alcohol Use: Yes     Comment: occ  . Drug Use: No  . Sexual Activity: Not Currently    Birth Control/ Protection: Surgical   Other Topics Concern  . Not on file   Social History Narrative   Single   Former Smoker  -  quit 10 to 11 years ago (light smoker)   Alcohol use-yes     2 children    Occupation: [employer]      Past Surgical History  Procedure Laterality Date  . Breast biopsy    . Appendectomy    . Abdominal hysterectomy    . Tubal ligation    . Inguinal hernia repair      Family History  Problem Relation Age of Onset  . Aneurysm Mother 31    deceased secondary to brain aneurysm  . Liver cancer Father 66    deceased  . Diabetes      grandmother    Allergies  Allergen Reactions  . Penicillins     REACTION: urticaria (hives)    Current Outpatient Prescriptions on File Prior to Visit  Medication Sig Dispense Refill  . acetaminophen (TYLENOL) 325 MG tablet Take 650 mg by mouth every 6 (six) hours as needed for pain (pain).    [name] aspirin 81 MG tablet Take 81 mg by mouth daily.      . Calcium Carbonate-Vitamin D (CALTRATE 600+D) 600-400 MG-UNIT per tablet Take 1 tablet by mouth daily.      [name] CRANBERRY PO Take by mouth.    . folic acid (FOLVITE) 1 MG tablet Take 1 mg by mouth daily.      . Garlic 856 MG TABS Take 1 tablet by mouth daily.    [name] glucose blood (ONETOUCH VERIO) test strip Use as  instructed 100 each 12  . HYDROcodone-acetaminophen (NORCO/VICODIN) 5-325 MG per tablet Take 1 tablet by mouth every 6 (six) hours as needed for pain. 15 tablet 0  . lisinopril (PRINIVIL,ZESTRIL) 10 MG tablet take 1/2 tablet by mouth once daily 45 tablet 1  . metFORMIN (GLUCOPHAGE) 1000 MG tablet Take 1 tablet (1,000 mg total) by mouth 2 (two) times daily with a meal. 180 tablet 1  . Multiple Vitamin (MULTIVITAMIN) tablet Take 1 tablet by mouth daily.      . Omega-3 Fatty Acids (FISH OIL) 1000 MG CAPS Take 1,000 capsules by mouth daily.      Glory Rosebush DELICA LANCETS FINE MISC USE TO CHECK BLOOD SUGAR 3 TIMES A DAY 100 each 3  . simvastatin (ZOCOR) 40 MG tablet Take 1 tablet (40 mg total) by mouth daily at 6 PM. 90 tablet 1  . [DISCONTINUED] valsartan (DIOVAN) 160 MG tablet Take 1/2 tablet by mouth once daily 30 tablet 5   No current facility-administered medications on file prior to visit.     BP 102/64 mmHg  Pulse 79  Temp(Src) 97.7 F (36.5 C) (Oral)  Ht 5' 3.75" (1.619 m)  Wt 136 lb 6.4 oz (61.871 kg)  BMI 23.60 kg/m2    Objective:   Physical Exam  Constitutional: She is oriented to person, place, and time. She appears well-developed and well-nourished. No distress.  HENT:  Head: Normocephalic and atraumatic.  Right Ear: External ear normal.  Left Ear: External ear normal.  Eyes: Pupils are equal, round, and reactive to light.  Neck: Neck supple.  No carotid bruit  Cardiovascular: Normal rate and normal heart sounds.   No murmur heard. Pulmonary/Chest: Effort normal and breath sounds normal. She has no wheezes.  Abdominal: Soft. Bowel sounds are normal. There is no tenderness.  Musculoskeletal: She exhibits no edema.  Lymphadenopathy:    She has no cervical adenopathy.  Neurological: She is alert and oriented to person, place, and time. No cranial nerve deficit.  Skin: Skin is warm and dry.  Psychiatric: She has a normal mood and affect. Her behavior is normal.          Assessment & Plan:

## 2015-02-15 ENCOUNTER — Emergency Department (HOSPITAL_COMMUNITY)
Admission: EM | Admit: 2015-02-15 | Discharge: 2015-02-15 | Disposition: A | Discharge status: Home / Self Care | Payer: Medicare HMO | Attending: Emergency Medicine | Admitting: Emergency Medicine

## 2015-02-15 ENCOUNTER — Encounter (HOSPITAL_COMMUNITY): Payer: Self-pay | Admitting: Emergency Medicine

## 2015-02-15 ENCOUNTER — Emergency Department (HOSPITAL_COMMUNITY): Payer: Medicare HMO

## 2015-02-15 DIAGNOSIS — Z880 Allergy status to penicillin: Secondary | ICD-10-CM | POA: Insufficient documentation

## 2015-02-15 DIAGNOSIS — R079 Chest pain, unspecified: Secondary | ICD-10-CM | POA: Diagnosis not present

## 2015-02-15 DIAGNOSIS — Z8739 Personal history of other diseases of the musculoskeletal system and connective tissue: Secondary | ICD-10-CM | POA: Insufficient documentation

## 2015-02-15 DIAGNOSIS — Z79899 Other long term (current) drug therapy: Secondary | ICD-10-CM | POA: Diagnosis not present

## 2015-02-15 DIAGNOSIS — Z87891 Personal history of nicotine dependence: Secondary | ICD-10-CM | POA: Diagnosis not present

## 2015-02-15 DIAGNOSIS — E119 Type 2 diabetes mellitus without complications: Secondary | ICD-10-CM | POA: Diagnosis not present

## 2015-02-15 DIAGNOSIS — Z8659 Personal history of other mental and behavioral disorders: Secondary | ICD-10-CM | POA: Insufficient documentation

## 2015-02-15 DIAGNOSIS — Z7982 Long term (current) use of aspirin: Secondary | ICD-10-CM | POA: Insufficient documentation

## 2015-02-15 DIAGNOSIS — Z8614 Personal history of Methicillin resistant Staphylococcus aureus infection: Secondary | ICD-10-CM | POA: Insufficient documentation

## 2015-02-15 DIAGNOSIS — I10 Essential (primary) hypertension: Secondary | ICD-10-CM | POA: Insufficient documentation

## 2015-02-15 LAB — BASIC METABOLIC PANEL
ANION GAP: 9 (ref 5–15)
BUN: 13 mg/dL (ref 6–23)
CO2: 27 mmol/L (ref 19–32)
CREATININE: 0.72 mg/dL (ref 0.50–1.10)
Calcium: 9.7 mg/dL (ref 8.4–10.5)
Chloride: 103 mmol/L (ref 96–112)
GFR calc non Af Amer: 88 mL/min — ABNORMAL LOW (ref >90)
Glucose, Bld: 98 mg/dL (ref 70–99)
Potassium: 3.9 mmol/L (ref 3.5–5.1)
Sodium: 139 mmol/L (ref 135–145)

## 2015-02-15 LAB — CBC
HCT: 39.2 % (ref 36.0–46.0)
Hemoglobin: 12.4 g/dL (ref 12.0–15.0)
MCH: 24.9 pg — ABNORMAL LOW (ref 26.0–34.0)
MCHC: 31.6 g/dL (ref 30.0–36.0)
MCV: 78.7 fL (ref 78.0–100.0)
PLATELETS: 280 10*3/uL (ref 150–400)
RBC: 4.98 MIL/uL (ref 3.87–5.11)
RDW: 14.5 % (ref 11.5–15.5)
WBC: 9.8 10*3/uL (ref 4.0–10.5)

## 2015-02-15 LAB — URINALYSIS, ROUTINE W REFLEX MICROSCOPIC
BILIRUBIN URINE: NEGATIVE
Glucose, UA: NEGATIVE mg/dL
Hgb urine dipstick: NEGATIVE
KETONES UR: NEGATIVE mg/dL
Nitrite: NEGATIVE
PH: 6 (ref 5.0–8.0)
Protein, ur: NEGATIVE mg/dL
SPECIFIC GRAVITY, URINE: 1.015 (ref 1.005–1.030)
Urobilinogen, UA: 0.2 mg/dL (ref 0.0–1.0)

## 2015-02-15 LAB — I-STAT TROPONIN, ED
Troponin i, poc: 0 ng/mL (ref 0.00–0.08)
Troponin i, poc: 0 ng/mL (ref 0.00–0.08)

## 2015-02-15 LAB — URINE MICROSCOPIC-ADD ON

## 2015-02-15 MED ORDER — GI COCKTAIL ~~LOC~~
30.0000 mL | Freq: Once | ORAL | Status: AC
  Administered 2015-02-15: 30 mL via ORAL
  Filled 2015-02-15: qty 30

## 2015-02-15 MED ORDER — ASPIRIN 325 MG PO TABS
325.0000 mg | ORAL_TABLET | Freq: Once | ORAL | Status: AC
  Administered 2015-02-15: 325 mg via ORAL
  Filled 2015-02-15: qty 1

## 2015-02-15 NOTE — ED Provider Notes (Signed)
CSN: [id number]     Arrival date & time 02/15/15  1643 History   First MD Initiated Contact with Patient 02/15/15 1824     Chief Complaint  Patient presents with  . Chest Pain     (Consider location/radiation/quality/duration/timing/severity/associated sxs/prior Treatment) Patient is a 69 y.o. female presenting with chest pain.  Chest Pain Pain location:  Substernal area Pain quality: pressure and tightness   Pain radiates to:  Does not radiate Pain radiates to the back: no   Pain severity:  Moderate Onset quality:  Gradual Duration:  5 days Timing:  Constant Progression:  Worsening Chronicity:  New Context: stress   Relieved by:  Nothing Worsened by:  Exertion Ineffective treatments:  None tried Associated symptoms: cough   Associated symptoms: no abdominal pain, no nausea, no shortness of breath and not vomiting     Past Medical History  Diagnosis Date  . Diabetes mellitus type II   . Hyperlipidemia   . Hypertension   . History of depression   . DJD (degenerative joint disease)   . Low back pain   . History of MRSA infection     recurrent carbuncle   Past Surgical History  Procedure Laterality Date  . Breast biopsy    . Appendectomy    . Abdominal hysterectomy    . Tubal ligation    . Inguinal hernia repair     Family History  Problem Relation Age of Onset  . Aneurysm Mother 104    deceased secondary to brain aneurysm  . Liver cancer Father 90    deceased  . Diabetes      grandmother   History  Substance Use Topics  . Smoking status: Former Research scientist (life sciences)  . Smokeless tobacco: Never Used     Comment: quit 10-11 years ago light smoker  . Alcohol Use: Yes     Comment: occ   OB History    Gravida Para Term Preterm AB TAB SAB Ectopic Multiple Living   3 2 2  1  1   2      Review of Systems  Respiratory: Positive for cough. Negative for shortness of breath.   Cardiovascular: Positive for chest pain.  Gastrointestinal: Negative for nausea, vomiting and  abdominal pain.  All other systems reviewed and are negative.     Allergies  Penicillins  Home Medications   Prior to Admission medications   Medication Sig Start Date End Date Taking? Authorizing Provider  acetaminophen (TYLENOL) 325 MG tablet Take 650 mg by mouth every 6 (six) hours as needed for pain (pain).   Yes Historical Provider, MD  aspirin 81 MG tablet Take 81 mg by mouth daily.     Yes Historical Provider, MD  Calcium Carbonate-Vitamin D (CALTRATE 600+D) 600-400 MG-UNIT per tablet Take 1 tablet by mouth daily.     Yes Historical Provider, MD  CRANBERRY PO Take by mouth.   Yes Historical Provider, MD  folic acid (FOLVITE) 1 MG tablet Take 1 mg by mouth daily.     Yes Historical Provider, MD  glucose blood (ONETOUCH VERIO) test strip Use as instructed 01/04/15  Yes Doe-Hyun R Shawna Orleans, DO  lisinopril (PRINIVIL,ZESTRIL) 10 MG tablet Take 0.5 tablets (5 mg total) by mouth daily. 01/04/15  Yes Doe-Hyun R Shawna Orleans, DO  metFORMIN (GLUCOPHAGE) 1000 MG tablet Take 1 tablet (1,000 mg total) by mouth 2 (two) times daily with a meal. 01/04/15  Yes Doe-Hyun Kyra Searles, DO  Multiple Vitamin (MULTIVITAMIN) tablet Take 1 tablet by mouth daily.  Yes Historical Provider, MD  NON FORMULARY Tumeric   Yes Historical Provider, MD  Omega-3 Fatty Acids (FISH OIL) 1000 MG CAPS Take 1,000 capsules by mouth daily.     Yes Historical Provider, MD  [facility] DELICA LANCETS FINE MISC USE TO CHECK BLOOD SUGAR ONCE DAILY 01/04/15  Yes Doe-Hyun Kyra Searles, DO  simvastatin (ZOCOR) 40 MG tablet Take 1 tablet (40 mg total) by mouth daily at 6 PM. 01/04/15  Yes Doe-Hyun R Shawna Orleans, DO  Garlic 979 MG TABS Take 1 tablet by mouth daily.    Historical Provider, MD  HYDROcodone-acetaminophen (NORCO/VICODIN) 5-325 MG per tablet Take 1 tablet by mouth every 6 (six) hours as needed for pain. Patient not taking: Reported on 02/15/2015 09/13/13   Dalia Heading, PA-C   BP 117/69 mmHg  Pulse 80  Temp(Src) 97.9 F (36.6 C) (Oral)  Resp 16  Ht  5\' 4"  (1.626 m)  Wt 138 lb (62.596 kg)  BMI 23.68 kg/m2  SpO2 99% Physical Exam  Constitutional: She is oriented to person, place, and time. She appears well-developed and well-nourished.  HENT:  Head: Normocephalic and atraumatic.  Right Ear: External ear normal.  Left Ear: External ear normal.  Eyes: Conjunctivae and EOM are normal. Pupils are equal, round, and reactive to light.  Neck: Normal range of motion. Neck supple.  Cardiovascular: Normal rate, regular rhythm, normal heart sounds and intact distal pulses.   Pulmonary/Chest: Effort normal and breath sounds normal.  Abdominal: Soft. Bowel sounds are normal. There is no tenderness.  Musculoskeletal: Normal range of motion.  Neurological: She is alert and oriented to person, place, and time.  Skin: Skin is warm and dry.  Vitals reviewed.   ED Course  Procedures (including critical care time) Labs Review Labs Reviewed  CBC - Abnormal; Notable for the following:    MCH 24.9 (*)    All other components within normal limits  BASIC METABOLIC PANEL - Abnormal; Notable for the following:    GFR calc non Af Amer 88 (*)    All other components within normal limits  URINALYSIS, ROUTINE W REFLEX MICROSCOPIC - Abnormal; Notable for the following:    Leukocytes, UA SMALL (*)    All other components within normal limits  URINE MICROSCOPIC-ADD ON  Randolm Idol, ED  Randolm Idol, ED    Imaging Review Dg Chest 2 View  02/15/2015   CLINICAL DATA:  Chest pain for 3 days.  EXAM: CHEST  2 VIEW  COMPARISON:  07/20/2009  FINDINGS: The heart size and mediastinal contours are within normal limits. Both lungs are clear. The visualized skeletal structures are unremarkable.  IMPRESSION: No active cardiopulmonary disease.   Electronically Signed   By: Rolm Baptise M.D.   On: 02/15/2015 18:02     EKG Interpretation   Date/Time:  Friday February 15 2015 17:00:31 EDT Ventricular Rate:  79 PR Interval:  123 QRS Duration: 96 QT Interval:   366 QTC Calculation: 419 R Axis:   82 Text Interpretation:  Sinus rhythm Ventricular premature complex  Borderline right axis deviation Low voltage, precordial leads No  significant change since last tracing Confirmed by Debby Freiberg [phone])  on 02/15/2015 6:33:18 PM      MDM   Final diagnoses:  Chest pain, unspecified chest pain type    69 y.o. female with pertinent PMH of DM, HTN, HLD presents with chest pain as above.  Symptoms concerning for potential angina, however also with prominent gastric component, worse with carbonated drinks.  She also has  pain with chest palpation.  Physical exam today as above.  Trop negative, no new symptoms in 6 hours.  EKG unchanged.  No active pain on my examination  DC home with standard return precautions for chest pain and cardiology fu.    I have reviewed all laboratory and imaging studies if ordered as above  1. Chest pain, unspecified chest pain type   2. Chest pain         Debby Freiberg, MD 02/15/15 2006

## 2015-02-15 NOTE — ED Notes (Signed)
Pt from home c/o central chest heaviness x 2 days. She reports this pain is painful when touched. She reports drinking pepsi and having lots of gas. Denies shortness of breath or nausea.

## 2015-02-15 NOTE — Discharge Instructions (Signed)

## 2015-03-01 ENCOUNTER — Ambulatory Visit: Payer: Commercial Managed Care - HMO | Admitting: *Deleted

## 2015-03-07 ENCOUNTER — Ambulatory Visit (INDEPENDENT_AMBULATORY_CARE_PROVIDER_SITE_OTHER): Payer: Medicare HMO | Admitting: *Deleted

## 2015-03-07 DIAGNOSIS — Z23 Encounter for immunization: Secondary | ICD-10-CM | POA: Diagnosis not present

## 2015-03-22 ENCOUNTER — Encounter: Payer: Self-pay | Admitting: Gastroenterology

## 2015-03-27 ENCOUNTER — Encounter: Payer: Self-pay | Admitting: Internal Medicine

## 2015-03-27 ENCOUNTER — Ambulatory Visit (INDEPENDENT_AMBULATORY_CARE_PROVIDER_SITE_OTHER): Payer: Medicare HMO | Admitting: Internal Medicine

## 2015-03-27 VITALS — BP 120/72 | HR 80 | Ht 64.0 in | Wt 138.4 lb

## 2015-03-27 DIAGNOSIS — I10 Essential (primary) hypertension: Secondary | ICD-10-CM

## 2015-03-27 DIAGNOSIS — E119 Type 2 diabetes mellitus without complications: Secondary | ICD-10-CM

## 2015-03-27 DIAGNOSIS — K219 Gastro-esophageal reflux disease without esophagitis: Secondary | ICD-10-CM

## 2015-03-27 DIAGNOSIS — E785 Hyperlipidemia, unspecified: Secondary | ICD-10-CM

## 2015-03-27 NOTE — Progress Notes (Signed)
OFFICE NOTE  Chief Complaint:  ER follow-up, chest pain  Primary Care Physician: Drema Pry, DO  HPI:  Cathy [name] is a pleasant 69 year old female who was recently seen in emergency department for chest pain. Recently she's been under significant stress and was having some upper midepigastric discomfort and pressure. She was also having a lot of gas and belching. She took Alka-Seltzer and had some relief of her symptoms. She was seen in the emergency room and ruled out for MI. Based on her risk factors including type 2 diabetes, dyslipidemia and hypertension as well as a family history of vascular disease in her mother, she was referred for cardiology follow-up. She reports that she started taking Prilosec and completed a two-week course and has had no further symptoms. She denies any chest pain or shortness of breath with exertion. She walks several times a week and does exercises almost every day including pushups, sit-ups and other aerobic activities. She works part-time at [employer] and does do some heavy lifting of groceries.  PMHx:  Past Medical History  Diagnosis Date  . Diabetes mellitus type II   . Hyperlipidemia   . Hypertension   . History of depression   . DJD (degenerative joint disease)   . Low back pain   . History of MRSA infection     recurrent carbuncle    Past Surgical History  Procedure Laterality Date  . Breast biopsy    . Appendectomy    . Abdominal hysterectomy    . Tubal ligation    . Inguinal hernia repair      FAMHx:  Family History  Problem Relation Age of Onset  . Aneurysm Mother 29    deceased secondary to brain aneurysm  . Liver cancer Father 84    deceased  . Diabetes      grandmother    SOCHx:   reports that she has quit smoking. She has never used smokeless tobacco. She reports that she drinks alcohol. She reports that she does not use illicit drugs.  ALLERGIES:  Allergies  Allergen Reactions  . Penicillins     REACTION:  urticaria (hives)    ROS: A comprehensive review of systems was negative except for: Gastrointestinal: positive for dyspepsia and reflux symptoms  HOME MEDS: Current Outpatient Prescriptions  Medication Sig Dispense Refill  . acetaminophen (TYLENOL) 325 MG tablet Take 650 mg by mouth every 6 (six) hours as needed for pain (pain).    [name] aspirin 81 MG tablet Take 81 mg by mouth daily.      . Calcium Carbonate-Vitamin D (CALTRATE 600+D) 600-400 MG-UNIT per tablet Take 1 tablet by mouth daily.      [name] CRANBERRY PO Take by mouth.    . folic acid (FOLVITE) 1 MG tablet Take 1 mg by mouth daily.      . Garlic 631 MG TABS Take 1 tablet by mouth daily.    [name] glucose blood (ONETOUCH VERIO) test strip Use as instructed 100 each 12  . HYDROcodone-acetaminophen (NORCO/VICODIN) 5-325 MG per tablet Take 1 tablet by mouth every 6 (six) hours as needed for pain. 15 tablet 0  . lisinopril (PRINIVIL,ZESTRIL) 10 MG tablet Take 0.5 tablets (5 mg total) by mouth daily. 45 tablet 1  . metFORMIN (GLUCOPHAGE) 1000 MG tablet Take 1 tablet (1,000 mg total) by mouth 2 (two) times daily with a meal. 180 tablet 1  . Multiple Vitamin (MULTIVITAMIN) tablet Take 1 tablet by mouth daily.      [name]  NON FORMULARY Tumeric    . Omega-3 Fatty Acids (FISH OIL) 1000 MG CAPS Take 1,000 capsules by mouth daily.      Glory Rosebush DELICA LANCETS FINE MISC USE TO CHECK BLOOD SUGAR ONCE DAILY 100 each 3  . simvastatin (ZOCOR) 40 MG tablet Take 1 tablet (40 mg total) by mouth daily at 6 PM. 90 tablet 1  . [DISCONTINUED] valsartan (DIOVAN) 160 MG tablet Take 1/2 tablet by mouth once daily 30 tablet 5   No current facility-administered medications for this visit.    LABS/IMAGING: No results found for this or any previous visit (from the past 48 hour(s)). No results found.  WEIGHTS: Wt Readings from Last 3 Encounters:  03/27/15 138 lb 6.4 oz (62.778 kg)  02/15/15 138 lb (62.596 kg)  01/04/15 136 lb 6.4 oz (61.871 kg)    VITALS: BP  120/72 mmHg  Pulse 80  Ht 5\' 4"  (1.626 m)  Wt 138 lb 6.4 oz (62.778 kg)  BMI 23.74 kg/m2  EXAM: General appearance: alert and no distress Neck: no carotid bruit and no JVD Lungs: clear to auscultation bilaterally Heart: regular rate and rhythm, S1, S2 normal, no murmur, click, rub or gallop Abdomen: soft, non-tender; bowel sounds normal; no masses,  no organomegaly Extremities: extremities normal, atraumatic, no cyanosis or edema Pulses: 2+ and symmetric Skin: Skin color, texture, turgor normal. No rashes or lesions Neurologic: Grossly normal Psych: Normal  EKG: Deferred  ASSESSMENT: 1. Gastritis/GERD 2. Dyslipidemia 3. Hypertension 4. Type 2 diabetes, controlled  PLAN: 1.   Mrs. Mccallum has what sounds like gastritis and/or GERD. She had belching and this was relieved with Alka-Seltzer. Her chest discomfort sounded very atypical. She's not had any further symptoms after completing a two-week course of Prilosec. She is physically active and exercises several times a week without any limitation to her symptoms. Her risk factors are being adequately treated. I do not feel that she needs further cardiac testing at this time. I'm happy to see her back as needed should a cardiac symptoms arise.  Thanks for allowing me to participate in her care.  Pixie Casino, MD, [facility] Attending Cardiologist [place] 03/27/2015, 8:48 AM

## 2015-03-27 NOTE — Patient Instructions (Signed)
Your physician recommends that you schedule a follow-up appointment as needed  

## 2015-03-29 ENCOUNTER — Encounter: Payer: Self-pay | Admitting: Gastroenterology

## 2015-04-15 ENCOUNTER — Telehealth: Payer: Self-pay | Admitting: Internal Medicine

## 2015-04-15 NOTE — Telephone Encounter (Signed)
Pt would like to go to [facility] for UA. Pt is urinating a lot and does not have gas to see md at [facility]. Pt thinks she has uti

## 2015-04-15 NOTE — Telephone Encounter (Signed)
Can not order u/a without being seen.  Offered an appt to see Dr Doug Sou over at [facility] today at 3:15.  She declined stating her car was in the shop.  Offered her an appt with Dr Doug Sou tomorrow.  Pt refuse.  She stated she will call back

## 2015-05-31 ENCOUNTER — Encounter: Payer: Self-pay | Admitting: Gastroenterology

## 2015-06-05 ENCOUNTER — Ambulatory Visit (AMBULATORY_SURGERY_CENTER): Payer: Self-pay

## 2015-06-05 VITALS — Ht 63.0 in | Wt 138.0 lb

## 2015-06-05 DIAGNOSIS — Z1211 Encounter for screening for malignant neoplasm of colon: Secondary | ICD-10-CM

## 2015-06-05 MED ORDER — SUPREP BOWEL PREP KIT 17.5-3.13-1.6 GM/177ML PO SOLN: 1.0000 | Freq: Once | ORAL | Status: DC

## 2015-06-05 NOTE — Progress Notes (Signed)
No allergies to eggs or soy No diet/weight loss meds No home oxygen No past problems with anesthesia  Does not want to see emmi video; has had procedure before

## 2015-06-18 ENCOUNTER — Encounter: Payer: Self-pay | Admitting: Gastroenterology

## 2015-06-18 ENCOUNTER — Ambulatory Visit (AMBULATORY_SURGERY_CENTER): Payer: Medicare HMO | Admitting: Gastroenterology

## 2015-06-18 VITALS — BP 122/89 | HR 62 | Temp 97.1°F | Resp 12 | Ht 63.0 in | Wt 138.0 lb

## 2015-06-18 DIAGNOSIS — K648 Other hemorrhoids: Secondary | ICD-10-CM

## 2015-06-18 DIAGNOSIS — E119 Type 2 diabetes mellitus without complications: Secondary | ICD-10-CM | POA: Diagnosis not present

## 2015-06-18 DIAGNOSIS — Z1211 Encounter for screening for malignant neoplasm of colon: Secondary | ICD-10-CM | POA: Diagnosis present

## 2015-06-18 DIAGNOSIS — I10 Essential (primary) hypertension: Secondary | ICD-10-CM | POA: Diagnosis not present

## 2015-06-18 DIAGNOSIS — M545 Low back pain: Secondary | ICD-10-CM | POA: Diagnosis not present

## 2015-06-18 LAB — GLUCOSE, CAPILLARY
Glucose-Capillary: 91 mg/dL (ref 65–99)
Glucose-Capillary: 95 mg/dL (ref 65–99)

## 2015-06-18 MED ORDER — SODIUM CHLORIDE 0.9 % IV SOLN: 500.0000 mL | INTRAVENOUS | Status: DC

## 2015-06-18 NOTE — Op Note (Signed)
[place]  [address]. [place], [id number]   COLONOSCOPY PROCEDURE REPORT  PATIENT: Cathy [name], Cathy [name]  MR#: [id number] BIRTHDATE: 07-13-1946 , 64  yrs. old GENDER: female ENDOSCOPIST: Inda Castle, MD REFERRED BY: PROCEDURE DATE:  06/18/2015 PROCEDURE:   Colonoscopy, screening First Screening Colonoscopy - Avg.  risk and is 50 yrs.  old or older - No.  Prior Negative Screening - Now for repeat screening. 10 or more years since last screening  History of Adenoma - Now for follow-up colonoscopy & has been > or = to 3 yrs.  N/A  Polyps removed today? No Recommend repeat exam, <10 yrs? No ASA CLASS:   Class II INDICATIONS:Colorectal Neoplasm Risk Assessment for this procedure is average risk. MEDICATIONS: Monitored anesthesia care and Propofol 200 mg IV  DESCRIPTION OF PROCEDURE:   After the risks benefits and alternatives of the procedure were thoroughly explained, informed consent was obtained.  The digital rectal exam revealed no abnormalities of the rectum.   The [id number]  endoscope was introduced through the anus and advanced to the cecum, which was identified by both the appendix and ileocecal valve. No adverse events experienced.   The quality of the prep was (Suprep was used) fair.  The instrument was then slowly withdrawn as the colon was fully examined. Estimated blood loss is zero unless otherwise noted in this procedure report.      COLON FINDINGS: Internal hemorrhoids were found.   The examination was otherwise normal.  Retroflexed views revealed no abnormalities. The time to cecum = 8.8 Withdrawal time = 0.6   The scope was withdrawn and the procedure completed. COMPLICATIONS: There were no immediate complications.  ENDOSCOPIC IMPRESSION: 1.   Internal hemorrhoids 2.   The examination was otherwise normal  RECOMMENDATIONS: Continue current colorectal screening recommendations for "routine risk" patients with a repeat  colonoscopy in 10 years.  eSigned:  Inda Castle, MD 06/18/2015 9:11 AM   cc: Alyson Ingles, MD

## 2015-06-18 NOTE — Progress Notes (Signed)
Report to PACU, RN, vss, BBS= Clear.  

## 2015-06-18 NOTE — Patient Instructions (Signed)
YOU HAD AN ENDOSCOPIC PROCEDURE TODAY AT THE [facility]:   Refer to the procedure report that was given to you for any specific questions about what was found during the examination.  If the procedure report does not answer your questions, please call your gastroenterologist to clarify.  If you requested that your care partner not be given the details of your procedure findings, then the procedure report has been included in a sealed envelope for you to review at your convenience later.  YOU SHOULD EXPECT: Some feelings of bloating in the abdomen. Passage of more gas than usual.  Walking can help get rid of the air that was put into your GI tract during the procedure and reduce the bloating. If you had a lower endoscopy (such as a colonoscopy or flexible sigmoidoscopy) you may notice spotting of blood in your stool or on the toilet paper. If you underwent a bowel prep for your procedure, you may not have a normal bowel movement for a few days.  Please Note:  You might notice some irritation and congestion in your nose or some drainage.  This is from the oxygen used during your procedure.  There is no need for concern and it should clear up in a day or so.  SYMPTOMS TO REPORT IMMEDIATELY:   Following lower endoscopy (colonoscopy or flexible sigmoidoscopy):  Excessive amounts of blood in the stool  Significant tenderness or worsening of abdominal pains  Swelling of the abdomen that is new, acute  Fever of 100F or higher    For urgent or emergent issues, a gastroenterologist can be reached at any hour by calling [phone].   DIET: Your first meal following the procedure should be a small meal and then it is ok to progress to your normal diet. Heavy or fried foods are harder to digest and may make you feel nauseous or bloated.  Likewise, meals heavy in dairy and vegetables can increase bloating.  Drink plenty of fluids but you should avoid alcoholic beverages for 24  hours.  ACTIVITY:  You should plan to take it easy for the rest of today and you should NOT DRIVE or use heavy machinery until tomorrow (because of the sedation medicines used during the test).    FOLLOW UP: Our staff will call the number listed on your records the next business day following your procedure to check on you and address any questions or concerns that you may have regarding the information given to you following your procedure. If we do not reach you, we will leave a message.  However, if you are feeling well and you are not experiencing any problems, there is no need to return our call.  We will assume that you have returned to your regular daily activities without incident.  If any biopsies were taken you will be contacted by phone or by letter within the next 1-3 weeks.  Please call us at [phone] if you have not heard about the biopsies in 3 weeks.    SIGNATURES/CONFIDENTIALITY: You and/or your care partner have signed paperwork which will be entered into your electronic medical record.  These signatures attest to the fact that that the information above on your After Visit Summary has been reviewed and is understood.  Full responsibility of the confidentiality of this discharge information lies with you and/or your care-partner.   Resume medications. Information given on hemorrhoids and high fiber diet. 

## 2015-06-19 ENCOUNTER — Telehealth: Payer: Self-pay | Admitting: *Deleted

## 2015-06-19 NOTE — Telephone Encounter (Signed)
  Follow up Call-  Call back number 06/18/2015  Post procedure Call Back phone  # [phone] hm  Permission to leave phone message Yes     Patient questions:  Do you have a fever, pain , or abdominal swelling? No. Pain Score  0 *  Have you tolerated food without any problems? Yes.    Have you been able to return to your normal activities? Yes.    Do you have any questions about your discharge instructions: Diet   No. Medications  No. Follow up visit  No.  Do you have questions or concerns about your Care? No.  Actions: * If pain score is 4 or above: No action needed, pain <4.

## 2015-07-26 ENCOUNTER — Other Ambulatory Visit: Payer: Self-pay

## 2015-07-26 MED ORDER — METFORMIN HCL 1000 MG PO TABS: 1000.0000 mg | ORAL_TABLET | Freq: Two times a day (BID) | ORAL | Status: DC

## 2015-07-26 NOTE — Telephone Encounter (Signed)
Rx reqeust for metformin HCL 1000 mg #60  Pharm:  [facility]  Pls advise.

## 2015-08-05 ENCOUNTER — Ambulatory Visit (INDEPENDENT_AMBULATORY_CARE_PROVIDER_SITE_OTHER): Payer: Commercial Managed Care - HMO | Admitting: Family Medicine

## 2015-08-05 VITALS — BP 118/70

## 2015-08-05 DIAGNOSIS — R300 Dysuria: Secondary | ICD-10-CM | POA: Diagnosis not present

## 2015-08-05 LAB — POCT URINALYSIS DIPSTICK
Bilirubin, UA: NEGATIVE
Glucose, UA: NEGATIVE
Ketones, UA: NEGATIVE
Nitrite, UA: POSITIVE
PH UA: 6
Spec Grav, UA: >=1.03
UROBILINOGEN UA: 0.2

## 2015-08-05 MED ORDER — NITROFURANTOIN MONOHYD MACRO 100 MG PO CAPS: 100.0000 mg | ORAL_CAPSULE | Freq: Two times a day (BID) | ORAL | Status: DC

## 2015-08-05 MED ORDER — METFORMIN HCL 1000 MG PO TABS: 1000.0000 mg | ORAL_TABLET | Freq: Two times a day (BID) | ORAL | Status: DC

## 2015-08-05 NOTE — Addendum Note (Signed)
Addended by: Agnes Lawrence on: 08/05/2015 04:02 PM   Modules accepted: Orders

## 2015-08-05 NOTE — Patient Instructions (Addendum)
BEFORE YOU LEAVE: -schedule follow up with your doctor or to transfer care to [facility] or Dr. Yong Channel -see if she wants her flu shot   -follow up if any symptoms persist

## 2015-08-05 NOTE — Progress Notes (Signed)
HPI:  Acute visit for:  Dysuria/Vulvovag pruritis: -getting married and had intercourse 1 week ago -mild vaginal discomfort after -mild dysuria the last few days -also saw some blood when urinated 2 days ago, now resolved -denies: flank pain, fevers, NVD, vag discharge, concern for STI  ROS: See pertinent positives and negatives per HPI.  Past Medical History  Diagnosis Date  . Diabetes mellitus type II   . Hyperlipidemia   . Hypertension   . History of depression   . DJD (degenerative joint disease)   . Low back pain   . History of MRSA infection     recurrent carbuncle  . GERD (gastroesophageal reflux disease)   . Depression   . Allergy     Past Surgical History  Procedure Laterality Date  . Breast biopsy    . Appendectomy    . Abdominal hysterectomy    . Tubal ligation    . Inguinal hernia repair    . Colonoscopy      Family History  Problem Relation Age of Onset  . Aneurysm Mother 53    deceased secondary to brain aneurysm  . Liver cancer Father 54    deceased  . Diabetes      grandmother  . Colon cancer Neg Hx   . Stomach cancer Neg Hx   . Rectal cancer Neg Hx   . Esophageal cancer Neg Hx     Social History   Social History  . Marital Status: Single    Spouse Name: N/A  . Number of Children: N/A  . Years of Education: N/A   Social History Main Topics  . Smoking status: Former Research scientist (life sciences)  . Smokeless tobacco: Never Used     Comment: quit 10-11 years ago light smoker  . Alcohol Use: 0.0 oz/week    0 Standard drinks or equivalent per week     Comment: occ; every 2-3 months  . Drug Use: No  . Sexual Activity: Not Currently    Birth Control/ Protection: Surgical   Other Topics Concern  . Not on file   Social History Narrative   Single   Former Smoker  -  quit 10 to 11 years ago (light smoker)   Alcohol use-yes     2 children    Occupation: [employer]      Current outpatient prescriptions:  .  acetaminophen (TYLENOL) 325  MG tablet, Take 650 mg by mouth every 6 (six) hours as needed for pain (pain)., Disp: , Rfl:  .  aspirin 81 MG tablet, Take 81 mg by mouth daily.  , Disp: , Rfl:  .  Calcium Carbonate-Vitamin D (CALTRATE 600+D) 600-400 MG-UNIT per tablet, Take 1 tablet by mouth daily.  , Disp: , Rfl:  .  CRANBERRY PO, Take by mouth., Disp: , Rfl:  .  folic acid (FOLVITE) 1 MG tablet, Take 1 mg by mouth daily.  , Disp: , Rfl:  .  Garlic 774 MG TABS, Take 1 tablet by mouth daily., Disp: , Rfl:  .  glucose blood (ONETOUCH VERIO) test strip, Use as instructed, Disp: 100 each, Rfl: 12 .  lisinopril (PRINIVIL,ZESTRIL) 10 MG tablet, Take 0.5 tablets (5 mg total) by mouth daily., Disp: 45 tablet, Rfl: 1 .  metFORMIN (GLUCOPHAGE) 1000 MG tablet, Take 1 tablet (1,000 mg total) by mouth 2 (two) times daily with a meal., Disp: 180 tablet, Rfl: 1 .  Multiple Vitamin (MULTIVITAMIN) tablet, Take 1 tablet by mouth daily.  , Disp: , Rfl:  .  nitrofurantoin, macrocrystal-monohydrate, (MACROBID) 100 MG capsule, Take 1 capsule (100 mg total) by mouth 2 (two) times daily., Disp: 14 capsule, Rfl: 0 .  NON FORMULARY, Tumeric, Disp: , Rfl:  .  Omega-3 Fatty Acids (FISH OIL) 1000 MG CAPS, Take 1,000 capsules by mouth daily.  , Disp: , Rfl:  .  ONETOUCH DELICA LANCETS FINE MISC, USE TO CHECK BLOOD SUGAR ONCE DAILY, Disp: 100 each, Rfl: 3 .  simvastatin (ZOCOR) 40 MG tablet, Take 1 tablet (40 mg total) by mouth daily at 6 PM., Disp: 90 tablet, Rfl: 1 .  [DISCONTINUED] valsartan (DIOVAN) 160 MG tablet, Take 1/2 tablet by mouth once daily, Disp: 30 tablet, Rfl: 5  EXAM:  There were no vitals filed for this visit.  There is no weight on file to calculate BMI.  GENERAL: vitals reviewed and listed above, alert, oriented, appears well hydrated and in no acute distress  HEENT: atraumatic, conjunttiva clear, no obvious abnormalities on inspection of external nose and ears  NECK: no obvious masses on inspection  LUNGS: clear to  auscultation bilaterally, no wheezes, rales or rhonchi, good air movement  CV: HRRR, no peripheral edema  ABD: BS+, soft, NTTP, no CVA TTP  MS: moves all extremities without noticeable abnormality  PSYCH: pleasant and cooperative, no obvious depression or anxiety  ASSESSMENT AND PLAN:  Discussed the following assessment and plan:  Dysuria - Plan: POC Urinalysis Dipstick  -udip with blood and leuks and p -tx with macrobid - advised follow up if any symptoms persist -as leaving she asked for her rx for metformin to be sent to [facility] as is cheaper-rx sent -Patient advised to return or notify a doctor immediately if symptoms worsen or persist or new concerns arise.  Patient Instructions  BEFORE YOU LEAVE: -schedule follow up with your doctor or to transfer care to [facility] or Dr. Yong Channel -see if she wants her flu shot   -follow up if any symptoms persist          KIM, HANNAH R.

## 2015-08-15 ENCOUNTER — Other Ambulatory Visit: Payer: Self-pay | Admitting: *Deleted

## 2015-08-15 ENCOUNTER — Telehealth: Payer: Self-pay | Admitting: Internal Medicine

## 2015-08-15 ENCOUNTER — Encounter: Payer: Commercial Managed Care - HMO | Admitting: Family Medicine

## 2015-08-15 ENCOUNTER — Other Ambulatory Visit (INDEPENDENT_AMBULATORY_CARE_PROVIDER_SITE_OTHER): Payer: Commercial Managed Care - HMO

## 2015-08-15 DIAGNOSIS — R300 Dysuria: Secondary | ICD-10-CM

## 2015-08-15 LAB — POCT URINALYSIS DIPSTICK
Bilirubin, UA: NEGATIVE
Glucose, UA: NEGATIVE
Ketones, UA: NEGATIVE
NITRITE UA: POSITIVE
Spec Grav, UA: >=1.03
UROBILINOGEN UA: 0.2
pH, UA: 6

## 2015-08-15 LAB — URINALYSIS, MICROSCOPIC ONLY

## 2015-08-15 NOTE — Telephone Encounter (Signed)
I left a detailed message at the pts cell number with the information below and to call back to schedule an appt.

## 2015-08-15 NOTE — Telephone Encounter (Signed)
Advise recheck udip and culture.

## 2015-08-15 NOTE — Telephone Encounter (Signed)
Pt said she saw Dr Maudie Mercury on 08/05/15 for a UTI and has finish the antiobiotic. She said this morning she still having little pressure and is asking if Dr Maudie Mercury will give her another round of antibiotic.     Pharmacy [facility] [address]

## 2015-08-15 NOTE — Progress Notes (Signed)
This encounter was created in error - please disregard.

## 2015-08-16 MED ORDER — SULFAMETHOXAZOLE-TRIMETHOPRIM 400-80 MG PO TABS: 1.0000 | ORAL_TABLET | Freq: Two times a day (BID) | ORAL | Status: DC

## 2015-08-17 LAB — URINE CULTURE

## 2015-08-19 MED ORDER — CIPROFLOXACIN HCL 250 MG PO TABS: 250.0000 mg | ORAL_TABLET | Freq: Two times a day (BID) | ORAL | Status: DC

## 2015-08-19 NOTE — Addendum Note (Signed)
Addended by: Agnes Lawrence on: 08/19/2015 05:04 PM   Modules accepted: Orders

## 2015-08-22 ENCOUNTER — Telehealth: Payer: Self-pay | Admitting: Internal Medicine

## 2015-08-22 NOTE — Telephone Encounter (Signed)
Spoke with patient and clarified her time frame for labs. I also explained why a second antibiotic was called in for her UTI. I also advised that she transfer her care as she seems to have some chronic problems coupled with diabetes. She agreed and set an appointment to establish care with [facility] in November.

## 2015-08-22 NOTE — Telephone Encounter (Signed)
Pt saw Dr Maudie Mercury last week. Pt said she was told by Dr Maudie Mercury to call back and let her know when she was off because she was past dues for her labs. Pt said she was off Monday and would like to come in and have labs drawn.

## 2015-09-05 ENCOUNTER — Other Ambulatory Visit: Payer: Self-pay | Admitting: Internal Medicine

## 2015-10-01 ENCOUNTER — Other Ambulatory Visit: Payer: Self-pay

## 2015-10-01 DIAGNOSIS — Z1231 Encounter for screening mammogram for malignant neoplasm of breast: Secondary | ICD-10-CM

## 2015-10-07 ENCOUNTER — Telehealth: Payer: Self-pay | Admitting: Internal Medicine

## 2015-10-07 DIAGNOSIS — Z780 Asymptomatic menopausal state: Secondary | ICD-10-CM

## 2015-10-07 NOTE — Telephone Encounter (Signed)
Order placed

## 2015-10-07 NOTE — Telephone Encounter (Signed)
Amber call from [facility] call to say that they can not use preventive health care as the reason for the bone density. She would like a call back   Phone number ; [phone] ext [phone]

## 2015-10-08 ENCOUNTER — Other Ambulatory Visit: Payer: Self-pay | Admitting: Internal Medicine

## 2015-10-08 DIAGNOSIS — E2839 Other primary ovarian failure: Secondary | ICD-10-CM

## 2015-10-14 ENCOUNTER — Ambulatory Visit: Payer: Commercial Managed Care - HMO | Admitting: Adult Health

## 2015-10-24 ENCOUNTER — Ambulatory Visit (INDEPENDENT_AMBULATORY_CARE_PROVIDER_SITE_OTHER): Payer: Commercial Managed Care - HMO | Admitting: Adult Health

## 2015-10-24 ENCOUNTER — Encounter: Payer: Self-pay | Admitting: Adult Health

## 2015-10-24 VITALS — BP 110/64 | Temp 98.2°F | Ht 63.0 in | Wt 137.0 lb

## 2015-10-24 DIAGNOSIS — N3001 Acute cystitis with hematuria: Secondary | ICD-10-CM

## 2015-10-24 DIAGNOSIS — R300 Dysuria: Secondary | ICD-10-CM | POA: Diagnosis not present

## 2015-10-24 LAB — POCT URINALYSIS DIPSTICK
GLUCOSE UA: NEGATIVE
NITRITE UA: POSITIVE
Spec Grav, UA: 1.025
UROBILINOGEN UA: 2
pH, UA: 6

## 2015-10-24 MED ORDER — PHENAZOPYRIDINE HCL 200 MG PO TABS: 200.0000 mg | ORAL_TABLET | Freq: Three times a day (TID) | ORAL | Status: DC | PRN

## 2015-10-24 MED ORDER — CIPROFLOXACIN HCL 500 MG PO TABS: 500.0000 mg | ORAL_TABLET | Freq: Two times a day (BID) | ORAL | Status: DC

## 2015-10-24 NOTE — Progress Notes (Signed)
[id number] Cathy Orleans, DO Chief Complaint  Patient presents with  . Dysuria    Current Issues:  Presents with 2 days of dysuria, urinary urgency and urinary frequency Associated symptoms include:  cloudy urine, flank pain bilaterally, hematuria, urinary frequency, urinary retention and urinary urgency  There is a previous history of of similar symptoms. Sexually active:  Yes with female.   No concern for STI.  Prior to Admission medications   Medication Sig Start Date End Date Taking? Authorizing Provider  acetaminophen (TYLENOL) 325 MG tablet Take 650 mg by mouth every 6 (six) hours as needed for pain (pain).    Historical Provider, MD  aspirin 81 MG tablet Take 81 mg by mouth daily.      Historical Provider, MD  Calcium Carbonate-Vitamin D (CALTRATE 600+D) 600-400 MG-UNIT per tablet Take 1 tablet by mouth daily.      Historical Provider, MD  CRANBERRY PO Take by mouth.    Historical Provider, MD  folic acid (FOLVITE) 1 MG tablet Take 1 mg by mouth daily.      Historical Provider, MD  Garlic [id number] MG TABS Take 1 tablet by mouth daily.    Historical Provider, MD  glucose blood (ONETOUCH VERIO) test strip Use as instructed 01/04/15   Cathy R Cathy Orleans, DO  lisinopril (PRINIVIL,ZESTRIL) 10 MG tablet Take 0.5 tablets (5 mg total) by mouth daily. 01/04/15   Cathy R Cathy Orleans, DO  metFORMIN (GLUCOPHAGE) 1000 MG tablet Take 1 tablet (1,000 mg total) by mouth 2 (two) times daily with a meal. 08/05/15   Lucretia Kern, DO  Multiple Vitamin (MULTIVITAMIN) tablet Take 1 tablet by mouth daily.      Historical Provider, MD  NON FORMULARY Tumeric    Historical Provider, MD  Omega-3 Fatty Acids (FISH OIL) 1000 MG CAPS Take 1,000 capsules by mouth daily.      Historical Provider, MD  [facility] DELICA LANCETS FINE MISC USE TO CHECK BLOOD SUGAR ONCE DAILY 01/04/15   Cathy R Cathy Orleans, DO  simvastatin (ZOCOR) 40 MG tablet take 1 tablet by mouth once daily AT 6 PM 09/06/15   Cathy R Cathy Orleans, DO    Review of Systems:  PE:  BP  110/64 mmHg  Temp(Src) 98.2 F (36.8 C) (Oral)  Ht 5\' 3"  (1.6 m)  Wt 137 lb (62.143 kg)  BMI 24.27 kg/m2 Constitutional: Alert and oriented. In no acute distress.  Heart: Normal rate, normal rhythm. No murmur/rub/gallop Lungs" Clear to auscultation Back + bilateral CVA tenderness Abdomen: No pain with palpation. No distention, masses or rebound tenderness   Results for orders placed or performed in visit on 10/24/15  POCT urinalysis dipstick  Result Value Ref Range   Color, UA orange    Clarity, UA cloudy    Glucose, UA n    Bilirubin, UA 2+    Ketones, UA +- 5 mg/dl    Spec Grav, UA 1.025    Blood, UA +-10 ery/dl    pH, UA 6.0    Protein, UA +- 15 mg/dl    Urobilinogen, UA 2.0    Nitrite, UA pos    Leukocytes, UA large (3+) (A) Negative    Assessment and Plan:   1. Dysuria  - POCT urinalysis dipstick; Standing - Culture, Urine - POCT urinalysis dipstick  2. Acute cystitis with hematuria [N30.01]  - ciprofloxacin (CIPRO) 500 MG tablet; Take 1 tablet (500 mg total) by mouth 2 (two) times daily.  Dispense: 6 tablet; Refill: 0 - phenazopyridine (PYRIDIUM) 200 MG tablet; Take  1 tablet (200 mg total) by mouth 3 (three) times daily as needed for pain.  Dispense: 10 tablet; Refill: 0

## 2015-10-24 NOTE — Patient Instructions (Signed)

## 2015-10-24 NOTE — Progress Notes (Signed)
Pre visit review using our clinic review tool, if applicable. No additional management support is needed unless otherwise documented below in the visit note. 

## 2015-10-27 LAB — URINE CULTURE

## 2015-10-28 ENCOUNTER — Telehealth: Payer: Self-pay | Admitting: Internal Medicine

## 2015-10-28 ENCOUNTER — Other Ambulatory Visit: Payer: Self-pay | Admitting: Adult Health

## 2015-10-28 ENCOUNTER — Ambulatory Visit (INDEPENDENT_AMBULATORY_CARE_PROVIDER_SITE_OTHER): Payer: Commercial Managed Care - HMO | Admitting: Family Medicine

## 2015-10-28 VITALS — BP 100/68 | HR 94 | Temp 98.1°F | Ht 63.0 in | Wt 137.4 lb

## 2015-10-28 DIAGNOSIS — E785 Hyperlipidemia, unspecified: Secondary | ICD-10-CM

## 2015-10-28 DIAGNOSIS — E118 Type 2 diabetes mellitus with unspecified complications: Secondary | ICD-10-CM

## 2015-10-28 DIAGNOSIS — Z1159 Encounter for screening for other viral diseases: Secondary | ICD-10-CM | POA: Diagnosis not present

## 2015-10-28 DIAGNOSIS — I10 Essential (primary) hypertension: Secondary | ICD-10-CM

## 2015-10-28 LAB — BASIC METABOLIC PANEL
BUN: 17 mg/dL (ref 6–23)
CALCIUM: 10.1 mg/dL (ref 8.4–10.5)
CHLORIDE: 100 meq/L (ref 96–112)
CO2: 29 mEq/L (ref 19–32)
CREATININE: 0.8 mg/dL (ref 0.40–1.20)
GFR: 92.01 mL/min (ref >60.00)
Glucose, Bld: 94 mg/dL (ref 70–99)
Potassium: 5.2 mEq/L — ABNORMAL HIGH (ref 3.5–5.1)
Sodium: 136 mEq/L (ref 135–145)

## 2015-10-28 LAB — HEMOGLOBIN A1C: HEMOGLOBIN A1C: 6.1 % (ref 4.6–6.5)

## 2015-10-28 MED ORDER — FLUCONAZOLE 150 MG PO TABS: 150.0000 mg | ORAL_TABLET | Freq: Once | ORAL | Status: DC

## 2015-10-28 MED ORDER — NITROFURANTOIN MONOHYD MACRO 100 MG PO CAPS: 100.0000 mg | ORAL_CAPSULE | Freq: Two times a day (BID) | ORAL | Status: DC

## 2015-10-28 NOTE — Telephone Encounter (Signed)
error 

## 2015-10-28 NOTE — Progress Notes (Signed)
HPI:  Cathy [name] is a 69 yo Pt of Dr. Shawna Orleans whom is schedule to see [name] for a transfer of care later this month, here for a follow up for her diabetes. Takes metformin 1000mg  bid. On acei and asa. Blood pressure well controlled. Reports exercises on a regular basis and tries to watch her diet. Denies:polyuria, polydipsia, wounds, changes in vision. Sees Dr. Claudean Kinds at [facility] optho. Reports eye exam UTD.  ROS: See pertinent positives and negatives per HPI.  Past Medical History  Diagnosis Date  . Diabetes mellitus type II   . Hyperlipidemia   . Hypertension   . History of depression   . DJD (degenerative joint disease)   . Low back pain   . History of MRSA infection     recurrent carbuncle  . GERD (gastroesophageal reflux disease)   . Depression   . Allergy     Past Surgical History  Procedure Laterality Date  . Breast biopsy    . Appendectomy    . Abdominal hysterectomy    . Tubal ligation    . Inguinal hernia repair    . Colonoscopy      Family History  Problem Relation Age of Onset  . Aneurysm Mother 3    deceased secondary to brain aneurysm  . Liver cancer Father 21    deceased  . Diabetes      grandmother  . Colon cancer Neg Hx   . Stomach cancer Neg Hx   . Rectal cancer Neg Hx   . Esophageal cancer Neg Hx     Social History   Social History  . Marital Status: Single    Spouse Name: N/A  . Number of Children: N/A  . Years of Education: N/A   Social History Main Topics  . Smoking status: Former Research scientist (life sciences)  . Smokeless tobacco: Never Used     Comment: quit 10-11 years ago light smoker  . Alcohol Use: 0.0 oz/week    0 Standard drinks or equivalent per week     Comment: occ; every 2-3 months  . Drug Use: No  . Sexual Activity: Not Currently    Birth Control/ Protection: Surgical   Other Topics Concern  . Not on file   Social History Narrative   Single   Former Smoker  -  quit 10 to 11 years ago (light smoker)   Alcohol use-yes     2 children     Occupation: [employer]      Current outpatient prescriptions:  .  acetaminophen (TYLENOL) 325 MG tablet, Take 650 mg by mouth every 6 (six) hours as needed for pain (pain)., Disp: , Rfl:  .  aspirin 81 MG tablet, Take 81 mg by mouth daily.  , Disp: , Rfl:  .  Calcium Carbonate-Vitamin D (CALTRATE 600+D) 600-400 MG-UNIT per tablet, Take 1 tablet by mouth daily.  , Disp: , Rfl:  .  ciprofloxacin (CIPRO) 500 MG tablet, Take 1 tablet (500 mg total) by mouth 2 (two) times daily., Disp: 6 tablet, Rfl: 0 .  CRANBERRY PO, Take by mouth., Disp: , Rfl:  .  fluconazole (DIFLUCAN) 150 MG tablet, Take 1 tablet (150 mg total) by mouth once., Disp: 1 tablet, Rfl: 1 .  folic acid (FOLVITE) 1 MG tablet, Take 1 mg by mouth daily.  , Disp: , Rfl:  .  Garlic [id number] MG TABS, Take 1 tablet by mouth daily., Disp: , Rfl:  .  glucose blood (ONETOUCH VERIO) test strip,  Use as instructed, Disp: 100 each, Rfl: 12 .  lisinopril (PRINIVIL,ZESTRIL) 10 MG tablet, Take 0.5 tablets (5 mg total) by mouth daily., Disp: 45 tablet, Rfl: 1 .  metFORMIN (GLUCOPHAGE) 1000 MG tablet, Take 1 tablet (1,000 mg total) by mouth 2 (two) times daily with a meal., Disp: 180 tablet, Rfl: 0 .  Multiple Vitamin (MULTIVITAMIN) tablet, Take 1 tablet by mouth daily.  , Disp: , Rfl:  .  nitrofurantoin, macrocrystal-monohydrate, (MACROBID) 100 MG capsule, Take 1 capsule (100 mg total) by mouth 2 (two) times daily., Disp: 10 capsule, Rfl: 0 .  NON FORMULARY, Tumeric, Disp: , Rfl:  .  Omega-3 Fatty Acids (FISH OIL) 1000 MG CAPS, Take 1,000 capsules by mouth daily.  , Disp: , Rfl:  .  ONETOUCH DELICA LANCETS FINE MISC, USE TO CHECK BLOOD SUGAR ONCE DAILY, Disp: 100 each, Rfl: 3 .  phenazopyridine (PYRIDIUM) 200 MG tablet, Take 1 tablet (200 mg total) by mouth 3 (three) times daily as needed for pain., Disp: 10 tablet, Rfl: 0 .  simvastatin (ZOCOR) 40 MG tablet, take 1 tablet by mouth once daily AT 6 PM, Disp: 90 tablet, Rfl: 1 .   [DISCONTINUED] valsartan (DIOVAN) 160 MG tablet, Take 1/2 tablet by mouth once daily, Disp: 30 tablet, Rfl: 5  EXAM:  Filed Vitals:   10/28/15 1338  BP: 100/68  Pulse: 94  Temp: 98.1 F (36.7 C)    Body mass index is 24.35 kg/(m^2).  GENERAL: vitals reviewed and listed above, alert, oriented, appears well hydrated and in no acute distress  HEENT: atraumatic, conjunttiva clear, no obvious abnormalities on inspection of external nose and ears  NECK: no obvious masses on inspection  LUNGS: clear to auscultation bilaterally, no wheezes, rales or rhonchi, good air movement  CV: HRRR, no peripheral edema  MS: moves all extremities without noticeable abnormality  PSYCH: pleasant and cooperative, no obvious depression or anxiety  ASSESSMENT AND PLAN:  Discussed the following assessment and plan:  Controlled type 2 diabetes mellitus with complication, without long-term current use of insulin (HCC) - Plan: Hemoglobin A1c  Essential hypertension - Plan: Basic metabolic panel  Dyslipidemia  Need for hepatitis C screening test - Plan: Hep C Antibody  -Patient advised to return or notify a doctor immediately if symptoms worsen or persist or new concerns arise.  Patient Instructions  BEFORE YOU LEAVE: -labs -Wendie Simmer, can you get permission and obtain diabetic eye exam from Dr. Claudean Kinds at [facility] -keep follow up as scheduled  -We have ordered labs or studies at this visit. It can take up to 1-2 weeks for results and processing. We will contact you with instructions IF your results are abnormal. Normal results will be released to your [facility]. If you have not heard from [place] or can not find your results in [facility] in 2 weeks please contact our office.  We recommend the following healthy lifestyle measures: - eat a healthy whole foods diet consisting of regular small meals composed of vegetables, fruits, beans, nuts, seeds, healthy meats such as white chicken and fish and  whole grains.  - avoid sweets, white starchy foods, fried foods, fast food, processed foods, sodas, red meet and other fattening foods.  - get a least 150-300 minutes of aerobic exercise per week.            Colin Benton R.

## 2015-10-28 NOTE — Telephone Encounter (Signed)
Pt call to say that she still has the UTI and would like to have DIFLUCAN.    Pharmacy [facility]

## 2015-10-28 NOTE — Progress Notes (Signed)
Pre visit review using our clinic review tool, if applicable. No additional management support is needed unless otherwise documented below in the visit note. 

## 2015-10-28 NOTE — Telephone Encounter (Signed)
Urine culture came back with resitstence to cipro. She was sensative to [employer]. Will send in 5 days BID of Macrobid as well as Diflucan

## 2015-10-28 NOTE — Telephone Encounter (Signed)
Called and spoke with pt and pt states she is still having UTI symptoms and she usually gets diflucan sent to the pharmacy as well.  Per [name] send in nitrofurantoin 100 mg bid x 5 days.  Ok to send in diflucan as well. Spoke with pt and advised of Cory's recommendations: [name] would like to change antibiotic due to culture results. and pt hung up the phone.  Rx sent to pharmacy.

## 2015-10-28 NOTE — Patient Instructions (Signed)
BEFORE YOU LEAVE: -labs -Wendie Simmer, can you get permission and obtain diabetic eye exam from Dr. Claudean Kinds at [facility] -keep follow up as scheduled  -We have ordered labs or studies at this visit. It can take up to 1-2 weeks for results and processing. We will contact you with instructions IF your results are abnormal. Normal results will be released to your [facility]. If you have not heard from [place] or can not find your results in [facility] in 2 weeks please contact our office.  We recommend the following healthy lifestyle measures: - eat a healthy whole foods diet consisting of regular small meals composed of vegetables, fruits, beans, nuts, seeds, healthy meats such as white chicken and fish and whole grains.  - avoid sweets, white starchy foods, fried foods, fast food, processed foods, sodas, red meet and other fattening foods.  - get a least 150-300 minutes of aerobic exercise per week.

## 2015-10-28 NOTE — Telephone Encounter (Signed)
Called and

## 2015-10-29 ENCOUNTER — Telehealth: Payer: Self-pay | Admitting: Family Medicine

## 2015-10-29 LAB — HEPATITIS C ANTIBODY: HCV AB: NEGATIVE

## 2015-10-29 NOTE — Telephone Encounter (Signed)
Cathy [name] called saying she's concerned about the conversation she had earlier with [facility]. She received lab results and was told to stop taking vitamins but she's extremely confused. She'd like a phone call back please.  Pt's ph# [phone] Thank you.

## 2015-10-30 NOTE — Telephone Encounter (Signed)
Left message on machine for patient to return our call 

## 2015-10-30 NOTE — Telephone Encounter (Signed)
Left message on machine for patient returning her call 

## 2015-10-30 NOTE — Telephone Encounter (Signed)
Patient is aware 

## 2015-11-06 ENCOUNTER — Ambulatory Visit
Admission: RE | Admit: 2015-11-06 | Discharge: 2015-11-06 | Disposition: A | Discharge status: Home / Self Care | Payer: Commercial Managed Care - HMO | Source: Ambulatory Visit

## 2015-11-06 DIAGNOSIS — Z1231 Encounter for screening mammogram for malignant neoplasm of breast: Secondary | ICD-10-CM

## 2015-11-13 ENCOUNTER — Ambulatory Visit: Payer: Commercial Managed Care - HMO | Admitting: Adult Health

## 2015-12-17 ENCOUNTER — Other Ambulatory Visit: Payer: Self-pay | Admitting: Family Medicine

## 2015-12-17 MED ORDER — LISINOPRIL 10 MG PO TABS: 5.0000 mg | ORAL_TABLET | Freq: Every day | ORAL | Status: DC

## 2015-12-17 NOTE — Telephone Encounter (Signed)
Sent to the pharmacy by e-scribe. 

## 2016-01-07 ENCOUNTER — Ambulatory Visit (INDEPENDENT_AMBULATORY_CARE_PROVIDER_SITE_OTHER): Payer: Commercial Managed Care - HMO | Admitting: Family Medicine

## 2016-01-07 ENCOUNTER — Encounter: Payer: Self-pay | Admitting: Family Medicine

## 2016-01-07 ENCOUNTER — Other Ambulatory Visit: Payer: Self-pay | Admitting: Adult Health

## 2016-01-07 VITALS — BP 120/72 | HR 88 | Temp 97.4°F | Ht 63.0 in | Wt 137.9 lb

## 2016-01-07 DIAGNOSIS — R300 Dysuria: Secondary | ICD-10-CM

## 2016-01-07 LAB — POCT URINALYSIS DIPSTICK
Bilirubin, UA: NEGATIVE
GLUCOSE UA: NEGATIVE
KETONES UA: NEGATIVE
Nitrite, UA: POSITIVE
Protein, UA: NEGATIVE
RBC UA: NEGATIVE
SPEC GRAV UA: 1.015
UROBILINOGEN UA: 0.2
pH, UA: 6

## 2016-01-07 MED ORDER — NITROFURANTOIN MONOHYD MACRO 100 MG PO CAPS: 100.0000 mg | ORAL_CAPSULE | Freq: Two times a day (BID) | ORAL | Status: DC

## 2016-01-07 NOTE — Progress Notes (Signed)
HPI:  Acute visit for:  Dysuria: -hx UTI -these symptoms started a few days ago -symptoms: frequency, urgency, pressure -denies:fevers, abd or flank pain, hematuria, nausea, vomiting, vaginal symptoms -last UTI e. Coli, sensitive to macrobid  ROS: See pertinent positives and negatives per HPI.  Past Medical History  Diagnosis Date  . Diabetes mellitus type II   . Hyperlipidemia   . Hypertension   . History of depression   . DJD (degenerative joint disease)   . Low back pain   . History of MRSA infection     recurrent carbuncle  . GERD (gastroesophageal reflux disease)   . Depression   . Allergy     Past Surgical History  Procedure Laterality Date  . Breast biopsy    . Appendectomy    . Abdominal hysterectomy    . Tubal ligation    . Inguinal hernia repair    . Colonoscopy      Family History  Problem Relation Age of Onset  . Aneurysm Mother 52    deceased secondary to brain aneurysm  . Liver cancer Father 33    deceased  . Diabetes      grandmother  . Colon cancer Neg Hx   . Stomach cancer Neg Hx   . Rectal cancer Neg Hx   . Esophageal cancer Neg Hx     Social History   Social History  . Marital Status: Single    Spouse Name: N/A  . Number of Children: N/A  . Years of Education: N/A   Social History Main Topics  . Smoking status: Former Research scientist (life sciences)  . Smokeless tobacco: Never Used     Comment: quit 10-11 years ago light smoker  . Alcohol Use: 0.0 oz/week    0 Standard drinks or equivalent per week     Comment: occ; every 2-3 months  . Drug Use: No  . Sexual Activity: Not Currently    Birth Control/ Protection: Surgical   Other Topics Concern  . None   Social History Narrative   Single   Former Smoker  -  quit 10 to 11 years ago (light smoker)   Alcohol use-yes     2 children    Occupation: [employer]      Current outpatient prescriptions:  .  acetaminophen (TYLENOL) 325 MG tablet, Take 650 mg by mouth every 6 (six) hours  as needed for pain (pain)., Disp: , Rfl:  .  aspirin 81 MG tablet, Take 81 mg by mouth daily.  , Disp: , Rfl:  .  Calcium Carbonate-Vitamin D (CALTRATE 600+D) 600-400 MG-UNIT per tablet, Take 1 tablet by mouth daily.  , Disp: , Rfl:  .  CRANBERRY PO, Take by mouth., Disp: , Rfl:  .  Garlic 123XX123 MG TABS, Take 1 tablet by mouth daily., Disp: , Rfl:  .  glucose blood (ONETOUCH VERIO) test strip, Use as instructed, Disp: 100 each, Rfl: 12 .  lisinopril (PRINIVIL,ZESTRIL) 10 MG tablet, Take 0.5 tablets (5 mg total) by mouth daily., Disp: 45 tablet, Rfl: 0 .  metFORMIN (GLUCOPHAGE) 1000 MG tablet, Take 1 tablet (1,000 mg total) by mouth 2 (two) times daily with a meal., Disp: 180 tablet, Rfl: 0 .  Multiple Vitamin (MULTIVITAMIN) tablet, Take 1 tablet by mouth daily.  , Disp: , Rfl:  .  NON FORMULARY, Tumeric, Disp: , Rfl:  .  Omega-3 Fatty Acids (FISH OIL) 1000 MG CAPS, Take 1,000 capsules by mouth daily.  , Disp: , Rfl:  .  ONETOUCH DELICA LANCETS FINE MISC, USE TO CHECK BLOOD SUGAR ONCE DAILY, Disp: 100 each, Rfl: 3 .  simvastatin (ZOCOR) 40 MG tablet, take 1 tablet by mouth once daily AT 6 PM, Disp: 90 tablet, Rfl: 1 .  nitrofurantoin, macrocrystal-monohydrate, (MACROBID) 100 MG capsule, Take 1 capsule (100 mg total) by mouth 2 (two) times daily., Disp: 14 capsule, Rfl: 0 .  [DISCONTINUED] valsartan (DIOVAN) 160 MG tablet, Take 1/2 tablet by mouth once daily, Disp: 30 tablet, Rfl: 5  EXAM:  Filed Vitals:   01/07/16 0812  BP: 120/72  Pulse: 88  Temp: 97.4 F (36.3 C)    Body mass index is 24.43 kg/(m^2).  GENERAL: vitals reviewed and listed above, alert, oriented, appears well hydrated and in no acute distress  HEENT: atraumatic, conjunttiva clear, no obvious abnormalities on inspection of external nose and ears  NECK: no obvious masses on inspection  LUNGS: clear to auscultation bilaterally, no wheezes, rales or rhonchi, good air movement  CV: HRRR, no peripheral edema  ABD: BS+,  soft, NTTP, no CVA TTP  MS: moves all extremities without noticeable abnormality  PSYCH: pleasant and cooperative, no obvious depression or anxiety  ASSESSMENT AND PLAN:  Discussed the following assessment and plan:  Dysuria - Plan: POC Urinalysis Dipstick  -udip c/w UTI, opted to treat with macrobid -Patient advised to return or notify a doctor immediately if symptoms worsen or persist or new concerns arise.  Patient Instructions  Please take the antibiotic as prescribed  Urinary Tract Infection Urinary tract infections (UTIs) can develop anywhere along your urinary tract. Your urinary tract is your body's drainage system for removing wastes and extra water. Your urinary tract includes two kidneys, two ureters, a bladder, and a urethra. Your kidneys are a pair of bean-shaped organs. Each kidney is about the size of your fist. They are located below your ribs, one on each side of your spine. CAUSES Infections are caused by microbes, which are microscopic organisms, including fungi, viruses, and bacteria. These organisms are so small that they can only be seen through a microscope. Bacteria are the microbes that most commonly cause UTIs. SYMPTOMS  Symptoms of UTIs may vary by age and gender of the patient and by the location of the infection. Symptoms in young women typically include a frequent and intense urge to urinate and a painful, burning feeling in the bladder or urethra during urination. Older women and men are more likely to be tired, shaky, and weak and have muscle aches and abdominal pain. A fever may mean the infection is in your kidneys. Other symptoms of a kidney infection include pain in your back or sides below the ribs, nausea, and vomiting. DIAGNOSIS To diagnose a UTI, your caregiver will ask you about your symptoms. Your caregiver will also ask you to provide a urine sample. The urine sample will be tested for bacteria and white blood cells. White blood cells are made by  your body to help fight infection. TREATMENT  Typically, UTIs can be treated with medication. Because most UTIs are caused by a bacterial infection, they usually can be treated with the use of antibiotics. The choice of antibiotic and length of treatment depend on your symptoms and the type of bacteria causing your infection. HOME CARE INSTRUCTIONS  If you were prescribed antibiotics, take them exactly as your caregiver instructs you. Finish the medication even if you feel better after you have only taken some of the medication.  Drink enough water and fluids to keep your  urine clear or pale yellow.  Avoid caffeine, tea, and carbonated beverages. They tend to irritate your bladder.  Empty your bladder often. Avoid holding urine for long periods of time.  Empty your bladder before and after sexual intercourse.  After a bowel movement, women should cleanse from front to back. Use each tissue only once. SEEK MEDICAL CARE IF:   You have back pain.  You develop a fever.  Your symptoms do not begin to resolve within 3 days. SEEK IMMEDIATE MEDICAL CARE IF:   You have severe back pain or lower abdominal pain.  You develop chills.  You have nausea or vomiting.  You have continued burning or discomfort with urination. MAKE SURE YOU:   Understand these instructions.  Will watch your condition.  Will get help right away if you are not doing well or get worse.   This information is not intended to replace advice given to you by your health care provider. Make sure you discuss any questions you have with your health care provider.   Document Released: 08/19/2005 Document Revised: 07/31/2015 Document Reviewed: 12/18/2011 Elsevier Interactive Patient Education 2016 [place], [place]

## 2016-01-07 NOTE — Progress Notes (Signed)
Pre visit review using our clinic review tool, if applicable. No additional management support is needed unless otherwise documented below in the visit note. 

## 2016-01-07 NOTE — Patient Instructions (Signed)
Please take the antibiotic as prescribed  Urinary Tract Infection Urinary tract infections (UTIs) can develop anywhere along your urinary tract. Your urinary tract is your body's drainage system for removing wastes and extra water. Your urinary tract includes two kidneys, two ureters, a bladder, and a urethra. Your kidneys are a pair of bean-shaped organs. Each kidney is about the size of your fist. They are located below your ribs, one on each side of your spine. CAUSES Infections are caused by microbes, which are microscopic organisms, including fungi, viruses, and bacteria. These organisms are so small that they can only be seen through a microscope. Bacteria are the microbes that most commonly cause UTIs. SYMPTOMS  Symptoms of UTIs may vary by age and gender of the patient and by the location of the infection. Symptoms in young women typically include a frequent and intense urge to urinate and a painful, burning feeling in the bladder or urethra during urination. Older women and men are more likely to be tired, shaky, and weak and have muscle aches and abdominal pain. A fever may mean the infection is in your kidneys. Other symptoms of a kidney infection include pain in your back or sides below the ribs, nausea, and vomiting. DIAGNOSIS To diagnose a UTI, your caregiver will ask you about your symptoms. Your caregiver will also ask you to provide a urine sample. The urine sample will be tested for bacteria and white blood cells. White blood cells are made by your body to help fight infection. TREATMENT  Typically, UTIs can be treated with medication. Because most UTIs are caused by a bacterial infection, they usually can be treated with the use of antibiotics. The choice of antibiotic and length of treatment depend on your symptoms and the type of bacteria causing your infection. HOME CARE INSTRUCTIONS  If you were prescribed antibiotics, take them exactly as your caregiver instructs you. Finish the  medication even if you feel better after you have only taken some of the medication.  Drink enough water and fluids to keep your urine clear or pale yellow.  Avoid caffeine, tea, and carbonated beverages. They tend to irritate your bladder.  Empty your bladder often. Avoid holding urine for long periods of time.  Empty your bladder before and after sexual intercourse.  After a bowel movement, women should cleanse from front to back. Use each tissue only once. SEEK MEDICAL CARE IF:   You have back pain.  You develop a fever.  Your symptoms do not begin to resolve within 3 days. SEEK IMMEDIATE MEDICAL CARE IF:   You have severe back pain or lower abdominal pain.  You develop chills.  You have nausea or vomiting.  You have continued burning or discomfort with urination. MAKE SURE YOU:   Understand these instructions.  Will watch your condition.  Will get help right away if you are not doing well or get worse.   This information is not intended to replace advice given to you by your health care provider. Make sure you discuss any questions you have with your health care provider.   Document Released: 08/19/2005 Document Revised: 07/31/2015 Document Reviewed: 12/18/2011 Elsevier Interactive Patient Education [employer].

## 2016-01-09 ENCOUNTER — Telehealth: Payer: Self-pay | Admitting: Family Medicine

## 2016-01-09 NOTE — Telephone Encounter (Signed)
[facility]  Patient Name: Cathy [name]  DOB: [birth year]    Initial Comment Caller states straining trying to pee and felt something moving in vaginal area. Doesn't feel it now that she's laying down trying to feel for it.   Nurse Assessment  Nurse: Justine Null, RN, Rodena Piety Date/Time Eilene Ghazi Time): 01/09/2016 5:07:22 PM  Confirm and document reason for call. If symptomatic, describe symptoms. You must click the next button to save text entered. ---Caller states straining trying to pee and felt something moving in vaginal area. Doesn't feel it now that she's laying down trying to feel for it. caller stated that she has been able to pass without difficulty no burning or stinging with urination and has no fevers and has nothing coming out of the vaginal area and has happened before  Has the patient traveled out of the country within the last 30 days? ---No  Does the patient have any new or worsening symptoms? ---Yes  Will a triage be completed? ---Yes  Related visit to physician within the last 2 weeks? ---Yes  Does the PT have any chronic conditions? (i.e. diabetes, asthma, etc.) ---Yes  List chronic conditions. ---HTN diabetes high cholesterol  Is this a behavioral health or substance abuse call? ---No     Guidelines    Guideline Title Affirmed Question Affirmed Notes  Vaginal Symptoms All other vaginal symptoms (Exception: feels like prior yeast infection, minor abrasion, mild rash < 24 hour duration, mild itching)    Final Disposition User   See PCP within [place], RN, Rodena Piety    Referrals  REFERRED TO PCP OFFICE   Disagree/Comply: Leta Baptist

## 2016-01-10 NOTE — Telephone Encounter (Signed)
Pt will se Dr Maudie Mercury on Monday at 9:30

## 2016-01-13 ENCOUNTER — Ambulatory Visit (INDEPENDENT_AMBULATORY_CARE_PROVIDER_SITE_OTHER): Payer: Commercial Managed Care - HMO | Admitting: Family Medicine

## 2016-01-13 ENCOUNTER — Encounter: Payer: Self-pay | Admitting: Family Medicine

## 2016-01-13 VITALS — BP 120/60 | HR 97 | Temp 97.9°F | Ht 63.0 in | Wt 136.3 lb

## 2016-01-13 DIAGNOSIS — N949 Unspecified condition associated with female genital organs and menstrual cycle: Secondary | ICD-10-CM

## 2016-01-13 LAB — POCT URINALYSIS DIPSTICK
Bilirubin, UA: NEGATIVE
Blood, UA: NEGATIVE
GLUCOSE UA: NEGATIVE
Ketones, UA: NEGATIVE
LEUKOCYTES UA: NEGATIVE
NITRITE UA: NEGATIVE
Protein, UA: NEGATIVE
SPEC GRAV UA: 1.01
UROBILINOGEN UA: 0.2
pH, UA: 6

## 2016-01-13 NOTE — Progress Notes (Signed)
Pre visit review using our clinic review tool, if applicable. No additional management support is needed unless otherwise documented below in the visit note. 

## 2016-01-13 NOTE — Progress Notes (Signed)
HPI:  Cathy [name] is a pleasant 70 year old woman, here for an acute visit for a gynecological complaints. She reports that once when she was using the restroom she noticed a bulge in the vaginal area. She reports all urinary symptoms have resolved and is not having any vaginal discharge, pain, fevers, vaginal bleeding or urinary symptoms. She reports a history of hysterectomy and has frequent urinary tract infections.  ROS: See pertinent positives and negatives per HPI.  Past Medical History  Diagnosis Date  . Diabetes mellitus type II   . Hyperlipidemia   . Hypertension   . History of depression   . DJD (degenerative joint disease)   . Low back pain   . History of MRSA infection     recurrent carbuncle  . GERD (gastroesophageal reflux disease)   . Depression   . Allergy     Past Surgical History  Procedure Laterality Date  . Breast biopsy    . Appendectomy    . Abdominal hysterectomy    . Tubal ligation    . Inguinal hernia repair    . Colonoscopy      Family History  Problem Relation Age of Onset  . Aneurysm Mother 71    deceased secondary to brain aneurysm  . Liver cancer Father 34    deceased  . Diabetes      grandmother  . Colon cancer Neg Hx   . Stomach cancer Neg Hx   . Rectal cancer Neg Hx   . Esophageal cancer Neg Hx     Social History   Social History  . Marital Status: Single    Spouse Name: N/A  . Number of Children: N/A  . Years of Education: N/A   Social History Main Topics  . Smoking status: Former Research scientist (life sciences)  . Smokeless tobacco: Never Used     Comment: quit 10-11 years ago light smoker  . Alcohol Use: 0.0 oz/week    0 Standard drinks or equivalent per week     Comment: occ; every 2-3 months  . Drug Use: No  . Sexual Activity: Not Currently    Birth Control/ Protection: Surgical   Other Topics Concern  . None   Social History Narrative   Single   Former Smoker  -  quit 10 to 11 years ago (light smoker)   Alcohol use-yes     2  children    Occupation: [employer]      Current outpatient prescriptions:  .  acetaminophen (TYLENOL) 325 MG tablet, Take 650 mg by mouth every 6 (six) hours as needed for pain (pain)., Disp: , Rfl:  .  aspirin 81 MG tablet, Take 81 mg by mouth daily.  , Disp: , Rfl:  .  Calcium Carbonate-Vitamin D (CALTRATE 600+D) 600-400 MG-UNIT per tablet, Take 1 tablet by mouth daily.  , Disp: , Rfl:  .  CRANBERRY PO, Take by mouth., Disp: , Rfl:  .  Garlic 123XX123 MG TABS, Take 1 tablet by mouth daily., Disp: , Rfl:  .  glucose blood (ONETOUCH VERIO) test strip, Use as instructed, Disp: 100 each, Rfl: 12 .  lisinopril (PRINIVIL,ZESTRIL) 10 MG tablet, Take 0.5 tablets (5 mg total) by mouth daily., Disp: 45 tablet, Rfl: 0 .  metFORMIN (GLUCOPHAGE) 1000 MG tablet, Take 1 tablet (1,000 mg total) by mouth 2 (two) times daily with a meal., Disp: 180 tablet, Rfl: 0 .  Multiple Vitamin (MULTIVITAMIN) tablet, Take 1 tablet by mouth daily.  , Disp: ,  Rfl:  .  NON FORMULARY, Tumeric, Disp: , Rfl:  .  Omega-3 Fatty Acids (FISH OIL) 1000 MG CAPS, Take 1,000 capsules by mouth daily.  , Disp: , Rfl:  .  ONETOUCH DELICA LANCETS FINE MISC, USE TO CHECK BLOOD SUGAR ONCE DAILY, Disp: 100 each, Rfl: 3 .  simvastatin (ZOCOR) 40 MG tablet, take 1 tablet by mouth once daily AT 6 PM, Disp: 90 tablet, Rfl: 1 .  [DISCONTINUED] valsartan (DIOVAN) 160 MG tablet, Take 1/2 tablet by mouth once daily, Disp: 30 tablet, Rfl: 5  EXAM:  Filed Vitals:   01/13/16 0930  BP: 120/60  Pulse: 97  Temp: 97.9 F (36.6 C)    Body mass index is 24.15 kg/(m^2).  GENERAL: vitals reviewed and listed above, alert, oriented, appears well hydrated and in no acute distress  HEENT: atraumatic, conjunttiva clear, no obvious abnormalities on inspection of external nose and ears  NECK: no obvious masses on inspection  GU/ABD: Normal appearance of the external genitalia, no vaginal discharge, smooth soft bulge in the anterior vaginal  wall that protrudes in to the vaginal opening on Valsalva.  MS: moves all extremities without noticeable abnormality  PSYCH: pleasant and cooperative, no obvious depression or anxiety  ASSESSMENT AND PLAN:  Discussed the following assessment and plan:  Vaginal discomfort - Plan: POC Urinalysis Dipstick, Culture, Urine -urine dip is normal, weakened pelvic floor with bladder prolapse -discussed tx options and she plans to see gyn - brochure provided and she plans to schedule appointment -Patient advised to return or notify a doctor immediately if symptoms worsen or persist or new concerns arise.  There are no Patient Instructions on file for this visit.   Colin Benton R.

## 2016-01-15 ENCOUNTER — Telehealth: Payer: Self-pay | Admitting: Internal Medicine

## 2016-01-15 DIAGNOSIS — N390 Urinary tract infection, site not specified: Secondary | ICD-10-CM

## 2016-01-15 NOTE — Telephone Encounter (Signed)
Pt would like a referral to [facility] due to ongoing UTI's. Pt has seen Dr Maudie Mercury most of the time for this issue and wants to know if Dr Maudie Mercury will do referral for her.

## 2016-01-16 NOTE — Telephone Encounter (Signed)
The referral was entered and I left a detailed message with this information at the pts home number and that someone from our office will call with appt info.

## 2016-01-16 NOTE — Telephone Encounter (Signed)
Ok to refer.

## 2016-01-31 DIAGNOSIS — N8111 Cystocele, midline: Secondary | ICD-10-CM | POA: Diagnosis not present

## 2016-01-31 DIAGNOSIS — R3915 Urgency of urination: Secondary | ICD-10-CM | POA: Diagnosis not present

## 2016-01-31 DIAGNOSIS — Z0000 Encounter for general adult medical examination without abnormal findings: Secondary | ICD-10-CM | POA: Diagnosis not present

## 2016-02-05 ENCOUNTER — Telehealth: Payer: Self-pay | Admitting: Internal Medicine

## 2016-02-05 DIAGNOSIS — R319 Hematuria, unspecified: Secondary | ICD-10-CM | POA: Diagnosis not present

## 2016-02-05 DIAGNOSIS — Z780 Asymptomatic menopausal state: Secondary | ICD-10-CM | POA: Diagnosis not present

## 2016-02-05 DIAGNOSIS — N8189 Other female genital prolapse: Secondary | ICD-10-CM | POA: Diagnosis not present

## 2016-02-05 DIAGNOSIS — N390 Urinary tract infection, site not specified: Secondary | ICD-10-CM | POA: Diagnosis not present

## 2016-02-05 DIAGNOSIS — R350 Frequency of micturition: Secondary | ICD-10-CM | POA: Diagnosis not present

## 2016-02-05 NOTE — Telephone Encounter (Signed)
Pt states Dr Hartley Barefoot told her Friday he could do a bladder tuck. The OBGYN told pt today he could do a bladder "diva" cup.  Pt would like to know which one you think she should do. Please advise.

## 2016-02-06 NOTE — Telephone Encounter (Signed)
I do not perform either of these procedures and am not very familiar with the "diva" cup for bladder issues . I would advise she ask each provider about the various risks/benefits, then make a decision based on her preferences. My preference is always to start with the safest and least invasive strategy first.

## 2016-02-06 NOTE — Telephone Encounter (Signed)
I left a message for the pt to return my call. 

## 2016-02-06 NOTE — Telephone Encounter (Signed)
I called the pt and informed her of the message below

## 2016-02-11 ENCOUNTER — Other Ambulatory Visit: Payer: Self-pay | Admitting: Urology

## 2016-02-13 ENCOUNTER — Other Ambulatory Visit: Payer: Self-pay | Admitting: Urology

## 2016-02-28 ENCOUNTER — Encounter (HOSPITAL_BASED_OUTPATIENT_CLINIC_OR_DEPARTMENT_OTHER): Payer: Self-pay | Admitting: *Deleted

## 2016-02-28 DIAGNOSIS — N958 Other specified menopausal and perimenopausal disorders: Secondary | ICD-10-CM | POA: Diagnosis not present

## 2016-02-28 NOTE — Progress Notes (Signed)
NPO AFTER MN. ARRIVE AT 0700. NEEDS ISTAT AND EKG.  

## 2016-03-02 ENCOUNTER — Ambulatory Visit (HOSPITAL_BASED_OUTPATIENT_CLINIC_OR_DEPARTMENT_OTHER)
Admission: RE | Admit: 2016-03-02 | Discharge: 2016-03-02 | Disposition: A | Discharge status: Home / Self Care | Payer: Commercial Managed Care - HMO | Source: Ambulatory Visit | Attending: Urology | Admitting: Urology

## 2016-03-02 ENCOUNTER — Ambulatory Visit (HOSPITAL_BASED_OUTPATIENT_CLINIC_OR_DEPARTMENT_OTHER): Payer: Commercial Managed Care - HMO | Admitting: Anesthesiology

## 2016-03-02 ENCOUNTER — Other Ambulatory Visit: Payer: Self-pay

## 2016-03-02 ENCOUNTER — Encounter (HOSPITAL_BASED_OUTPATIENT_CLINIC_OR_DEPARTMENT_OTHER)
Admission: RE | Disposition: A | Discharge status: Home / Self Care | Payer: Self-pay | Source: Ambulatory Visit | Attending: Urology

## 2016-03-02 ENCOUNTER — Encounter (HOSPITAL_BASED_OUTPATIENT_CLINIC_OR_DEPARTMENT_OTHER): Payer: Self-pay | Admitting: Anesthesiology

## 2016-03-02 DIAGNOSIS — N8111 Cystocele, midline: Secondary | ICD-10-CM | POA: Diagnosis not present

## 2016-03-02 DIAGNOSIS — Z79899 Other long term (current) drug therapy: Secondary | ICD-10-CM | POA: Diagnosis not present

## 2016-03-02 DIAGNOSIS — N390 Urinary tract infection, site not specified: Secondary | ICD-10-CM | POA: Insufficient documentation

## 2016-03-02 DIAGNOSIS — N8189 Other female genital prolapse: Secondary | ICD-10-CM | POA: Diagnosis not present

## 2016-03-02 DIAGNOSIS — E119 Type 2 diabetes mellitus without complications: Secondary | ICD-10-CM | POA: Insufficient documentation

## 2016-03-02 DIAGNOSIS — Z8614 Personal history of Methicillin resistant Staphylococcus aureus infection: Secondary | ICD-10-CM | POA: Diagnosis not present

## 2016-03-02 DIAGNOSIS — Z87891 Personal history of nicotine dependence: Secondary | ICD-10-CM | POA: Insufficient documentation

## 2016-03-02 DIAGNOSIS — Z7984 Long term (current) use of oral hypoglycemic drugs: Secondary | ICD-10-CM | POA: Insufficient documentation

## 2016-03-02 DIAGNOSIS — Z7982 Long term (current) use of aspirin: Secondary | ICD-10-CM | POA: Insufficient documentation

## 2016-03-02 DIAGNOSIS — E785 Hyperlipidemia, unspecified: Secondary | ICD-10-CM | POA: Diagnosis not present

## 2016-03-02 DIAGNOSIS — I10 Essential (primary) hypertension: Secondary | ICD-10-CM | POA: Diagnosis not present

## 2016-03-02 DIAGNOSIS — M1990 Unspecified osteoarthritis, unspecified site: Secondary | ICD-10-CM | POA: Insufficient documentation

## 2016-03-02 DIAGNOSIS — Z87440 Personal history of urinary (tract) infections: Secondary | ICD-10-CM | POA: Insufficient documentation

## 2016-03-02 DIAGNOSIS — N8110 Cystocele, unspecified: Secondary | ICD-10-CM | POA: Diagnosis not present

## 2016-03-02 HISTORY — PX: VAGINAL PROLAPSE REPAIR: SHX830

## 2016-03-02 HISTORY — DX: Cystocele, unspecified: N81.10

## 2016-03-02 HISTORY — DX: Urgency of urination: R39.15

## 2016-03-02 HISTORY — DX: Personal history of urinary (tract) infections: Z87.440

## 2016-03-02 HISTORY — DX: Other constipation: K59.09

## 2016-03-02 HISTORY — DX: Personal history of other diseases of the digestive system: Z87.19

## 2016-03-02 LAB — POCT I-STAT 4, (NA,K, GLUC, HGB,HCT)
GLUCOSE: 95 mg/dL (ref 65–99)
HEMATOCRIT: 43 % (ref 36.0–46.0)
Hemoglobin: 14.6 g/dL (ref 12.0–15.0)
POTASSIUM: 4.1 mmol/L (ref 3.5–5.1)
SODIUM: 139 mmol/L (ref 135–145)

## 2016-03-02 LAB — GLUCOSE, CAPILLARY: Glucose-Capillary: 133 mg/dL — ABNORMAL HIGH (ref 65–99)

## 2016-03-02 SURGERY — SUSPENSION, VAGINAL VAULT
Anesthesia: General | Site: Vagina

## 2016-03-02 MED ORDER — BELLADONNA ALKALOIDS-OPIUM 16.2-60 MG RE SUPP
RECTAL | Status: AC
  Filled 2016-03-02: qty 1

## 2016-03-02 MED ORDER — KETOROLAC TROMETHAMINE 30 MG/ML IJ SOLN
INTRAMUSCULAR | Status: AC
  Filled 2016-03-02: qty 1

## 2016-03-02 MED ORDER — SODIUM CHLORIDE 0.9 % IV SOLN
INTRAVENOUS | Status: DC | PRN
  Administered 2016-03-02: 40 mL via VAGINAL

## 2016-03-02 MED ORDER — ONDANSETRON HCL 4 MG/2ML IJ SOLN
INTRAMUSCULAR | Status: DC | PRN
  Administered 2016-03-02: 4 mg via INTRAVENOUS

## 2016-03-02 MED ORDER — ACETAMINOPHEN 500 MG PO TABS
ORAL_TABLET | ORAL | Status: AC
  Filled 2016-03-02: qty 2

## 2016-03-02 MED ORDER — FLUORESCEIN SODIUM 10 % IJ SOLN
INTRAMUSCULAR | Status: AC
  Filled 2016-03-02: qty 5

## 2016-03-02 MED ORDER — DEXAMETHASONE SODIUM PHOSPHATE 10 MG/ML IJ SOLN
INTRAMUSCULAR | Status: AC
  Filled 2016-03-02: qty 1

## 2016-03-02 MED ORDER — CIPROFLOXACIN IN D5W 400 MG/200ML IV SOLN
400.0000 mg | INTRAVENOUS | Status: AC
  Administered 2016-03-02: 400 mg via INTRAVENOUS
  Filled 2016-03-02: qty 200

## 2016-03-02 MED ORDER — FLUORESCEIN SODIUM 10 % IJ SOLN
500.0000 mg | Freq: Once | INTRAMUSCULAR | Status: AC
  Administered 2016-03-02: 3 mL via INTRAVENOUS
  Filled 2016-03-02: qty 5

## 2016-03-02 MED ORDER — LIDOCAINE HCL (CARDIAC) 20 MG/ML IV SOLN
INTRAVENOUS | Status: DC | PRN
  Administered 2016-03-02: 60 mg via INTRAVENOUS

## 2016-03-02 MED ORDER — ESTRADIOL 0.1 MG/GM VA CREA
TOPICAL_CREAM | VAGINAL | Status: DC | PRN
  Administered 2016-03-02: 1 via VAGINAL

## 2016-03-02 MED ORDER — FENTANYL CITRATE (PF) 100 MCG/2ML IJ SOLN
INTRAMUSCULAR | Status: DC | PRN
  Administered 2016-03-02 (×8): 25 ug via INTRAVENOUS

## 2016-03-02 MED ORDER — BELLADONNA ALKALOIDS-OPIUM 16.2-60 MG RE SUPP
RECTAL | Status: DC | PRN
  Administered 2016-03-02: 1 via RECTAL

## 2016-03-02 MED ORDER — PROPOFOL 10 MG/ML IV BOLUS
INTRAVENOUS | Status: AC
  Filled 2016-03-02: qty 40

## 2016-03-02 MED ORDER — LACTATED RINGERS IV SOLN
INTRAVENOUS | Status: DC
  Administered 2016-03-02: 12:00:00 via INTRAVENOUS
  Filled 2016-03-02: qty 1000

## 2016-03-02 MED ORDER — METOCLOPRAMIDE HCL 5 MG/ML IJ SOLN
INTRAMUSCULAR | Status: DC | PRN
  Administered 2016-03-02: 5 mg via INTRAVENOUS

## 2016-03-02 MED ORDER — SODIUM CHLORIDE 0.9 % IR SOLN
Status: DC | PRN
  Administered 2016-03-02: 500 mL

## 2016-03-02 MED ORDER — LACTATED RINGERS IV SOLN
INTRAVENOUS | Status: DC
  Administered 2016-03-02 (×2): via INTRAVENOUS
  Filled 2016-03-02: qty 1000

## 2016-03-02 MED ORDER — DEXAMETHASONE SODIUM PHOSPHATE 4 MG/ML IJ SOLN
INTRAMUSCULAR | Status: DC | PRN
  Administered 2016-03-02: 10 mg via INTRAVENOUS

## 2016-03-02 MED ORDER — ACETAMINOPHEN 500 MG PO TABS
1000.0000 mg | ORAL_TABLET | Freq: Four times a day (QID) | ORAL | Status: DC | PRN
  Administered 2016-03-02: 1000 mg via ORAL
  Filled 2016-03-02: qty 2

## 2016-03-02 MED ORDER — CIPROFLOXACIN IN D5W 400 MG/200ML IV SOLN
INTRAVENOUS | Status: AC
  Filled 2016-03-02: qty 200

## 2016-03-02 MED ORDER — PROPOFOL 10 MG/ML IV BOLUS
INTRAVENOUS | Status: DC | PRN
  Administered 2016-03-02: 150 mg via INTRAVENOUS
  Administered 2016-03-02: 50 mg via INTRAVENOUS

## 2016-03-02 MED ORDER — HYDROCODONE-ACETAMINOPHEN 5-325 MG PO TABS: 1.0000 | ORAL_TABLET | ORAL | Status: DC | PRN

## 2016-03-02 MED ORDER — STERILE WATER FOR IRRIGATION IR SOLN
Status: DC | PRN
  Administered 2016-03-02: 3000 mL

## 2016-03-02 MED ORDER — MIDAZOLAM HCL 5 MG/5ML IJ SOLN
INTRAMUSCULAR | Status: DC | PRN
  Administered 2016-03-02: 2 mg via INTRAVENOUS

## 2016-03-02 MED ORDER — ROCURONIUM BROMIDE 100 MG/10ML IV SOLN
INTRAVENOUS | Status: AC
  Filled 2016-03-02: qty 1

## 2016-03-02 MED ORDER — FENTANYL CITRATE (PF) 100 MCG/2ML IJ SOLN
INTRAMUSCULAR | Status: AC
  Filled 2016-03-02: qty 2

## 2016-03-02 MED ORDER — HYDROMORPHONE HCL 1 MG/ML IJ SOLN
0.2500 mg | INTRAMUSCULAR | Status: DC | PRN
  Filled 2016-03-02: qty 1

## 2016-03-02 MED ORDER — PROMETHAZINE HCL 25 MG/ML IJ SOLN
6.2500 mg | INTRAMUSCULAR | Status: DC | PRN
  Filled 2016-03-02: qty 1

## 2016-03-02 MED ORDER — KETOROLAC TROMETHAMINE 30 MG/ML IJ SOLN
30.0000 mg | Freq: Once | INTRAMUSCULAR | Status: AC
  Administered 2016-03-02: 30 mg via INTRAVENOUS
  Filled 2016-03-02: qty 1

## 2016-03-02 MED ORDER — ARTIFICIAL TEARS OP OINT
TOPICAL_OINTMENT | OPHTHALMIC | Status: AC
  Filled 2016-03-02: qty 3.5

## 2016-03-02 MED ORDER — BUPIVACAINE-EPINEPHRINE 0.25% -1:200000 IJ SOLN
20.0000 mL | Freq: Once | INTRAMUSCULAR | Status: DC
  Filled 2016-03-02: qty 20

## 2016-03-02 MED ORDER — MIDAZOLAM HCL 2 MG/2ML IJ SOLN
INTRAMUSCULAR | Status: AC
  Filled 2016-03-02: qty 2

## 2016-03-02 MED ORDER — ONDANSETRON HCL 4 MG/2ML IJ SOLN
INTRAMUSCULAR | Status: AC
  Filled 2016-03-02: qty 2

## 2016-03-02 MED ORDER — METOCLOPRAMIDE HCL 5 MG/ML IJ SOLN
INTRAMUSCULAR | Status: AC
  Filled 2016-03-02: qty 2

## 2016-03-02 MED ORDER — LIDOCAINE HCL (CARDIAC) 20 MG/ML IV SOLN
INTRAVENOUS | Status: AC
  Filled 2016-03-02: qty 5

## 2016-03-02 SURGICAL SUPPLY — 64 items
ALLOGRAFT TUTOPLAST AXIS 6X12 (Tissue) ×2 IMPLANT
BAG URINE DRAINAGE (UROLOGICAL SUPPLIES) ×4 IMPLANT
BLADE CLIPPER SURG (BLADE) ×4 IMPLANT
BLADE SURG 10 STRL SS (BLADE) ×4 IMPLANT
BLADE SURG 15 STRL LF DISP TIS (BLADE) ×2 IMPLANT
BLADE SURG 15 STRL SS (BLADE) ×4
BOOTIES KNEE HIGH SLOAN (MISCELLANEOUS) ×4 IMPLANT
CANISTER SUCTION 1200CC (MISCELLANEOUS) IMPLANT
CANISTER SUCTION 2500CC (MISCELLANEOUS) ×8 IMPLANT
CATH FOLEY 2WAY SLVR  5CC 16FR (CATHETERS) ×2
CATH FOLEY 2WAY SLVR 5CC 16FR (CATHETERS) ×2 IMPLANT
COVER BACK TABLE 60X90IN (DRAPES) ×4 IMPLANT
COVER MAYO STAND STRL (DRAPES) ×4 IMPLANT
DEVICE CAPIO SUTURING (INSTRUMENTS) ×2
DEVICE CAPIO SUTURING OPC (INSTRUMENTS) ×2 IMPLANT
DISSECTOR ROUND CHERRY 3/8 STR (MISCELLANEOUS) ×4 IMPLANT
DRAPE UNDERBUTTOCKS STRL (DRAPE) ×4 IMPLANT
FLOSEAL 10ML (HEMOSTASIS) IMPLANT
GAUZE SPONGE 4X4 16PLY XRAY LF (GAUZE/BANDAGES/DRESSINGS) IMPLANT
GLOVE BIO SURGEON STRL SZ7.5 (GLOVE) ×8 IMPLANT
GLOVE BIOGEL M 6.5 STRL (GLOVE) ×4 IMPLANT
GLOVE BIOGEL PI IND STRL 6.5 (GLOVE) ×4 IMPLANT
GLOVE BIOGEL PI IND STRL 7.5 (GLOVE) ×4 IMPLANT
GLOVE BIOGEL PI INDICATOR 6.5 (GLOVE) ×4
GLOVE BIOGEL PI INDICATOR 7.5 (GLOVE) ×4
GOWN STRL REUS W/ TWL LRG LVL3 (GOWN DISPOSABLE) ×2 IMPLANT
GOWN STRL REUS W/ TWL XL LVL3 (GOWN DISPOSABLE) IMPLANT
GOWN STRL REUS W/TWL LRG LVL3 (GOWN DISPOSABLE) ×4
GOWN STRL REUS W/TWL XL LVL3 (GOWN DISPOSABLE) ×8 IMPLANT
KIT ROOM TURNOVER WOR (KITS) ×4 IMPLANT
LIQUID BAND (GAUZE/BANDAGES/DRESSINGS) IMPLANT
MANIFOLD NEPTUNE II (INSTRUMENTS) IMPLANT
NEEDLE 1/2 CIR CATGUT .05X1.09 (NEEDLE) ×4 IMPLANT
NEEDLE HYPO 22GX1.5 SAFETY (NEEDLE) ×8 IMPLANT
NS IRRIG 500ML POUR BTL (IV SOLUTION) ×4 IMPLANT
PACKING VAGINAL (PACKING) ×4 IMPLANT
PENCIL BUTTON HOLSTER BLD 10FT (ELECTRODE) ×4 IMPLANT
PLUG CATH AND CAP STER (CATHETERS) ×4 IMPLANT
RETRACTOR LONRSTAR 16.6X16.6CM (MISCELLANEOUS) ×2 IMPLANT
RETRACTOR STAY HOOK 5MM (MISCELLANEOUS) ×4 IMPLANT
RETRACTOR STER APS 16.6X16.6CM (MISCELLANEOUS) ×4
SET IRRIG Y TYPE TUR BLADDER L (SET/KITS/TRAYS/PACK) ×4 IMPLANT
SHEET LAVH (DRAPES) ×4 IMPLANT
SLING SOLYX SYSTEM SIS BX (SLING) IMPLANT
SPONGE LAP 4X18 X RAY DECT (DISPOSABLE) ×8 IMPLANT
SUCTION FRAZIER HANDLE 10FR (MISCELLANEOUS) ×2
SUCTION TUBE FRAZIER 10FR DISP (MISCELLANEOUS) ×2 IMPLANT
SUT ABS MONO DBL WITH NDL 48IN (SUTURE) IMPLANT
SUT CAPIO PGA 48IN SZ 0 (SUTURE) ×16 IMPLANT
SUT CAPIO POLYGLYCOLIC (SUTURE) IMPLANT
SUT ETHILON 2 0 PS N (SUTURE) IMPLANT
SUT MON AB 2-0 SH 27 (SUTURE)
SUT MON AB 2-0 SH27 (SUTURE) IMPLANT
SUT VIC AB 2-0 UR6 27 (SUTURE) ×52 IMPLANT
SYR BULB IRRIGATION 50ML (SYRINGE) ×4 IMPLANT
SYR CONTROL 10ML LL (SYRINGE) ×8 IMPLANT
SYRINGE 10CC LL (SYRINGE) ×4 IMPLANT
TRAY DSU PREP LF (CUSTOM PROCEDURE TRAY) ×4 IMPLANT
TUBE CONNECTING 12'X1/4 (SUCTIONS) ×2
TUBE CONNECTING 12X1/4 (SUCTIONS) ×6 IMPLANT
TUTOPLAST AXIS 6X12 (Tissue) ×4 IMPLANT
WATER STERILE IRR 3000ML UROMA (IV SOLUTION) ×4 IMPLANT
WATER STERILE IRR 500ML POUR (IV SOLUTION) ×4 IMPLANT
YANKAUER SUCT BULB TIP NO VENT (SUCTIONS) IMPLANT

## 2016-03-02 NOTE — Op Note (Deleted)
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  Surgeon:  Chauncey Cruel. Gaynelle Arabian, MD Resident: Jearld Adjutant, MD  Anesthesia:  General  EBL: 50 ml Fluids: see anesthesia record  Preparation: After appropriate preanesthesia, the patient was brought to the operating room, placed on the operating table in the dorsal supine position. She received pre-incision antibiotics. Timeout was observed.  Review history: 70 y.o. female with cystocele and mild vault prolapse with sensation of vaginal bulge here for repair.   Statement of  Likelihood of Success: Excellent if she is able to stay off her feet at work.   Procedure: [facility] retractor is placed, and Foley catheters placed.  Using a blue marking pen, the bladder neck is identified, the mid ureter is identified, and an area of incision in the cystocele is noted approximately 1 cm proximal to the urethrovaginal junction. This is marked horizontally with a blue pen. 40 cc of lidocaine 0.25% with marcaine 1-200,000 diluted in normal saline is then injected in the proposed incision site to serve as Hydro dissection to the level of the ischial spines, as well as for hemostasis.  Using a 15 blade, horizontal incision is made, and subcutaneous tissue dissected to the level of the ischial spines. The pelvic floor dissected, the ischial spines identified bilaterally, and the sacrospinous ligaments defined. There is a large amount of scar from prior hysterectomy it is dissected. A standard Kelly plication is accomplished, using 2-0 Vicryl sutures, placed in interrupted horizontal mattress fashion.  The 6 x 12 cm Coloplast dermis graft is then selected, and, using the Cappio device, proline sutures were placed through the sacrospinous ligaments bilaterally. Sutures were  placed greater than one finger breath medial to the ischial spines bilaterally, and their location is palpated for accuracy. Following this, the mesh is advanced slightly, and then sutured in place, with 3 separate 2-0 Vicryl sutures in the apex, and lateralward bilaterally. The mesh is then advanced through the sacrospinous ligament, and the distal portion of the mesh is sutured with 3 separate sutures, again one midline, and 2 lateral sutures after being tailored to the length of her defect. No bleeding is noted. Wound was irrigated with antibiotic irrigation.   At this point we performed cystoscopy. The patient had been given 3 cc's of fluorescein prior to cysto and we were able to see fluorescein jets from the UOs bilaterally. There was no suture in or injury to the bladder or urethra.  The Foley catheter was replaced.   The wound edges are freshened, and closed with running 2-0 Vicryl suture. Vaginal packing is placed using estrogen cream and Vag Pack, and Foley was left to straight drainage. The patient received IV Toradol, was awakened, and taken to recovery room in good condition.  Plan: The patient will be discharged home and her foley and packing will be removed tomorrow in clinic.

## 2016-03-02 NOTE — Anesthesia Procedure Notes (Signed)
Procedure Name: LMA Insertion Date/Time: 03/02/2016 8:26 AM Performed by: Mechele Claude Pre-anesthesia Checklist: Patient identified, Emergency Drugs available, Suction available and Patient being monitored Patient Re-evaluated:Patient Re-evaluated prior to inductionOxygen Delivery Method: Circle System Utilized Preoxygenation: Pre-oxygenation with 100% oxygen Intubation Type: IV induction Ventilation: Mask ventilation without difficulty LMA: LMA inserted LMA Size: 4.0 Number of attempts: 1 Airway Equipment and Method: bite block Placement Confirmation: positive ETCO2 Tube secured with: Tape Dental Injury: Teeth and Oropharynx as per pre-operative assessment

## 2016-03-02 NOTE — Transfer of Care (Signed)
Last Vitals:  Filed Vitals:   03/02/16 0647 03/02/16 1147  BP: 133/72 137/73  Pulse: 68 100  Temp: 36.7 C 36.8 C  Resp: 16 10    Immediate Anesthesia Transfer of Care Note  Patient: Cathy [name]  Procedure(s) Performed: Procedure(s) (LRB): COLOPLAST ANTERIOR  VAULT REPAIR WITH AXIS DERMIS. SACROSPINUS FIXATION, AUGMENTATION WITH AXIS DERMIS (N/A)  Patient Location: PACU  Anesthesia Type: General  Level of Consciousness: awake, alert  and oriented  Airway & Oxygen Therapy: Patient Spontanous Breathing and Patient connected to nasal cannula oxygen  Post-op Assessment: Report given to PACU RN and Post -op Vital signs reviewed and stable  Post vital signs: Reviewed and stable  Complications: No apparent anesthesia complications

## 2016-03-02 NOTE — Discharge Instructions (Signed)
°  Post Anesthesia Home Care Instructions  Activity: Get plenty of rest for the remainder of the day. A responsible adult should stay with you for 24 hours following the procedure.  For the next 24 hours, DO NOT: -Drive a car -Paediatric nurse -Drink alcoholic beverages -Take any medication unless instructed by your physician -Make any legal decisions or sign important papers.  Meals: Start with liquid foods such as gelatin or soup. Progress to regular foods as tolerated. Avoid greasy, spicy, heavy foods. If nausea and/or vomiting occur, drink only clear liquids until the nausea and/or vomiting subsides. Call your physician if vomiting continues.  Special Instructions/Symptoms: Your throat may feel dry or sore from the anesthesia or the breathing tube placed in your throat during surgery. If this causes discomfort, gargle with warm salt water. The discomfort should disappear within 24 hours.  If you had a scopolamine patch placed behind your ear for the management of post- operative nausea and/or vomiting:  1. The medication in the patch is effective for 72 hours, after which it should be removed.  Wrap patch in a tissue and discard in the trash. Wash hands thoroughly with soap and water. 2. You may remove the patch earlier than 72 hours if you experience unpleasant side effects which may include dry mouth, dizziness or visual disturbances. 3. Avoid touching the patch. Wash your hands with soap and water after contact with the patch.   HOME CARE INSTRUCTIONS FOR VAGINAL SLING  Activity:  -No lifting greater than 10-15 pounds for 1 week, or as instructed by yourphysician.             -No sexual intercourse until your f/u visit Diet:  You may return to your normal diet tomorrow.   It is important to keep your bowels regular during the postoperative period.  To avoid constipation, drink plenty of fluids during the day (8-10 glasses) and eat plenty of fresh fruits and vegetables.  Use a mild  laxative or stool softener if necessary.  Wound Care:  You may begin showering tomorrow, or as instructed by your physician.      Return to Work as instructed by your physician.    Special Instructions:   Call your physician if any of these symptoms occur:   -temperature greater than 101 degrees Farenheit.   -redness, swelling or drainage at incision site.   -foul odor of your urine.   -a significant decrease in the amount of urine you have every day.   -severe pain not relieved by your pain medication.    Return to see your doctor .   Call to set up a follow-up appointment.   Patient Signature:  ________________________________________________________  Nurse's Signature:  ________________________________________________________

## 2016-03-02 NOTE — Interval H&P Note (Signed)
History and Physical Interval Note:  03/02/2016 8:17 AM  Cathy [name]  has presented today for surgery, with the diagnosis of CYSTOCELE  The various methods of treatment have been discussed with the patient and family. After consideration of risks, benefits and other options for treatment, the patient has consented to  Procedure(s) with comments: ANTERIOR REPAIR (CYSTOCELE) (N/A) - 2 HOURS REQUESTED  **COLOPLAST ANTERIOR VAULT REPAIR WITH AXIS DERMIS, SACROSPINOUS FIXATION**  [phone] HOME [phone] CELL  [facility] ID [id number]  REP [place] DAVIS TO BE PRESENT FOR CASE [phone]  as a surgical intervention .  The patient's history has been reviewed, patient examined, no change in status, stable for surgery.  I have reviewed the patient's chart and labs.  Questions were answered to the patient's satisfaction.     Lisl Slingerland I Kawon Willcutt  S/O: POP: in need of apical suspension. Pt denies SUI. Neg. Marshall test, even with POP replaced into anatomic position.  A: Will proceed with sacrospinus fixation, augmented anterior vault repair with AXIS dermis remair as discussed in office. She is insistent on outpatient status, but will RTC for cathe and packing removal in AM.  P: Surgery today       RTC for packing and foley out       No prolonged standing at work       Avoid constipation       Possible future SUI surgery.

## 2016-03-02 NOTE — H&P (Signed)
Reason For Visit Cystocele   Active Problems Problems  1. Cystocele, midline (N81.11) 2. Recurrent urinary tract infection (N39.0) 3. Urinary urgency (R39.15)  History of Present Illness     70 yo female, Type II, referred by Dr. Edsel Petrin, DO for further evaluation and management of a cystocele. She recently noted a bulge in her vagina, 2 months ago. Buldge comes down almost to the opening of the vagina.    . She has had recent E. Coli UTI, treated with nitrofurantoin bid. Also a hx of MRSA.  She had an abdominal hysterectomy, 35 yrs ago. She has a hx tobacco use She smoked < 34 ys, and none in last 20 yrs.   Past Medical History Problems  1. History of depression (Z86.59) 2. History of esophageal reflux (Z87.19) 3. History of hyperlipidemia (Z86.39) 4. History of hypertension (Z86.79) 5. History of MRSA infection (Z86.14) 6. History of osteoarthritis (Z87.39) 7. History of type 2 diabetes mellitus (Z86.39)  Surgical History Problems  1. History of Appendectomy 2. History of Biopsy Breast Open 3. History of Inguinal Hernia Repair 4. History of Total Abdominal Hysterectomy 5. History of Tubal Ligation  Current Meds 1. Acetaminophen 325 MG Oral Tablet;  Therapy: (Recorded:10Mar2017) to Recorded 2. Aspirin 81 MG TABS;  Therapy: (Recorded:10Mar2017) to Recorded 3. Caltrate 600+D TABS;  Therapy: (Recorded:10Mar2017) to Recorded 4. Cranberry TABS;  Therapy: (Recorded:10Mar2017) to Recorded 5. Garlic TABS;  Therapy: (Recorded:10Mar2017) to Recorded 6. Lisinopril 10 MG Oral Tablet;  Therapy: 13Oct2016 to Recorded 7. MetFORMIN HCl - 1000 MG Oral Tablet;  Therapy: [id number] to Recorded 8. Multivitamins CAPS;  Therapy: (Recorded:10Mar2017) to Recorded 9. Omega 3 1000 MG Oral Capsule;  Therapy: (Recorded:10Mar2017) to Recorded 10. OneTouch Verio STRP;   Therapy: (Recorded:10Mar2017) to Recorded 11. Simvastatin 40 MG Oral Tablet;   Therapy: [id number] to Recorded 12.  Turmeric Curcumin CAPS;   Therapy: (Recorded:10Mar2017) to Recorded  Allergies Medication  1. Penicillins  Family History Problems  1. Family history of Aneurysm : Mother 2. Family history of diabetes mellitus (Z83.3) : Grandmother 3. Family history of liver cancer (Z80.0) : Father  Social History Problems    Alcohol use (Z78.9)   Occ. 2-3 months   Former smoker [phone]   quit in 2007   Number of children   2   Occupation   H[employer] [place], constitutional, skin, eye, otolaryngeal, hematologic/lymphatic, cardiovascular, pulmonary, endocrine, musculoskeletal, gastrointestinal, neurological and psychiatric system(s) were reviewed and pertinent findings if present are noted and are otherwise negative.  Genitourinary: urinary frequency, dysuria, nocturia and vaginal lump or mass.  Gastrointestinal: constipation.  ENT: sinus problems.  Hematologic/Lymphatic: a tendency to easily bruise.  Musculoskeletal: joint pain.    Vitals Vital Signs [Data Includes: Last 1 Day]  Recorded: [id number] 03:10PM  Height: 5 ft 3 in Weight: 136 lb  BMI Calculated: 24.09 BSA Calculated: 1.64 Blood Pressure: 135 / 88 Temperature: 97.1 F Heart Rate: 71  Physical Exam Constitutional: Well nourished and well developed . No acute distress.  ENT:. The ears and nose are normal in appearance.  Neck: The appearance of the neck is normal and no neck mass is present.  Pulmonary: No respiratory distress and normal respiratory rhythm and effort.  Cardiovascular: Heart rate and rhythm are normal . No peripheral edema.  Abdomen: The abdomen is flat. The abdomen is soft and nontender. No masses are palpated. No CVA tenderness. No hernias are palpable. No hepatosplenomegaly noted.  Genitourinary:. Marshall Neg. Neg Q-tip  test.  Chaperone Present: kim lewis.  Examination of the external genitalia shows normal female external genitalia, no vulvar  atrophy and no condyloma acuminatum. The urethra is normal in appearance. There is no urethral mass. Urethral hypermobility is not present. There is no urethral prolapse. Vaginal exam demonstrates no abnormalities and no uterine prolapse. A cystocele is present with a midline defect (grade 3 /4). The cervix is is absent. The uterus is absent.  Lymphatics: The femoral and inguinal nodes are not enlarged or tender.  Skin: Normal skin turgor, no visible rash and no visible skin lesions.  Neuro/Psych:. Mood and affect are appropriate.    Results/Data Urine [Data Includes: Last 1 Day]   [id number]  COLOR YELLOW   APPEARANCE CLEAR   SPECIFIC GRAVITY 1.010   pH 5.5   GLUCOSE NEGATIVE   BILIRUBIN NEGATIVE   KETONE NEGATIVE   BLOOD NEGATIVE   PROTEIN NEGATIVE   NITRITE NEGATIVE   LEUKOCYTE ESTERASE NEGATIVE    Procedure Ruthann Cancer test: PVR -100cc and instilled 200cc of sterile water. POP-Q test: Aa0 B-1 C-6 gh 4 Ap-1Bp-2 tvl 7     Assessment Assessed  1. Urinary urgency (R39.15) 2. Recurrent urinary tract infection (N39.0) 3. Cystocele, midline (N81.11)  70 yo female with cystocele. She wants to have repair, but only as an outpatient. She will have augmented anterior vault repair, but no need for sling. She could RTC for a sling in the future if necessary.   Plan Cystocele, midline, Recurrent urinary tract infection  1. Marshall Test; Status:Hold For - Appointment,Date of Service; Requested for:10Mar2017;   RTC for surgery prn.   Schedule surgery as op/.   Discussion/Summary cc: Edsel Petrin, DO     Signatures Electronically signed by : Carolan Clines, M.D.; Jan 31 2016  5:52PM EST

## 2016-03-02 NOTE — Anesthesia Preprocedure Evaluation (Addendum)
Anesthesia Evaluation  Patient identified by MRN, date of birth, ID band Patient awake    Reviewed: Allergy & Precautions, NPO status , Patient's Chart, lab work & pertinent test results  Airway Mallampati: I  TM Distance: >3 FB Neck ROM: Full    Dental  (+) Chipped,    Pulmonary former smoker,    breath sounds clear to auscultation       Cardiovascular hypertension, Pt. on medications  Rhythm:Regular Rate:Normal     Neuro/Psych PSYCHIATRIC DISORDERS Depression  Neuromuscular disease    GI/Hepatic Neg liver ROS, GERD  ,  Endo/Other  diabetes, Type 2, Oral Hypoglycemic Agents  Renal/GU negative Renal ROS     Musculoskeletal  (+) Arthritis , Osteoarthritis,    Abdominal Normal abdominal exam  (+)   Peds negative pediatric ROS (+)  Hematology   Anesthesia Other Findings - HLD  Reproductive/Obstetrics negative OB ROS                           Anesthesia Physical Anesthesia Plan  ASA: II  Anesthesia Plan: General   Post-op Pain Management:    Induction: Intravenous  Airway Management Planned: LMA  Additional Equipment:   Intra-op Plan:   Post-operative Plan: Extubation in OR  Informed Consent: I have reviewed the patients History and Physical, chart, labs and discussed the procedure including the risks, benefits and alternatives for the proposed anesthesia with the patient or authorized representative who has indicated his/her understanding and acceptance.   Dental advisory given  Plan Discussed with: CRNA  Anesthesia Plan Comments:        Anesthesia Quick Evaluation

## 2016-03-02 NOTE — Anesthesia Postprocedure Evaluation (Signed)
Anesthesia Post Note  Patient: Cathy [name]  Procedure(s) Performed: Procedure(s) (LRB): COLOPLAST ANTERIOR  VAULT REPAIR WITH AXIS DERMIS. SACROSPINUS FIXATION, AUGMENTATION WITH AXIS DERMIS (N/A)  Patient location during evaluation: PACU Anesthesia Type: General Level of consciousness: awake and alert Pain management: pain level controlled Vital Signs Assessment: post-procedure vital signs reviewed and stable Respiratory status: spontaneous breathing, nonlabored ventilation, respiratory function stable and patient connected to nasal cannula oxygen Cardiovascular status: blood pressure returned to baseline and stable Postop Assessment: no signs of nausea or vomiting Anesthetic complications: no    Last Vitals:  Filed Vitals:   03/02/16 1200 03/02/16 1215  BP: 127/74 127/78  Pulse: 100 97  Temp:    Resp: 13 19    Last Pain:  Filed Vitals:   03/02/16 1216  PainSc: 0-No pain                 Effie Berkshire

## 2016-03-03 ENCOUNTER — Encounter (HOSPITAL_BASED_OUTPATIENT_CLINIC_OR_DEPARTMENT_OTHER): Payer: Self-pay | Admitting: Urology

## 2016-03-12 ENCOUNTER — Other Ambulatory Visit: Payer: Self-pay | Admitting: *Deleted

## 2016-03-12 DIAGNOSIS — N390 Urinary tract infection, site not specified: Secondary | ICD-10-CM | POA: Diagnosis not present

## 2016-03-12 DIAGNOSIS — M25559 Pain in unspecified hip: Secondary | ICD-10-CM | POA: Diagnosis not present

## 2016-03-12 DIAGNOSIS — Z0000 Encounter for general adult medical examination without abnormal findings: Secondary | ICD-10-CM | POA: Diagnosis not present

## 2016-03-12 MED ORDER — LISINOPRIL 10 MG PO TABS: 5.0000 mg | ORAL_TABLET | Freq: Every day | ORAL | Status: DC

## 2016-03-19 NOTE — Op Note (Signed)
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 Gaynelle Arabian, MD Resident: Jearld Adjutant, MD  Anesthesia:  General  EBL: 50 ml Fluids: see anesthesia record  Preparation: After appropriate preanesthesia, the patient was brought to the operating room, placed on the operating table in the dorsal supine position. She received pre-incision antibiotics. Timeout was observed.  Review history: 70 y.o. female with cystocele and mild vault prolapse with sensation of vaginal bulge here for repair.   Statement of  Likelihood of Success: Excellent if she is able to stay off her feet at work.   Procedure: The [facility] retractor is placed, and Foley catheters placed.  Using a blue marking pen, the bladder neck is identified, the mid ureter is identified, and an area of incision in the cystocele is noted approximately 1 cm proximal to the urethrovaginal junction. This is marked horizontally with a blue pen. 40 cc of lidocaine 0.25% with marcaine 1-200,000 diluted in normal saline is then injected in the proposed incision site to serve as Hydro dissection to the level of the ischial spines, as well as for hemostasis.  Using a 15 blade, horizontal incision is made, and subcutaneous tissue dissected to the level of the ischial spines. The pelvic floor dissected, the ischial spines identified bilaterally, and the sacrospinous ligaments defined. There is a large amount of scar from prior hysterectomy it is dissected. A standard Kelly plication is accomplished, using 2-0 Vicryl sutures, placed in interrupted horizontal mattress fashion.  The 6 x 12 cm Coloplast dermis graft is then selected, and, using the Cappio device, proline sutures were placed through the sacrospinous ligaments bilaterally. Sutures were placed greater than one finger  breath medial to the ischial spines bilaterally, and their location is palpated for accuracy. Following this, the mesh is advanced slightly, and then sutured in place, with 3 separate 2-0 Vicryl sutures in the apex, and lateralward bilaterally. The mesh is then advanced through the sacrospinous ligament, and the distal portion of the mesh is sutured with 3 separate sutures, again one midline, and 2 lateral sutures after being tailored to the length of her defect. No bleeding is noted. Wound was irrigated with antibiotic irrigation.   At this point we performed cystoscopy. The patient had been given 3 cc's of fluorescein prior to cysto and we were able to see fluorescein jets from the UOs bilaterally. There was no suture in or injury to the bladder or urethra.  The Foley catheter was replaced.   The wound edges are freshened, and closed with running 2-0 Vicryl suture. Vaginal packing is placed using estrogen cream and Vag Pack, and Foley was left to straight drainage. The patient received IV Toradol, was awakened, and taken to recovery room in good condition.  Plan: The patient will be discharged home and her foley and packing will be removed tomorrow in clinic.

## 2016-03-25 DIAGNOSIS — Z0000 Encounter for general adult medical examination without abnormal findings: Secondary | ICD-10-CM | POA: Diagnosis not present

## 2016-04-13 ENCOUNTER — Other Ambulatory Visit: Payer: Self-pay

## 2016-04-13 DIAGNOSIS — E1121 Type 2 diabetes mellitus with diabetic nephropathy: Secondary | ICD-10-CM

## 2016-04-13 MED ORDER — GLUCOSE BLOOD VI STRP: ORAL_STRIP | Status: DC

## 2016-04-13 MED ORDER — ONETOUCH DELICA LANCETS FINE MISC: Status: DC

## 2016-04-14 ENCOUNTER — Telehealth: Payer: Self-pay | Admitting: Family Medicine

## 2016-04-14 NOTE — Telephone Encounter (Signed)
Received a pa request for OneTouch Verio test strip.  I could proceed with pa or pt could call to see what is covered by her insurance.  Those strips should not have been prescribed due to a "special cause."

## 2016-04-16 NOTE — Telephone Encounter (Signed)
Pt states she talked  with [employer] and we need to do PA . Pt does not want to change glucometer

## 2016-04-16 NOTE — Telephone Encounter (Signed)
PA submitted for One Touch Verio test strips. Will await determination.

## 2016-04-16 NOTE — Telephone Encounter (Signed)
Brooke, please look into this and do PA if can. Thanks

## 2016-04-16 NOTE — Telephone Encounter (Signed)
Spoke to pt, told her need to contact insurance and find out what glucometer is covered so we can order correct one and supplies for you. One touch is not covered and call back and let us know. Pt verbalized understanding.

## 2016-04-17 NOTE — Telephone Encounter (Signed)
Pt called back.  Relayed information. Pt does not want to change meters.  Advised pt one could be   provided to her that insurance will cover, but pt then hung up on me.

## 2016-04-17 NOTE — Telephone Encounter (Signed)
Spoke with pt she contact her insurance company to see which meter they cover

## 2016-04-17 NOTE — Telephone Encounter (Signed)
PA for denied.  Patient can either pay for the strips out of her pocket or change to the glucometer that her insurance will cover.  Left vm to pt to return our call so we can notify her of this information

## 2016-05-14 ENCOUNTER — Other Ambulatory Visit: Payer: Self-pay

## 2016-05-14 MED ORDER — SIMVASTATIN 40 MG PO TABS: ORAL_TABLET | ORAL | Status: DC

## 2016-05-15 ENCOUNTER — Telehealth: Payer: Self-pay | Admitting: Internal Medicine

## 2016-05-15 ENCOUNTER — Other Ambulatory Visit: Payer: Self-pay

## 2016-05-15 MED ORDER — METFORMIN HCL 1000 MG PO TABS: 1000.0000 mg | ORAL_TABLET | Freq: Two times a day (BID) | ORAL | Status: DC

## 2016-05-15 NOTE — Telephone Encounter (Signed)
It was sent this morning at 8:01am.

## 2016-05-15 NOTE — Telephone Encounter (Signed)
Pt request refill  metFORMIN (GLUCOPHAGE) 1000 MG tablet  [facility]  Pt has CPE on 8/09.  But is out of her meds.

## 2016-06-07 ENCOUNTER — Emergency Department (HOSPITAL_COMMUNITY): Payer: Commercial Managed Care - HMO

## 2016-06-07 ENCOUNTER — Emergency Department (HOSPITAL_COMMUNITY)
Admission: EM | Admit: 2016-06-07 | Discharge: 2016-06-07 | Disposition: A | Discharge status: Home / Self Care | Payer: Commercial Managed Care - HMO | Attending: Emergency Medicine | Admitting: Emergency Medicine

## 2016-06-07 ENCOUNTER — Encounter (HOSPITAL_COMMUNITY): Payer: Self-pay | Admitting: *Deleted

## 2016-06-07 DIAGNOSIS — M542 Cervicalgia: Secondary | ICD-10-CM | POA: Diagnosis not present

## 2016-06-07 DIAGNOSIS — M25562 Pain in left knee: Secondary | ICD-10-CM | POA: Insufficient documentation

## 2016-06-07 DIAGNOSIS — I10 Essential (primary) hypertension: Secondary | ICD-10-CM | POA: Insufficient documentation

## 2016-06-07 DIAGNOSIS — F329 Major depressive disorder, single episode, unspecified: Secondary | ICD-10-CM | POA: Insufficient documentation

## 2016-06-07 DIAGNOSIS — Y9389 Activity, other specified: Secondary | ICD-10-CM | POA: Diagnosis not present

## 2016-06-07 DIAGNOSIS — Y92410 Unspecified street and highway as the place of occurrence of the external cause: Secondary | ICD-10-CM | POA: Diagnosis not present

## 2016-06-07 DIAGNOSIS — E785 Hyperlipidemia, unspecified: Secondary | ICD-10-CM | POA: Insufficient documentation

## 2016-06-07 DIAGNOSIS — S8992XA Unspecified injury of left lower leg, initial encounter: Secondary | ICD-10-CM | POA: Diagnosis not present

## 2016-06-07 DIAGNOSIS — Y999 Unspecified external cause status: Secondary | ICD-10-CM | POA: Diagnosis not present

## 2016-06-07 DIAGNOSIS — Z87891 Personal history of nicotine dependence: Secondary | ICD-10-CM | POA: Diagnosis not present

## 2016-06-07 DIAGNOSIS — Z79899 Other long term (current) drug therapy: Secondary | ICD-10-CM | POA: Diagnosis not present

## 2016-06-07 DIAGNOSIS — Z7982 Long term (current) use of aspirin: Secondary | ICD-10-CM | POA: Diagnosis not present

## 2016-06-07 DIAGNOSIS — E119 Type 2 diabetes mellitus without complications: Secondary | ICD-10-CM | POA: Diagnosis not present

## 2016-06-07 DIAGNOSIS — Z7984 Long term (current) use of oral hypoglycemic drugs: Secondary | ICD-10-CM | POA: Insufficient documentation

## 2016-06-07 MED ORDER — NAPROXEN 500 MG PO TABS: 500.0000 mg | ORAL_TABLET | Freq: Two times a day (BID) | ORAL | Status: DC

## 2016-06-07 MED ORDER — CYCLOBENZAPRINE HCL 5 MG PO TABS: 5.0000 mg | ORAL_TABLET | Freq: Two times a day (BID) | ORAL | Status: DC | PRN

## 2016-06-07 MED ORDER — KETOROLAC TROMETHAMINE 60 MG/2ML IM SOLN
30.0000 mg | Freq: Once | INTRAMUSCULAR | Status: AC
  Administered 2016-06-07: 30 mg via INTRAMUSCULAR
  Filled 2016-06-07: qty 2

## 2016-06-07 MED ORDER — OXYCODONE-ACETAMINOPHEN 5-325 MG PO TABS: 1.0000 | ORAL_TABLET | Freq: Four times a day (QID) | ORAL | Status: DC | PRN

## 2016-06-07 NOTE — ED Notes (Signed)
Ortho Tech notified of knee sleeve need.

## 2016-06-07 NOTE — ED Notes (Signed)
Discharge instructions, follow up care, and rx x1 reviewed with patient. Patient verbalized understanding. 
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
## 2016-06-07 NOTE — ED Notes (Signed)
Patient ambulatory from triage
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
## 2016-06-07 NOTE — ED Notes (Signed)
Patient transported to X-ray 

## 2016-06-07 NOTE — ED Notes (Signed)
PA at bedside.
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
## 2016-06-11 ENCOUNTER — Telehealth: Payer: Self-pay | Admitting: Internal Medicine

## 2016-06-11 NOTE — Telephone Encounter (Signed)
Pt need new Rx for lisinopril   Pharm:  [facility]

## 2016-06-12 MED ORDER — LISINOPRIL 10 MG PO TABS: 5.0000 mg | ORAL_TABLET | Freq: Every day | ORAL | Status: DC

## 2016-06-12 NOTE — Telephone Encounter (Signed)
Pt calling for Rx status she is out and have gotten a loaner of 2 days from the pharmacy.

## 2016-06-12 NOTE — Telephone Encounter (Signed)
Patient establishing with [name] next month. Cory approved Rx. Rx sent in.

## 2016-06-24 ENCOUNTER — Other Ambulatory Visit: Payer: Medicare HMO

## 2016-06-24 ENCOUNTER — Other Ambulatory Visit (INDEPENDENT_AMBULATORY_CARE_PROVIDER_SITE_OTHER): Payer: Commercial Managed Care - HMO

## 2016-06-24 DIAGNOSIS — R319 Hematuria, unspecified: Secondary | ICD-10-CM | POA: Diagnosis not present

## 2016-06-24 DIAGNOSIS — Z0000 Encounter for general adult medical examination without abnormal findings: Secondary | ICD-10-CM

## 2016-06-24 LAB — HEMOGLOBIN A1C: Hgb A1c MFr Bld: 6.2 % (ref 4.6–6.5)

## 2016-06-24 LAB — CBC WITH DIFFERENTIAL/PLATELET
BASOS ABS: 0.1 10*3/uL (ref 0.0–0.1)
Basophils Relative: 0.6 % (ref 0.0–3.0)
EOS ABS: 0.2 10*3/uL (ref 0.0–0.7)
Eosinophils Relative: 2.7 % (ref 0.0–5.0)
HEMATOCRIT: 39.1 % (ref 36.0–46.0)
HEMOGLOBIN: 12.6 g/dL (ref 12.0–15.0)
LYMPHS PCT: 26.9 % (ref 12.0–46.0)
Lymphs Abs: 2.3 10*3/uL (ref 0.7–4.0)
MCHC: 32.3 g/dL (ref 30.0–36.0)
MCV: 77.6 fl — AB (ref 78.0–100.0)
MONOS PCT: 5.5 % (ref 3.0–12.0)
Monocytes Absolute: 0.5 10*3/uL (ref 0.1–1.0)
Neutro Abs: 5.6 10*3/uL (ref 1.4–7.7)
Neutrophils Relative %: 64.3 % (ref 43.0–77.0)
Platelets: 301 10*3/uL (ref 150.0–400.0)
RBC: 5.04 Mil/uL (ref 3.87–5.11)
RDW: 15.3 % (ref 11.5–15.5)
WBC: 8.7 10*3/uL (ref 4.0–10.5)

## 2016-06-24 LAB — HEPATIC FUNCTION PANEL
ALBUMIN: 4.2 g/dL (ref 3.5–5.2)
ALK PHOS: 59 U/L (ref 39–117)
ALT: 11 U/L (ref 0–35)
AST: 16 U/L (ref 0–37)
BILIRUBIN DIRECT: 0.1 mg/dL (ref 0.0–0.3)
TOTAL PROTEIN: 6.9 g/dL (ref 6.0–8.3)
Total Bilirubin: 0.5 mg/dL (ref 0.2–1.2)

## 2016-06-24 LAB — LIPID PANEL
CHOL/HDL RATIO: 2
Cholesterol: 173 mg/dL (ref 0–200)
HDL: 80 mg/dL (ref >39.00)
LDL Cholesterol: 82 mg/dL (ref 0–99)
NONHDL: 92.64
Triglycerides: 55 mg/dL (ref 0.0–149.0)
VLDL: 11 mg/dL (ref 0.0–40.0)

## 2016-06-24 LAB — POC URINALSYSI DIPSTICK (AUTOMATED)
Bilirubin, UA: NEGATIVE
GLUCOSE UA: NEGATIVE
Ketones, UA: NEGATIVE
NITRITE UA: POSITIVE
PH UA: 8.5
SPEC GRAV UA: 1.025
UROBILINOGEN UA: 0.2

## 2016-06-24 LAB — BASIC METABOLIC PANEL
BUN: 14 mg/dL (ref 6–23)
CALCIUM: 9.6 mg/dL (ref 8.4–10.5)
CHLORIDE: 101 meq/L (ref 96–112)
CO2: 29 meq/L (ref 19–32)
CREATININE: 0.81 mg/dL (ref 0.40–1.20)
GFR: 90.52 mL/min (ref >60.00)
Glucose, Bld: 87 mg/dL (ref 70–99)
Potassium: 4.1 mEq/L (ref 3.5–5.1)
SODIUM: 139 meq/L (ref 135–145)

## 2016-06-24 LAB — MICROALBUMIN / CREATININE URINE RATIO
CREATININE, U: 122.6 mg/dL
MICROALB UR: 1.8 mg/dL (ref 0.0–1.9)
MICROALB/CREAT RATIO: 1.5 mg/g (ref 0.0–30.0)

## 2016-06-24 LAB — URINALYSIS, MICROSCOPIC ONLY

## 2016-06-24 LAB — TSH: TSH: 0.86 u[IU]/mL (ref 0.35–4.50)

## 2016-06-27 LAB — URINE CULTURE: Colony Count: >=100000

## 2016-06-30 ENCOUNTER — Other Ambulatory Visit: Payer: Self-pay | Admitting: Adult Health

## 2016-06-30 MED ORDER — CIPROFLOXACIN HCL 500 MG PO TABS: 500.0000 mg | ORAL_TABLET | Freq: Two times a day (BID) | ORAL | 0 refills | Status: DC

## 2016-07-01 ENCOUNTER — Ambulatory Visit (INDEPENDENT_AMBULATORY_CARE_PROVIDER_SITE_OTHER): Payer: Commercial Managed Care - HMO | Admitting: Adult Health

## 2016-07-01 DIAGNOSIS — E785 Hyperlipidemia, unspecified: Secondary | ICD-10-CM

## 2016-07-01 DIAGNOSIS — Z0000 Encounter for general adult medical examination without abnormal findings: Secondary | ICD-10-CM

## 2016-07-01 DIAGNOSIS — E118 Type 2 diabetes mellitus with unspecified complications: Secondary | ICD-10-CM

## 2016-07-01 DIAGNOSIS — I10 Essential (primary) hypertension: Secondary | ICD-10-CM | POA: Diagnosis not present

## 2016-07-01 NOTE — Patient Instructions (Signed)
It was great meeting you today   Your exam was great! Keep up the good work  I have sent in Cathy [name] for your UTI   You can stop taking Metformin for the time being, please follow up with me in 3 months to recheck.   Try using Flonase for your allergies
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
## 2016-07-08 DIAGNOSIS — N393 Stress incontinence (female) (male): Secondary | ICD-10-CM | POA: Diagnosis not present

## 2016-07-08 DIAGNOSIS — N8111 Cystocele, midline: Secondary | ICD-10-CM | POA: Diagnosis not present

## 2016-08-24 ENCOUNTER — Telehealth: Payer: Self-pay | Admitting: Adult Health

## 2016-08-24 NOTE — Telephone Encounter (Signed)
I called the pt and advised her if she feels she has an infection, she would need to be seen in the office and offered an appt today with someone else since [place] if not here today.  Patient stated she cannot come in today and will call back tomorrow and she hung up.  Message sent to [facility] as FYI.

## 2016-08-24 NOTE — Telephone Encounter (Signed)
° °  Pt said she think she has a bladder infection and would like to go to [facility] to give a urine sample. Can she go there.

## 2016-08-28 DIAGNOSIS — R3915 Urgency of urination: Secondary | ICD-10-CM | POA: Diagnosis not present

## 2016-08-28 DIAGNOSIS — R350 Frequency of micturition: Secondary | ICD-10-CM | POA: Diagnosis not present

## 2016-09-04 ENCOUNTER — Other Ambulatory Visit: Payer: Self-pay | Admitting: Adult Health

## 2016-09-18 ENCOUNTER — Other Ambulatory Visit: Payer: Self-pay | Admitting: Family Medicine

## 2016-09-22 NOTE — Telephone Encounter (Signed)
Ok to refill for one year  

## 2016-10-06 ENCOUNTER — Other Ambulatory Visit: Payer: Self-pay | Admitting: Adult Health

## 2016-10-06 ENCOUNTER — Other Ambulatory Visit: Payer: Self-pay | Admitting: Internal Medicine

## 2016-10-06 DIAGNOSIS — Z1231 Encounter for screening mammogram for malignant neoplasm of breast: Secondary | ICD-10-CM

## 2016-10-22 ENCOUNTER — Ambulatory Visit (INDEPENDENT_AMBULATORY_CARE_PROVIDER_SITE_OTHER): Payer: Commercial Managed Care - HMO | Admitting: Adult Health

## 2016-10-22 ENCOUNTER — Encounter: Payer: Self-pay | Admitting: Adult Health

## 2016-10-22 VITALS — BP 118/68 | Temp 98.2°F | Ht 66.0 in | Wt 151.2 lb

## 2016-10-22 DIAGNOSIS — E118 Type 2 diabetes mellitus with unspecified complications: Secondary | ICD-10-CM

## 2016-10-22 DIAGNOSIS — R350 Frequency of micturition: Secondary | ICD-10-CM | POA: Diagnosis not present

## 2016-10-22 LAB — POCT URINALYSIS DIPSTICK
BILIRUBIN UA: NEGATIVE
GLUCOSE UA: NEGATIVE
KETONES UA: NEGATIVE
NITRITE UA: POSITIVE
Protein, UA: NEGATIVE
Spec Grav, UA: 1.02
Urobilinogen, UA: 0.2
pH, UA: 6

## 2016-10-22 LAB — POCT GLYCOSYLATED HEMOGLOBIN (HGB A1C): Hemoglobin A1C: 6.1

## 2016-10-22 MED ORDER — CIPROFLOXACIN HCL 500 MG PO TABS: 500.0000 mg | ORAL_TABLET | Freq: Two times a day (BID) | ORAL | 0 refills | Status: DC

## 2016-10-22 NOTE — Progress Notes (Signed)
Subjective:    Patient ID: Cathy [name], female    DOB: 08/02/1946, 70 y.o.   MRN: [id number]  HPI  70 year old female who presents to the office today for follow up regarding diabetes. During her last visit we made the decision to come off the metformin and see how her blood pressures reacted. She reports that she is checking her blood sugars at home and the highest it has been has been 131. She is exercising multiple times per day but is eating poorly. Her last A1c was 6.2   She also reports that over the last she has been experiencing dysuria, frequency, and urgency. These symptoms started last week. She denies any abdominal pain or back pain.    Review of Systems  Constitutional: Negative.   HENT: Negative.   Respiratory: Negative.   Cardiovascular: Negative.   Gastrointestinal: Negative.   Genitourinary: Positive for dysuria, frequency and urgency. Negative for difficulty urinating, flank pain, genital sores, hematuria and pelvic pain.  Musculoskeletal: Negative.   Skin: Negative.   Neurological: Negative.   Hematological: Negative.   Psychiatric/Behavioral: Negative.   All other systems reviewed and are negative.  Past Medical History:  Diagnosis Date  . Chronic constipation   . Depression   . Diabetes mellitus type II   . DJD (degenerative joint disease)    knees  . Female cystocele   . GERD (gastroesophageal reflux disease)   . History of esophagitis   . History of MRSA infection    recurrent carbuncle  . History of recurrent UTIs   . Hyperlipidemia   . Hypertension   . Urgency of urination     Social History   Social History  . Marital status: Single    Spouse name: N/A  . Number of children: N/A  . Years of education: N/A   Occupational History  . Not on file.   Social History Main Topics  . Smoking status: Former Smoker    Years: 1.00    Types: Cigarettes    Quit date: 02/28/1996  . Smokeless tobacco: Never Used  . Alcohol use 0.0 oz/week     Comment: occasional  . Drug use: No  . Sexual activity: Not on file   Other Topics Concern  . Not on file   Social History Narrative   Single   Former Smoker  -  quit 10 to 11 years ago (light smoker)   Alcohol use-yes     2 children    Occupation: [place]     Past Surgical History:  Procedure Laterality Date  . APPENDECTOMY  1980's  . COLONOSCOPY  last one 06-18-2015  . INGUINAL HERNIA REPAIR Left 05-10-2001  . VAGINAL HYSTERECTOMY  1980's  . VAGINAL PROLAPSE REPAIR N/A 03/02/2016   Procedure: COLOPLAST ANTERIOR  VAULT REPAIR WITH AXIS DERMIS. SACROSPINUS FIXATION, AUGMENTATION WITH AXIS DERMIS;  Surgeon: Carolan Clines, MD;  Location: [facility];  Service: Urology;  Laterality: N/A;    Family History  Problem Relation Age of Onset  . Aneurysm Mother 41    deceased secondary to brain aneurysm  . Liver cancer Father 20    deceased  . Diabetes      grandmother  . Colon cancer Neg Hx   . Stomach cancer Neg Hx   . Rectal cancer Neg Hx   . Esophageal cancer Neg Hx     Allergies  Allergen Reactions  . Penicillins Hives    Current Outpatient Prescriptions on  File Prior to Visit  Medication Sig Dispense Refill  . acetaminophen (TYLENOL) 325 MG tablet Take 650 mg by mouth every 6 (six) hours as needed for pain (pain).    [name] aspirin 81 MG tablet Take 81 mg by mouth daily.      . Calcium Carbonate-Vitamin D (CALTRATE 600+D) 600-400 MG-UNIT per tablet Take 1 tablet by mouth daily.      . ciprofloxacin (CIPRO) 500 MG tablet Take 1 tablet (500 mg total) by mouth 2 (two) times daily. 10 tablet 0  . CRANBERRY PO Take by mouth.    . Garlic 123XX123 MG TABS Take 1 tablet by mouth daily.    [name] glucose blood (ONETOUCH VERIO) test strip Use as instructed 100 each 12  . lisinopril (PRINIVIL,ZESTRIL) 10 MG tablet take 1/2 tablet by mouth once daily 45 tablet 3  . loratadine (CLARITIN) 10 MG tablet Take 10 mg by mouth daily as needed for allergies.      . magnesium citrate SOLN Take 1 Bottle by mouth as needed for severe constipation.    . naproxen (NAPROSYN) 500 MG tablet Take 1 tablet (500 mg total) by mouth 2 (two) times daily. 30 tablet 0  . ONETOUCH DELICA LANCETS FINE MISC USE TO CHECK BLOOD SUGAR ONCE DAILY 100 each 3  . simvastatin (ZOCOR) 40 MG tablet take 1 tablet by mouth once daily AT 6 PM 90 tablet 1  . TURMERIC PO Take by mouth.    . metFORMIN (GLUCOPHAGE) 1000 MG tablet take 1 tablet by mouth twice a day with meals (Patient not taking: Reported on 10/22/2016) 180 tablet 3  . [DISCONTINUED] valsartan (DIOVAN) 160 MG tablet Take 1/2 tablet by mouth once daily 30 tablet 5   No current facility-administered medications on file prior to visit.     BP 118/68   Temp 98.2 F (36.8 C) (Oral)   Ht 5\' 6"  (1.676 m)   Wt 151 lb 3.2 oz (68.6 kg)   BMI 24.40 kg/m       Objective:   Physical Exam  Constitutional: She is oriented to person, place, and time. She appears well-developed and well-nourished. No distress.  Cardiovascular: Normal rate, regular rhythm, normal heart sounds and intact distal pulses.  Exam reveals no gallop and no friction rub.   No murmur heard. Pulmonary/Chest: Effort normal and breath sounds normal. No respiratory distress. She has no wheezes. She has no rales. She exhibits no tenderness.  Abdominal: Soft. Normal appearance, normal aorta and bowel sounds are normal. She exhibits no distension and no mass. There is no hepatosplenomegaly or hepatomegaly. There is no tenderness. There is no rebound, no guarding and no CVA tenderness.  Musculoskeletal: Normal range of motion. She exhibits no edema, tenderness or deformity.  Neurological: She is alert and oriented to person, place, and time.  Skin: Skin is warm and dry. No rash noted. She is not diaphoretic. No erythema. No pallor.  Psychiatric: She has a normal mood and affect. Her behavior is normal. Judgment and thought content normal.  Nursing note and vitals  reviewed.     Assessment & Plan:   1. Controlled type 2 diabetes mellitus with complication, without long-term current use of insulin (HCC) - POC HgB A1c- 6.1 - Do not need to restart on metformin  - Work on diet and exercise - Follow up in 6 months   2. Urinary frequency - POCT urinalysis dipstick +leuks and nitrites  - Culture, Urine - ciprofloxacin (CIPRO) 500 MG tablet; Take 1 tablet (  500 mg total) by mouth 2 (two) times daily.  Dispense: 6 tablet; Refill: 0  [name], NP

## 2016-10-25 LAB — URINE CULTURE

## 2016-10-29 ENCOUNTER — Other Ambulatory Visit: Payer: Self-pay

## 2016-10-29 ENCOUNTER — Telehealth: Payer: Self-pay | Admitting: Adult Health

## 2016-10-29 MED ORDER — CIPROFLOXACIN HCL 500 MG PO TABS: 500.0000 mg | ORAL_TABLET | Freq: Two times a day (BID) | ORAL | 0 refills | Status: DC

## 2016-10-29 NOTE — Telephone Encounter (Signed)
° °  Pt call to say she think she need a refill of the following med   ciprofloxacin (CIPRO) 500 MG tablet     Pharmacy [facility] [address]

## 2016-10-29 NOTE — Telephone Encounter (Signed)
Patient states she is still having urinary frequency and pressure. Ok to refill or does patient need an appt?

## 2016-10-29 NOTE — Telephone Encounter (Signed)
Rx has been refilled.  

## 2016-10-29 NOTE — Telephone Encounter (Signed)
Ok to do one refill. If she continues to have symptoms she needs to follow up.   Cipro 500mg  BID x 3 days

## 2016-11-19 ENCOUNTER — Other Ambulatory Visit: Payer: Self-pay | Admitting: Family Medicine

## 2016-11-19 NOTE — Telephone Encounter (Signed)
Pt need new Rx for simvastatin  Pharm: [facility] on [address]

## 2016-11-25 ENCOUNTER — Ambulatory Visit: Payer: Commercial Managed Care - HMO

## 2016-12-09 ENCOUNTER — Ambulatory Visit: Payer: Commercial Managed Care - HMO

## 2016-12-15 ENCOUNTER — Ambulatory Visit (INDEPENDENT_AMBULATORY_CARE_PROVIDER_SITE_OTHER): Payer: Medicare HMO | Admitting: Family Medicine

## 2016-12-15 ENCOUNTER — Encounter: Payer: Self-pay | Admitting: Family Medicine

## 2016-12-15 VITALS — BP 130/80 | HR 77 | Temp 98.2°F | Resp 12 | Ht 66.0 in | Wt 158.5 lb

## 2016-12-15 DIAGNOSIS — R350 Frequency of micturition: Secondary | ICD-10-CM | POA: Diagnosis not present

## 2016-12-15 DIAGNOSIS — R059 Cough, unspecified: Secondary | ICD-10-CM

## 2016-12-15 DIAGNOSIS — J069 Acute upper respiratory infection, unspecified: Secondary | ICD-10-CM

## 2016-12-15 DIAGNOSIS — R05 Cough: Secondary | ICD-10-CM | POA: Diagnosis not present

## 2016-12-15 DIAGNOSIS — J309 Allergic rhinitis, unspecified: Secondary | ICD-10-CM | POA: Diagnosis not present

## 2016-12-15 LAB — POCT URINALYSIS DIPSTICK
Bilirubin, UA: NEGATIVE
Glucose, UA: NEGATIVE
Ketones, UA: NEGATIVE
LEUKOCYTES UA: NEGATIVE
NITRITE UA: NEGATIVE
PH UA: 7.5
Protein, UA: NEGATIVE
RBC UA: NEGATIVE
Spec Grav, UA: 1.01
UROBILINOGEN UA: 0.2

## 2016-12-15 LAB — POCT INFLUENZA A/B
INFLUENZA A, POC: NEGATIVE
INFLUENZA B, POC: NEGATIVE

## 2016-12-15 MED ORDER — FLUTICASONE PROPIONATE 50 MCG/ACT NA SUSP: 1.0000 | Freq: Two times a day (BID) | NASAL | 3 refills | Status: DC

## 2016-12-15 MED ORDER — BENZONATATE 100 MG PO CAPS: 200.0000 mg | ORAL_CAPSULE | Freq: Two times a day (BID) | ORAL | 0 refills | Status: AC | PRN

## 2016-12-15 NOTE — Patient Instructions (Addendum)
  Ms.Shanoah A Ungerman I have seen you today for an acute visit.  1. Frequent urination Urine today negative.   - POCT urinalysis dipstick  2. URI, acute  viral infections are self-limited and we treat each symptom depending of severity.  Over the counter medications as decongestants and cold medications usually help, they need to be taken with caution if there is a history of high blood pressure or palpitations. Tylenol and/or Ibuprofen also helps with most symptoms (headache, muscle aching, fever,etc) Plenty of fluids. Honey helps with cough. Steam inhalations helps with runny nose, nasal congestion, and may prevent sinus infections. Cough and nasal congestion could last a few days and sometimes weeks. Please follow in not any better in 1-2 weeks or if symptoms get worse.  - benzonatate (TESSALON) 100 MG capsule; Take 2 capsules (200 mg total) by mouth 2 (two) times daily as needed for cough.  Dispense: 45 capsule; Refill: 0 - fluticasone (FLONASE) 50 MCG/ACT nasal spray; Place 1 spray into both nostrils 2 (two) times daily.  Dispense: 16 g; Refill: 3  3. Allergic rhinitis, unspecified chronicity, unspecified seasonality, unspecified trigger  Try Zyrtec 10 mg OR Allegra 180 mg daily.  Nasal saline drops daily. Flonase.   4. Cough  Can also be aggravated by GERD. So try Zantac 150 mg at bedtime.    - benzonatate (TESSALON) 100 MG capsule; Take 2 capsules (200 mg total) by mouth 2 (two) times daily as needed for cough.  Dispense: 45 capsule; Refill: 0    In general please monitor for signs of worsening symptoms and seek immediate medical attention if any concerning/warning symptom as we discussed.   If symptoms are not resolved in 1-2 weeks you should schedule a follow up appointment with your doctor, before if needed.

## 2016-12-15 NOTE — Progress Notes (Signed)
HPI:  ACUTE VISIT:  Chief Complaint  Patient presents with  . Cough    [name] is a 71 y.o. female, who is here today complaining of 2-3 days of respiratory symptoms.   Productive cough with brownish and clear sputum. She denies associated chest pain, dyspnea, or wheezing.  + Nasal congestion, rhinorrhea, sore throat, and post nasal drainage. Mild chills and body aches. She has not noted fever. Right rib cage pain upon coughing until yesterday, no rash.  No Hx of recent travel. Sick contact:None but concerned about influenza. No known insect bite. + Hx of allergies, she has ben on Loratadine but not longer helping.  Hx of GERD.  Medication OTC for this problem: Mucinex. Symptoms otherwise stable.  -She is also complaining about increasing in urinary frequency for about a week, nocturia x 3-4 times. She is reporting history of urinary frequency and s/p "ballder surgery" She denies dysuria, gross hematuria, or decreased urine output. History of DM 2, BS's in the low 100's.     Review of Systems  Constitutional: Positive for chills and fatigue. Negative for appetite change and fever.  HENT: Positive for congestion, postnasal drip, rhinorrhea, sinus pressure and sore throat. Negative for ear pain, mouth sores, sinus pain, trouble swallowing and voice change.   Eyes: Negative for pain, discharge, redness and visual disturbance.  Respiratory: Positive for cough. Negative for shortness of breath and wheezing.   Cardiovascular: Negative for chest pain, palpitations and leg swelling.  Gastrointestinal: Negative for abdominal pain, diarrhea, nausea and vomiting.  Genitourinary: Positive for frequency. Negative for difficulty urinating, dysuria and hematuria.  Musculoskeletal: Positive for myalgias. Negative for back pain and neck pain.  Skin: Negative for rash.  Allergic/Immunologic: Positive for environmental allergies.  Neurological: Positive for headaches  (Mild frontal pressure.). Negative for weakness and numbness.  Hematological: Negative for adenopathy. Does not bruise/bleed easily.  Psychiatric/Behavioral: Negative for confusion.      Current Outpatient Prescriptions on File Prior to Visit  Medication Sig Dispense Refill  . acetaminophen (TYLENOL) 325 MG tablet Take 650 mg by mouth every 6 (six) hours as needed for pain (pain).    [name] aspirin 81 MG tablet Take 81 mg by mouth daily.      . Calcium Carbonate-Vitamin D (CALTRATE 600+D) 600-400 MG-UNIT per tablet Take 1 tablet by mouth daily.      [name] CRANBERRY PO Take by mouth.    . Garlic 123XX123 MG TABS Take 1 tablet by mouth daily.    [name] glucose blood (ONETOUCH VERIO) test strip Use as instructed 100 each 12  . lisinopril (PRINIVIL,ZESTRIL) 10 MG tablet take 1/2 tablet by mouth once daily 45 tablet 3  . magnesium citrate SOLN Take 1 Bottle by mouth as needed for severe constipation.    . metFORMIN (GLUCOPHAGE) 1000 MG tablet take 1 tablet by mouth twice a day with meals 180 tablet 3  . naproxen (NAPROSYN) 500 MG tablet Take 1 tablet (500 mg total) by mouth 2 (two) times daily. 30 tablet 0  . ONETOUCH DELICA LANCETS FINE MISC USE TO CHECK BLOOD SUGAR ONCE DAILY 100 each 3  . simvastatin (ZOCOR) 40 MG tablet take 1 tablet by mouth once daily AT 6 PM 90 tablet 1  . TURMERIC PO Take by mouth.    . [DISCONTINUED] valsartan (DIOVAN) 160 MG tablet Take 1/2 tablet by mouth once daily 30 tablet 5   No current facility-administered medications on file prior to visit.  Past Medical History:  Diagnosis Date  . Chronic constipation   . Depression   . Diabetes mellitus type II   . DJD (degenerative joint disease)    knees  . Female cystocele   . GERD (gastroesophageal reflux disease)   . History of esophagitis   . History of MRSA infection    recurrent carbuncle  . History of recurrent UTIs   . Hyperlipidemia   . Hypertension   . Urgency of urination    Allergies  Allergen Reactions  .  Penicillins Hives    Social History   Social History  . Marital status: Single    Spouse name: N/A  . Number of children: N/A  . Years of education: N/A   Social History Main Topics  . Smoking status: Former Smoker    Years: 1.00    Types: Cigarettes    Quit date: 02/28/1996  . Smokeless tobacco: Never Used  . Alcohol use 0.0 oz/week     Comment: occasional  . Drug use: No  . Sexual activity: Not Asked   Other Topics Concern  . None   Social History Narrative   Single   Former Smoker  -  quit 10 to 11 years ago (light smoker)   Alcohol use-yes     2 children    Occupation: [employer]:   12/15/16 1016  BP: 130/80  Pulse: 77  Resp: 12  Temp: 98.2 F (36.8 C)   O2 sat 95% at RA.  Body mass index is 25.58 kg/m.    Physical Exam  Nursing note and vitals reviewed. Constitutional: She is oriented to person, place, and time. She appears well-developed and well-nourished. She does not appear ill. No distress.  HENT:  Head: Atraumatic.  Right Ear: Tympanic membrane, external ear and ear canal normal.  Left Ear: Tympanic membrane, external ear and ear canal normal.  Nose: Rhinorrhea present. Right sinus exhibits no maxillary sinus tenderness and no frontal sinus tenderness. Left sinus exhibits no maxillary sinus tenderness and no frontal sinus tenderness.  Mouth/Throat: Oropharynx is clear and moist and mucous membranes are normal.  R ear canal excess cerumen, TM seen partially. Hypertrophic turbinates. Postnasal drainage.  Eyes: Conjunctivae are normal.  Neck: No muscular tenderness present.  Cardiovascular: Normal rate and regular rhythm.   No murmur heard. Respiratory: Effort normal and breath sounds normal. No stridor. No respiratory distress. She exhibits no tenderness.  GI: There is no CVA tenderness.  Musculoskeletal:  No pain upon palpation of right costal area/rib cage  Lymphadenopathy:       Head (right side): No  submandibular adenopathy present.       Head (left side): No submandibular adenopathy present.    She has no cervical adenopathy.  Neurological: She is alert and oriented to person, place, and time. She has normal strength.  Skin: Skin is warm. No rash noted. No erythema.  Psychiatric: She has a normal mood and affect. Her speech is normal.  Well groomed, good eye contact.      ASSESSMENT AND PLAN:     Tiasia was seen today for cough.  Diagnoses and all orders for this visit:   URI, acute  Symptoms suggests a viral etiology, I explained patient that symptomatic treatment is usually recommended in this case, so I do not think abx is needed at this time. Instructed to monitor for signs of complications, clearly instructed about warning signs. I also explained that cough and nasal  congestion can last a few days and sometimes weeks. F/U as needed.   -     benzonatate (TESSALON) 100 MG capsule; Take 2 capsules (200 mg total) by mouth 2 (two) times daily as needed for cough. -     fluticasone (FLONASE) 50 MCG/ACT nasal spray; Place 1 spray into both nostrils 2 (two) times daily. -     POCT Influenza A/B  Frequent urination  Seems to be chronic. Urine dipstick negative.  -     POCT urinalysis dipstick  Allergic rhinitis, unspecified chronicity, unspecified seasonality, unspecified trigger  Could aggravate symptoms. Flonase nasal spray. OTC antihistaminic: Zyrtec or Allegra may help. Nasal irrigations with saline. F/U with PCP as needed.   -     fluticasone (FLONASE) 50 MCG/ACT nasal spray; Place 1 spray into both nostrils 2 (two) times daily.  Cough  Explained that cough after upper respiratory infection my last a few weeks. Some of her problems could exacerbate/caused cough as well, allergic rhinitis and GERD (recommend adding Zantac 150 mg at bedtime). I do not think imaging is needed at this time.  -     benzonatate (TESSALON) 100 MG capsule; Take 2 capsules  (200 mg total) by mouth 2 (two) times daily as needed for cough.     Return in about 2 weeks (around 12/29/2016), or if symptoms worsen or fail to improve, for PCP.   -[name] was advised to return or notify a doctor immediately if symptoms worsen or persist or new concerns arise.       Betty G. Martinique, MD  [facility]. [place] office.

## 2016-12-15 NOTE — Progress Notes (Signed)
Pre visit review using our clinic review tool, if applicable. No additional management support is needed unless otherwise documented below in the visit note. 

## 2016-12-17 ENCOUNTER — Telehealth: Payer: Self-pay | Admitting: Adult Health

## 2016-12-17 NOTE — Telephone Encounter (Signed)
I am sorry but I cannot prescribe her any antibiotics or cough syrups without seeing her first. If tessalon pearls are not working. Mucinex or delsym are the next best options.

## 2016-12-17 NOTE — Telephone Encounter (Signed)
See below

## 2016-12-17 NOTE — Telephone Encounter (Signed)
Pt saw dr Martinique on 12-15-16 and still has the cough and would like cory to order her some cough med. [facility] [address]

## 2016-12-17 NOTE — Telephone Encounter (Signed)
I am sorry but the only think I can have her take without being seen is Mucinex

## 2016-12-17 NOTE — Telephone Encounter (Signed)
Patient notified of Cory's comments. Patient states that her motor is broken in her car, and she had to pay a friend $20 to get here for her visit with Dr. Martinique on 1/23. She refuses to come back in and be seen.  She states that the Tessalon capsules that Dr. Martinique prescribed are not working, and that she needs something else.  Please advise.

## 2016-12-18 NOTE — Telephone Encounter (Signed)
Left message for patient to return phone call.  

## 2016-12-22 NOTE — Telephone Encounter (Signed)
Left message for patient notifying her of Cory's comments.

## 2017-01-12 DIAGNOSIS — R350 Frequency of micturition: Secondary | ICD-10-CM | POA: Diagnosis not present

## 2017-01-13 ENCOUNTER — Telehealth: Payer: Self-pay | Admitting: Adult Health

## 2017-01-13 ENCOUNTER — Ambulatory Visit
Admission: RE | Admit: 2017-01-13 | Discharge: 2017-01-13 | Disposition: A | Discharge status: Home / Self Care | Payer: Medicare HMO | Source: Ambulatory Visit | Attending: Adult Health | Admitting: Adult Health

## 2017-01-13 DIAGNOSIS — Z1231 Encounter for screening mammogram for malignant neoplasm of breast: Secondary | ICD-10-CM

## 2017-01-13 NOTE — Telephone Encounter (Signed)
Please advise 

## 2017-01-13 NOTE — Telephone Encounter (Signed)
Pt said dr Maudie Mercury took her off of centrum silver otc vitamin due to ?heart. Pt would like to restart due to fatigue. Pt said when she was taking vitamin no issue with fatigue

## 2017-01-14 NOTE — Telephone Encounter (Signed)
Patient notified of Cory's recommendations.

## 2017-01-14 NOTE — Telephone Encounter (Signed)
She can restart this medication if she would like

## 2017-03-25 ENCOUNTER — Other Ambulatory Visit: Payer: Self-pay | Admitting: Adult Health

## 2017-03-26 NOTE — Telephone Encounter (Signed)
Ok to refill for one year  

## 2017-04-14 ENCOUNTER — Telehealth: Payer: Self-pay | Admitting: Adult Health

## 2017-04-14 DIAGNOSIS — M25562 Pain in left knee: Secondary | ICD-10-CM | POA: Diagnosis not present

## 2017-04-14 NOTE — Telephone Encounter (Signed)
Pt would like to have lab work done and also would like to see if she could drop off urine she thinks she has a UTI state that her urine is cloudy. May I have orders for these?

## 2017-04-15 ENCOUNTER — Ambulatory Visit (INDEPENDENT_AMBULATORY_CARE_PROVIDER_SITE_OTHER): Payer: Medicare HMO | Admitting: Family Medicine

## 2017-04-15 ENCOUNTER — Encounter: Payer: Self-pay | Admitting: Family Medicine

## 2017-04-15 VITALS — BP 124/62 | HR 104 | Temp 98.0°F | Ht 66.0 in | Wt 155.5 lb

## 2017-04-15 DIAGNOSIS — R300 Dysuria: Secondary | ICD-10-CM

## 2017-04-15 LAB — POCT URINALYSIS DIPSTICK
BILIRUBIN UA: NEGATIVE
Glucose, UA: NEGATIVE
KETONES UA: NEGATIVE
NITRITE UA: NEGATIVE
PH UA: 7 (ref 5.0–8.0)
Protein, UA: NEGATIVE
RBC UA: NEGATIVE
SPEC GRAV UA: 1.025 (ref 1.010–1.025)
UROBILINOGEN UA: 0.2 U/dL

## 2017-04-15 MED ORDER — NITROFURANTOIN MONOHYD MACRO 100 MG PO CAPS: 100.0000 mg | ORAL_CAPSULE | Freq: Two times a day (BID) | ORAL | 0 refills | Status: DC

## 2017-04-15 NOTE — Addendum Note (Signed)
Addended by: Agnes Lawrence on: 04/15/2017 02:37 PM   Modules accepted: Orders

## 2017-04-15 NOTE — Progress Notes (Signed)
HPI:  Acute visit for Dysuria. Started 2 days ago. Hx of bladder tack. Symptoms include frequency, urgency, burning with urination. Denies fevers, malaise, flank pain, hematuria, vaginal symptoms, NVD.  ROS: See pertinent positives and negatives per HPI.  Past Medical History:  Diagnosis Date  . Chronic constipation   . Depression   . Diabetes mellitus type II   . DJD (degenerative joint disease)    knees  . Female cystocele   . GERD (gastroesophageal reflux disease)   . History of esophagitis   . History of MRSA infection    recurrent carbuncle  . History of recurrent UTIs   . Hyperlipidemia   . Hypertension   . Urgency of urination     Past Surgical History:  Procedure Laterality Date  . APPENDECTOMY  1980's  . BREAST EXCISIONAL BIOPSY Left 1989  . COLONOSCOPY  last one 06-18-2015  . INGUINAL HERNIA REPAIR Left 05-10-2001  . VAGINAL HYSTERECTOMY  1980's  . VAGINAL PROLAPSE REPAIR N/A 03/02/2016   Procedure: COLOPLAST ANTERIOR  VAULT REPAIR WITH AXIS DERMIS. SACROSPINUS FIXATION, AUGMENTATION WITH AXIS DERMIS;  Surgeon: Carolan Clines, MD;  Location: [facility];  Service: Urology;  Laterality: N/A;    Family History  Problem Relation Age of Onset  . Aneurysm Mother 24       deceased secondary to brain aneurysm  . Liver cancer Father 71       deceased  . Diabetes Unknown        grandmother  . Colon cancer Neg Hx   . Stomach cancer Neg Hx   . Rectal cancer Neg Hx   . Esophageal cancer Neg Hx     Social History   Social History  . Marital status: Single    Spouse name: N/A  . Number of children: N/A  . Years of education: N/A   Social History Main Topics  . Smoking status: Former Smoker    Years: 1.00    Types: Cigarettes    Quit date: 02/28/1996  . Smokeless tobacco: Never Used  . Alcohol use 0.0 oz/week     Comment: occasional  . Drug use: No  . Sexual activity: Not Asked   Other Topics Concern  . None   Social History  Narrative   Single   Former Smoker  -  quit 10 to 11 years ago (light smoker)   Alcohol use-yes     2 children    Occupation: Cathy [name]      Current Outpatient Prescriptions:  .  acetaminophen (TYLENOL) 325 MG tablet, Take 650 mg by mouth every 6 (six) hours as needed for pain (pain)., Disp: , Rfl:  .  aspirin 81 MG tablet, Take 81 mg by mouth daily.  , Disp: , Rfl:  .  Calcium Carbonate-Vitamin D (CALTRATE 600+D) 600-400 MG-UNIT per tablet, Take 1 tablet by mouth daily.  , Disp: , Rfl:  .  CRANBERRY PO, Take by mouth., Disp: , Rfl:  .  fluticasone (FLONASE) 50 MCG/ACT nasal spray, Place 1 spray into both nostrils 2 (two) times daily., Disp: 16 g, Rfl: 3 .  Garlic 465 MG TABS, Take 1 tablet by mouth daily., Disp: , Rfl:  .  glucose blood (ONETOUCH VERIO) test strip, Use as instructed, Disp: 100 each, Rfl: 12 .  lisinopril (PRINIVIL,ZESTRIL) 10 MG tablet, take 1/2 tablet by mouth once daily, Disp: 45 tablet, Rfl: 3 .  magnesium citrate SOLN, Take 1 Bottle by mouth as needed for severe constipation.,  Disp: , Rfl:  .  naproxen (NAPROSYN) 500 MG tablet, Take 1 tablet (500 mg total) by mouth 2 (two) times daily., Disp: 30 tablet, Rfl: 0 .  ONETOUCH DELICA LANCETS FINE MISC, USE TO CHECK BLOOD SUGAR ONCE DAILY, Disp: 100 each, Rfl: 3 .  simvastatin (ZOCOR) 40 MG tablet, take 1 tablet by mouth once daily AT 6 PM, Disp: 90 tablet, Rfl: 3 .  TURMERIC PO, Take by mouth., Disp: , Rfl:  .  nitrofurantoin, macrocrystal-monohydrate, (MACROBID) 100 MG capsule, Take 1 capsule (100 mg total) by mouth 2 (two) times daily., Disp: 14 capsule, Rfl: 0  EXAM:  Vitals:   04/15/17 1359  BP: 124/62  Pulse: (!) 104  Temp: 98 F (36.7 C)    Body mass index is 25.1 kg/m.  GENERAL: vitals reviewed and listed above, alert, oriented, appears well hydrated and in no acute distress  HEENT: atraumatic, conjunttiva clear, no obvious abnormalities on inspection of external nose and ears  NECK:  no obvious masses on inspection  LUNGS: clear to auscultation bilaterally, no wheezes, rales or rhonchi, good air movement  CV: HRRR, no peripheral edema  ABD: BS+, NTTP, no CVA TTP  MS: moves all extremities without noticeable abnormality  PSYCH: pleasant and cooperative, no obvious depression or anxiety  ASSESSMENT AND PLAN:  Discussed the following assessment and plan:  Dysuria - Plan: POC Urinalysis Dipstick  -udip with leuks - she opted for empiric treatment possible UTI while waiting on culture -return precautions -Patient advised to return or notify a doctor immediately if symptoms worsen or persist or new concerns arise.  Patient Instructions  Take the antibiotic (nitrofurantoin) as instructed.  I hope you are feeling better soon! Seek care immediately if worsening, new concerns or you are not improving with treatment.     Colin Benton R., DO

## 2017-04-15 NOTE — Telephone Encounter (Signed)
Patient will need an office visit. Earlene Plater, would you mind scheduling patient?

## 2017-04-15 NOTE — Patient Instructions (Signed)
Take the antibiotic (nitrofurantoin) as instructed.  I hope you are feeling better soon! Seek care immediately if worsening, new concerns or you are not improving with treatment.

## 2017-04-15 NOTE — Telephone Encounter (Signed)
Pt scheduled with Dr. Kim 

## 2017-04-18 LAB — URINE CULTURE

## 2017-04-19 ENCOUNTER — Other Ambulatory Visit: Payer: Self-pay | Admitting: Family Medicine

## 2017-04-19 MED ORDER — CIPROFLOXACIN HCL 250 MG PO TABS: 250.0000 mg | ORAL_TABLET | Freq: Two times a day (BID) | ORAL | 0 refills | Status: DC

## 2017-04-19 NOTE — Progress Notes (Signed)
rx sent

## 2017-06-07 ENCOUNTER — Other Ambulatory Visit: Payer: Self-pay | Admitting: Family Medicine

## 2017-06-07 DIAGNOSIS — E1121 Type 2 diabetes mellitus with diabetic nephropathy: Secondary | ICD-10-CM

## 2017-06-09 ENCOUNTER — Ambulatory Visit (INDEPENDENT_AMBULATORY_CARE_PROVIDER_SITE_OTHER): Payer: Medicare HMO | Admitting: Adult Health

## 2017-06-09 ENCOUNTER — Encounter: Payer: Self-pay | Admitting: Adult Health

## 2017-06-09 VITALS — BP 150/74 | Temp 97.8°F | Ht 66.0 in | Wt 155.0 lb

## 2017-06-09 DIAGNOSIS — E118 Type 2 diabetes mellitus with unspecified complications: Secondary | ICD-10-CM

## 2017-06-09 DIAGNOSIS — R350 Frequency of micturition: Secondary | ICD-10-CM

## 2017-06-09 LAB — POC URINALSYSI DIPSTICK (AUTOMATED)
Bilirubin, UA: NEGATIVE
Glucose, UA: NEGATIVE
Ketones, UA: NEGATIVE
NITRITE UA: NEGATIVE
PROTEIN UA: NEGATIVE
RBC UA: NEGATIVE
SPEC GRAV UA: 1.025 (ref 1.010–1.025)
UROBILINOGEN UA: 0.2 U/dL
pH, UA: 6 (ref 5.0–8.0)

## 2017-06-09 LAB — POCT GLYCOSYLATED HEMOGLOBIN (HGB A1C): HEMOGLOBIN A1C: 6.2

## 2017-06-09 LAB — GLUCOSE, POCT (MANUAL RESULT ENTRY): POC Glucose: 112 mg/dl — AB (ref 70–99)

## 2017-06-09 MED ORDER — SULFAMETHOXAZOLE-TRIMETHOPRIM 800-160 MG PO TABS: 1.0000 | ORAL_TABLET | Freq: Two times a day (BID) | ORAL | 0 refills | Status: DC

## 2017-06-09 NOTE — Progress Notes (Signed)
Subjective:    Patient ID: [name], female    DOB: 04-08-1946, 71 y.o.   MRN: [id number]  HPI  71 year old female who  has a past medical history of Chronic constipation; Depression; Diabetes mellitus type II; DJD (degenerative joint disease); Female cystocele; GERD (gastroesophageal reflux disease); History of esophagitis; History of MRSA infection; History of recurrent UTIs; Hyperlipidemia; Hypertension; and Urgency of urination.  She presents with an acute issue of urinary frequency. She has a history of bladder tack. Reported symptoms include that of frequency, urgency, and slight burning. Denies fevers, hematuria, low back pain, flank pain, any vaginal symptoms, n/v/d  She is also a diabetic, her last A1c was 6.1 in November 2017. She reports that earlier last week her blood sugars were reading in the 200's for two days, since   Review of Systems See HPI   Past Medical History:  Diagnosis Date  . Chronic constipation   . Depression   . Diabetes mellitus type II   . DJD (degenerative joint disease)    knees  . Female cystocele   . GERD (gastroesophageal reflux disease)   . History of esophagitis   . History of MRSA infection    recurrent carbuncle  . History of recurrent UTIs   . Hyperlipidemia   . Hypertension   . Urgency of urination     Social History   Social History  . Marital status: Single    Spouse name: N/A  . Number of children: N/A  . Years of education: N/A   Occupational History  . Not on file.   Social History Main Topics  . Smoking status: Former Smoker    Years: 1.00    Types: Cigarettes    Quit date: 02/28/1996  . Smokeless tobacco: Never Used  . Alcohol use 0.0 oz/week     Comment: occasional  . Drug use: No  . Sexual activity: Not on file   Other Topics Concern  . Not on file   Social History Narrative   Single   Former Smoker  -  quit 10 to 11 years ago (light smoker)   Alcohol use-yes     2 children    Occupation: [place]     Past Surgical History:  Procedure Laterality Date  . APPENDECTOMY  1980's  . BREAST EXCISIONAL BIOPSY Left 1989  . COLONOSCOPY  last one 06-18-2015  . INGUINAL HERNIA REPAIR Left 05-10-2001  . VAGINAL HYSTERECTOMY  1980's  . VAGINAL PROLAPSE REPAIR N/A 03/02/2016   Procedure: COLOPLAST ANTERIOR  VAULT REPAIR WITH AXIS DERMIS. SACROSPINUS FIXATION, AUGMENTATION WITH AXIS DERMIS;  Surgeon: Carolan Clines, MD;  Location: [facility];  Service: Urology;  Laterality: N/A;    Family History  Problem Relation Age of Onset  . Aneurysm Mother 76       deceased secondary to brain aneurysm  . Liver cancer Father 75       deceased  . Diabetes Unknown        grandmother  . Colon cancer Neg Hx   . Stomach cancer Neg Hx   . Rectal cancer Neg Hx   . Esophageal cancer Neg Hx     Allergies  Allergen Reactions  . Penicillins Hives    Current Outpatient Prescriptions on File Prior to Visit  Medication Sig Dispense Refill  . acetaminophen (TYLENOL) 325 MG tablet Take 650 mg by mouth every 6 (six) hours as needed for pain (pain).    [name]  aspirin 81 MG tablet Take 81 mg by mouth daily.      . Calcium Carbonate-Vitamin D (CALTRATE 600+D) 600-400 MG-UNIT per tablet Take 1 tablet by mouth daily.      [name] CRANBERRY PO Take by mouth.    . fluticasone (FLONASE) 50 MCG/ACT nasal spray Place 1 spray into both nostrils 2 (two) times daily. 16 g 3  . Garlic 397 MG TABS Take 1 tablet by mouth daily.    [name] lisinopril (PRINIVIL,ZESTRIL) 10 MG tablet take 1/2 tablet by mouth once daily 45 tablet 3  . magnesium citrate SOLN Take 1 Bottle by mouth as needed for severe constipation.    . naproxen (NAPROSYN) 500 MG tablet Take 1 tablet (500 mg total) by mouth 2 (two) times daily. 30 tablet 0  . ONETOUCH DELICA LANCETS FINE MISC USE TO CHECK BLOOD SUGAR ONCE DAILY 100 each 3  . ONETOUCH VERIO test strip TEST once daily 100 each 1  . simvastatin (ZOCOR) 40 MG tablet take 1  tablet by mouth once daily AT 6 PM 90 tablet 3  . TURMERIC PO Take by mouth.    . [DISCONTINUED] valsartan (DIOVAN) 160 MG tablet Take 1/2 tablet by mouth once daily 30 tablet 5   No current facility-administered medications on file prior to visit.     BP (!) 150/74 (BP Location: Left Arm)   Temp 97.8 F (36.6 C) (Oral)   Ht 5\' 6"  (1.676 m)   Wt 155 lb (70.3 kg)   BMI 25.02 kg/m       Objective:   Physical Exam  Constitutional: She is oriented to person, place, and time. She appears well-nourished.  Cardiovascular: Normal rate, regular rhythm, normal heart sounds and intact distal pulses.  Exam reveals no gallop and no friction rub.   No murmur heard. Pulmonary/Chest: Effort normal and breath sounds normal. No respiratory distress. She has no wheezes. She has no rales. She exhibits no tenderness.  Musculoskeletal: Normal range of motion. She exhibits no edema, tenderness or deformity.  Neurological: She is alert and oriented to person, place, and time.  Skin: Skin is warm and dry. No rash noted. No erythema. No pallor.  Psychiatric: She has a normal mood and affect. Her behavior is normal. Judgment and thought content normal.  Nursing note and vitals reviewed.     Assessment & Plan:  1. Urinary frequency - She would like to treat and will follow up with culture. Will treat with Bactrim BID x 3 days  - Urine Culture - POCT Urinalysis Dipstick (Automated) + leuks   2. Controlled type 2 diabetes mellitus with complication, without long-term current use of insulin (HCC)  - POC HgB A1c - 6.2  - POC Glucose (CBG) - [place], NP

## 2017-06-10 ENCOUNTER — Telehealth: Payer: Self-pay | Admitting: Adult Health

## 2017-06-10 LAB — URINE CULTURE

## 2017-06-10 MED ORDER — SULFAMETHOXAZOLE-TRIMETHOPRIM 800-160 MG PO TABS: 1.0000 | ORAL_TABLET | Freq: Two times a day (BID) | ORAL | 0 refills | Status: DC

## 2017-06-10 NOTE — Telephone Encounter (Signed)
rx resent  

## 2017-06-10 NOTE — Telephone Encounter (Signed)
° ° °  Pt contacted pharmacy and they told her they do not have the below med. Can we please resend    sulfamethoxazole-trimethoprim (BACTRIM DS,SEPTRA DS) 800-160 MG tablet   [place]

## 2017-07-01 ENCOUNTER — Ambulatory Visit (INDEPENDENT_AMBULATORY_CARE_PROVIDER_SITE_OTHER): Payer: Medicare HMO | Admitting: Adult Health

## 2017-07-01 ENCOUNTER — Encounter: Payer: Self-pay | Admitting: Adult Health

## 2017-07-01 VITALS — BP 148/62 | Temp 98.1°F | Ht 63.0 in | Wt 158.0 lb

## 2017-07-01 DIAGNOSIS — Z0000 Encounter for general adult medical examination without abnormal findings: Secondary | ICD-10-CM

## 2017-07-01 DIAGNOSIS — E118 Type 2 diabetes mellitus with unspecified complications: Secondary | ICD-10-CM | POA: Diagnosis not present

## 2017-07-01 DIAGNOSIS — Z0001 Encounter for general adult medical examination with abnormal findings: Secondary | ICD-10-CM

## 2017-07-01 DIAGNOSIS — Z23 Encounter for immunization: Secondary | ICD-10-CM | POA: Diagnosis not present

## 2017-07-01 DIAGNOSIS — F339 Major depressive disorder, recurrent, unspecified: Secondary | ICD-10-CM | POA: Diagnosis not present

## 2017-07-01 DIAGNOSIS — I10 Essential (primary) hypertension: Secondary | ICD-10-CM

## 2017-07-01 DIAGNOSIS — E785 Hyperlipidemia, unspecified: Secondary | ICD-10-CM

## 2017-07-01 LAB — BASIC METABOLIC PANEL
BUN: 19 mg/dL (ref 6–23)
CO2: 29 meq/L (ref 19–32)
Calcium: 9.7 mg/dL (ref 8.4–10.5)
Chloride: 104 mEq/L (ref 96–112)
Creatinine, Ser: 0.92 mg/dL (ref 0.40–1.20)
GFR: 77.91 mL/min (ref >60.00)
GLUCOSE: 111 mg/dL — AB (ref 70–99)
POTASSIUM: 4.5 meq/L (ref 3.5–5.1)
Sodium: 140 mEq/L (ref 135–145)

## 2017-07-01 LAB — HEPATIC FUNCTION PANEL
ALK PHOS: 66 U/L (ref 39–117)
ALT: 18 U/L (ref 0–35)
AST: 19 U/L (ref 0–37)
Albumin: 4.5 g/dL (ref 3.5–5.2)
Bilirubin, Direct: 0.1 mg/dL (ref 0.0–0.3)
Total Bilirubin: 0.5 mg/dL (ref 0.2–1.2)
Total Protein: 6.8 g/dL (ref 6.0–8.3)

## 2017-07-01 LAB — LIPID PANEL
CHOL/HDL RATIO: 3
Cholesterol: 184 mg/dL (ref 0–200)
HDL: 63.2 mg/dL (ref >39.00)
LDL CALC: 103 mg/dL — AB (ref 0–99)
NonHDL: 120.83
Triglycerides: 88 mg/dL (ref 0.0–149.0)
VLDL: 17.6 mg/dL (ref 0.0–40.0)

## 2017-07-01 LAB — CBC WITH DIFFERENTIAL/PLATELET
Basophils Absolute: 0 10*3/uL (ref 0.0–0.1)
Basophils Relative: 0.6 % (ref 0.0–3.0)
EOS PCT: 3.6 % (ref 0.0–5.0)
Eosinophils Absolute: 0.2 10*3/uL (ref 0.0–0.7)
HCT: 42.9 % (ref 36.0–46.0)
Hemoglobin: 13.5 g/dL (ref 12.0–15.0)
LYMPHS ABS: 2 10*3/uL (ref 0.7–4.0)
Lymphocytes Relative: 31.4 % (ref 12.0–46.0)
MCHC: 31.4 g/dL (ref 30.0–36.0)
MCV: 79.8 fl (ref 78.0–100.0)
MONO ABS: 0.4 10*3/uL (ref 0.1–1.0)
MONOS PCT: 6 % (ref 3.0–12.0)
NEUTROS ABS: 3.7 10*3/uL (ref 1.4–7.7)
NEUTROS PCT: 58.4 % (ref 43.0–77.0)
PLATELETS: 292 10*3/uL (ref 150.0–400.0)
RBC: 5.37 Mil/uL — AB (ref 3.87–5.11)
RDW: 15.6 % — AB (ref 11.5–15.5)
WBC: 6.4 10*3/uL (ref 4.0–10.5)

## 2017-07-01 LAB — TSH: TSH: 0.93 u[IU]/mL (ref 0.35–4.50)

## 2017-07-01 LAB — HEMOGLOBIN A1C: HEMOGLOBIN A1C: 6.7 % — AB (ref 4.6–6.5)

## 2017-07-01 NOTE — Progress Notes (Addendum)
Subjective:    Patient ID: Cathy [name], female    DOB: 1946-09-18, 71 y.o.   MRN: [id number]  HPI  Patient presents for yearly preventative medicine examination. She is a pleasant 71 year old female who  has a past medical history of Chronic constipation; Depression; Diabetes mellitus type II; DJD (degenerative joint disease); Female cystocele; GERD (gastroesophageal reflux disease); History of esophagitis; History of MRSA infection; History of recurrent UTIs; Hyperlipidemia; Hypertension; and Urgency of urination.  All immunizations and health maintenance protocols were reviewed with the patient and needed orders were placed. She is due for Pneumovax 23   Appropriate screening laboratory values were ordered for the patient including screening of hyperlipidemia, renal function and hepatic function.  Medication reconciliation,  past medical history, social history, problem list and allergies were reviewed in detail with the patient  Goals were established with regard to weight loss, exercise, and  diet in compliance with medications. She reports trying to follow a healthy diet and is staying active by going to the [facility] multiple times per week   End of life planning was discussed.  Diabetes - Diet controlled. Her last A1c was 6.2 in 05/2017. Monitors her blood sugar periodically.   Depression  - Feels as though this is well controlled.   Hypertension  - She has been cutting her lisinopril in half to 5 mg. Today in the office her blood pressure is 148/62  Hyperlipidemia - She takes simvastatin 40 mg   She is up to date on her colonoscopy and mammogram. She does not participate in regular dental or vision exams.   She is complaining of left knee pain. This has been an issue for an unknown amount of time. Pain is worse with movement and twisting motions.   Review of Systems  Constitutional: Negative.   HENT: Negative.   Eyes: Negative.   Respiratory: Negative.     Cardiovascular: Negative.   Gastrointestinal: Negative.   Endocrine: Negative.   Genitourinary: Negative.   Musculoskeletal: Positive for arthralgias and joint swelling.  Skin: Negative.   Allergic/Immunologic: Negative.   Neurological: Negative.   Hematological: Negative.   Psychiatric/Behavioral: Negative.   All other systems reviewed and are negative.  Past Medical History:  Diagnosis Date  . Chronic constipation   . Depression   . Diabetes mellitus type II   . DJD (degenerative joint disease)    knees  . Female cystocele   . GERD (gastroesophageal reflux disease)   . History of esophagitis   . History of MRSA infection    recurrent carbuncle  . History of recurrent UTIs   . Hyperlipidemia   . Hypertension   . Urgency of urination     Social History   Social History  . Marital status: Single    Spouse name: N/A  . Number of children: N/A  . Years of education: N/A   Occupational History  . Not on file.   Social History Main Topics  . Smoking status: Former Smoker    Years: 1.00    Types: Cigarettes    Quit date: 02/28/1996  . Smokeless tobacco: Never Used  . Alcohol use 0.0 oz/week     Comment: occasional  . Drug use: No  . Sexual activity: Not on file   Other Topics Concern  . Not on file   Social History Narrative   Single   Former Smoker  -  quit 10 to 11 years ago (light smoker)   Alcohol use-yes  2 children    Occupation: [place]     Past Surgical History:  Procedure Laterality Date  . APPENDECTOMY  1980's  . BREAST EXCISIONAL BIOPSY Left 1989  . COLONOSCOPY  last one 06-18-2015  . INGUINAL HERNIA REPAIR Left 05-10-2001  . VAGINAL HYSTERECTOMY  1980's  . VAGINAL PROLAPSE REPAIR N/A 03/02/2016   Procedure: COLOPLAST ANTERIOR  VAULT REPAIR WITH AXIS DERMIS. SACROSPINUS FIXATION, AUGMENTATION WITH AXIS DERMIS;  Surgeon: Carolan Clines, MD;  Location: [facility];  Service: Urology;  Laterality: N/A;     Family History  Problem Relation Age of Onset  . Aneurysm Mother 40       deceased secondary to brain aneurysm  . Liver cancer Father 31       deceased  . Diabetes Unknown        grandmother  . Colon cancer Neg Hx   . Stomach cancer Neg Hx   . Rectal cancer Neg Hx   . Esophageal cancer Neg Hx     Allergies  Allergen Reactions  . Penicillins Hives    Current Outpatient Prescriptions on File Prior to Visit  Medication Sig Dispense Refill  . acetaminophen (TYLENOL) 325 MG tablet Take 650 mg by mouth every 6 (six) hours as needed for pain (pain).    [name] aspirin 81 MG tablet Take 81 mg by mouth daily.      . Calcium Carbonate-Vitamin D (CALTRATE 600+D) 600-400 MG-UNIT per tablet Take 1 tablet by mouth daily.      [name] CRANBERRY PO Take by mouth.    [name] lisinopril (PRINIVIL,ZESTRIL) 10 MG tablet take 1/2 tablet by mouth once daily 45 tablet 3  . magnesium citrate SOLN Take 1 Bottle by mouth as needed for severe constipation.    Glory Rosebush DELICA LANCETS FINE MISC USE TO CHECK BLOOD SUGAR ONCE DAILY 100 each 3  . ONETOUCH VERIO test strip TEST once daily 100 each 1  . simvastatin (ZOCOR) 40 MG tablet take 1 tablet by mouth once daily AT 6 PM 90 tablet 3  . TURMERIC PO Take by mouth.    . [DISCONTINUED] valsartan (DIOVAN) 160 MG tablet Take 1/2 tablet by mouth once daily 30 tablet 5   No current facility-administered medications on file prior to visit.     BP (!) 148/62 (BP Location: Left Arm)   Temp 98.1 F (36.7 C) (Oral)   Ht 5\' 3"  (1.6 m)   Wt 158 lb (71.7 kg)   BMI 27.99 kg/m       Objective:   Physical Exam  Constitutional: She is oriented to person, place, and time. She appears well-developed and well-nourished. No distress.  HENT:  Head: Normocephalic and atraumatic.  Right Ear: External ear normal.  Left Ear: External ear normal.  Nose: Nose normal.  Mouth/Throat: Oropharynx is clear and moist. Abnormal dentition. No oropharyngeal exudate.  Eyes: Pupils are  equal, round, and reactive to light. Conjunctivae and EOM are normal. Right eye exhibits no discharge. Left eye exhibits no discharge. No scleral icterus.  Neck: Normal range of motion. Neck supple. No JVD present. Carotid bruit is not present. No tracheal deviation present. No thyroid mass and no thyromegaly present.  Cardiovascular: Normal rate, regular rhythm, normal heart sounds and intact distal pulses.  Exam reveals no gallop and no friction rub.   No murmur heard. Pulmonary/Chest: Effort normal and breath sounds normal. No stridor. No respiratory distress. She has no wheezes. She has no rales. She exhibits no  tenderness.  Abdominal: Soft. Bowel sounds are normal. She exhibits no distension and no mass. There is no tenderness. There is no rebound and no guarding.  Genitourinary:  Genitourinary Comments: Breast Exam: No masses, lumps, dimpling or discharge   Musculoskeletal: Normal range of motion. She exhibits tenderness (back of left knee. Bakers Cyst present ). She exhibits no edema or deformity.  Lymphadenopathy:    She has no cervical adenopathy.  Neurological: She is alert and oriented to person, place, and time. She has normal reflexes. She displays normal reflexes. No cranial nerve deficit. She exhibits normal muscle tone. Coordination normal.  Skin: Skin is warm and dry. No rash noted. No erythema. No pallor.  Psychiatric: She has a normal mood and affect. Her behavior is normal. Judgment and thought content normal.  Nursing note and vitals reviewed.      Assessment & Plan:  1. Routine general medical examination at a health care facility - Needs eye exam  - Take full dose of lisinopril  - Pneumovax 23 given  - She will follow up next week for cortisone injection  - Basic metabolic panel - CBC with Differential/Platelet - Hemoglobin A1c - Hepatic function panel - Lipid panel - TSH  2. Depression, recurrent ([place]) - Well controlled. No need for mediation at this time  3.  Dyslipidemia - Consider increasing Simvastatin  - Basic metabolic panel - CBC with Differential/Platelet - Hemoglobin A1c - Hepatic function panel - Lipid panel - TSH  4. Controlled type 2 diabetes mellitus with complication, without long-term current use of insulin ([place]) - Consider adding metformin  - Basic metabolic panel - CBC with Differential/Platelet - Hemoglobin A1c - Hepatic function panel - Lipid panel - TSH  5. Essential hypertension - Take 10 mg of lisinopril daily  - Basic metabolic panel - CBC with Differential/Platelet - Hemoglobin A1c - Hepatic function panel - Lipid panel - TSH  [name], NP

## 2017-07-01 NOTE — Addendum Note (Signed)
Addended by: Miles Costain T on: 07/01/2017 08:05 AM   Modules accepted: Orders

## 2017-07-01 NOTE — Patient Instructions (Signed)
It was great seeing you today   I will follow up with you regarding your blood work   Please follow up with me next week for a cortisone injection to the left knee

## 2017-07-02 ENCOUNTER — Encounter: Payer: Medicare HMO | Admitting: Adult Health

## 2017-07-08 ENCOUNTER — Ambulatory Visit (INDEPENDENT_AMBULATORY_CARE_PROVIDER_SITE_OTHER): Payer: Medicare HMO | Admitting: Adult Health

## 2017-07-08 ENCOUNTER — Ambulatory Visit (INDEPENDENT_AMBULATORY_CARE_PROVIDER_SITE_OTHER)
Admission: RE | Admit: 2017-07-08 | Discharge: 2017-07-08 | Disposition: A | Discharge status: Home / Self Care | Payer: Medicare HMO | Source: Ambulatory Visit | Attending: Adult Health | Admitting: Adult Health

## 2017-07-08 ENCOUNTER — Encounter: Payer: Self-pay | Admitting: Adult Health

## 2017-07-08 VITALS — BP 130/72 | Temp 98.3°F | Wt 156.0 lb

## 2017-07-08 DIAGNOSIS — M25572 Pain in left ankle and joints of left foot: Secondary | ICD-10-CM | POA: Diagnosis not present

## 2017-07-08 DIAGNOSIS — M67879 Other specified disorders of synovium and tendon, unspecified ankle and foot: Secondary | ICD-10-CM

## 2017-07-08 DIAGNOSIS — M7122 Synovial cyst of popliteal space [Baker], left knee: Secondary | ICD-10-CM | POA: Diagnosis not present

## 2017-07-08 DIAGNOSIS — M67979 Unspecified disorder of synovium and tendon, unspecified ankle and foot: Secondary | ICD-10-CM

## 2017-07-08 DIAGNOSIS — M67972 Unspecified disorder of synovium and tendon, left ankle and foot: Secondary | ICD-10-CM | POA: Diagnosis not present

## 2017-07-08 MED ORDER — METHYLPREDNISOLONE ACETATE 80 MG/ML IJ SUSP
80.0000 mg | Freq: Once | INTRAMUSCULAR | Status: AC
  Administered 2017-07-08: 80 mg via INTRA_ARTICULAR

## 2017-07-08 NOTE — Progress Notes (Signed)
Subjective:    Patient ID: Cathy [name], female    DOB: 26-Oct-1946, 71 y.o.   MRN: [id number]  HPI  71 year old female who  has a past medical history of Chronic constipation; Depression; Diabetes mellitus type II; DJD (degenerative joint disease); Female cystocele; GERD (gastroesophageal reflux disease); History of esophagitis; History of MRSA infection; History of recurrent UTIs; Hyperlipidemia; Hypertension; and Urgency of urination.  She presents to the office today for the complaint of left knee pain and" a knot" behind the left ankle. She reports that the left knee has been painful for an unknown amount of time. Pain is worse with movement and twisting. She denies any injury or trauma. Bakers Cyst was felt during CPE   She also reports have " a knot" on the back of her left ankle that has been present for 2 months. She does not feel as though it has grown in size Reports mild tenderness with touch. No redness or warmth.    Review of Systems See HPI   Past Medical History:  Diagnosis Date  . Chronic constipation   . Depression   . Diabetes mellitus type II   . DJD (degenerative joint disease)    knees  . Female cystocele   . GERD (gastroesophageal reflux disease)   . History of esophagitis   . History of MRSA infection    recurrent carbuncle  . History of recurrent UTIs   . Hyperlipidemia   . Hypertension   . Urgency of urination     Social History   Social History  . Marital status: Single    Spouse name: N/A  . Number of children: N/A  . Years of education: N/A   Occupational History  . Not on file.   Social History Main Topics  . Smoking status: Former Smoker    Years: 1.00    Types: Cigarettes    Quit date: 02/28/1996  . Smokeless tobacco: Never Used  . Alcohol use 0.0 oz/week     Comment: occasional  . Drug use: No  . Sexual activity: Not on file   Other Topics Concern  . Not on file   Social History Narrative   Single   Former Smoker  -   quit 10 to 11 years ago (light smoker)   Alcohol use-yes     2 children    Occupation: [place]     Past Surgical History:  Procedure Laterality Date  . APPENDECTOMY  1980's  . BREAST EXCISIONAL BIOPSY Left 1989  . COLONOSCOPY  last one 06-18-2015  . INGUINAL HERNIA REPAIR Left 05-10-2001  . VAGINAL HYSTERECTOMY  1980's  . VAGINAL PROLAPSE REPAIR N/A 03/02/2016   Procedure: COLOPLAST ANTERIOR  VAULT REPAIR WITH AXIS DERMIS. SACROSPINUS FIXATION, AUGMENTATION WITH AXIS DERMIS;  Surgeon: Carolan Clines, MD;  Location: [facility];  Service: Urology;  Laterality: N/A;    Family History  Problem Relation Age of Onset  . Aneurysm Mother 66       deceased secondary to brain aneurysm  . Liver cancer Father 64       deceased  . Diabetes Unknown        grandmother  . Colon cancer Neg Hx   . Stomach cancer Neg Hx   . Rectal cancer Neg Hx   . Esophageal cancer Neg Hx     Allergies  Allergen Reactions  . Penicillins Hives    Current Outpatient Prescriptions on File Prior to Visit  Medication Sig Dispense Refill  . acetaminophen (TYLENOL) 325 MG tablet Take 650 mg by mouth every 6 (six) hours as needed for pain (pain).    [name] aspirin 81 MG tablet Take 81 mg by mouth daily.      . Calcium Carbonate-Vitamin D (CALTRATE 600+D) 600-400 MG-UNIT per tablet Take 1 tablet by mouth daily.      [name] CRANBERRY PO Take by mouth.    [name] lisinopril (PRINIVIL,ZESTRIL) 10 MG tablet take 1/2 tablet by mouth once daily 45 tablet 3  . loratadine (CLARITIN) 10 MG tablet Take 10 mg by mouth daily.    . magnesium citrate SOLN Take 1 Bottle by mouth as needed for severe constipation.    . meloxicam (MOBIC) 15 MG tablet 1 tablet oral daily PRN  0  . omeprazole (PRILOSEC) 20 MG capsule 1 capsule orally daily  0  . ONETOUCH DELICA LANCETS FINE MISC USE TO CHECK BLOOD SUGAR ONCE DAILY 100 each 3  . ONETOUCH VERIO test strip TEST once daily 100 each 1  . simvastatin (ZOCOR) 40 MG  tablet take 1 tablet by mouth once daily AT 6 PM 90 tablet 3  . TURMERIC PO Take by mouth.    . [DISCONTINUED] valsartan (DIOVAN) 160 MG tablet Take 1/2 tablet by mouth once daily 30 tablet 5   No current facility-administered medications on file prior to visit.     BP 130/72 (BP Location: Left Arm)   Temp 98.3 F (36.8 C) (Oral)   Wt 156 lb (70.8 kg)   BMI 27.63 kg/m       Objective:   Physical Exam  Constitutional: She is oriented to person, place, and time. She appears well-developed and well-nourished. No distress.  Cardiovascular: Normal rate, regular rhythm, normal heart sounds and intact distal pulses.  Exam reveals no gallop and no friction rub.   No murmur heard. Pulmonary/Chest: Effort normal and breath sounds normal. No respiratory distress. She has no wheezes. She has no rales. She exhibits no tenderness.  Musculoskeletal: She exhibits tenderness. She exhibits no edema or deformity.  Tenderness with palpation throughout left knee. Bakers Cyst is still present.   She does have a possible small calcification on left achilles tendon   Neurological: She is alert and oriented to person, place, and time.  Skin: Skin is warm and dry. No rash noted. She is not diaphoretic. No erythema. No pallor.  Psychiatric: She has a normal mood and affect. Her behavior is normal. Judgment and thought content normal.  Nursing note and vitals reviewed.      Assessment & Plan:  1. Achilles tendon mass - DG Ankle Complete Left; Future  - Consider referral to orthopedics   2. Baker's cyst of knee, left Discussed risks and benefits of corticosteroid injection and patient consented.  After prepping skin with betadine, injected 80 mg depomedrol and 2 cc of plain xylocaine with 22 gauge one and one half inch needle using anterolateral approach and pt tolerated well. - methylPREDNISolone acetate (DEPO-MEDROL) injection 80 mg; Inject 1 mL (80 mg total) into the articular space once. - Follow up  if no improvement  - Consider referral to orthopedics   [name], NP

## 2017-07-09 ENCOUNTER — Other Ambulatory Visit: Payer: Self-pay | Admitting: Adult Health

## 2017-07-09 ENCOUNTER — Telehealth: Payer: Self-pay | Admitting: Adult Health

## 2017-07-09 DIAGNOSIS — N390 Urinary tract infection, site not specified: Secondary | ICD-10-CM

## 2017-07-09 MED ORDER — DICLOFENAC SODIUM 1 % TD GEL
2.0000 g | Freq: Four times a day (QID) | TRANSDERMAL | 3 refills | Status: DC
Start: 2017-07-09 — End: 2017-09-24

## 2017-07-09 NOTE — Telephone Encounter (Signed)
She has been referred. IF her symptoms get bad over the weekend she should go to urgent care. I cannot do anything for her now without an exam

## 2017-07-09 NOTE — Telephone Encounter (Signed)
Pt requested to see Dr. Gaynelle Arabian due to past hx of bladder surgery.  Previous endo doc is has retired.  She is suppose to go for follow up visits.  Also having urinary frequency.  Please advise.

## 2017-07-09 NOTE — Telephone Encounter (Signed)
Pt notified referral placed and to go visit [facility] if sx worsen over the weekend.

## 2017-07-09 NOTE — Telephone Encounter (Signed)
Misty pt returned your call for lab results and pt would like to have a referral to Dr. Dorris Fetch office

## 2017-07-14 ENCOUNTER — Telehealth: Payer: Self-pay | Admitting: Adult Health

## 2017-07-14 NOTE — Telephone Encounter (Signed)
Pharmacy changed.  Left a message for a return call.

## 2017-07-14 NOTE — Telephone Encounter (Signed)
° ° ° °  Pt said she does not use [facility] and would like it deleted from her file.   The below med went to [facility] and it should have went to   diclofenac sodium (VOLTAREN) 1 % GEL  [facility] [address]   She also asked Misty if you would call her this afternoon. Would not discuss with me

## 2017-07-15 NOTE — Telephone Encounter (Signed)
Spoke to the pt.  She stated that she has been really tired lately and wanted to know if an iron pill would make her feel better.  Reviewed chart and advised last iron levels were normal.  Advised she should come in and speak to [name].  She will check her work schedule and call back.  No further action needed.  Will close note.

## 2017-08-02 NOTE — Progress Notes (Signed)
[name] [place], [place] [address] Phone: [phone] Subjective:    I'm seeing this patient by the request  of:    CC: knee pain   ALP:[id number]  Cathy [name] is a 71 y.o. female coming in with complaint of knee pain.left side Concern for more of a Baker cyst. Patient did see primary care provider and was given an injection. Patient states that the injection did not work. She said that she fell on the knee years ago. She is a Scientist, water quality at [employer] and states that she has intermittent pain with standing. Patient does have a history of knee injuries as she landed on a nail when she was a kid. Noticeable scarring over lateral portion of knee.  Onset- months  Location- left knee Duration- intermittent Character- dull and sharp Aggravating factors- unable to pinpoin Reliving factors-  Therapies tried- lidocaine gel, heat, ice    patient did have x-rays of left knee 06/07/2016. These were independently visualized by me showing mild degenerative changes.  Past Medical History:  Diagnosis Date  . Chronic constipation   . Depression   . Diabetes mellitus type II   . DJD (degenerative joint disease)    knees  . Female cystocele   . GERD (gastroesophageal reflux disease)   . History of esophagitis   . History of MRSA infection    recurrent carbuncle  . History of recurrent UTIs   . Hyperlipidemia   . Hypertension   . Urgency of urination    Past Surgical History:  Procedure Laterality Date  . APPENDECTOMY  1980's  . BREAST EXCISIONAL BIOPSY Left 1989  . COLONOSCOPY  last one 06-18-2015  . INGUINAL HERNIA REPAIR Left 05-10-2001  . VAGINAL HYSTERECTOMY  1980's  . VAGINAL PROLAPSE REPAIR N/A 03/02/2016   Procedure: COLOPLAST ANTERIOR  VAULT REPAIR WITH AXIS DERMIS. SACROSPINUS FIXATION, AUGMENTATION WITH AXIS DERMIS;  Surgeon: Carolan Clines, MD;  Location: [facility];  Service: Urology;  Laterality: N/A;    Social History   Social History  . Marital status: Single    Spouse name: N/A  . Number of children: N/A  . Years of education: N/A   Social History Main Topics  . Smoking status: Former Smoker    Years: 1.00    Types: Cigarettes    Quit date: 02/28/1996  . Smokeless tobacco: Never Used  . Alcohol use 0.0 oz/week     Comment: occasional  . Drug use: No  . Sexual activity: Not Asked   Other Topics Concern  . None   Social History Narrative   Single   Former Smoker  -  quit 10 to 11 years ago (light smoker)   Alcohol use-yes     2 children    Occupation: [employer]    Allergies  Allergen Reactions  . Penicillins Hives   Family History  Problem Relation Age of Onset  . Aneurysm Mother 38       deceased secondary to brain aneurysm  . Liver cancer Father 74       deceased  . Diabetes Unknown        grandmother  . Colon cancer Neg Hx   . Stomach cancer Neg Hx   . Rectal cancer Neg Hx   . Esophageal cancer Neg Hx      Past medical history, social, surgical and family history all reviewed in electronic medical record.  No pertanent information unless stated regarding  to the chief complaint.   Review of Systems:Review of systems updated and as accurate as of 08/03/17  No headache, visual changes, nausea, vomiting, diarrhea, constipation, dizziness, abdominal pain, skin rash, fevers, chills, night sweats, weight loss, swollen lymph nodes, body aches, joint swelling, chest pain, shortness of breath, mood changes.  Positive muscle aches  Objective  Blood pressure 120/84, pulse 78, height 5\' 4"  (1.626 m), weight 149 lb (67.6 kg), SpO2 98 %. Systems examined below as of 08/03/17   General: No apparent distress alert and oriented x3 mood and affect normal, dressed appropriately.  HEENT: Pupils equal, extraocular movements intact  Respiratory: Patient's speak in full sentences and does not appear short of breath  Cardiovascular: No lower extremity edema,  non tender, no erythema  Skin: Warm dry intact with no signs of infection or rash on extremities or on axial skeleton.  Abdomen: Soft nontender  Neuro: Cranial nerves II through XII are intact, neurovascularly intact in all extremities with 2+ DTRs and 2+ pulses.  Lymph: No lymphadenopathy of posterior or anterior cervical chain or axillae bilaterally.  Gait antalgic gait MSK:  Non tender with full range of motion and good stability and symmetric strength and tone of shoulders, elbows, wrist, hip, and ankles bilaterally.  Knee: Left valgus deformity noted. Abnormal thigh to calf ratio.  Tender to palpation over medial and PF joint line.  ROM full in flexion and extension and lower leg rotation. instability with valgus force.  painful patellar compression. Patellar glide with moderate crepitus. Patellar and quadriceps tendons unremarkable. Hamstring and quadriceps strength is normal. Contralateral knee shows mild arthritis.   After informed written and verbal consent, patient was seated on exam table. Left knee was prepped with alcohol swab and utilizing anterolateral approach, patient's left knee space was injected with 4:1  marcaine 0.5%: Kenalog 40mg /dL. Patient tolerated the procedure well without immediate complications.   Impression and Recommendations:     This case required medical decision making of moderate complexity.      Note: This dictation was prepared with Dragon dictation along with smaller phrase technology. Any transcriptional errors that result from this process are unintentional.

## 2017-08-03 ENCOUNTER — Encounter: Payer: Self-pay | Admitting: Family Medicine

## 2017-08-03 ENCOUNTER — Other Ambulatory Visit: Payer: Self-pay | Admitting: Adult Health

## 2017-08-03 ENCOUNTER — Ambulatory Visit (INDEPENDENT_AMBULATORY_CARE_PROVIDER_SITE_OTHER): Payer: Medicare HMO | Admitting: Family Medicine

## 2017-08-03 ENCOUNTER — Ambulatory Visit: Payer: Self-pay

## 2017-08-03 VITALS — BP 120/84 | HR 78 | Ht 64.0 in | Wt 149.0 lb

## 2017-08-03 DIAGNOSIS — M25562 Pain in left knee: Principal | ICD-10-CM

## 2017-08-03 DIAGNOSIS — G8929 Other chronic pain: Secondary | ICD-10-CM | POA: Diagnosis not present

## 2017-08-03 DIAGNOSIS — M1712 Unilateral primary osteoarthritis, left knee: Secondary | ICD-10-CM | POA: Insufficient documentation

## 2017-08-03 MED ORDER — LISINOPRIL 10 MG PO TABS: 10.0000 mg | ORAL_TABLET | Freq: Every day | ORAL | 1 refills | Status: DC

## 2017-08-03 NOTE — Telephone Encounter (Signed)
Pt states her  lisinopril (PRINIVIL,ZESTRIL) 10 MG tablet  Increased to 1 a day. Needs new Rx sent to  [facility], [place]

## 2017-08-03 NOTE — Assessment & Plan Note (Signed)
Patient given injection today. Discussed topical anti-inflammatories, icing regimen, over-the-counter medications. Patient with continue her other medications. Worsening symptoms we'll consider viscous supplementation. Patient will be given a custom brace second due to abnormal thigh to calf ratio. Follow-up in 4 weeks

## 2017-08-03 NOTE — Addendum Note (Signed)
Addended by: [name] on: 08/03/2017 11:42 AM   Modules accepted: Level of Service

## 2017-08-03 NOTE — Patient Instructions (Signed)
Good to see you  Ice 20 minutes 2 times daily. Usually after activity and before bed. Exercises 3 times a week.  They will call you on the brace Tylenol 500mg  3 times a day  Turmeric 500mg  twice daily  Tart cherry extract at night any dose.  See me again in 4 weeks and lets see how you are doing!

## 2017-08-03 NOTE — Telephone Encounter (Signed)
Rx sent in.   Per Cory's note on 8/9 at patient's physical, she was to increase her Lisinopril to 10 mg daily.

## 2017-08-18 DIAGNOSIS — Z0100 Encounter for examination of eyes and vision without abnormal findings: Secondary | ICD-10-CM | POA: Diagnosis not present

## 2017-08-18 LAB — HM DIABETES EYE EXAM

## 2017-08-23 ENCOUNTER — Telehealth: Payer: Self-pay | Admitting: Adult Health

## 2017-08-23 NOTE — Telephone Encounter (Signed)
Pt called in and said that she was suppose to get some sample of pensaid when they came in?

## 2017-08-24 NOTE — Telephone Encounter (Signed)
Left msg making pt aware samples are at front desk ready to be picked up.

## 2017-09-01 ENCOUNTER — Ambulatory Visit: Payer: Medicare HMO | Admitting: Family Medicine

## 2017-09-01 DIAGNOSIS — M1712 Unilateral primary osteoarthritis, left knee: Secondary | ICD-10-CM | POA: Diagnosis not present

## 2017-09-15 ENCOUNTER — Telehealth: Payer: Self-pay | Admitting: Adult Health

## 2017-09-15 NOTE — Telephone Encounter (Signed)
° ° ° °  Cathy [name] pt call to ask if you would call her. She has a question about going to [facility] to do a urine test

## 2017-09-16 NOTE — Telephone Encounter (Signed)
Left a message for a return call.

## 2017-09-16 NOTE — Telephone Encounter (Signed)
Pt calling you back. Please call

## 2017-09-17 NOTE — Telephone Encounter (Signed)
Left a message for a return call.

## 2017-09-22 NOTE — Telephone Encounter (Signed)
Left a message for a return call.  Have tried multiple times to reach the pt.  Will now close the note. 

## 2017-09-23 ENCOUNTER — Ambulatory Visit: Payer: Medicare HMO | Admitting: Adult Health

## 2017-09-24 ENCOUNTER — Ambulatory Visit (INDEPENDENT_AMBULATORY_CARE_PROVIDER_SITE_OTHER): Payer: Medicare HMO | Admitting: Adult Health

## 2017-09-24 ENCOUNTER — Encounter: Payer: Self-pay | Admitting: Adult Health

## 2017-09-24 VITALS — BP 120/64 | Temp 98.1°F | Wt 152.0 lb

## 2017-09-24 DIAGNOSIS — E118 Type 2 diabetes mellitus with unspecified complications: Secondary | ICD-10-CM

## 2017-09-24 LAB — POCT GLYCOSYLATED HEMOGLOBIN (HGB A1C): Hemoglobin A1C: 6.4

## 2017-09-24 MED ORDER — NAPROXEN 500 MG PO TABS: 500.0000 mg | ORAL_TABLET | Freq: Two times a day (BID) | ORAL | 2 refills | Status: DC

## 2017-09-24 NOTE — Progress Notes (Signed)
Subjective:    Patient ID: Cathy [name], female    DOB: 1946-06-17, 71 y.o.   MRN: [id number]  HPI 71 year old female who  has a past medical history of Chronic constipation; Depression; Diabetes mellitus type II; DJD (degenerative joint disease); Female cystocele; GERD (gastroesophageal reflux disease); History of esophagitis; History of MRSA infection; History of recurrent UTIs; Hyperlipidemia; Hypertension; and Urgency of urination.  She presents to the office today for three month follow up regarding diabetes.her last A1c was done in August 2018 at which time it had increased from 6.2. To 6.7. She has been diet controlled for some time now.   Today in the office she reports that her blood sugar readings at home have been  Between 100- 120.    Review of Systems See HPI   Past Medical History:  Diagnosis Date  . Chronic constipation   . Depression   . Diabetes mellitus type II   . DJD (degenerative joint disease)    knees  . Female cystocele   . GERD (gastroesophageal reflux disease)   . History of esophagitis   . History of MRSA infection    recurrent carbuncle  . History of recurrent UTIs   . Hyperlipidemia   . Hypertension   . Urgency of urination     Social History   Social History  . Marital status: Single    Spouse name: N/A  . Number of children: N/A  . Years of education: N/A   Occupational History  . Not on file.   Social History Main Topics  . Smoking status: Former Smoker    Years: 1.00    Types: Cigarettes    Quit date: 02/28/1996  . Smokeless tobacco: Never Used  . Alcohol use 0.0 oz/week     Comment: occasional  . Drug use: No  . Sexual activity: Not on file   Other Topics Concern  . Not on file   Social History Narrative   Single   Former Smoker  -  quit 10 to 11 years ago (light smoker)   Alcohol use-yes     2 children    Occupation: [employer]     Past Surgical History:  Procedure Laterality Date  .  APPENDECTOMY  1980's  . BREAST EXCISIONAL BIOPSY Left 1989  . COLONOSCOPY  last one 06-18-2015  . INGUINAL HERNIA REPAIR Left 05-10-2001  . VAGINAL HYSTERECTOMY  1980's  . VAGINAL PROLAPSE REPAIR N/A 03/02/2016   Procedure: COLOPLAST ANTERIOR  VAULT REPAIR WITH AXIS DERMIS. SACROSPINUS FIXATION, AUGMENTATION WITH AXIS DERMIS;  Surgeon: Carolan Clines, MD;  Location: [facility];  Service: Urology;  Laterality: N/A;    Family History  Problem Relation Age of Onset  . Aneurysm Mother 5       deceased secondary to brain aneurysm  . Liver cancer Father 26       deceased  . Diabetes Unknown        grandmother  . Colon cancer Neg Hx   . Stomach cancer Neg Hx   . Rectal cancer Neg Hx   . Esophageal cancer Neg Hx     Allergies  Allergen Reactions  . Penicillins Hives    Current Outpatient Prescriptions on File Prior to Visit  Medication Sig Dispense Refill  . acetaminophen (TYLENOL) 325 MG tablet Take 650 mg by mouth every 6 (six) hours as needed for pain (pain).    [name] aspirin 81 MG tablet Take 81 mg by mouth  daily.      . Calcium Carbonate-Vitamin D (CALTRATE 600+D) 600-400 MG-UNIT per tablet Take 1 tablet by mouth daily.      [name] CRANBERRY PO Take by mouth.    [name] lisinopril (PRINIVIL,ZESTRIL) 10 MG tablet Take 1 tablet (10 mg total) by mouth daily. 90 tablet 1  . loratadine (CLARITIN) 10 MG tablet Take 10 mg by mouth daily.    Glory Rosebush DELICA LANCETS FINE MISC USE TO CHECK BLOOD SUGAR ONCE DAILY 100 each 3  . ONETOUCH VERIO test strip TEST once daily 100 each 1  . simvastatin (ZOCOR) 40 MG tablet take 1 tablet by mouth once daily AT 6 PM 90 tablet 3  . TURMERIC PO Take by mouth.    . diclofenac sodium (VOLTAREN) 1 % GEL Apply 2 g topically 4 (four) times daily. (Patient not taking: Reported on 09/24/2017) 100 g 3  . magnesium citrate SOLN Take 1 Bottle by mouth as needed for severe constipation.    . meloxicam (MOBIC) 15 MG tablet 1 tablet oral daily PRN  0  .  omeprazole (PRILOSEC) 20 MG capsule 1 capsule orally daily  0  . [DISCONTINUED] valsartan (DIOVAN) 160 MG tablet Take 1/2 tablet by mouth once daily 30 tablet 5   No current facility-administered medications on file prior to visit.     BP 120/64   Temp 98.1 F (36.7 C) (Oral)   Wt 152 lb (68.9 kg)   BMI 26.09 kg/m       Objective:   Physical Exam  Constitutional: She is oriented to person, place, and time. She appears well-developed and well-nourished. No distress.  Cardiovascular: Normal rate, regular rhythm, normal heart sounds and intact distal pulses.  Exam reveals no gallop and no friction rub.   No murmur heard. Pulmonary/Chest: Effort normal and breath sounds normal. No respiratory distress. She has no wheezes. She has no rales.  Neurological: She is alert and oriented to person, place, and time.  Skin: Skin is warm and dry. No rash noted. She is not diaphoretic. No erythema. No pallor.  Psychiatric: She has a normal mood and affect. Her behavior is normal. Judgment and thought content normal.  Nursing note and vitals reviewed.     Assessment & Plan:  1. Controlled type 2 diabetes mellitus with complication, without long-term current use of insulin (HCC)  - POCT A1C- has improved to 6.4  - Continue to eat healthy and exercise  - Follow up in 6 months for A1c check   [name], NP

## 2017-09-27 ENCOUNTER — Encounter (HOSPITAL_COMMUNITY): Payer: Self-pay

## 2017-09-30 ENCOUNTER — Ambulatory Visit: Payer: Medicare HMO | Admitting: Adult Health

## 2017-10-07 ENCOUNTER — Ambulatory Visit: Payer: Medicare HMO | Admitting: Family Medicine

## 2017-10-21 ENCOUNTER — Ambulatory Visit: Payer: Medicare HMO | Admitting: Family Medicine

## 2017-10-21 ENCOUNTER — Telehealth: Payer: Self-pay | Admitting: Adult Health

## 2017-10-21 ENCOUNTER — Encounter: Payer: Self-pay | Admitting: Family Medicine

## 2017-10-21 DIAGNOSIS — M170 Bilateral primary osteoarthritis of knee: Secondary | ICD-10-CM | POA: Diagnosis not present

## 2017-10-21 DIAGNOSIS — M1712 Unilateral primary osteoarthritis, left knee: Secondary | ICD-10-CM | POA: Insufficient documentation

## 2017-10-21 NOTE — Assessment & Plan Note (Signed)
Bilateral knee injections given today.  Tolerated the procedure well.  Could be a candidate for Visco supplementation.  Encouraged her to continue the home exercises, icing regimen.  Patient feels like she has some instability but would not wear custom brace at this time.  Does not want any surgical intervention.  Follow-up again in 4-6 weeks

## 2017-10-21 NOTE — Progress Notes (Signed)
[name] [place], [place] [address] Phone: [phone] Subjective:    I'm seeing this patient by the request  of:    CC: Knee pain follow-up  JKD:[id number]  Cathy [name] is a 71 y.o. female coming in with complaint of found to have left knee pain.  Seems to have severe arthritis.  Patient was given an injection August 03, 2017.  Patient was getting a custom brace and continuing conservative therapy.  Patient states that both knees are bothering her. She would injections in both knees.  Patient states that it is affecting daily activities.  Patient did have a birthday party the other day and unfortunately was unable to dance secondary to the pain       Past Medical History:  Diagnosis Date  . Chronic constipation   . Depression   . Diabetes mellitus type II   . DJD (degenerative joint disease)    knees  . Female cystocele   . GERD (gastroesophageal reflux disease)   . History of esophagitis   . History of MRSA infection    recurrent carbuncle  . History of recurrent UTIs   . Hyperlipidemia   . Hypertension   . Urgency of urination    Past Surgical History:  Procedure Laterality Date  . APPENDECTOMY  1980's  . BREAST EXCISIONAL BIOPSY Left 1989  . COLONOSCOPY  last one 06-18-2015  . INGUINAL HERNIA REPAIR Left 05-10-2001  . VAGINAL HYSTERECTOMY  1980's  . VAGINAL PROLAPSE REPAIR N/A 03/02/2016   Procedure: COLOPLAST ANTERIOR  VAULT REPAIR WITH AXIS DERMIS. SACROSPINUS FIXATION, AUGMENTATION WITH AXIS DERMIS;  Surgeon: Carolan Clines, MD;  Location: [facility];  Service: Urology;  Laterality: N/A;   Social History   Socioeconomic History  . Marital status: Single    Spouse name: Not on file  . Number of children: Not on file  . Years of education: Not on file  . Highest education level: Not on file  Social Needs  . Financial resource strain: Not on file  . Food insecurity - worry: Not on file    . Food insecurity - inability: Not on file  . Transportation needs - medical: Not on file  . Transportation needs - non-medical: Not on file  Occupational History  . Not on file  Tobacco Use  . Smoking status: Former Smoker    Years: 1.00    Types: Cigarettes    Last attempt to quit: 02/28/1996    Years since quitting: 21.6  . Smokeless tobacco: Never Used  Substance and Sexual Activity  . Alcohol use: Yes    Alcohol/week: 0.0 oz    Comment: occasional  . Drug use: No  . Sexual activity: Not on file  Other Topics Concern  . Not on file  Social History Narrative   Single   Former Smoker  -  quit 10 to 11 years ago (light smoker)   Alcohol use-yes     2 children    Occupation: [employer]    Allergies  Allergen Reactions  . Penicillins Hives   Family History  Problem Relation Age of Onset  . Aneurysm Mother 72       deceased secondary to brain aneurysm  . Liver cancer Father 78       deceased  . Diabetes Unknown        grandmother  . Colon cancer Neg Hx   . Stomach cancer Neg Hx   .  Rectal cancer Neg Hx   . Esophageal cancer Neg Hx      Past medical history, social, surgical and family history all reviewed in electronic medical record.  No pertanent information unless stated regarding to the chief complaint.   Review of Systems:Review of systems updated and as accurate as of 10/21/17  No headache, visual changes, nausea, vomiting, diarrhea, constipation, dizziness, abdominal pain, skin rash, fevers, chills, night sweats, weight loss, swollen lymph nodes, body aches, joint swelling, muscle aches, chest pain, shortness of breath, mood changes.   Objective  There were no vitals taken for this visit. Systems examined below as of 10/21/17   General: No apparent distress alert and oriented x3 mood and affect normal, dressed appropriately.  HEENT: Pupils equal, extraocular movements intact  Respiratory: Patient's speak in full sentences and does not  appear short of breath  Cardiovascular: No lower extremity edema, non tender, no erythema  Skin: Warm dry intact with no signs of infection or rash on extremities or on axial skeleton.  Abdomen: Soft nontender  Neuro: Cranial nerves II through XII are intact, neurovascularly intact in all extremities with 2+ DTRs and 2+ pulses.  Lymph: No lymphadenopathy of posterior or anterior cervical chain or axillae bilaterally.  Gait antalgic gait MSK:  Non tender with full range of motion and good stability and symmetric strength and tone of shoulders, elbows, wrist, hip, and ankles bilaterally.  Mild arthritic changes in multiple joints Knee: Bilateral valgus deformity noted.  Abnormal thigh to calf ratio.  Tender to palpation over medial and PF joint line.  ROM full in flexion and extension and lower leg rotation. instability with valgus force.  painful patellar compression. Patellar glide with moderate crepitus. Patellar and quadriceps tendons unremarkable. Hamstring and quadriceps strength is normal.  After informed written and verbal consent, patient was seated on exam table. Right knee was prepped with alcohol swab and utilizing anterolateral approach, patient's right knee space was injected with 4:1  marcaine 0.5%: Kenalog 40mg /dL. Patient tolerated the procedure well without immediate complications.  After informed written and verbal consent, patient was seated on exam table. Left knee was prepped with alcohol swab and utilizing anterolateral approach, patient's left knee space was injected with 4:1  marcaine 0.5%: Kenalog 40mg /dL. Patient tolerated the procedure well without immediate complications.   Impression and Recommendations:     This case required medical decision making of moderate complexity.      Note: This dictation was prepared with Dragon dictation along with smaller phrase technology. Any transcriptional errors that result from this process are unintentional.

## 2017-10-21 NOTE — Patient Instructions (Addendum)
Good to see you  Happy holidays!  Stay active Keep up with the vitamins See me again in 6 week sif still in pain or can repeat injections every 3 months if needed

## 2017-10-21 NOTE — Telephone Encounter (Signed)
Spoke to the pt.  She states her sister let her try some of her polyethylene glycol laxative tablets and they really helped her.  She has tried Metamucil and Miralax.  She stated both helped her for a short period but then she was 'clogged back up again."  Please advise.  Per pt, ok to leave a message when returning call due to pt being at work.

## 2017-10-21 NOTE — Telephone Encounter (Signed)
Copied from [place]. Topic: Quick Communication - See Telephone Encounter >> Oct 21, 2017 12:30 PM [place], Laban Emperor wrote: CRM for notification. See Telephone encounter for: Pt would like to know if the dr can call her a RX for polyethylene glycol laxative.  [id number] [address], [address] [phone] (Phone) [phone] (Fax)   10/21/17.

## 2017-10-22 ENCOUNTER — Other Ambulatory Visit: Payer: Self-pay | Admitting: Adult Health

## 2017-10-22 NOTE — Telephone Encounter (Signed)
Left a message as instructed by the pt.  Informed her that the polyethylene glycol is the same as Miralax.  Advise she pick that up OTC and call back as needed.  No further action needed at this time.  Will close the note.

## 2017-10-22 NOTE — Telephone Encounter (Signed)
That is miralax. They do not make it in a tablet. It comes in a powder form that is OTC

## 2017-10-29 ENCOUNTER — Ambulatory Visit: Payer: Medicare HMO | Admitting: Family Medicine

## 2017-10-29 ENCOUNTER — Encounter: Payer: Self-pay | Admitting: Family Medicine

## 2017-10-29 VITALS — BP 110/60 | HR 72 | Temp 97.6°F | Wt 155.4 lb

## 2017-10-29 DIAGNOSIS — N390 Urinary tract infection, site not specified: Secondary | ICD-10-CM | POA: Diagnosis not present

## 2017-10-29 DIAGNOSIS — N3281 Overactive bladder: Secondary | ICD-10-CM

## 2017-10-29 DIAGNOSIS — R350 Frequency of micturition: Secondary | ICD-10-CM

## 2017-10-29 LAB — POCT URINALYSIS DIPSTICK
BILIRUBIN UA: NEGATIVE
Glucose, UA: NEGATIVE
Ketones, UA: NEGATIVE
NITRITE UA: NEGATIVE
PH UA: 6 (ref 5.0–8.0)
PROTEIN UA: NEGATIVE
RBC UA: NEGATIVE
Spec Grav, UA: 1.025 (ref 1.010–1.025)
Urobilinogen, UA: 2 E.U./dL — AB

## 2017-10-29 MED ORDER — OXYBUTYNIN CHLORIDE ER 10 MG PO TB24: 10.0000 mg | ORAL_TABLET | Freq: Every day | ORAL | 0 refills | Status: DC

## 2017-10-29 MED ORDER — CIPROFLOXACIN HCL 500 MG PO TABS: 500.0000 mg | ORAL_TABLET | Freq: Two times a day (BID) | ORAL | 0 refills | Status: DC

## 2017-10-29 NOTE — Progress Notes (Signed)
   Subjective:    Patient ID: Cathy [name], female    DOB: 10/03/1946, 71 y.o.   MRN: [id number]  HPI Here for 3 days of urinary urgency and low back pain. No fever. She also mentions a long hx of urinary frequency, and she averages 2-3 trips to the bathroom every night.    Review of Systems  Constitutional: Negative.   Respiratory: Negative.   Cardiovascular: Negative.   Genitourinary: Positive for frequency and urgency. Negative for dysuria, flank pain and hematuria.       Objective:   Physical Exam  Constitutional: She appears well-developed and well-nourished.  HENT:  Right Ear: External ear normal.  Left Ear: External ear normal.  Nose: Nose normal.  Mouth/Throat: Oropharynx is clear and moist.  Eyes: Conjunctivae are normal.  Neck: No thyromegaly present.  Cardiovascular: Normal rate, regular rhythm, normal heart sounds and intact distal pulses.  Pulmonary/Chest: Effort normal and breath sounds normal. No respiratory distress. She has no wheezes. She has no rales.  Abdominal: Soft. Bowel sounds are normal. She exhibits no distension and no mass. There is no tenderness. There is no rebound and no guarding.  Lymphadenopathy:    She has no cervical adenopathy.          Assessment & Plan:  She has an acute UTI, treat with Cipro. Drink plenty of water. Culture the sample. She also seems to have some OAB. She will try Oxybutynin 10 mg qhs.  Alysia Penna, MD

## 2017-10-30 LAB — URINE CULTURE
MICRO NUMBER:: 81379640
SPECIMEN QUALITY:: ADEQUATE

## 2017-11-03 ENCOUNTER — Telehealth: Payer: Self-pay

## 2017-11-03 NOTE — Telephone Encounter (Signed)
Pt wanted Dr. Sarajane Jews to know that the medication Rx'd to help her with the frequent urination ( cipro)  is NOT helping any.

## 2017-11-04 ENCOUNTER — Telehealth: Payer: Self-pay | Admitting: Family Medicine

## 2017-11-04 NOTE — Telephone Encounter (Signed)
Sent to PCP ?

## 2017-11-04 NOTE — Telephone Encounter (Signed)
I spoke with pt and insurance did cover the Oxybutynin, however pt says its not working. How long does she need to try this medication?

## 2017-11-04 NOTE — Telephone Encounter (Signed)
Copied from [place]. Topic: Quick Communication - See Telephone Encounter >> Nov 04, 2017 11:20 AM Aurelio Brash B wrote: CRM for notification. See Telephone encounter for:  PT saw Dr Sarajane Jews last week and the oxybutynin he gave her is not working, she is requesting a different med that insurance will pay for  11/04/17. [facility] [address]

## 2017-11-05 NOTE — Telephone Encounter (Signed)
Spoke to the pt and scheduled her to see [facility] on 11/10/17 @ 8 AM.

## 2017-11-05 NOTE — Telephone Encounter (Signed)
I understand. Have her follow up with [facility]

## 2017-11-05 NOTE — Telephone Encounter (Signed)
I answered this in a different message. She should follow up with [name]

## 2017-11-05 NOTE — Telephone Encounter (Signed)
Follow up needed

## 2017-11-10 ENCOUNTER — Encounter: Payer: Self-pay | Admitting: Adult Health

## 2017-11-10 ENCOUNTER — Ambulatory Visit: Payer: Medicare HMO | Admitting: Adult Health

## 2017-11-10 VITALS — Temp 97.5°F | Wt 155.0 lb

## 2017-11-10 DIAGNOSIS — N3281 Overactive bladder: Secondary | ICD-10-CM | POA: Diagnosis not present

## 2017-11-10 DIAGNOSIS — R350 Frequency of micturition: Secondary | ICD-10-CM

## 2017-11-10 LAB — POC URINALSYSI DIPSTICK (AUTOMATED)
Bilirubin, UA: NEGATIVE
Glucose, UA: NEGATIVE
Ketones, UA: NEGATIVE
Leukocytes, UA: NEGATIVE
Nitrite, UA: NEGATIVE
PH UA: 6 (ref 5.0–8.0)
PROTEIN UA: NEGATIVE
RBC UA: NEGATIVE
SPEC GRAV UA: 1.01 (ref 1.010–1.025)
UROBILINOGEN UA: 0.2 U/dL

## 2017-11-10 MED ORDER — MIRABEGRON ER 25 MG PO TB24: 25.0000 mg | ORAL_TABLET | Freq: Every day | ORAL | 1 refills | Status: DC

## 2017-11-10 NOTE — Progress Notes (Signed)
Subjective:    Patient ID: Cathy [name], female    DOB: 05/04/1946, 71 y.o.   MRN: [id number]  HPI  71 year old female who  has a past medical history of Chronic constipation, Depression, Diabetes mellitus type II, DJD (degenerative joint disease), Female cystocele, GERD (gastroesophageal reflux disease), History of esophagitis, History of MRSA infection, History of recurrent UTIs, Hyperlipidemia, Hypertension, and Urgency of urination.  She presents to the office today for the complaint of continued urinary frequency and urgency. She was seen by another provider about a week ago and was thought to have a UTI as well as overactive bladder. She was treated with Cipro but UC came back negative. She was also started on Detrol LA.   Today in the office she reports that she continues to have frequency, especially at night. She denies any other UTI like symptoms. She denies any incontinence. She stopped the Detrol LA after about 6 days because of dry mouth and she did not feel as though it is working.   Review of Systems See HPI   Past Medical History:  Diagnosis Date  . Chronic constipation   . Depression   . Diabetes mellitus type II   . DJD (degenerative joint disease)    knees  . Female cystocele   . GERD (gastroesophageal reflux disease)   . History of esophagitis   . History of MRSA infection    recurrent carbuncle  . History of recurrent UTIs   . Hyperlipidemia   . Hypertension   . Urgency of urination     Social History   Socioeconomic History  . Marital status: Single    Spouse name: Not on file  . Number of children: Not on file  . Years of education: Not on file  . Highest education level: Not on file  Social Needs  . Financial resource strain: Not on file  . Food insecurity - worry: Not on file  . Food insecurity - inability: Not on file  . Transportation needs - medical: Not on file  . Transportation needs - non-medical: Not on file  Occupational History    . Not on file  Tobacco Use  . Smoking status: Former Smoker    Years: 1.00    Types: Cigarettes    Last attempt to quit: 02/28/1996    Years since quitting: 21.7  . Smokeless tobacco: Never Used  Substance and Sexual Activity  . Alcohol use: Yes    Alcohol/week: 0.0 oz    Comment: occasional  . Drug use: No  . Sexual activity: Not on file  Other Topics Concern  . Not on file  Social History Narrative   Single   Former Smoker  -  quit 10 to 11 years ago (light smoker)   Alcohol use-yes     2 children    Occupation: [employer]     Past Surgical History:  Procedure Laterality Date  . APPENDECTOMY  1980's  . BREAST EXCISIONAL BIOPSY Left 1989  . COLONOSCOPY  last one 06-18-2015  . INGUINAL HERNIA REPAIR Left 05-10-2001  . VAGINAL HYSTERECTOMY  1980's  . VAGINAL PROLAPSE REPAIR N/A 03/02/2016   Procedure: COLOPLAST ANTERIOR  VAULT REPAIR WITH AXIS DERMIS. SACROSPINUS FIXATION, AUGMENTATION WITH AXIS DERMIS;  Surgeon: Carolan Clines, MD;  Location: [facility];  Service: Urology;  Laterality: N/A;    Family History  Problem Relation Age of Onset  . Aneurysm Mother 73  deceased secondary to brain aneurysm  . Liver cancer Father 22       deceased  . Diabetes Unknown        grandmother  . Colon cancer Neg Hx   . Stomach cancer Neg Hx   . Rectal cancer Neg Hx   . Esophageal cancer Neg Hx     Allergies  Allergen Reactions  . Penicillins Hives    Current Outpatient Medications on File Prior to Visit  Medication Sig Dispense Refill  . acetaminophen (TYLENOL) 325 MG tablet Take 650 mg by mouth every 6 (six) hours as needed for pain (pain).    [name] aspirin 81 MG tablet Take 81 mg by mouth daily.      . Calcium Carbonate-Vitamin D (CALTRATE 600+D) 600-400 MG-UNIT per tablet Take 1 tablet by mouth daily.      [name] CRANBERRY PO Take by mouth.    [name] lisinopril (PRINIVIL,ZESTRIL) 10 MG tablet Take 1 tablet (10 mg total) by mouth daily. 90 tablet  1  . loratadine (CLARITIN) 10 MG tablet Take 10 mg by mouth daily.    . magnesium citrate SOLN Take 1 Bottle by mouth as needed for severe constipation.    . naproxen (NAPROSYN) 500 MG tablet Take 1 tablet (500 mg total) by mouth 2 (two) times daily with a meal. 30 tablet 2  . omeprazole (PRILOSEC) 20 MG capsule 1 capsule orally daily  0  . ONETOUCH DELICA LANCETS FINE MISC USE TO CHECK BLOOD SUGAR ONCE DAILY 100 each 3  . ONETOUCH VERIO test strip TEST once daily 100 each 1  . simvastatin (ZOCOR) 40 MG tablet take 1 tablet by mouth once daily AT 6 PM 90 tablet 3  . TURMERIC PO Take by mouth.    . [DISCONTINUED] valsartan (DIOVAN) 160 MG tablet Take 1/2 tablet by mouth once daily 30 tablet 5   No current facility-administered medications on file prior to visit.     Temp (!) 97.5 F (36.4 C) (Oral)   Wt 155 lb (70.3 kg)   BMI 26.61 kg/m       Objective:   Physical Exam  Constitutional: She is oriented to person, place, and time. She appears well-developed and well-nourished.  Cardiovascular: Normal rate, regular rhythm, normal heart sounds and intact distal pulses. Exam reveals no gallop.  No murmur heard. Pulmonary/Chest: Effort normal and breath sounds normal. No respiratory distress. She has no wheezes. She has no rales. She exhibits no tenderness.  Abdominal: Soft. Bowel sounds are normal. She exhibits no distension and no mass. There is no tenderness. There is no rebound, no guarding and no CVA tenderness.  Neurological: She is alert and oriented to person, place, and time.  Skin: Skin is warm and dry. No rash noted. She is not diaphoretic. No erythema. No pallor.  Psychiatric: She has a normal mood and affect. Her behavior is normal. Judgment and thought content normal.  Nursing note and vitals reviewed.     Assessment & Plan:  1. Urinary frequency - UA negative. Appears to be OAB. Will prescribe Myrbetriq and see how she does on this. May need to follow up with her  urologist if not resolved as she has a history of bladder surgery  - mirabegron ER (MYRBETRIQ) 25 MG TB24 tablet; Take 1 tablet (25 mg total) by mouth daily.  Dispense: 30 tablet; Refill: 1 - POCT Urinalysis Dipstick (Automated)   [name], NP

## 2017-11-11 ENCOUNTER — Other Ambulatory Visit: Payer: Self-pay | Admitting: Adult Health

## 2017-11-11 ENCOUNTER — Telehealth: Payer: Self-pay | Admitting: Adult Health

## 2017-11-11 MED ORDER — TOLTERODINE TARTRATE ER 2 MG PO CP24: 2.0000 mg | ORAL_CAPSULE | Freq: Every day | ORAL | 2 refills | Status: DC

## 2017-11-11 NOTE — Telephone Encounter (Signed)
Assessment & Plan:  1. Urinary frequency - UA negative. Appears to be OAB. Will prescribe Myrbetriq and see how she does on this. May need to follow up with her urologist if not resolved as she has a history of bladder surgery  - mirabegron ER (MYRBETRIQ) 25 MG TB24 tablet; Take 1 tablet (25 mg total) by mouth daily.  Dispense: 30 tablet; Refill: 1 - POCT Urinalysis Dipstick (Automated)   [name], NP    Do you have samples of Myrbetriq?

## 2017-11-11 NOTE — Telephone Encounter (Signed)
Medication patient was given at her visit on 11/11/17 wasn't covered under her insurance.  Patient states [name] mentioned giving her samples when some came in or if there is a medication that is covered under [employer].

## 2017-11-11 NOTE — Telephone Encounter (Signed)
Called and spoke to the pt.  Informed her that Cathy [name] has sent in new rx.  Call back if needed.

## 2017-11-11 NOTE — Telephone Encounter (Signed)
I have sent in Detrol LA

## 2017-12-02 ENCOUNTER — Emergency Department (HOSPITAL_COMMUNITY): Payer: Medicare HMO

## 2017-12-02 ENCOUNTER — Emergency Department (HOSPITAL_COMMUNITY)
Admission: EM | Admit: 2017-12-02 | Discharge: 2017-12-02 | Disposition: A | Discharge status: Home / Self Care | Payer: Medicare HMO | Attending: Emergency Medicine | Admitting: Emergency Medicine

## 2017-12-02 ENCOUNTER — Other Ambulatory Visit: Payer: Self-pay

## 2017-12-02 ENCOUNTER — Encounter (HOSPITAL_COMMUNITY): Payer: Self-pay | Admitting: Emergency Medicine

## 2017-12-02 DIAGNOSIS — Y999 Unspecified external cause status: Secondary | ICD-10-CM | POA: Diagnosis not present

## 2017-12-02 DIAGNOSIS — Z79899 Other long term (current) drug therapy: Secondary | ICD-10-CM | POA: Diagnosis not present

## 2017-12-02 DIAGNOSIS — E119 Type 2 diabetes mellitus without complications: Secondary | ICD-10-CM | POA: Diagnosis not present

## 2017-12-02 DIAGNOSIS — W2209XA Striking against other stationary object, initial encounter: Secondary | ICD-10-CM | POA: Insufficient documentation

## 2017-12-02 DIAGNOSIS — S0990XA Unspecified injury of head, initial encounter: Secondary | ICD-10-CM

## 2017-12-02 DIAGNOSIS — R51 Headache: Secondary | ICD-10-CM | POA: Diagnosis not present

## 2017-12-02 DIAGNOSIS — S0101XA Laceration without foreign body of scalp, initial encounter: Secondary | ICD-10-CM | POA: Diagnosis not present

## 2017-12-02 DIAGNOSIS — Z7982 Long term (current) use of aspirin: Secondary | ICD-10-CM | POA: Diagnosis not present

## 2017-12-02 DIAGNOSIS — Z87891 Personal history of nicotine dependence: Secondary | ICD-10-CM | POA: Insufficient documentation

## 2017-12-02 DIAGNOSIS — Y92009 Unspecified place in unspecified non-institutional (private) residence as the place of occurrence of the external cause: Secondary | ICD-10-CM | POA: Diagnosis not present

## 2017-12-02 DIAGNOSIS — Y9389 Activity, other specified: Secondary | ICD-10-CM | POA: Insufficient documentation

## 2017-12-02 DIAGNOSIS — I493 Ventricular premature depolarization: Secondary | ICD-10-CM | POA: Diagnosis not present

## 2017-12-02 DIAGNOSIS — I10 Essential (primary) hypertension: Secondary | ICD-10-CM | POA: Diagnosis not present

## 2017-12-02 LAB — CBC
HCT: 41.1 % (ref 36.0–46.0)
HEMOGLOBIN: 13.3 g/dL (ref 12.0–15.0)
MCH: 25.8 pg — ABNORMAL LOW (ref 26.0–34.0)
MCHC: 32.4 g/dL (ref 30.0–36.0)
MCV: 79.8 fL (ref 78.0–100.0)
Platelets: 306 10*3/uL (ref 150–400)
RBC: 5.15 MIL/uL — AB (ref 3.87–5.11)
RDW: 15.3 % (ref 11.5–15.5)
WBC: 12.1 10*3/uL — AB (ref 4.0–10.5)

## 2017-12-02 LAB — URINALYSIS, ROUTINE W REFLEX MICROSCOPIC
BACTERIA UA: NONE SEEN
BILIRUBIN URINE: NEGATIVE
GLUCOSE, UA: NEGATIVE mg/dL
HGB URINE DIPSTICK: NEGATIVE
Ketones, ur: NEGATIVE mg/dL
NITRITE: NEGATIVE
Protein, ur: NEGATIVE mg/dL
Specific Gravity, Urine: 1.017 (ref 1.005–1.030)
pH: 5 (ref 5.0–8.0)

## 2017-12-02 LAB — BASIC METABOLIC PANEL
ANION GAP: 8 (ref 5–15)
BUN: 23 mg/dL — ABNORMAL HIGH (ref 6–20)
CO2: 24 mmol/L (ref 22–32)
Calcium: 9.6 mg/dL (ref 8.9–10.3)
Chloride: 104 mmol/L (ref 101–111)
Creatinine, Ser: 0.88 mg/dL (ref 0.44–1.00)
Glucose, Bld: 136 mg/dL — ABNORMAL HIGH (ref 65–99)
POTASSIUM: 3.8 mmol/L (ref 3.5–5.1)
SODIUM: 136 mmol/L (ref 135–145)

## 2017-12-02 LAB — TROPONIN I: Troponin I: <0.03 ng/mL (ref <0.03)

## 2017-12-02 LAB — CBG MONITORING, ED: GLUCOSE-CAPILLARY: 130 mg/dL — AB (ref 65–99)

## 2017-12-02 MED ORDER — LIDOCAINE-EPINEPHRINE 1 %-1:100000 IJ SOLN
10.0000 mL | Freq: Once | INTRAMUSCULAR | Status: DC
  Filled 2017-12-02: qty 10

## 2017-12-02 MED ORDER — LIDOCAINE-EPINEPHRINE (PF) 2 %-1:200000 IJ SOLN
INTRAMUSCULAR | Status: AC
  Filled 2017-12-02: qty 20

## 2017-12-02 NOTE — Discharge Instructions (Signed)
You have a single blue stitch in your scalp. This needs to be removed in 5-7 days.

## 2017-12-02 NOTE — ED Triage Notes (Signed)
Pt believes she passed out while cleaning curtains. Unsure what she hit. Laceration noted to right posterior head.

## 2017-12-02 NOTE — ED Provider Notes (Signed)
[facility] DEPT Provider Note   CSN: [id number] Arrival date & time: 12/02/17  4098     History   Chief Complaint Chief Complaint  Patient presents with  . Loss of Consciousness  . Head Injury    HPI Cathy [name] is a 72 y.o. female. Chief complaint is fall, head laceration.  HPI  CC Her feel. Was standing on her couch trying to adjust a curtain. She fell backwards. Check her head against a carpeted floor. She has her hair up and "ponytails". She has small laceration to base of one of the gatherings of her hair. States she was "out of it" for a few seconds. Does finally have recall of standing on the couch. She did not for proximal minute first half an hour afterwards. Her memory has recovered. Denies remembering any feelings of lightheadedness dizziness or symptoms prior to her fall. Does not had syncope in the past. Is not anticoagulated.   Past Medical History:  Diagnosis Date  . Chronic constipation   . Depression   . Diabetes mellitus type II   . DJD (degenerative joint disease)    knees  . Female cystocele   . GERD (gastroesophageal reflux disease)   . History of esophagitis   . History of MRSA infection    recurrent carbuncle  . History of recurrent UTIs   . Hyperlipidemia   . Hypertension   . Urgency of urination     Patient Active Problem List   Diagnosis Date Noted  . Degenerative arthritis of knee, bilateral 10/21/2017  . Arthritis of knee, left 08/03/2017  . GERD (gastroesophageal reflux disease) 03/27/2015  . Preventative health care 01/04/2015  . Constipation 07/15/2012  . Allergic rhinitis 04/01/2012  . Diabetes mellitus type 2, controlled ([place]) 10/14/2007  . CARPAL TUNNEL SYNDROME 10/14/2007  . Dyslipidemia 10/13/2007  . Essential hypertension 10/13/2007  . Depression, recurrent ([place]) 10/13/2007    Past Surgical History:  Procedure Laterality Date  . APPENDECTOMY  1980's  . BREAST EXCISIONAL BIOPSY Left  1989  . COLONOSCOPY  last one 06-18-2015  . INGUINAL HERNIA REPAIR Left 05-10-2001  . VAGINAL HYSTERECTOMY  1980's  . VAGINAL PROLAPSE REPAIR N/A 03/02/2016   Procedure: COLOPLAST ANTERIOR  VAULT REPAIR WITH AXIS DERMIS. SACROSPINUS FIXATION, AUGMENTATION WITH AXIS DERMIS;  Surgeon: Carolan Clines, MD;  Location: [facility];  Service: Urology;  Laterality: N/A;    OB History    Gravida Para Term Preterm AB Living   3 2 2   1 2    SAB TAB Ectopic Multiple Live Births   1               Home Medications    Prior to Admission medications   Medication Sig Start Date End Date Taking? Authorizing Provider  acetaminophen (TYLENOL) 325 MG tablet Take 650 mg by mouth daily as needed (pain).    Yes [provider]  acetaminophen (TYLENOL) 650 MG CR tablet Take 650 mg by mouth daily as needed for pain.   Yes [provider]  aspirin 81 MG tablet Take 81 mg by mouth daily.     Yes [provider]  Calcium Carbonate-Vitamin D (CALTRATE 600+D) 600-400 MG-UNIT per tablet Take 1 tablet by mouth daily.     Yes [provider]  CRANBERRY PO Take 2 tablets by mouth daily.    Yes [provider]  lisinopril (PRINIVIL,ZESTRIL) 10 MG tablet Take 1 tablet (10 mg total) by mouth daily. 08/03/17  Yes Martinique, Betty G, MD  loratadine (CLARITIN) 10 MG tablet Take 10 mg by mouth daily.   Yes [provider]  magnesium citrate SOLN Take 1 Bottle by mouth as needed for severe constipation.   Yes [provider]  Misc Natural Products (TART CHERRY ADVANCED) CAPS Take 1 capsule by mouth daily.   Yes [provider]  MYRBETRIQ 25 MG TB24 tablet Take 25 mg by mouth daily. 11/29/17  Yes [provider]  naproxen (NAPROSYN) 500 MG tablet Take 1 tablet (500 mg total) by mouth 2 (two) times daily with a meal. 09/24/17  Yes Nafziger, [name], NP  naproxen sodium (ALEVE) 220 MG tablet Take 220 mg by mouth.   Yes [provider]  oxybutynin (DITROPAN-XL) 10 MG 24 hr tablet Take 10 mg by mouth at bedtime. 10/29/17  Yes [provider]  polyethylene glycol (MIRALAX / GLYCOLAX) packet Take 17 g by mouth daily as needed for mild constipation.   Yes [provider]  simvastatin (ZOCOR) 40 MG tablet take 1 tablet by mouth once daily AT 6 PM 03/26/17  Yes Nafziger, [name], NP  Tetrahydrozoline HCl (VISINE OP) Apply 1 drop to eye daily as needed (RED EYE).   Yes [provider]  tolterodine (DETROL LA) 2 MG 24 hr capsule Take 1 capsule (2 mg total) by mouth daily. 11/11/17  Yes Nafziger, [name], NP  TURMERIC PO Take 1 tablet by mouth daily.    Yes [provider]  [facility] DELICA LANCETS FINE MISC USE TO CHECK BLOOD SUGAR ONCE DAILY 04/13/16   Lucretia Kern, DO  [facility] VERIO test strip TEST once daily 06/07/17   [name], NP    Family History Family History  Problem Relation Age of Onset  . Aneurysm Mother 19       deceased secondary to brain aneurysm  . Liver cancer Father 67       deceased  . Diabetes Unknown        grandmother  . Colon cancer Neg Hx   . Stomach cancer Neg Hx   . Rectal cancer Neg Hx   . Esophageal cancer Neg Hx     Social History Social History   Tobacco Use  . Smoking status: Former Smoker    Years: 1.00    Types: Cigarettes    Last attempt to quit: 02/28/1996    Years since quitting: 21.7  . Smokeless tobacco: Never Used  Substance Use Topics  . Alcohol use: Yes    Alcohol/week: 0.0 oz    Comment: occasional  . Drug use: No     Allergies   Penicillins   Review of Systems Review of Systems  Constitutional: Negative for appetite change, chills, diaphoresis, fatigue and fever.  HENT: Negative for mouth sores, sore throat and trouble swallowing.   Eyes: Negative for visual disturbance.  Respiratory: Negative for cough, chest tightness, shortness of breath and wheezing.   Cardiovascular: Negative for chest pain.  Gastrointestinal: Negative  for abdominal distention, abdominal pain, diarrhea, nausea and vomiting.  Endocrine: Negative for polydipsia, polyphagia and polyuria.  Genitourinary: Negative for dysuria, frequency and hematuria.  Musculoskeletal: Negative for gait problem.  Skin: Positive for wound. Negative for color change, pallor and rash.  Neurological: Positive for headaches. Negative for dizziness, syncope and light-headedness.  Hematological: Does not bruise/bleed easily.  Psychiatric/Behavioral: Negative for behavioral problems and confusion.     Physical Exam Updated Vital Signs BP 133/62   Pulse 73   Temp 98.3 F (  36.8 C) (Oral)   Resp 20   SpO2 100%   Physical Exam  Constitutional: She is oriented to person, place, and time. She appears well-developed and well-nourished. No distress.  HENT:  Head: Normocephalic.  Less than 1 similar area of bleeding almost pinpoint area of bleeding coming from the scalp just underneath an area of abraded hair. No blood over the TMs or mastoids. No blood from ears nose or mouth.  Eyes: Conjunctivae are normal. Pupils are equal, round, and reactive to light. No scleral icterus.  Neck: Normal range of motion. Neck supple. No thyromegaly present.  Cardiovascular: Normal rate and regular rhythm. Exam reveals no gallop and no friction rub.  No murmur heard. Pulmonary/Chest: Effort normal and breath sounds normal. No respiratory distress. She has no wheezes. She has no rales.  Abdominal: Soft. Bowel sounds are normal. She exhibits no distension. There is no tenderness. There is no rebound.  Musculoskeletal: Normal range of motion.  Neurological: She is alert and oriented to person, place, and time.  Skin: Skin is warm and dry. No rash noted.  Psychiatric: She has a normal mood and affect. Her behavior is normal.     ED Treatments / Results  Labs (all labs ordered are listed, but only abnormal results are displayed) Labs Reviewed  BASIC METABOLIC PANEL - Abnormal;  Notable for the following components:      Result Value   Glucose, Bld 136 (*)    BUN 23 (*)    All other components within normal limits  CBC - Abnormal; Notable for the following components:   WBC 12.1 (*)    RBC 5.15 (*)    MCH 25.8 (*)    All other components within normal limits  URINALYSIS, ROUTINE W REFLEX MICROSCOPIC - Abnormal; Notable for the following components:   Leukocytes, UA SMALL (*)    Squamous Epithelial / LPF 0-5 (*)    All other components within normal limits  CBG MONITORING, ED - Abnormal; Notable for the following components:   Glucose-Capillary 130 (*)    All other components within normal limits  TROPONIN I    EKG  EKG Interpretation  Date/Time:  Thursday December 02 2017 10:26:18 EST Ventricular Rate:  86 PR Interval:    QRS Duration: 93 QT Interval:  356 QTC Calculation: 426 R Axis:   60 Text Interpretation:  Sinus rhythm Ventricular premature complex Probable left atrial enlargement Baseline wander in lead(s) V4 V6 Confirmed by Tanna Furry [phone]) on 12/02/2017 11:08:23 AM       Radiology Ct Head Wo Contrast  Result Date: 12/02/2017 CLINICAL DATA:  Head trauma, headache. Syncopal episode with trauma to head. EXAM: CT HEAD WITHOUT CONTRAST TECHNIQUE: Contiguous axial images were obtained from the base of the skull through the vertex without intravenous contrast. COMPARISON:  None. FINDINGS: Brain: No acute infarct or hemorrhage is present. Mild atrophy is present. White matter changes are mildly advanced for age. This is most evident in the external capsule, right greater than left. The basal ganglia are otherwise intact. Insular ribbon is normal. No discrete cortical lesions are present. A left paramedian calcified meningioma over the parietal lobe convexity measures 10 x 13 x 14 mm. No other mass lesion is present. There is no significant edema surrounding this lesion. Vascular: No hyperdense vessel or unexpected calcification. Skull: A right parietal  scalp hematoma and laceration is present. There is no underlying fracture. No acute or healing fractures are present. No focal lytic or blastic lesions are present.  Sinuses/Orbits: The paranasal sinuses and mastoid air cells are clear. The globes and orbits are within normal limits. IMPRESSION: 1. Right parietal scalp laceration and hematoma without underlying fracture. 2. No acute intracranial abnormality or intracranial trauma. 3. 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 Electronically Signed   By: San Morelle M.D.   On: 12/02/2017 10:50    Procedures Procedures (including critical care time)  Medications Ordered in ED Medications  lidocaine-EPINEPHrine (XYLOCAINE W/EPI) 1 %-1:100000 (with pres) injection 10 mL (not administered)  lidocaine-EPINEPHrine (XYLOCAINE W/EPI) 2 %-1:200000 (PF) injection (not administered)     Initial Impression / Assessment and Plan / ED Course  I have reviewed the triage vital signs and the nursing notes.  Pertinent labs & imaging results that were available during my care of the patient were reviewed by me and considered in my medical decision making (see chart for details).   EKG, labs, CT and EKG all normal. LACERATION REPAIR Performed by: Lolita Patella Authorized by: Lolita Patella Consent: Verbal consent obtained. Risks and benefits: risks, benefits and alternatives were discussed Consent given by: patient Patient identity confirmed: provided demographic data Prepped and Draped in normal sterile fashion Wound explored  Laceration Location: 77mm right occipital acalp  Laceration Length: 15mm   No Foreign Bodies seen or palpated  Anesthesia: local infiltration  Local anesthetic: lidocaine 2% c epinephrine  Anesthetic total: 3 ml  Irrigation method: syringe Amount of cleaning: standard  Skin closure: yes  Number of sutures: 1  Technique: simple 5-0 Prolene  Patient tolerance: Patient tolerated the procedure  well with no immediate complications.    Final Clinical Impressions(s) / ED Diagnoses   Final diagnoses:  Injury of head, initial encounter  Laceration of scalp, initial encounter    ED Discharge Orders    None       Tanna Furry, MD 12/02/17 1200

## 2017-12-09 ENCOUNTER — Ambulatory Visit (INDEPENDENT_AMBULATORY_CARE_PROVIDER_SITE_OTHER): Payer: Medicare HMO | Admitting: Family Medicine

## 2017-12-09 ENCOUNTER — Encounter: Payer: Self-pay | Admitting: Family Medicine

## 2017-12-09 VITALS — BP 130/68 | HR 74 | Temp 97.5°F | Wt 154.0 lb

## 2017-12-09 DIAGNOSIS — M545 Low back pain, unspecified: Secondary | ICD-10-CM

## 2017-12-09 DIAGNOSIS — S0101XA Laceration without foreign body of scalp, initial encounter: Secondary | ICD-10-CM | POA: Diagnosis not present

## 2017-12-09 DIAGNOSIS — Z4889 Encounter for other specified surgical aftercare: Secondary | ICD-10-CM

## 2017-12-09 LAB — POC URINALSYSI DIPSTICK (AUTOMATED)
BILIRUBIN UA: NEGATIVE
GLUCOSE UA: NEGATIVE
KETONES UA: NEGATIVE
Protein, UA: NEGATIVE
RBC UA: NEGATIVE
SPEC GRAV UA: 1.02 (ref 1.010–1.025)
Urobilinogen, UA: NEGATIVE E.U./dL — AB
pH, UA: 6 (ref 5.0–8.0)

## 2017-12-09 MED ORDER — NAPROXEN 500 MG PO TABS: 500.0000 mg | ORAL_TABLET | Freq: Two times a day (BID) | ORAL | 0 refills | Status: DC

## 2017-12-09 NOTE — Progress Notes (Signed)
HPI:  Acute visit for suture removal scalp.  Went to emergency room on 110 for mechanical fall with small laceration of the scalp.  She had CT scans, EKGs and labs all which looked normal.  She reports she is feeling better, no further dizziness or loss of consciousness.  She has had a little bit of back pain wants to check her urine.  No radiation or weakness.  No urinary symptoms, fevers or malaise.  She does still have some tenderness at the site where she hit her head.  No bleeding or drainage.  Reports they put 1 suture in.  ROS: See pertinent positives and negatives per HPI.  Past Medical History:  Diagnosis Date  . Chronic constipation   . Depression   . Diabetes mellitus type II   . DJD (degenerative joint disease)    knees  . Female cystocele   . GERD (gastroesophageal reflux disease)   . History of esophagitis   . History of MRSA infection    recurrent carbuncle  . History of recurrent UTIs   . Hyperlipidemia   . Hypertension   . Urgency of urination     Past Surgical History:  Procedure Laterality Date  . APPENDECTOMY  1980's  . BREAST EXCISIONAL BIOPSY Left 1989  . COLONOSCOPY  last one 06-18-2015  . INGUINAL HERNIA REPAIR Left 05-10-2001  . VAGINAL HYSTERECTOMY  1980's  . VAGINAL PROLAPSE REPAIR N/A 03/02/2016   Procedure: COLOPLAST ANTERIOR  VAULT REPAIR WITH AXIS DERMIS. SACROSPINUS FIXATION, AUGMENTATION WITH AXIS DERMIS;  Surgeon: Carolan Clines, MD;  Location: [facility];  Service: Urology;  Laterality: N/A;    Family History  Problem Relation Age of Onset  . Aneurysm Mother 16       deceased secondary to brain aneurysm  . Liver cancer Father 70       deceased  . Diabetes Unknown        grandmother  . Colon cancer Neg Hx   . Stomach cancer Neg Hx   . Rectal cancer Neg Hx   . Esophageal cancer Neg Hx     Social History   Socioeconomic History  . Marital status: Single    Spouse name: None  . Number of children: None  . Years  of education: None  . Highest education level: None  Social Needs  . Financial resource strain: None  . Food insecurity - worry: None  . Food insecurity - inability: None  . Transportation needs - medical: None  . Transportation needs - non-medical: None  Occupational History  . None  Tobacco Use  . Smoking status: Former Smoker    Years: 1.00    Types: Cigarettes    Last attempt to quit: 02/28/1996    Years since quitting: 21.7  . Smokeless tobacco: Never Used  Substance and Sexual Activity  . Alcohol use: Yes    Alcohol/week: 0.0 oz    Comment: occasional  . Drug use: No  . Sexual activity: None  Other Topics Concern  . None  Social History Narrative   Single   Former Smoker  -  quit 10 to 11 years ago (light smoker)   Alcohol use-yes     2 children    Occupation: [place]      Current Outpatient Medications:  .  acetaminophen (TYLENOL) 325 MG tablet, Take 650 mg by mouth daily as needed (pain). , Disp: , Rfl:  .  acetaminophen (TYLENOL) 650 MG CR tablet, Take 650  mg by mouth daily as needed for pain., Disp: , Rfl:  .  aspirin 81 MG tablet, Take 81 mg by mouth daily.  , Disp: , Rfl:  .  Calcium Carbonate-Vitamin D (CALTRATE 600+D) 600-400 MG-UNIT per tablet, Take 1 tablet by mouth daily.  , Disp: , Rfl:  .  CRANBERRY PO, Take 2 tablets by mouth daily. , Disp: , Rfl:  .  lisinopril (PRINIVIL,ZESTRIL) 10 MG tablet, Take 1 tablet (10 mg total) by mouth daily., Disp: 90 tablet, Rfl: 1 .  loratadine (CLARITIN) 10 MG tablet, Take 10 mg by mouth daily., Disp: , Rfl:  .  magnesium citrate SOLN, Take 1 Bottle by mouth as needed for severe constipation., Disp: , Rfl:  .  Misc Natural Products (TART CHERRY ADVANCED) CAPS, Take 1 capsule by mouth daily., Disp: , Rfl:  .  MYRBETRIQ 25 MG TB24 tablet, Take 25 mg by mouth daily., Disp: , Rfl: 0 .  naproxen (NAPROSYN) 500 MG tablet, Take 1 tablet (500 mg total) by mouth 2 (two) times daily with a meal. Only as needed  for pain., Disp: 15 tablet, Rfl: 0 .  ONETOUCH DELICA LANCETS FINE MISC, USE TO CHECK BLOOD SUGAR ONCE DAILY, Disp: 100 each, Rfl: 3 .  ONETOUCH VERIO test strip, TEST once daily, Disp: 100 each, Rfl: 1 .  oxybutynin (DITROPAN-XL) 10 MG 24 hr tablet, Take 10 mg by mouth at bedtime., Disp: , Rfl: 0 .  polyethylene glycol (MIRALAX / GLYCOLAX) packet, Take 17 g by mouth daily as needed for mild constipation., Disp: , Rfl:  .  simvastatin (ZOCOR) 40 MG tablet, take 1 tablet by mouth once daily AT 6 PM, Disp: 90 tablet, Rfl: 3 .  Tetrahydrozoline HCl (VISINE OP), Apply 1 drop to eye daily as needed (RED EYE)., Disp: , Rfl:  .  tolterodine (DETROL LA) 2 MG 24 hr capsule, Take 1 capsule (2 mg total) by mouth daily., Disp: 30 capsule, Rfl: 2 .  TURMERIC PO, Take 1 tablet by mouth daily. , Disp: , Rfl:   EXAM:  Vitals:   12/09/17 0837  BP: 130/68  Pulse: 74  Temp: (!) 97.5 F (36.4 C)  SpO2: 98%    Body mass index is 26.43 kg/m.  GENERAL: vitals reviewed and listed above, alert, oriented, appears well hydrated and in no acute distress  HEENT: atraumatic, conjunttiva clear, no obvious abnormalities on inspection of external nose and ears  NECK: no obvious masses on inspection  MS: moves all extremities without noticeable abnormality  SKIN: Small laceration with dried blood on the scalp and one suture.  No signs of separation. Good healing wound edges. No signs infection. Suture removed.  Small amount of bleeding at suture site.  PSYCH: pleasant and cooperative, no obvious depression or anxiety  ASSESSMENT AND PLAN:  Discussed the following assessment and plan:  Laceration of skin of scalp, initial encounter  Suture check  Low back pain without sciatica, unspecified back pain laterality, unspecified chronicity  -Suture removed, small amount of bleeding from suture site, hemostasis obtained with pressure, no signs of wound separation, dressing applied by assistant, advised patient  to seek care if any large amount of bleeding or persistent bleeding -Patient requests refill of her Naprosyn that her PCP gave her in the past-for occasional use for aches and pains, understands risks, she reports over-the-counter medications do not work -udip normal. Advised follow up with PCP if back pain persists over next 1 week or worsens. -Patient advised to return or notify  a doctor immediately if symptoms worsen or persist or new concerns arise.  Patient Instructions    Keep wound clean and dry for 24-48 hours.  Apply pressure if any small amount of bleeding.  Seek care if any persistent bleeding.     Colin Benton R., DO

## 2017-12-09 NOTE — Patient Instructions (Signed)
Keep wound clean and dry for 24-48 hours.  Apply pressure if any small amount of bleeding.  Seek care if any persistent bleeding.

## 2017-12-09 NOTE — Addendum Note (Signed)
Addended by: Wyvonne Lenz on: 12/09/2017 10:29 AM   Modules accepted: Orders

## 2017-12-15 ENCOUNTER — Telehealth: Payer: Self-pay | Admitting: Adult Health

## 2017-12-15 NOTE — Telephone Encounter (Signed)
CRM for notification. See Telephone encounter for:   12/15/17.  Caller name: Relation to pt: self Call back number: [phone]   Reason for call:  Patient last seen by Dr. Maudie Mercury 12/09/16 and states symptoms have not improved requesting imaging orders for  right side / and lower  back pain, please advise

## 2017-12-16 NOTE — Telephone Encounter (Signed)
I called the pt and scheduled an appt with [facility] for 1/30.

## 2017-12-16 NOTE — Telephone Encounter (Signed)
Recommend appointment with her PCP. Please schedule with PCP. Thanks.

## 2017-12-22 ENCOUNTER — Ambulatory Visit: Payer: Medicare HMO | Admitting: Adult Health

## 2017-12-23 ENCOUNTER — Other Ambulatory Visit: Payer: Self-pay | Admitting: Adult Health

## 2017-12-23 DIAGNOSIS — Z139 Encounter for screening, unspecified: Secondary | ICD-10-CM

## 2017-12-27 ENCOUNTER — Ambulatory Visit (INDEPENDENT_AMBULATORY_CARE_PROVIDER_SITE_OTHER): Payer: Medicare HMO | Admitting: Adult Health

## 2017-12-27 ENCOUNTER — Encounter: Payer: Self-pay | Admitting: Adult Health

## 2017-12-27 VITALS — BP 140/70 | Temp 98.1°F | Wt 157.0 lb

## 2017-12-27 DIAGNOSIS — M5431 Sciatica, right side: Secondary | ICD-10-CM | POA: Diagnosis not present

## 2017-12-27 MED ORDER — CYCLOBENZAPRINE HCL 5 MG PO TABS: 5.0000 mg | ORAL_TABLET | Freq: Three times a day (TID) | ORAL | 1 refills | Status: DC | PRN

## 2017-12-27 MED ORDER — METHYLPREDNISOLONE 4 MG PO TBPK
ORAL_TABLET | ORAL | 0 refills | Status: DC
Start: 2017-12-27 — End: 2018-01-13

## 2017-12-27 NOTE — Progress Notes (Signed)
Subjective:    Patient ID: Cathy [name], female    DOB: [birth year], 72 y.o.   MRN: [id number]  HPI  72 year old female who  has a past medical history of Chronic constipation, Depression, Diabetes mellitus type II, DJD (degenerative joint disease), Female cystocele, GERD (gastroesophageal reflux disease), History of esophagitis, History of MRSA infection, History of recurrent UTIs, Hyperlipidemia, Hypertension, and Urgency of urination.   She was seen on 10 2019 in the emergency room for mechanical fall with a small laceration to the scalp. She had CT scans EKG and labs all which were normal. She was seen 7 days later by another provider in the office for suture removal. At that time she and endorsed a slight amount of low back pain. She has been using prescribed anti-inflammatories and although the pain has not become any worse it has not gotten any better. Today in the office she reports that she continues to have right-sided low back pain with radiating pain travels down the outside of her right leg to the knee. This pain is described as "sharp and burning", pain is worse with ambulation and laying down. This discomfort does not cause any deviation in activities of daily living and she continues to exercise. She denies any urinary symptoms and has not noticed any issues with bowel.  Denies any numbness or tingling in her lower extremities.   Review of Systems See HPI   Past Medical History:  Diagnosis Date  . Chronic constipation   . Depression   . Diabetes mellitus type II   . DJD (degenerative joint disease)    knees  . Female cystocele   . GERD (gastroesophageal reflux disease)   . History of esophagitis   . History of MRSA infection    recurrent carbuncle  . History of recurrent UTIs   . Hyperlipidemia   . Hypertension   . Urgency of urination     Social History   Socioeconomic History  . Marital status: Single    Spouse name: Not on file  . Number of children:  Not on file  . Years of education: Not on file  . Highest education level: Not on file  Social Needs  . Financial resource strain: Not on file  . Food insecurity - worry: Not on file  . Food insecurity - inability: Not on file  . Transportation needs - medical: Not on file  . Transportation needs - non-medical: Not on file  Occupational History  . Not on file  Tobacco Use  . Smoking status: Former Smoker    Years: 1.00    Types: Cigarettes    Last attempt to quit: 02/28/1996    Years since quitting: 21.8  . Smokeless tobacco: Never Used  Substance and Sexual Activity  . Alcohol use: Yes    Alcohol/week: 0.0 oz    Comment: occasional  . Drug use: No  . Sexual activity: Not on file  Other Topics Concern  . Not on file  Social History Narrative   Single   Former Smoker  -  quit 10 to 11 years ago (light smoker)   Alcohol use-yes     2 children    Occupation: [employer]     Past Surgical History:  Procedure Laterality Date  . APPENDECTOMY  1980's  . BREAST EXCISIONAL BIOPSY Left 1989  . COLONOSCOPY  last one 06-18-2015  . INGUINAL HERNIA REPAIR Left 05-10-2001  . VAGINAL HYSTERECTOMY  1980's  . VAGINAL  PROLAPSE REPAIR N/A 03/02/2016   Procedure: COLOPLAST ANTERIOR  VAULT REPAIR WITH AXIS DERMIS. SACROSPINUS FIXATION, AUGMENTATION WITH AXIS DERMIS;  Surgeon: Carolan Clines, MD;  Location: [facility];  Service: Urology;  Laterality: N/A;    Family History  Problem Relation Age of Onset  . Aneurysm Mother 53       deceased secondary to brain aneurysm  . Liver cancer Father 16       deceased  . Diabetes Unknown        grandmother  . Colon cancer Neg Hx   . Stomach cancer Neg Hx   . Rectal cancer Neg Hx   . Esophageal cancer Neg Hx     Allergies  Allergen Reactions  . Penicillins Hives    Has patient had a PCN reaction causing immediate rash, facial/tongue/throat swelling, SOB or lightheadedness with hypotension: Yes Has patient  had a PCN reaction causing severe rash involving mucus membranes or skin necrosis: No Has patient had a PCN reaction that required hospitalization: No Has patient had a PCN reaction occurring within the last 10 years: No If all of the above answers are "NO", then may proceed with Cephalosporin use.     Current Outpatient Medications on File Prior to Visit  Medication Sig Dispense Refill  . aspirin 81 MG tablet Take 81 mg by mouth daily.      . Calcium Carbonate-Vitamin D (CALTRATE 600+D) 600-400 MG-UNIT per tablet Take 1 tablet by mouth daily.      [name] CRANBERRY PO Take 2 tablets by mouth daily.     [name] lisinopril (PRINIVIL,ZESTRIL) 10 MG tablet Take 1 tablet (10 mg total) by mouth daily. 90 tablet 1  . loratadine (CLARITIN) 10 MG tablet Take 10 mg by mouth daily.    . magnesium citrate SOLN Take 1 Bottle by mouth as needed for severe constipation.    . Misc Natural Products (TART CHERRY ADVANCED) CAPS Take 1 capsule by mouth daily.    [name] MYRBETRIQ 25 MG TB24 tablet Take 25 mg by mouth daily.  0  . naproxen (NAPROSYN) 500 MG tablet Take 1 tablet (500 mg total) by mouth 2 (two) times daily with a meal. Only as needed for pain. 15 tablet 0  . ONETOUCH DELICA LANCETS FINE MISC USE TO CHECK BLOOD SUGAR ONCE DAILY 100 each 3  . ONETOUCH VERIO test strip TEST once daily 100 each 1  . oxybutynin (DITROPAN-XL) 10 MG 24 hr tablet Take 10 mg by mouth at bedtime.  0  . polyethylene glycol (MIRALAX / GLYCOLAX) packet Take 17 g by mouth daily as needed for mild constipation.    . simvastatin (ZOCOR) 40 MG tablet take 1 tablet by mouth once daily AT 6 PM 90 tablet 3  . Tetrahydrozoline HCl (VISINE OP) Apply 1 drop to eye daily as needed (RED EYE).    [name] tolterodine (DETROL LA) 2 MG 24 hr capsule Take 1 capsule (2 mg total) by mouth daily. 30 capsule 2  . TURMERIC PO Take 1 tablet by mouth daily.     . [DISCONTINUED] valsartan (DIOVAN) 160 MG tablet Take 1/2 tablet by mouth once daily 30 tablet 5   No current  facility-administered medications on file prior to visit.     BP 140/70 (BP Location: Left Arm)   Temp 98.1 F (36.7 C) (Oral)   Wt 157 lb (71.2 kg)   BMI 26.95 kg/m       Objective:   Physical Exam  Constitutional: She is oriented  to person, place, and time. She appears well-developed and well-nourished. No distress.  Cardiovascular: Normal rate, regular rhythm, normal heart sounds and intact distal pulses. Exam reveals no gallop and no friction rub.  No murmur heard. Pulmonary/Chest: Effort normal and breath sounds normal. No respiratory distress. She has no wheezes. She has no rales. She exhibits no tenderness.  Musculoskeletal: Normal range of motion. She exhibits tenderness. She exhibits no edema or deformity.  No pain with straight leg raise, needed chest, internal or external rotation. She does have pain with palpation to right lower back and along the sciatic nerve. Denies any spinal tenderness  Neurological: She is alert and oriented to person, place, and time.  Skin: Skin is warm and dry. No rash noted. She is not diaphoretic. No erythema. No pallor.  No bruising noted  Psychiatric: She has a normal mood and affect. Her behavior is normal. Thought content normal.  Nursing note and vitals reviewed.       Assessment & Plan:  1. Sciatica of right side - Exam consistent with sciatica. We'll trial her on a Medrol Dosepak and Flexeril that she can take at night. Advised stretching exercises. She is to follow-up next week if her symptoms have not resolved. At that time we'll consider imaging - methylPREDNISolone (MEDROL DOSEPAK) 4 MG TBPK tablet; Take as directed  Dispense: 21 tablet; Refill: 0 - cyclobenzaprine (FLEXERIL) 5 MG tablet; Take 1 tablet (5 mg total) by mouth 3 (three) times daily as needed for muscle spasms.  Dispense: 30 tablet; Refill: 1  [name], NP

## 2018-01-11 ENCOUNTER — Telehealth: Payer: Self-pay | Admitting: Adult Health

## 2018-01-11 NOTE — Telephone Encounter (Signed)
Copied from [place]. Topic: Quick Communication - See Telephone Encounter >> Jan 11, 2018  2:59 PM Boyd Kerbs wrote: CRM for notification. See Telephone encounter for:   Pt. Called pharmacy told her that they could not fill Muscle Relaxer and needs to be PA by insurance..   The Pharmacy sent it back to office a couple weeks ago 2/4.  They have not heard anything and Audreyanna is wanting to know what to do.  She is hurting.   [facility] [address] [place], [place] [address] Phone: [phone] Fax: [phone]    01/11/18.

## 2018-01-11 NOTE — Telephone Encounter (Signed)
Pt called in to request a pain medication for her knee pain. Pt scheduled a ortho apt with Tamala Julian (with me) and asked if provider could send something in to her?    Pharmacy: [facility], [place]   Phone: [phone]

## 2018-01-11 NOTE — Telephone Encounter (Signed)
PA needed for Flexeril please.  I do not see any prior communication in chart from pharmacy requesting PA.

## 2018-01-13 ENCOUNTER — Ambulatory Visit (INDEPENDENT_AMBULATORY_CARE_PROVIDER_SITE_OTHER): Payer: Medicare HMO | Admitting: Family Medicine

## 2018-01-13 ENCOUNTER — Other Ambulatory Visit (INDEPENDENT_AMBULATORY_CARE_PROVIDER_SITE_OTHER): Payer: Medicare HMO

## 2018-01-13 ENCOUNTER — Encounter: Payer: Self-pay | Admitting: Family Medicine

## 2018-01-13 ENCOUNTER — Ambulatory Visit: Payer: Self-pay | Admitting: *Deleted

## 2018-01-13 VITALS — BP 122/68 | HR 70 | Temp 97.7°F | Ht 64.0 in | Wt 154.0 lb

## 2018-01-13 DIAGNOSIS — M5441 Lumbago with sciatica, right side: Secondary | ICD-10-CM

## 2018-01-13 DIAGNOSIS — G8929 Other chronic pain: Secondary | ICD-10-CM

## 2018-01-13 DIAGNOSIS — R350 Frequency of micturition: Secondary | ICD-10-CM | POA: Diagnosis not present

## 2018-01-13 LAB — URINALYSIS, ROUTINE W REFLEX MICROSCOPIC
Bilirubin Urine: NEGATIVE
HGB URINE DIPSTICK: NEGATIVE
KETONES UR: NEGATIVE
NITRITE: NEGATIVE
Specific Gravity, Urine: 1.015 (ref 1.000–1.030)
Total Protein, Urine: NEGATIVE
URINE GLUCOSE: NEGATIVE
Urobilinogen, UA: 0.2 (ref 0.0–1.0)
pH: 6.5 (ref 5.0–8.0)

## 2018-01-13 MED ORDER — NAPROXEN 500 MG PO TABS: 500.0000 mg | ORAL_TABLET | Freq: Two times a day (BID) | ORAL | 0 refills | Status: DC | PRN

## 2018-01-13 MED ORDER — GABAPENTIN 100 MG PO CAPS: 100.0000 mg | ORAL_CAPSULE | Freq: Three times a day (TID) | ORAL | 3 refills | Status: DC

## 2018-01-13 NOTE — Telephone Encounter (Signed)
  Reason for Disposition . Urinating more frequently than usual (i.e., frequency)  Answer Assessment - Initial Assessment Questions 1. SYMPTOM: "What's the main symptom you're concerned about?" (e.g., frequency, incontinence)     Frequency and back 2. ONSET: "When did the  ________  start?"     Started last week 3. PAIN: "Is there any pain?" If so, ask: "How bad is it?" (Scale: 1-10; mild, moderate, severe)     No 4. CAUSE: "What do you think is causing the symptoms?"     UTI 5. OTHER SYMPTOMS: "Do you have any other symptoms?" (e.g., fever, flank pain, blood in urine, pain with urination)     bACK PAIN 6. PREGNANCY: "Is there any chance you are pregnant?" "When was your last menstrual period?"     No  Protocols used: URINARY [facility]

## 2018-01-13 NOTE — Telephone Encounter (Signed)
Left VM for call back.

## 2018-01-13 NOTE — Progress Notes (Signed)
Cathy [name] - 72 y.o. female MRN [id number]  Date of birth: 11-10-1946  SUBJECTIVE:  Including CC & ROS.  Chief Complaint  Patient presents with  . Dysuria    Cathy [name] is a 72 y.o. female that is presenting with dysuria. Admits to frequent urination and back pain. Symptoms have been ongoing for one week. She has been taking Aleve for pain. She has a history of urinary tract infections.   Reviewed urine culture from 10/29/17 shows multiple organisms.  She reports having low back pain. This is acute on chronic in nature. Sometimes the pain has radiated down her right leg. The pain is moderate nature. She takes naproxen with some improvement of her pain. She has not had any injury or previous surgery. Certain bending movements and sitting for prolonged periods of time causes pain to be worse.   Review of Systems  Constitutional: Negative for fever.  Respiratory: Negative for cough.   Cardiovascular: Negative for chest pain.  Gastrointestinal: Negative for abdominal pain.  Genitourinary: Positive for dysuria.  Musculoskeletal: Positive for back pain.  Skin: Negative for color change.  Neurological: Negative for weakness.  Hematological: Negative for adenopathy.  Psychiatric/Behavioral: Negative for agitation.    HISTORY: Past Medical, Surgical, Social, and Family History Reviewed & Updated per EMR.   Pertinent Historical Findings include:  Past Medical History:  Diagnosis Date  . Chronic constipation   . Depression   . Diabetes mellitus type II   . DJD (degenerative joint disease)    knees  . Female cystocele   . GERD (gastroesophageal reflux disease)   . History of esophagitis   . History of MRSA infection    recurrent carbuncle  . History of recurrent UTIs   . Hyperlipidemia   . Hypertension   . Urgency of urination     Past Surgical History:  Procedure Laterality Date  . APPENDECTOMY  1980's  . BREAST EXCISIONAL BIOPSY Left 1989  . COLONOSCOPY  last  one 06-18-2015  . INGUINAL HERNIA REPAIR Left 05-10-2001  . VAGINAL HYSTERECTOMY  1980's  . VAGINAL PROLAPSE REPAIR N/A 03/02/2016   Procedure: COLOPLAST ANTERIOR  VAULT REPAIR WITH AXIS DERMIS. SACROSPINUS FIXATION, AUGMENTATION WITH AXIS DERMIS;  Surgeon: Carolan Clines, MD;  Location: [facility];  Service: Urology;  Laterality: N/A;    Allergies  Allergen Reactions  . Penicillins Hives    Has patient had a PCN reaction causing immediate rash, facial/tongue/throat swelling, SOB or lightheadedness with hypotension: Yes Has patient had a PCN reaction causing severe rash involving mucus membranes or skin necrosis: No Has patient had a PCN reaction that required hospitalization: No Has patient had a PCN reaction occurring within the last 10 years: No If all of the above answers are "NO", then may proceed with Cephalosporin use.     Family History  Problem Relation Age of Onset  . Aneurysm Mother 79       deceased secondary to brain aneurysm  . Liver cancer Father 84       deceased  . Diabetes Unknown        grandmother  . Colon cancer Neg Hx   . Stomach cancer Neg Hx   . Rectal cancer Neg Hx   . Esophageal cancer Neg Hx      Social History   Socioeconomic History  . Marital status: Single    Spouse name: Not on file  . Number of children: Not on file  . Years of education: Not on file  .  Highest education level: Not on file  Social Needs  . Financial resource strain: Not on file  . Food insecurity - worry: Not on file  . Food insecurity - inability: Not on file  . Transportation needs - medical: Not on file  . Transportation needs - non-medical: Not on file  Occupational History  . Not on file  Tobacco Use  . Smoking status: Former Smoker    Years: 1.00    Types: Cigarettes    Last attempt to quit: 02/28/1996    Years since quitting: 21.8  . Smokeless tobacco: Never Used  Substance and Sexual Activity  . Alcohol use: Yes    Alcohol/week: 0.0 oz     Comment: occasional  . Drug use: No  . Sexual activity: Not on file  Other Topics Concern  . Not on file  Social History Narrative   Single   Former Smoker  -  quit 10 to 11 years ago (light smoker)   Alcohol use-yes     2 children    Occupation: [employer]      PHYSICAL EXAM:  VS: BP 122/68 (BP Location: Left Arm, Patient Position: Sitting, Cuff Size: Normal)   Pulse 70   Temp 97.7 F (36.5 C) (Oral)   Ht 5\' 4"  (1.626 m)   Wt 154 lb (69.9 kg)   SpO2 97%   BMI 26.43 kg/m  Physical Exam Gen: NAD, alert, cooperative with exam, well-appearing ENT: normal lips, normal nasal mucosa,  Eye: normal EOM, normal conjunctiva and lids CV:  no edema, +2 pedal pulses   Resp: no accessory muscle use, non-labored,   Skin: no rashes, no areas of induration  Neuro: normal tone, normal sensation to touch Psych:  normal insight, alert and oriented MSK:  Back: Inspection: Unremarkable  Palpable tenderness: None. Range of Motion:  Flexion 45 deg; Extension 45 deg; Side Bending to 45 deg bilaterally; Rotation to 45 deg bilaterally  Leg strength: Quad: 5/5 Hamstring: 5/5 Hip flexor: 5/5 Hip   Strength at foot: Plantar-flexion: 5/5 Dorsi-flexion: 5/5 Eversion: 5/5 Inversion: 5/5  Sensory change: Gross sensation intact to all lumbar and sacral dermatomes.  Reflexes: 2+ at both patellar tendons, .  Gait unremarkable. SLR laying: Negative  XSLR laying: Negative  Neurovascularly intact.       ASSESSMENT & PLAN:   Chronic bilateral low back pain with right-sided sciatica The low back pain is likely muscular in nature. No weakness or numbness on exam today. No saddle anesthesia or urinary incontinence. Reports a component of sciatica down the right. - Gabapentin - Counseled on supportive care. - Counseled on home exercise therapy - If no improvement would consider imaging and physical therapy.  Urinary frequency She reports concerns for urinary tract infection.  Previous cultures have not grown anything sufficient - Urinalysis and urine culture

## 2018-01-13 NOTE — Patient Instructions (Signed)
Gabapentin can be used for back pain. Please start with 1 pill at night. You can titrate this up every few days as she tolerates. Until you are taking 3 pills per day. He do not have to take 3 pills per day to fill later symptoms are improved. Please follow-up with me for 6 weeks. Back pain is not improved.  We will call you with results of your urine from today.

## 2018-01-13 NOTE — Telephone Encounter (Signed)
Prior auth sent to [email or web address].

## 2018-01-14 ENCOUNTER — Telehealth: Payer: Self-pay | Admitting: Family Medicine

## 2018-01-14 DIAGNOSIS — M5441 Lumbago with sciatica, right side: Principal | ICD-10-CM

## 2018-01-14 DIAGNOSIS — G8929 Other chronic pain: Secondary | ICD-10-CM | POA: Insufficient documentation

## 2018-01-14 DIAGNOSIS — R350 Frequency of micturition: Secondary | ICD-10-CM | POA: Insufficient documentation

## 2018-01-14 MED ORDER — SULFAMETHOXAZOLE-TRIMETHOPRIM 800-160 MG PO TABS: 1.0000 | ORAL_TABLET | Freq: Two times a day (BID) | ORAL | 0 refills | Status: DC

## 2018-01-14 NOTE — Telephone Encounter (Signed)
Fax received from [facility] stating the RX was approved until 01/13/2020.  I called [facility] and informed Jacquelynn Cree of this.

## 2018-01-14 NOTE — Assessment & Plan Note (Signed)
The low back pain is likely muscular in nature. No weakness or numbness on exam today. No saddle anesthesia or urinary incontinence. Reports a component of sciatica down the right. - Gabapentin - Counseled on supportive care. - Counseled on home exercise therapy - If no improvement would consider imaging and physical therapy.

## 2018-01-14 NOTE — Assessment & Plan Note (Signed)
She reports concerns for urinary tract infection. Previous cultures have not grown anything sufficient - Urinalysis and urine culture

## 2018-01-14 NOTE — Telephone Encounter (Signed)
Left VM for patient. If she calls back please have her speak with a nurse/CMA and inform that I have sent an antibiotic to treat her possible urinary tract infection.  If any questions then please take the best time and phone number to call and I will try to call her back.   Rosemarie Ax, MD [facility] 01/14/2018, 12:55 PM

## 2018-01-16 LAB — URINE CULTURE
MICRO NUMBER:: 90230555
SPECIMEN QUALITY:: ADEQUATE

## 2018-01-18 ENCOUNTER — Telehealth: Payer: Self-pay | Admitting: Family Medicine

## 2018-01-18 NOTE — Telephone Encounter (Signed)
Left VM for patient. If she calls back please have her speak with a nurse/CMA and inform that her urine culture grew a bacteria but it wasn't a common bacteria that grows in urine. Please ask if she is still having symptoms.   If any questions then please take the best time and phone number to call and I will try to call her back.   Rosemarie Ax, MD [facility] 01/18/2018, 5:13 PM

## 2018-01-19 ENCOUNTER — Telehealth: Payer: Self-pay | Admitting: Family Medicine

## 2018-01-19 ENCOUNTER — Ambulatory Visit
Admission: RE | Admit: 2018-01-19 | Discharge: 2018-01-19 | Disposition: A | Discharge status: Home / Self Care | Payer: Medicare HMO | Source: Ambulatory Visit | Attending: Adult Health | Admitting: Adult Health

## 2018-01-19 DIAGNOSIS — Z1231 Encounter for screening mammogram for malignant neoplasm of breast: Secondary | ICD-10-CM | POA: Diagnosis not present

## 2018-01-19 DIAGNOSIS — R350 Frequency of micturition: Secondary | ICD-10-CM | POA: Diagnosis not present

## 2018-01-19 DIAGNOSIS — R3915 Urgency of urination: Secondary | ICD-10-CM | POA: Diagnosis not present

## 2018-01-19 DIAGNOSIS — Z139 Encounter for screening, unspecified: Secondary | ICD-10-CM

## 2018-01-19 NOTE — Telephone Encounter (Signed)
Copied from [place]. Topic: General - Other >> Jan 19, 2018 10:02 AM Ivar Drape wrote: Reason for CRM:  Patient has an appt today at 1:30pm today with another doctor but she would like a return call from [facility] before that appt.  She is upset about something and want to talk to [facility] about it, but she wouldn't say what it was about.

## 2018-01-19 NOTE — Telephone Encounter (Signed)
Pt  Returned  A  VM  Left  By  Clearance Coots  Pt informed  Of  Urine  Culture  Report    Pt  States  Is  Still  Having  Symptoms of  Frequent urination  And  Mild  Discomfort  When  She  Urinates -   Pt  Would  Like  To  Speak  With Dr Raeford Razor   Phone  Number  [phone]  Best time  To call  Around  5 pm

## 2018-01-19 NOTE — Telephone Encounter (Signed)
Called [facility] nurse and patient hung up before nurse could speak with her. [facility] nurse will try to call back

## 2018-01-20 ENCOUNTER — Telehealth: Payer: Self-pay | Admitting: Family Medicine

## 2018-01-20 MED ORDER — NITROFURANTOIN MONOHYD MACRO 100 MG PO CAPS: 100.0000 mg | ORAL_CAPSULE | Freq: Two times a day (BID) | ORAL | 0 refills | Status: DC

## 2018-01-20 NOTE — Telephone Encounter (Signed)
Patient calling again, very upset she has not received a call back. Requesting call asap. Call be [phone] or [phone]

## 2018-01-20 NOTE — Telephone Encounter (Signed)
Spoke with patient about her urine culture ongoing symptoms. We'll treat with Macrobid and reevaluate see if she is still symptomatic.  Rosemarie Ax, MD [facility] 01/20/2018, 12:58 PM

## 2018-01-21 NOTE — Telephone Encounter (Signed)
Spoke to the pt.  She did not understand the urine culture results given to her by Dr. Raeford Razor.  She thought that she had an incurable infection that was resistant to antibiotics.  Did not understand that she had an uncommon bacteria and will be cured by the [place].  Instructed pt to complete all of medication.  No further action needed.  Will now close encounter.

## 2018-01-23 NOTE — Progress Notes (Deleted)
[name] [place], [place] [address] Phone: [phone] Subjective:    I'm seeing this patient by the request  of:    CC:   Bilateral knee pain  SAY:[id number]  Cathy [name] is a 72 y.o. female coming in with complaint of bilateral knee pain.  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 His injection was 3 months ago.  Patient states     Past Medical History:  Diagnosis Date  . Chronic constipation   . Depression   . Diabetes mellitus type II   . DJD (degenerative joint disease)    knees  . Female cystocele   . GERD (gastroesophageal reflux disease)   . History of esophagitis   . History of MRSA infection    recurrent carbuncle  . History of recurrent UTIs   . Hyperlipidemia   . Hypertension   . Urgency of urination    Past Surgical History:  Procedure Laterality Date  . APPENDECTOMY  1980's  . BREAST EXCISIONAL BIOPSY Left 1989  . COLONOSCOPY  last one 06-18-2015  . INGUINAL HERNIA REPAIR Left 05-10-2001  . VAGINAL HYSTERECTOMY  1980's  . VAGINAL PROLAPSE REPAIR N/A 03/02/2016   Procedure: COLOPLAST ANTERIOR  VAULT REPAIR WITH AXIS DERMIS. SACROSPINUS FIXATION, AUGMENTATION WITH AXIS DERMIS;  Surgeon: Carolan Clines, MD;  Location: [facility];  Service: Urology;  Laterality: N/A;   Social History   Socioeconomic History  . Marital status: Single    Spouse name: Not on file  . Number of children: Not on file  . Years of education: Not on file  . Highest education level: Not on file  Social Needs  . Financial resource strain: Not on file  . Food insecurity - worry: Not on file  . Food insecurity - inability: Not on file  . Transportation needs - medical: Not on file  . Transportation needs - non-medical: Not on file  Occupational History  . Not on file  Tobacco Use  . Smoking status: Former Smoker    Years: 1.00    Types: Cigarettes    Last attempt to quit: 02/28/1996    Years  since quitting: 21.9  . Smokeless tobacco: Never Used  Substance and Sexual Activity  . Alcohol use: Yes    Alcohol/week: 0.0 oz    Comment: occasional  . Drug use: No  . Sexual activity: Not on file  Other Topics Concern  . Not on file  Social History Narrative   Single   Former Smoker  -  quit 10 to 11 years ago (light smoker)   Alcohol use-yes     2 children    Occupation: [employer]    Allergies  Allergen Reactions  . Penicillins Hives    Has patient had a PCN reaction causing immediate rash, facial/tongue/throat swelling, SOB or lightheadedness with hypotension: Yes Has patient had a PCN reaction causing severe rash involving mucus membranes or skin necrosis: No Has patient had a PCN reaction that required hospitalization: No Has patient had a PCN reaction occurring within the last 10 years: No If all of the above answers are "NO", then may proceed with Cephalosporin use.    Family History  Problem Relation Age of Onset  . Aneurysm Mother 27       deceased secondary to brain aneurysm  . Liver cancer Father 39       deceased  . Diabetes Unknown  grandmother  . Colon cancer Neg Hx   . Stomach cancer Neg Hx   . Rectal cancer Neg Hx   . Esophageal cancer Neg Hx      Past medical history, social, surgical and family history all reviewed in electronic medical record.  No pertanent information unless stated regarding to the chief complaint.   Review of Systems:Review of systems updated and as accurate as of 01/23/18  No headache, visual changes, nausea, vomiting, diarrhea, constipation, dizziness, abdominal pain, skin rash, fevers, chills, night sweats, weight loss, swollen lymph nodes, body aches, joint swelling, muscle aches, chest pain, shortness of breath, mood changes.   Objective  There were no vitals taken for this visit. Systems examined below as of 01/23/18   General: No apparent distress alert and oriented x3 mood and affect normal,  dressed appropriately.  HEENT: Pupils equal, extraocular movements intact  Respiratory: Patient's speak in full sentences and does not appear short of breath  Cardiovascular: No lower extremity edema, non tender, no erythema  Skin: Warm dry intact with no signs of infection or rash on extremities or on axial skeleton.  Abdomen: Soft nontender  Neuro: Cranial nerves II through XII are intact, neurovascularly intact in all extremities with 2+ DTRs and 2+ pulses.  Lymph: No lymphadenopathy of posterior or anterior cervical chain or axillae bilaterally.  Gait normal with good balance and coordination.  MSK:  Non tender with full range of motion and good stability and symmetric strength and tone of shoulders, elbows, wrist, hip and ankles bilaterally.  Knee: valgus deformity noted. Large thigh to calf ratio.  Tender to palpation over medial and PF joint line.  ROM full in flexion and extension and lower leg rotation. instability with valgus force.  painful patellar compression. Patellar glide with moderate crepitus. Patellar and quadriceps tendons unremarkable. Hamstring and quadriceps strength is normal. Contralateral knee shows   Impression and Recommendations:     This case required medical decision making of moderate complexity.      Note: This dictation was prepared with Dragon dictation along with smaller phrase technology. Any transcriptional errors that result from this process are unintentional.

## 2018-01-24 ENCOUNTER — Ambulatory Visit: Payer: Medicare HMO | Admitting: Family Medicine

## 2018-01-25 ENCOUNTER — Encounter: Payer: Self-pay | Admitting: Adult Health

## 2018-01-25 ENCOUNTER — Ambulatory Visit (INDEPENDENT_AMBULATORY_CARE_PROVIDER_SITE_OTHER): Payer: Medicare HMO | Admitting: Adult Health

## 2018-01-25 VITALS — BP 122/60 | Temp 98.0°F | Wt 153.0 lb

## 2018-01-25 DIAGNOSIS — N3000 Acute cystitis without hematuria: Secondary | ICD-10-CM | POA: Diagnosis not present

## 2018-01-25 DIAGNOSIS — E118 Type 2 diabetes mellitus with unspecified complications: Secondary | ICD-10-CM | POA: Diagnosis not present

## 2018-01-25 DIAGNOSIS — R300 Dysuria: Secondary | ICD-10-CM | POA: Diagnosis not present

## 2018-01-25 LAB — POCT URINALYSIS DIPSTICK
BILIRUBIN UA: NEGATIVE
Glucose, UA: NEGATIVE
KETONES UA: NEGATIVE
Leukocytes, UA: NEGATIVE
NITRITE UA: NEGATIVE
PH UA: 6 (ref 5.0–8.0)
PROTEIN UA: NEGATIVE
RBC UA: NEGATIVE
Spec Grav, UA: 1.02 (ref 1.010–1.025)
UROBILINOGEN UA: 0.2 U/dL

## 2018-01-25 LAB — POCT GLYCOSYLATED HEMOGLOBIN (HGB A1C): Hemoglobin A1C: 6.2

## 2018-01-25 NOTE — Progress Notes (Signed)
Subjective:    Patient ID: Cathy [name], female    DOB: 1946-06-08, 72 y.o.   MRN: [id number]  HPI  72 year old female who  has a past medical history of Chronic constipation, Depression, Diabetes mellitus type II, DJD (degenerative joint disease), Female cystocele, GERD (gastroesophageal reflux disease), History of esophagitis, History of MRSA infection, History of recurrent UTIs, Hyperlipidemia, Hypertension, and Urgency of urination.  She presents to the office to review her urinary culture. She was treated with macrobid by another PCP. She reports no symptoms. She wants to make sure the infection is cleared  She also reports varying blood sugars at home. She reports readings in the 80's- 130, and one reading of 300. She does not know what she ate prior to the high reading. She is not currently on any medication   Lab Results  Component Value Date   HGBA1C 6.4 09/24/2017     Review of Systems See HPI   Past Medical History:  Diagnosis Date  . Chronic constipation   . Depression   . Diabetes mellitus type II   . DJD (degenerative joint disease)    knees  . Female cystocele   . GERD (gastroesophageal reflux disease)   . History of esophagitis   . History of MRSA infection    recurrent carbuncle  . History of recurrent UTIs   . Hyperlipidemia   . Hypertension   . Urgency of urination     Social History   Socioeconomic History  . Marital status: Single    Spouse name: Not on file  . Number of children: Not on file  . Years of education: Not on file  . Highest education level: Not on file  Social Needs  . Financial resource strain: Not on file  . Food insecurity - worry: Not on file  . Food insecurity - inability: Not on file  . Transportation needs - medical: Not on file  . Transportation needs - non-medical: Not on file  Occupational History  . Not on file  Tobacco Use  . Smoking status: Former Smoker    Years: 1.00    Types: Cigarettes    Last  attempt to quit: 02/28/1996    Years since quitting: 21.9  . Smokeless tobacco: Never Used  Substance and Sexual Activity  . Alcohol use: Yes    Alcohol/week: 0.0 oz    Comment: occasional  . Drug use: No  . Sexual activity: Not on file  Other Topics Concern  . Not on file  Social History Narrative   Single   Former Smoker  -  quit 10 to 11 years ago (light smoker)   Alcohol use-yes     2 children    Occupation: [employer]     Past Surgical History:  Procedure Laterality Date  . APPENDECTOMY  1980's  . BREAST EXCISIONAL BIOPSY Left 1989  . COLONOSCOPY  last one 06-18-2015  . INGUINAL HERNIA REPAIR Left 05-10-2001  . VAGINAL HYSTERECTOMY  1980's  . VAGINAL PROLAPSE REPAIR N/A 03/02/2016   Procedure: COLOPLAST ANTERIOR  VAULT REPAIR WITH AXIS DERMIS. SACROSPINUS FIXATION, AUGMENTATION WITH AXIS DERMIS;  Surgeon: Carolan Clines, MD;  Location: [facility];  Service: Urology;  Laterality: N/A;    Family History  Problem Relation Age of Onset  . Aneurysm Mother 67       deceased secondary to brain aneurysm  . Liver cancer Father 85       deceased  .  Diabetes Unknown        grandmother  . Colon cancer Neg Hx   . Stomach cancer Neg Hx   . Rectal cancer Neg Hx   . Esophageal cancer Neg Hx     Allergies  Allergen Reactions  . Penicillins Hives    Has patient had a PCN reaction causing immediate rash, facial/tongue/throat swelling, SOB or lightheadedness with hypotension: Yes Has patient had a PCN reaction causing severe rash involving mucus membranes or skin necrosis: No Has patient had a PCN reaction that required hospitalization: No Has patient had a PCN reaction occurring within the last 10 years: No If all of the above answers are "NO", then may proceed with Cephalosporin use.     Current Outpatient Medications on File Prior to Visit  Medication Sig Dispense Refill  . aspirin 81 MG tablet Take 81 mg by mouth daily.      . Calcium  Carbonate-Vitamin D (CALTRATE 600+D) 600-400 MG-UNIT per tablet Take 1 tablet by mouth daily.      [name] CRANBERRY PO Take 2 tablets by mouth daily.     [name] gabapentin (NEURONTIN) 100 MG capsule Take 1 capsule (100 mg total) by mouth 3 (three) times daily. 90 capsule 3  . lisinopril (PRINIVIL,ZESTRIL) 10 MG tablet Take 1 tablet (10 mg total) by mouth daily. 90 tablet 1  . loratadine (CLARITIN) 10 MG tablet Take 10 mg by mouth daily.    . Misc Natural Products (TART CHERRY ADVANCED) CAPS Take 1 capsule by mouth daily.    . naproxen (NAPROSYN) 500 MG tablet Take 1 tablet (500 mg total) by mouth 2 (two) times daily as needed. Only as needed for pain. 30 tablet 0  . nitrofurantoin, macrocrystal-monohydrate, (MACROBID) 100 MG capsule Take 1 capsule (100 mg total) by mouth 2 (two) times daily. For 5 days 10 capsule 0  . ONETOUCH DELICA LANCETS FINE MISC USE TO CHECK BLOOD SUGAR ONCE DAILY 100 each 3  . ONETOUCH VERIO test strip TEST once daily 100 each 1  . polyethylene glycol (MIRALAX / GLYCOLAX) packet Take 17 g by mouth daily as needed for mild constipation.    . simvastatin (ZOCOR) 40 MG tablet take 1 tablet by mouth once daily AT 6 PM 90 tablet 3  . sulfamethoxazole-trimethoprim (BACTRIM DS,SEPTRA DS) 800-160 MG tablet Take 1 tablet by mouth 2 (two) times daily. For 3 days 6 tablet 0  . Tetrahydrozoline HCl (VISINE OP) Apply 1 drop to eye daily as needed (RED EYE).    [name] tolterodine (DETROL LA) 2 MG 24 hr capsule Take 1 capsule (2 mg total) by mouth daily. 30 capsule 2  . TURMERIC PO Take 1 tablet by mouth daily.     . [DISCONTINUED] valsartan (DIOVAN) 160 MG tablet Take 1/2 tablet by mouth once daily 30 tablet 5   No current facility-administered medications on file prior to visit.     BP 122/60   Temp 98 F (36.7 C)   Wt 153 lb (69.4 kg)   BMI 26.26 kg/m       Objective:   Physical Exam  Constitutional: She is oriented to person, place, and time. She appears well-developed and  well-nourished. No distress.  Cardiovascular: Normal rate, regular rhythm, normal heart sounds and intact distal pulses. Exam reveals no gallop and no friction rub.  No murmur heard. Pulmonary/Chest: Effort normal and breath sounds normal. No respiratory distress. She has no wheezes. She has no rales. She exhibits no tenderness.  Abdominal: Soft. Bowel sounds  are normal. She exhibits no distension and no mass. There is no tenderness. There is no rebound and no guarding.  Neurological: She is alert and oriented to person, place, and time.  Skin: Skin is warm and dry.  Psychiatric: She has a normal mood and affect. Her behavior is normal. Judgment and thought content normal.  Vitals reviewed.     Assessment & Plan:  1. Acute cystitis without hematuria - UA negative  - POC Urinalysis Dipstick  2. Controlled type 2 diabetes mellitus with complication, without long-term current use of insulin (HCC)  - POC HgB A1c- 6.2. Has improved  [name], NP

## 2018-02-02 DIAGNOSIS — R351 Nocturia: Secondary | ICD-10-CM | POA: Diagnosis not present

## 2018-02-02 DIAGNOSIS — M6281 Muscle weakness (generalized): Secondary | ICD-10-CM | POA: Diagnosis not present

## 2018-02-02 DIAGNOSIS — R350 Frequency of micturition: Secondary | ICD-10-CM | POA: Diagnosis not present

## 2018-02-02 DIAGNOSIS — M6289 Other specified disorders of muscle: Secondary | ICD-10-CM | POA: Diagnosis not present

## 2018-02-02 DIAGNOSIS — R3914 Feeling of incomplete bladder emptying: Secondary | ICD-10-CM | POA: Diagnosis not present

## 2018-02-02 DIAGNOSIS — R3915 Urgency of urination: Secondary | ICD-10-CM | POA: Diagnosis not present

## 2018-02-14 ENCOUNTER — Other Ambulatory Visit: Payer: Self-pay | Admitting: Adult Health

## 2018-02-14 ENCOUNTER — Telehealth: Payer: Self-pay | Admitting: Adult Health

## 2018-02-14 NOTE — Telephone Encounter (Signed)
Pt called to f/u on RX. She said she has been out since last week. She said she was advised by [facility] that "Dr. Araceli Bouche" cancelled this med. Pt states she's never seen a doctor with that name.   Pt only has 1 pill of simvastatin left also.  Please advise. If no answer please leave a detailed msg on her cell phone # [phone].  [facility] [phone] - [place], [address] AT [facility] [address] [place] [phone] (Phone) [phone] (Fax)

## 2018-02-14 NOTE — Telephone Encounter (Deleted)
Copied from [place] [id number]. Topic: Quick Communication - Rx Refill/Question >> Feb 14, 2018  3:51 PM Synthia Innocent wrote: Medication: lisinopril (PRINIVIL,ZESTRIL) 10 MG tablet  Has the patient contacted their pharmacy? Yes.   (Agent: If no, request that the patient contact the pharmacy for the refill.) Preferred Pharmacy (with phone number or street name): [facility] on [address] Agent: Please be advised that RX refills may take up to 3 business days. We ask that you follow-up with your pharmacy.

## 2018-02-14 NOTE — Telephone Encounter (Signed)
Copied from [place] [id number]. Topic: Quick Communication - Rx Refill/Question >> Feb 14, 2018  3:51 PM Synthia Innocent wrote: Medication: lisinopril (PRINIVIL,ZESTRIL) 10 MG tablet  Has the patient contacted their pharmacy? Yes.   (Agent: If no, request that the patient contact the pharmacy for the refill.) Preferred Pharmacy (with phone number or street name): [facility] [address] Agent: Please be advised that RX refills may take up to 3 business days. We ask that you follow-up with your pharmacy.

## 2018-02-15 NOTE — Telephone Encounter (Signed)
Sent to the pharmacy by e-scribe. 

## 2018-02-15 NOTE — Progress Notes (Signed)
[name] [place], [place] [address] Phone: [phone] Subjective:     CC: Bilateral knee pain  IEP:[id number]  Cathy [name] is a 72 y.o. female coming in with complaint of bilateral knee pain, left greater than right. She also has left achilles pain for the past month.  Patient states having increasing pain and instability of the knees.  Known to have severe arthritic changes.  Patient states that she has noticed that it started to make it more difficult to do daily activities.  Possible associated swelling.      Past Medical History:  Diagnosis Date  . Chronic constipation   . Depression   . Diabetes mellitus type II   . DJD (degenerative joint disease)    knees  . Female cystocele   . GERD (gastroesophageal reflux disease)   . History of esophagitis   . History of MRSA infection    recurrent carbuncle  . History of recurrent UTIs   . Hyperlipidemia   . Hypertension   . Urgency of urination    Past Surgical History:  Procedure Laterality Date  . APPENDECTOMY  1980's  . BREAST EXCISIONAL BIOPSY Left 1989  . COLONOSCOPY  last one 06-18-2015  . INGUINAL HERNIA REPAIR Left 05-10-2001  . VAGINAL HYSTERECTOMY  1980's  . VAGINAL PROLAPSE REPAIR N/A 03/02/2016   Procedure: COLOPLAST ANTERIOR  VAULT REPAIR WITH AXIS DERMIS. SACROSPINUS FIXATION, AUGMENTATION WITH AXIS DERMIS;  Surgeon: Carolan Clines, MD;  Location: [facility];  Service: Urology;  Laterality: N/A;   Social History   Socioeconomic History  . Marital status: Single    Spouse name: Not on file  . Number of children: Not on file  . Years of education: Not on file  . Highest education level: Not on file  Occupational History  . Not on file  Social Needs  . Financial resource strain: Not on file  . Food insecurity:    Worry: Not on file    Inability: Not on file  . Transportation needs:    Medical: Not on file    Non-medical: Not on  file  Tobacco Use  . Smoking status: Former Smoker    Years: 1.00    Types: Cigarettes    Last attempt to quit: 02/28/1996    Years since quitting: 21.9  . Smokeless tobacco: Never Used  Substance and Sexual Activity  . Alcohol use: Yes    Alcohol/week: 0.0 oz    Comment: occasional  . Drug use: No  . Sexual activity: Not on file  Lifestyle  . Physical activity:    Days per week: Not on file    Minutes per session: Not on file  . Stress: Not on file  Relationships  . Social connections:    Talks on phone: Not on file    Gets together: Not on file    Attends religious service: Not on file    Active member of club or organization: Not on file    Attends meetings of clubs or organizations: Not on file    Relationship status: Not on file  Other Topics Concern  . Not on file  Social History Narrative   Single   Former Smoker  -  quit 10 to 11 years ago (light smoker)   Alcohol use-yes     2 children    Occupation: [employer]    Allergies  Allergen Reactions  . Penicillins Hives  Has patient had a PCN reaction causing immediate rash, facial/tongue/throat swelling, SOB or lightheadedness with hypotension: Yes Has patient had a PCN reaction causing severe rash involving mucus membranes or skin necrosis: No Has patient had a PCN reaction that required hospitalization: No Has patient had a PCN reaction occurring within the last 10 years: No If all of the above answers are "NO", then may proceed with Cephalosporin use.    Family History  Problem Relation Age of Onset  . Aneurysm Mother 71       deceased secondary to brain aneurysm  . Liver cancer Father 31       deceased  . Diabetes Unknown        grandmother  . Colon cancer Neg Hx   . Stomach cancer Neg Hx   . Rectal cancer Neg Hx   . Esophageal cancer Neg Hx      Past medical history, social, surgical and family history all reviewed in electronic medical record.  No pertanent information unless  stated regarding to the chief complaint.   Review of Systems:Review of systems updated and as accurate as of 02/16/18  No headache, visual changes, nausea, vomiting, diarrhea, constipation, dizziness, abdominal pain, skin rash, fevers, chills, night sweats, weight loss, swollen lymph nodes, body aches,chest pain, shortness of breath, mood changes.  Positive joint swelling and muscle aches  Objective  Blood pressure (!) 160/88, pulse 68, height 5\' 4"  (1.626 m), weight 153 lb (69.4 kg), SpO2 98 %. Systems examined below as of 02/16/18   General: No apparent distress alert and oriented x3 mood and affect normal, dressed appropriately.  HEENT: Pupils equal, extraocular movements intact  Respiratory: Patient's speak in full sentences and does not appear short of breath  Cardiovascular: No lower extremity edema, non tender, no erythema  Skin: Warm dry intact with no signs of infection or rash on extremities or on axial skeleton.  Abdomen: Soft nontender  Neuro: Cranial nerves II through XII are intact, neurovascularly intact in all extremities with 2+ DTRs and 2+ pulses.  Lymph: No lymphadenopathy of posterior or anterior cervical chain or axillae bilaterally.  Gait mild antalgic gait MSK:  Non tender with full range of motion and good stability and symmetric strength and tone of shoulders, elbows, wrist, hip, and ankles bilaterally.   Knee: Bilateral valgus deformity noted.  Abnormal thigh to calf ratio.  Tender to palpation over medial and PF joint line.  ROM full in flexion and extension and lower leg rotation. instability with valgus force.  painful patellar compression. Patellar glide with moderate crepitus. Patellar and quadriceps tendons unremarkable. Hamstring and quadriceps strength is normal.  After informed written and verbal consent, patient was seated on exam table. Right knee was prepped with alcohol swab and utilizing anterolateral approach, patient's right knee space was  injected with 4:1  marcaine 0.5%: Kenalog 40mg /dL. Patient tolerated the procedure well without immediate complications.  After informed written and verbal consent, patient was seated on exam table. Left knee was prepped with alcohol swab and utilizing anterolateral approach, patient's left knee space was injected with 4:1  marcaine 0.5%: Kenalog 40mg /dL. Patient tolerated the procedure well without immediate complications.    Impression and Recommendations:     This case required medical decision making of moderate complexity.      Note: This dictation was prepared with Dragon dictation along with smaller phrase technology. Any transcriptional errors that result from this process are unintentional.

## 2018-02-16 ENCOUNTER — Telehealth: Payer: Self-pay | Admitting: Adult Health

## 2018-02-16 ENCOUNTER — Ambulatory Visit: Payer: Self-pay | Admitting: *Deleted

## 2018-02-16 ENCOUNTER — Ambulatory Visit: Payer: Medicare HMO | Admitting: Family Medicine

## 2018-02-16 ENCOUNTER — Encounter: Payer: Self-pay | Admitting: Family Medicine

## 2018-02-16 DIAGNOSIS — M170 Bilateral primary osteoarthritis of knee: Secondary | ICD-10-CM | POA: Diagnosis not present

## 2018-02-16 MED ORDER — LISINOPRIL 10 MG PO TABS: 10.0000 mg | ORAL_TABLET | Freq: Every day | ORAL | 1 refills | Status: DC

## 2018-02-16 NOTE — Telephone Encounter (Signed)
Called pharmacy and confirmed that medication was NOT received by the pharmacy.  Re sent by e-scribe.

## 2018-02-16 NOTE — Telephone Encounter (Signed)
Copied from [place]. Topic: Quick Communication - Rx Refill/Question >> Feb 16, 2018 10:36 AM Cecelia Byars, NT wrote: Medication:  lisinopril (PRINIVIL,ZESTRIL) 10 MG tablet  Has the patient contacted their pharmacy? {yes (Agent: If no, request that the patient contact the pharmacy for the refill.) Preferred Pharmacy (with phone number or street name): [facility] [phone] - [place], [address] AT [facility] [address] [place] [phone] (Phone) [phone] (Fax Agent: Please be advised that RX refills may take up to 3 business days. We ask that you follow-up with your pharmacy. The pharmacy says they have not received prescription that was sent e scribed please resend , thanks

## 2018-02-16 NOTE — Assessment & Plan Note (Signed)
Patient given injections today.  Tolerated the procedure well.  We discussed icing regimen and home exercises.  Discussed which activity to doing which wants to avoid.  Topical anti-inflammatories given.  Follow-up again 4 weeks worsening pain consider Visco supplementation.

## 2018-02-16 NOTE — Telephone Encounter (Signed)
Called patient to see what her b/p is right now. She does not have a b/p monitor at home and had to go to a fire station to have it check. It was 180/90. She denied having blurred vision, headache, weakness on one side of the body or nausea.  She states that she will go to a drug store and check her b/p. Advised to buy one and have it at home. Per protocol, appointment made for in the morning with her provider. Pt voiced understanding. Home care advice given to her with verbal understanding.  Reason for Disposition . Systolic BP  >= 425 OR Diastolic >= 956  Answer Assessment - Initial Assessment Questions 1. BLOOD PRESSURE: "What is the blood pressure?" "Did you take at least two measurements 5 minutes apart?"     180/90 at the fire station 2. ONSET: "When did you take your blood pressure?"     About 2 hours ago 3. HOW: "How did you obtain the blood pressure?" (e.g., visiting nurse, automatic home BP monitor)     Was checked using a stethoscope  4. HISTORY: "Do you have a history of high blood pressure?"     yes 5. MEDICATIONS: "Are you taking any medications for blood pressure?" "Have you missed any doses recently?"     Yes b/p but have missed 6 days worth of medications 6. OTHER SYMPTOMS: "Do you have any symptoms?" (e.g., headache, chest pain, blurred vision, difficulty breathing, weakness)     no 7. PREGNANCY: "Is there any chance you are pregnant?" "When was your last menstrual period?"     no  Protocols used: HIGH BLOOD PRESSURE-A-AH

## 2018-02-16 NOTE — Patient Instructions (Signed)
Good to see you  Ice is your friend Heel lift bilaterally.  pennsaid pinkie amount topically 2 times daily as needed.  Injected both knees  Will see if we can get monovisc approved.  See me again in 4 weeks if you wan tot try the other injections otherwise see me when you need me

## 2018-02-16 NOTE — Telephone Encounter (Signed)
Pt called c/o that her b/p had been going up because she has not been able to get her meds from the pharmacy. She states her medication had been canceled by another doctor.  Checking in her chart, her lisinopril was sent to her pharmacy yesterday afternoon.  Pt advised of this and she voiced understanding. She stated that she was on her way to her knee doctor. But she stated her b/p this morning was 198/92, 158/92, 158/100.  She was pretty upset this morning about her medication. Will call her back and see how her b/p is this morning.  Advised her to get her medication and calm down. Not able to triage her at this time.

## 2018-02-16 NOTE — Telephone Encounter (Signed)
Called patient to see if she had her medication. She stated that it was not there yet but she was given 1 lisinopril to take now. Advised her to take it and call the office back with her b/p reading.  Pt voiced understanding.

## 2018-02-17 ENCOUNTER — Encounter: Payer: Self-pay | Admitting: Adult Health

## 2018-02-17 ENCOUNTER — Ambulatory Visit (INDEPENDENT_AMBULATORY_CARE_PROVIDER_SITE_OTHER): Payer: Medicare HMO | Admitting: Adult Health

## 2018-02-17 VITALS — BP 128/66 | Temp 98.0°F | Wt 152.0 lb

## 2018-02-17 DIAGNOSIS — I10 Essential (primary) hypertension: Secondary | ICD-10-CM

## 2018-02-17 DIAGNOSIS — R350 Frequency of micturition: Secondary | ICD-10-CM

## 2018-02-17 LAB — POC URINALSYSI DIPSTICK (AUTOMATED)
Bilirubin, UA: NEGATIVE
Blood, UA: NEGATIVE
GLUCOSE UA: NEGATIVE
KETONES UA: NEGATIVE
LEUKOCYTES UA: NEGATIVE
NITRITE UA: NEGATIVE
PH UA: 6 (ref 5.0–8.0)
Protein, UA: NEGATIVE
Spec Grav, UA: <=1.005 — AB (ref 1.010–1.025)
Urobilinogen, UA: 0.2 E.U./dL

## 2018-02-17 NOTE — Progress Notes (Signed)
Subjective:    Patient ID: Cathy [name], female    DOB: 1945/12/10, 72 y.o.   MRN: [id number]  HPI 72 year old pleasant female who  has a past medical history of Chronic constipation, Depression, Diabetes mellitus type II, DJD (degenerative joint disease), Female cystocele, GERD (gastroesophageal reflux disease), History of esophagitis, History of MRSA infection, History of recurrent UTIs, Hyperlipidemia, Hypertension, and Urgency of urination.   She presents to the office today for follow-up regarding hypertension.  She reports that she has been out of her blood pressure medication for approximately 6 days.  She was able to secure a single dose of lisinopril from the pharmacy while she was waiting for her medications to be called in.  Medication was called in approximately 3 days ago after getting a refill request but apparently the pharmacy never received that refill request.  Medication was sent in again yesterday afternoon.  She reports blood pressure readings as high as 198/82.  She has since picked up her medication   She would also like to have her urine checked for possible UTI. She reports that she has had urinary frequency ( chronic condition)   Review of Systems See HPI   Past Medical History:  Diagnosis Date  . Chronic constipation   . Depression   . Diabetes mellitus type II   . DJD (degenerative joint disease)    knees  . Female cystocele   . GERD (gastroesophageal reflux disease)   . History of esophagitis   . History of MRSA infection    recurrent carbuncle  . History of recurrent UTIs   . Hyperlipidemia   . Hypertension   . Urgency of urination     Social History   Socioeconomic History  . Marital status: Single    Spouse name: Not on file  . Number of children: Not on file  . Years of education: Not on file  . Highest education level: Not on file  Occupational History  . Not on file  Social Needs  . Financial resource strain: Not on file  . Food  insecurity:    Worry: Not on file    Inability: Not on file  . Transportation needs:    Medical: Not on file    Non-medical: Not on file  Tobacco Use  . Smoking status: Former Smoker    Years: 1.00    Types: Cigarettes    Last attempt to quit: 02/28/1996    Years since quitting: 21.9  . Smokeless tobacco: Never Used  Substance and Sexual Activity  . Alcohol use: Yes    Alcohol/week: 0.0 oz    Comment: occasional  . Drug use: No  . Sexual activity: Not on file  Lifestyle  . Physical activity:    Days per week: Not on file    Minutes per session: Not on file  . Stress: Not on file  Relationships  . Social connections:    Talks on phone: Not on file    Gets together: Not on file    Attends religious service: Not on file    Active member of club or organization: Not on file    Attends meetings of clubs or organizations: Not on file    Relationship status: Not on file  . Intimate partner violence:    Fear of current or ex partner: Not on file    Emotionally abused: Not on file    Physically abused: Not on file    Forced sexual activity: Not on file  Other Topics Concern  . Not on file  Social History Narrative   Single   Former Smoker  -  quit 10 to 11 years ago (light smoker)   Alcohol use-yes     2 children    Occupation: [employer]     Past Surgical History:  Procedure Laterality Date  . APPENDECTOMY  1980's  . BREAST EXCISIONAL BIOPSY Left 1989  . COLONOSCOPY  last one 06-18-2015  . INGUINAL HERNIA REPAIR Left 05-10-2001  . VAGINAL HYSTERECTOMY  1980's  . VAGINAL PROLAPSE REPAIR N/A 03/02/2016   Procedure: COLOPLAST ANTERIOR  VAULT REPAIR WITH AXIS DERMIS. SACROSPINUS FIXATION, AUGMENTATION WITH AXIS DERMIS;  Surgeon: Carolan Clines, MD;  Location: [facility];  Service: Urology;  Laterality: N/A;    Family History  Problem Relation Age of Onset  . Aneurysm Mother 65       deceased secondary to brain aneurysm  . Liver cancer  Father 6       deceased  . Diabetes Unknown        grandmother  . Colon cancer Neg Hx   . Stomach cancer Neg Hx   . Rectal cancer Neg Hx   . Esophageal cancer Neg Hx     Allergies  Allergen Reactions  . Penicillins Hives    Has patient had a PCN reaction causing immediate rash, facial/tongue/throat swelling, SOB or lightheadedness with hypotension: Yes Has patient had a PCN reaction causing severe rash involving mucus membranes or skin necrosis: No Has patient had a PCN reaction that required hospitalization: No Has patient had a PCN reaction occurring within the last 10 years: No If all of the above answers are "NO", then may proceed with Cephalosporin use.     Current Outpatient Medications on File Prior to Visit  Medication Sig Dispense Refill  . aspirin 81 MG tablet Take 81 mg by mouth daily.      . Calcium Carbonate-Vitamin D (CALTRATE 600+D) 600-400 MG-UNIT per tablet Take 1 tablet by mouth daily.      [name] CRANBERRY PO Take 2 tablets by mouth daily.     [name] gabapentin (NEURONTIN) 100 MG capsule Take 1 capsule (100 mg total) by mouth 3 (three) times daily. 90 capsule 3  . lisinopril (PRINIVIL,ZESTRIL) 10 MG tablet Take 1 tablet (10 mg total) by mouth daily. 90 tablet 1  . loratadine (CLARITIN) 10 MG tablet Take 10 mg by mouth daily.    . Misc Natural Products (TART CHERRY ADVANCED) CAPS Take 1 capsule by mouth daily.    . naproxen (NAPROSYN) 500 MG tablet Take 1 tablet (500 mg total) by mouth 2 (two) times daily as needed. Only as needed for pain. 30 tablet 0  . ONETOUCH DELICA LANCETS FINE MISC USE TO CHECK BLOOD SUGAR ONCE DAILY 100 each 3  . ONETOUCH VERIO test strip TEST once daily 100 each 1  . polyethylene glycol (MIRALAX / GLYCOLAX) packet Take 17 g by mouth daily as needed for mild constipation.    . simvastatin (ZOCOR) 40 MG tablet take 1 tablet by mouth once daily AT 6 PM 90 tablet 3  . Tetrahydrozoline HCl (VISINE OP) Apply 1 drop to eye daily as needed (RED EYE).      . TURMERIC PO Take 1 tablet by mouth daily.     . cyclobenzaprine (FLEXERIL) 5 MG tablet take 1 tablet by mouth three times a day if needed for muscle spasm  0  . tolterodine (DETROL LA) 2 MG  24 hr capsule Take 1 capsule (2 mg total) by mouth daily. (Patient not taking: Reported on 02/17/2018) 30 capsule 2  . [DISCONTINUED] valsartan (DIOVAN) 160 MG tablet Take 1/2 tablet by mouth once daily 30 tablet 5   No current facility-administered medications on file prior to visit.     BP 128/66 (BP Location: Left Arm)   Temp 98 F (36.7 C) (Oral)   Wt 152 lb (68.9 kg)   BMI 26.09 kg/m       Objective:   Physical Exam  Constitutional: She is oriented to person, place, and time. She appears well-developed and well-nourished. No distress.  HENT:  Head: Normocephalic and atraumatic.  Right Ear: External ear normal.  Left Ear: External ear normal.  Nose: Nose normal.  Mouth/Throat: Oropharynx is clear and moist. No oropharyngeal exudate.  Eyes: Pupils are equal, round, and reactive to light. Conjunctivae and EOM are normal. Right eye exhibits no discharge. Left eye exhibits no discharge. No scleral icterus.  Neck: Normal range of motion. Neck supple. No JVD present. No tracheal deviation present. No thyromegaly present.  Cardiovascular: Normal rate, regular rhythm, normal heart sounds and intact distal pulses. Exam reveals no gallop and no friction rub.  No murmur heard. Pulmonary/Chest: Effort normal and breath sounds normal. No stridor. No respiratory distress. She has no wheezes. She has no rales. She exhibits no tenderness.  Abdominal: Soft. Bowel sounds are normal. She exhibits no distension and no mass. There is no tenderness. There is no rebound and no guarding.  Musculoskeletal: Normal range of motion. She exhibits no edema, tenderness or deformity.  Lymphadenopathy:    She has no cervical adenopathy.  Neurological: She is alert and oriented to person, place, and time. She has normal  reflexes. She displays normal reflexes. No cranial nerve deficit. She exhibits normal muscle tone. Coordination normal.  Skin: Skin is warm and dry. No rash noted. She is not diaphoretic. No erythema. No pallor.  Psychiatric: She has a normal mood and affect. Her behavior is normal. Judgment and thought content normal.  Nursing note and vitals reviewed.     Assessment & Plan:  1. Essential hypertension - BP back to normal  - No dose change - Monitor BP at home   2. Frequency of urination - POCT Urinalysis Dipstick (Automated)- clean  - Advised to take Detrol LA as directed  [name], NP

## 2018-03-09 DIAGNOSIS — K5900 Constipation, unspecified: Secondary | ICD-10-CM | POA: Diagnosis not present

## 2018-03-09 DIAGNOSIS — M62838 Other muscle spasm: Secondary | ICD-10-CM | POA: Diagnosis not present

## 2018-03-09 DIAGNOSIS — M6289 Other specified disorders of muscle: Secondary | ICD-10-CM | POA: Diagnosis not present

## 2018-03-09 DIAGNOSIS — M6281 Muscle weakness (generalized): Secondary | ICD-10-CM | POA: Diagnosis not present

## 2018-03-17 ENCOUNTER — Ambulatory Visit: Payer: Medicare HMO | Admitting: Family Medicine

## 2018-04-13 DIAGNOSIS — R350 Frequency of micturition: Secondary | ICD-10-CM | POA: Diagnosis not present

## 2018-04-13 DIAGNOSIS — R3914 Feeling of incomplete bladder emptying: Secondary | ICD-10-CM | POA: Diagnosis not present

## 2018-04-13 DIAGNOSIS — M62838 Other muscle spasm: Secondary | ICD-10-CM | POA: Diagnosis not present

## 2018-04-13 DIAGNOSIS — K5900 Constipation, unspecified: Secondary | ICD-10-CM | POA: Diagnosis not present

## 2018-04-13 DIAGNOSIS — M6281 Muscle weakness (generalized): Secondary | ICD-10-CM | POA: Diagnosis not present

## 2018-04-13 DIAGNOSIS — M6289 Other specified disorders of muscle: Secondary | ICD-10-CM | POA: Diagnosis not present

## 2018-04-13 NOTE — Progress Notes (Signed)
[name] [address], [place] [address] Phone: [phone] Subjective:     CC: Bilateral knee pain  IOE:[id number]  Cathy [name] is a 72 y.o. female coming in with complaint of bilateral knee pain.  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  Last injection 9 weeks ago.  Started having increasing swelling.  Patient is now decreasing her work and is hoping that this will get better.  States that there is some instability.  Can wake her up at night and affecting daily activities     Past Medical History:  Diagnosis Date  . Chronic constipation   . Depression   . Diabetes mellitus type II   . DJD (degenerative joint disease)    knees  . Female cystocele   . GERD (gastroesophageal reflux disease)   . History of esophagitis   . History of MRSA infection    recurrent carbuncle  . History of recurrent UTIs   . Hyperlipidemia   . Hypertension   . Urgency of urination    Past Surgical History:  Procedure Laterality Date  . APPENDECTOMY  1980's  . BREAST EXCISIONAL BIOPSY Left 1989  . COLONOSCOPY  last one 06-18-2015  . INGUINAL HERNIA REPAIR Left 05-10-2001  . VAGINAL HYSTERECTOMY  1980's  . VAGINAL PROLAPSE REPAIR N/A 03/02/2016   Procedure: COLOPLAST ANTERIOR  VAULT REPAIR WITH AXIS DERMIS. SACROSPINUS FIXATION, AUGMENTATION WITH AXIS DERMIS;  Surgeon: Carolan Clines, MD;  Location: [facility];  Service: Urology;  Laterality: N/A;   Social History   Socioeconomic History  . Marital status: Single    Spouse name: Not on file  . Number of children: Not on file  . Years of education: Not on file  . Highest education level: Not on file  Occupational History  . Not on file  Social Needs  . Financial resource strain: Not on file  . Food insecurity:    Worry: Not on file    Inability: Not on file  . Transportation needs:    Medical: Not on file    Non-medical: Not on file  Tobacco Use  . Smoking  status: Former Smoker    Years: 1.00    Types: Cigarettes    Last attempt to quit: 02/28/1996    Years since quitting: 22.1  . Smokeless tobacco: Never Used  Substance and Sexual Activity  . Alcohol use: Yes    Alcohol/week: 0.0 oz    Comment: occasional  . Drug use: No  . Sexual activity: Not on file  Lifestyle  . Physical activity:    Days per week: Not on file    Minutes per session: Not on file  . Stress: Not on file  Relationships  . Social connections:    Talks on phone: Not on file    Gets together: Not on file    Attends religious service: Not on file    Active member of club or organization: Not on file    Attends meetings of clubs or organizations: Not on file    Relationship status: Not on file  Other Topics Concern  . Not on file  Social History Narrative   Single   Former Smoker  -  quit 10 to 11 years ago (light smoker)   Alcohol use-yes     2 children    Occupation: [employer]    Allergies  Allergen Reactions  . Penicillins Hives    Has patient had  a PCN reaction causing immediate rash, facial/tongue/throat swelling, SOB or lightheadedness with hypotension: Yes Has patient had a PCN reaction causing severe rash involving mucus membranes or skin necrosis: No Has patient had a PCN reaction that required hospitalization: No Has patient had a PCN reaction occurring within the last 10 years: No If all of the above answers are "NO", then may proceed with Cephalosporin use.    Family History  Problem Relation Age of Onset  . Aneurysm Mother 71       deceased secondary to brain aneurysm  . Liver cancer Father 108       deceased  . Diabetes Unknown        grandmother  . Colon cancer Neg Hx   . Stomach cancer Neg Hx   . Rectal cancer Neg Hx   . Esophageal cancer Neg Hx      Past medical history, social, surgical and family history all reviewed in electronic medical record.  No pertanent information unless stated regarding to the chief  complaint.   Review of Systems:Review of systems updated and as accurate as of 04/14/18  No headache, visual changes, nausea, vomiting, diarrhea, constipation, dizziness, abdominal pain, skin rash, fevers, chills, night sweats, weight loss, swollen lymph nodes, body aches,  chest pain, shortness of breath, mood changes.  Positive muscle aches, joint swelling  Objective  Blood pressure 140/70, pulse 68, height 5\' 4"  (1.626 m), weight 148 lb (67.1 kg), SpO2 97 %. Systems examined below as of 04/14/18   General: No apparent distress alert and oriented x3 mood and affect normal, dressed appropriately.  HEENT: Pupils equal, extraocular movements intact  Respiratory: Patient's speak in full sentences and does not appear short of breath  Cardiovascular: No lower extremity edema, non tender, no erythema  Skin: Warm dry intact with no signs of infection or rash on extremities or on axial skeleton.  Abdomen: Soft nontender  Neuro: Cranial nerves II through XII are intact, neurovascularly intact in all extremities with 2+ DTRs and 2+ pulses.  Lymph: No lymphadenopathy of posterior or anterior cervical chain or axillae bilaterally.  Gait antalgic MSK:  Non tender with full range of motion and good stability and symmetric strength and tone of shoulders, elbows, wrist, hip, and ankles bilaterally.  Knee: Bilateral valgus deformity noted. Large thigh to calf ratio.  Tender to palpation over medial and PF joint line.  ROM full in flexion and extension and lower leg rotation. instability with valgus force.  painful patellar compression. Patellar glide with moderate crepitus. Patellar and quadriceps tendons unremarkable. Hamstring and quadriceps strength is normal.  After informed written and verbal consent, patient was seated on exam table. Right knee was prepped with alcohol swab and utilizing anterolateral approach, patient's right knee space was injected with 4:1  marcaine 0.5%: Kenalog 40mg /dL.  Patient tolerated the procedure well without immediate complications.  After informed written and verbal consent, patient was seated on exam table. Left knee was prepped with alcohol swab and utilizing anterolateral approach, patient's left knee space was injected with 4:1  marcaine 0.5%: Kenalog 40mg /dL. Patient tolerated the procedure well without immediate complications.    Impression and Recommendations:     This case required medical decision making of moderate complexity.      Note: This dictation was prepared with Dragon dictation along with smaller phrase technology. Any transcriptional errors that result from this process are unintentional.

## 2018-04-14 ENCOUNTER — Encounter

## 2018-04-14 ENCOUNTER — Encounter: Payer: Self-pay | Admitting: Family Medicine

## 2018-04-14 ENCOUNTER — Ambulatory Visit (INDEPENDENT_AMBULATORY_CARE_PROVIDER_SITE_OTHER): Payer: Medicare HMO | Admitting: Family Medicine

## 2018-04-14 DIAGNOSIS — M170 Bilateral primary osteoarthritis of knee: Secondary | ICD-10-CM

## 2018-04-14 NOTE — Assessment & Plan Note (Signed)
Bilateral injections given today.  We will get approval for Visco supplementation with patient failing all other conservative therapy.  We will discuss that at follow-up after the evaluation in 4 weeks.  Continue conservative therapy and given more trial of topical anti-inflammatories.

## 2018-04-14 NOTE — Patient Instructions (Signed)
Good to see you  Cathy [name] is your friend.  We will get approval for monovisc See me again in 4 weeks if knees are not great

## 2018-04-28 ENCOUNTER — Ambulatory Visit (INDEPENDENT_AMBULATORY_CARE_PROVIDER_SITE_OTHER): Payer: Medicare HMO | Admitting: Adult Health

## 2018-04-28 ENCOUNTER — Encounter: Payer: Self-pay | Admitting: Adult Health

## 2018-04-28 ENCOUNTER — Ambulatory Visit: Payer: Self-pay | Admitting: *Deleted

## 2018-04-28 VITALS — BP 110/60 | Temp 98.2°F | Wt 149.0 lb

## 2018-04-28 DIAGNOSIS — R350 Frequency of micturition: Secondary | ICD-10-CM | POA: Diagnosis not present

## 2018-04-28 DIAGNOSIS — T148XXA Other injury of unspecified body region, initial encounter: Secondary | ICD-10-CM | POA: Diagnosis not present

## 2018-04-28 DIAGNOSIS — G5603 Carpal tunnel syndrome, bilateral upper limbs: Secondary | ICD-10-CM | POA: Diagnosis not present

## 2018-04-28 LAB — POC URINALSYSI DIPSTICK (AUTOMATED)
BILIRUBIN UA: NEGATIVE
Clarity, UA: NEGATIVE
Glucose, UA: NEGATIVE
Ketones, UA: NEGATIVE
Leukocytes, UA: NEGATIVE
Nitrite, UA: NEGATIVE
PH UA: 6 (ref 5.0–8.0)
PROTEIN UA: NEGATIVE
RBC UA: NEGATIVE
Spec Grav, UA: 1.02 (ref 1.010–1.025)
UROBILINOGEN UA: 0.2 U/dL

## 2018-04-28 MED ORDER — NAPROXEN 500 MG PO TABS: 500.0000 mg | ORAL_TABLET | Freq: Two times a day (BID) | ORAL | 1 refills | Status: DC | PRN

## 2018-04-28 MED ORDER — FLUTICASONE PROPIONATE 50 MCG/ACT NA SUSP: 2.0000 | Freq: Every day | NASAL | 6 refills | Status: DC

## 2018-04-28 NOTE — Telephone Encounter (Signed)
Called in c/o both of her hands with intermittent numbness/tingling.   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  I have scheduled her for today at 11:00 with [name], AGNP-C.  I went over the s/s of stroke to be aware of and to go to the ED if they occurred.   She verbalized understanding.   Reason for Disposition . [1] Numbness or tingling in one or both hands AND [2] is a chronic symptom (recurrent or ongoing AND present > 4 weeks)  Answer Assessment - Initial Assessment Questions 1. SYMPTOM: "What is the main symptom you are concerned about?" (e.g., weakness, numbness)     Having numbness in both hands on and off with the left hand worse than the right.    I have some tingling in both hands. 2. ONSET: "When did this start?" (minutes, hours, days; while sleeping)     It used to do it a lot when I was working as a Scientist, water quality.   But then it stopped.   I'm not working now so it stopped.   But it started recently.   My left hand the index finger and my thumb got stuck for 2-3 seconds.   I pulled on the fingers and it was ok. 3. LAST NORMAL: "When was the last time you were normal (no symptoms)?"     *No Answer* 4. PATTERN "Does this come and go, or has it been constant since it started?"  "Is it present now?"     The numbness comes and goes.   It happens at night sometime. 5. CARDIAC SYMPTOMS: "Have you had any of the following symptoms: chest pain, difficulty breathing, palpitations?"     No 6. NEUROLOGIC SYMPTOMS: "Have you had any of the following symptoms: headache, dizziness, vision loss, double vision, changes in speech, unsteady on your feet?"     No 7. OTHER SYMPTOMS: "Do you have any other symptoms?"     Sometimes it's hard to use my hands when the numbness happens.   I've been retired for a month now.    8. PREGNANCY: "Is there any chance you are pregnant?" "When was your last menstrual  period?"     Not asked  Protocols used: NEUROLOGIC DEFICIT-A-AH

## 2018-04-28 NOTE — Progress Notes (Signed)
Subjective:    Patient ID: Cathy [name], female    DOB: 05-29-1946, 72 y.o.   MRN: [id number]  HPI  72 year old female who  has a past medical history of Chronic constipation, Depression, Diabetes mellitus type II, DJD (degenerative joint disease), Female cystocele, GERD (gastroesophageal reflux disease), History of esophagitis, History of MRSA infection, History of recurrent UTIs, Hyperlipidemia, Hypertension, and Urgency of urination.   She presents to the office today for multiple issues  1. Urinary Frequency -is a chronic issue.  She would like to be tested for urinary tract infection.  She denies any symptoms such as dysuria hematuria, low back pain, or abdominal pain. She has been drinking a lot of pepsi   2.  Healing sensation in bilateral hands -this is been an ongoing issue for at least the last few months.  She endorses tingling sensation in bilateral hands most notable in the thumb and index finger.  She does report some worsening tingling at night.  Associated symptoms include feeling as though has a "locking up sensation" in her thumb and finger, she does find it difficult to perform activities such as unscrewing jars.  3.  Right sided neck pain that is intermittent for the last 2 months.  Reports more discomfort when looking side to side and up and down.  She denies any trauma or blurred vision.  Is endorsing occasional headache.   Review of Systems See HPI   Past Medical History:  Diagnosis Date  . Chronic constipation   . Depression   . Diabetes mellitus type II   . DJD (degenerative joint disease)    knees  . Female cystocele   . GERD (gastroesophageal reflux disease)   . History of esophagitis   . History of MRSA infection    recurrent carbuncle  . History of recurrent UTIs   . Hyperlipidemia   . Hypertension   . Urgency of urination     Social History   Socioeconomic History  . Marital status: Single    Spouse name: Not on file  . Number of  children: Not on file  . Years of education: Not on file  . Highest education level: Not on file  Occupational History  . Not on file  Social Needs  . Financial resource strain: Not on file  . Food insecurity:    Worry: Not on file    Inability: Not on file  . Transportation needs:    Medical: Not on file    Non-medical: Not on file  Tobacco Use  . Smoking status: Former Smoker    Years: 1.00    Types: Cigarettes    Last attempt to quit: 02/28/1996    Years since quitting: 22.1  . Smokeless tobacco: Never Used  Substance and Sexual Activity  . Alcohol use: Yes    Alcohol/week: 0.0 oz    Comment: occasional  . Drug use: No  . Sexual activity: Not on file  Lifestyle  . Physical activity:    Days per week: Not on file    Minutes per session: Not on file  . Stress: Not on file  Relationships  . Social connections:    Talks on phone: Not on file    Gets together: Not on file    Attends religious service: Not on file    Active member of club or organization: Not on file    Attends meetings of clubs or organizations: Not on file    Relationship status: Not on  file  . Intimate partner violence:    Fear of current or ex partner: Not on file    Emotionally abused: Not on file    Physically abused: Not on file    Forced sexual activity: Not on file  Other Topics Concern  . Not on file  Social History Narrative   Single   Former Smoker  -  quit 10 to 11 years ago (light smoker)   Alcohol use-yes     2 children    Occupation: [employer]     Past Surgical History:  Procedure Laterality Date  . APPENDECTOMY  1980's  . BREAST EXCISIONAL BIOPSY Left 1989  . COLONOSCOPY  last one 06-18-2015  . INGUINAL HERNIA REPAIR Left 05-10-2001  . VAGINAL HYSTERECTOMY  1980's  . VAGINAL PROLAPSE REPAIR N/A 03/02/2016   Procedure: COLOPLAST ANTERIOR  VAULT REPAIR WITH AXIS DERMIS. SACROSPINUS FIXATION, AUGMENTATION WITH AXIS DERMIS;  Surgeon: Carolan Clines, MD;   Location: [facility];  Service: Urology;  Laterality: N/A;    Family History  Problem Relation Age of Onset  . Aneurysm Mother 65       deceased secondary to brain aneurysm  . Liver cancer Father 93       deceased  . Diabetes Unknown        grandmother  . Colon cancer Neg Hx   . Stomach cancer Neg Hx   . Rectal cancer Neg Hx   . Esophageal cancer Neg Hx     Allergies  Allergen Reactions  . Penicillins Hives    Has patient had a PCN reaction causing immediate rash, facial/tongue/throat swelling, SOB or lightheadedness with hypotension: Yes Has patient had a PCN reaction causing severe rash involving mucus membranes or skin necrosis: No Has patient had a PCN reaction that required hospitalization: No Has patient had a PCN reaction occurring within the last 10 years: No If all of the above answers are "NO", then may proceed with Cephalosporin use.     Current Outpatient Medications on File Prior to Visit  Medication Sig Dispense Refill  . aspirin 81 MG tablet Take 81 mg by mouth daily.      . Calcium Carbonate-Vitamin D (CALTRATE 600+D) 600-400 MG-UNIT per tablet Take 1 tablet by mouth daily.      [name] CRANBERRY PO Take 2 tablets by mouth daily.     . cyclobenzaprine (FLEXERIL) 5 MG tablet take 1 tablet by mouth three times a day if needed for muscle spasm  0  . gabapentin (NEURONTIN) 100 MG capsule Take 1 capsule (100 mg total) by mouth 3 (three) times daily. 90 capsule 3  . lisinopril (PRINIVIL,ZESTRIL) 10 MG tablet Take 1 tablet (10 mg total) by mouth daily. 90 tablet 1  . loratadine (CLARITIN) 10 MG tablet Take 10 mg by mouth daily.    . Misc Natural Products (TART CHERRY ADVANCED) CAPS Take 1 capsule by mouth daily.    . naproxen (NAPROSYN) 500 MG tablet Take 1 tablet (500 mg total) by mouth 2 (two) times daily as needed. Only as needed for pain. 30 tablet 0  . ONETOUCH DELICA LANCETS FINE MISC USE TO CHECK BLOOD SUGAR ONCE DAILY 100 each 3  . ONETOUCH VERIO  test strip TEST once daily 100 each 1  . polyethylene glycol (MIRALAX / GLYCOLAX) packet Take 17 g by mouth daily as needed for mild constipation.    . simvastatin (ZOCOR) 40 MG tablet take 1 tablet by mouth once daily AT  6 PM 90 tablet 3  . Tetrahydrozoline HCl (VISINE OP) Apply 1 drop to eye daily as needed (RED EYE).    [name] tolterodine (DETROL LA) 2 MG 24 hr capsule Take 1 capsule (2 mg total) by mouth daily. 30 capsule 2  . TURMERIC PO Take 1 tablet by mouth daily.     . [DISCONTINUED] valsartan (DIOVAN) 160 MG tablet Take 1/2 tablet by mouth once daily 30 tablet 5   No current facility-administered medications on file prior to visit.     BP 110/60   Temp 98.2 F (36.8 C) (Oral)   Wt 149 lb (67.6 kg)   BMI 25.58 kg/m       Objective:   Physical Exam  Constitutional: She is oriented to person, place, and time. She appears well-developed and well-nourished. No distress.  Cardiovascular: Normal rate, regular rhythm, normal heart sounds and intact distal pulses. Exam reveals no gallop and no friction rub.  No murmur heard. Pulmonary/Chest: Effort normal and breath sounds normal. No stridor. No respiratory distress. She has no wheezes. She has no rales. She exhibits no tenderness.  Abdominal: Soft. Bowel sounds are normal. She exhibits no distension and no mass. There is no tenderness. There is no rebound, no guarding and no CVA tenderness. No hernia.  Musculoskeletal: Normal range of motion. She exhibits tenderness (Right trapezius with palpation.  No difficulty with range of motion exercises). She exhibits no edema or deformity.  Neurological: She is alert and oriented to person, place, and time. She displays normal reflexes. No cranial nerve deficit or sensory deficit. She exhibits normal muscle tone. Coordination normal.  Negative Tinel's and Phalens No decreased grip strength    Skin: Skin is warm and dry. Capillary refill takes less than 2 seconds. She is not diaphoretic.    Psychiatric: She has a normal mood and affect. Her behavior is normal. Judgment and thought content normal.  Nursing note and vitals reviewed.     Assessment & Plan:  1. Urinary frequency - POCT Urinalysis Dipstick (Automated)- negative - Cut back on Pepsi   2. Bilateral carpal tunnel syndrome -Right sided cock-up splint given, we did not have a left-sided in the office today.  Advised to wear the this at night and she can pick up the left at any medical supply store.  Follow-up if no improvement in the next month.  Consider steroid injections - naproxen (NAPROSYN) 500 MG tablet; Take 1 tablet (500 mg total) by mouth 2 (two) times daily as needed. Only as needed for pain.  Dispense: 180 tablet; Refill: 1  3. Muscle strain -Exam appears to be more muscular in nature.  Will re-prescribe naproxen and advised to use heating pad - naproxen (NAPROSYN) 500 MG tablet; Take 1 tablet (500 mg total) by mouth 2 (two) times daily as needed. Only as needed for pain.  Dispense: 180 tablet; Refill: 1   [name], NP

## 2018-04-29 ENCOUNTER — Telehealth: Payer: Self-pay | Admitting: *Deleted

## 2018-04-29 NOTE — Telephone Encounter (Signed)
Copied from [place] [id number]. Topic: Inquiry >> Apr 29, 2018  9:51 AM Cathy [name], NT wrote: Reason for CRM: patient is calling and states that she was seen on 04/28/18 and states she had ringing in her ears and was told it was fluid. She states Cory prescribed her Flonase and would like to know will this help with the fluid and how long should she expect to hear the ringing.

## 2018-04-29 NOTE — Telephone Encounter (Signed)
Spoke to the pt.  Informed her the Flonase will her with the fluid and to take as [facility] prescribed.  She will call back if needed.

## 2018-05-12 ENCOUNTER — Other Ambulatory Visit: Payer: Self-pay | Admitting: Adult Health

## 2018-05-12 ENCOUNTER — Ambulatory Visit: Payer: Medicare HMO | Admitting: Family Medicine

## 2018-05-12 NOTE — Telephone Encounter (Signed)
Sent to the pharmacy by e-scribe. 

## 2018-05-20 ENCOUNTER — Encounter: Payer: Self-pay | Admitting: Family Medicine

## 2018-05-20 ENCOUNTER — Ambulatory Visit (INDEPENDENT_AMBULATORY_CARE_PROVIDER_SITE_OTHER): Payer: Medicare HMO | Admitting: Family Medicine

## 2018-05-20 VITALS — BP 118/72 | HR 96 | Temp 97.9°F | Wt 148.6 lb

## 2018-05-20 DIAGNOSIS — R350 Frequency of micturition: Secondary | ICD-10-CM

## 2018-05-20 DIAGNOSIS — N3001 Acute cystitis with hematuria: Secondary | ICD-10-CM | POA: Diagnosis not present

## 2018-05-20 LAB — POCT URINALYSIS DIPSTICK
BILIRUBIN UA: NEGATIVE
Glucose, UA: NEGATIVE
Ketones, UA: NEGATIVE
Nitrite, UA: POSITIVE
PH UA: 8 (ref 5.0–8.0)
Protein, UA: POSITIVE — AB
RBC UA: POSITIVE
SPEC GRAV UA: 1.01 (ref 1.010–1.025)
UROBILINOGEN UA: 0.2 U/dL

## 2018-05-20 MED ORDER — SULFAMETHOXAZOLE-TRIMETHOPRIM 800-160 MG PO TABS: 1.0000 | ORAL_TABLET | Freq: Two times a day (BID) | ORAL | 0 refills | Status: AC

## 2018-05-20 NOTE — Patient Instructions (Signed)

## 2018-05-20 NOTE — Progress Notes (Signed)
Subjective:    Patient ID: Cathy [name], female    DOB: [birth year], 72 y.o.   MRN: [id number]  Chief Complaint  Patient presents with  . Urinary Tract Infection    having Gas, lower back pain, burning when urinating, frequent urination     HPI Patient was seen today for acute concern.  Pt endorses urinary frequency, low back pain, slight discomfort with urination times a few days.  Denies fever, chills, n/v, hematuria.  Pt has not tried anything for her symptoms.  Pt has a h/o frequent UTIs.  States needs to have a bladder tack done.  Pt is s/p hysterectomy.  Pt also endorses gas pain for which she is tried Tums, Alka-Seltzer. Has a h/o hiatal hernia, not on a ppi.  Past Medical History:  Diagnosis Date  . Chronic constipation   . Depression   . Diabetes mellitus type II   . DJD (degenerative joint disease)    knees  . Female cystocele   . GERD (gastroesophageal reflux disease)   . History of esophagitis   . History of MRSA infection    recurrent carbuncle  . History of recurrent UTIs   . Hyperlipidemia   . Hypertension   . Urgency of urination     Allergies  Allergen Reactions  . Penicillins Hives    Has patient had a PCN reaction causing immediate rash, facial/tongue/throat swelling, SOB or lightheadedness with hypotension: Yes Has patient had a PCN reaction causing severe rash involving mucus membranes or skin necrosis: No Has patient had a PCN reaction that required hospitalization: No Has patient had a PCN reaction occurring within the last 10 years: No If all of the above answers are "NO", then may proceed with Cephalosporin use.     ROS General: Denies fever, chills, night sweats, changes in weight, changes in appetite HEENT: Denies headaches, ear pain, changes in vision, rhinorrhea, sore throat CV: Denies CP, palpitations, SOB, orthopnea Pulm: Denies SOB, cough, wheezing GI: Denies abdominal pain, nausea, vomiting, diarrhea, constipation  +gas GU: Denies  dysuria, hematuria, frequency, vaginal discharge  +urinany frequency, mild discomfort with urination Msk: Denies muscle cramps, joint pains  +low back pain Neuro: Denies weakness, numbness, tingling Skin: Denies rashes, bruising Psych: Denies depression, anxiety, hallucinations     Objective:    Blood pressure 118/72, pulse 96, temperature 97.9 F (36.6 C), temperature source Oral, weight 148 lb 9.6 oz (67.4 kg), SpO2 97 %.   Gen. Pleasant, well-nourished, in no distress, normal affect   HEENT: [place]/AT, face symmetric, no scleral icterus, PERRLA, nares patent without drainage Lungs: no accessory muscle use, CTAB, no wheezes or rales Cardiovascular: RRR, no m/r/g, no peripheral edema Abdomen: BS present, soft, mild suprapubic pressure, ND, no CVA tenderness Neuro:  A&Ox3, CN II-XII intact, normal gait   Wt Readings from Last 3 Encounters:  05/20/18 148 lb 9.6 oz (67.4 kg)  04/28/18 149 lb (67.6 kg)  04/14/18 148 lb (67.1 kg)    Lab Results  Component Value Date   WBC 12.1 (H) 12/02/2017   HGB 13.3 12/02/2017   HCT 41.1 12/02/2017   PLT 306 12/02/2017   GLUCOSE 136 (H) 12/02/2017   CHOL 184 07/01/2017   TRIG 88.0 07/01/2017   HDL 63.20 07/01/2017   LDLDIRECT 118.2 11/21/2013   LDLCALC 103 (H) 07/01/2017   ALT 18 07/01/2017   AST 19 07/01/2017   NA 136 12/02/2017   K 3.8 12/02/2017   CL 104 12/02/2017   CREATININE 0.88 12/02/2017  BUN 23 (H) 12/02/2017   CO2 24 12/02/2017   TSH 0.93 07/01/2017   HGBA1C 6.2 01/25/2018   MICROALBUR 1.8 06/24/2016    Assessment/Plan:  Acute cystitis with hematuria  -discussed increasing po intake of fluids -given handout -will send for UCx - Plan: sulfamethoxazole-trimethoprim (BACTRIM DS,SEPTRA DS) 800-160 MG tablet  Urinary frequency  - Plan: POCT urinalysis dipstick  Pt to try OTC gas x and increase physical activity.  F/u with pcp prn  Grier Mitts, MD

## 2018-05-23 LAB — URINE CULTURE
MICRO NUMBER: 90774947
SPECIMEN QUALITY:: ADEQUATE

## 2018-05-27 ENCOUNTER — Other Ambulatory Visit: Payer: Self-pay | Admitting: Adult Health

## 2018-05-27 DIAGNOSIS — N3001 Acute cystitis with hematuria: Secondary | ICD-10-CM

## 2018-05-27 NOTE — Telephone Encounter (Signed)
Copied from [place] [id number]. Topic: Quick Communication - Rx Refill/Question >> May 27, 2018  8:52 AM Antonieta Iba C wrote: Medication: sulfamethoxazole-trimethoprim (BACTRIM DS,SEPTRA DS) 800-160 MG tablet  Has the patient contacted their pharmacy? No  (Agent: If no, request that the patient contact the pharmacy for the refill.) (Agent: If yes, when and what did the pharmacy advise?)  Preferred Pharmacy (with phone number or street name): [facility] [phone] - [place], [address] AT [address] [phone] (Phone) [phone] (Fax)      Agent: Please be advised that RX refills may take up to 3 business days. We ask that you follow-up with your pharmacy.

## 2018-05-27 NOTE — Telephone Encounter (Signed)
Call to patient- patient states she is still have tingling and she is requesting retreatment for urinary symptoms.patient declined to make appointment now- she wants medication only. Patient advised would send request.  Rx refill request: Bactrim DS  LOV: 05/20/18-acuteVolanda Napoleon  PQD:[id number]  Pharmacy: verified

## 2018-05-30 DIAGNOSIS — M6289 Other specified disorders of muscle: Secondary | ICD-10-CM | POA: Diagnosis not present

## 2018-05-30 DIAGNOSIS — M62838 Other muscle spasm: Secondary | ICD-10-CM | POA: Diagnosis not present

## 2018-05-30 DIAGNOSIS — R351 Nocturia: Secondary | ICD-10-CM | POA: Diagnosis not present

## 2018-05-30 DIAGNOSIS — R3914 Feeling of incomplete bladder emptying: Secondary | ICD-10-CM | POA: Diagnosis not present

## 2018-05-30 DIAGNOSIS — K5900 Constipation, unspecified: Secondary | ICD-10-CM | POA: Diagnosis not present

## 2018-05-30 DIAGNOSIS — M6281 Muscle weakness (generalized): Secondary | ICD-10-CM | POA: Diagnosis not present

## 2018-06-20 ENCOUNTER — Telehealth: Payer: Self-pay | Admitting: Adult Health

## 2018-06-20 DIAGNOSIS — E1121 Type 2 diabetes mellitus with diabetic nephropathy: Secondary | ICD-10-CM

## 2018-06-20 NOTE — Telephone Encounter (Signed)
Copied from [place] [id number] [id number]. Topic: Quick Communication - Rx Refill/Question >> Jun 20, 2018  8:29 AM Alfredia Ferguson R wrote: Medication: Roma Schanz test strip  Has the patient contacted their pharmacy? Yes  Preferred Pharmacy (with phone number or street name): Agent: Please be advised that RX refills may take up to 3 business days. We ask that you follow-up with your pharmacy.

## 2018-06-21 MED ORDER — GLUCOSE BLOOD VI STRP: ORAL_STRIP | 3 refills | Status: DC

## 2018-06-21 NOTE — Telephone Encounter (Signed)
Sent to the pharmacy by e-scribe. 

## 2018-07-01 ENCOUNTER — Encounter: Payer: Self-pay | Admitting: Family Medicine

## 2018-07-07 ENCOUNTER — Encounter: Payer: Self-pay | Admitting: Family Medicine

## 2018-07-07 ENCOUNTER — Ambulatory Visit: Payer: Medicare HMO | Admitting: Family Medicine

## 2018-07-07 DIAGNOSIS — M170 Bilateral primary osteoarthritis of knee: Secondary | ICD-10-CM

## 2018-07-07 NOTE — Assessment & Plan Note (Signed)
Failed all conservative therapy.  Worsening symptoms.  Given Monovisc injections today.  Tolerated the procedure well.  Discussed that it can take 4 weeks to work.  Discussed icing regimen and home exercise.  Follow-up again in 4 to 6 weeks

## 2018-07-07 NOTE — Patient Instructions (Signed)
Good to see you  We do monovisc this time and should do well  Can take a month to really work well  Then hopefully will last 6 months.  Make an appointment with me again in 3 months just in case.

## 2018-07-07 NOTE — Progress Notes (Signed)
[name] [address], [place] [address] Phone: [phone] Subjective:     CC: Bilateral knee pain  HKV:[id number]  Cathy [name] is a 72 y.o. female coming in with complaint of bilateral knee pain, left greater than right. She notes some swelling in the left knee for 2 days. She just got back from the beach but did not have any troubles in the sand. She states that her knee is very stiff and is having pain at night.    Patient is having worsening symptoms.  Did respond somewhat to Visco supplementation previously.    Past Medical History:  Diagnosis Date  . Chronic constipation   . Depression   . Diabetes mellitus type II   . DJD (degenerative joint disease)    knees  . Female cystocele   . GERD (gastroesophageal reflux disease)   . History of esophagitis   . History of MRSA infection    recurrent carbuncle  . History of recurrent UTIs   . Hyperlipidemia   . Hypertension   . Urgency of urination    Past Surgical History:  Procedure Laterality Date  . APPENDECTOMY  1980's  . BREAST EXCISIONAL BIOPSY Left 1989  . COLONOSCOPY  last one 06-18-2015  . INGUINAL HERNIA REPAIR Left 05-10-2001  . VAGINAL HYSTERECTOMY  1980's  . VAGINAL PROLAPSE REPAIR N/A 03/02/2016   Procedure: COLOPLAST ANTERIOR  VAULT REPAIR WITH AXIS DERMIS. SACROSPINUS FIXATION, AUGMENTATION WITH AXIS DERMIS;  Surgeon: Carolan Clines, MD;  Location: [facility];  Service: Urology;  Laterality: N/A;   Social History   Socioeconomic History  . Marital status: Single    Spouse name: Not on file  . Number of children: Not on file  . Years of education: Not on file  . Highest education level: Not on file  Occupational History  . Not on file  Social Needs  . Financial resource strain: Not on file  . Food insecurity:    Worry: Not on file    Inability: Not on file  . Transportation needs:    Medical: Not on file    Non-medical: Not on file    Tobacco Use  . Smoking status: Former Smoker    Years: 1.00    Types: Cigarettes    Last attempt to quit: 02/28/1996    Years since quitting: 22.3  . Smokeless tobacco: Never Used  Substance and Sexual Activity  . Alcohol use: Yes    Alcohol/week: 0.0 standard drinks    Comment: occasional  . Drug use: No  . Sexual activity: Not on file  Lifestyle  . Physical activity:    Days per week: Not on file    Minutes per session: Not on file  . Stress: Not on file  Relationships  . Social connections:    Talks on phone: Not on file    Gets together: Not on file    Attends religious service: Not on file    Active member of club or organization: Not on file    Attends meetings of clubs or organizations: Not on file    Relationship status: Not on file  Other Topics Concern  . Not on file  Social History Narrative   Single   Former Smoker  -  quit 10 to 11 years ago (light smoker)   Alcohol use-yes     2 children    Occupation: [employer]    Allergies  Allergen Reactions  .  Penicillins Hives    Has patient had a PCN reaction causing immediate rash, facial/tongue/throat swelling, SOB or lightheadedness with hypotension: Yes Has patient had a PCN reaction causing severe rash involving mucus membranes or skin necrosis: No Has patient had a PCN reaction that required hospitalization: No Has patient had a PCN reaction occurring within the last 10 years: No If all of the above answers are "NO", then may proceed with Cephalosporin use.    Family History  Problem Relation Age of Onset  . Aneurysm Mother 48       deceased secondary to brain aneurysm  . Liver cancer Father 81       deceased  . Diabetes Unknown        grandmother  . Colon cancer Neg Hx   . Stomach cancer Neg Hx   . Rectal cancer Neg Hx   . Esophageal cancer Neg Hx      Past medical history, social, surgical and family history all reviewed in electronic medical record.  No pertanent information  unless stated regarding to the chief complaint.   Review of Systems:Review of systems updated and as accurate as of 07/07/18  No headache, visual changes, nausea, vomiting, diarrhea, constipation, dizziness, abdominal pain, skin rash, fevers, chills, night sweats, weight loss, swollen lymph nodes, body aches,  chest pain, shortness of breath, mood changes.  Positive muscle aches, joint swelling  Objective  Blood pressure 119/78, pulse 82, height 5\' 4"  (1.626 m), weight 149 lb (67.6 kg), SpO2 93 %. Systems examined below as of 07/07/18   General: No apparent distress alert and oriented x3 mood and affect normal, dressed appropriately.  HEENT: Pupils equal, extraocular movements intact  Respiratory: Patient's speak in full sentences and does not appear short of breath  Cardiovascular: No lower extremity edema, non tender, no erythema  Skin: Warm dry intact with no signs of infection or rash on extremities or on axial skeleton.  Abdomen: Soft nontender  Neuro: Cranial nerves II through XII are intact, neurovascularly intact in all extremities with 2+ DTRs and 2+ pulses.  Lymph: No lymphadenopathy of posterior or anterior cervical chain or axillae bilaterally.  Gait antalgic gait MSK:  Non tender with full range of motion and good stability and symmetric strength and tone of shoulders, elbows, wrist, hip, and ankles bilaterally.  Arthritic changes of multiple other joints   Knee: Bilateral valgus deformity noted. Large thigh to calf ratio.  Tender to palpation over medial and PF joint line.  ROM full in flexion and extension and lower leg rotation. instability with valgus force.  painful patellar compression. Patellar glide with moderate crepitus. Patellar and quadriceps tendons unremarkable. Hamstring and quadriceps strength is normal.   After informed written and verbal consent, patient was seated on exam table. Right knee was prepped with alcohol swab and utilizing anterolateral  approach, patient's right knee space was injected with22 mg/1 mL of Monovisc (sodium hyaluronate) in a prefilled syringe was injected easily into the knee through a 22-gauge needle..Patient tolerated the procedure well without immediate complications.  After informed written and verbal consent, patient was seated on exam table. Left knee was prepped with alcohol swab and utilizing anterolateral approach, patient's left knee space was injected with22 mg/1 mL of Monovisc (sodium hyaluronate) in a prefilled syringe was injected easily into the knee through a 22-gauge needle..Patient tolerated the procedure well without immediate complications.  Impression and Recommendations:     This case required medical decision making of moderate complexity.  Note: This dictation was prepared with Dragon dictation along with smaller phrase technology. Any transcriptional errors that result from this process are unintentional.

## 2018-07-13 ENCOUNTER — Encounter: Payer: Self-pay | Admitting: Adult Health

## 2018-07-13 ENCOUNTER — Ambulatory Visit (INDEPENDENT_AMBULATORY_CARE_PROVIDER_SITE_OTHER): Payer: Medicare HMO | Admitting: Adult Health

## 2018-07-13 VITALS — BP 116/78 | Temp 98.0°F | Wt 155.0 lb

## 2018-07-13 DIAGNOSIS — I10 Essential (primary) hypertension: Secondary | ICD-10-CM | POA: Diagnosis not present

## 2018-07-13 DIAGNOSIS — E118 Type 2 diabetes mellitus with unspecified complications: Secondary | ICD-10-CM

## 2018-07-13 DIAGNOSIS — Z0000 Encounter for general adult medical examination without abnormal findings: Secondary | ICD-10-CM

## 2018-07-13 DIAGNOSIS — E785 Hyperlipidemia, unspecified: Secondary | ICD-10-CM | POA: Diagnosis not present

## 2018-07-13 LAB — BASIC METABOLIC PANEL
BUN: 16 mg/dL (ref 6–23)
CHLORIDE: 100 meq/L (ref 96–112)
CO2: 29 meq/L (ref 19–32)
CREATININE: 0.98 mg/dL (ref 0.40–1.20)
Calcium: 10.2 mg/dL (ref 8.4–10.5)
GFR: 72.21 mL/min (ref >60.00)
Glucose, Bld: 87 mg/dL (ref 70–99)
Potassium: 4.4 mEq/L (ref 3.5–5.1)
Sodium: 137 mEq/L (ref 135–145)

## 2018-07-13 LAB — CBC WITH DIFFERENTIAL/PLATELET
BASOS PCT: 0.7 % (ref 0.0–3.0)
Basophils Absolute: 0.1 10*3/uL (ref 0.0–0.1)
EOS ABS: 0.2 10*3/uL (ref 0.0–0.7)
EOS PCT: 3.2 % (ref 0.0–5.0)
HEMATOCRIT: 42.4 % (ref 36.0–46.0)
HEMOGLOBIN: 13.6 g/dL (ref 12.0–15.0)
LYMPHS PCT: 25.9 % (ref 12.0–46.0)
Lymphs Abs: 1.9 10*3/uL (ref 0.7–4.0)
MCHC: 32 g/dL (ref 30.0–36.0)
MCV: 80.6 fl (ref 78.0–100.0)
Monocytes Absolute: 0.5 10*3/uL (ref 0.1–1.0)
Monocytes Relative: 7.2 % (ref 3.0–12.0)
Neutro Abs: 4.7 10*3/uL (ref 1.4–7.7)
Neutrophils Relative %: 63 % (ref 43.0–77.0)
PLATELETS: 300 10*3/uL (ref 150.0–400.0)
RBC: 5.26 Mil/uL — ABNORMAL HIGH (ref 3.87–5.11)
RDW: 15.7 % — AB (ref 11.5–15.5)
WBC: 7.5 10*3/uL (ref 4.0–10.5)

## 2018-07-13 LAB — POCT URINALYSIS DIPSTICK
Bilirubin, UA: NEGATIVE
GLUCOSE UA: NEGATIVE
Ketones, UA: NEGATIVE
LEUKOCYTES UA: NEGATIVE
Nitrite, UA: NEGATIVE
Odor: NEGATIVE
Protein, UA: NEGATIVE
RBC UA: NEGATIVE
SPEC GRAV UA: 1.015 (ref 1.010–1.025)
UROBILINOGEN UA: 0.2 U/dL
pH, UA: 6.5 (ref 5.0–8.0)

## 2018-07-13 LAB — TSH: TSH: 1.78 u[IU]/mL (ref 0.35–4.50)

## 2018-07-13 LAB — LIPID PANEL
CHOLESTEROL: 191 mg/dL (ref 0–200)
HDL: 74 mg/dL (ref >39.00)
LDL CALC: 100 mg/dL — AB (ref 0–99)
NonHDL: 117.14
TRIGLYCERIDES: 85 mg/dL (ref 0.0–149.0)
Total CHOL/HDL Ratio: 3
VLDL: 17 mg/dL (ref 0.0–40.0)

## 2018-07-13 LAB — HEPATIC FUNCTION PANEL
ALT: 17 U/L (ref 0–35)
AST: 17 U/L (ref 0–37)
Albumin: 4.5 g/dL (ref 3.5–5.2)
Alkaline Phosphatase: 71 U/L (ref 39–117)
BILIRUBIN TOTAL: 0.7 mg/dL (ref 0.2–1.2)
Bilirubin, Direct: 0.1 mg/dL (ref 0.0–0.3)
Total Protein: 7 g/dL (ref 6.0–8.3)

## 2018-07-13 LAB — MICROALBUMIN / CREATININE URINE RATIO
CREATININE, U: 57.6 mg/dL
Microalb Creat Ratio: 1.2 mg/g (ref 0.0–30.0)

## 2018-07-13 LAB — HEMOGLOBIN A1C: Hgb A1c MFr Bld: 6.5 % (ref 4.6–6.5)

## 2018-07-13 MED ORDER — CYCLOBENZAPRINE HCL 5 MG PO TABS: ORAL_TABLET | ORAL | 1 refills | Status: DC

## 2018-07-13 NOTE — Progress Notes (Signed)
Subjective:    Patient ID: Cathy [name], female    DOB: 1946/10/13, 72 y.o.   MRN: [id number]  HPI  Patient presents for yearly preventative medicine examination. She is a pleasant 72 year old female who  has a past medical history of Chronic constipation, Depression, Diabetes mellitus type II, DJD (degenerative joint disease), Female cystocele, GERD (gastroesophageal reflux disease), History of esophagitis, History of MRSA infection, History of recurrent UTIs, Hyperlipidemia, Hypertension, and Urgency of urination.  Diabetes - Diet controlled. She does not monitor her blood sugars are on regular basis but when she does her readings are below 120. Denies any hypoglycemic symptoms  Lab Results  Component Value Date   HGBA1C 6.2 01/25/2018   Hypertension  - Takes lisinopril 10 mg daily  BP Readings from Last 3 Encounters:  07/07/18 119/78  05/20/18 118/72  04/28/18 110/60   Hyperlipidemia - She takes simvastatin 40 mg  Lab Results  Component Value Date   CHOL 184 07/01/2017   HDL 63.20 07/01/2017   LDLCALC 103 (H) 07/01/2017   LDLDIRECT 118.2 11/21/2013   TRIG 88.0 07/01/2017   CHOLHDL 3 07/01/2017   Arthritis -Uses naproxen as needed.  He was seen by sports medicine last week and had Monovisc injections to bilateral knees  She is up to date on her colonoscopy and mammogram. She does not participate in regular dental or vision exams.   All immunizations and health maintenance protocols were reviewed with the patient and needed orders were placed.  Appropriate screening laboratory values were ordered for the patient including screening of hyperlipidemia, renal function and hepatic function.  Medication reconciliation,  past medical history, social history, problem list and allergies were reviewed in detail with the patient  Goals were established with regard to weight loss, exercise, and  diet in compliance with medications.  She continues to follow a diabetic diet and  works out multiple times a week at [employer]. Wt Readings from Last 3 Encounters:  07/13/18 155 lb (70.3 kg)  07/07/18 149 lb (67.6 kg)  05/20/18 148 lb 9.6 oz (67.4 kg)   End of life planning was discussed.  She is up-to-date on routine health maintenance items such as colonoscopy, diabetic eye exam, and mammogram.She does monthly self breast exams and denies any recent changes   Review of Systems  Constitutional: Negative.   HENT: Negative.   Eyes: Negative.   Respiratory: Negative.   Cardiovascular: Negative.   Gastrointestinal: Negative.   Endocrine: Negative.   Genitourinary: Negative.   Musculoskeletal: Positive for arthralgias (chronic ) and back pain (chronic ).  Skin: Negative.   Allergic/Immunologic: Negative.   Neurological: Negative.   Hematological: Negative.   Psychiatric/Behavioral: Negative.    Past Medical History:  Diagnosis Date  . Chronic constipation   . Depression   . Diabetes mellitus type II   . DJD (degenerative joint disease)    knees  . Female cystocele   . GERD (gastroesophageal reflux disease)   . History of esophagitis   . History of MRSA infection    recurrent carbuncle  . History of recurrent UTIs   . Hyperlipidemia   . Hypertension   . Urgency of urination     Social History   Socioeconomic History  . Marital status: Single    Spouse name: Not on file  . Number of children: Not on file  . Years of education: Not on file  . Highest education level: Not on file  Occupational History  . Not on  file  Social Needs  . Financial resource strain: Not on file  . Food insecurity:    Worry: Not on file    Inability: Not on file  . Transportation needs:    Medical: Not on file    Non-medical: Not on file  Tobacco Use  . Smoking status: Former Smoker    Years: 1.00    Types: Cigarettes    Last attempt to quit: 02/28/1996    Years since quitting: 22.3  . Smokeless tobacco: Never Used  Substance and Sexual Activity  . Alcohol use:  Yes    Alcohol/week: 0.0 standard drinks    Comment: occasional  . Drug use: No  . Sexual activity: Not on file  Lifestyle  . Physical activity:    Days per week: Not on file    Minutes per session: Not on file  . Stress: Not on file  Relationships  . Social connections:    Talks on phone: Not on file    Gets together: Not on file    Attends religious service: Not on file    Active member of club or organization: Not on file    Attends meetings of clubs or organizations: Not on file    Relationship status: Not on file  . Intimate partner violence:    Fear of current or ex partner: Not on file    Emotionally abused: Not on file    Physically abused: Not on file    Forced sexual activity: Not on file  Other Topics Concern  . Not on file  Social History Narrative   Single   Former Smoker  -  quit 10 to 11 years ago (light smoker)   Alcohol use-yes     2 children    Occupation: [employer]     Past Surgical History:  Procedure Laterality Date  . APPENDECTOMY  1980's  . BREAST EXCISIONAL BIOPSY Left 1989  . COLONOSCOPY  last one 06-18-2015  . INGUINAL HERNIA REPAIR Left 05-10-2001  . VAGINAL HYSTERECTOMY  1980's  . VAGINAL PROLAPSE REPAIR N/A 03/02/2016   Procedure: COLOPLAST ANTERIOR  VAULT REPAIR WITH AXIS DERMIS. SACROSPINUS FIXATION, AUGMENTATION WITH AXIS DERMIS;  Surgeon: Carolan Clines, MD;  Location: [facility];  Service: Urology;  Laterality: N/A;    Family History  Problem Relation Age of Onset  . Aneurysm Mother 63       deceased secondary to brain aneurysm  . Liver cancer Father 39       deceased  . Diabetes Unknown        grandmother  . Colon cancer Neg Hx   . Stomach cancer Neg Hx   . Rectal cancer Neg Hx   . Esophageal cancer Neg Hx     Allergies  Allergen Reactions  . Penicillins Hives    Has patient had a PCN reaction causing immediate rash, facial/tongue/throat swelling, SOB or lightheadedness with  hypotension: Yes Has patient had a PCN reaction causing severe rash involving mucus membranes or skin necrosis: No Has patient had a PCN reaction that required hospitalization: No Has patient had a PCN reaction occurring within the last 10 years: No If all of the above answers are "NO", then may proceed with Cephalosporin use.     Current Outpatient Medications on File Prior to Visit  Medication Sig Dispense Refill  . aspirin 81 MG tablet Take 81 mg by mouth daily.      . Calcium Carbonate-Vitamin D (CALTRATE 600+D) 600-400 MG-UNIT  per tablet Take 1 tablet by mouth daily.      [name] CRANBERRY PO Take 2 tablets by mouth daily.     . cyclobenzaprine (FLEXERIL) 5 MG tablet take 1 tablet by mouth three times a day if needed for muscle spasm  0  . fluticasone (FLONASE) 50 MCG/ACT nasal spray Place 2 sprays into both nostrils daily. 16 g 6  . gabapentin (NEURONTIN) 100 MG capsule Take 1 capsule (100 mg total) by mouth 3 (three) times daily. 90 capsule 3  . glucose blood (ONETOUCH VERIO) test strip USE TO TEST BLOOD GLUCOSE TWICE DAILY. 200 each 3  . lisinopril (PRINIVIL,ZESTRIL) 10 MG tablet Take 1 tablet (10 mg total) by mouth daily. 90 tablet 1  . loratadine (CLARITIN) 10 MG tablet Take 10 mg by mouth daily.    . Misc Natural Products (TART CHERRY ADVANCED) CAPS Take 1 capsule by mouth daily.    . naproxen (NAPROSYN) 500 MG tablet Take 1 tablet (500 mg total) by mouth 2 (two) times daily as needed. Only as needed for pain. 180 tablet 1  . ONETOUCH DELICA LANCETS FINE MISC USE TO CHECK BLOOD SUGAR ONCE DAILY 100 each 3  . polyethylene glycol (MIRALAX / GLYCOLAX) packet Take 17 g by mouth daily as needed for mild constipation.    . simvastatin (ZOCOR) 40 MG tablet TAKE 1 TABLET BY MOUTH ONCE DAILY AT 6PM 90 tablet 0  . Tetrahydrozoline HCl (VISINE OP) Apply 1 drop to eye daily as needed (RED EYE).    [name] tolterodine (DETROL LA) 2 MG 24 hr capsule Take 1 capsule (2 mg total) by mouth daily. 30 capsule 2    . TURMERIC PO Take 1 tablet by mouth daily.     . [DISCONTINUED] valsartan (DIOVAN) 160 MG tablet Take 1/2 tablet by mouth once daily 30 tablet 5   No current facility-administered medications on file prior to visit.     There were no vitals taken for this visit.      Objective:   Physical Exam  Constitutional: She is oriented to person, place, and time. She appears well-developed and well-nourished. No distress.  HENT:  Head: Normocephalic and atraumatic.  Right Ear: External ear normal.  Left Ear: External ear normal.  Nose: Nose normal.  Mouth/Throat: Oropharynx is clear and moist. No oropharyngeal exudate.  Eyes: Pupils are equal, round, and reactive to light. Conjunctivae and EOM are normal. Right eye exhibits no discharge. Left eye exhibits no discharge. No scleral icterus.  Neck: Normal range of motion. Neck supple. No JVD present. No tracheal deviation present. No thyromegaly present.  Cardiovascular: Normal rate, regular rhythm, normal heart sounds and intact distal pulses. Exam reveals no gallop and no friction rub.  No murmur heard. Pulmonary/Chest: Effort normal and breath sounds normal. No respiratory distress. She has no wheezes. She has no rales. She exhibits no tenderness. Right breast exhibits no inverted nipple, no mass, no nipple discharge, no skin change and no tenderness. Left breast exhibits no inverted nipple, no mass, no nipple discharge, no skin change and no tenderness. No breast swelling, tenderness, discharge or bleeding. Breasts are symmetrical.  Abdominal: Soft. Bowel sounds are normal. She exhibits no distension and no mass. There is no tenderness. There is no rebound and no guarding. No hernia.  Musculoskeletal: Normal range of motion. She exhibits no edema, tenderness or deformity.  Lymphadenopathy:    She has no cervical adenopathy.  Neurological: She is alert and oriented to person, place, and time. She displays  normal reflexes. No cranial nerve  deficit or sensory deficit. She exhibits normal muscle tone. Coordination normal.  Skin: Skin is warm and dry. Capillary refill takes less than 2 seconds. No rash noted. She is not diaphoretic. No erythema. No pallor.  Psychiatric: She has a normal mood and affect. Her behavior is normal. Judgment and thought content normal.  Nursing note and vitals reviewed.     Assessment & Plan:  1. Routine general medical examination at a health care facility - Encouraged to continue with diet and exercise  - Follow up in one year or sooner if needed - Basic metabolic panel - CBC with Differential/Platelet - Hemoglobin A1c - Hepatic function panel - Lipid panel - TSH  2. Controlled type 2 diabetes mellitus with complication, without long-term current use of insulin ([place]) - Consider adding Metformin  - Likely follow up in 6 months  - Basic metabolic panel - CBC with Differential/Platelet - Hemoglobin A1c - Hepatic function panel - Lipid panel - TSH - POC Urinalysis Dipstick - Microalbumin / creatinine urine ratio  3. Dyslipidemia - Consider increase in statin  - Basic metabolic panel - CBC with Differential/Platelet - Hemoglobin A1c - Hepatic function panel - Lipid panel - TSH  4. Essential hypertension - Well controlled. No change in medications  - Basic metabolic panel - CBC with Differential/Platelet - Hemoglobin A1c - Hepatic function panel - Lipid panel - TSH  [name], NP

## 2018-07-21 ENCOUNTER — Ambulatory Visit (INDEPENDENT_AMBULATORY_CARE_PROVIDER_SITE_OTHER): Payer: Medicare HMO | Admitting: Adult Health

## 2018-07-21 ENCOUNTER — Encounter: Payer: Self-pay | Admitting: Adult Health

## 2018-07-21 VITALS — BP 120/70 | Temp 98.1°F | Wt 153.0 lb

## 2018-07-21 DIAGNOSIS — R350 Frequency of micturition: Secondary | ICD-10-CM

## 2018-07-21 LAB — POC URINALSYSI DIPSTICK (AUTOMATED)
Bilirubin, UA: NEGATIVE
GLUCOSE UA: NEGATIVE
Ketones, UA: NEGATIVE
NITRITE UA: NEGATIVE
Protein, UA: NEGATIVE
Spec Grav, UA: 1.015 (ref 1.010–1.025)
Urobilinogen, UA: 0.2 E.U./dL
pH, UA: 7 (ref 5.0–8.0)

## 2018-07-21 MED ORDER — SULFAMETHOXAZOLE-TRIMETHOPRIM 800-160 MG PO TABS: 1.0000 | ORAL_TABLET | Freq: Two times a day (BID) | ORAL | 0 refills | Status: DC

## 2018-07-21 NOTE — Progress Notes (Signed)
Subjective:    Patient ID: Cathy [name], female    DOB: 1946-04-24, 72 y.o.   MRN: [id number]  HPI  72 year old female who  has a past medical history of Chronic constipation, Depression, Diabetes mellitus type II, DJD (degenerative joint disease), Female cystocele, GERD (gastroesophageal reflux disease), History of esophagitis, History of MRSA infection, History of recurrent UTIs, Hyperlipidemia, Hypertension, and Urgency of urination. She presents to the office today for concern of UTI. She reports that her symptoms started 2-3 days ago. Her symptoms include that of urinary frequency, urgency, burning and low back pain.   She denies any fevers, abdominal pain, nausea or vomiting.   She has frequent UTI's but has not been seen by Urology - she plans on making an appointment to see her Urologist.    Review of Systems See HPI   Past Medical History:  Diagnosis Date  . Chronic constipation   . Depression   . Diabetes mellitus type II   . DJD (degenerative joint disease)    knees  . Female cystocele   . GERD (gastroesophageal reflux disease)   . History of esophagitis   . History of MRSA infection    recurrent carbuncle  . History of recurrent UTIs   . Hyperlipidemia   . Hypertension   . Urgency of urination     Social History   Socioeconomic History  . Marital status: Single    Spouse name: Not on file  . Number of children: Not on file  . Years of education: Not on file  . Highest education level: Not on file  Occupational History  . Not on file  Social Needs  . Financial resource strain: Not on file  . Food insecurity:    Worry: Not on file    Inability: Not on file  . Transportation needs:    Medical: Not on file    Non-medical: Not on file  Tobacco Use  . Smoking status: Former Smoker    Years: 1.00    Types: Cigarettes    Last attempt to quit: 02/28/1996    Years since quitting: 22.4  . Smokeless tobacco: Never Used  Substance and Sexual Activity  .  Alcohol use: Yes    Alcohol/week: 0.0 standard drinks    Comment: occasional  . Drug use: No  . Sexual activity: Not on file  Lifestyle  . Physical activity:    Days per week: Not on file    Minutes per session: Not on file  . Stress: Not on file  Relationships  . Social connections:    Talks on phone: Not on file    Gets together: Not on file    Attends religious service: Not on file    Active member of club or organization: Not on file    Attends meetings of clubs or organizations: Not on file    Relationship status: Not on file  . Intimate partner violence:    Fear of current or ex partner: Not on file    Emotionally abused: Not on file    Physically abused: Not on file    Forced sexual activity: Not on file  Other Topics Concern  . Not on file  Social History Narrative   Single   Former Smoker  -  quit 10 to 11 years ago (light smoker)   Alcohol use-yes     2 children    Occupation: [employer]     Past Surgical History:  Procedure Laterality Date  . APPENDECTOMY  1980's  . BREAST EXCISIONAL BIOPSY Left 1989  . COLONOSCOPY  last one 06-18-2015  . INGUINAL HERNIA REPAIR Left 05-10-2001  . VAGINAL HYSTERECTOMY  1980's  . VAGINAL PROLAPSE REPAIR N/A 03/02/2016   Procedure: COLOPLAST ANTERIOR  VAULT REPAIR WITH AXIS DERMIS. SACROSPINUS FIXATION, AUGMENTATION WITH AXIS DERMIS;  Surgeon: Carolan Clines, MD;  Location: [facility];  Service: Urology;  Laterality: N/A;    Family History  Problem Relation Age of Onset  . Aneurysm Mother 23       deceased secondary to brain aneurysm  . Liver cancer Father 81       deceased  . Diabetes Unknown        grandmother  . Colon cancer Neg Hx   . Stomach cancer Neg Hx   . Rectal cancer Neg Hx   . Esophageal cancer Neg Hx     Allergies  Allergen Reactions  . Penicillins Hives    Has patient had a PCN reaction causing immediate rash, facial/tongue/throat swelling, SOB or lightheadedness  with hypotension: Yes Has patient had a PCN reaction causing severe rash involving mucus membranes or skin necrosis: No Has patient had a PCN reaction that required hospitalization: No Has patient had a PCN reaction occurring within the last 10 years: No If all of the above answers are "NO", then may proceed with Cephalosporin use.     Current Outpatient Medications on File Prior to Visit  Medication Sig Dispense Refill  . aspirin 81 MG tablet Take 81 mg by mouth daily.      . Calcium Carbonate-Vitamin D (CALTRATE 600+D) 600-400 MG-UNIT per tablet Take 1 tablet by mouth daily.      [name] CRANBERRY PO Take 2 tablets by mouth daily.     . cyclobenzaprine (FLEXERIL) 5 MG tablet take 1 tablet by mouth three times a day if needed for muscle spasm 30 tablet 1  . fluticasone (FLONASE) 50 MCG/ACT nasal spray Place 2 sprays into both nostrils daily. 16 g 6  . gabapentin (NEURONTIN) 100 MG capsule Take 1 capsule (100 mg total) by mouth 3 (three) times daily. 90 capsule 3  . glucose blood (ONETOUCH VERIO) test strip USE TO TEST BLOOD GLUCOSE TWICE DAILY. 200 each 3  . lisinopril (PRINIVIL,ZESTRIL) 10 MG tablet Take 1 tablet (10 mg total) by mouth daily. 90 tablet 1  . loratadine (CLARITIN) 10 MG tablet Take 10 mg by mouth daily.    . Misc Natural Products (TART CHERRY ADVANCED) CAPS Take 1 capsule by mouth daily.    . naproxen (NAPROSYN) 500 MG tablet Take 1 tablet (500 mg total) by mouth 2 (two) times daily as needed. Only as needed for pain. 180 tablet 1  . ONETOUCH DELICA LANCETS FINE MISC USE TO CHECK BLOOD SUGAR ONCE DAILY 100 each 3  . polyethylene glycol (MIRALAX / GLYCOLAX) packet Take 17 g by mouth daily as needed for mild constipation.    . simvastatin (ZOCOR) 40 MG tablet TAKE 1 TABLET BY MOUTH ONCE DAILY AT 6PM 90 tablet 0  . Tetrahydrozoline HCl (VISINE OP) Apply 1 drop to eye daily as needed (RED EYE).    . TURMERIC PO Take 1 tablet by mouth daily.     [name] tolterodine (DETROL LA) 2 MG 24 hr  capsule Take 1 capsule (2 mg total) by mouth daily. (Patient not taking: Reported on 07/21/2018) 30 capsule 2  . [DISCONTINUED] valsartan (DIOVAN) 160 MG tablet Take 1/2 tablet by mouth  once daily 30 tablet 5   No current facility-administered medications on file prior to visit.     BP 120/70   Temp 98.1 F (36.7 C) (Oral)   Wt 153 lb (69.4 kg)   BMI 26.26 kg/m       Objective:   Physical Exam  Constitutional: She is oriented to person, place, and time. She appears well-developed and well-nourished. No distress.  Cardiovascular: Normal rate, regular rhythm, normal heart sounds and intact distal pulses.  No murmur heard. Pulmonary/Chest: Effort normal and breath sounds normal.  Abdominal: Soft. Bowel sounds are normal. She exhibits no distension and no mass. There is no tenderness. There is no rebound, no guarding and no CVA tenderness. No hernia.  Neurological: She is alert and oriented to person, place, and time.  Skin: Skin is warm and dry. She is not diaphoretic.  Psychiatric: She has a normal mood and affect. Her behavior is normal. Judgment and thought content normal.  Nursing note and vitals reviewed.     Assessment & Plan:  1. Urinary frequency - POCT Urinalysis Dipstick (Automated)- + blood and [place]. Will treat and send culture. She was advised to follow up with Urology since she is getting UTI's frequently  - Urine Culture - sulfamethoxazole-trimethoprim (BACTRIM DS,SEPTRA DS) 800-160 MG tablet; Take 1 tablet by mouth 2 (two) times daily.  Dispense: 6 tablet; Refill: 0  [name], NP

## 2018-07-23 LAB — URINE CULTURE
MICRO NUMBER: 91035772
SPECIMEN QUALITY:: ADEQUATE

## 2018-07-26 ENCOUNTER — Other Ambulatory Visit: Payer: Self-pay | Admitting: Adult Health

## 2018-07-26 MED ORDER — CIPROFLOXACIN HCL 500 MG PO TABS: 500.0000 mg | ORAL_TABLET | Freq: Two times a day (BID) | ORAL | 0 refills | Status: DC

## 2018-07-31 ENCOUNTER — Other Ambulatory Visit: Payer: Self-pay | Admitting: Adult Health

## 2018-08-02 ENCOUNTER — Other Ambulatory Visit: Payer: Self-pay | Admitting: Adult Health

## 2018-08-02 NOTE — Telephone Encounter (Signed)
A 30 day supply has been sent to the pharmacy. Pt now due for cpx.

## 2018-08-03 NOTE — Telephone Encounter (Signed)
Reques from pharmacy is for 90 days.  Filled for 30 as the pt is past due for cpx.

## 2018-08-08 DIAGNOSIS — M62838 Other muscle spasm: Secondary | ICD-10-CM | POA: Diagnosis not present

## 2018-08-08 DIAGNOSIS — M6289 Other specified disorders of muscle: Secondary | ICD-10-CM | POA: Diagnosis not present

## 2018-08-08 DIAGNOSIS — K5900 Constipation, unspecified: Secondary | ICD-10-CM | POA: Diagnosis not present

## 2018-08-08 DIAGNOSIS — R351 Nocturia: Secondary | ICD-10-CM | POA: Diagnosis not present

## 2018-08-08 DIAGNOSIS — R3914 Feeling of incomplete bladder emptying: Secondary | ICD-10-CM | POA: Diagnosis not present

## 2018-08-08 DIAGNOSIS — M6281 Muscle weakness (generalized): Secondary | ICD-10-CM | POA: Diagnosis not present

## 2018-08-10 ENCOUNTER — Other Ambulatory Visit: Payer: Self-pay | Admitting: Adult Health

## 2018-08-10 NOTE — Telephone Encounter (Signed)
Sent to the pharmacy by e-scribe. 

## 2018-08-19 DIAGNOSIS — M6289 Other specified disorders of muscle: Secondary | ICD-10-CM | POA: Diagnosis not present

## 2018-08-19 DIAGNOSIS — K5900 Constipation, unspecified: Secondary | ICD-10-CM | POA: Diagnosis not present

## 2018-08-19 DIAGNOSIS — R351 Nocturia: Secondary | ICD-10-CM | POA: Diagnosis not present

## 2018-08-19 DIAGNOSIS — M62838 Other muscle spasm: Secondary | ICD-10-CM | POA: Diagnosis not present

## 2018-08-19 DIAGNOSIS — M6281 Muscle weakness (generalized): Secondary | ICD-10-CM | POA: Diagnosis not present

## 2018-08-19 DIAGNOSIS — R3914 Feeling of incomplete bladder emptying: Secondary | ICD-10-CM | POA: Diagnosis not present

## 2018-08-23 DIAGNOSIS — Z0100 Encounter for examination of eyes and vision without abnormal findings: Secondary | ICD-10-CM | POA: Diagnosis not present

## 2018-08-29 DIAGNOSIS — N952 Postmenopausal atrophic vaginitis: Secondary | ICD-10-CM | POA: Diagnosis not present

## 2018-08-29 DIAGNOSIS — N8111 Cystocele, midline: Secondary | ICD-10-CM | POA: Diagnosis not present

## 2018-08-29 DIAGNOSIS — R3914 Feeling of incomplete bladder emptying: Secondary | ICD-10-CM | POA: Diagnosis not present

## 2018-08-29 DIAGNOSIS — Z87440 Personal history of urinary (tract) infections: Secondary | ICD-10-CM | POA: Diagnosis not present

## 2018-09-05 ENCOUNTER — Ambulatory Visit: Payer: Self-pay | Admitting: *Deleted

## 2018-09-05 NOTE — Telephone Encounter (Signed)
Pt called with complaints of numbness and tingling in all of her fingers on both hands; she says that this started about 2 weeks ago but she has had it in the past; recommendations made per nurse triage protocol; spoke with [facility] in regards to scheduling appointment; pt offered and accepted appointment with [name], [facility], 09/06/18 at 1500; she verbalized understanding; will route to office for notification of this upcoming appointment. Reason for Disposition . [1] Numbness or tingling on both sides of body AND [2] is a new symptom present > 24 hours  Answer Assessment - Initial Assessment Questions 1. SYMPTOM: "What is the main symptom you are concerned about?" (e.g., weakness, numbness)     Numbness and tingling 2. ONSET: "When did this start?" (minutes, hours, days; while sleeping)    2 weeks ago; intermittent 3. LAST NORMAL: "When was the last time you were normal (no symptoms)?"     2 weeks ago 4. PATTERN "Does this come and go, or has it been constant since it started?"  "Is it present now?"     Comes and goes; worse when sleeping 5. CARDIAC SYMPTOMS: "Have you had any of the following symptoms: chest pain, difficulty breathing, palpitations?"     no 6. NEUROLOGIC SYMPTOMS: "Have you had any of the following symptoms: headache, dizziness, vision loss, double vision, changes in speech, unsteady on your feet?"     no 7. OTHER SYMPTOMS: "Do you have any other symptoms?"     no 8. PREGNANCY: "Is there any chance you are pregnant?" "When was your last menstrual period?"     no  Protocols used: NEUROLOGIC DEFICIT-A-AH

## 2018-09-06 ENCOUNTER — Ambulatory Visit (INDEPENDENT_AMBULATORY_CARE_PROVIDER_SITE_OTHER): Payer: Medicare HMO | Admitting: Adult Health

## 2018-09-06 ENCOUNTER — Encounter: Payer: Self-pay | Admitting: Adult Health

## 2018-09-06 ENCOUNTER — Ambulatory Visit (INDEPENDENT_AMBULATORY_CARE_PROVIDER_SITE_OTHER): Payer: Medicare HMO

## 2018-09-06 ENCOUNTER — Encounter: Payer: Self-pay | Admitting: Family Medicine

## 2018-09-06 VITALS — BP 122/78 | Temp 97.8°F | Wt 156.0 lb

## 2018-09-06 DIAGNOSIS — M5412 Radiculopathy, cervical region: Secondary | ICD-10-CM

## 2018-09-06 DIAGNOSIS — R350 Frequency of micturition: Secondary | ICD-10-CM

## 2018-09-06 DIAGNOSIS — M47812 Spondylosis without myelopathy or radiculopathy, cervical region: Secondary | ICD-10-CM | POA: Diagnosis not present

## 2018-09-06 DIAGNOSIS — M50322 Other cervical disc degeneration at C5-C6 level: Secondary | ICD-10-CM | POA: Diagnosis not present

## 2018-09-06 LAB — POC URINALSYSI DIPSTICK (AUTOMATED)
Blood, UA: NEGATIVE
GLUCOSE UA: NEGATIVE
Ketones, UA: NEGATIVE
LEUKOCYTES UA: NEGATIVE
Nitrite, UA: NEGATIVE
PH UA: 7 (ref 5.0–8.0)
PROTEIN UA: NEGATIVE
SPEC GRAV UA: 1.015 (ref 1.010–1.025)
UROBILINOGEN UA: 0.2 U/dL

## 2018-09-06 MED ORDER — METHYLPREDNISOLONE 4 MG PO TBPK: ORAL_TABLET | ORAL | 0 refills | Status: DC

## 2018-09-06 NOTE — Progress Notes (Signed)
Subjective:    Patient ID: Cathy [name], female    DOB: 1946/11/23, 72 y.o.   MRN: [id number]  HPI 72 year old female who  has a past medical history of Chronic constipation, Depression, Diabetes mellitus type II, DJD (degenerative joint disease), Female cystocele, GERD (gastroesophageal reflux disease), History of esophagitis, History of MRSA infection, History of recurrent UTIs, Hyperlipidemia, Hypertension, and Urgency of urination.  She presents to the office today for the complaint of numbness and tingling in bilateral fingers. She reports that her symptoms are present in all digits. Numbness and tingling has become worse over the last few weeks. Worse in the morning and at night. Denies any numbness in tingling in arms. Does not feel as though she has trouble with grip strength  She would also liked to be checked for UTI - no symptoms   Review of Systems See HPI   Past Medical History:  Diagnosis Date  . Chronic constipation   . Depression   . Diabetes mellitus type II   . DJD (degenerative joint disease)    knees  . Female cystocele   . GERD (gastroesophageal reflux disease)   . History of esophagitis   . History of MRSA infection    recurrent carbuncle  . History of recurrent UTIs   . Hyperlipidemia   . Hypertension   . Urgency of urination     Social History   Socioeconomic History  . Marital status: Single    Spouse name: Not on file  . Number of children: Not on file  . Years of education: Not on file  . Highest education level: Not on file  Occupational History  . Not on file  Social Needs  . Financial resource strain: Not on file  . Food insecurity:    Worry: Not on file    Inability: Not on file  . Transportation needs:    Medical: Not on file    Non-medical: Not on file  Tobacco Use  . Smoking status: Former Smoker    Years: 1.00    Types: Cigarettes    Last attempt to quit: 02/28/1996    Years since quitting: 22.5  . Smokeless tobacco:  Never Used  Substance and Sexual Activity  . Alcohol use: Yes    Alcohol/week: 0.0 standard drinks    Comment: occasional  . Drug use: No  . Sexual activity: Not on file  Lifestyle  . Physical activity:    Days per week: Not on file    Minutes per session: Not on file  . Stress: Not on file  Relationships  . Social connections:    Talks on phone: Not on file    Gets together: Not on file    Attends religious service: Not on file    Active member of club or organization: Not on file    Attends meetings of clubs or organizations: Not on file    Relationship status: Not on file  . Intimate partner violence:    Fear of current or ex partner: Not on file    Emotionally abused: Not on file    Physically abused: Not on file    Forced sexual activity: Not on file  Other Topics Concern  . Not on file  Social History Narrative   Single   Former Smoker  -  quit 10 to 11 years ago (light smoker)   Alcohol use-yes     2 children    Occupation: [employer]  Past Surgical History:  Procedure Laterality Date  . APPENDECTOMY  1980's  . BREAST EXCISIONAL BIOPSY Left 1989  . COLONOSCOPY  last one 06-18-2015  . INGUINAL HERNIA REPAIR Left 05-10-2001  . VAGINAL HYSTERECTOMY  1980's  . VAGINAL PROLAPSE REPAIR N/A 03/02/2016   Procedure: COLOPLAST ANTERIOR  VAULT REPAIR WITH AXIS DERMIS. SACROSPINUS FIXATION, AUGMENTATION WITH AXIS DERMIS;  Surgeon: Carolan Clines, MD;  Location: [facility];  Service: Urology;  Laterality: N/A;    Family History  Problem Relation Age of Onset  . Aneurysm Mother 79       deceased secondary to brain aneurysm  . Liver cancer Father 45       deceased  . Diabetes Unknown        grandmother  . Colon cancer Neg Hx   . Stomach cancer Neg Hx   . Rectal cancer Neg Hx   . Esophageal cancer Neg Hx     Allergies  Allergen Reactions  . Penicillins Hives    Has patient had a PCN reaction causing immediate rash,  facial/tongue/throat swelling, SOB or lightheadedness with hypotension: Yes Has patient had a PCN reaction causing severe rash involving mucus membranes or skin necrosis: No Has patient had a PCN reaction that required hospitalization: No Has patient had a PCN reaction occurring within the last 10 years: No If all of the above answers are "NO", then may proceed with Cephalosporin use.     Current Outpatient Medications on File Prior to Visit  Medication Sig Dispense Refill  . aspirin 81 MG tablet Take 81 mg by mouth daily.      . Calcium Carbonate-Vitamin D (CALTRATE 600+D) 600-400 MG-UNIT per tablet Take 1 tablet by mouth daily.      . ciprofloxacin (CIPRO) 500 MG tablet Take 1 tablet (500 mg total) by mouth 2 (two) times daily. 6 tablet 0  . CRANBERRY PO Take 2 tablets by mouth daily.     . cyclobenzaprine (FLEXERIL) 5 MG tablet take 1 tablet by mouth three times a day if needed for muscle spasm 30 tablet 1  . fluticasone (FLONASE) 50 MCG/ACT nasal spray Place 2 sprays into both nostrils daily. 16 g 6  . gabapentin (NEURONTIN) 100 MG capsule Take 1 capsule (100 mg total) by mouth 3 (three) times daily. 90 capsule 3  . glucose blood (ONETOUCH VERIO) test strip USE TO TEST BLOOD GLUCOSE TWICE DAILY. 200 each 3  . lisinopril (PRINIVIL,ZESTRIL) 10 MG tablet Take 1 tablet (10 mg total) by mouth daily. **DUE FOR PHYSICAL WITH Alexsis Kathman** 30 tablet 0  . loratadine (CLARITIN) 10 MG tablet Take 10 mg by mouth daily.    . Misc Natural Products (TART CHERRY ADVANCED) CAPS Take 1 capsule by mouth daily.    Glory Rosebush DELICA LANCETS FINE MISC USE TO CHECK BLOOD SUGAR ONCE DAILY 100 each 3  . polyethylene glycol (MIRALAX / GLYCOLAX) packet Take 17 g by mouth daily as needed for mild constipation.    . simvastatin (ZOCOR) 40 MG tablet TAKE 1 TABLET BY MOUTH ONCE DAILY AT 6PM 90 tablet 3  . sulfamethoxazole-trimethoprim (BACTRIM DS,SEPTRA DS) 800-160 MG tablet Take 1 tablet by mouth 2 (two) times daily. 6  tablet 0  . Tetrahydrozoline HCl (VISINE OP) Apply 1 drop to eye daily as needed (RED EYE).    [name] tolterodine (DETROL LA) 2 MG 24 hr capsule Take 1 capsule (2 mg total) by mouth daily. 30 capsule 2  . TURMERIC PO Take 1 tablet by mouth  daily.     . estradiol (ESTRACE) 0.1 MG/GM vaginal cream U 0.5 GRAM VAGINALLY HS  3  . [DISCONTINUED] valsartan (DIOVAN) 160 MG tablet Take 1/2 tablet by mouth once daily 30 tablet 5   No current facility-administered medications on file prior to visit.     BP (!) 160/80   Temp 97.8 F (36.6 C) (Oral)   Wt 156 lb (70.8 kg)   BMI 26.78 kg/m       Objective:   Physical Exam  Constitutional: She is oriented to person, place, and time. She appears well-developed and well-nourished. No distress.  Cardiovascular: Normal rate, regular rhythm, normal heart sounds and intact distal pulses.  Musculoskeletal: Normal range of motion.  Neurological: She is alert and oriented to person, place, and time. She displays normal reflexes. No cranial nerve deficit or sensory deficit. She exhibits normal muscle tone. Coordination normal.  5/5 grip strength in bilateral hands.  Negative Phalen and Tinels   Skin: Skin is warm and dry. She is not diaphoretic.  Psychiatric: She has a normal mood and affect. Her behavior is normal. Judgment and thought content normal.  Nursing note and vitals reviewed.     Assessment & Plan:  1. Cervical radiculopathy - Doubt carpal Tunnel. Will start off with xray and medrol dose pack. Advised to monitor blood sugar and drink more water.  - DG Cervical Spine Complete; Future - methylPREDNISolone (MEDROL DOSEPAK) 4 MG TBPK tablet; Take as directed  Dispense: 21 tablet; Refill: 0 - Consider MRI and ortho consult  2. Urinary frequency  - POCT Urinalysis Dipstick (Automated)  [name], NP

## 2018-09-08 ENCOUNTER — Telehealth: Payer: Self-pay | Admitting: *Deleted

## 2018-09-08 DIAGNOSIS — M79673 Pain in unspecified foot: Secondary | ICD-10-CM

## 2018-09-08 DIAGNOSIS — M25579 Pain in unspecified ankle and joints of unspecified foot: Secondary | ICD-10-CM

## 2018-09-08 DIAGNOSIS — M67879 Other specified disorders of synovium and tendon, unspecified ankle and foot: Secondary | ICD-10-CM

## 2018-09-08 NOTE — Telephone Encounter (Signed)
Copied from [place] [id number]. Topic: Referral - Request for Referral >> Sep 08, 2018  4:23 PM Judyann Munson wrote: Has patient seen PCP for this complaint? Yes, she stated she spoke with [name] about this on last appt visit  Referral for which specialty: Foot and Ankle  Preferred provider/office: Dr.Tom - [facility]. [phone] Reason for referral: Ankle pain

## 2018-09-08 NOTE — Telephone Encounter (Signed)
Okay to refer? 

## 2018-09-09 ENCOUNTER — Other Ambulatory Visit: Payer: Self-pay | Admitting: Family Medicine

## 2018-09-09 DIAGNOSIS — M47812 Spondylosis without myelopathy or radiculopathy, cervical region: Secondary | ICD-10-CM

## 2018-09-09 NOTE — Telephone Encounter (Signed)
Ok for referral?

## 2018-09-09 NOTE — Telephone Encounter (Signed)
Referral placed.

## 2018-09-18 ENCOUNTER — Other Ambulatory Visit: Payer: Self-pay | Admitting: Adult Health

## 2018-09-19 DIAGNOSIS — M7732 Calcaneal spur, left foot: Secondary | ICD-10-CM | POA: Diagnosis not present

## 2018-09-19 DIAGNOSIS — M7662 Achilles tendinitis, left leg: Secondary | ICD-10-CM | POA: Diagnosis not present

## 2018-09-20 NOTE — Telephone Encounter (Signed)
Sent to the pharmacy by e-scribe. 

## 2018-09-26 DIAGNOSIS — M7662 Achilles tendinitis, left leg: Secondary | ICD-10-CM | POA: Diagnosis not present

## 2018-10-03 DIAGNOSIS — M19072 Primary osteoarthritis, left ankle and foot: Secondary | ICD-10-CM | POA: Diagnosis not present

## 2018-10-03 DIAGNOSIS — M7662 Achilles tendinitis, left leg: Secondary | ICD-10-CM | POA: Diagnosis not present

## 2018-10-03 NOTE — Progress Notes (Signed)
[name] [address] [place], [place] [address] Phone: [phone] Subjective:   Cathy [name], am serving as a scribe for Dr. Hulan Saas.   CC: Bilateral knee pain  [id number]  Cathy [name] is a 72 y.o. female coming in with complaint of knee pain. Did have some relief from monovisc given in August. Is in constant pain. Did fall on left side yesterday in her kitchen.  Patient has knee arthritis.  Severe bilaterally.    Past Medical History:  Diagnosis Date  . Chronic constipation   . Depression   . Diabetes mellitus type II   . DJD (degenerative joint disease)    knees  . Female cystocele   . GERD (gastroesophageal reflux disease)   . History of esophagitis   . History of MRSA infection    recurrent carbuncle  . History of recurrent UTIs   . Hyperlipidemia   . Hypertension   . Urgency of urination    Past Surgical History:  Procedure Laterality Date  . APPENDECTOMY  1980's  . BREAST EXCISIONAL BIOPSY Left 1989  . COLONOSCOPY  last one 06-18-2015  . INGUINAL HERNIA REPAIR Left 05-10-2001  . VAGINAL HYSTERECTOMY  1980's  . VAGINAL PROLAPSE REPAIR N/A 03/02/2016   Procedure: COLOPLAST ANTERIOR  VAULT REPAIR WITH AXIS DERMIS. SACROSPINUS FIXATION, AUGMENTATION WITH AXIS DERMIS;  Surgeon: Carolan Clines, MD;  Location: [facility];  Service: Urology;  Laterality: N/A;   Social History   Socioeconomic History  . Marital status: Single    Spouse name: Not on file  . Number of children: Not on file  . Years of education: Not on file  . Highest education level: Not on file  Occupational History  . Not on file  Social Needs  . Financial resource strain: Not on file  . Food insecurity:    Worry: Not on file    Inability: Not on file  . Transportation needs:    Medical: Not on file    Non-medical: Not on file  Tobacco Use  . Smoking status: Former Smoker    Years: 1.00    Types: Cigarettes    Last  attempt to quit: 02/28/1996    Years since quitting: 22.6  . Smokeless tobacco: Never Used  Substance and Sexual Activity  . Alcohol use: Yes    Alcohol/week: 0.0 standard drinks    Comment: occasional  . Drug use: [name]  . Sexual activity: Not on file  Lifestyle  . Physical activity:    Days per week: Not on file    Minutes per session: Not on file  . Stress: Not on file  Relationships  . Social connections:    Talks on phone: Not on file    Gets together: Not on file    Attends religious service: Not on file    Active member of club or organization: Not on file    Attends meetings of clubs or organizations: Not on file    Relationship status: Not on file  Other Topics Concern  . Not on file  Social History Narrative   Single   Former Smoker  -  quit 10 to 11 years ago (light smoker)   Alcohol use-yes     2 children    Occupation: [employer]    Allergies  Allergen Reactions  . Penicillins Hives    Has patient had a PCN reaction causing immediate rash, facial/tongue/throat swelling, SOB or lightheadedness with  hypotension: Yes Has patient had a PCN reaction causing severe rash involving mucus membranes or skin necrosis: [name] Has patient had a PCN reaction that required hospitalization: [name] Has patient had a PCN reaction occurring within the last 10 years: [name] If all of the above answers are "[name]", then may proceed with Cephalosporin use.    Family History  Problem Relation Age of Onset  . Aneurysm Mother 10       deceased secondary to brain aneurysm  . Liver cancer Father 53       deceased  . Diabetes Unknown        grandmother  . Colon cancer Neg Hx   . Stomach cancer Neg Hx   . Rectal cancer Neg Hx   . Esophageal cancer Neg Hx     Current Outpatient Medications (Endocrine & Metabolic):  .  methylPREDNISolone (MEDROL DOSEPAK) 4 MG TBPK tablet, Take as directed  Current Outpatient Medications (Cardiovascular):  .  lisinopril (PRINIVIL,ZESTRIL) 10 MG  tablet, TAKE 1 TABLET(10 MG) BY MOUTH DAILY .  simvastatin (ZOCOR) 40 MG tablet, TAKE 1 TABLET BY MOUTH ONCE DAILY AT 6PM  Current Outpatient Medications (Respiratory):  .  fluticasone (FLONASE) 50 MCG/ACT nasal spray, Place 2 sprays into both nostrils daily. [name]  loratadine (CLARITIN) 10 MG tablet, Take 10 mg by mouth daily.  Current Outpatient Medications (Analgesics):  .  aspirin 81 MG tablet, Take 81 mg by mouth daily.     Current Outpatient Medications (Other):  [name]  Calcium Carbonate-Vitamin D (CALTRATE 600+D) 600-400 MG-UNIT per tablet, Take 1 tablet by mouth daily.   .  ciprofloxacin (CIPRO) 500 MG tablet, Take 1 tablet (500 mg total) by mouth 2 (two) times daily. [name]  CRANBERRY PO, Take 2 tablets by mouth daily.  .  cyclobenzaprine (FLEXERIL) 5 MG tablet, take 1 tablet by mouth three times a day if needed for muscle spasm .  estradiol (ESTRACE) 0.1 MG/GM vaginal cream, U 0.5 GRAM VAGINALLY HS .  gabapentin (NEURONTIN) 100 MG capsule, Take 1 capsule (100 mg total) by mouth 3 (three) times daily. [name]  glucose blood (ONETOUCH VERIO) test strip, USE TO TEST BLOOD GLUCOSE TWICE DAILY. [name]  Misc Natural Products (TART CHERRY ADVANCED) CAPS, Take 1 capsule by mouth daily. Glory Rosebush DELICA LANCETS FINE MISC, USE TO CHECK BLOOD SUGAR ONCE DAILY .  polyethylene glycol (MIRALAX / GLYCOLAX) packet, Take 17 g by mouth daily as needed for mild constipation. .  sulfamethoxazole-trimethoprim (BACTRIM DS,SEPTRA DS) 800-160 MG tablet, Take 1 tablet by mouth 2 (two) times daily. .  Tetrahydrozoline HCl (VISINE OP), Apply 1 drop to eye daily as needed (RED EYE). [name]  tolterodine (DETROL LA) 2 MG 24 hr capsule, Take 1 capsule (2 mg total) by mouth daily. .  TURMERIC PO, Take 1 tablet by mouth daily.     Past medical history, social, surgical and family history all reviewed in electronic medical record.  [name] pertanent information unless stated regarding to the chief complaint.   Review of Systems:  [name]  headache, visual changes, nausea, vomiting, diarrhea, constipation, dizziness, abdominal pain, skin rash, fevers, chills, night sweats, weight loss, swollen lymph nodes, body aches, joint swelling, chest pain, shortness of breath, mood changes.  Positive muscle aches  Objective  Blood pressure 116/74, pulse 81, height 5\' 4"  (1.626 m), weight 154 lb (69.9 kg), SpO2 98 %.    General: [name] apparent distress alert and oriented x3 mood and affect normal, dressed appropriately.  HEENT: Pupils equal, extraocular movements  intact  Respiratory: Patient's speak in full sentences and does not appear short of breath  Cardiovascular: [name] lower extremity edema, non tender, [name] erythema  Skin: Warm dry intact with [name] signs of infection or rash on extremities or on axial skeleton.  Abdomen: Soft nontender  Neuro: Cranial nerves II through XII are intact, neurovascularly intact in all extremities with 2+ DTRs and 2+ pulses.  Lymph: [name] lymphadenopathy of posterior or anterior cervical chain or axillae bilaterally.  Gait antalgic MSK:  Non tender with full range of motion and good stability and symmetric strength and tone of shoulders, elbows, wrist, hip, and ankles bilaterally.  Mild arthritic changes of the knees bilaterally.  Knee: Bilateral valgus deformity noted. Large thigh to calf ratio.  Tender to palpation over medial and PF joint line.  ROM full in flexion and extension and lower leg rotation. instability with valgus force.  painful patellar compression. Patellar glide with moderate crepitus. Patellar and quadriceps tendons unremarkable. Hamstring and quadriceps strength is normal.  After informed written and verbal consent, patient was seated on exam table. Right knee was prepped with alcohol swab and utilizing anterolateral approach, patient's right knee space was injected with 4:1  marcaine 0.5%: Kenalog 40mg /dL. Patient tolerated the procedure well without immediate complications.  After informed  written and verbal consent, patient was seated on exam table. Left knee was prepped with alcohol swab and utilizing anterolateral approach, patient's left knee space was injected with 4:1  marcaine 0.5%: Kenalog 40mg /dL. Patient tolerated the procedure well without immediate complications.       Impression and Recommendations:     This case required medical decision making of moderate complexity. The above documentation has been reviewed and is accurate and complete [name], DO       Note: This dictation was prepared with Dragon dictation along with smaller phrase technology. Any transcriptional errors that result from this process are unintentional.

## 2018-10-04 ENCOUNTER — Encounter: Payer: Self-pay | Admitting: Family Medicine

## 2018-10-04 ENCOUNTER — Ambulatory Visit (INDEPENDENT_AMBULATORY_CARE_PROVIDER_SITE_OTHER): Payer: Medicare HMO | Admitting: Family Medicine

## 2018-10-04 DIAGNOSIS — M170 Bilateral primary osteoarthritis of knee: Secondary | ICD-10-CM | POA: Diagnosis not present

## 2018-10-04 NOTE — Patient Instructions (Signed)
Good to see you  Ice  Is your friend I hope you do well  You know the drill  See me again in 3 months

## 2018-10-04 NOTE — Assessment & Plan Note (Signed)
Worsening pain.  Bilateral injections given today, discussed icing regimen and home exercise.  Discussed which activities to do which was to avoid.  Patient is turning 74 weeks.  Will be very active she states.  Topical anti-inflammatories given.  Follow-up again in 3 months  Spent  25 minutes with patient face-to-face and had greater than 50% of counseling including as described above in assessment and plan.

## 2018-10-10 DIAGNOSIS — M7662 Achilles tendinitis, left leg: Secondary | ICD-10-CM | POA: Diagnosis not present

## 2018-10-13 ENCOUNTER — Ambulatory Visit (INDEPENDENT_AMBULATORY_CARE_PROVIDER_SITE_OTHER): Payer: Medicare HMO | Admitting: Family Medicine

## 2018-10-13 ENCOUNTER — Encounter: Payer: Self-pay | Admitting: Family Medicine

## 2018-10-13 VITALS — BP 118/86 | HR 86 | Temp 97.8°F

## 2018-10-13 DIAGNOSIS — R350 Frequency of micturition: Secondary | ICD-10-CM

## 2018-10-13 DIAGNOSIS — N3001 Acute cystitis with hematuria: Secondary | ICD-10-CM

## 2018-10-13 LAB — POC URINALSYSI DIPSTICK (AUTOMATED)
Bilirubin, UA: NEGATIVE
Glucose, UA: NEGATIVE
Ketones, UA: NEGATIVE
NITRITE UA: POSITIVE
PH UA: 6 (ref 5.0–8.0)
Protein, UA: POSITIVE — AB
Spec Grav, UA: 1.02 (ref 1.010–1.025)
UROBILINOGEN UA: 0.2 U/dL

## 2018-10-13 MED ORDER — SULFAMETHOXAZOLE-TRIMETHOPRIM 800-160 MG PO TABS: 1.0000 | ORAL_TABLET | Freq: Two times a day (BID) | ORAL | 0 refills | Status: AC

## 2018-10-13 NOTE — Progress Notes (Signed)
Subjective:    Patient ID: Cathy [name], female    DOB: [birth year], 72 y.o.   MRN: [id number]  No chief complaint on file.   HPI Patient was seen today for acute concern.  Urinary concern: -endorses frequency, cloudy appearance, odor, and low back pain  -x a few days -has had as much water as she normally does -has not taken anything for symptoms -denies fever, chills, N/V, d/c -notes h/o prolapse.  Followed by Dr. Gaynelle Arabian.  Past Medical History:  Diagnosis Date  . Chronic constipation   . Depression   . Diabetes mellitus type II   . DJD (degenerative joint disease)    knees  . Female cystocele   . GERD (gastroesophageal reflux disease)   . History of esophagitis   . History of MRSA infection    recurrent carbuncle  . History of recurrent UTIs   . Hyperlipidemia   . Hypertension   . Urgency of urination     Allergies  Allergen Reactions  . Penicillins Hives    Has patient had a PCN reaction causing immediate rash, facial/tongue/throat swelling, SOB or lightheadedness with hypotension: Yes Has patient had a PCN reaction causing severe rash involving mucus membranes or skin necrosis: No Has patient had a PCN reaction that required hospitalization: No Has patient had a PCN reaction occurring within the last 10 years: No If all of the above answers are "NO", then may proceed with Cephalosporin use.     ROS General: Denies fever, chills, night sweats, changes in weight, changes in appetite HEENT: Denies headaches, ear pain, changes in vision, rhinorrhea, sore throat CV: Denies CP, palpitations, SOB, orthopnea Pulm: Denies SOB, cough, wheezing GI: Denies abdominal pain, nausea, vomiting, diarrhea, constipation GU: Denies dysuria, hematuria, vaginal discharge  +frequency, odor, cloudy appearance Msk: Denies muscle cramps, joint pains  + L Achilles tendon pain Neuro: Denies weakness, numbness, tingling Skin: Denies rashes, bruising Psych: Denies depression,  anxiety, hallucinations    Objective:    Blood pressure 118/86, pulse 86, temperature 97.8 F (36.6 C), temperature source Oral, SpO2 98 %.  Gen. Pleasant, well-nourished, in no distress, normal affect.  Ambulating with a CAM walker on L foot. Lungs: no accessory muscle use, CTAB, no wheezes or rales Cardiovascular: RRR, no m/r/g, no peripheral edema Abdomen: BS present, soft, mild tenderness to palpation in suprapubic area, ND, no CVA tenderness   Wt Readings from Last 3 Encounters:  10/04/18 154 lb (69.9 kg)  09/06/18 156 lb (70.8 kg)  07/21/18 153 lb (69.4 kg)    Lab Results  Component Value Date   WBC 7.5 07/13/2018   HGB 13.6 07/13/2018   HCT 42.4 07/13/2018   PLT 300.0 07/13/2018   GLUCOSE 87 07/13/2018   CHOL 191 07/13/2018   TRIG 85.0 07/13/2018   HDL 74.00 07/13/2018   LDLDIRECT 118.2 11/21/2013   LDLCALC 100 (H) 07/13/2018   ALT 17 07/13/2018   AST 17 07/13/2018   NA 137 07/13/2018   K 4.4 07/13/2018   CL 100 07/13/2018   CREATININE 0.98 07/13/2018   BUN 16 07/13/2018   CO2 29 07/13/2018   TSH 1.78 07/13/2018   HGBA1C 6.5 07/13/2018   MICROALBUR <0.7 07/13/2018    Assessment/Plan:  Acute cystitis with hematuria -We will send for UCx -Given handout -Encouraged to increase p.o. hydration -Follow-up with specialist as needed - Plan: sulfamethoxazole-trimethoprim (BACTRIM DS,SEPTRA DS) 800-160 MG tablet, urine culture  Urinary frequency -UA with SG 1.020, positive protein, 3+ leuks, 2+ RBCs - Plan:  POCT Urinalysis Dipstick (Automated)  Follow-up PRN  Grier Mitts, MD

## 2018-10-13 NOTE — Patient Instructions (Signed)

## 2018-10-15 LAB — URINE CULTURE
MICRO NUMBER:: 91405367
SPECIMEN QUALITY:: ADEQUATE

## 2018-10-18 ENCOUNTER — Ambulatory Visit (INDEPENDENT_AMBULATORY_CARE_PROVIDER_SITE_OTHER): Payer: Medicare HMO | Admitting: Adult Health

## 2018-10-18 ENCOUNTER — Encounter: Payer: Self-pay | Admitting: Adult Health

## 2018-10-18 ENCOUNTER — Ambulatory Visit (INDEPENDENT_AMBULATORY_CARE_PROVIDER_SITE_OTHER): Payer: Medicare HMO

## 2018-10-18 VITALS — BP 110/56 | Temp 97.3°F | Wt 151.0 lb

## 2018-10-18 DIAGNOSIS — R350 Frequency of micturition: Secondary | ICD-10-CM

## 2018-10-18 DIAGNOSIS — M25552 Pain in left hip: Secondary | ICD-10-CM | POA: Diagnosis not present

## 2018-10-18 DIAGNOSIS — S79912A Unspecified injury of left hip, initial encounter: Secondary | ICD-10-CM | POA: Diagnosis not present

## 2018-10-18 LAB — POCT URINALYSIS DIPSTICK
BILIRUBIN UA: NEGATIVE
GLUCOSE UA: NEGATIVE
Ketones, UA: NEGATIVE
Protein, UA: POSITIVE — AB
RBC UA: NEGATIVE
Spec Grav, UA: 1.02 (ref 1.010–1.025)
Urobilinogen, UA: 0.2 E.U./dL
pH, UA: 7.5 (ref 5.0–8.0)

## 2018-10-18 NOTE — Progress Notes (Signed)
Subjective:    Patient ID: Cathy [name], female    DOB: May 18, 1946, 72 y.o.   MRN: [id number]  HPI 72 year old female who  has a past medical history of Chronic constipation, Depression, Diabetes mellitus type II, DJD (degenerative joint disease), Female cystocele, GERD (gastroesophageal reflux disease), History of esophagitis, History of MRSA infection, History of recurrent UTIs, Hyperlipidemia, Hypertension, and Urgency of urination.  She presents to the office today for left hip pain x 9 days. She reports mechanical fall at home and landed on hard surface. She did not have a syncopal episode. Report pain in left hip that has been improving with time. Continues to walk with a limping gait and pain worse with ambulation.   No longer has a bruise   Additionally, she was treated 5 days ago for UTI with Bactrim. She still has two days of abx. Continues to have slight burning.   Review of Systems See HPI   Past Medical History:  Diagnosis Date  . Chronic constipation   . Depression   . Diabetes mellitus type II   . DJD (degenerative joint disease)    knees  . Female cystocele   . GERD (gastroesophageal reflux disease)   . History of esophagitis   . History of MRSA infection    recurrent carbuncle  . History of recurrent UTIs   . Hyperlipidemia   . Hypertension   . Urgency of urination     Social History   Socioeconomic History  . Marital status: Single    Spouse name: Not on file  . Number of children: Not on file  . Years of education: Not on file  . Highest education level: Not on file  Occupational History  . Not on file  Social Needs  . Financial resource strain: Not on file  . Food insecurity:    Worry: Not on file    Inability: Not on file  . Transportation needs:    Medical: Not on file    Non-medical: Not on file  Tobacco Use  . Smoking status: Former Smoker    Years: 1.00    Types: Cigarettes    Last attempt to quit: 02/28/1996    Years since  quitting: 22.6  . Smokeless tobacco: Never Used  Substance and Sexual Activity  . Alcohol use: Yes    Alcohol/week: 0.0 standard drinks    Comment: occasional  . Drug use: No  . Sexual activity: Not on file  Lifestyle  . Physical activity:    Days per week: Not on file    Minutes per session: Not on file  . Stress: Not on file  Relationships  . Social connections:    Talks on phone: Not on file    Gets together: Not on file    Attends religious service: Not on file    Active member of club or organization: Not on file    Attends meetings of clubs or organizations: Not on file    Relationship status: Not on file  . Intimate partner violence:    Fear of current or ex partner: Not on file    Emotionally abused: Not on file    Physically abused: Not on file    Forced sexual activity: Not on file  Other Topics Concern  . Not on file  Social History Narrative   Single   Former Smoker  -  quit 10 to 11 years ago (light smoker)   Alcohol use-yes     2  children    Occupation: [employer]     Past Surgical History:  Procedure Laterality Date  . APPENDECTOMY  1980's  . BREAST EXCISIONAL BIOPSY Left 1989  . COLONOSCOPY  last one 06-18-2015  . INGUINAL HERNIA REPAIR Left 05-10-2001  . VAGINAL HYSTERECTOMY  1980's  . VAGINAL PROLAPSE REPAIR N/A 03/02/2016   Procedure: COLOPLAST ANTERIOR  VAULT REPAIR WITH AXIS DERMIS. SACROSPINUS FIXATION, AUGMENTATION WITH AXIS DERMIS;  Surgeon: Carolan Clines, MD;  Location: [facility];  Service: Urology;  Laterality: N/A;    Family History  Problem Relation Age of Onset  . Aneurysm Mother 48       deceased secondary to brain aneurysm  . Liver cancer Father 86       deceased  . Diabetes Unknown        grandmother  . Colon cancer Neg Hx   . Stomach cancer Neg Hx   . Rectal cancer Neg Hx   . Esophageal cancer Neg Hx     Allergies  Allergen Reactions  . Penicillins Hives    Has patient had a PCN  reaction causing immediate rash, facial/tongue/throat swelling, SOB or lightheadedness with hypotension: Yes Has patient had a PCN reaction causing severe rash involving mucus membranes or skin necrosis: No Has patient had a PCN reaction that required hospitalization: No Has patient had a PCN reaction occurring within the last 10 years: No If all of the above answers are "NO", then may proceed with Cephalosporin use.     Current Outpatient Medications on File Prior to Visit  Medication Sig Dispense Refill  . aspirin 81 MG tablet Take 81 mg by mouth daily.      . Calcium Carbonate-Vitamin D (CALTRATE 600+D) 600-400 MG-UNIT per tablet Take 1 tablet by mouth daily.      [name] CRANBERRY PO Take 2 tablets by mouth daily.     . cyclobenzaprine (FLEXERIL) 5 MG tablet take 1 tablet by mouth three times a day if needed for muscle spasm 30 tablet 1  . estradiol (ESTRACE) 0.1 MG/GM vaginal cream U 0.5 GRAM VAGINALLY HS  3  . fluticasone (FLONASE) 50 MCG/ACT nasal spray Place 2 sprays into both nostrils daily. 16 g 6  . gabapentin (NEURONTIN) 100 MG capsule Take 1 capsule (100 mg total) by mouth 3 (three) times daily. 90 capsule 3  . glucose blood (ONETOUCH VERIO) test strip USE TO TEST BLOOD GLUCOSE TWICE DAILY. 200 each 3  . lisinopril (PRINIVIL,ZESTRIL) 10 MG tablet TAKE 1 TABLET(10 MG) BY MOUTH DAILY 90 tablet 2  . loratadine (CLARITIN) 10 MG tablet Take 10 mg by mouth daily.    . Misc Natural Products (TART CHERRY ADVANCED) CAPS Take 1 capsule by mouth daily.    Glory Rosebush DELICA LANCETS FINE MISC USE TO CHECK BLOOD SUGAR ONCE DAILY 100 each 3  . polyethylene glycol (MIRALAX / GLYCOLAX) packet Take 17 g by mouth daily as needed for mild constipation.    . simvastatin (ZOCOR) 40 MG tablet TAKE 1 TABLET BY MOUTH ONCE DAILY AT 6PM 90 tablet 3  . sulfamethoxazole-trimethoprim (BACTRIM DS,SEPTRA DS) 800-160 MG tablet Take 1 tablet by mouth 2 (two) times daily for 7 days. 14 tablet 0  . Tetrahydrozoline HCl  (VISINE OP) Apply 1 drop to eye daily as needed (RED EYE).    [name] tolterodine (DETROL LA) 2 MG 24 hr capsule Take 1 capsule (2 mg total) by mouth daily. 30 capsule 2  . TURMERIC PO Take 1  tablet by mouth daily.     . [DISCONTINUED] valsartan (DIOVAN) 160 MG tablet Take 1/2 tablet by mouth once daily 30 tablet 5   No current facility-administered medications on file prior to visit.     BP (!) 110/56   Temp (!) 97.3 F (36.3 C)   Wt 151 lb (68.5 kg)   BMI 25.92 kg/m       Objective:   Physical Exam  Constitutional: She is oriented to person, place, and time. She appears well-developed and well-nourished. No distress.  Cardiovascular: Normal rate, regular rhythm, normal heart sounds and intact distal pulses.  Pulmonary/Chest: Effort normal and breath sounds normal.  Musculoskeletal: Normal range of motion. She exhibits tenderness (with deep palpation along left hip ). She exhibits no edema or deformity.  No pain with ROM exercises Walks with steady but limping gait     Neurological: She is alert and oriented to person, place, and time.  Skin: Skin is warm and dry. She is not diaphoretic.  Psychiatric: She has a normal mood and affect. Her behavior is normal. Judgment and thought content normal.  Vitals reviewed.     Assessment & Plan:  1. Left hip pain - DG HIP UNILAT WITH PELVIS 2-3 VIEWS LEFT; Future  2. Urinary frequency  - POC Urinalysis Dipstick  [name], NP

## 2018-10-18 NOTE — Addendum Note (Signed)
Addended by: Gwynne Edinger on: 10/18/2018 03:12 PM   Modules accepted: Orders

## 2018-10-19 ENCOUNTER — Other Ambulatory Visit: Payer: Self-pay | Admitting: Adult Health

## 2018-10-19 MED ORDER — IBUPROFEN 800 MG PO TABS: 800.0000 mg | ORAL_TABLET | Freq: Three times a day (TID) | ORAL | 0 refills | Status: DC | PRN

## 2018-10-24 DIAGNOSIS — M7662 Achilles tendinitis, left leg: Secondary | ICD-10-CM | POA: Diagnosis not present

## 2018-11-01 ENCOUNTER — Telehealth: Payer: Self-pay | Admitting: Family Medicine

## 2018-11-01 NOTE — Telephone Encounter (Signed)
Copied from [place] [id number]. Topic: General - Inquiry >> Nov 01, 2018 12:08 PM Margot Ables wrote: Reason for CRM: pt called stating she came in Thursday 12/5 and urine was collected but no one was in the lab. She said she left it in the window as advised. She is requesting results. Please advise.

## 2018-11-02 NOTE — Telephone Encounter (Signed)
Called and spoke to the pt.  She stated the front desk told her to leave her urine in the lab.  No one was there so she left.  I explained the pt that I did not know she was coming so there was no order for her test.  I asked if she is still symptomatic.  She said "a little bit."  This is not uncommon for her to experience UTI like sx.  Pt will call back for an appt or lab order if not getting better or gets worse.  Will forward to [facility] as [place].

## 2018-11-14 ENCOUNTER — Telehealth: Payer: Self-pay | Admitting: *Deleted

## 2018-11-14 NOTE — Telephone Encounter (Signed)
Copied from [id number]. Topic: Appointment Scheduling - Scheduling Inquiry for Clinic >> Nov 14, 2018  9:04 AM Oneta Rack wrote: Relation to pt: self  Call back number: [phone]  Reason for call:  Patient requesting acute appointment with [facility] only for left shoulder pain, unable to schedule due to the same day blocks for 11/15/18, patient would like a morning appointment, please advise

## 2018-11-17 ENCOUNTER — Encounter: Payer: Self-pay | Admitting: Adult Health

## 2018-11-17 ENCOUNTER — Ambulatory Visit (INDEPENDENT_AMBULATORY_CARE_PROVIDER_SITE_OTHER): Payer: Medicare HMO | Admitting: Adult Health

## 2018-11-17 VITALS — BP 140/60 | HR 99 | Temp 97.9°F | Wt 158.0 lb

## 2018-11-17 DIAGNOSIS — M25511 Pain in right shoulder: Secondary | ICD-10-CM | POA: Diagnosis not present

## 2018-11-17 DIAGNOSIS — N3001 Acute cystitis with hematuria: Secondary | ICD-10-CM

## 2018-11-17 LAB — POCT URINALYSIS DIPSTICK
Bilirubin, UA: NEGATIVE
Glucose, UA: NEGATIVE
Ketones, UA: NEGATIVE
Nitrite, UA: POSITIVE
Protein, UA: NEGATIVE
Spec Grav, UA: 1.02 (ref 1.010–1.025)
Urobilinogen, UA: 0.2 E.U./dL
pH, UA: 6 (ref 5.0–8.0)

## 2018-11-17 MED ORDER — CIPROFLOXACIN HCL 500 MG PO TABS: 500.0000 mg | ORAL_TABLET | Freq: Two times a day (BID) | ORAL | 0 refills | Status: DC

## 2018-11-17 NOTE — Progress Notes (Signed)
Subjective:    Patient ID: Cathy [name], female    DOB: 1946/06/19, 72 y.o.   MRN: [id number]  HPI 72 year old female who  has a past medical history of Chronic constipation, Depression, Diabetes mellitus type II, DJD (degenerative joint disease), Female cystocele, GERD (gastroesophageal reflux disease), History of esophagitis, History of MRSA infection, History of recurrent UTIs, Hyperlipidemia, Hypertension, and Urgency of urination.  She presents to the office today for an acute issue. She reports last week she was hurrying and she had a mechanical fall and landed on cement. She hit her right shoulder on the pavement. She did not hit her head or have any LOC. Reports mild pain and soreness, has not had any decreased ROM, bruising, or decreased grip strength  She would also like to make sure she does not have a UTI. Her symptoms have been present for one week. Her symptoms include that of low back pain, dysuria, frequency and urgency. She has an appointment with a Urologist on 11/28/2017 at [facility].     Review of Systems See HPI   Past Medical History:  Diagnosis Date  . Chronic constipation   . Depression   . Diabetes mellitus type II   . DJD (degenerative joint disease)    knees  . Female cystocele   . GERD (gastroesophageal reflux disease)   . History of esophagitis   . History of MRSA infection    recurrent carbuncle  . History of recurrent UTIs   . Hyperlipidemia   . Hypertension   . Urgency of urination     Social History   Socioeconomic History  . Marital status: Single    Spouse name: Not on file  . Number of children: Not on file  . Years of education: Not on file  . Highest education level: Not on file  Occupational History  . Not on file  Social Needs  . Financial resource strain: Not on file  . Food insecurity:    Worry: Not on file    Inability: Not on file  . Transportation needs:    Medical: Not on file    Non-medical: Not on file    Tobacco Use  . Smoking status: Former Smoker    Years: 1.00    Types: Cigarettes    Last attempt to quit: 02/28/1996    Years since quitting: 22.7  . Smokeless tobacco: Never Used  Substance and Sexual Activity  . Alcohol use: Yes    Alcohol/week: 0.0 standard drinks    Comment: occasional  . Drug use: No  . Sexual activity: Not on file  Lifestyle  . Physical activity:    Days per week: Not on file    Minutes per session: Not on file  . Stress: Not on file  Relationships  . Social connections:    Talks on phone: Not on file    Gets together: Not on file    Attends religious service: Not on file    Active member of club or organization: Not on file    Attends meetings of clubs or organizations: Not on file    Relationship status: Not on file  . Intimate partner violence:    Fear of current or ex partner: Not on file    Emotionally abused: Not on file    Physically abused: Not on file    Forced sexual activity: Not on file  Other Topics Concern  . Not on file  Social History Narrative  Single   Former Smoker  -  quit 10 to 11 years ago (light smoker)   Alcohol use-yes     2 children    Occupation: [employer]     Past Surgical History:  Procedure Laterality Date  . APPENDECTOMY  1980's  . BREAST EXCISIONAL BIOPSY Left 1989  . COLONOSCOPY  last one 06-18-2015  . INGUINAL HERNIA REPAIR Left 05-10-2001  . VAGINAL HYSTERECTOMY  1980's  . VAGINAL PROLAPSE REPAIR N/A 03/02/2016   Procedure: COLOPLAST ANTERIOR  VAULT REPAIR WITH AXIS DERMIS. SACROSPINUS FIXATION, AUGMENTATION WITH AXIS DERMIS;  Surgeon: Carolan Clines, MD;  Location: [facility];  Service: Urology;  Laterality: N/A;    Family History  Problem Relation Age of Onset  . Aneurysm Mother 22       deceased secondary to brain aneurysm  . Liver cancer Father 5       deceased  . Diabetes Unknown        grandmother  . Colon cancer Neg Hx   . Stomach cancer Neg Hx   .  Rectal cancer Neg Hx   . Esophageal cancer Neg Hx     Allergies  Allergen Reactions  . Penicillins Hives    Has patient had a PCN reaction causing immediate rash, facial/tongue/throat swelling, SOB or lightheadedness with hypotension: Yes Has patient had a PCN reaction causing severe rash involving mucus membranes or skin necrosis: No Has patient had a PCN reaction that required hospitalization: No Has patient had a PCN reaction occurring within the last 10 years: No If all of the above answers are "NO", then may proceed with Cephalosporin use.     Current Outpatient Medications on File Prior to Visit  Medication Sig Dispense Refill  . aspirin 81 MG tablet Take 81 mg by mouth daily.      . Calcium Carbonate-Vitamin D (CALTRATE 600+D) 600-400 MG-UNIT per tablet Take 1 tablet by mouth daily.      [name] CRANBERRY PO Take 2 tablets by mouth daily.     . cyclobenzaprine (FLEXERIL) 5 MG tablet take 1 tablet by mouth three times a day if needed for muscle spasm 30 tablet 1  . estradiol (ESTRACE) 0.1 MG/GM vaginal cream U 0.5 GRAM VAGINALLY HS  3  . fluticasone (FLONASE) 50 MCG/ACT nasal spray Place 2 sprays into both nostrils daily. 16 g 6  . gabapentin (NEURONTIN) 100 MG capsule Take 1 capsule (100 mg total) by mouth 3 (three) times daily. 90 capsule 3  . glucose blood (ONETOUCH VERIO) test strip USE TO TEST BLOOD GLUCOSE TWICE DAILY. 200 each 3  . ibuprofen (ADVIL,MOTRIN) 800 MG tablet Take 1 tablet (800 mg total) by mouth every 8 (eight) hours as needed. 30 tablet 0  . lisinopril (PRINIVIL,ZESTRIL) 10 MG tablet TAKE 1 TABLET(10 MG) BY MOUTH DAILY 90 tablet 2  . loratadine (CLARITIN) 10 MG tablet Take 10 mg by mouth daily.    . Misc Natural Products (TART CHERRY ADVANCED) CAPS Take 1 capsule by mouth daily.    Glory Rosebush DELICA LANCETS FINE MISC USE TO CHECK BLOOD SUGAR ONCE DAILY 100 each 3  . polyethylene glycol (MIRALAX / GLYCOLAX) packet Take 17 g by mouth daily as needed for mild  constipation.    . simvastatin (ZOCOR) 40 MG tablet TAKE 1 TABLET BY MOUTH ONCE DAILY AT 6PM 90 tablet 3  . Tetrahydrozoline HCl (VISINE OP) Apply 1 drop to eye daily as needed (RED EYE).    [name] tolterodine (  DETROL LA) 2 MG 24 hr capsule Take 1 capsule (2 mg total) by mouth daily. 30 capsule 2  . TURMERIC PO Take 1 tablet by mouth daily.     . [DISCONTINUED] valsartan (DIOVAN) 160 MG tablet Take 1/2 tablet by mouth once daily 30 tablet 5   No current facility-administered medications on file prior to visit.     BP 140/60 (BP Location: Left Arm, Patient Position: Sitting, Cuff Size: Normal)   Pulse 99   Temp 97.9 F (36.6 C) (Oral)   Wt 158 lb (71.7 kg)   SpO2 99%   BMI 27.12 kg/m       Objective:   Physical Exam Vitals signs and nursing note reviewed.  Constitutional:      Appearance: Normal appearance.  Neck:     Musculoskeletal: Normal range of motion and neck supple.  Cardiovascular:     Rate and Rhythm: Normal rate and regular rhythm.     Pulses: Normal pulses.     Heart sounds: Normal heart sounds.  Pulmonary:     Effort: Pulmonary effort is normal.     Breath sounds: Normal breath sounds.  Abdominal:     General: Abdomen is flat. Bowel sounds are normal.     Palpations: Abdomen is soft. There is no mass.     Tenderness: There is no abdominal tenderness. There is right CVA tenderness and left CVA tenderness.  Musculoskeletal: Normal range of motion.        General: No swelling, tenderness, deformity or signs of injury.  Skin:    Capillary Refill: Capillary refill takes less than 2 seconds.     Findings: No bruising.  Neurological:     General: No focal deficit present.     Mental Status: She is alert and oriented to person, place, and time.     Cranial Nerves: No cranial nerve deficit.     Sensory: No sensory deficit.     Coordination: Coordination normal.     Gait: Gait normal.     Deep Tendon Reflexes: Reflexes normal.  Psychiatric:        Mood and Affect:  Mood normal.        Behavior: Behavior normal.        Thought Content: Thought content normal.        Judgment: Judgment normal.       Assessment & Plan:  1. Acute cystitis with hematuria - Urine Culture - POC Urinalysis Dipstick - ciprofloxacin (CIPRO) 500 MG tablet; Take 1 tablet (500 mg total) by mouth 2 (two) times daily for 5 days.  Dispense: 10 tablet; Refill: 0  2. Acute pain of right shoulder - No abnormality noted. No concern for fracture or dislocation  - Advised heating pad and stretching exercises  [name], NP

## 2018-11-19 LAB — URINE CULTURE
MICRO NUMBER:: 91541144
SPECIMEN QUALITY:: ADEQUATE

## 2018-11-21 ENCOUNTER — Telehealth: Payer: Self-pay | Admitting: Adult Health

## 2018-11-21 DIAGNOSIS — N3001 Acute cystitis with hematuria: Secondary | ICD-10-CM

## 2018-11-21 NOTE — Telephone Encounter (Addendum)
Copied from [place] [id number]. Topic: Quick Communication - See Telephone Encounter >> Nov 21, 2018 10:19 AM Ivar Drape wrote: CRM for notification. See Telephone encounter for: 11/21/18. Patient came in on 11/17/18 complaining of a UTI.  She was given some antibiotics and told if after taking the medication she felt she needed more to let the provider know.  The patient is still experiencing a cloudy urine and frequent urination.  She thinks she does need more antibiotics.  Please advise.

## 2018-11-22 MED ORDER — CIPROFLOXACIN HCL 500 MG PO TABS: 500.0000 mg | ORAL_TABLET | Freq: Two times a day (BID) | ORAL | 0 refills | Status: AC

## 2018-11-22 NOTE — Telephone Encounter (Signed)
Ok to send in another three days of Cipro 500 mg BID

## 2018-11-22 NOTE — Telephone Encounter (Signed)
Medication sent to the pharmacy by e-scribe and pt notified.  She will follow up if not better after completion.

## 2018-11-28 DIAGNOSIS — M7662 Achilles tendinitis, left leg: Secondary | ICD-10-CM | POA: Diagnosis not present

## 2018-11-28 DIAGNOSIS — N949 Unspecified condition associated with female genital organs and menstrual cycle: Secondary | ICD-10-CM | POA: Diagnosis not present

## 2018-11-28 DIAGNOSIS — N8110 Cystocele, unspecified: Secondary | ICD-10-CM | POA: Diagnosis not present

## 2018-11-30 ENCOUNTER — Other Ambulatory Visit: Payer: Self-pay | Admitting: Adult Health

## 2018-12-08 ENCOUNTER — Telehealth: Payer: Self-pay | Admitting: *Deleted

## 2018-12-08 NOTE — Telephone Encounter (Signed)
Reason for CRM:  pt called and stated that she would like to talk to Cory's nurse regarding when she needs to come back in for lab work. Please advise

## 2018-12-08 NOTE — Telephone Encounter (Signed)
Cathy [name], there is nothing in this encounter

## 2018-12-09 NOTE — Telephone Encounter (Signed)
Cathy [name], pt scheduled for 12/16/2018 for A1C check.  Not due until Feb but she will be having bladder surgery and will not be able to come.  FYI.

## 2018-12-14 ENCOUNTER — Other Ambulatory Visit: Payer: Self-pay | Admitting: Adult Health

## 2018-12-14 DIAGNOSIS — Z1231 Encounter for screening mammogram for malignant neoplasm of breast: Secondary | ICD-10-CM

## 2018-12-16 ENCOUNTER — Ambulatory Visit (INDEPENDENT_AMBULATORY_CARE_PROVIDER_SITE_OTHER): Payer: Medicare HMO | Admitting: Adult Health

## 2018-12-16 ENCOUNTER — Other Ambulatory Visit: Payer: Self-pay | Admitting: Adult Health

## 2018-12-16 ENCOUNTER — Encounter: Payer: Self-pay | Admitting: Adult Health

## 2018-12-16 VITALS — BP 124/60 | HR 92 | Temp 97.8°F | Wt 157.0 lb

## 2018-12-16 DIAGNOSIS — E118 Type 2 diabetes mellitus with unspecified complications: Secondary | ICD-10-CM | POA: Diagnosis not present

## 2018-12-16 DIAGNOSIS — M25552 Pain in left hip: Secondary | ICD-10-CM | POA: Diagnosis not present

## 2018-12-16 DIAGNOSIS — R350 Frequency of micturition: Secondary | ICD-10-CM | POA: Diagnosis not present

## 2018-12-16 LAB — POCT URINALYSIS DIPSTICK
Bilirubin, UA: NEGATIVE
Blood, UA: NEGATIVE
Glucose, UA: NEGATIVE
Ketones, UA: NEGATIVE
Leukocytes, UA: NEGATIVE
Nitrite, UA: NEGATIVE
Protein, UA: NEGATIVE
Spec Grav, UA: 1.01 (ref 1.010–1.025)
Urobilinogen, UA: 0.2 E.U./dL
pH, UA: 6.5 (ref 5.0–8.0)

## 2018-12-16 LAB — POCT GLYCOSYLATED HEMOGLOBIN (HGB A1C): Hemoglobin A1C: 6.1 % — AB (ref 4.0–5.6)

## 2018-12-16 MED ORDER — METHYLPREDNISOLONE 4 MG PO TBPK: ORAL_TABLET | ORAL | 0 refills | Status: DC

## 2018-12-16 NOTE — Progress Notes (Signed)
Subjective:    Patient ID: Cathy [name], female    DOB: 1945/12/11, 73 y.o.   MRN: [id number]  HPI 73 year old female who  has a past medical history of Chronic constipation, Depression, Diabetes mellitus type II, DJD (degenerative joint disease), Female cystocele, GERD (gastroesophageal reflux disease), History of esophagitis, History of MRSA infection, History of recurrent UTIs, Hyperlipidemia, Hypertension, and Urgency of urination.  She presents to the office today for follow up regarding DM.  Her diabetes is currently diet controlled.  Her last A1c in August 2019 was 6.5, this had increased slightly from 6.2.  She does not check her blood sugars on a routine basis but when she does she reports readings below 120.  She denies any hypoglycemic events, and has not experienced any polyuria or polydipsia  She would also like to make sure she does not have a UTI. Has a history of chronic UTI's and is having urinary frequency. No dysuria urgency or incontinence . She will be having a bladder tack next month at [facility].   She also reports left hip pain x 1 week. She was seen back in November for hip pain s/p mechanical fall at home. Xrays were negative and this pain resolved. Over the last week she was at the [facility] in [place], [place] and felt as though she pulled a muscle in her left hip/leg while walking. Denies falls. Pain is felt as a " cramp" and is located in the left upper thigh and left buttocks. She does feel as though this pain is improving.    Review of Systems See HPI   Past Medical History:  Diagnosis Date  . Chronic constipation   . Depression   . Diabetes mellitus type II   . DJD (degenerative joint disease)    knees  . Female cystocele   . GERD (gastroesophageal reflux disease)   . History of esophagitis   . History of MRSA infection    recurrent carbuncle  . History of recurrent UTIs   . Hyperlipidemia   . Hypertension   . Urgency of urination     Social History     Socioeconomic History  . Marital status: Single    Spouse name: Not on file  . Number of children: Not on file  . Years of education: Not on file  . Highest education level: Not on file  Occupational History  . Not on file  Social Needs  . Financial resource strain: Not on file  . Food insecurity:    Worry: Not on file    Inability: Not on file  . Transportation needs:    Medical: Not on file    Non-medical: Not on file  Tobacco Use  . Smoking status: Former Smoker    Years: 1.00    Types: Cigarettes    Last attempt to quit: 02/28/1996    Years since quitting: 22.8  . Smokeless tobacco: Never Used  Substance and Sexual Activity  . Alcohol use: Yes    Alcohol/week: 0.0 standard drinks    Comment: occasional  . Drug use: No  . Sexual activity: Not on file  Lifestyle  . Physical activity:    Days per week: Not on file    Minutes per session: Not on file  . Stress: Not on file  Relationships  . Social connections:    Talks on phone: Not on file    Gets together: Not on file    Attends religious service: Not on file  Active member of club or organization: Not on file    Attends meetings of clubs or organizations: Not on file    Relationship status: Not on file  . Intimate partner violence:    Fear of current or ex partner: Not on file    Emotionally abused: Not on file    Physically abused: Not on file    Forced sexual activity: Not on file  Other Topics Concern  . Not on file  Social History Narrative   Single   Former Smoker  -  quit 10 to 11 years ago (light smoker)   Alcohol use-yes     2 children    Occupation: [employer]     Past Surgical History:  Procedure Laterality Date  . APPENDECTOMY  1980's  . BREAST EXCISIONAL BIOPSY Left 1989  . COLONOSCOPY  last one 06-18-2015  . INGUINAL HERNIA REPAIR Left 05-10-2001  . VAGINAL HYSTERECTOMY  1980's  . VAGINAL PROLAPSE REPAIR N/A 03/02/2016   Procedure: COLOPLAST ANTERIOR  VAULT REPAIR  WITH AXIS DERMIS. SACROSPINUS FIXATION, AUGMENTATION WITH AXIS DERMIS;  Surgeon: Carolan Clines, MD;  Location: [facility];  Service: Urology;  Laterality: N/A;    Family History  Problem Relation Age of Onset  . Aneurysm Mother 78       deceased secondary to brain aneurysm  . Liver cancer Father 14       deceased  . Diabetes Unknown        grandmother  . Colon cancer Neg Hx   . Stomach cancer Neg Hx   . Rectal cancer Neg Hx   . Esophageal cancer Neg Hx     Allergies  Allergen Reactions  . Penicillins Hives    Has patient had a PCN reaction causing immediate rash, facial/tongue/throat swelling, SOB or lightheadedness with hypotension: Yes Has patient had a PCN reaction causing severe rash involving mucus membranes or skin necrosis: No Has patient had a PCN reaction that required hospitalization: No Has patient had a PCN reaction occurring within the last 10 years: No If all of the above answers are "NO", then may proceed with Cephalosporin use.     Current Outpatient Medications on File Prior to Visit  Medication Sig Dispense Refill  . aspirin 81 MG tablet Take 81 mg by mouth daily.      . Calcium Carbonate-Vitamin D (CALTRATE 600+D) 600-400 MG-UNIT per tablet Take 1 tablet by mouth daily.      [name] CRANBERRY PO Take 2 tablets by mouth daily.     . cyclobenzaprine (FLEXERIL) 5 MG tablet take 1 tablet by mouth three times a day if needed for muscle spasm 30 tablet 1  . estradiol (ESTRACE) 0.1 MG/GM vaginal cream U 0.5 GRAM VAGINALLY HS  3  . fluticasone (FLONASE) 50 MCG/ACT nasal spray Place 2 sprays into both nostrils daily. 16 g 6  . gabapentin (NEURONTIN) 100 MG capsule Take 1 capsule (100 mg total) by mouth 3 (three) times daily. 90 capsule 3  . glucose blood (ONETOUCH VERIO) test strip USE TO TEST BLOOD GLUCOSE TWICE DAILY. 200 each 3  . ibuprofen (ADVIL,MOTRIN) 800 MG tablet TAKE 1 TABLET(800 MG) BY MOUTH EVERY 8 HOURS AS NEEDED 30 tablet 0  . lisinopril  (PRINIVIL,ZESTRIL) 10 MG tablet TAKE 1 TABLET(10 MG) BY MOUTH DAILY 90 tablet 2  . loratadine (CLARITIN) 10 MG tablet Take 10 mg by mouth daily.    . Misc Natural Products (TART CHERRY ADVANCED) CAPS Take 1 capsule  by mouth daily.    Glory Rosebush DELICA LANCETS FINE MISC USE TO CHECK BLOOD SUGAR ONCE DAILY 100 each 3  . polyethylene glycol (MIRALAX / GLYCOLAX) packet Take 17 g by mouth daily as needed for mild constipation.    . simvastatin (ZOCOR) 40 MG tablet TAKE 1 TABLET BY MOUTH ONCE DAILY AT 6PM 90 tablet 3  . Tetrahydrozoline HCl (VISINE OP) Apply 1 drop to eye daily as needed (RED EYE).    [name] tolterodine (DETROL LA) 2 MG 24 hr capsule Take 1 capsule (2 mg total) by mouth daily. 30 capsule 2  . TURMERIC PO Take 1 tablet by mouth daily.     . [DISCONTINUED] valsartan (DIOVAN) 160 MG tablet Take 1/2 tablet by mouth once daily 30 tablet 5   No current facility-administered medications on file prior to visit.     BP 124/60 (BP Location: Left Arm, Patient Position: Sitting, Cuff Size: Normal)   Pulse 92   Temp 97.8 F (36.6 C) (Oral)   Wt 157 lb (71.2 kg)   SpO2 98%   BMI 26.95 kg/m       Objective:   Physical Exam Vitals signs and nursing note reviewed.  Constitutional:      Appearance: Normal appearance.  Cardiovascular:     Rate and Rhythm: Normal rate and regular rhythm.     Pulses: Normal pulses.     Heart sounds: Normal heart sounds.  Pulmonary:     Effort: Pulmonary effort is normal.     Breath sounds: Normal breath sounds.  Musculoskeletal:        General: Tenderness present.     Left hip: She exhibits tenderness. She exhibits normal range of motion, normal strength, no bony tenderness, no swelling, no crepitus and no deformity.  Skin:    General: Skin is warm and dry.     Capillary Refill: Capillary refill takes less than 2 seconds.  Neurological:     General: No focal deficit present.     Mental Status: She is alert and oriented to person, place, and time.    Psychiatric:        Mood and Affect: Mood normal.        Behavior: Behavior normal.        Thought Content: Thought content normal.        Judgment: Judgment normal.       Assessment & Plan:  1. Controlled type 2 diabetes mellitus with complication, without long-term current use of insulin (HCC)  - POCT glycosylated hemoglobin (Hb A1C)- 6.1 Has improved   2. Urinary frequency  - POCT urinalysis dipstick- negative   3. Left hip pain - Appears to be muscular in nature. Will provide medrol dose pack. If no improvement by next week then consider MRI  - methylPREDNISolone (MEDROL DOSEPAK) 4 MG TBPK tablet; Take as directed  Dispense: 21 tablet; Refill: 0   [name], NP

## 2018-12-26 DIAGNOSIS — E0800 Diabetes mellitus due to underlying condition with hyperosmolarity without nonketotic hyperglycemic-hyperosmolar coma (NKHHC): Secondary | ICD-10-CM | POA: Diagnosis not present

## 2018-12-26 DIAGNOSIS — N8110 Cystocele, unspecified: Secondary | ICD-10-CM | POA: Diagnosis not present

## 2018-12-26 DIAGNOSIS — N993 Prolapse of vaginal vault after hysterectomy: Secondary | ICD-10-CM | POA: Diagnosis not present

## 2018-12-26 DIAGNOSIS — N393 Stress incontinence (female) (male): Secondary | ICD-10-CM | POA: Diagnosis not present

## 2018-12-26 NOTE — Progress Notes (Signed)
[name] [address] [place], [place] [address] Phone: [phone] Subjective:    I Cathy [name] am serving as a Education administrator for Dr. Hulan Saas.   CC: Bilateral knee pain  KWI:[id number]    10/04/2018  Worsening pain.  Bilateral injections given today, discussed icing regimen and home exercise.  Discussed which activities to do which was to avoid.  Patient is turning 74 weeks.  Will be very active she states.  Topical anti-inflammatories given.  Follow-up again in 3 months  Spent  25 minutes with patient face-to-face and had greater than 50% of counseling including as described above in assessment and plan.   Updated 12/27/2018  Cathy [name] is a 73 y.o. female coming in with complaint of bilateral knee pain. Knees are painful. Quad pain. Fluid on the left knee. Fell on her back. Shoulder and right glut is painful. Walking down a hill.  Onset- fell 1 month ago patient states that has been doing relatively well      Past Medical History:  Diagnosis Date  . Chronic constipation   . Depression   . Diabetes mellitus type II   . DJD (degenerative joint disease)    knees  . Female cystocele   . GERD (gastroesophageal reflux disease)   . History of esophagitis   . History of MRSA infection    recurrent carbuncle  . History of recurrent UTIs   . Hyperlipidemia   . Hypertension   . Urgency of urination    Past Surgical History:  Procedure Laterality Date  . APPENDECTOMY  1980's  . BREAST EXCISIONAL BIOPSY Left 1989  . COLONOSCOPY  last one 06-18-2015  . INGUINAL HERNIA REPAIR Left 05-10-2001  . VAGINAL HYSTERECTOMY  1980's  . VAGINAL PROLAPSE REPAIR N/A 03/02/2016   Procedure: COLOPLAST ANTERIOR  VAULT REPAIR WITH AXIS DERMIS. SACROSPINUS FIXATION, AUGMENTATION WITH AXIS DERMIS;  Surgeon: Carolan Clines, MD;  Location: [facility];  Service: Urology;  Laterality: N/A;   Social History   Socioeconomic History  .  Marital status: Single    Spouse name: Not on file  . Number of children: Not on file  . Years of education: Not on file  . Highest education level: Not on file  Occupational History  . Not on file  Social Needs  . Financial resource strain: Not on file  . Food insecurity:    Worry: Not on file    Inability: Not on file  . Transportation needs:    Medical: Not on file    Non-medical: Not on file  Tobacco Use  . Smoking status: Former Smoker    Years: 1.00    Types: Cigarettes    Last attempt to quit: 02/28/1996    Years since quitting: 22.8  . Smokeless tobacco: Never Used  Substance and Sexual Activity  . Alcohol use: Yes    Alcohol/week: 0.0 standard drinks    Comment: occasional  . Drug use: No  . Sexual activity: Not on file  Lifestyle  . Physical activity:    Days per week: Not on file    Minutes per session: Not on file  . Stress: Not on file  Relationships  . Social connections:    Talks on phone: Not on file    Gets together: Not on file    Attends religious service: Not on file    Active member of club or organization: Not on file    Attends meetings of clubs or organizations:  Not on file    Relationship status: Not on file  Other Topics Concern  . Not on file  Social History Narrative   Single   Former Smoker  -  quit 10 to 11 years ago (light smoker)   Alcohol use-yes     2 children    Occupation: [employer]    Allergies  Allergen Reactions  . Penicillins Hives    Has patient had a PCN reaction causing immediate rash, facial/tongue/throat swelling, SOB or lightheadedness with hypotension: Yes Has patient had a PCN reaction causing severe rash involving mucus membranes or skin necrosis: No Has patient had a PCN reaction that required hospitalization: No Has patient had a PCN reaction occurring within the last 10 years: No If all of the above answers are "NO", then may proceed with Cephalosporin use.    Family History  Problem  Relation Age of Onset  . Aneurysm Mother 38       deceased secondary to brain aneurysm  . Liver cancer Father 56       deceased  . Diabetes Unknown        grandmother  . Colon cancer Neg Hx   . Stomach cancer Neg Hx   . Rectal cancer Neg Hx   . Esophageal cancer Neg Hx     Current Outpatient Medications (Endocrine & Metabolic):  .  methylPREDNISolone (MEDROL DOSEPAK) 4 MG TBPK tablet, Take as directed  Current Outpatient Medications (Cardiovascular):  .  lisinopril (PRINIVIL,ZESTRIL) 10 MG tablet, TAKE 1 TABLET(10 MG) BY MOUTH DAILY .  simvastatin (ZOCOR) 40 MG tablet, TAKE 1 TABLET BY MOUTH ONCE DAILY AT 6PM  Current Outpatient Medications (Respiratory):  .  fluticasone (FLONASE) 50 MCG/ACT nasal spray, SHAKE LIQUID AND USE 2 SPRAYS IN EACH NOSTRIL DAILY .  loratadine (CLARITIN) 10 MG tablet, Take 10 mg by mouth daily.  Current Outpatient Medications (Analgesics):  .  aspirin 81 MG tablet, Take 81 mg by mouth daily.   [name]  ibuprofen (ADVIL,MOTRIN) 800 MG tablet, TAKE 1 TABLET(800 MG) BY MOUTH EVERY 8 HOURS AS NEEDED   Current Outpatient Medications (Other):  [name]  Calcium Carbonate-Vitamin D (CALTRATE 600+D) 600-400 MG-UNIT per tablet, Take 1 tablet by mouth daily.   [name]  CRANBERRY PO, Take 2 tablets by mouth daily.  .  cyclobenzaprine (FLEXERIL) 5 MG tablet, take 1 tablet by mouth three times a day if needed for muscle spasm .  estradiol (ESTRACE) 0.1 MG/GM vaginal cream, U 0.5 GRAM VAGINALLY HS .  gabapentin (NEURONTIN) 100 MG capsule, Take 2 capsules (200 mg total) by mouth at bedtime. [name]  glucose blood (ONETOUCH VERIO) test strip, USE TO TEST BLOOD GLUCOSE TWICE DAILY. [name]  Misc Natural Products (TART CHERRY ADVANCED) CAPS, Take 1 capsule by mouth daily. Glory Rosebush DELICA LANCETS FINE MISC, USE TO CHECK BLOOD SUGAR ONCE DAILY .  polyethylene glycol (MIRALAX / GLYCOLAX) packet, Take 17 g by mouth daily as needed for mild constipation. .  Tetrahydrozoline HCl (VISINE OP), Apply 1  drop to eye daily as needed (RED EYE). [name]  tolterodine (DETROL LA) 2 MG 24 hr capsule, Take 1 capsule (2 mg total) by mouth daily. .  TURMERIC PO, Take 1 tablet by mouth daily.     Past medical history, social, surgical and family history all reviewed in electronic medical record.  No pertanent information unless stated regarding to the chief complaint.   Review of Systems:  No headache, visual changes, nausea, vomiting, diarrhea, constipation, dizziness,  abdominal pain, skin rash, fevers, chills, night sweats, weight loss, swollen lymph nodes, body aches, joint swelling, muscle aches, chest pain, shortness of breath, mood changes.   Objective  Blood pressure 120/64, pulse 85, height 5\' 4"  (1.626 m), weight 156 lb (70.8 kg), SpO2 98 %.    General: No apparent distress alert and oriented x3 mood and affect normal, dressed appropriately.  HEENT: Pupils equal, extraocular movements intact  Respiratory: Patient's speak in full sentences and does not appear short of breath  Cardiovascular: No lower extremity edema, non tender, no erythema  Skin: Warm dry intact with no signs of infection or rash on extremities or on axial skeleton.  Abdomen: Soft nontender  Neuro: Cranial nerves II through XII are intact, neurovascularly intact in all extremities with 2+ DTRs and 2+ pulses.  Lymph: No lymphadenopathy of posterior or anterior cervical chain or axillae bilaterally.  Gait severe antalgic gait MSK:  Non tender with full range of motion and good stability and symmetric strength and tone of shoulders, elbows, wrist, hip, and ankles bilaterally.  Knee: Bilateral valgus deformity noted.  Abnormal thigh to calf ratio.  Tender to palpation over medial and PF joint line.  Range of motion decrease in 5 degrees instability with valgus force.  painful patellar compression. Patellar glide with moderate crepitus. Patellar and quadriceps tendons unremarkable. Hamstring and quadriceps strength is  normal.  Back exam does have some loss of lordosis with some mild degenerative scoliosis.  Patient has negative straight leg test.  Patient does have some pain with increasing with Corky Sox test.  Neurovascular intact distally.  Right shoulder exam shows some mild impingement but full range of motion.  Strength of the rotator cuff 4+ out of 5 compared to contralateral side.  Mild positive crossover test After informed written and verbal consent, patient was seated on exam table. Right knee was prepped with alcohol swab and utilizing anterolateral approach, patient's right knee space was injected with 4:1  marcaine 0.5%: Kenalog 40mg /dL. Patient tolerated the procedure well without immediate complications.  After informed written and verbal consent, patient was seated on exam table. Left knee was prepped with alcohol swab and utilizing anterolateral approach, patient's left knee space was injected with 4:1  marcaine 0.5%: Kenalog 40mg /dL. Patient tolerated the procedure well without immediate complications.   Impression and Recommendations:     This case required medical decision making of moderate complexity. The above documentation has been reviewed and is accurate and complete [name], DO       Note: This dictation was prepared with Dragon dictation along with smaller phrase technology. Any transcriptional errors that result from this process are unintentional.

## 2018-12-27 ENCOUNTER — Ambulatory Visit (INDEPENDENT_AMBULATORY_CARE_PROVIDER_SITE_OTHER)
Admission: RE | Admit: 2018-12-27 | Discharge: 2018-12-27 | Disposition: A | Discharge status: Home / Self Care | Payer: Medicare HMO | Source: Ambulatory Visit | Attending: Family Medicine | Admitting: Family Medicine

## 2018-12-27 ENCOUNTER — Encounter: Payer: Self-pay | Admitting: Family Medicine

## 2018-12-27 ENCOUNTER — Ambulatory Visit (INDEPENDENT_AMBULATORY_CARE_PROVIDER_SITE_OTHER): Payer: Medicare HMO | Admitting: Family Medicine

## 2018-12-27 VITALS — BP 120/64 | HR 85 | Ht 64.0 in | Wt 156.0 lb

## 2018-12-27 DIAGNOSIS — M549 Dorsalgia, unspecified: Secondary | ICD-10-CM

## 2018-12-27 DIAGNOSIS — G8929 Other chronic pain: Secondary | ICD-10-CM

## 2018-12-27 DIAGNOSIS — M5441 Lumbago with sciatica, right side: Secondary | ICD-10-CM | POA: Diagnosis not present

## 2018-12-27 DIAGNOSIS — M170 Bilateral primary osteoarthritis of knee: Secondary | ICD-10-CM | POA: Diagnosis not present

## 2018-12-27 DIAGNOSIS — M25511 Pain in right shoulder: Secondary | ICD-10-CM

## 2018-12-27 DIAGNOSIS — M545 Low back pain: Secondary | ICD-10-CM | POA: Diagnosis not present

## 2018-12-27 MED ORDER — GABAPENTIN 100 MG PO CAPS: 200.0000 mg | ORAL_CAPSULE | Freq: Every day | ORAL | 3 refills | Status: DC

## 2018-12-27 NOTE — Assessment & Plan Note (Signed)
X-ray of the back ordered today.  We will make sure that there is no occult fracture.  Likely more of some irritation.  Home exercises given, icing regimen, encourage core strengthening.  Follow-up again in 4 weeks.  Worsening symptoms will consider advanced imaging but likely not needed

## 2018-12-27 NOTE — Patient Instructions (Addendum)
Good to see you  Ice is your friend Keep hands within peripheral vision  Exercises 3 times a week.  Injected both knees again  Try to stay upright  See me again in 608 weeks if not doing well or otherwise can repeat knee injections every 3 months

## 2018-12-27 NOTE — Assessment & Plan Note (Signed)
Bilateral injections given again.  Discussed the possibility of viscous supplementation but did not have significant improvement with it.  Patient will continue with the steroid injections in 72-month intervals.  Does not want any surgical intervention

## 2018-12-27 NOTE — Assessment & Plan Note (Signed)
Fall noted.  No significant deformity, rotator cuff strength intact.  X-rays ordered today, home exercises given, follow-up in 4 weeks

## 2019-01-02 ENCOUNTER — Ambulatory Visit: Payer: Medicare HMO | Admitting: Family Medicine

## 2019-01-02 ENCOUNTER — Telehealth: Payer: Self-pay | Admitting: *Deleted

## 2019-01-02 NOTE — Telephone Encounter (Signed)
Copied from [place] [id number]. Topic: General - Other >> Jan 02, 2019 11:22 AM Nils Flack wrote: Reason for CRM: pt would like to have call back about xray from last tues. She saw Dr Tamala Julian Please call [phone] before 2  [phone] after 2

## 2019-01-02 NOTE — Telephone Encounter (Signed)
Discussed with pt

## 2019-01-05 ENCOUNTER — Other Ambulatory Visit: Payer: Self-pay | Admitting: Adult Health

## 2019-01-15 IMAGING — DX DG CERVICAL SPINE COMPLETE 4+V
5 series · 5 of 5 positions shown · non-contrast
Comparison: None.

CLINICAL DATA: Cervical radiculopathy. Tingling in the arms with
numbness

EXAM:
CERVICAL SPINE - COMPLETE 4+ VIEW

[cervical spine lat]
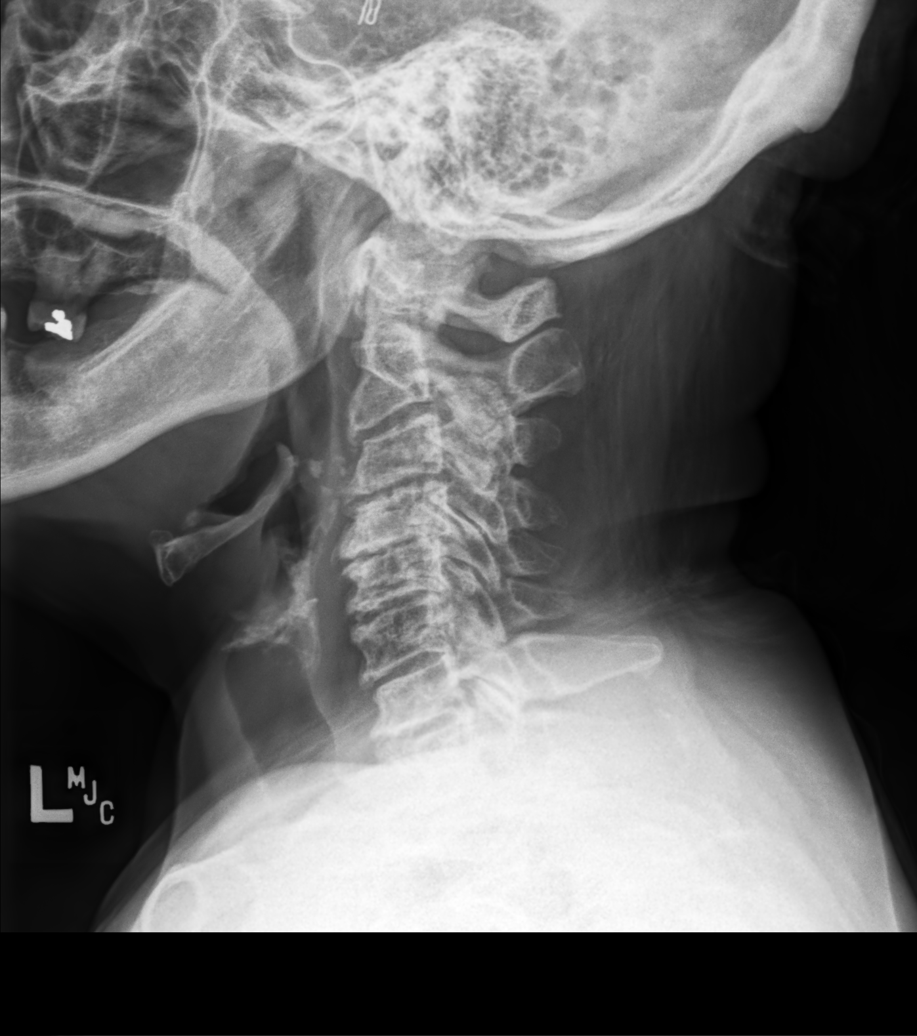

[cervical spine oblique (1 of 2)]
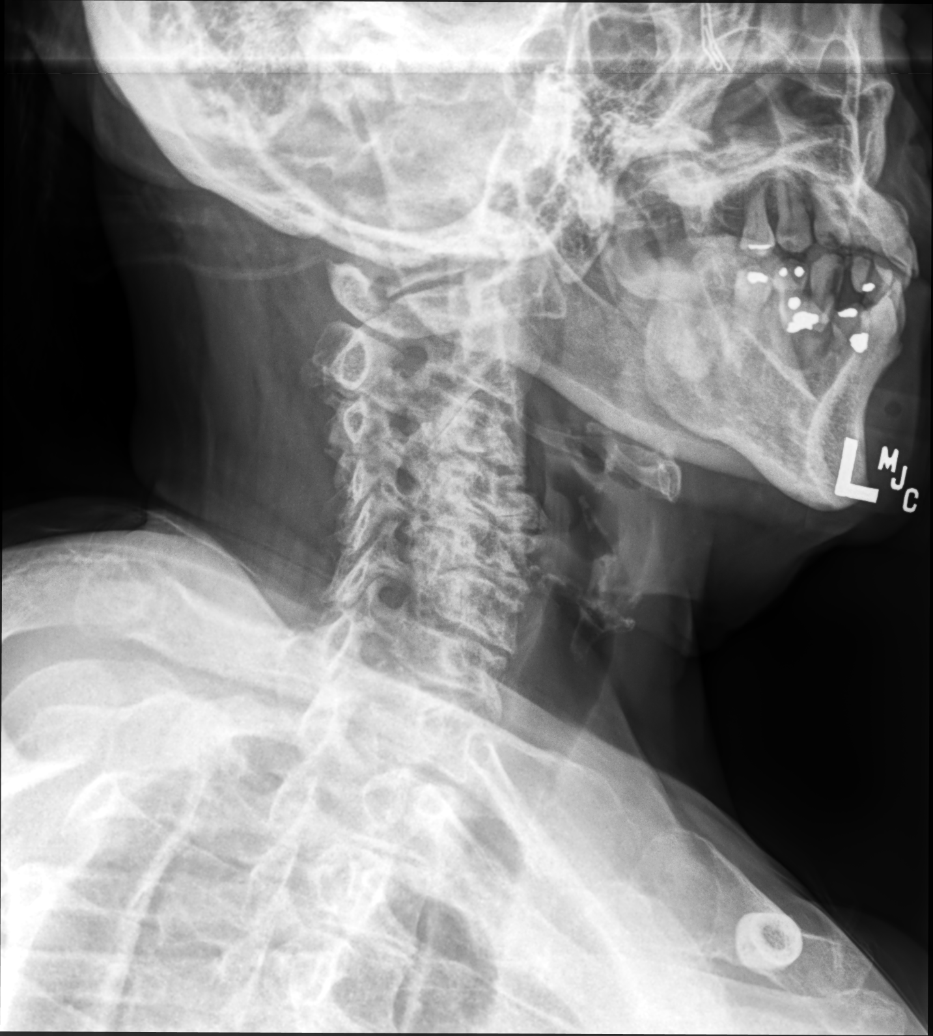

[cervical spine oblique (2 of 2)]
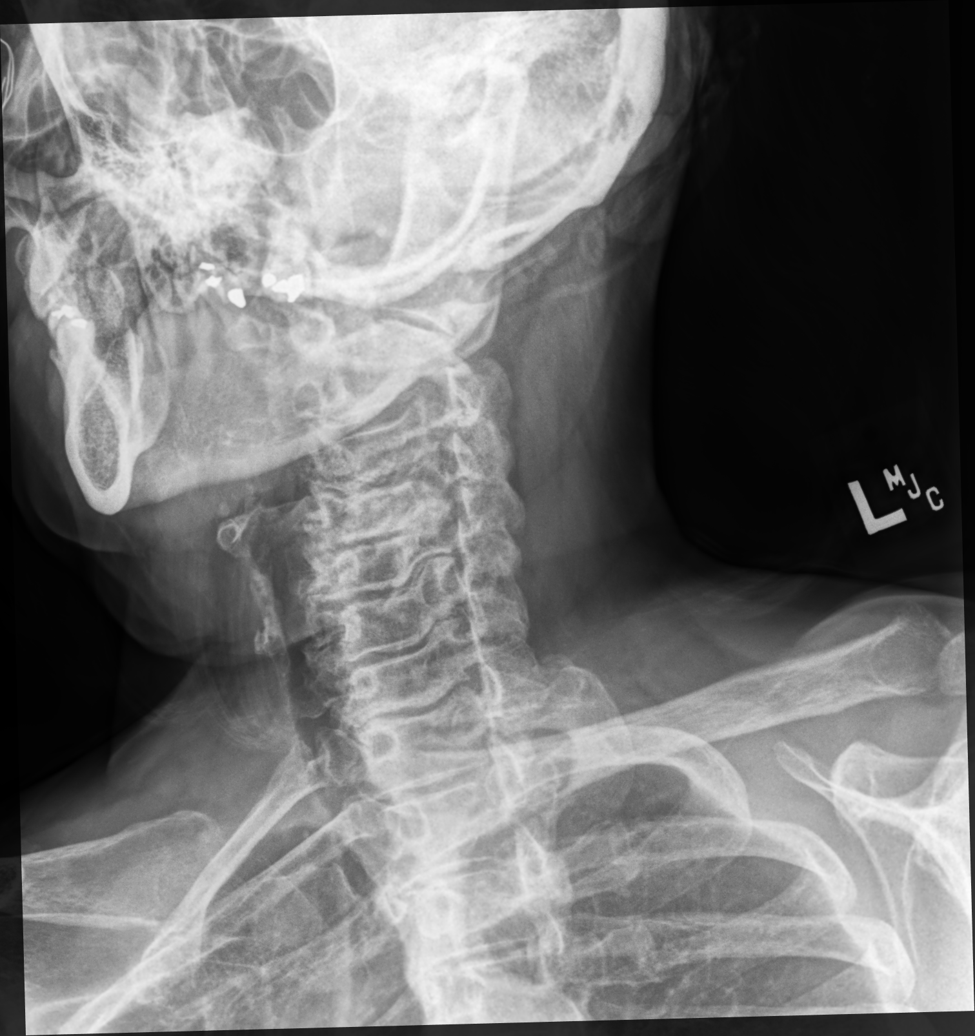

[cervical spine ap]
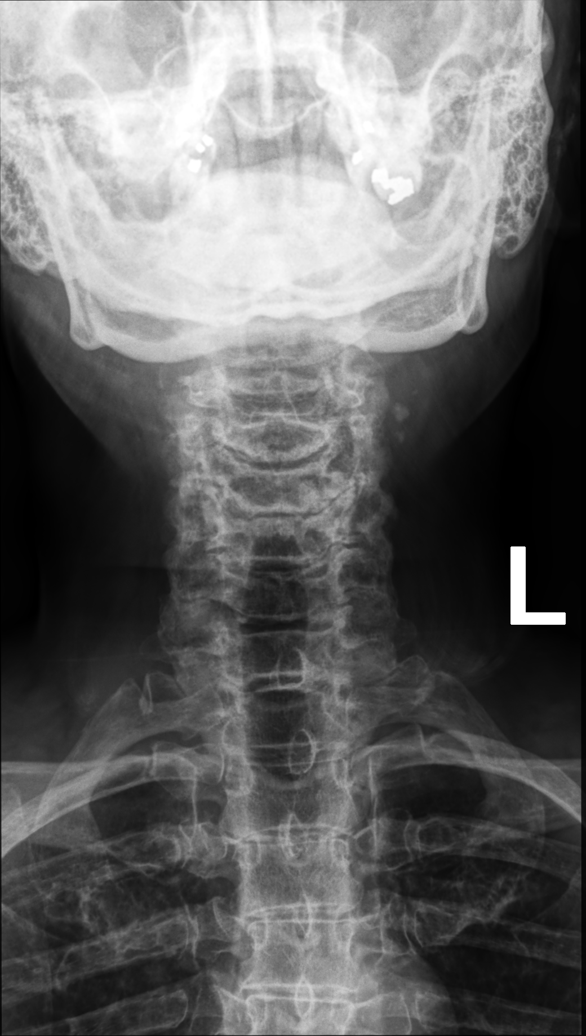

[cervical spine open mouth ap]
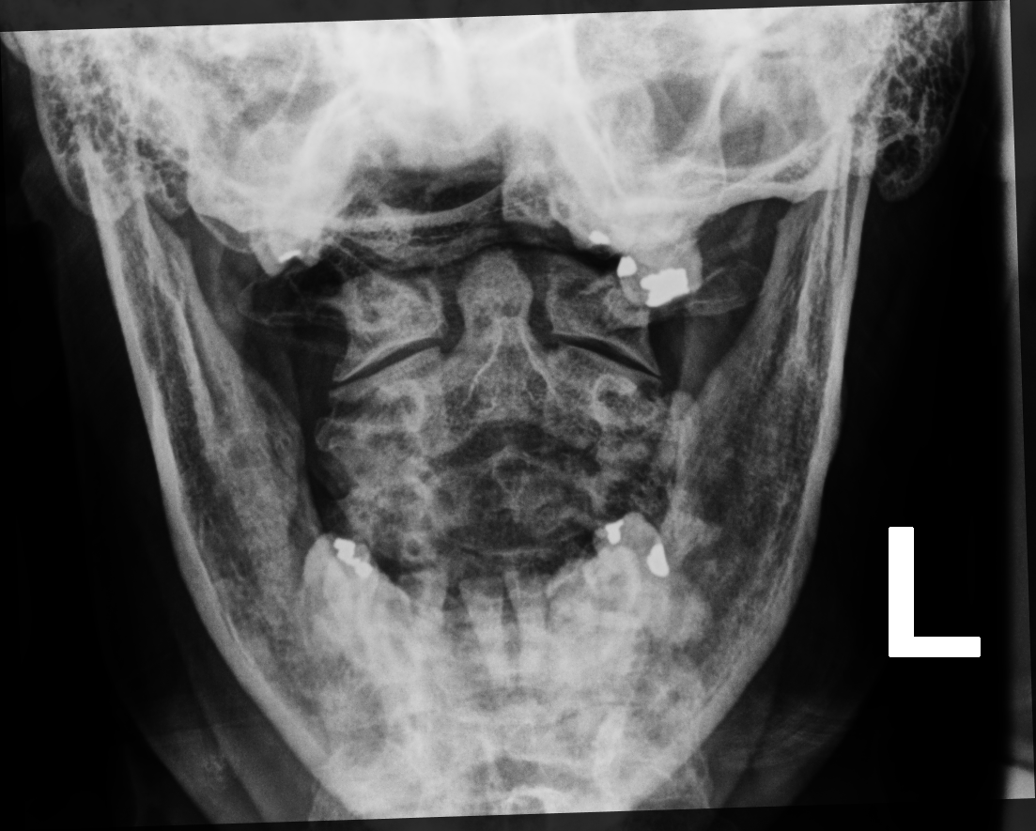

[5 of 5 positions shown; findings below may reference images not displayed]

FINDINGS: There is no evidence of cervical spine fracture or prevertebral soft
tissue swelling. Alignment is normal. No other significant bone
abnormalities are identified. Degenerative disc disease with disc
height loss throughout the cervical spine most severe at C3-4, C4-5
and C5-6. Bilateral severe facet arthropathy at C2-3, C3-4 C6-7 and
C7-T1. Bilateral uncovertebral degenerative changes at C5-6.

Left carotid artery atherosclerosis.
IMPRESSION: Diffuse cervical spine spondylosis as described above.

## 2019-01-17 DIAGNOSIS — N393 Stress incontinence (female) (male): Secondary | ICD-10-CM | POA: Diagnosis not present

## 2019-01-17 DIAGNOSIS — N838 Other noninflammatory disorders of ovary, fallopian tube and broad ligament: Secondary | ICD-10-CM | POA: Diagnosis not present

## 2019-01-17 DIAGNOSIS — D270 Benign neoplasm of right ovary: Secondary | ICD-10-CM | POA: Diagnosis not present

## 2019-01-17 DIAGNOSIS — Z9851 Tubal ligation status: Secondary | ICD-10-CM | POA: Diagnosis not present

## 2019-01-17 DIAGNOSIS — E119 Type 2 diabetes mellitus without complications: Secondary | ICD-10-CM | POA: Diagnosis not present

## 2019-01-17 DIAGNOSIS — Z780 Asymptomatic menopausal state: Secondary | ICD-10-CM | POA: Diagnosis not present

## 2019-01-17 DIAGNOSIS — N812 Incomplete uterovaginal prolapse: Secondary | ICD-10-CM | POA: Diagnosis not present

## 2019-01-17 DIAGNOSIS — N8110 Cystocele, unspecified: Secondary | ICD-10-CM | POA: Diagnosis not present

## 2019-01-17 DIAGNOSIS — N736 Female pelvic peritoneal adhesions (postinfective): Secondary | ICD-10-CM | POA: Diagnosis not present

## 2019-01-17 DIAGNOSIS — Z9889 Other specified postprocedural states: Secondary | ICD-10-CM | POA: Diagnosis not present

## 2019-01-17 DIAGNOSIS — Z5331 Laparoscopic surgical procedure converted to open procedure: Secondary | ICD-10-CM | POA: Diagnosis not present

## 2019-01-17 HISTORY — PX: PERINEOPLASTY: SHX2218

## 2019-01-17 HISTORY — PX: SALPINGECTOMY: SHX328

## 2019-01-17 HISTORY — PX: LAPAROSCOPIC LYSIS OF ADHESIONS: SHX5905

## 2019-01-17 HISTORY — PX: OOPHORECTOMY: SHX6387

## 2019-01-18 DIAGNOSIS — Z9889 Other specified postprocedural states: Secondary | ICD-10-CM | POA: Diagnosis not present

## 2019-01-18 DIAGNOSIS — N736 Female pelvic peritoneal adhesions (postinfective): Secondary | ICD-10-CM | POA: Diagnosis not present

## 2019-01-18 DIAGNOSIS — N393 Stress incontinence (female) (male): Secondary | ICD-10-CM | POA: Diagnosis not present

## 2019-01-18 DIAGNOSIS — E119 Type 2 diabetes mellitus without complications: Secondary | ICD-10-CM | POA: Diagnosis not present

## 2019-01-18 DIAGNOSIS — Z9851 Tubal ligation status: Secondary | ICD-10-CM | POA: Diagnosis not present

## 2019-01-18 DIAGNOSIS — N8110 Cystocele, unspecified: Secondary | ICD-10-CM | POA: Diagnosis not present

## 2019-01-18 DIAGNOSIS — Z780 Asymptomatic menopausal state: Secondary | ICD-10-CM | POA: Diagnosis not present

## 2019-01-23 DIAGNOSIS — R8290 Unspecified abnormal findings in urine: Secondary | ICD-10-CM | POA: Diagnosis not present

## 2019-01-23 DIAGNOSIS — N8110 Cystocele, unspecified: Secondary | ICD-10-CM | POA: Diagnosis not present

## 2019-01-26 ENCOUNTER — Encounter: Payer: Self-pay | Admitting: Family Medicine

## 2019-02-03 DIAGNOSIS — R399 Unspecified symptoms and signs involving the genitourinary system: Secondary | ICD-10-CM | POA: Diagnosis not present

## 2019-02-07 ENCOUNTER — Other Ambulatory Visit: Payer: Self-pay

## 2019-02-07 ENCOUNTER — Encounter: Payer: Self-pay | Admitting: Family Medicine

## 2019-02-07 ENCOUNTER — Ambulatory Visit (INDEPENDENT_AMBULATORY_CARE_PROVIDER_SITE_OTHER): Payer: Medicare HMO | Admitting: Family Medicine

## 2019-02-07 DIAGNOSIS — M170 Bilateral primary osteoarthritis of knee: Secondary | ICD-10-CM | POA: Diagnosis not present

## 2019-02-07 NOTE — Assessment & Plan Note (Signed)
Patient given Monovisc injection.  Discussed icing regimen and home exercise.  Discussed which activities to do which was to avoid.  Discussed icing regimen.  Patient continues to have significant amount of pain and if this does not help for 6 months and referred for possible surgical intervention.

## 2019-02-07 NOTE — Patient Instructions (Addendum)
Good to see you  Ice is your friend Stay active Injected monovisc bilaterally  I really hope it helps  See me again in 3 months

## 2019-02-07 NOTE — Progress Notes (Signed)
[name] [name], [place] [address] Phone: [phone] Subjective:   I Cathy [name] am serving as a Education administrator for Dr. Hulan Saas.   CC: Bilateral knee and back pain  KCM:[id number]   12/27/2018 Fall noted.  No significant deformity, rotator cuff strength intact.  X-rays ordered today, home exercises given, follow-up in 4 weeks  Bilateral injections given again.  Discussed the possibility of viscous supplementation but did not have significant improvement with it.  Patient will continue with the steroid injections in 35-month intervals.  Does not want any surgical intervention  X-ray of the back ordered today.  We will make sure that there is no occult fracture.  Likely more of some irritation.  Home exercises given, icing regimen, encourage core strengthening.  Follow-up again in 4 weeks.  Worsening symptoms will consider advanced imaging but likely not needed  02/07/2019 Cathy [name] is a 73 y.o. female coming in with complaint of bilateral knee pain. States her knees are painful.  Arthritic changes of the knees bilaterally.  Steroid injection was not very helpful.  Patient did have surgery for vaginal prolapse.  Doing a little better overall.  Trying to increase activity but finds it difficult secondary to the pain.     Past Medical History:  Diagnosis Date  . Chronic constipation   . Depression   . Diabetes mellitus type II   . DJD (degenerative joint disease)    knees  . Female cystocele   . GERD (gastroesophageal reflux disease)   . History of esophagitis   . History of MRSA infection    recurrent carbuncle  . History of recurrent UTIs   . Hyperlipidemia   . Hypertension   . Urgency of urination    Past Surgical History:  Procedure Laterality Date  . APPENDECTOMY  1980's  . BREAST EXCISIONAL BIOPSY Left 1989  . COLONOSCOPY  last one 06-18-2015  . INGUINAL HERNIA REPAIR Left 05-10-2001  . LAPAROSCOPIC LYSIS OF ADHESIONS   01/17/2019  . OOPHORECTOMY Right 01/17/2019  . PERINEOPLASTY  01/17/2019  . SALPINGECTOMY Left 01/17/2019  . VAGINAL HYSTERECTOMY  1980's  . VAGINAL PROLAPSE REPAIR N/A 03/02/2016   Procedure: COLOPLAST ANTERIOR  VAULT REPAIR WITH AXIS DERMIS. SACROSPINUS FIXATION, AUGMENTATION WITH AXIS DERMIS;  Surgeon: Carolan Clines, MD;  Location: [facility];  Service: Urology;  Laterality: N/A;   Social History   Socioeconomic History  . Marital status: Single    Spouse name: Not on file  . Number of children: Not on file  . Years of education: Not on file  . Highest education level: Not on file  Occupational History  . Not on file  Social Needs  . Financial resource strain: Not on file  . Food insecurity:    Worry: Not on file    Inability: Not on file  . Transportation needs:    Medical: Not on file    Non-medical: Not on file  Tobacco Use  . Smoking status: Former Smoker    Years: 1.00    Types: Cigarettes    Last attempt to quit: 02/28/1996    Years since quitting: 22.9  . Smokeless tobacco: Never Used  Substance and Sexual Activity  . Alcohol use: Yes    Alcohol/week: 0.0 standard drinks    Comment: occasional  . Drug use: No  . Sexual activity: Not on file  Lifestyle  . Physical activity:    Days per week: Not on file  Minutes per session: Not on file  . Stress: Not on file  Relationships  . Social connections:    Talks on phone: Not on file    Gets together: Not on file    Attends religious service: Not on file    Active member of club or organization: Not on file    Attends meetings of clubs or organizations: Not on file    Relationship status: Not on file  Other Topics Concern  . Not on file  Social History Narrative   Single   Former Smoker  -  quit 10 to 11 years ago (light smoker)   Alcohol use-yes     2 children    Occupation: [employer]    Allergies  Allergen Reactions  . Penicillins Hives    Has patient had a PCN  reaction causing immediate rash, facial/tongue/throat swelling, SOB or lightheadedness with hypotension: Yes Has patient had a PCN reaction causing severe rash involving mucus membranes or skin necrosis: No Has patient had a PCN reaction that required hospitalization: No Has patient had a PCN reaction occurring within the last 10 years: No If all of the above answers are "NO", then may proceed with Cephalosporin use.    Family History  Problem Relation Age of Onset  . Aneurysm Mother 18       deceased secondary to brain aneurysm  . Liver cancer Father 56       deceased  . Diabetes Unknown        grandmother  . Colon cancer Neg Hx   . Stomach cancer Neg Hx   . Rectal cancer Neg Hx   . Esophageal cancer Neg Hx     Current Outpatient Medications (Endocrine & Metabolic):  .  methylPREDNISolone (MEDROL DOSEPAK) 4 MG TBPK tablet, Take as directed  Current Outpatient Medications (Cardiovascular):  .  lisinopril (PRINIVIL,ZESTRIL) 10 MG tablet, TAKE 1 TABLET(10 MG) BY MOUTH DAILY .  simvastatin (ZOCOR) 40 MG tablet, TAKE 1 TABLET BY MOUTH ONCE DAILY AT 6PM  Current Outpatient Medications (Respiratory):  .  fluticasone (FLONASE) 50 MCG/ACT nasal spray, SHAKE LIQUID AND USE 2 SPRAYS IN EACH NOSTRIL DAILY .  loratadine (CLARITIN) 10 MG tablet, Take 10 mg by mouth daily.  Current Outpatient Medications (Analgesics):  .  aspirin 81 MG tablet, Take 81 mg by mouth daily.   [name]  ibuprofen (ADVIL,MOTRIN) 800 MG tablet, TAKE 1 TABLET(800 MG) BY MOUTH EVERY 8 HOURS AS NEEDED   Current Outpatient Medications (Other):  [name]  Calcium Carbonate-Vitamin D (CALTRATE 600+D) 600-400 MG-UNIT per tablet, Take 1 tablet by mouth daily.   [name]  CRANBERRY PO, Take 2 tablets by mouth daily.  .  cyclobenzaprine (FLEXERIL) 5 MG tablet, take 1 tablet by mouth three times a day if needed for muscle spasm .  estradiol (ESTRACE) 0.1 MG/GM vaginal cream, U 0.5 GRAM VAGINALLY HS .  gabapentin (NEURONTIN) 100 MG capsule,  Take 2 capsules (200 mg total) by mouth at bedtime. [name]  glucose blood (ONETOUCH VERIO) test strip, USE TO TEST BLOOD GLUCOSE TWICE DAILY. [name]  Misc Natural Products (TART CHERRY ADVANCED) CAPS, Take 1 capsule by mouth daily. Glory Rosebush DELICA LANCETS FINE MISC, USE TO CHECK BLOOD SUGAR ONCE DAILY .  polyethylene glycol (MIRALAX / GLYCOLAX) packet, Take 17 g by mouth daily as needed for mild constipation. .  Tetrahydrozoline HCl (VISINE OP), Apply 1 drop to eye daily as needed (RED EYE). [name]  tolterodine (DETROL LA) 2 MG 24  hr capsule, Take 1 capsule (2 mg total) by mouth daily. .  TURMERIC PO, Take 1 tablet by mouth daily.     Past medical history, social, surgical and family history all reviewed in electronic medical record.  No pertanent information unless stated regarding to the chief complaint.   Review of Systems:  No headache, visual changes, nausea, vomiting, diarrhea, constipation, dizziness, abdominal pain, skin rash, fevers, chills, night sweats, weight loss, swollen lymph nodes, body aches, joint swelling,  chest pain, shortness of breath, mood changes.  Positive muscle aches  Objective  Blood pressure 140/70, pulse 99, height 5\' 4"  (1.626 m), weight 152 lb (68.9 kg), SpO2 97 %.   General: No apparent distress alert and oriented x3 mood and affect normal, dressed appropriately.  HEENT: Pupils equal, extraocular movements intact  Respiratory: Patient's speak in full sentences and does not appear short of breath  Cardiovascular: No lower extremity edema, non tender, no erythema  Skin: Warm dry intact with no signs of infection or rash on extremities or on axial skeleton.  Abdomen: Soft nontender  Neuro: Cranial nerves II through XII are intact, neurovascularly intact in all extremities with 2+ DTRs and 2+ pulses.  Lymph: No lymphadenopathy of posterior or anterior cervical chain or axillae bilaterally.  Gait severely antalgic MSK:   Knee: Bilateral valgus deformity noted.  Abnormal  thigh to calf ratio.  Tender to palpation over medial and PF joint line.  ROM full in flexion and extension and lower leg rotation. instability with valgus force.  painful patellar compression. Patellar glide with moderate crepitus. Patellar and quadriceps tendons unremarkable. Hamstring and quadriceps strength is normal.   After informed written and verbal consent, patient was seated on exam table. Right knee was prepped with alcohol swab and utilizing anterolateral approach, patient's right knee space was injected with 22 mg/mL of Monovisc (sodium hyaluronate) in a prefilled syringe was injected easily into the knee through a 22-gauge needle..Patient tolerated the procedure well without immediate complications.  After informed written and verbal consent, patient was seated on exam table. Left knee was prepped with alcohol swab and utilizing anterolateral approach, patient's left knee space was injected with 22 mg/mL of Monovisc (sodium hyaluronate) in a prefilled syringe was injected easily into the knee through a 22-gauge needle..Patient tolerated the procedure well without immediate complications.    Impression and Recommendations:     This case required medical decision making of moderate complexity. The above documentation has been reviewed and is accurate and complete [name], DO       Note: This dictation was prepared with Dragon dictation along with smaller phrase technology. Any transcriptional errors that result from this process are unintentional.

## 2019-02-15 ENCOUNTER — Ambulatory Visit: Payer: Medicare HMO

## 2019-02-20 ENCOUNTER — Telehealth: Payer: Self-pay

## 2019-02-20 ENCOUNTER — Other Ambulatory Visit: Payer: Self-pay

## 2019-02-20 DIAGNOSIS — M25552 Pain in left hip: Secondary | ICD-10-CM

## 2019-02-20 NOTE — Telephone Encounter (Signed)
Patient states that her left hip is bothering her. She would like to get an xray taken of the hip. Pain is from fall in January. Told patient I will discuss with Dr. Tamala Julian and return her call.

## 2019-02-20 NOTE — Telephone Encounter (Signed)
Copied from [place] [id number]. Topic: General - Other >> Feb 20, 2019  8:47 AM Antonieta Iba C wrote: Reason for CRM: pt called in requesting a call back from Dr. Thompson Caul CMA. Pt says that she was seen by Dr. Tamala Julian last week.   CB: [phone] or [phone]

## 2019-02-20 NOTE — Telephone Encounter (Signed)
Xray ordered as patient would like to proceed with one.

## 2019-02-20 NOTE — Telephone Encounter (Signed)
More likely from her back in my opinion she stated she had a fall in November per notes so story does not make sense.  I did back xrays showing severe arthritis.  If she wants another hip xray fine but not too concern it would be different or change management

## 2019-02-21 ENCOUNTER — Ambulatory Visit (INDEPENDENT_AMBULATORY_CARE_PROVIDER_SITE_OTHER)
Admission: RE | Admit: 2019-02-21 | Discharge: 2019-02-21 | Disposition: A | Discharge status: Home / Self Care | Payer: Medicare HMO | Source: Ambulatory Visit | Attending: Family Medicine | Admitting: Family Medicine

## 2019-02-21 DIAGNOSIS — M25552 Pain in left hip: Secondary | ICD-10-CM

## 2019-02-28 ENCOUNTER — Telehealth: Payer: Self-pay | Admitting: Adult Health

## 2019-02-28 NOTE — Telephone Encounter (Signed)
Copied from [place] [id number]. Topic: Quick Communication - See Telephone Encounter >> Feb 28, 2019  1:30 PM Rayann Heman wrote: CRM for notification. See Telephone encounter for: 02/28/19. Pt called and stated that she would like a call back from the nurse regarding knee pain. Please advise.

## 2019-02-28 NOTE — Telephone Encounter (Signed)
Spoke with pt, she received monovisc on 3/17 & states she is still in pain.

## 2019-03-01 ENCOUNTER — Ambulatory Visit (INDEPENDENT_AMBULATORY_CARE_PROVIDER_SITE_OTHER): Payer: Medicare HMO | Admitting: Family Medicine

## 2019-03-01 ENCOUNTER — Ambulatory Visit: Payer: Self-pay

## 2019-03-01 ENCOUNTER — Other Ambulatory Visit: Payer: Self-pay

## 2019-03-01 ENCOUNTER — Encounter: Payer: Self-pay | Admitting: Family Medicine

## 2019-03-01 VITALS — BP 116/82 | HR 107 | Ht 64.0 in | Wt 152.0 lb

## 2019-03-01 DIAGNOSIS — G8929 Other chronic pain: Secondary | ICD-10-CM | POA: Diagnosis not present

## 2019-03-01 DIAGNOSIS — M25552 Pain in left hip: Secondary | ICD-10-CM

## 2019-03-01 DIAGNOSIS — M25562 Pain in left knee: Secondary | ICD-10-CM | POA: Diagnosis not present

## 2019-03-01 DIAGNOSIS — R1032 Left lower quadrant pain: Secondary | ICD-10-CM

## 2019-03-01 MED ORDER — METHYLPREDNISOLONE ACETATE 80 MG/ML IJ SUSP
80.0000 mg | Freq: Once | INTRAMUSCULAR | Status: AC
  Administered 2019-03-01: 13:00:00 80 mg via INTRAMUSCULAR

## 2019-03-01 MED ORDER — KETOROLAC TROMETHAMINE 60 MG/2ML IM SOLN
60.0000 mg | Freq: Once | INTRAMUSCULAR | Status: AC
  Administered 2019-03-01: 60 mg via INTRAMUSCULAR

## 2019-03-01 MED ORDER — TIZANIDINE HCL 2 MG PO CAPS: 2.0000 mg | ORAL_CAPSULE | Freq: Three times a day (TID) | ORAL | 1 refills | Status: DC | PRN

## 2019-03-01 NOTE — Telephone Encounter (Signed)
Pt is coming in today for steroid injection.

## 2019-03-01 NOTE — Telephone Encounter (Signed)
I would give it more time but if in too much pain then I will see patient and can do a steroid injections

## 2019-03-01 NOTE — Patient Instructions (Addendum)
Good to see you  Ice 20 minutes 2 times daily. Usually after activity and before bed. Wear the compression daily for 2 weeks 2 injections today  Call on Monday

## 2019-03-01 NOTE — Assessment & Plan Note (Signed)
Left groin pain.  Patient does have a tear noted on ultrasound.  Does have some underlying arthritis but I do not think it is contributing.  Patient was put in a thigh compression and did respond well to it almost immediately.  Discussed wearing this regularly for the next 2 weeks.  Hold on any home exercise, injections given today.  We will have patient call on Monday and then either set up virtual visit or if no significant improvement will need to consider the possibility of her coming back into the office.  Would like to avoid that with the coronavirus outbreak at the moment.  Patient is agreement with the plan.

## 2019-03-01 NOTE — Addendum Note (Signed)
Addended by: [name] on: 03/01/2019 12:55 PM   Modules accepted: Orders

## 2019-03-01 NOTE — Progress Notes (Signed)
[name] [address], [place] [address] Phone: [phone] Subjective:    I'm seeing this patient by the request  of:    CC: Left knee and hip pain  GQQ:[id number]  Cathy [name] is a 73 y.o. female coming in with complaint of left knee pain. Pain radiates from left knee to the left hip. Patient tripped over a cord at home on Saturday.   Patient did not come in and have x-rays done earlier this week.  X-rays were independently visualized by me.  X-rays did show some arthritic changes of the left hip.  Patient states that the pain seems to be on the lateral aspect as well as the groin itself.  States it will hurts more with any type of ambulation.  Rates the severity of pain is 9 out of 10.     Past Medical History:  Diagnosis Date  . Chronic constipation   . Depression   . Diabetes mellitus type II   . DJD (degenerative joint disease)    knees  . Female cystocele   . GERD (gastroesophageal reflux disease)   . History of esophagitis   . History of MRSA infection    recurrent carbuncle  . History of recurrent UTIs   . Hyperlipidemia   . Hypertension   . Urgency of urination    Past Surgical History:  Procedure Laterality Date  . APPENDECTOMY  1980's  . BREAST EXCISIONAL BIOPSY Left 1989  . COLONOSCOPY  last one 06-18-2015  . INGUINAL HERNIA REPAIR Left 05-10-2001  . LAPAROSCOPIC LYSIS OF ADHESIONS  01/17/2019  . OOPHORECTOMY Right 01/17/2019  . PERINEOPLASTY  01/17/2019  . SALPINGECTOMY Left 01/17/2019  . VAGINAL HYSTERECTOMY  1980's  . VAGINAL PROLAPSE REPAIR N/A 03/02/2016   Procedure: COLOPLAST ANTERIOR  VAULT REPAIR WITH AXIS DERMIS. SACROSPINUS FIXATION, AUGMENTATION WITH AXIS DERMIS;  Surgeon: Carolan Clines, MD;  Location: [facility];  Service: Urology;  Laterality: N/A;   Social History   Socioeconomic History  . Marital status: Single    Spouse name: Not on file  . Number of children: Not on  file  . Years of education: Not on file  . Highest education level: Not on file  Occupational History  . Not on file  Social Needs  . Financial resource strain: Not on file  . Food insecurity:    Worry: Not on file    Inability: Not on file  . Transportation needs:    Medical: Not on file    Non-medical: Not on file  Tobacco Use  . Smoking status: Former Smoker    Years: 1.00    Types: Cigarettes    Last attempt to quit: 02/28/1996    Years since quitting: 23.0  . Smokeless tobacco: Never Used  Substance and Sexual Activity  . Alcohol use: Yes    Alcohol/week: 0.0 standard drinks    Comment: occasional  . Drug use: No  . Sexual activity: Not on file  Lifestyle  . Physical activity:    Days per week: Not on file    Minutes per session: Not on file  . Stress: Not on file  Relationships  . Social connections:    Talks on phone: Not on file    Gets together: Not on file    Attends religious service: Not on file    Active member of club or organization: Not on file    Attends meetings of clubs or organizations: Not on file  Relationship status: Not on file  Other Topics Concern  . Not on file  Social History Narrative   Single   Former Smoker  -  quit 10 to 11 years ago (light smoker)   Alcohol use-yes     2 children    Occupation: [employer]    Allergies  Allergen Reactions  . Penicillins Hives    Has patient had a PCN reaction causing immediate rash, facial/tongue/throat swelling, SOB or lightheadedness with hypotension: Yes Has patient had a PCN reaction causing severe rash involving mucus membranes or skin necrosis: No Has patient had a PCN reaction that required hospitalization: No Has patient had a PCN reaction occurring within the last 10 years: No If all of the above answers are "NO", then may proceed with Cephalosporin use.    Family History  Problem Relation Age of Onset  . Aneurysm Mother 68       deceased secondary to brain aneurysm   . Liver cancer Father 34       deceased  . Diabetes Unknown        grandmother  . Colon cancer Neg Hx   . Stomach cancer Neg Hx   . Rectal cancer Neg Hx   . Esophageal cancer Neg Hx     Current Outpatient Medications (Endocrine & Metabolic):  .  methylPREDNISolone (MEDROL DOSEPAK) 4 MG TBPK tablet, Take as directed  Current Outpatient Medications (Cardiovascular):  .  lisinopril (PRINIVIL,ZESTRIL) 10 MG tablet, TAKE 1 TABLET(10 MG) BY MOUTH DAILY .  simvastatin (ZOCOR) 40 MG tablet, TAKE 1 TABLET BY MOUTH ONCE DAILY AT 6PM  Current Outpatient Medications (Respiratory):  .  fluticasone (FLONASE) 50 MCG/ACT nasal spray, SHAKE LIQUID AND USE 2 SPRAYS IN EACH NOSTRIL DAILY .  loratadine (CLARITIN) 10 MG tablet, Take 10 mg by mouth daily.  Current Outpatient Medications (Analgesics):  .  aspirin 81 MG tablet, Take 81 mg by mouth daily.   [name]  ibuprofen (ADVIL,MOTRIN) 800 MG tablet, TAKE 1 TABLET(800 MG) BY MOUTH EVERY 8 HOURS AS NEEDED   Current Outpatient Medications (Other):  [name]  Calcium Carbonate-Vitamin D (CALTRATE 600+D) 600-400 MG-UNIT per tablet, Take 1 tablet by mouth daily.   [name]  CRANBERRY PO, Take 2 tablets by mouth daily.  .  cyclobenzaprine (FLEXERIL) 5 MG tablet, take 1 tablet by mouth three times a day if needed for muscle spasm .  estradiol (ESTRACE) 0.1 MG/GM vaginal cream, U 0.5 GRAM VAGINALLY HS .  gabapentin (NEURONTIN) 100 MG capsule, Take 2 capsules (200 mg total) by mouth at bedtime. [name]  glucose blood (ONETOUCH VERIO) test strip, USE TO TEST BLOOD GLUCOSE TWICE DAILY. [name]  Misc Natural Products (TART CHERRY ADVANCED) CAPS, Take 1 capsule by mouth daily. Glory Rosebush DELICA LANCETS FINE MISC, USE TO CHECK BLOOD SUGAR ONCE DAILY .  polyethylene glycol (MIRALAX / GLYCOLAX) packet, Take 17 g by mouth daily as needed for mild constipation. .  Tetrahydrozoline HCl (VISINE OP), Apply 1 drop to eye daily as needed (RED EYE). [name]  tolterodine (DETROL LA) 2 MG 24 hr capsule, Take 1  capsule (2 mg total) by mouth daily. .  TURMERIC PO, Take 1 tablet by mouth daily.     Past medical history, social, surgical and family history all reviewed in electronic medical record.  No pertanent information unless stated regarding to the chief complaint.   Review of Systems:  No headache, visual changes, nausea, vomiting, diarrhea, constipation, dizziness, abdominal pain, skin rash, fevers, chills,  night sweats, weight loss, swollen lymph nodes, body aches, joint swelling,  chest pain, shortness of breath, mood changes.  Positive muscle aches  Objective  Blood pressure 116/82, pulse (!) 107, height 5\' 4"  (1.626 m), weight 152 lb (68.9 kg), SpO2 98 %. Systems examined below as of    General: No apparent distress alert and oriented x3 mood and affect normal, dressed appropriately.  HEENT: Pupils equal, extraocular movements intact  Respiratory: Patient's speak in full sentences and does not appear short of breath  Cardiovascular: No lower extremity edema, non tender, no erythema  Skin: Warm dry intact with no signs of infection or rash on extremities or on axial skeleton.  Abdomen: Soft nontender  Neuro: Cranial nerves II through XII are intact, neurovascularly intact in all extremities with 2+ DTRs and 2+ pulses.  Lymph: No lymphadenopathy of posterior or anterior cervical chain or axillae bilaterally.  Gait antalgic MSK: Arthritic changes of multiple joints Left hip exam shows decreased range of motion with only 5 degrees of internal rotation.  Pain though seems to be more into the musculature than the hip joint itself.  Minimal pain over the lateral aspect of the hip.  Negative straight leg test.  Neurovascular intact distally.  Worsening pain with resisted adduction.  Limited musculoskeletal ultrasound was performed and interpreted by [name]  Limited ultrasound shows that patient does have a small intrasubstance tear near the origin of the adductor magnus muscle.   Patient does not have arthritis.  No cortical defect that is appreciated of the proximal femur.   Impression and Recommendations:     This case required medical decision making of moderate complexity. The above documentation has been reviewed and is accurate and complete [name], DO       Note: This dictation was prepared with Dragon dictation along with smaller phrase technology. Any transcriptional errors that result from this process are unintentional.

## 2019-03-07 ENCOUNTER — Other Ambulatory Visit: Payer: Self-pay | Admitting: *Deleted

## 2019-03-07 DIAGNOSIS — M25552 Pain in left hip: Secondary | ICD-10-CM

## 2019-03-07 DIAGNOSIS — M4807 Spinal stenosis, lumbosacral region: Secondary | ICD-10-CM

## 2019-03-15 ENCOUNTER — Ambulatory Visit: Payer: Medicare HMO

## 2019-03-15 DIAGNOSIS — R399 Unspecified symptoms and signs involving the genitourinary system: Secondary | ICD-10-CM | POA: Diagnosis not present

## 2019-03-19 ENCOUNTER — Other Ambulatory Visit: Payer: Self-pay | Admitting: Adult Health

## 2019-03-21 ENCOUNTER — Other Ambulatory Visit: Payer: Self-pay | Admitting: Family Medicine

## 2019-03-24 ENCOUNTER — Other Ambulatory Visit: Payer: Self-pay

## 2019-03-24 ENCOUNTER — Ambulatory Visit
Admission: RE | Admit: 2019-03-24 | Discharge: 2019-03-24 | Disposition: A | Discharge status: Home / Self Care | Payer: Medicare HMO | Source: Ambulatory Visit | Attending: Family Medicine | Admitting: Family Medicine

## 2019-03-24 DIAGNOSIS — M25562 Pain in left knee: Principal | ICD-10-CM

## 2019-03-24 DIAGNOSIS — M25552 Pain in left hip: Secondary | ICD-10-CM | POA: Diagnosis not present

## 2019-03-24 DIAGNOSIS — G8929 Other chronic pain: Secondary | ICD-10-CM

## 2019-03-24 DIAGNOSIS — M48061 Spinal stenosis, lumbar region without neurogenic claudication: Secondary | ICD-10-CM | POA: Diagnosis not present

## 2019-03-24 DIAGNOSIS — M4807 Spinal stenosis, lumbosacral region: Secondary | ICD-10-CM

## 2019-03-24 MED ORDER — GADOBENATE DIMEGLUMINE 529 MG/ML IV SOLN
13.0000 mL | Freq: Once | INTRAVENOUS | Status: AC | PRN
  Administered 2019-03-24: 13 mL via INTRAVENOUS

## 2019-04-05 ENCOUNTER — Ambulatory Visit (INDEPENDENT_AMBULATORY_CARE_PROVIDER_SITE_OTHER): Payer: Medicare HMO | Admitting: Adult Health

## 2019-04-05 ENCOUNTER — Encounter: Payer: Self-pay | Admitting: Adult Health

## 2019-04-05 ENCOUNTER — Ambulatory Visit (INDEPENDENT_AMBULATORY_CARE_PROVIDER_SITE_OTHER): Payer: Medicare HMO | Admitting: Family Medicine

## 2019-04-05 ENCOUNTER — Other Ambulatory Visit: Payer: Self-pay

## 2019-04-05 ENCOUNTER — Ambulatory Visit: Payer: Self-pay

## 2019-04-05 VITALS — BP 138/84 | HR 106 | Ht 64.0 in | Wt 153.0 lb

## 2019-04-05 DIAGNOSIS — M25552 Pain in left hip: Secondary | ICD-10-CM

## 2019-04-05 DIAGNOSIS — N3000 Acute cystitis without hematuria: Secondary | ICD-10-CM

## 2019-04-05 DIAGNOSIS — R1032 Left lower quadrant pain: Secondary | ICD-10-CM

## 2019-04-05 MED ORDER — MELOXICAM 7.5 MG PO TABS: 7.5000 mg | ORAL_TABLET | Freq: Every day | ORAL | 0 refills | Status: DC

## 2019-04-05 MED ORDER — CIPROFLOXACIN HCL 500 MG PO TABS: 500.0000 mg | ORAL_TABLET | Freq: Two times a day (BID) | ORAL | 0 refills | Status: DC

## 2019-04-05 NOTE — Progress Notes (Signed)
Virtual Visit via Telephone Note  I connected with [name] on 04/05/19 at  2:30 PM EDT by telephone and verified that I am speaking with the correct person using two identifiers.   I discussed the limitations, risks, security and privacy concerns of performing an evaluation and management service by telephone and the availability of in person appointments. I also discussed with the patient that there may be a patient responsible charge related to this service. The patient expressed understanding and agreed to proceed.  Location patient: home Location provider: work or home office Participants present for the call: patient, provider Patient did not have a visit in the prior 7 days to address this/these issue(s).   History of Present Illness: 73 year old female who  has a past medical history of Chronic constipation, Depression, Diabetes mellitus type II, DJD (degenerative joint disease), Female cystocele, GERD (gastroesophageal reflux disease), History of esophagitis, History of MRSA infection, History of recurrent UTIs, Hyperlipidemia, Hypertension, and Urgency of urination.  She is being evaluated today for 2 separate complaints  1.  UTI.  She has had multiple UTIs in the past the last being in December 2019.  She reports today approximately 1 week of frequency, urgency, dysuria, and lower pelvic pressure.  She denies any hematuria, fevers, chills, or low back pain past baseline.  2.  Chronic left hip pain-recent x-ray done on 03/24/2019 by Dr. Tamala Julian.  This showed unchanged moderate to severe left hip joint space narrowing narrowing superiorly.  She is been using Flexeril and BC powder which does not relieve the pain but helps.  Past she is also tried Tylenol arthritis but did not get any relief from this she is wondering if there is something else she can use.     Observations/Objective: Patient sounds cheerful and well on the phone. I do not appreciate any SOB. Speech and thought  processing are grossly intact. Patient reported vitals:  Assessment and Plan: 1. Acute cystitis without hematuria -For likely UTI patient symptoms.  Advised to stay hydrated take Cipro twice daily x3 days.  Follow-up instructions given - ciprofloxacin (CIPRO) 500 MG tablet; Take 1 tablet (500 mg total) by mouth 2 (two) times daily.  Dispense: 6 tablet; Refill: 0  2. Left hip pain -Trial her on  Mobic daily as needed.  She was advised to follow-up if she does not get any relief from this medication - meloxicam (MOBIC) 7.5 MG tablet; Take 1 tablet (7.5 mg total) by mouth daily.  Dispense: 90 tablet; Refill: 0   Follow Up Instructions:  I did not refer this patient for an OV in the next 24 hours for this/these issue(s).  I discussed the assessment and treatment plan with the patient. The patient was provided an opportunity to ask questions and all were answered. The patient agreed with the plan and demonstrated an understanding of the instructions.   The patient was advised to call back or seek an in-person evaluation if the symptoms worsen or if the condition fails to improve as anticipated.  I provided 30 minutes of non-face-to-face time during this encounter.   [name], NP

## 2019-04-05 NOTE — Progress Notes (Signed)
[name] [place], [place] [address] Phone: [phone] Subjective:   Cathy [name], am serving as a scribe for Dr. Hulan Saas.  I'm seeing this patient by the request  of:    CC: Patient left groin pain follow-up  [id number]   03/01/2019: Left groin pain.  Patient does have a tear noted on ultrasound.  Does have some underlying arthritis but I do not think it is contributing.  Patient was put in a thigh compression and did respond well to it almost immediately.  Discussed wearing this regularly for the next 2 weeks.  Hold on any home exercise, injections given today.  We will have patient call on Monday and then either set up virtual visit or if [name] significant improvement will need to consider the possibility of her coming back into the office.  Would like to avoid that with the coronavirus outbreak at the moment.  Patient is agreement with the plan. Update 04/05/2019: Cathy [name] is a 73 y.o. female coming in with complaint of left groin pain. Patient states that her pain has improved. She does still have some pain with hip flexion and when she walks for prolonged periods. Does note that she can have sharp pain in the lateral hip that will wake her up at night. Has been using topical analgesic for pain relief.   Also notes that since she had a knee injection in her right knee that she has been having nerve pain on medial aspect of knee that radiates into her calf. Last injection was 02/07/2019. Monovisc given.    Past Medical History:  Diagnosis Date  . Chronic constipation   . Depression   . Diabetes mellitus type II   . DJD (degenerative joint disease)    knees  . Female cystocele   . GERD (gastroesophageal reflux disease)   . History of esophagitis   . History of MRSA infection    recurrent carbuncle  . History of recurrent UTIs   . Hyperlipidemia   . Hypertension   . Urgency of urination    Past Surgical History:   Procedure Laterality Date  . APPENDECTOMY  1980's  . BREAST EXCISIONAL BIOPSY Left 1989  . COLONOSCOPY  last one 06-18-2015  . INGUINAL HERNIA REPAIR Left 05-10-2001  . LAPAROSCOPIC LYSIS OF ADHESIONS  01/17/2019  . OOPHORECTOMY Right 01/17/2019  . PERINEOPLASTY  01/17/2019  . SALPINGECTOMY Left 01/17/2019  . VAGINAL HYSTERECTOMY  1980's  . VAGINAL PROLAPSE REPAIR N/A 03/02/2016   Procedure: COLOPLAST ANTERIOR  VAULT REPAIR WITH AXIS DERMIS. SACROSPINUS FIXATION, AUGMENTATION WITH AXIS DERMIS;  Surgeon: Carolan Clines, MD;  Location: [facility];  Service: Urology;  Laterality: N/A;   Social History   Socioeconomic History  . Marital status: Single    Spouse name: Not on file  . Number of children: Not on file  . Years of education: Not on file  . Highest education level: Not on file  Occupational History  . Not on file  Social Needs  . Financial resource strain: Not on file  . Food insecurity:    Worry: Not on file    Inability: Not on file  . Transportation needs:    Medical: Not on file    Non-medical: Not on file  Tobacco Use  . Smoking status: Former Smoker    Years: 1.00    Types: Cigarettes    Last attempt to quit: 02/28/1996    Years since quitting: 23.1  .  Smokeless tobacco: Never Used  Substance and Sexual Activity  . Alcohol use: Yes    Alcohol/week: 0.0 standard drinks    Comment: occasional  . Drug use: [name]  . Sexual activity: Not on file  Lifestyle  . Physical activity:    Days per week: Not on file    Minutes per session: Not on file  . Stress: Not on file  Relationships  . Social connections:    Talks on phone: Not on file    Gets together: Not on file    Attends religious service: Not on file    Active member of club or organization: Not on file    Attends meetings of clubs or organizations: Not on file    Relationship status: Not on file  Other Topics Concern  . Not on file  Social History Narrative   Single   Former  Smoker  -  quit 10 to 11 years ago (light smoker)   Alcohol use-yes     2 children    Occupation: [employer]    Allergies  Allergen Reactions  . Penicillins Hives    Has patient had a PCN reaction causing immediate rash, facial/tongue/throat swelling, SOB or lightheadedness with hypotension: Yes Has patient had a PCN reaction causing severe rash involving mucus membranes or skin necrosis: [name] Has patient had a PCN reaction that required hospitalization: [name] Has patient had a PCN reaction occurring within the last 10 years: [name] If all of the above answers are "[name]", then may proceed with Cephalosporin use.    Family History  Problem Relation Age of Onset  . Aneurysm Mother 82       deceased secondary to brain aneurysm  . Liver cancer Father 50       deceased  . Diabetes Unknown        grandmother  . Colon cancer Neg Hx   . Stomach cancer Neg Hx   . Rectal cancer Neg Hx   . Esophageal cancer Neg Hx      Current Outpatient Medications (Cardiovascular):  .  lisinopril (PRINIVIL,ZESTRIL) 10 MG tablet, TAKE 1 TABLET(10 MG) BY MOUTH DAILY .  simvastatin (ZOCOR) 40 MG tablet, TAKE 1 TABLET BY MOUTH ONCE DAILY AT 6PM  Current Outpatient Medications (Respiratory):  .  fluticasone (FLONASE) 50 MCG/ACT nasal spray, SHAKE LIQUID AND USE 2 SPRAYS IN EACH NOSTRIL DAILY .  loratadine (CLARITIN) 10 MG tablet, Take 10 mg by mouth daily.  Current Outpatient Medications (Analgesics):  .  aspirin 81 MG tablet, Take 81 mg by mouth daily.   .  meloxicam (MOBIC) 7.5 MG tablet, Take 1 tablet (7.5 mg total) by mouth daily.   Current Outpatient Medications (Other):  [name]  Calcium Carbonate-Vitamin D (CALTRATE 600+D) 600-400 MG-UNIT per tablet, Take 1 tablet by mouth daily.   [name]  CRANBERRY PO, Take 2 tablets by mouth daily.  .  cyclobenzaprine (FLEXERIL) 5 MG tablet, TAKE 1 TABLET BY MOUTH THREE TIMES DAILY AS NEEDED FOR MUSCLE SPASM .  estradiol (ESTRACE) 0.1 MG/GM vaginal cream, U 0.5  GRAM VAGINALLY HS .  gabapentin (NEURONTIN) 100 MG capsule, Take 2 capsules (200 mg total) by mouth at bedtime. [name]  glucose blood (ONETOUCH VERIO) test strip, USE TO TEST BLOOD GLUCOSE TWICE DAILY. [name]  Misc Natural Products (TART CHERRY ADVANCED) CAPS, Take 1 capsule by mouth daily. Glory Rosebush DELICA LANCETS FINE MISC, USE TO CHECK BLOOD SUGAR ONCE DAILY .  polyethylene glycol (MIRALAX / GLYCOLAX) packet, Take  17 g by mouth daily as needed for mild constipation. .  Tetrahydrozoline HCl (VISINE OP), Apply 1 drop to eye daily as needed (RED EYE). [name]  tolterodine (DETROL LA) 2 MG 24 hr capsule, Take 1 capsule (2 mg total) by mouth daily. .  TURMERIC PO, Take 1 tablet by mouth daily.  .  ciprofloxacin (CIPRO) 500 MG tablet, Take 1 tablet (500 mg total) by mouth 2 (two) times daily.    Past medical history, social, surgical and family history all reviewed in electronic medical record.  [name] pertanent information unless stated regarding to the chief complaint.   Review of Systems:  [name] headache, visual changes, nausea, vomiting, diarrhea, constipation, dizziness, abdominal pain, skin rash, fevers, chills, night sweats, weight loss, swollen lymph nodes, body aches, joint swelling,  chest pain, shortness of breath, mood changes.  Positive muscle aches Objective  Blood pressure 138/84, pulse (!) 106, height 5\' 4"  (1.626 m), weight 153 lb (69.4 kg), SpO2 99 %.   General: [name] apparent distress alert and oriented x3 mood and affect normal, dressed appropriately.  HEENT: Pupils equal, extraocular movements intact  Respiratory: Patient's speak in full sentences and does not appear short of breath  Cardiovascular: [name] lower extremity edema, non tender, [name] erythema  Skin: Warm dry intact with [name] signs of infection or rash on extremities or on axial skeleton.  Abdomen: Soft nontender  Neuro: Cranial nerves II through XII are intact, neurovascularly intact in all extremities with 2+ DTRs and 2+ pulses.  Lymph: [name]  lymphadenopathy of posterior or anterior cervical chain or axillae bilaterally.  Gait severely antalgic MSK:  Non tender with full range of motion and good stability and symmetric strength and tone of shoulders, elbows, wrist,and ankles bilaterally.   Patient's left hip has decreased range of motion in all planes.  Patient though does have some mild strength that is improved from previous exam with the adductor as well as hip flexor.  Patient does even have some mild crepitus with range of motion.  Negative straight leg test.  Knee arthritis noted bilaterally.  Instability with varus force.  Valgus deformity with abnormal thigh to calf ratio.  Limited musculoskeletal ultrasound was performed and interpreted by .mee  Limited ultrasound of patient's groin area shows the adductor having some interval healing with scar tissue formation.  Seems to be somewhat improved from previous exam.  Patient does have significant underlying arthritic changes noted as well though.   Impression and Recommendations:     This case required medical decision making of moderate complexity. The above documentation has been reviewed and is accurate and complete [name], DO       Note: This dictation was prepared with Dragon dictation along with smaller phrase technology. Any transcriptional errors that result from this process are unintentional.

## 2019-04-05 NOTE — Patient Instructions (Signed)
Good see you  Wear the compression daily for another 1-2 weeks then with a lot of walking for another 2 weeks.  See me again in 6 weeks.  Be safe

## 2019-04-05 NOTE — Assessment & Plan Note (Addendum)
Patient did have a groin injury but seems to be improving.  Does have underlying arthritis that I think is also contributing though.  We discussed with patient in great length about this.  Patient wants to continue with conservative therapy.  Patient does not want to do anything about her hip at this moment.  Patient did not want an injection or any type of replacement.  We did discuss patient's knee arthritis as well.  Patient has been doing somewhat good but did not make significant provement with the viscosupplementation.  Patient also did not want to do any other type of injections today.  Discussed icing regimen and home exercise.  Follow-up again 6 weeks spent  25 minutes with patient face-to-face and had greater than 50% of counseling including as described above in assessment and plan.

## 2019-04-10 ENCOUNTER — Telehealth: Payer: Self-pay | Admitting: Adult Health

## 2019-04-10 NOTE — Telephone Encounter (Signed)
Copied from [place] [id number]. Topic: Quick Communication - Rx Refill/Question >> Apr 10, 2019 11:48 AM Pauline Good wrote: Medication: Ciprofloxacin 500mg    Has the patient contacted their pharmacy? no (Agent: If no, request that the patient contact the pharmacy for the refill.)no more refills (Agent: If yes, when and what did the pharmacy advise?)  Preferred Pharmacy (with phone number or street name): [facility]  Agent: Please be advised that RX refills may take up to 3 business days. We ask that you follow-up with your pharmacy.

## 2019-04-10 NOTE — Telephone Encounter (Signed)
[name], would you like to do urinalysis before prescribing more medication?

## 2019-04-11 ENCOUNTER — Other Ambulatory Visit: Payer: Self-pay | Admitting: Family Medicine

## 2019-04-11 DIAGNOSIS — N3000 Acute cystitis without hematuria: Secondary | ICD-10-CM

## 2019-04-11 NOTE — Telephone Encounter (Signed)
Pt is now scheduled for lab appt.

## 2019-04-11 NOTE — Telephone Encounter (Signed)
Yes please

## 2019-04-11 NOTE — Telephone Encounter (Signed)
Pt stated if she can get a call back from [place]. Please call pt. She forgot to discuss something with you

## 2019-04-12 ENCOUNTER — Ambulatory Visit (INDEPENDENT_AMBULATORY_CARE_PROVIDER_SITE_OTHER): Payer: Medicare HMO | Admitting: Adult Health

## 2019-04-12 ENCOUNTER — Other Ambulatory Visit (INDEPENDENT_AMBULATORY_CARE_PROVIDER_SITE_OTHER): Payer: Medicare HMO

## 2019-04-12 ENCOUNTER — Other Ambulatory Visit: Payer: Self-pay

## 2019-04-12 ENCOUNTER — Encounter: Payer: Self-pay | Admitting: Adult Health

## 2019-04-12 VITALS — BP 140/82 | Temp 97.8°F | Wt 154.0 lb

## 2019-04-12 DIAGNOSIS — R8289 Other abnormal findings on cytological and histological examination of urine: Secondary | ICD-10-CM

## 2019-04-12 DIAGNOSIS — H6121 Impacted cerumen, right ear: Secondary | ICD-10-CM

## 2019-04-12 DIAGNOSIS — N3000 Acute cystitis without hematuria: Secondary | ICD-10-CM

## 2019-04-12 DIAGNOSIS — R350 Frequency of micturition: Secondary | ICD-10-CM

## 2019-04-12 LAB — URINALYSIS, ROUTINE W REFLEX MICROSCOPIC
Bilirubin Urine: NEGATIVE
Hgb urine dipstick: NEGATIVE
Ketones, ur: NEGATIVE
Nitrite: NEGATIVE
Specific Gravity, Urine: 1.01 (ref 1.000–1.030)
Total Protein, Urine: NEGATIVE
Urine Glucose: NEGATIVE
Urobilinogen, UA: 0.2 (ref 0.0–1.0)
pH: 6 (ref 5.0–8.0)

## 2019-04-12 NOTE — Addendum Note (Signed)
Addended by: Miles Costain T on: 04/12/2019 10:58 AM   Modules accepted: Orders

## 2019-04-12 NOTE — Progress Notes (Signed)
Subjective:    Patient ID: Cathy [name], female    DOB: 1946-02-16, 73 y.o.   MRN: [id number]  HPI 73 year old female who  has a past medical history of Chronic constipation, Depression, Diabetes mellitus type II, DJD (degenerative joint disease), Female cystocele, GERD (gastroesophageal reflux disease), History of esophagitis, History of MRSA infection, History of recurrent UTIs, Hyperlipidemia, Hypertension, and Urgency of urination.  She is being evaluated today for concern of "right ear fullness".  Reports that she was talking on the phone yesterday and noticed that she could not hear.  She denies pain or discomfort, she has not had any drainage.  Also reports that she continues to have urinary frequency and urgency, with mild dysuria.  She was treated 7 days ago via video visit for suspected UTI with Cipro x3 days.  Denies fevers, chills, or feeling acutely ill.  Review of Systems See HPI   Past Medical History:  Diagnosis Date  . Chronic constipation   . Depression   . Diabetes mellitus type II   . DJD (degenerative joint disease)    knees  . Female cystocele   . GERD (gastroesophageal reflux disease)   . History of esophagitis   . History of MRSA infection    recurrent carbuncle  . History of recurrent UTIs   . Hyperlipidemia   . Hypertension   . Urgency of urination     Social History   Socioeconomic History  . Marital status: Single    Spouse name: Not on file  . Number of children: Not on file  . Years of education: Not on file  . Highest education level: Not on file  Occupational History  . Not on file  Social Needs  . Financial resource strain: Not on file  . Food insecurity:    Worry: Not on file    Inability: Not on file  . Transportation needs:    Medical: Not on file    Non-medical: Not on file  Tobacco Use  . Smoking status: Former Smoker    Years: 1.00    Types: Cigarettes    Last attempt to quit: 02/28/1996    Years since quitting: 23.1   . Smokeless tobacco: Never Used  Substance and Sexual Activity  . Alcohol use: Yes    Alcohol/week: 0.0 standard drinks    Comment: occasional  . Drug use: No  . Sexual activity: Not on file  Lifestyle  . Physical activity:    Days per week: Not on file    Minutes per session: Not on file  . Stress: Not on file  Relationships  . Social connections:    Talks on phone: Not on file    Gets together: Not on file    Attends religious service: Not on file    Active member of club or organization: Not on file    Attends meetings of clubs or organizations: Not on file    Relationship status: Not on file  . Intimate partner violence:    Fear of current or ex partner: Not on file    Emotionally abused: Not on file    Physically abused: Not on file    Forced sexual activity: Not on file  Other Topics Concern  . Not on file  Social History Narrative   Single   Former Smoker  -  quit 10 to 11 years ago (light smoker)   Alcohol use-yes     2 children    Occupation: [employer]     Past Surgical History:  Procedure Laterality Date  . APPENDECTOMY  1980's  . BREAST EXCISIONAL BIOPSY Left 1989  . COLONOSCOPY  last one 06-18-2015  . INGUINAL HERNIA REPAIR Left 05-10-2001  . LAPAROSCOPIC LYSIS OF ADHESIONS  01/17/2019  . OOPHORECTOMY Right 01/17/2019  . PERINEOPLASTY  01/17/2019  . SALPINGECTOMY Left 01/17/2019  . VAGINAL HYSTERECTOMY  1980's  . VAGINAL PROLAPSE REPAIR N/A 03/02/2016   Procedure: COLOPLAST ANTERIOR  VAULT REPAIR WITH AXIS DERMIS. SACROSPINUS FIXATION, AUGMENTATION WITH AXIS DERMIS;  Surgeon: Carolan Clines, MD;  Location: [facility];  Service: Urology;  Laterality: N/A;    Family History  Problem Relation Age of Onset  . Aneurysm Mother 69       deceased secondary to brain aneurysm  . Liver cancer Father 38       deceased  . Diabetes Unknown        grandmother  . Colon cancer Neg Hx   . Stomach cancer Neg Hx   . Rectal  cancer Neg Hx   . Esophageal cancer Neg Hx     Allergies  Allergen Reactions  . Penicillins Hives    Has patient had a PCN reaction causing immediate rash, facial/tongue/throat swelling, SOB or lightheadedness with hypotension: Yes Has patient had a PCN reaction causing severe rash involving mucus membranes or skin necrosis: No Has patient had a PCN reaction that required hospitalization: No Has patient had a PCN reaction occurring within the last 10 years: No If all of the above answers are "NO", then may proceed with Cephalosporin use.     Current Outpatient Medications on File Prior to Visit  Medication Sig Dispense Refill  . aspirin 81 MG tablet Take 81 mg by mouth daily.      . Calcium Carbonate-Vitamin D (CALTRATE 600+D) 600-400 MG-UNIT per tablet Take 1 tablet by mouth daily.      [name] CRANBERRY PO Take 2 tablets by mouth daily.     . cyclobenzaprine (FLEXERIL) 5 MG tablet TAKE 1 TABLET BY MOUTH THREE TIMES DAILY AS NEEDED FOR MUSCLE SPASM 30 tablet 1  . estradiol (ESTRACE) 0.1 MG/GM vaginal cream U 0.5 GRAM VAGINALLY HS  3  . fluticasone (FLONASE) 50 MCG/ACT nasal spray SHAKE LIQUID AND USE 2 SPRAYS IN EACH NOSTRIL DAILY 16 g 6  . gabapentin (NEURONTIN) 100 MG capsule Take 2 capsules (200 mg total) by mouth at bedtime. 180 capsule 3  . glucose blood (ONETOUCH VERIO) test strip USE TO TEST BLOOD GLUCOSE TWICE DAILY. 200 each 3  . lisinopril (PRINIVIL,ZESTRIL) 10 MG tablet TAKE 1 TABLET(10 MG) BY MOUTH DAILY 90 tablet 2  . loratadine (CLARITIN) 10 MG tablet Take 10 mg by mouth daily.    . meloxicam (MOBIC) 7.5 MG tablet Take 1 tablet (7.5 mg total) by mouth daily. 90 tablet 0  . Misc Natural Products (TART CHERRY ADVANCED) CAPS Take 1 capsule by mouth daily.    Glory Rosebush DELICA LANCETS FINE MISC USE TO CHECK BLOOD SUGAR ONCE DAILY 100 each 3  . polyethylene glycol (MIRALAX / GLYCOLAX) packet Take 17 g by mouth daily as needed for mild constipation.    . simvastatin (ZOCOR) 40 MG  tablet TAKE 1 TABLET BY MOUTH ONCE DAILY AT 6PM 90 tablet 3  . Tetrahydrozoline HCl (VISINE OP) Apply 1 drop to eye daily as needed (RED EYE).    [name] tolterodine (DETROL LA) 2 MG 24 hr capsule Take 1 capsule (2 mg total) by  mouth daily. 30 capsule 2  . TURMERIC PO Take 1 tablet by mouth daily.     . [DISCONTINUED] valsartan (DIOVAN) 160 MG tablet Take 1/2 tablet by mouth once daily 30 tablet 5   No current facility-administered medications on file prior to visit.     BP 140/82   Temp 97.8 F (36.6 C)   Wt 154 lb (69.9 kg)   BMI 26.43 kg/m       Objective:   Physical Exam Vitals signs and nursing note reviewed.  Constitutional:      Appearance: Normal appearance.  HENT:     Right Ear: External ear normal. There is impacted cerumen.     Left Ear: Tympanic membrane, ear canal and external ear normal. There is no impacted cerumen.  Abdominal:     General: Abdomen is flat. Bowel sounds are normal. There is no distension.     Palpations: Abdomen is soft.     Tenderness: There is no abdominal tenderness. There is no right CVA tenderness or left CVA tenderness.  Skin:    General: Skin is warm and dry.  Neurological:     Mental Status: She is alert.  Psychiatric:        Mood and Affect: Mood normal.        Behavior: Behavior normal.        Thought Content: Thought content normal.        Judgment: Judgment normal.       Assessment & Plan:  1. Impacted cerumen of right ear -Procedure: Using wax curette cerumen impaction was easily removed.  There was no complication and following disimpaction of the tympanic membrane was visible on the right ear.  Tympanic membrane was intact following the procedure.  Auditory canal was normal.  The patient reported relief of symptoms after removal of cerumen  2. Urinary frequency - Will check UA and treat accordingly  [name], NP

## 2019-04-12 NOTE — Telephone Encounter (Signed)
Pt seen in [place] on 04/12/2019.  Nothing further needed.

## 2019-04-13 ENCOUNTER — Telehealth: Payer: Self-pay | Admitting: Adult Health

## 2019-04-13 MED ORDER — FLUTICASONE PROPIONATE 50 MCG/ACT NA SUSP: NASAL | 0 refills | Status: DC

## 2019-04-13 NOTE — Telephone Encounter (Signed)
Sent to the pharmacy by e-scribe. 

## 2019-04-13 NOTE — Telephone Encounter (Signed)
Copied from [place] [id number]. Topic: Quick Communication - See Telephone Encounter >> Apr 13, 2019  3:13 PM Blase Mess A wrote: CRM for notification. See Telephone encounter for: 04/13/19.  Patient had a visit yesterday. She states that nasal drops where supposed to be called in for her. They where not at the pharmacy. Please advise. [phone] (H) [facility] [phone] - [address], [address]  [place] [address]  Phone: [phone] Fax: [phone]  Not a 24 hour pharmacy; exact hours not known.

## 2019-04-14 LAB — URINE CULTURE
MICRO NUMBER:: 491786
SPECIMEN QUALITY:: ADEQUATE

## 2019-04-26 ENCOUNTER — Telehealth: Payer: Self-pay | Admitting: *Deleted

## 2019-04-26 DIAGNOSIS — R399 Unspecified symptoms and signs involving the genitourinary system: Secondary | ICD-10-CM | POA: Diagnosis not present

## 2019-04-26 NOTE — Telephone Encounter (Signed)
Spoke to the pt.  Cathy [name] would like her to follow up with urology.  She is having frequent UTI sx.  Pt has also recently had surgery on her bladder.  Pt agreed to call urology.  Nothing further needed.

## 2019-04-26 NOTE — Telephone Encounter (Signed)
Copied from [place]. Topic: General - Other >> Apr 26, 2019 10:08 AM Leward Quan A wrote: Reason for CRM: Patient called to say that she is experiencing pain during urination and the urine is very cloudy. Asking for a call back from Dr [name] or his nurse. Ph# [phone]

## 2019-05-01 ENCOUNTER — Ambulatory Visit
Admission: RE | Admit: 2019-05-01 | Discharge: 2019-05-01 | Disposition: A | Discharge status: Home / Self Care | Payer: Medicare HMO | Source: Ambulatory Visit | Attending: Adult Health | Admitting: Adult Health

## 2019-05-01 ENCOUNTER — Other Ambulatory Visit: Payer: Self-pay

## 2019-05-01 DIAGNOSIS — Z1231 Encounter for screening mammogram for malignant neoplasm of breast: Secondary | ICD-10-CM

## 2019-05-08 ENCOUNTER — Other Ambulatory Visit: Payer: Self-pay | Admitting: Adult Health

## 2019-05-09 NOTE — Telephone Encounter (Signed)
FILLED FOR 9 MONTHS ON 09/21/2019.  REQUEST IS EARLY.

## 2019-05-16 NOTE — Progress Notes (Signed)
[name] [place], [place] [address] Phone: [phone] Subjective:   I Cathy [name] am serving as a Education administrator for Dr. Hulan Saas.    CC: left hip pain f/u   [id number]   04/05/2019 Patient did have a groin injury but seems to be improving.  Does have underlying arthritis that I think is also contributing though.  We discussed with patient in great length about this.  Patient wants to continue with conservative therapy.  Patient does not want to do anything about her hip at this moment.  Patient did not want an injection or any type of replacement.  We did discuss patient's knee arthritis as well.  Patient has been doing somewhat good but did not make significant provement with the viscosupplementation.  Patient also did not want to do any other type of injections today.  Discussed icing regimen and home exercise.  Follow-up again 6 weeks spent  25 minutes with patient face-to-face and had greater than 50% of counseling including as described above in assessment and plan.  05/17/2019 Cathy [name] is a 73 y.o. female coming in with complaint of left hip pain. States the hip is doing well. States that her pain is off and on. Does not like Voltaren gel.  Patient found to have more of a groin strain with underlying arthritis.  Patient has been doing relatively well overall.  Patient states making improvement.      Past Medical History:  Diagnosis Date  . Chronic constipation   . Depression   . Diabetes mellitus type II   . DJD (degenerative joint disease)    knees  . Female cystocele   . GERD (gastroesophageal reflux disease)   . History of esophagitis   . History of MRSA infection    recurrent carbuncle  . History of recurrent UTIs   . Hyperlipidemia   . Hypertension   . Urgency of urination    Past Surgical History:  Procedure Laterality Date  . APPENDECTOMY  1980's  . BREAST EXCISIONAL BIOPSY Left 1989  . COLONOSCOPY  last  one 06-18-2015  . INGUINAL HERNIA REPAIR Left 05-10-2001  . LAPAROSCOPIC LYSIS OF ADHESIONS  01/17/2019  . OOPHORECTOMY Right 01/17/2019  . PERINEOPLASTY  01/17/2019  . SALPINGECTOMY Left 01/17/2019  . VAGINAL HYSTERECTOMY  1980's  . VAGINAL PROLAPSE REPAIR N/A 03/02/2016   Procedure: COLOPLAST ANTERIOR  VAULT REPAIR WITH AXIS DERMIS. SACROSPINUS FIXATION, AUGMENTATION WITH AXIS DERMIS;  Surgeon: Carolan Clines, MD;  Location: [facility];  Service: Urology;  Laterality: N/A;   Social History   Socioeconomic History  . Marital status: Single    Spouse name: Not on file  . Number of children: Not on file  . Years of education: Not on file  . Highest education level: Not on file  Occupational History  . Not on file  Social Needs  . Financial resource strain: Not on file  . Food insecurity    Worry: Not on file    Inability: Not on file  . Transportation needs    Medical: Not on file    Non-medical: Not on file  Tobacco Use  . Smoking status: Former Smoker    Years: 1.00    Types: Cigarettes    Quit date: 02/28/1996    Years since quitting: 23.2  . Smokeless tobacco: Never Used  Substance and Sexual Activity  . Alcohol use: Yes    Alcohol/week: 0.0 standard drinks    Comment:  occasional  . Drug use: No  . Sexual activity: Not on file  Lifestyle  . Physical activity    Days per week: Not on file    Minutes per session: Not on file  . Stress: Not on file  Relationships  . Social Herbalist on phone: Not on file    Gets together: Not on file    Attends religious service: Not on file    Active member of club or organization: Not on file    Attends meetings of clubs or organizations: Not on file    Relationship status: Not on file  Other Topics Concern  . Not on file  Social History Narrative   Single   Former Smoker  -  quit 10 to 11 years ago (light smoker)   Alcohol use-yes     2 children    Occupation: [employer]     Allergies  Allergen Reactions  . Penicillins Hives    Has patient had a PCN reaction causing immediate rash, facial/tongue/throat swelling, SOB or lightheadedness with hypotension: Yes Has patient had a PCN reaction causing severe rash involving mucus membranes or skin necrosis: No Has patient had a PCN reaction that required hospitalization: No Has patient had a PCN reaction occurring within the last 10 years: No If all of the above answers are "NO", then may proceed with Cephalosporin use.    Family History  Problem Relation Age of Onset  . Aneurysm Mother 25       deceased secondary to brain aneurysm  . Liver cancer Father 74       deceased  . Diabetes Other        grandmother  . Colon cancer Neg Hx   . Stomach cancer Neg Hx   . Rectal cancer Neg Hx   . Esophageal cancer Neg Hx   . Breast cancer Neg Hx      Current Outpatient Medications (Cardiovascular):  .  lisinopril (PRINIVIL,ZESTRIL) 10 MG tablet, TAKE 1 TABLET(10 MG) BY MOUTH DAILY .  simvastatin (ZOCOR) 40 MG tablet, TAKE 1 TABLET BY MOUTH ONCE DAILY AT 6PM  Current Outpatient Medications (Respiratory):  .  fluticasone (FLONASE) 50 MCG/ACT nasal spray, SHAKE LIQUID AND USE 2 SPRAYS IN EACH NOSTRIL DAILY .  loratadine (CLARITIN) 10 MG tablet, Take 10 mg by mouth daily.  Current Outpatient Medications (Analgesics):  .  aspirin 81 MG tablet, Take 81 mg by mouth daily.   .  meloxicam (MOBIC) 7.5 MG tablet, Take 1 tablet (7.5 mg total) by mouth daily.   Current Outpatient Medications (Other):  [name]  Calcium Carbonate-Vitamin D (CALTRATE 600+D) 600-400 MG-UNIT per tablet, Take 1 tablet by mouth daily.   [name]  CRANBERRY PO, Take 2 tablets by mouth daily.  .  cyclobenzaprine (FLEXERIL) 5 MG tablet, TAKE 1 TABLET BY MOUTH THREE TIMES DAILY AS NEEDED FOR MUSCLE SPASM .  estradiol (ESTRACE) 0.1 MG/GM vaginal cream, U 0.5 GRAM VAGINALLY HS .  gabapentin (NEURONTIN) 100 MG capsule, Take 2 capsules (200 mg total) by mouth at  bedtime. [name]  glucose blood (ONETOUCH VERIO) test strip, USE TO TEST BLOOD GLUCOSE TWICE DAILY. [name]  Misc Natural Products (TART CHERRY ADVANCED) CAPS, Take 1 capsule by mouth daily. Glory Rosebush DELICA LANCETS FINE MISC, USE TO CHECK BLOOD SUGAR ONCE DAILY .  polyethylene glycol (MIRALAX / GLYCOLAX) packet, Take 17 g by mouth daily as needed for mild constipation. .  Tetrahydrozoline HCl (VISINE OP), Apply 1  drop to eye daily as needed (RED EYE). [name]  tolterodine (DETROL LA) 2 MG 24 hr capsule, Take 1 capsule (2 mg total) by mouth daily. .  TURMERIC PO, Take 1 tablet by mouth daily.     Past medical history, social, surgical and family history all reviewed in electronic medical record.  No pertanent information unless stated regarding to the chief complaint.   Review of Systems:  No headache, visual changes, nausea, vomiting, diarrhea, constipation, dizziness, abdominal pain, skin rash, fevers, chills, night sweats, weight loss, swollen lymph nodes, body aches, joint swelling,  chest pain, shortness of breath, mood changes.  Positive muscle aches  Objective  Blood pressure 122/70, pulse (!) 103, height 5\' 4"  (1.626 m), weight 154 lb (69.9 kg), SpO2 98 %.    General: No apparent distress alert and oriented x3 mood and affect normal, dressed appropriately.  HEENT: Pupils equal, extraocular movements intact  Respiratory: Patient's speak in full sentences and does not appear short of breath  Cardiovascular: No lower extremity edema, non tender, no erythema  Skin: Warm dry intact with no signs of infection or rash on extremities or on axial skeleton.  Abdomen: Soft nontender  Neuro: Cranial nerves II through XII are intact, neurovascularly intact in all extremities with 2+ DTRs and 2+ pulses.  Lymph: No lymphadenopathy of posterior or anterior cervical chain or axillae bilaterally.  Gait severely antalgic MSK:  Non tender with full range of motion and good stability and symmetric strength and tone  of shoulders, elbows, wrist, and ankles bilaterally. Hip: Left ROM IR: 15 Deg, ER: 35 Deg, Flexion: 120 Deg, Extension: 100 Deg, Abduction: 45 Deg, Adduction: 15 Deg Strength IR: 5/5, ER: 5/5, Flexion: 4/5, Extension: 5/5, Abduction: 5/5, Adduction: 4/5 but symmetric Pelvic alignment unremarkable to inspection and palpation. Standing hip rotation and gait without trendelenburg sign / unsteadiness. Greater trochanter without tenderness to palpation. Patient does have tenderness with flexion of the hip but no worsening pain with internal motion. No SI joint tenderness and normal minimal SI movement. Contralateral hip mild decrease in range of motion but nontender  Knee: Bilateral valgus deformity noted.  Abnormal thigh to calf ratio.  Tender to palpation over medial and PF joint line.  ROM full in flexion and extension and lower leg rotation. instability with valgus force.  painful patellar compression. Patellar glide with moderate crepitus. Patellar and quadriceps tendons unremarkable. Hamstring and quadriceps strength is normal.  After informed written and verbal consent, patient was seated on exam table. Right knee was prepped with alcohol swab and utilizing anterolateral approach, patient's right knee space was injected with 4:1  marcaine 0.5%: Kenalog 40mg /dL. Patient tolerated the procedure well without immediate complications.  After informed written and verbal consent, patient was seated on exam table. Left knee was prepped with alcohol swab and utilizing anterolateral approach, patient's left knee space was injected with 4:1  marcaine 0.5%: Kenalog 40mg /dL. Patient tolerated the procedure well without immediate complications.   Impression and Recommendations:     This case required medical decision making of moderate complexity. The above documentation has been reviewed and is accurate and complete [name], DO       Note: This dictation was prepared with Dragon  dictation along with smaller phrase technology. Any transcriptional errors that result from this process are unintentional.

## 2019-05-17 ENCOUNTER — Encounter: Payer: Self-pay | Admitting: Family Medicine

## 2019-05-17 ENCOUNTER — Other Ambulatory Visit: Payer: Self-pay

## 2019-05-17 ENCOUNTER — Ambulatory Visit (INDEPENDENT_AMBULATORY_CARE_PROVIDER_SITE_OTHER): Payer: Medicare HMO | Admitting: Family Medicine

## 2019-05-17 DIAGNOSIS — R1032 Left lower quadrant pain: Secondary | ICD-10-CM | POA: Diagnosis not present

## 2019-05-17 DIAGNOSIS — M170 Bilateral primary osteoarthritis of knee: Secondary | ICD-10-CM

## 2019-05-17 NOTE — Assessment & Plan Note (Signed)
Improvement noted.  Does have underlying hip arthritis.  We will continue to monitor.  Follow-up again in 4 to 8 weeks

## 2019-05-17 NOTE — Assessment & Plan Note (Signed)
Bilateral injections given today.  Has done fairly well with the viscosupplementation previously.  Patient any steroid injections advanced at the modified the viscosupplementation.  Patient can have the injections again in September.  We discussed icing regimen and home exercise.  Continue the oral anti-inflammatories as needed.  Follow-up again in 2 months

## 2019-05-17 NOTE — Patient Instructions (Signed)
Injected the knees again Keep icing 20 minutes, 2x daily Thigh compression sleeve daily Continue exercises See me again in 2-3 months

## 2019-05-26 ENCOUNTER — Other Ambulatory Visit: Payer: Self-pay | Admitting: Adult Health

## 2019-05-26 DIAGNOSIS — G5603 Carpal tunnel syndrome, bilateral upper limbs: Secondary | ICD-10-CM

## 2019-05-26 DIAGNOSIS — T148XXA Other injury of unspecified body region, initial encounter: Secondary | ICD-10-CM

## 2019-06-20 ENCOUNTER — Telehealth: Payer: Self-pay | Admitting: Adult Health

## 2019-06-20 ENCOUNTER — Encounter: Payer: Self-pay | Admitting: Family Medicine

## 2019-06-20 ENCOUNTER — Other Ambulatory Visit: Payer: Self-pay | Admitting: Adult Health

## 2019-06-20 NOTE — Telephone Encounter (Signed)
Medication Refill - Medication: lisinopril (ZESTRIL) 10 MG tablet [Pharmacy Med Name: LISINOPRIL 10MG  TABLETS] (Pharmacy stated that they have tried reaching out multiple times to get medication sent and signed off on.)   Has the patient contacted their pharmacy?Yes (Agent: If no, request that the patient contact the pharmacy for the refill.) (Agent: If yes, when and what did the pharmacy advise?)Contact PCP  Preferred Pharmacy (with phone number or street name):  [facility] [phone] - [place], [address] AT [address] [phone] (Phone) [phone] (Fax)     Agent: Please be advised that RX refills may take up to 3 business days. We ask that you follow-up with your pharmacy.

## 2019-06-20 NOTE — Telephone Encounter (Signed)
Medication sent to the pharmacy by e-scribe.  See refill encounter.

## 2019-07-01 ENCOUNTER — Other Ambulatory Visit: Payer: Self-pay | Admitting: Adult Health

## 2019-07-01 DIAGNOSIS — M25552 Pain in left hip: Secondary | ICD-10-CM

## 2019-07-05 NOTE — Telephone Encounter (Signed)
Sent to the pharmacy by e-scribe.  Pt has upcoming cpx. 

## 2019-07-16 NOTE — Progress Notes (Signed)
[name] [place], [name] [address] Phone: [phone] Subjective:   I Cathy [name] am serving as a Education administrator for Dr. Hulan Saas.    CC: Bilateral knee pain  [id number]   05/17/2019 Bilateral injections given today.  Has done fairly well with the viscosupplementation previously.  Patient any steroid injections advanced at the modified the viscosupplementation.  Patient can have the injections again in September.  We discussed icing regimen and home exercise.  Continue the oral anti-inflammatories as needed.  Follow-up again in 2 months  07/17/2019 Cathy [name] is a 73 y.o. female coming in with complaint of bilateral knee pain. States the knees are painful today. Bilateral injections.  Patient has brought in her viscosupplementation.     Past Medical History:  Diagnosis Date  . Chronic constipation   . Depression   . Diabetes mellitus type II   . DJD (degenerative joint disease)    knees  . Female cystocele   . GERD (gastroesophageal reflux disease)   . History of esophagitis   . History of MRSA infection    recurrent carbuncle  . History of recurrent UTIs   . Hyperlipidemia   . Hypertension   . Urgency of urination    Past Surgical History:  Procedure Laterality Date  . APPENDECTOMY  1980's  . BREAST EXCISIONAL BIOPSY Left 1989  . COLONOSCOPY  last one 06-18-2015  . INGUINAL HERNIA REPAIR Left 05-10-2001  . LAPAROSCOPIC LYSIS OF ADHESIONS  01/17/2019  . OOPHORECTOMY Right 01/17/2019  . PERINEOPLASTY  01/17/2019  . SALPINGECTOMY Left 01/17/2019  . VAGINAL HYSTERECTOMY  1980's  . VAGINAL PROLAPSE REPAIR N/A 03/02/2016   Procedure: COLOPLAST ANTERIOR  VAULT REPAIR WITH AXIS DERMIS. SACROSPINUS FIXATION, AUGMENTATION WITH AXIS DERMIS;  Surgeon: Carolan Clines, MD;  Location: [facility];  Service: Urology;  Laterality: N/A;   Social History   Socioeconomic History  . Marital status: Single   Spouse name: Not on file  . Number of children: Not on file  . Years of education: Not on file  . Highest education level: Not on file  Occupational History  . Not on file  Social Needs  . Financial resource strain: Not on file  . Food insecurity    Worry: Not on file    Inability: Not on file  . Transportation needs    Medical: Not on file    Non-medical: Not on file  Tobacco Use  . Smoking status: Former Smoker    Years: 1.00    Types: Cigarettes    Quit date: 02/28/1996    Years since quitting: 23.3  . Smokeless tobacco: Never Used  Substance and Sexual Activity  . Alcohol use: Yes    Alcohol/week: 0.0 standard drinks    Comment: occasional  . Drug use: No  . Sexual activity: Not on file  Lifestyle  . Physical activity    Days per week: Not on file    Minutes per session: Not on file  . Stress: Not on file  Relationships  . Social Herbalist on phone: Not on file    Gets together: Not on file    Attends religious service: Not on file    Active member of club or organization: Not on file    Attends meetings of clubs or organizations: Not on file    Relationship status: Not on file  Other Topics Concern  . Not on file  Social History Narrative  Single   Former Smoker  -  quit 10 to 11 years ago (light smoker)   Alcohol use-yes     2 children    Occupation: [employer]    Allergies  Allergen Reactions  . Penicillins Hives    Has patient had a PCN reaction causing immediate rash, facial/tongue/throat swelling, SOB or lightheadedness with hypotension: Yes Has patient had a PCN reaction causing severe rash involving mucus membranes or skin necrosis: No Has patient had a PCN reaction that required hospitalization: No Has patient had a PCN reaction occurring within the last 10 years: No If all of the above answers are "NO", then may proceed with Cephalosporin use.    Family History  Problem Relation Age of Onset  . Aneurysm Mother 74        deceased secondary to brain aneurysm  . Liver cancer Father 67       deceased  . Diabetes Other        grandmother  . Colon cancer Neg Hx   . Stomach cancer Neg Hx   . Rectal cancer Neg Hx   . Esophageal cancer Neg Hx   . Breast cancer Neg Hx      Current Outpatient Medications (Cardiovascular):  .  lisinopril (ZESTRIL) 10 MG tablet, TAKE 1 TABLET(10 MG) BY MOUTH DAILY .  simvastatin (ZOCOR) 40 MG tablet, TAKE 1 TABLET BY MOUTH ONCE DAILY AT 6PM  Current Outpatient Medications (Respiratory):  .  fluticasone (FLONASE) 50 MCG/ACT nasal spray, SHAKE LIQUID AND USE 2 SPRAYS IN EACH NOSTRIL DAILY .  loratadine (CLARITIN) 10 MG tablet, Take 10 mg by mouth daily.  Current Outpatient Medications (Analgesics):  .  aspirin 81 MG tablet, Take 81 mg by mouth daily.   .  meloxicam (MOBIC) 7.5 MG tablet, TAKE 1 TABLET(7.5 MG) BY MOUTH DAILY .  naproxen (NAPROSYN) 500 MG tablet, TAKE 1 TABLET(500 MG) BY MOUTH TWICE DAILY AS NEEDED FOR PAIN   Current Outpatient Medications (Other):  [name]  Calcium Carbonate-Vitamin D (CALTRATE 600+D) 600-400 MG-UNIT per tablet, Take 1 tablet by mouth daily.   [name]  CRANBERRY PO, Take 2 tablets by mouth daily.  .  cyclobenzaprine (FLEXERIL) 5 MG tablet, TAKE 1 TABLET BY MOUTH THREE TIMES DAILY AS NEEDED FOR MUSCLE SPASM .  estradiol (ESTRACE) 0.1 MG/GM vaginal cream, U 0.5 GRAM VAGINALLY HS .  glucose blood (ONETOUCH VERIO) test strip, USE TO TEST BLOOD GLUCOSE TWICE DAILY. [name]  Misc Natural Products (TART CHERRY ADVANCED) CAPS, Take 1 capsule by mouth daily. Glory Rosebush DELICA LANCETS FINE MISC, USE TO CHECK BLOOD SUGAR ONCE DAILY .  polyethylene glycol (MIRALAX / GLYCOLAX) packet, Take 17 g by mouth daily as needed for mild constipation. .  Tetrahydrozoline HCl (VISINE OP), Apply 1 drop to eye daily as needed (RED EYE). [name]  tolterodine (DETROL LA) 2 MG 24 hr capsule, Take 1 capsule (2 mg total) by mouth daily. .  TURMERIC PO, Take 1 tablet by mouth daily.  .   nortriptyline (PAMELOR) 10 MG capsule, Take 1 capsule (10 mg total) by mouth at bedtime.    Past medical history, social, surgical and family history all reviewed in electronic medical record.  No pertanent information unless stated regarding to the chief complaint.   Review of Systems:  No headache, visual changes, nausea, vomiting, diarrhea, constipation, dizziness, abdominal pain, skin rash, fevers, chills, night sweats, weight loss, swollen lymph nodes, body aches, joint swelling, muscle aches, chest pain, shortness of breath,  mood changes.   Objective  Blood pressure (!) 148/90, pulse 81, height 5\' 4"  (1.626 m), weight 155 lb (70.3 kg), SpO2 98 %.    General: No apparent distress alert and oriented x3 mood and affect normal, dressed appropriately.  HEENT: Pupils equal, extraocular movements intact  Respiratory: Patient's speak in full sentences and does not appear short of breath  Cardiovascular: No lower extremity edema, non tender, no erythema  Skin: Warm dry intact with no signs of infection or rash on extremities or on axial skeleton.  Abdomen: Soft nontender  Neuro: Cranial nerves II through XII are intact, neurovascularly intact in all extremities with 2+ DTRs and 2+ pulses.  Lymph: No lymphadenopathy of posterior or anterior cervical chain or axillae bilaterally.  Gait severely antalgic MSK:  Non tender with full range of motion and good stability and symmetric strength and tone of shoulders, elbows, wrist, and ankles bilaterally.  Arthritic changes of multiple joints Knee: Bilateral valgus deformity noted.  Abnormal thigh to calf ratio.  Tender to palpation over medial and PF joint line.  ROM full in flexion and extension and lower leg rotation. instability with valgus force.  painful patellar compression. Patellar glide with moderate crepitus. Patellar and quadriceps tendons unremarkable. Hamstring and quadriceps strength is normal.   After informed written and verbal  consent, patient was seated on exam table. Right knee was prepped with alcohol swab and utilizing anterolateral approach, patient's right knee space was injected with 22mg /mLL of monovisc (sodium hyaluronate) in a prefilled syringe was injected easily into the knee through a 22-gauge needle..Patient tolerated the procedure well without immediate complications.  After informed written and verbal consent, patient was seated on exam table. Left knee was prepped with alcohol swab and utilizing anterolateral approach, patient's left knee space was injected with 22mg /mL Monovisc (sodium hyaluronate) in a prefilled syringe was injected easily into the knee through a 22-gauge needle..Patient tolerated the procedure well without immediate complications.    Impression and Recommendations:     This case required medical decision making of moderate complexity. The above documentation has been reviewed and is accurate and complete [name], DO       Note: This dictation was prepared with Dragon dictation along with smaller phrase technology. Any transcriptional errors that result from this process are unintentional.

## 2019-07-17 ENCOUNTER — Ambulatory Visit (INDEPENDENT_AMBULATORY_CARE_PROVIDER_SITE_OTHER): Payer: Medicare Other | Admitting: Family Medicine

## 2019-07-17 ENCOUNTER — Encounter: Payer: Self-pay | Admitting: Family Medicine

## 2019-07-17 DIAGNOSIS — M170 Bilateral primary osteoarthritis of knee: Secondary | ICD-10-CM

## 2019-07-17 MED ORDER — NORTRIPTYLINE HCL 10 MG PO CAPS: 10.0000 mg | ORAL_CAPSULE | Freq: Every day | ORAL | 1 refills | Status: DC

## 2019-07-17 NOTE — Patient Instructions (Signed)
Good to see you  Ice is your friend You know the drill  See me again in 8 weeks

## 2019-07-17 NOTE — Assessment & Plan Note (Signed)
Viscosupplementation given again today.  Medications today.  Discussed with patient which activities to do which was to avoid.  Discussed posture and ergonomics.  Discussed how the knees will feel full for quite some time.  Follow-up again 8 weeks

## 2019-07-28 ENCOUNTER — Encounter: Payer: Self-pay | Admitting: Adult Health

## 2019-07-28 ENCOUNTER — Ambulatory Visit (INDEPENDENT_AMBULATORY_CARE_PROVIDER_SITE_OTHER): Payer: Medicare Other | Admitting: Adult Health

## 2019-07-28 VITALS — BP 126/70 | Temp 97.8°F | Ht 62.25 in | Wt 150.0 lb

## 2019-07-28 DIAGNOSIS — Z23 Encounter for immunization: Secondary | ICD-10-CM

## 2019-07-28 DIAGNOSIS — E2839 Other primary ovarian failure: Secondary | ICD-10-CM | POA: Diagnosis not present

## 2019-07-28 DIAGNOSIS — M170 Bilateral primary osteoarthritis of knee: Secondary | ICD-10-CM | POA: Diagnosis not present

## 2019-07-28 DIAGNOSIS — E785 Hyperlipidemia, unspecified: Secondary | ICD-10-CM | POA: Diagnosis not present

## 2019-07-28 DIAGNOSIS — E118 Type 2 diabetes mellitus with unspecified complications: Secondary | ICD-10-CM

## 2019-07-28 DIAGNOSIS — I10 Essential (primary) hypertension: Secondary | ICD-10-CM

## 2019-07-28 DIAGNOSIS — Z0000 Encounter for general adult medical examination without abnormal findings: Secondary | ICD-10-CM

## 2019-07-28 LAB — TSH: TSH: 1.19 u[IU]/mL (ref 0.35–4.50)

## 2019-07-28 LAB — CBC WITH DIFFERENTIAL/PLATELET
Basophils Absolute: 0.1 10*3/uL (ref 0.0–0.1)
Basophils Relative: 0.7 % (ref 0.0–3.0)
Eosinophils Absolute: 0.1 10*3/uL (ref 0.0–0.7)
Eosinophils Relative: 1.1 % (ref 0.0–5.0)
HCT: 40.2 % (ref 36.0–46.0)
Hemoglobin: 13 g/dL (ref 12.0–15.0)
Lymphocytes Relative: 21.7 % (ref 12.0–46.0)
Lymphs Abs: 2 10*3/uL (ref 0.7–4.0)
MCHC: 32.2 g/dL (ref 30.0–36.0)
MCV: 79.1 fl (ref 78.0–100.0)
Monocytes Absolute: 0.6 10*3/uL (ref 0.1–1.0)
Monocytes Relative: 6.6 % (ref 3.0–12.0)
Neutro Abs: 6.3 10*3/uL (ref 1.4–7.7)
Neutrophils Relative %: 69.9 % (ref 43.0–77.0)
Platelets: 352 10*3/uL (ref 150.0–400.0)
RBC: 5.09 Mil/uL (ref 3.87–5.11)
RDW: 16 % — ABNORMAL HIGH (ref 11.5–15.5)
WBC: 9 10*3/uL (ref 4.0–10.5)

## 2019-07-28 LAB — COMPREHENSIVE METABOLIC PANEL
ALT: 14 U/L (ref 0–35)
AST: 18 U/L (ref 0–37)
Albumin: 4.5 g/dL (ref 3.5–5.2)
Alkaline Phosphatase: 67 U/L (ref 39–117)
BUN: 16 mg/dL (ref 6–23)
CO2: 27 mEq/L (ref 19–32)
Calcium: 9.8 mg/dL (ref 8.4–10.5)
Chloride: 100 mEq/L (ref 96–112)
Creatinine, Ser: 0.93 mg/dL (ref 0.40–1.20)
GFR: 71.96 mL/min (ref >60.00)
Glucose, Bld: 100 mg/dL — ABNORMAL HIGH (ref 70–99)
Potassium: 3.9 mEq/L (ref 3.5–5.1)
Sodium: 137 mEq/L (ref 135–145)
Total Bilirubin: 0.6 mg/dL (ref 0.2–1.2)
Total Protein: 7.2 g/dL (ref 6.0–8.3)

## 2019-07-28 LAB — HEMOGLOBIN A1C: Hgb A1c MFr Bld: 6.3 % (ref 4.6–6.5)

## 2019-07-28 LAB — LIPID PANEL
Cholesterol: 205 mg/dL — ABNORMAL HIGH (ref 0–200)
HDL: 68.5 mg/dL (ref >39.00)
LDL Cholesterol: 122 mg/dL — ABNORMAL HIGH (ref 0–99)
NonHDL: 136.6
Total CHOL/HDL Ratio: 3
Triglycerides: 72 mg/dL (ref 0.0–149.0)
VLDL: 14.4 mg/dL (ref 0.0–40.0)

## 2019-07-28 LAB — VITAMIN D 25 HYDROXY (VIT D DEFICIENCY, FRACTURES): VITD: 37.44 ng/mL (ref 30.00–100.00)

## 2019-07-28 MED ORDER — SIMVASTATIN 40 MG PO TABS: ORAL_TABLET | ORAL | 3 refills | Status: DC

## 2019-07-28 MED ORDER — CYCLOBENZAPRINE HCL 5 MG PO TABS: ORAL_TABLET | ORAL | 1 refills | Status: DC

## 2019-07-28 NOTE — Patient Instructions (Signed)
It was great seeing you today. We will follow up with you regarding your blood work    Schedule your bone density test at check out desk. You may also call directly to X-ray at [phone] to schedule an appointment that is convenient for you.  - located [address] across the street from [place] - in the basement - you do need an appointment for the bone density tests.   Continue to work on diet and exercise.   Let me know if you need anything

## 2019-07-28 NOTE — Progress Notes (Signed)
Subjective:    Patient ID: Cathy [name], female    DOB: [birth year], 73 y.o.   MRN: [id number]  HPI Patient presents for yearly preventative medicine examination. She is a pleasant 73 year old female who  has a past medical history of Chronic constipation, Depression, Diabetes mellitus type II, DJD (degenerative joint disease), Female cystocele, GERD (gastroesophageal reflux disease), History of esophagitis, History of MRSA infection, History of recurrent UTIs, Hyperlipidemia, Hypertension, and Urgency of urination.  Diabetes Mellitus Type 2-is diet controlled.  She does not monitor her blood sugars on a routine basis but when she does check she reports readings below 120.  She denies hypoglycemic episodes Lab Results  Component Value Date   HGBA1C 6.1 (A) 12/16/2018   Essential Hypertension -takes lisinopril 10 mg daily.  She denies lightheadedness, dizziness, chest pain, or shortness of breath BP Readings from Last 3 Encounters:  07/28/19 126/70  07/17/19 (!) 148/90  05/17/19 122/70   Hyperlipidemia -takes simvastatin 40 mg daily.  She denies myalgia or fatigue Lab Results  Component Value Date   CHOL 191 07/13/2018   HDL 74.00 07/13/2018   LDLCALC 100 (H) 07/13/2018   LDLDIRECT 118.2 11/21/2013   TRIG 85.0 07/13/2018   CHOLHDL 3 07/13/2018   Osteoarthritis -takes naproxen as needed.She has had gel injections into her knees and reports that this has helped a lot.    All immunizations and health maintenance protocols were reviewed with the patient and needed orders were placed.  She is due for flu vaccination  Appropriate screening laboratory values were ordered for the patient including screening of hyperlipidemia, renal function and hepatic function.   Medication reconciliation,  past medical history, social history, problem list and allergies were reviewed in detail with the patient  Goals were established with regard to weight loss, exercise, and  diet in compliance  with medications. She is walking about a mile 5 days a week and is trying to eat healthy.   Wt Readings from Last 3 Encounters:  07/28/19 150 lb (68 kg)  07/17/19 155 lb (70.3 kg)  05/17/19 154 lb (69.9 kg)   End of life planning was discussed.  She is up-to-date on routine dental and vision screens as well as screening mammogram and colonoscopy   Review of Systems  Constitutional: Negative.   HENT: Negative.   Eyes: Negative.   Respiratory: Negative.   Cardiovascular: Negative.   Gastrointestinal: Negative.   Endocrine: Negative.   Genitourinary: Negative.   Musculoskeletal: Negative.   Skin: Negative.   Allergic/Immunologic: Negative.   Neurological: Negative.   Hematological: Negative.   Psychiatric/Behavioral: Negative.    Past Medical History:  Diagnosis Date  . Chronic constipation   . Depression   . Diabetes mellitus type II   . DJD (degenerative joint disease)    knees  . Female cystocele   . GERD (gastroesophageal reflux disease)   . History of esophagitis   . History of MRSA infection    recurrent carbuncle  . History of recurrent UTIs   . Hyperlipidemia   . Hypertension   . Urgency of urination     Social History   Socioeconomic History  . Marital status: Single    Spouse name: Not on file  . Number of children: Not on file  . Years of education: Not on file  . Highest education level: Not on file  Occupational History  . Not on file  Social Needs  . Financial resource strain: Not on file  . Food  insecurity    Worry: Not on file    Inability: Not on file  . Transportation needs    Medical: Not on file    Non-medical: Not on file  Tobacco Use  . Smoking status: Former Smoker    Years: 1.00    Types: Cigarettes    Quit date: 02/28/1996    Years since quitting: 23.4  . Smokeless tobacco: Never Used  Substance and Sexual Activity  . Alcohol use: Yes    Alcohol/week: 0.0 standard drinks    Comment: occasional  . Drug use: No  . Sexual  activity: Not on file  Lifestyle  . Physical activity    Days per week: Not on file    Minutes per session: Not on file  . Stress: Not on file  Relationships  . Social Herbalist on phone: Not on file    Gets together: Not on file    Attends religious service: Not on file    Active member of club or organization: Not on file    Attends meetings of clubs or organizations: Not on file    Relationship status: Not on file  . Intimate partner violence    Fear of current or ex partner: Not on file    Emotionally abused: Not on file    Physically abused: Not on file    Forced sexual activity: Not on file  Other Topics Concern  . Not on file  Social History Narrative   Single   Former Smoker  -  quit 10 to 11 years ago (light smoker)   Alcohol use-yes     2 children    Occupation: [employer]     Past Surgical History:  Procedure Laterality Date  . APPENDECTOMY  1980's  . BREAST EXCISIONAL BIOPSY Left 1989  . COLONOSCOPY  last one 06-18-2015  . INGUINAL HERNIA REPAIR Left 05-10-2001  . LAPAROSCOPIC LYSIS OF ADHESIONS  01/17/2019  . OOPHORECTOMY Right 01/17/2019  . PERINEOPLASTY  01/17/2019  . SALPINGECTOMY Left 01/17/2019  . VAGINAL HYSTERECTOMY  1980's  . VAGINAL PROLAPSE REPAIR N/A 03/02/2016   Procedure: COLOPLAST ANTERIOR  VAULT REPAIR WITH AXIS DERMIS. SACROSPINUS FIXATION, AUGMENTATION WITH AXIS DERMIS;  Surgeon: Carolan Clines, MD;  Location: [facility];  Service: Urology;  Laterality: N/A;    Family History  Problem Relation Age of Onset  . Aneurysm Mother 65       deceased secondary to brain aneurysm  . Liver cancer Father 67       deceased  . Diabetes Other        grandmother  . Colon cancer Neg Hx   . Stomach cancer Neg Hx   . Rectal cancer Neg Hx   . Esophageal cancer Neg Hx   . Breast cancer Neg Hx     Allergies  Allergen Reactions  . Penicillins Hives    Has patient had a PCN reaction causing immediate  rash, facial/tongue/throat swelling, SOB or lightheadedness with hypotension: Yes Has patient had a PCN reaction causing severe rash involving mucus membranes or skin necrosis: No Has patient had a PCN reaction that required hospitalization: No Has patient had a PCN reaction occurring within the last 10 years: No If all of the above answers are "NO", then may proceed with Cephalosporin use.     Current Outpatient Medications on File Prior to Visit  Medication Sig Dispense Refill  . aspirin 81 MG tablet Take 81 mg by mouth daily.      [name]  Calcium Carbonate-Vitamin D (CALTRATE 600+D) 600-400 MG-UNIT per tablet Take 1 tablet by mouth daily.      [name] CRANBERRY PO Take 2 tablets by mouth daily.     . cyclobenzaprine (FLEXERIL) 5 MG tablet TAKE 1 TABLET BY MOUTH THREE TIMES DAILY AS NEEDED FOR MUSCLE SPASM 30 tablet 1  . estradiol (ESTRACE) 0.1 MG/GM vaginal cream U 0.5 GRAM VAGINALLY HS  3  . fluticasone (FLONASE) 50 MCG/ACT nasal spray SHAKE LIQUID AND USE 2 SPRAYS IN EACH NOSTRIL DAILY 48 g 0  . glucose blood (ONETOUCH VERIO) test strip USE TO TEST BLOOD GLUCOSE TWICE DAILY. 200 each 3  . lisinopril (ZESTRIL) 10 MG tablet TAKE 1 TABLET(10 MG) BY MOUTH DAILY 90 tablet 0  . loratadine (CLARITIN) 10 MG tablet Take 10 mg by mouth daily.    . meloxicam (MOBIC) 7.5 MG tablet TAKE 1 TABLET(7.5 MG) BY MOUTH DAILY 90 tablet 0  . Misc Natural Products (TART CHERRY ADVANCED) CAPS Take 1 capsule by mouth daily.    . naproxen (NAPROSYN) 500 MG tablet TAKE 1 TABLET(500 MG) BY MOUTH TWICE DAILY AS NEEDED FOR PAIN 180 tablet 0  . nortriptyline (PAMELOR) 10 MG capsule Take 1 capsule (10 mg total) by mouth at bedtime. 30 capsule 1  . ONETOUCH DELICA LANCETS FINE MISC USE TO CHECK BLOOD SUGAR ONCE DAILY 100 each 3  . polyethylene glycol (MIRALAX / GLYCOLAX) packet Take 17 g by mouth daily as needed for mild constipation.    . simvastatin (ZOCOR) 40 MG tablet TAKE 1 TABLET BY MOUTH ONCE DAILY AT 6PM 90 tablet 3  .  Tetrahydrozoline HCl (VISINE OP) Apply 1 drop to eye daily as needed (RED EYE).    . TURMERIC PO Take 1 tablet by mouth daily.     . [DISCONTINUED] valsartan (DIOVAN) 160 MG tablet Take 1/2 tablet by mouth once daily 30 tablet 5   No current facility-administered medications on file prior to visit.     BP 126/70   Temp 97.8 F (36.6 C) (Temporal)   Ht 5' 2.25" (1.581 m) Comment: WITHOUT SHOES  Wt 150 lb (68 kg)   BMI 27.22 kg/m       Objective:   Physical Exam Vitals signs and nursing note reviewed.  Constitutional:      General: She is not in acute distress.    Appearance: Normal appearance. She is not diaphoretic.  HENT:     Head: Normocephalic and atraumatic.     Right Ear: Tympanic membrane, ear canal and external ear normal. There is no impacted cerumen.     Left Ear: Tympanic membrane, ear canal and external ear normal. There is no impacted cerumen.     Nose: Nose normal. No congestion or rhinorrhea.     Mouth/Throat:     Mouth: Mucous membranes are moist.     Pharynx: Oropharynx is clear. No oropharyngeal exudate or posterior oropharyngeal erythema.  Eyes:     General: No scleral icterus.       Right eye: No discharge.        Left eye: No discharge.     Conjunctiva/sclera: Conjunctivae normal.     Pupils: Pupils are equal, round, and reactive to light.  Neck:     Musculoskeletal: Normal range of motion and neck supple.     Thyroid: No thyromegaly.     Vascular: No JVD.     Trachea: No tracheal deviation.  Cardiovascular:     Rate and Rhythm: Normal rate and regular rhythm.  Heart sounds: Normal heart sounds. No murmur. No friction rub. No gallop.   Pulmonary:     Effort: Pulmonary effort is normal. No respiratory distress.     Breath sounds: Normal breath sounds. No stridor. No wheezing or rales.  Chest:     Chest wall: No tenderness.  Abdominal:     General: Abdomen is flat. Bowel sounds are normal. There is no distension.     Palpations: Abdomen is  soft. There is no mass.     Tenderness: There is no abdominal tenderness. There is no right CVA tenderness, left CVA tenderness, guarding or rebound.     Hernia: No hernia is present.  Musculoskeletal: Normal range of motion.        General: No swelling, tenderness, deformity or signs of injury.     Right lower leg: No edema.     Left lower leg: No edema.  Lymphadenopathy:     Cervical: No cervical adenopathy.  Skin:    General: Skin is warm and dry.     Capillary Refill: Capillary refill takes less than 2 seconds.     Coloration: Skin is not jaundiced or pale.     Findings: No bruising, erythema, lesion or rash.  Neurological:     General: No focal deficit present.     Mental Status: She is alert and oriented to person, place, and time. Mental status is at baseline.     Cranial Nerves: No cranial nerve deficit.     Sensory: No sensory deficit.     Motor: No weakness or abnormal muscle tone.     Coordination: Coordination normal.     Gait: Gait normal.     Deep Tendon Reflexes: Reflexes normal.  Psychiatric:        Mood and Affect: Mood normal.        Behavior: Behavior normal.        Thought Content: Thought content normal.        Judgment: Judgment normal.       Assessment & Plan:  1. Routine general medical examination at a health care facility  - CBC with Differential/Platelet - Comprehensive metabolic panel - Hemoglobin A1c - Lipid panel - TSH  2. Controlled type 2 diabetes mellitus with complication, without long-term current use of insulin (HCC) - Consider metformin  - Likely follow up in 6 months  - CBC with Differential/Platelet - Comprehensive metabolic panel - Hemoglobin A1c - Lipid panel - TSH  3. Essential hypertension - Well controlled on current medication - CBC with Differential/Platelet - Comprehensive metabolic panel - Hemoglobin A1c - Lipid panel - TSH  4. Primary osteoarthritis of both knees - Continue with Naproxen as needed  5.  Dyslipidemia - Continue with Simvastatin  - CBC with Differential/Platelet - Comprehensive metabolic panel - Hemoglobin A1c - Lipid panel - TSH  6. Estrogen deficiency  - Vitamin D, 25-hydroxy - DG Bone Density; Future  [name], NP

## 2019-07-28 NOTE — Addendum Note (Signed)
Addended by: Miles Costain T on: 07/28/2019 11:25 AM   Modules accepted: Orders

## 2019-08-03 ENCOUNTER — Other Ambulatory Visit: Payer: Self-pay

## 2019-08-03 ENCOUNTER — Ambulatory Visit (INDEPENDENT_AMBULATORY_CARE_PROVIDER_SITE_OTHER)
Admission: RE | Admit: 2019-08-03 | Discharge: 2019-08-03 | Disposition: A | Discharge status: Home / Self Care | Payer: Medicare Other | Source: Ambulatory Visit

## 2019-08-03 DIAGNOSIS — E2839 Other primary ovarian failure: Secondary | ICD-10-CM

## 2019-08-04 ENCOUNTER — Other Ambulatory Visit: Payer: Self-pay | Admitting: Family Medicine

## 2019-08-05 ENCOUNTER — Other Ambulatory Visit: Payer: Self-pay | Admitting: Adult Health

## 2019-08-05 DIAGNOSIS — E785 Hyperlipidemia, unspecified: Secondary | ICD-10-CM

## 2019-08-11 ENCOUNTER — Telehealth: Payer: Self-pay

## 2019-08-11 NOTE — Telephone Encounter (Signed)
Copied from [place] [id number]. Topic: Referral - Request for Referral >> Aug 11, 2019  8:33 AM Rainey Pines A wrote: Has patient seen PCP for this complaint? {Yes *If NO, is insurance requiring patient see PCP for this issue before PCP can refer them? Referral for which specialty: Dentist Preferred provider/office:No preference Reason for referral: Abscess; Patient also would like to speak with nurse in regards to anitbiotic for abcess

## 2019-08-15 NOTE — Telephone Encounter (Signed)
Spoke to the pt and advised that she would need an office visit to be evaluated for medication.  Pt notified me that she seen a dentist on Friday of last week and was given medication.  Advised her to take all medication prescribed and follow up with dentist if not getting better.  Nothing further needed.

## 2019-08-15 NOTE — Telephone Encounter (Signed)
Office visit

## 2019-08-15 NOTE — Telephone Encounter (Signed)
Do you need to see in office or do a virtual?

## 2019-08-28 ENCOUNTER — Other Ambulatory Visit: Payer: Self-pay | Admitting: Adult Health

## 2019-08-29 NOTE — Telephone Encounter (Signed)
Sent to the pharmacy by e-scribe. 

## 2019-08-30 ENCOUNTER — Telehealth: Payer: Self-pay | Admitting: Family Medicine

## 2019-08-30 NOTE — Telephone Encounter (Signed)
Copied from [name] [id number]. Topic: General - Other >> Aug 30, 2019  1:52 PM Mcneil, Ja-Kwan wrote: Reason for CRM: Pt stated she needs to know the name of the medication that she was prescribed for the numbness in her fingers. Pt requests call back

## 2019-08-31 NOTE — Telephone Encounter (Signed)
Likely medrol dose pack ( prednisone)

## 2019-08-31 NOTE — Telephone Encounter (Signed)
Spoke to the pt.  She continues to have numbness.  She stated it is "terrible."  Would like another refill of the steroid.  Please advise.

## 2019-08-31 NOTE — Telephone Encounter (Signed)
She last had prednisone in January. We can do a virtual visit

## 2019-08-31 NOTE — Telephone Encounter (Signed)
Left a message for a return call.

## 2019-08-31 NOTE — Telephone Encounter (Signed)
Pt scheduled for 09/01/2019.  Nothing further needed.

## 2019-09-01 ENCOUNTER — Other Ambulatory Visit: Payer: Self-pay

## 2019-09-01 ENCOUNTER — Telehealth (INDEPENDENT_AMBULATORY_CARE_PROVIDER_SITE_OTHER): Payer: Medicare Other | Admitting: Adult Health

## 2019-09-01 DIAGNOSIS — M5412 Radiculopathy, cervical region: Secondary | ICD-10-CM | POA: Diagnosis not present

## 2019-09-01 MED ORDER — METHYLPREDNISOLONE 4 MG PO TBPK: ORAL_TABLET | ORAL | 0 refills | Status: DC

## 2019-09-01 NOTE — Progress Notes (Signed)
Virtual Visit via Video Note  I connected with Cathy [name] on 09/01/19 at  2:30 PM EDT by a video enabled telemedicine application and verified that I am speaking with the correct person using two identifiers.  Location patient: home Location provider:work or home office Persons participating in the virtual visit: patient, provider  I discussed the limitations of evaluation and management by telemedicine and the availability of in person appointments. The patient expressed understanding and agreed to proceed.   HPI: She is being evaluated today for an acute on chronic issue.  She complains of numbness and tingling in bilateral fingers.  Her symptoms are present in all digits, and are intermittent.  The numbness and tingling has become worse and more frequent over the last 2 weeks.  She does endorse worsening symptoms in the morning and at night.  She denies any pain in her shoulders or numbness and tingling in the arms.  He denies decreased grip strength or loss of range of motion.  Has not experienced any trauma or incident that would have incited her symptoms.  She was last seen for this in October 2019.  X-ray of the cervical spine at this time showed Diffuse cervical spine spondylosis.  She was prescribed a Medrol Dosepak.  She reports that this worked very well she has not had any sensation for the last couple weeks.   ROS: See pertinent positives and negatives per HPI.  Past Medical History:  Diagnosis Date  . Chronic constipation   . Depression   . Diabetes mellitus type II   . DJD (degenerative joint disease)    knees  . Female cystocele   . GERD (gastroesophageal reflux disease)   . History of esophagitis   . History of MRSA infection    recurrent carbuncle  . History of recurrent UTIs   . Hyperlipidemia   . Hypertension   . Urgency of urination     Past Surgical History:  Procedure Laterality Date  . APPENDECTOMY  1980's  . BREAST EXCISIONAL BIOPSY Left 1989  .  COLONOSCOPY  last one 06-18-2015  . INGUINAL HERNIA REPAIR Left 05-10-2001  . LAPAROSCOPIC LYSIS OF ADHESIONS  01/17/2019  . OOPHORECTOMY Right 01/17/2019  . PERINEOPLASTY  01/17/2019  . SALPINGECTOMY Left 01/17/2019  . VAGINAL HYSTERECTOMY  1980's  . VAGINAL PROLAPSE REPAIR N/A 03/02/2016   Procedure: COLOPLAST ANTERIOR  VAULT REPAIR WITH AXIS DERMIS. SACROSPINUS FIXATION, AUGMENTATION WITH AXIS DERMIS;  Surgeon: Carolan Clines, MD;  Location: [facility];  Service: Urology;  Laterality: N/A;    Family History  Problem Relation Age of Onset  . Aneurysm Mother 71       deceased secondary to brain aneurysm  . Liver cancer Father 75       deceased  . Diabetes Other        grandmother  . Colon cancer Neg Hx   . Stomach cancer Neg Hx   . Rectal cancer Neg Hx   . Esophageal cancer Neg Hx   . Breast cancer Neg Hx      Current Outpatient Medications:  .  aspirin 81 MG tablet, Take 81 mg by mouth daily.  , Disp: , Rfl:  .  Calcium Carbonate-Vitamin D (CALTRATE 600+D) 600-400 MG-UNIT per tablet, Take 1 tablet by mouth daily.  , Disp: , Rfl:  .  CRANBERRY PO, Take 2 tablets by mouth daily. , Disp: , Rfl:  .  cyclobenzaprine (FLEXERIL) 5 MG tablet, TAKE 1 TABLET BY MOUTH THREE TIMES DAILY  AS NEEDED FOR MUSCLE SPASM, Disp: 30 tablet, Rfl: 1 .  estradiol (ESTRACE) 0.1 MG/GM vaginal cream, U 0.5 GRAM VAGINALLY HS, Disp: , Rfl: 3 .  fluticasone (FLONASE) 50 MCG/ACT nasal spray, SHAKE LIQUID AND USE 2 SPRAYS IN EACH NOSTRIL DAILY, Disp: 48 g, Rfl: 3 .  glucose blood (ONETOUCH VERIO) test strip, USE TO TEST BLOOD GLUCOSE TWICE DAILY., Disp: 200 each, Rfl: 3 .  lisinopril (ZESTRIL) 10 MG tablet, TAKE 1 TABLET(10 MG) BY MOUTH DAILY, Disp: 90 tablet, Rfl: 0 .  Misc Natural Products (TART CHERRY ADVANCED) CAPS, Take 1 capsule by mouth daily., Disp: , Rfl:  .  naproxen (NAPROSYN) 500 MG tablet, TAKE 1 TABLET(500 MG) BY MOUTH TWICE DAILY AS NEEDED FOR PAIN, Disp: 180 tablet, Rfl: 0 .   nortriptyline (PAMELOR) 10 MG capsule, Take 1 capsule (10 mg total) by mouth at bedtime., Disp: 30 capsule, Rfl: 1 .  ONETOUCH DELICA LANCETS FINE MISC, USE TO CHECK BLOOD SUGAR ONCE DAILY, Disp: 100 each, Rfl: 3 .  polyethylene glycol (MIRALAX / GLYCOLAX) packet, Take 17 g by mouth daily as needed for mild constipation., Disp: , Rfl:  .  simvastatin (ZOCOR) 40 MG tablet, TAKE 1 TABLET BY MOUTH ONCE DAILY AT 6PM, Disp: 90 tablet, Rfl: 0 .  Tetrahydrozoline HCl (VISINE OP), Apply 1 drop to eye daily as needed (RED EYE)., Disp: , Rfl:  .  TURMERIC PO, Take 1 tablet by mouth daily. , Disp: , Rfl:   EXAM:  VITALS per patient if applicable:  GENERAL: alert, oriented, appears well and in no acute distress  HEENT: atraumatic, conjunttiva clear, no obvious abnormalities on inspection of external nose and ears  NECK: normal movements of the head and neck  LUNGS: on inspection no signs of respiratory distress, breathing rate appears normal, no obvious gross SOB, gasping or wheezing  CV: no obvious cyanosis  MS: moves all visible extremities without noticeable abnormality  PSYCH/NEURO: pleasant and cooperative, no obvious depression or anxiety, speech and thought processing grossly intact  ASSESSMENT AND PLAN:  Discussed the following assessment and plan:  1. Cervical radiculopathy -We discussed further imaging such as an MRI or referral to orthopedics or neurosurgery.  She refused at this time.  Advise follow-up if no relief from Medrol Dosepak - methylPREDNISolone (MEDROL DOSEPAK) 4 MG TBPK tablet; Take as directed  Dispense: 21 tablet; Refill: 0  [name], NP      I discussed the assessment and treatment plan with the patient. The patient was provided an opportunity to ask questions and all were answered. The patient agreed with the plan and demonstrated an understanding of the instructions.   The patient was advised to call back or seek an in-person evaluation if the symptoms  worsen or if the condition fails to improve as anticipated.   [name], NP

## 2019-09-03 ENCOUNTER — Other Ambulatory Visit: Payer: Self-pay | Admitting: Adult Health

## 2019-09-03 DIAGNOSIS — E1121 Type 2 diabetes mellitus with diabetic nephropathy: Secondary | ICD-10-CM

## 2019-09-05 NOTE — Telephone Encounter (Signed)
Sent to the pharmacy by e-scribe. 

## 2019-09-10 NOTE — Progress Notes (Signed)
[name] [address] [place], [place] [address] Phone: [phone] Subjective:   [name], am serving as a scribe for Dr. Hulan Saas.   CC: Knee pain follow-up  [id number]   07/17/2019 Viscosupplementation given again today.  Medications today.  Discussed with patient which activities to do which was to avoid.  Discussed posture and ergonomics.  Discussed how the knees will feel full for quite some time.  Follow-up again 8 weeks  Update 09/11/2019 Cathy [name] is a 73 y.o. female coming in with complaint of bilateral knee pain. States that her left knee feels heavy. The right knee has been bothering her since an injection a few visits ago. Knees stiffen when she sits down.      Past Medical History:  Diagnosis Date  . Chronic constipation   . Depression   . Diabetes mellitus type II   . DJD (degenerative joint disease)    knees  . Female cystocele   . GERD (gastroesophageal reflux disease)   . History of esophagitis   . History of MRSA infection    recurrent carbuncle  . History of recurrent UTIs   . Hyperlipidemia   . Hypertension   . Urgency of urination    Past Surgical History:  Procedure Laterality Date  . APPENDECTOMY  1980's  . BREAST EXCISIONAL BIOPSY Left 1989  . COLONOSCOPY  last one 06-18-2015  . INGUINAL HERNIA REPAIR Left 05-10-2001  . LAPAROSCOPIC LYSIS OF ADHESIONS  01/17/2019  . OOPHORECTOMY Right 01/17/2019  . PERINEOPLASTY  01/17/2019  . SALPINGECTOMY Left 01/17/2019  . VAGINAL HYSTERECTOMY  1980's  . VAGINAL PROLAPSE REPAIR N/A 03/02/2016   Procedure: COLOPLAST ANTERIOR  VAULT REPAIR WITH AXIS DERMIS. SACROSPINUS FIXATION, AUGMENTATION WITH AXIS DERMIS;  Surgeon: Carolan Clines, MD;  Location: [facility];  Service: Urology;  Laterality: N/A;   Social History   Socioeconomic History  . Marital status: Single    Spouse name: Not on file  . Number of children: Not on file  .  Years of education: Not on file  . Highest education level: Not on file  Occupational History  . Not on file  Social Needs  . Financial resource strain: Not on file  . Food insecurity    Worry: Not on file    Inability: Not on file  . Transportation needs    Medical: Not on file    Non-medical: Not on file  Tobacco Use  . Smoking status: Former Smoker    Years: 1.00    Types: Cigarettes    Quit date: 02/28/1996    Years since quitting: 23.5  . Smokeless tobacco: Never Used  Substance and Sexual Activity  . Alcohol use: Yes    Alcohol/week: 0.0 standard drinks    Comment: occasional  . Drug use: No  . Sexual activity: Not on file  Lifestyle  . Physical activity    Days per week: Not on file    Minutes per session: Not on file  . Stress: Not on file  Relationships  . Social Herbalist on phone: Not on file    Gets together: Not on file    Attends religious service: Not on file    Active member of club or organization: Not on file    Attends meetings of clubs or organizations: Not on file    Relationship status: Not on file  Other Topics Concern  . Not on file  Social History  Narrative   Single   Former Smoker  -  quit 10 to 11 years ago (light smoker)   Alcohol use-yes     2 children    Occupation: [employer]    Allergies  Allergen Reactions  . Penicillins Hives    Has patient had a PCN reaction causing immediate rash, facial/tongue/throat swelling, SOB or lightheadedness with hypotension: Yes Has patient had a PCN reaction causing severe rash involving mucus membranes or skin necrosis: No Has patient had a PCN reaction that required hospitalization: No Has patient had a PCN reaction occurring within the last 10 years: No If all of the above answers are "NO", then may proceed with Cephalosporin use.    Family History  Problem Relation Age of Onset  . Aneurysm Mother 49       deceased secondary to brain aneurysm  . Liver cancer Father  47       deceased  . Diabetes Other        grandmother  . Colon cancer Neg Hx   . Stomach cancer Neg Hx   . Rectal cancer Neg Hx   . Esophageal cancer Neg Hx   . Breast cancer Neg Hx     Current Outpatient Medications (Endocrine & Metabolic):  .  methylPREDNISolone (MEDROL DOSEPAK) 4 MG TBPK tablet, Take as directed  Current Outpatient Medications (Cardiovascular):  .  lisinopril (ZESTRIL) 10 MG tablet, TAKE 1 TABLET(10 MG) BY MOUTH DAILY .  simvastatin (ZOCOR) 40 MG tablet, TAKE 1 TABLET BY MOUTH ONCE DAILY AT 6PM  Current Outpatient Medications (Respiratory):  .  fluticasone (FLONASE) 50 MCG/ACT nasal spray, SHAKE LIQUID AND USE 2 SPRAYS IN EACH NOSTRIL DAILY  Current Outpatient Medications (Analgesics):  .  aspirin 81 MG tablet, Take 81 mg by mouth daily.   .  naproxen (NAPROSYN) 500 MG tablet, TAKE 1 TABLET(500 MG) BY MOUTH TWICE DAILY AS NEEDED FOR PAIN .  meloxicam (MOBIC) 7.5 MG tablet, Take 1 tablet (7.5 mg total) by mouth daily.   Current Outpatient Medications (Other):  [name]  Calcium Carbonate-Vitamin D (CALTRATE 600+D) 600-400 MG-UNIT per tablet, Take 1 tablet by mouth daily.   [name]  CRANBERRY PO, Take 2 tablets by mouth daily.  .  cyclobenzaprine (FLEXERIL) 5 MG tablet, TAKE 1 TABLET BY MOUTH THREE TIMES DAILY AS NEEDED FOR MUSCLE SPASM .  estradiol (ESTRACE) 0.1 MG/GM vaginal cream, U 0.5 GRAM VAGINALLY HS .  Misc Natural Products (TART CHERRY ADVANCED) CAPS, Take 1 capsule by mouth daily. .  nortriptyline (PAMELOR) 10 MG capsule, Take 1 capsule (10 mg total) by mouth at bedtime. Glory Rosebush DELICA LANCETS FINE MISC, USE TO CHECK BLOOD SUGAR ONCE DAILY .  ONETOUCH VERIO test strip, USE TO TEST BLOOD GLUCOSE TWICE DAILY .  polyethylene glycol (MIRALAX / GLYCOLAX) packet, Take 17 g by mouth daily as needed for mild constipation. .  Tetrahydrozoline HCl (VISINE OP), Apply 1 drop to eye daily as needed (RED EYE). .  TURMERIC PO, Take 1 tablet by mouth daily.     Past  medical history, social, surgical and family history all reviewed in electronic medical record.  No pertanent information unless stated regarding to the chief complaint.   Review of Systems:  No headache, visual changes, nausea, vomiting, diarrhea, constipation, dizziness, abdominal pain, skin rash, fevers, chills, night sweats, chest pain, shortness of breath, mood changes.  Positive muscle aches  Objective  Blood pressure 132/84, height 5\' 2"  (1.575 m), weight 150 lb (68  kg).     General: No apparent distress alert and oriented x3 mood and affect normal, dressed appropriately.  HEENT: Pupils equal, extraocular movements intact  Respiratory: Patient's speak in full sentences and does not appear short of breath  Cardiovascular: No lower extremity edema, non tender, no erythema  Skin: Warm dry intact with no signs of infection or rash on extremities or on axial skeleton.  Abdomen: Soft nontender  Neuro: Cranial nerves II through XII are intact, neurovascularly intact in all extremities with 2+ DTRs and 2+ pulses.  Lymph: No lymphadenopathy of posterior or anterior cervical chain or axillae bilaterally.  Gait antalgic MSK:  tender with full range of motion and good stability and symmetric strength and tone of shoulders, elbows, wrist, hip and ankles bilaterally.  Mild arthritic changes of multiple joints limited range of motion of the left hip. Knee: Bilateral valgus deformity noted. Large thigh to calf ratio.  Tender to palpation over medial and PF joint line.  ROM full in flexion and extension and lower leg rotation. instability with valgus force.  painful patellar compression. Patellar glide with moderate crepitus. Patellar and quadriceps tendons unremarkable. Hamstring and quadriceps strength is normal.  After informed written and verbal consent, patient was seated on exam table. Right knee was prepped with alcohol swab and utilizing anterolateral approach, patient's right knee space  was injected with 4:1  marcaine 0.5%: Kenalog 40mg /dL. Patient tolerated the procedure well without immediate complications.  After informed written and verbal consent, patient was seated on exam table. Left knee was prepped with alcohol swab and utilizing anterolateral approach, patient's left knee space was injected with 4:1  marcaine 0.5%: Kenalog 40mg /dL. Patient tolerated the procedure well without immediate complications.    Impression and Recommendations:     This case required medical decision making of moderate complexity. The above documentation has been reviewed and is accurate and complete [name], DO       Note: This dictation was prepared with Dragon dictation along with smaller phrase technology. Any transcriptional errors that result from this process are unintentional.

## 2019-09-11 ENCOUNTER — Other Ambulatory Visit: Payer: Self-pay

## 2019-09-11 ENCOUNTER — Ambulatory Visit (INDEPENDENT_AMBULATORY_CARE_PROVIDER_SITE_OTHER): Payer: Medicare Other | Admitting: Family Medicine

## 2019-09-11 ENCOUNTER — Encounter: Payer: Self-pay | Admitting: Family Medicine

## 2019-09-11 DIAGNOSIS — M170 Bilateral primary osteoarthritis of knee: Secondary | ICD-10-CM

## 2019-09-11 MED ORDER — MELOXICAM 7.5 MG PO TABS: 7.5000 mg | ORAL_TABLET | Freq: Every day | ORAL | 0 refills | Status: DC

## 2019-09-11 NOTE — Patient Instructions (Signed)
Meloxicam daily as needed See me in 10 weeks

## 2019-09-11 NOTE — Assessment & Plan Note (Signed)
Bilateral injections given today.  Tolerated procedure well.  Increasing.  Continue complaining difficulties with the arthritic changes.  Patient was avoid any type of surgical intervention.  Patient will increase activity as tolerated.  Will not be due for viscosupplementation again until February.  Patient will follow up with me again in 10 weeks.

## 2019-10-01 NOTE — Progress Notes (Signed)
[name] [place],  [address] Phone: [phone] I, Cathy [name], am serving as a scribe for Dr. Hulan Saas.   CC: Left hip pain  [id number]  Cathy [name] is a 73 y.o. female coming in with complaint of left hip pain. Patient has been having pain that radiates into the groin. Was wearing sleeve.  Patient since then now started having increasing discomfort again, points more to the lateral aspect of the hip but does have some groin pain.  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  X-rays were independently visualized by me again today.  Patient is walking a lot and continues to workout on a regular basis.     Past Medical History:  Diagnosis Date  . Chronic constipation   . Depression   . Diabetes mellitus type II   . DJD (degenerative joint disease)    knees  . Female cystocele   . GERD (gastroesophageal reflux disease)   . History of esophagitis   . History of MRSA infection    recurrent carbuncle  . History of recurrent UTIs   . Hyperlipidemia   . Hypertension   . Urgency of urination    Past Surgical History:  Procedure Laterality Date  . APPENDECTOMY  1980's  . BREAST EXCISIONAL BIOPSY Left 1989  . COLONOSCOPY  last one 06-18-2015  . INGUINAL HERNIA REPAIR Left 05-10-2001  . LAPAROSCOPIC LYSIS OF ADHESIONS  01/17/2019  . OOPHORECTOMY Right 01/17/2019  . PERINEOPLASTY  01/17/2019  . SALPINGECTOMY Left 01/17/2019  . VAGINAL HYSTERECTOMY  1980's  . VAGINAL PROLAPSE REPAIR N/A 03/02/2016   Procedure: COLOPLAST ANTERIOR  VAULT REPAIR WITH AXIS DERMIS. SACROSPINUS FIXATION, AUGMENTATION WITH AXIS DERMIS;  Surgeon: Carolan Clines, MD;  Location: [facility];  Service: Urology;  Laterality: N/A;   Social History   Socioeconomic History  . Marital status: Single    Spouse name: Not on file  . Number of children: Not on file   . Years of education: Not on file  . Highest education level: Not on file  Occupational History  . Not on file  Social Needs  . Financial resource strain: Not on file  . Food insecurity    Worry: Not on file    Inability: Not on file  . Transportation needs    Medical: Not on file    Non-medical: Not on file  Tobacco Use  . Smoking status: Former Smoker    Years: 1.00    Types: Cigarettes    Quit date: 02/28/1996    Years since quitting: 23.6  . Smokeless tobacco: Never Used  Substance and Sexual Activity  . Alcohol use: Yes    Alcohol/week: 0.0 standard drinks    Comment: occasional  . Drug use: No  . Sexual activity: Not on file  Lifestyle  . Physical activity    Days per week: Not on file    Minutes per session: Not on file  . Stress: Not on file  Relationships  . Social Herbalist on phone: Not on file    Gets together: Not on file    Attends religious service: Not on file    Active member of club or organization: Not on file    Attends meetings of clubs or organizations: Not on file    Relationship status: Not on file  Other Topics Concern  . Not on file  Social History Narrative   Single   Former Smoker  -  quit 10 to 11 years ago (light smoker)   Alcohol use-yes     2 children    Occupation: [employer]    Allergies  Allergen Reactions  . Penicillins Hives    Has patient had a PCN reaction causing immediate rash, facial/tongue/throat swelling, SOB or lightheadedness with hypotension: Yes Has patient had a PCN reaction causing severe rash involving mucus membranes or skin necrosis: No Has patient had a PCN reaction that required hospitalization: No Has patient had a PCN reaction occurring within the last 10 years: No If all of the above answers are "NO", then may proceed with Cephalosporin use.    Family History  Problem Relation Age of Onset  . Aneurysm Mother 39       deceased secondary to brain aneurysm  . Liver cancer  Father 44       deceased  . Diabetes Other        grandmother  . Colon cancer Neg Hx   . Stomach cancer Neg Hx   . Rectal cancer Neg Hx   . Esophageal cancer Neg Hx   . Breast cancer Neg Hx     Current Outpatient Medications (Endocrine & Metabolic):  .  methylPREDNISolone (MEDROL DOSEPAK) 4 MG TBPK tablet, Take as directed  Current Outpatient Medications (Cardiovascular):  .  lisinopril (ZESTRIL) 10 MG tablet, TAKE 1 TABLET(10 MG) BY MOUTH DAILY .  simvastatin (ZOCOR) 40 MG tablet, TAKE 1 TABLET BY MOUTH ONCE DAILY AT 6PM  Current Outpatient Medications (Respiratory):  .  fluticasone (FLONASE) 50 MCG/ACT nasal spray, SHAKE LIQUID AND USE 2 SPRAYS IN EACH NOSTRIL DAILY  Current Outpatient Medications (Analgesics):  .  aspirin 81 MG tablet, Take 81 mg by mouth daily.   .  meloxicam (MOBIC) 7.5 MG tablet, Take 1 tablet (7.5 mg total) by mouth daily. .  naproxen (NAPROSYN) 500 MG tablet, TAKE 1 TABLET(500 MG) BY MOUTH TWICE DAILY AS NEEDED FOR PAIN   Current Outpatient Medications (Other):  [name]  Calcium Carbonate-Vitamin D (CALTRATE 600+D) 600-400 MG-UNIT per tablet, Take 1 tablet by mouth daily.   [name]  CRANBERRY PO, Take 2 tablets by mouth daily.  .  cyclobenzaprine (FLEXERIL) 5 MG tablet, TAKE 1 TABLET BY MOUTH THREE TIMES DAILY AS NEEDED FOR MUSCLE SPASM .  estradiol (ESTRACE) 0.1 MG/GM vaginal cream, U 0.5 GRAM VAGINALLY HS .  Misc Natural Products (TART CHERRY ADVANCED) CAPS, Take 1 capsule by mouth daily. .  nortriptyline (PAMELOR) 10 MG capsule, Take 1 capsule (10 mg total) by mouth at bedtime. Glory Rosebush DELICA LANCETS FINE MISC, USE TO CHECK BLOOD SUGAR ONCE DAILY .  ONETOUCH VERIO test strip, USE TO TEST BLOOD GLUCOSE TWICE DAILY .  polyethylene glycol (MIRALAX / GLYCOLAX) packet, Take 17 g by mouth daily as needed for mild constipation. .  Tetrahydrozoline HCl (VISINE OP), Apply 1 drop to eye daily as needed (RED EYE). .  TURMERIC PO, Take 1 tablet by mouth daily.      Past medical history, social, surgical and family history all reviewed in electronic medical record.  No pertanent information unless stated regarding to the chief complaint.   Review of Systems:  No headache, visual changes, nausea, vomiting, diarrhea, constipation, dizziness, abdominal pain, skin rash, fevers, chills, night sweats, weight loss, swollen lymph nodes, body aches, joint swelling,  chest pain, shortness of breath, mood changes.  Positive muscle aches  Objective  Blood pressure 128/82, pulse 98, height 5\' 2"  (1.575 m), weight 148 lb (67.1 kg), SpO2 99 %.    General: No apparent distress alert and oriented x3 mood and affect normal, dressed appropriately.  HEENT: Pupils equal, extraocular movements intact  Respiratory: Patient's speak in full sentences and does not appear short of breath  Cardiovascular: Trace lower extremity edema, non tender, no erythema  Skin: Warm dry intact with no signs of infection or rash on extremities or on axial skeleton.  Abdomen: Soft nontender  Neuro: Cranial nerves II through XII are intact, neurovascularly intact in all extremities with 2+ DTRs and 2+ pulses.  Lymph: No lymphadenopathy of posterior or anterior cervical chain or axillae bilaterally.  Gait antalgic MSK:  tender with full range of motion and good stability and symmetric strength and tone of shoulders, elbows, wrist, and ankles bilaterally.  Moderate to severe arthritic changes of multiple joints Left hip exam shows decreased range of motion in all planes with only 5 degrees of internal rotation.  Patient noticed a severely tender to palpation to the lateral aspect greater trochanteric area.  Negative straight leg test.  Seems neurovascular intact distally.   Procedure: Real-time Ultrasound Guided Injection of left  greater trochanteric bursitis secondary to patient's body habitus Device: GE Logiq Q7  Ultrasound guided injection is preferred based studies that show increased duration,  increased effect, greater accuracy, decreased procedural pain, increased response rate, and decreased cost with ultrasound guided versus blind injection.  Verbal informed consent obtained.  Time-out conducted.  Noted no overlying erythema, induration, or other signs of local infection.  Skin prepped in a sterile fashion.  Local anesthesia: Topical Ethyl chloride.  With sterile technique and under real time ultrasound guidance:  Greater trochanteric area was visualized and patient's bursa was noted. A 22-gauge 3 inch needle was inserted and 4 cc of 0.5% Marcaine and 1 cc of Kenalog 40 mg/dL was injected. Pictures taken Completed without difficulty  Pain immediately resolved suggesting accurate placement of the medication.  Advised to call if fevers/chills, erythema, induration, drainage, or persistent bleeding.  Images permanently stored and available for review in the ultrasound unit.  Impression: Technically successful ultrasound guided injection.    Impression and Recommendations:     This case required medical decision making of moderate complexity. The above documentation has been reviewed and is accurate and complete [name], DO       Note: This dictation was prepared with Dragon dictation along with smaller phrase technology. Any transcriptional errors that result from this process are unintentional.

## 2019-10-03 ENCOUNTER — Ambulatory Visit (INDEPENDENT_AMBULATORY_CARE_PROVIDER_SITE_OTHER): Payer: Medicare Other | Admitting: Family Medicine

## 2019-10-03 ENCOUNTER — Encounter: Payer: Self-pay | Admitting: Family Medicine

## 2019-10-03 ENCOUNTER — Ambulatory Visit: Payer: Self-pay

## 2019-10-03 ENCOUNTER — Other Ambulatory Visit: Payer: Self-pay

## 2019-10-03 VITALS — BP 128/82 | HR 98 | Ht 62.0 in | Wt 148.0 lb

## 2019-10-03 DIAGNOSIS — M25552 Pain in left hip: Secondary | ICD-10-CM | POA: Diagnosis not present

## 2019-10-03 DIAGNOSIS — M1612 Unilateral primary osteoarthritis, left hip: Secondary | ICD-10-CM | POA: Insufficient documentation

## 2019-10-03 DIAGNOSIS — M7062 Trochanteric bursitis, left hip: Secondary | ICD-10-CM | POA: Diagnosis not present

## 2019-10-03 NOTE — Patient Instructions (Signed)
Start exercises again on Friday See me again in 4-5 weeks

## 2019-10-03 NOTE — Assessment & Plan Note (Signed)
Patient given injection today, tolerated procedure well, discussed which activities to do which wants to avoid.  Patient is to increase activity as tolerated.  Discussed icing regimen.  Follow-up again in 4 to 8 weeks

## 2019-10-03 NOTE — Assessment & Plan Note (Signed)
If continuing to have pain we will consider the possibility of injection.

## 2019-10-18 ENCOUNTER — Telehealth: Payer: Self-pay | Admitting: Adult Health

## 2019-10-18 MED ORDER — BLOOD GLUCOSE MONITOR KIT: PACK | 0 refills | Status: DC

## 2019-10-18 NOTE — Telephone Encounter (Signed)
Rx sent to the pharmacy by e-scribe.  Nothing further needed.

## 2019-10-18 NOTE — Telephone Encounter (Signed)
Pt states that she no longer has a machine to check her blood sugar so she wants to know if she can have a RX for a new machine and will need all of the pieces to go with it. Pt states that she usually gets this through the office because she doesn't have money to buy one.

## 2019-11-02 ENCOUNTER — Other Ambulatory Visit: Payer: Self-pay

## 2019-11-03 ENCOUNTER — Other Ambulatory Visit: Payer: Medicare Other

## 2019-11-03 ENCOUNTER — Telehealth: Payer: Self-pay | Admitting: Adult Health

## 2019-11-03 NOTE — Telephone Encounter (Signed)
Patient came for labs.  There were no orders in for labs and she wasn't due to have labs done.  Patient is requesting a call back today.

## 2019-11-07 NOTE — Telephone Encounter (Signed)
She is not in need for labs until March for diabetes follow up since we have gone to every 6 months because of well controlled diabetes

## 2019-11-07 NOTE — Telephone Encounter (Signed)
Cathy [name],  I have searched the patient's chart.  I do not see that she is due for any lab work.  A1C due in March.  Please advise.

## 2019-11-08 NOTE — Telephone Encounter (Signed)
Spoke to the pt and apologized for getting the lines of communication crossed.  Advised that she is not due for another visit until March.  Nothing further needed.

## 2019-11-19 NOTE — Progress Notes (Signed)
[name] [place], [place] [address] Phone: [phone] Subjective:   I, Kandace Blitz, am serving as a scribe for Dr. Hulan Saas.  This visit occurred during the SARS-CoV-2 public health emergency.  Safety protocols were in place, including screening questions prior to the visit, additional usage of staff PPE, and extensive cleaning of exam room while observing appropriate contact time as indicated for disinfecting solutions.   I'm seeing this patient by the request  of:  [name], NP    CC: Bilateral knee pain follow-up  [id number]   10/03/2019 Patient given injection today, tolerated procedure well, discussed which activities to do which wants to avoid.  Patient is to increase activity as tolerated.  Discussed icing regimen.  Follow-up again in 4 to 8 weeks  If continuing to have pain we will consider the possibility of injection.  11/20/2019 ROSELIE CIRIGLIANO is a 73 y.o. female coming in with complaint of bilateral knee and hip pain. Knee is usually stiff. States she had no stiffness this morning. Overall doing better.  Patient describes the pain as a dull, throbbing aching pain.  Patient states symptoms can affect daily activities.    Last injections September 11, 2019  Past Medical History:  Diagnosis Date  . Chronic constipation   . Depression   . Diabetes mellitus type II   . DJD (degenerative joint disease)    knees  . Female cystocele   . GERD (gastroesophageal reflux disease)   . History of esophagitis   . History of MRSA infection    recurrent carbuncle  . History of recurrent UTIs   . Hyperlipidemia   . Hypertension   . Urgency of urination    Past Surgical History:  Procedure Laterality Date  . APPENDECTOMY  1980's  . BREAST EXCISIONAL BIOPSY Left 1989  . COLONOSCOPY  last one 06-18-2015  . INGUINAL HERNIA REPAIR Left 05-10-2001  . LAPAROSCOPIC LYSIS OF ADHESIONS  01/17/2019  . OOPHORECTOMY Right  01/17/2019  . PERINEOPLASTY  01/17/2019  . SALPINGECTOMY Left 01/17/2019  . VAGINAL HYSTERECTOMY  1980's  . VAGINAL PROLAPSE REPAIR N/A 03/02/2016   Procedure: COLOPLAST ANTERIOR  VAULT REPAIR WITH AXIS DERMIS. SACROSPINUS FIXATION, AUGMENTATION WITH AXIS DERMIS;  Surgeon: Carolan Clines, MD;  Location: [facility];  Service: Urology;  Laterality: N/A;   Social History   Socioeconomic History  . Marital status: Single    Spouse name: Not on file  . Number of children: Not on file  . Years of education: Not on file  . Highest education level: Not on file  Occupational History  . Not on file  Tobacco Use  . Smoking status: Former Smoker    Years: 1.00    Types: Cigarettes    Quit date: 02/28/1996    Years since quitting: 23.7  . Smokeless tobacco: Never Used  Substance and Sexual Activity  . Alcohol use: Yes    Alcohol/week: 0.0 standard drinks    Comment: occasional  . Drug use: No  . Sexual activity: Not on file  Other Topics Concern  . Not on file  Social History Narrative   Single   Former Smoker  -  quit 10 to 11 years ago (light smoker)   Alcohol use-yes     2 children    Occupation: [employer]    Social Determinants of Health   Financial Resource Strain:   . Difficulty of Paying Living Expenses: Not on file  Food Insecurity:   . Worried About Charity fundraiser in the Last Year: Not on file  . Ran Out of Food in the Last Year: Not on file  Transportation Needs:   . Lack of Transportation (Medical): Not on file  . Lack of Transportation (Non-Medical): Not on file  Physical Activity:   . Days of Exercise per Week: Not on file  . Minutes of Exercise per Session: Not on file  Stress:   . Feeling of Stress : Not on file  Social Connections:   . Frequency of Communication with Friends and Family: Not on file  . Frequency of Social Gatherings with Friends and Family: Not on file  . Attends Religious Services: Not on file  .  Active Member of Clubs or Organizations: Not on file  . Attends Archivist Meetings: Not on file  . Marital Status: Not on file   Allergies  Allergen Reactions  . Penicillins Hives    Has patient had a PCN reaction causing immediate rash, facial/tongue/throat swelling, SOB or lightheadedness with hypotension: Yes Has patient had a PCN reaction causing severe rash involving mucus membranes or skin necrosis: No Has patient had a PCN reaction that required hospitalization: No Has patient had a PCN reaction occurring within the last 10 years: No If all of the above answers are "NO", then may proceed with Cephalosporin use.    Family History  Problem Relation Age of Onset  . Aneurysm Mother 47       deceased secondary to brain aneurysm  . Liver cancer Father 72       deceased  . Diabetes Other        grandmother  . Colon cancer Neg Hx   . Stomach cancer Neg Hx   . Rectal cancer Neg Hx   . Esophageal cancer Neg Hx   . Breast cancer Neg Hx     Current Outpatient Medications (Endocrine & Metabolic):  .  methylPREDNISolone (MEDROL DOSEPAK) 4 MG TBPK tablet, Take as directed  Current Outpatient Medications (Cardiovascular):  .  lisinopril (ZESTRIL) 10 MG tablet, TAKE 1 TABLET(10 MG) BY MOUTH DAILY .  simvastatin (ZOCOR) 40 MG tablet, TAKE 1 TABLET BY MOUTH ONCE DAILY AT 6PM  Current Outpatient Medications (Respiratory):  .  fluticasone (FLONASE) 50 MCG/ACT nasal spray, SHAKE LIQUID AND USE 2 SPRAYS IN EACH NOSTRIL DAILY  Current Outpatient Medications (Analgesics):  .  aspirin 81 MG tablet, Take 81 mg by mouth daily.   .  meloxicam (MOBIC) 7.5 MG tablet, Take 1 tablet (7.5 mg total) by mouth daily. .  naproxen (NAPROSYN) 500 MG tablet, TAKE 1 TABLET(500 MG) BY MOUTH TWICE DAILY AS NEEDED FOR PAIN   Current Outpatient Medications (Other):  .  blood glucose meter kit and supplies KIT, Dispense based on patient and insurance preference. Use once daily to test blood glucose.  (FOR ICD-E11.9). .  Calcium Carbonate-Vitamin D (CALTRATE 600+D) 600-400 MG-UNIT per tablet, Take 1 tablet by mouth daily.   [name]  CRANBERRY PO, Take 2 tablets by mouth daily.  [name]  estradiol (ESTRACE) 0.1 MG/GM vaginal cream, U 0.5 GRAM VAGINALLY HS .  Misc Natural Products (TART CHERRY ADVANCED) CAPS, Take 1 capsule by mouth daily. .  nortriptyline (PAMELOR) 10 MG capsule, Take 1 capsule (10 mg total) by mouth at bedtime. Glory Rosebush DELICA LANCETS FINE MISC, USE TO CHECK BLOOD SUGAR ONCE DAILY .  ONETOUCH VERIO test strip, USE TO TEST BLOOD GLUCOSE TWICE DAILY .  polyethylene  glycol (MIRALAX / GLYCOLAX) packet, Take 17 g by mouth daily as needed for mild constipation. .  Tetrahydrozoline HCl (VISINE OP), Apply 1 drop to eye daily as needed (RED EYE). .  TURMERIC PO, Take 1 tablet by mouth daily.  .  cyclobenzaprine (FLEXERIL) 5 MG tablet, TAKE 1 TABLET BY MOUTH THREE TIMES DAILY AS NEEDED FOR MUSCLE SPASM    Past medical history, social, surgical and family history all reviewed in electronic medical record.  No pertanent information unless stated regarding to the chief complaint.   Review of Systems:  No headache, visual changes, nausea, vomiting, diarrhea, constipation, dizziness, abdominal pain, skin rash, fevers, chills, night sweats, weight loss, swollen lymph nodes, body aches, joint swelling, muscle aches, chest pain, shortness of breath, mood changes.   Objective  Blood pressure (!) 158/90, pulse 82, height 5' 2"  (1.575 m), weight 153 lb (69.4 kg), SpO2 93 %.    General: No apparent distress alert and oriented x3 mood and affect normal, dressed appropriately.  HEENT: Pupils equal, extraocular movements intact  Respiratory: Patient's speak in full sentences and does not appear short of breath  Cardiovascular: No lower extremity edema, non tender, no erythema  Skin: Warm dry intact with no signs of infection or rash on extremities or on axial skeleton.  Abdomen: Soft nontender   Neuro: Cranial nerves II through XII are intact, neurovascularly intact in all extremities with 2+ DTRs and 2+ pulses.  Lymph: No lymphadenopathy of posterior or anterior cervical chain or axillae bilaterally.  Gait antalgic MSK:  tender with limited range of motion secondary to arthritic changes of multiple joints left hip still tender to palpation over the greater trochanteric area but improved from previous exam. knee: Bilateral valgus deformity noted. Large thigh to calf ratio.  Tender to palpation over medial and PF joint line.  ROM full in flexion and extension and lower leg rotation. instability with valgus force.  painful patellar compression. Patellar glide with moderate crepitus. Patellar and quadriceps tendons unremarkable. Hamstring and quadriceps strength is normal.  Back exam does have some degenerative scoliosis noted.  Tender to palpation right greater than left.  Tightness with right straight leg test   After informed written and verbal consent, patient was seated on exam table. Right knee was prepped with alcohol swab and utilizing anterolateral approach, patient's right knee space was injected with 4:1  marcaine 0.5%: Kenalog 20m/dL. Patient tolerated the procedure well without immediate complications.  After informed written and verbal consent, patient was seated on exam table. Left knee was prepped with alcohol swab and utilizing anterolateral approach, patient's left knee space was injected with 4:1  marcaine 0.5%: Kenalog 442mdL. Patient tolerated the procedure well without immediate complications.   Impression and Recommendations:     This case required medical decision making of moderate complexity. The above documentation has been reviewed and is accurate and complete ZaLyndal PulleyDO       Note: This dictation was prepared with Dragon dictation along with smaller phrase technology. Any transcriptional errors that result from this process are  unintentional.

## 2019-11-20 ENCOUNTER — Encounter: Payer: Self-pay | Admitting: Family Medicine

## 2019-11-20 ENCOUNTER — Ambulatory Visit (INDEPENDENT_AMBULATORY_CARE_PROVIDER_SITE_OTHER): Payer: Medicare Other | Admitting: Family Medicine

## 2019-11-20 ENCOUNTER — Other Ambulatory Visit: Payer: Self-pay

## 2019-11-20 DIAGNOSIS — M7062 Trochanteric bursitis, left hip: Secondary | ICD-10-CM

## 2019-11-20 DIAGNOSIS — M170 Bilateral primary osteoarthritis of knee: Secondary | ICD-10-CM

## 2019-11-20 DIAGNOSIS — G8929 Other chronic pain: Secondary | ICD-10-CM | POA: Diagnosis not present

## 2019-11-20 DIAGNOSIS — M5441 Lumbago with sciatica, right side: Secondary | ICD-10-CM | POA: Diagnosis not present

## 2019-11-20 MED ORDER — CYCLOBENZAPRINE HCL 5 MG PO TABS: ORAL_TABLET | ORAL | 1 refills | Status: DC

## 2019-11-20 NOTE — Assessment & Plan Note (Signed)
Refilled cyclobenzaprine.

## 2019-11-20 NOTE — Patient Instructions (Addendum)
Good to see you  Double up nortriptyline for back See me again in 4-5 weeks for hip injection

## 2019-11-20 NOTE — Assessment & Plan Note (Signed)
Bilateral injections given again today.  Tolerated the procedure well.  NSAID given, icing regimen, refill muscle relaxer, patient wants to avoid any surgical intervention.  Follow-up again on the knees in 10 to 12 weeks

## 2019-11-20 NOTE — Assessment & Plan Note (Signed)
Can repeat injection in 4 to 5 weeks

## 2019-12-17 ENCOUNTER — Ambulatory Visit: Payer: Medicare Other | Attending: Internal Medicine

## 2019-12-17 DIAGNOSIS — Z23 Encounter for immunization: Secondary | ICD-10-CM | POA: Insufficient documentation

## 2019-12-18 NOTE — Progress Notes (Signed)
   Covid-19 Vaccination Clinic  Name:  Cathy [name]    MRN: [id number] DOB: 04-15-1946  12/17/2019  Cathy [name] was observed post Covid-19 immunization for 15 minutes without incidence. She was provided with Vaccine Information Sheet and instruction to access the V-Safe system.   Cathy [name] was instructed to call 911 with any severe reactions post vaccine: [name] Difficulty breathing  . Swelling of your face and throat  . A fast heartbeat  . A bad rash all over your body  . Dizziness and weakness    Immunizations Administered    Name Date Dose VIS Date Route   Moderna COVID-19 Vaccine 12/17/2019  1:59 PM 0.5 mL 10/24/2019 Intramuscular   Manufacturer: Levan Hurst   Lot: [id number]   [place]:[id number]      Documented on behalf of: K. Quentin Cornwall

## 2019-12-19 ENCOUNTER — Other Ambulatory Visit: Payer: Self-pay

## 2019-12-19 ENCOUNTER — Ambulatory Visit (INDEPENDENT_AMBULATORY_CARE_PROVIDER_SITE_OTHER): Payer: Medicare Other | Admitting: Family Medicine

## 2019-12-19 ENCOUNTER — Encounter: Payer: Self-pay | Admitting: Family Medicine

## 2019-12-19 VITALS — BP 120/78 | HR 61 | Ht 62.0 in | Wt 153.0 lb

## 2019-12-19 DIAGNOSIS — M7062 Trochanteric bursitis, left hip: Secondary | ICD-10-CM

## 2019-12-19 DIAGNOSIS — M7061 Trochanteric bursitis, right hip: Secondary | ICD-10-CM

## 2019-12-19 DIAGNOSIS — M5441 Lumbago with sciatica, right side: Secondary | ICD-10-CM

## 2019-12-19 DIAGNOSIS — G8929 Other chronic pain: Secondary | ICD-10-CM | POA: Diagnosis not present

## 2019-12-19 MED ORDER — TRAZODONE HCL 50 MG PO TABS: 25.0000 mg | ORAL_TABLET | Freq: Every evening | ORAL | 3 refills | Status: DC | PRN

## 2019-12-19 NOTE — Progress Notes (Signed)
[place] Phone: [phone] Subjective:   [name], am serving as a scribe for Dr. Hulan Saas. This visit occurred during the SARS-CoV-2 public health emergency.  Safety protocols were in place, including screening questions prior to the visit, additional usage of staff PPE, and extensive cleaning of exam room while observing appropriate contact time as indicated for disinfecting solutions.    I'm seeing this patient by the request  of:  [name], NP  CC: Bilateral hip pain  [id number]   11/20/2019 Bilateral injections given again today.  Tolerated the procedure well.  NSAID given, icing regimen, refill muscle relaxer, patient wants to avoid any surgical intervention.  Follow-up again on the knees in 10 to 12 weeks this was for knees previously  Upate 12/19/2019 Cathy [name] is a 74 y.o. female coming in with complaint of bilateral hip pain. Is here for injections in both hips. L>R.  Worsening pain in the moment.  Waking her up at night.  Patient does have some lumbar spinal stenosis that has contributed to some of the pain in the past.  Patient feels though this is more localized to the lateral aspect of the hip with minimal radicular symptoms at the moment.  Affecting daily activities.      Past Medical History:  Diagnosis Date  . Chronic constipation   . Depression   . Diabetes mellitus type II   . DJD (degenerative joint disease)    knees  . Female cystocele   . GERD (gastroesophageal reflux disease)   . History of esophagitis   . History of MRSA infection    recurrent carbuncle  . History of recurrent UTIs   . Hyperlipidemia   . Hypertension   . Urgency of urination    Past Surgical History:  Procedure Laterality Date  . APPENDECTOMY  1980's  . BREAST EXCISIONAL BIOPSY Left 1989  . COLONOSCOPY  last one 06-18-2015  . INGUINAL HERNIA REPAIR Left 05-10-2001  . LAPAROSCOPIC  LYSIS OF ADHESIONS  01/17/2019  . OOPHORECTOMY Right 01/17/2019  . PERINEOPLASTY  01/17/2019  . SALPINGECTOMY Left 01/17/2019  . VAGINAL HYSTERECTOMY  1980's  . VAGINAL PROLAPSE REPAIR N/A 03/02/2016   Procedure: COLOPLAST ANTERIOR  VAULT REPAIR WITH AXIS DERMIS. SACROSPINUS FIXATION, AUGMENTATION WITH AXIS DERMIS;  Surgeon: Carolan Clines, MD;  Location: [facility];  Service: Urology;  Laterality: N/A;   Social History   Socioeconomic History  . Marital status: Single    Spouse name: Not on file  . Number of children: Not on file  . Years of education: Not on file  . Highest education level: Not on file  Occupational History  . Not on file  Tobacco Use  . Smoking status: Former Smoker    Years: 1.00    Types: Cigarettes    Quit date: 02/28/1996    Years since quitting: 23.8  . Smokeless tobacco: Never Used  Substance and Sexual Activity  . Alcohol use: Yes    Alcohol/week: 0.0 standard drinks    Comment: occasional  . Drug use: [name]  . Sexual activity: Not on file  Other Topics Concern  . Not on file  Social History Narrative   Single   Former Smoker  -  quit 10 to 11 years ago (light smoker)   Alcohol use-yes     2 children    Occupation: [employer]    Social Determinants of Health  Financial Resource Strain:   . Difficulty of Paying Living Expenses: Not on file  Food Insecurity:   . Worried About Charity fundraiser in the Last Year: Not on file  . Ran Out of Food in the Last Year: Not on file  Transportation Needs:   . Lack of Transportation (Medical): Not on file  . Lack of Transportation (Non-Medical): Not on file  Physical Activity:   . Days of Exercise per Week: Not on file  . Minutes of Exercise per Session: Not on file  Stress:   . Feeling of Stress : Not on file  Social Connections:   . Frequency of Communication with Friends and Family: Not on file  . Frequency of Social Gatherings with Friends and Family: Not on  file  . Attends Religious Services: Not on file  . Active Member of Clubs or Organizations: Not on file  . Attends Archivist Meetings: Not on file  . Marital Status: Not on file   Allergies  Allergen Reactions  . Penicillins Hives    Has patient had a PCN reaction causing immediate rash, facial/tongue/throat swelling, SOB or lightheadedness with hypotension: Yes Has patient had a PCN reaction causing severe rash involving mucus membranes or skin necrosis: [name] Has patient had a PCN reaction that required hospitalization: [name] Has patient had a PCN reaction occurring within the last 10 years: [name] If all of the above answers are "[name]", then may proceed with Cephalosporin use.    Family History  Problem Relation Age of Onset  . Aneurysm Mother 53       deceased secondary to brain aneurysm  . Liver cancer Father 79       deceased  . Diabetes Other        grandmother  . Colon cancer Neg Hx   . Stomach cancer Neg Hx   . Rectal cancer Neg Hx   . Esophageal cancer Neg Hx   . Breast cancer Neg Hx     Current Outpatient Medications (Endocrine & Metabolic):  .  methylPREDNISolone (MEDROL DOSEPAK) 4 MG TBPK tablet, Take as directed  Current Outpatient Medications (Cardiovascular):  .  lisinopril (ZESTRIL) 10 MG tablet, TAKE 1 TABLET(10 MG) BY MOUTH DAILY .  simvastatin (ZOCOR) 40 MG tablet, TAKE 1 TABLET BY MOUTH ONCE DAILY AT 6PM  Current Outpatient Medications (Respiratory):  .  fluticasone (FLONASE) 50 MCG/ACT nasal spray, SHAKE LIQUID AND USE 2 SPRAYS IN EACH NOSTRIL DAILY  Current Outpatient Medications (Analgesics):  .  aspirin 81 MG tablet, Take 81 mg by mouth daily.   .  meloxicam (MOBIC) 7.5 MG tablet, Take 1 tablet (7.5 mg total) by mouth daily. .  naproxen (NAPROSYN) 500 MG tablet, TAKE 1 TABLET(500 MG) BY MOUTH TWICE DAILY AS NEEDED FOR PAIN   Current Outpatient Medications (Other):  .  blood glucose meter kit and supplies KIT, Dispense based on patient and  insurance preference. Use once daily to test blood glucose. (FOR ICD-E11.9). .  Calcium Carbonate-Vitamin D (CALTRATE 600+D) 600-400 MG-UNIT per tablet, Take 1 tablet by mouth daily.   [name]  CRANBERRY PO, Take 2 tablets by mouth daily.  .  cyclobenzaprine (FLEXERIL) 5 MG tablet, TAKE 1 TABLET BY MOUTH THREE TIMES DAILY AS NEEDED FOR MUSCLE SPASM .  estradiol (ESTRACE) 0.1 MG/GM vaginal cream, U 0.5 GRAM VAGINALLY HS .  Misc Natural Products (TART CHERRY ADVANCED) CAPS, Take 1 capsule by mouth daily. .  nortriptyline (PAMELOR) 10 MG capsule, Take 1 capsule (10 mg  total) by mouth at bedtime. Glory Rosebush DELICA LANCETS FINE MISC, USE TO CHECK BLOOD SUGAR ONCE DAILY .  ONETOUCH VERIO test strip, USE TO TEST BLOOD GLUCOSE TWICE DAILY .  polyethylene glycol (MIRALAX / GLYCOLAX) packet, Take 17 g by mouth daily as needed for mild constipation. .  Tetrahydrozoline HCl (VISINE OP), Apply 1 drop to eye daily as needed (RED EYE). .  TURMERIC PO, Take 1 tablet by mouth daily.  .  traZODone (DESYREL) 50 MG tablet, Take 0.5-1 tablets (25-50 mg total) by mouth at bedtime as needed for sleep.   Reviewed prior external information including notes and imaging from  primary care provider As well as notes that were available from care everywhere and other healthcare systems.  Past medical history, social, surgical and family history all reviewed in electronic medical record.  [name] pertanent information unless stated regarding to the chief complaint.   Review of Systems:  [name] headache, visual changes, nausea, vomiting, diarrhea, constipation, dizziness, abdominal pain, skin rash, fevers, chills, night sweats, weight loss, swollen lymph nodes, , joint swelling, chest pain, shortness of breath, mood changes. POSITIVE muscle aches, body aches  Objective  Blood pressure 120/78, pulse 61, height 5' 2"  (1.575 m), weight 153 lb (69.4 kg), SpO2 99 %.   General: [name] apparent distress alert and oriented x3 mood and affect  normal, dressed appropriately.  HEENT: Pupils equal, extraocular movements intact  Respiratory: Patient's speak in full sentences and does not appear short of breath  Cardiovascular: [name] lower extremity edema, non tender, [name] erythema  Skin: Warm dry intact with [name] signs of infection or rash on extremities or on axial skeleton.  Abdomen: Soft nontender  Neuro: Cranial nerves II through XII are intact, neurovascularly intact in all extremities with 2+ DTRs and 2+ pulses.  Lymph: [name] lymphadenopathy of posterior or anterior cervical chain or axillae bilaterally.  Gait severely antalgic MSK: Arthritic changes of multiple joints  Bilateral hip exam shows decreased range of motion in all planes.  Patient does have severe tenderness to palpation over the greater trochanteric area bilaterally right greater than left.  Patient has positive Corky Sox bilaterally negative straight leg test.   Procedure: Real-time Ultrasound Guided Injection of right greater trochanteric bursitis secondary to patient's body habitus Device: GE Logiq Q7 Ultrasound guided injection is preferred based studies that show increased duration, increased effect, greater accuracy, decreased procedural pain, increased response rate, and decreased cost with ultrasound guided versus blind injection.  Verbal informed consent obtained.  Time-out conducted.  Noted [name] overlying erythema, induration, or other signs of local infection.  Skin prepped in a sterile fashion.  Local anesthesia: Topical Ethyl chloride.  With sterile technique and under real time ultrasound guidance:  Greater trochanteric area was visualized and patient's bursa was noted. A 22-gauge 3 inch needle was inserted and 4 cc of 0.5% Marcaine and 1 cc of Kenalog 40 mg/dL was injected. Pictures taken Completed without difficulty  Pain immediately resolved suggesting accurate placement of the medication.  Advised to call if fevers/chills, erythema, induration, drainage, or  persistent bleeding.  Images unable to be stored Impression: Technically successful ultrasound guided injection.   Procedure: Real-time Ultrasound Guided Injection of left  greater trochanteric bursitis secondary to patient's body habitus Device: GE Logiq Q7  Ultrasound guided injection is preferred based studies that show increased duration, increased effect, greater accuracy, decreased procedural pain, increased response rate, and decreased cost with ultrasound guided versus blind injection.  Verbal informed consent obtained.  Time-out conducted.  Noted [name] overlying erythema, induration, or other signs of local infection.  Skin prepped in a sterile fashion.  Local anesthesia: Topical Ethyl chloride.  With sterile technique and under real time ultrasound guidance:  Greater trochanteric area was visualized and patient's bursa was noted. A 22-gauge 3 inch needle was inserted and 4 cc of 0.5% Marcaine and 1 cc of Kenalog 40 mg/dL was injected. Pictures taken Completed without difficulty  Pain immediately resolved suggesting accurate placement of the medication.  Advised to call if fevers/chills, erythema, induration, drainage, or persistent bleeding.  Images unable to be stored Impression: Technically successful ultrasound guided injection.    Impression and Recommendations:     This case required medical decision making of moderate complexity. The above documentation has been reviewed and is accurate and complete [name], DO       Note: This dictation was prepared with Dragon dictation along with smaller phrase technology. Any transcriptional errors that result from this process are unintentional.

## 2019-12-19 NOTE — Patient Instructions (Signed)
Injected both hips See me again in

## 2019-12-19 NOTE — Assessment & Plan Note (Signed)
Bilateral injections given today.  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  Patient having difficulty with sleep.  This is a chronic illness with an exacerbation.  Medication changes noted today.  Due to patient's limited social determinants of health secondary to transportation and financial is unable to do physical therapy.  Follow-up again in 8 to 10 weeks

## 2019-12-27 ENCOUNTER — Other Ambulatory Visit: Payer: Self-pay | Admitting: Adult Health

## 2020-01-08 ENCOUNTER — Telehealth: Payer: Self-pay | Admitting: Adult Health

## 2020-01-08 ENCOUNTER — Other Ambulatory Visit: Payer: Self-pay | Admitting: Family Medicine

## 2020-01-08 NOTE — Telephone Encounter (Signed)
Pt would like a call about her lisinopril she said they now come in tiny pills and she does not feel as if they are doing anything, should she double up? She also wants advice on her nose one nostril when she blows there is blood.   Patient Phone- [phone]

## 2020-01-09 ENCOUNTER — Other Ambulatory Visit: Payer: Self-pay

## 2020-01-09 ENCOUNTER — Telehealth (INDEPENDENT_AMBULATORY_CARE_PROVIDER_SITE_OTHER): Payer: Medicare Other | Admitting: Adult Health

## 2020-01-09 DIAGNOSIS — I10 Essential (primary) hypertension: Secondary | ICD-10-CM

## 2020-01-09 DIAGNOSIS — R040 Epistaxis: Secondary | ICD-10-CM

## 2020-01-09 NOTE — Progress Notes (Signed)
Virtual Visit via Video Note  I connected with Cathy [name] on 01/09/20 at  3:30 PM EST by a video enabled telemedicine application and verified that I am speaking with the correct person using two identifiers.  Location patient: home Location provider:work or home office Persons participating in the virtual visit: patient, provider  I discussed the limitations of evaluation and management by telemedicine and the availability of in person appointments. The patient expressed understanding and agreed to proceed.   HPI: 74 year old female who is being evaluated today for 2 acute concerns.  Her first concern is that when she received her refill lisinopril 10 mg the shape of the pill was different than what she is normally accustomed to.  She is concerned that this medication will not work the same way that her old prescription lisinopril.  He has monitored her blood pressure once since receiving the new prescription and reports a reading of 135/78.  Her other concern that of a small amount of blood in her nose.  She first noticed this a week or 2 ago.  She is using Flonase on a daily basis.  She denies nasal pain, sinus pain or pressure, or feeling acutely ill  BP Readings from Last 3 Encounters:  12/19/19 120/78  11/20/19 (!) 158/90  10/03/19 128/82      ROS: See pertinent positives and negatives per HPI.  Past Medical History:  Diagnosis Date  . Chronic constipation   . Depression   . Diabetes mellitus type II   . DJD (degenerative joint disease)    knees  . Female cystocele   . GERD (gastroesophageal reflux disease)   . History of esophagitis   . History of MRSA infection    recurrent carbuncle  . History of recurrent UTIs   . Hyperlipidemia   . Hypertension   . Urgency of urination     Past Surgical History:  Procedure Laterality Date  . APPENDECTOMY  1980's  . BREAST EXCISIONAL BIOPSY Left 1989  . COLONOSCOPY  last one 06-18-2015  . INGUINAL HERNIA REPAIR Left  05-10-2001  . LAPAROSCOPIC LYSIS OF ADHESIONS  01/17/2019  . OOPHORECTOMY Right 01/17/2019  . PERINEOPLASTY  01/17/2019  . SALPINGECTOMY Left 01/17/2019  . VAGINAL HYSTERECTOMY  1980's  . VAGINAL PROLAPSE REPAIR N/A 03/02/2016   Procedure: COLOPLAST ANTERIOR  VAULT REPAIR WITH AXIS DERMIS. SACROSPINUS FIXATION, AUGMENTATION WITH AXIS DERMIS;  Surgeon: Carolan Clines, MD;  Location: [facility];  Service: Urology;  Laterality: N/A;    Family History  Problem Relation Age of Onset  . Aneurysm Mother 55       deceased secondary to brain aneurysm  . Liver cancer Father 42       deceased  . Diabetes Other        grandmother  . Colon cancer Neg Hx   . Stomach cancer Neg Hx   . Rectal cancer Neg Hx   . Esophageal cancer Neg Hx   . Breast cancer Neg Hx        Current Outpatient Medications:  .  aspirin 81 MG tablet, Take 81 mg by mouth daily.  , Disp: , Rfl:  .  blood glucose meter kit and supplies KIT, Dispense based on patient and insurance preference. Use once daily to test blood glucose. (FOR ICD-E11.9)., Disp: 1 each, Rfl: 0 .  Calcium Carbonate-Vitamin D (CALTRATE 600+D) 600-400 MG-UNIT per tablet, Take 1 tablet by mouth daily.  , Disp: , Rfl:  .  CRANBERRY PO, Take 2 tablets  by mouth daily. , Disp: , Rfl:  .  cyclobenzaprine (FLEXERIL) 5 MG tablet, TAKE 1 TABLET BY MOUTH THREE TIMES DAILY AS NEEDED FOR MUSCLE SPASM, Disp: 30 tablet, Rfl: 1 .  estradiol (ESTRACE) 0.1 MG/GM vaginal cream, U 0.5 GRAM VAGINALLY HS, Disp: , Rfl: 3 .  fluticasone (FLONASE) 50 MCG/ACT nasal spray, SHAKE LIQUID AND USE 2 SPRAYS IN EACH NOSTRIL DAILY, Disp: 48 g, Rfl: 3 .  lisinopril (ZESTRIL) 10 MG tablet, TAKE 1 TABLET(10 MG) BY MOUTH DAILY, Disp: 90 tablet, Rfl: 0 .  meloxicam (MOBIC) 7.5 MG tablet, Take 1 tablet (7.5 mg total) by mouth daily., Disp: 30 tablet, Rfl: 0 .  methylPREDNISolone (MEDROL DOSEPAK) 4 MG TBPK tablet, Take as directed, Disp: 21 tablet, Rfl: 0 .  Misc Natural  Products (TART CHERRY ADVANCED) CAPS, Take 1 capsule by mouth daily., Disp: , Rfl:  .  naproxen (NAPROSYN) 500 MG tablet, TAKE 1 TABLET(500 MG) BY MOUTH TWICE DAILY AS NEEDED FOR PAIN, Disp: 180 tablet, Rfl: 0 .  nortriptyline (PAMELOR) 10 MG capsule, Take 1 capsule (10 mg total) by mouth at bedtime., Disp: 30 capsule, Rfl: 1 .  ONETOUCH DELICA LANCETS FINE MISC, USE TO CHECK BLOOD SUGAR ONCE DAILY, Disp: 100 each, Rfl: 3 .  ONETOUCH VERIO test strip, USE TO TEST BLOOD GLUCOSE TWICE DAILY, Disp: 200 strip, Rfl: 3 .  polyethylene glycol (MIRALAX / GLYCOLAX) packet, Take 17 g by mouth daily as needed for mild constipation., Disp: , Rfl:  .  simvastatin (ZOCOR) 40 MG tablet, TAKE 1 TABLET BY MOUTH ONCE DAILY AT 6PM, Disp: 90 tablet, Rfl: 0 .  Tetrahydrozoline HCl (VISINE OP), Apply 1 drop to eye daily as needed (RED EYE)., Disp: , Rfl:  .  traZODone (DESYREL) 50 MG tablet, Take 0.5-1 tablets (25-50 mg total) by mouth at bedtime as needed for sleep., Disp: 30 tablet, Rfl: 3 .  TURMERIC PO, Take 1 tablet by mouth daily. , Disp: , Rfl:   EXAM:  VITALS per patient if applicable:  GENERAL: alert, oriented, appears well and in no acute distress  HEENT: atraumatic, conjunttiva clear, no obvious abnormalities on inspection of external nose and ears  NECK: normal movements of the head and neck  LUNGS: on inspection no signs of respiratory distress, breathing rate appears normal, no obvious gross SOB, gasping or wheezing  CV: no obvious cyanosis  MS: moves all visible extremities without noticeable abnormality  PSYCH/NEURO: pleasant and cooperative, no obvious depression or anxiety, speech and thought processing grossly intact  ASSESSMENT AND PLAN:  Discussed the following assessment and plan:  1. Essential hypertension -Informed her that the medication is the same strength stress produced by different manufacturer.  She can continue to monitor her blood pressure at home and let me know if it is  not within normal limits.  2. Bleeding from the nose -Likely from dry weather as well as Flonase.  Advised her to use normal saline nasal spray.  Follow-up as needed      I discussed the assessment and treatment plan with the patient. The patient was provided an opportunity to ask questions and all were answered. The patient agreed with the plan and demonstrated an understanding of the instructions.   The patient was advised to call back or seek an in-person evaluation if the symptoms worsen or if the condition fails to improve as anticipated.   [name], NP

## 2020-01-09 NOTE — Telephone Encounter (Signed)
Pt has been scheduled for virtual visit  

## 2020-01-13 ENCOUNTER — Other Ambulatory Visit: Payer: Self-pay | Admitting: Adult Health

## 2020-01-14 ENCOUNTER — Ambulatory Visit: Payer: Medicare Other | Attending: Internal Medicine

## 2020-01-14 DIAGNOSIS — Z23 Encounter for immunization: Secondary | ICD-10-CM | POA: Insufficient documentation

## 2020-01-14 NOTE — Progress Notes (Signed)
   Covid-19 Vaccination Clinic  Name:  Cathy [name]    MRN: [id number] DOB: [birth year]  01/14/2020  [name] was observed post Covid-19 immunization for 15 minutes without incidence. She was provided with Vaccine Information Sheet and instruction to access the V-Safe system.   [name] was instructed to call 911 with any severe reactions post vaccine: [name] Difficulty breathing  . Swelling of your face and throat  . A fast heartbeat  . A bad rash all over your body  . Dizziness and weakness    Immunizations Administered    Name Date Dose VIS Date Route   Moderna COVID-19 Vaccine 01/14/2020  1:00 PM 0.5 mL 10/24/2019 Intramuscular   Manufacturer: Moderna   Lot: [id number]   [id number]

## 2020-01-19 ENCOUNTER — Telehealth: Payer: Self-pay | Admitting: Adult Health

## 2020-01-19 MED ORDER — MELOXICAM 7.5 MG PO TABS: 7.5000 mg | ORAL_TABLET | Freq: Every day | ORAL | 0 refills | Status: DC

## 2020-01-19 NOTE — Telephone Encounter (Signed)
Medication Refill: Meloxicam  Pharmacy: [facility]  Phone:

## 2020-01-22 DIAGNOSIS — N390 Urinary tract infection, site not specified: Secondary | ICD-10-CM | POA: Diagnosis not present

## 2020-01-23 ENCOUNTER — Other Ambulatory Visit: Payer: Self-pay | Admitting: Family Medicine

## 2020-01-23 NOTE — Progress Notes (Signed)
I was reviewing her medication list and it has Mobic ( which I just filled), Naprosyn and now needs the Motrin filled. Please advise her that she is only to be taking the Mobic

## 2020-01-23 NOTE — Progress Notes (Signed)
Confirmed with the pt that she is NOT taking the ibuprofen. Nothing further needed.

## 2020-01-24 ENCOUNTER — Other Ambulatory Visit: Payer: Self-pay

## 2020-01-25 ENCOUNTER — Encounter: Payer: Self-pay | Admitting: Adult Health

## 2020-01-25 ENCOUNTER — Ambulatory Visit (INDEPENDENT_AMBULATORY_CARE_PROVIDER_SITE_OTHER): Payer: Medicare Other | Admitting: Adult Health

## 2020-01-25 VITALS — BP 110/62 | Temp 97.8°F | Wt 142.0 lb

## 2020-01-25 DIAGNOSIS — E118 Type 2 diabetes mellitus with unspecified complications: Secondary | ICD-10-CM | POA: Diagnosis not present

## 2020-01-25 LAB — POCT GLYCOSYLATED HEMOGLOBIN (HGB A1C): HbA1c, POC (controlled diabetic range): 6 % (ref 0.0–7.0)

## 2020-01-25 NOTE — Progress Notes (Signed)
Subjective:    Patient ID: Cathy [name], female    DOB: [birth year], 74 y.o.   MRN: [id number]  HPI 74 year old female who is being evaluated today for 35-monthfollow-up regarding diabetes mellitus.  She has been well controlled in the past via diet.  She checks her blood sugars periodically and reports that they are below 120.  She denies any hypoglycemic events.  She has been walking and eating healthy.   Lab Results  Component Value Date   HGBA1C 6.3 07/28/2019   Review of Systems See HPI   Past Medical History:  Diagnosis Date  . Chronic constipation   . Depression   . Diabetes mellitus type II   . DJD (degenerative joint disease)    knees  . Female cystocele   . GERD (gastroesophageal reflux disease)   . History of esophagitis   . History of MRSA infection    recurrent carbuncle  . History of recurrent UTIs   . Hyperlipidemia   . Hypertension   . Urgency of urination     Social History   Socioeconomic History  . Marital status: Single    Spouse name: Not on file  . Number of children: Not on file  . Years of education: Not on file  . Highest education level: Not on file  Occupational History  . Not on file  Tobacco Use  . Smoking status: Former Smoker    Years: 1.00    Types: Cigarettes    Quit date: 02/28/1996    Years since quitting: 23.9  . Smokeless tobacco: Never Used  Substance and Sexual Activity  . Alcohol use: Yes    Alcohol/week: 0.0 standard drinks    Comment: occasional  . Drug use: No  . Sexual activity: Not on file  Other Topics Concern  . Not on file  Social History Narrative   Single   Former Smoker  -  quit 10 to 11 years ago (light smoker)   Alcohol use-yes     2 children    Occupation: [employer]   Social Determinants of Health   Financial Resource Strain:   . Difficulty of Paying Living Expenses: Not on file  Food Insecurity:   . Worried About RCharity fundraiserin the Last Year: Not on file  . Ran  Out of Food in the Last Year: Not on file  Transportation Needs:   . Lack of Transportation (Medical): Not on file  . Lack of Transportation (Non-Medical): Not on file  Physical Activity:   . Days of Exercise per Week: Not on file  . Minutes of Exercise per Session: Not on file  Stress:   . Feeling of Stress : Not on file  Social Connections:   . Frequency of Communication with Friends and Family: Not on file  . Frequency of Social Gatherings with Friends and Family: Not on file  . Attends Religious Services: Not on file  . Active Member of Clubs or Organizations: Not on file  . Attends CArchivistMeetings: Not on file  . Marital Status: Not on file  Intimate Partner Violence:   . Fear of Current or Ex-Partner: Not on file  . Emotionally Abused: Not on file  . Physically Abused: Not on file  . Sexually Abused: Not on file    Past Surgical History:  Procedure Laterality Date  . APPENDECTOMY  1980's  . BREAST EXCISIONAL BIOPSY Left 1989  . COLONOSCOPY  last one 06-18-2015  .  INGUINAL HERNIA REPAIR Left 05-10-2001  . LAPAROSCOPIC LYSIS OF ADHESIONS  01/17/2019  . OOPHORECTOMY Right 01/17/2019  . PERINEOPLASTY  01/17/2019  . SALPINGECTOMY Left 01/17/2019  . VAGINAL HYSTERECTOMY  1980's  . VAGINAL PROLAPSE REPAIR N/A 03/02/2016   Procedure: COLOPLAST ANTERIOR  VAULT REPAIR WITH AXIS DERMIS. SACROSPINUS FIXATION, AUGMENTATION WITH AXIS DERMIS;  Surgeon: Carolan Clines, MD;  Location: [facility];  Service: Urology;  Laterality: N/A;    Family History  Problem Relation Age of Onset  . Aneurysm Mother 83       deceased secondary to brain aneurysm  . Liver cancer Father 73       deceased  . Diabetes Other        grandmother  . Colon cancer Neg Hx   . Stomach cancer Neg Hx   . Rectal cancer Neg Hx   . Esophageal cancer Neg Hx   . Breast cancer Neg Hx     Allergies  Allergen Reactions  . Penicillins Hives    Has patient had a PCN reaction  causing immediate rash, facial/tongue/throat swelling, SOB or lightheadedness with hypotension: Yes Has patient had a PCN reaction causing severe rash involving mucus membranes or skin necrosis: No Has patient had a PCN reaction that required hospitalization: No Has patient had a PCN reaction occurring within the last 10 years: No If all of the above answers are "NO", then may proceed with Cephalosporin use.     Current Outpatient Medications on File Prior to Visit  Medication Sig Dispense Refill  . aspirin 81 MG tablet Take 81 mg by mouth daily.      . blood glucose meter kit and supplies KIT Dispense based on patient and insurance preference. Use once daily to test blood glucose. (FOR ICD-E11.9). 1 each 0  . Calcium Carbonate-Vitamin D (CALTRATE 600+D) 600-400 MG-UNIT per tablet Take 1 tablet by mouth daily.      [name] CRANBERRY PO Take 2 tablets by mouth daily.     . cyclobenzaprine (FLEXERIL) 5 MG tablet TAKE 1 TABLET BY MOUTH THREE TIMES DAILY AS NEEDED FOR MUSCLE SPASM 30 tablet 1  . estradiol (ESTRACE) 0.1 MG/GM vaginal cream U 0.5 GRAM VAGINALLY HS  3  . fluticasone (FLONASE) 50 MCG/ACT nasal spray SHAKE LIQUID AND USE 2 SPRAYS IN EACH NOSTRIL DAILY 48 g 3  . lisinopril (ZESTRIL) 10 MG tablet TAKE 1 TABLET(10 MG) BY MOUTH DAILY 90 tablet 0  . meloxicam (MOBIC) 7.5 MG tablet Take 1 tablet (7.5 mg total) by mouth daily. 30 tablet 0  . methylPREDNISolone (MEDROL DOSEPAK) 4 MG TBPK tablet Take as directed 21 tablet 0  . Misc Natural Products (TART CHERRY ADVANCED) CAPS Take 1 capsule by mouth daily.    . naproxen (NAPROSYN) 500 MG tablet TAKE 1 TABLET(500 MG) BY MOUTH TWICE DAILY AS NEEDED FOR PAIN 180 tablet 0  . nortriptyline (PAMELOR) 10 MG capsule Take 1 capsule (10 mg total) by mouth at bedtime. 30 capsule 1  . ONETOUCH DELICA LANCETS FINE MISC USE TO CHECK BLOOD SUGAR ONCE DAILY 100 each 3  . ONETOUCH VERIO test strip USE TO TEST BLOOD GLUCOSE TWICE DAILY 200 strip 3  . polyethylene  glycol (MIRALAX / GLYCOLAX) packet Take 17 g by mouth daily as needed for mild constipation.    . simvastatin (ZOCOR) 40 MG tablet TAKE 1 TABLET BY MOUTH ONCE DAILY AT 6PM 90 tablet 0  . Tetrahydrozoline HCl (VISINE OP) Apply 1 drop to eye daily as needed (RED  EYE).    . traZODone (DESYREL) 50 MG tablet Take 0.5-1 tablets (25-50 mg total) by mouth at bedtime as needed for sleep. 30 tablet 3  . TURMERIC PO Take 1 tablet by mouth daily.     . [DISCONTINUED] valsartan (DIOVAN) 160 MG tablet Take 1/2 tablet by mouth once daily 30 tablet 5   No current facility-administered medications on file prior to visit.    There were no vitals taken for this visit.      Objective:   Physical Exam Vitals and nursing note reviewed.  Cardiovascular:     Rate and Rhythm: Normal rate and regular rhythm.     Pulses: Normal pulses.     Heart sounds: Normal heart sounds.  Pulmonary:     Effort: Pulmonary effort is normal.     Breath sounds: Normal breath sounds.  Skin:    General: Skin is warm and dry.  Neurological:     General: No focal deficit present.     Mental Status: She is oriented to person, place, and time.  Psychiatric:        Mood and Affect: Mood normal.        Behavior: Behavior normal.        Thought Content: Thought content normal.        Judgment: Judgment normal.       Assessment & Plan:  1. Controlled type 2 diabetes mellitus with complication, without long-term current use of insulin (HCC) - POCT A1C- 6.0.  - Has improved.  - Continue to eat healthy and exercise  - Follow up in 6 months for CPE   [name], NP

## 2020-02-14 ENCOUNTER — Telehealth: Payer: Self-pay | Admitting: Family Medicine

## 2020-02-14 NOTE — Telephone Encounter (Signed)
Spoke with patient. Offered her earlier appt. Told patient to use Tylenol and ice. Is ok to do water aerobics as long as this isn't increasing pain. Patient will keep appt on April 6th.

## 2020-02-14 NOTE — Telephone Encounter (Signed)
Patient called asking if you would be able to call her to discuss what she has going on. She would not go into detail.

## 2020-02-19 ENCOUNTER — Ambulatory Visit (INDEPENDENT_AMBULATORY_CARE_PROVIDER_SITE_OTHER): Payer: Medicare Other

## 2020-02-19 ENCOUNTER — Encounter: Payer: Self-pay | Admitting: Family Medicine

## 2020-02-19 ENCOUNTER — Other Ambulatory Visit: Payer: Self-pay | Admitting: Family Medicine

## 2020-02-19 ENCOUNTER — Ambulatory Visit (INDEPENDENT_AMBULATORY_CARE_PROVIDER_SITE_OTHER): Payer: Medicare Other | Admitting: Family Medicine

## 2020-02-19 ENCOUNTER — Other Ambulatory Visit: Payer: Self-pay

## 2020-02-19 VITALS — BP 160/80 | HR 110 | Ht 62.0 in | Wt 145.0 lb

## 2020-02-19 DIAGNOSIS — M7062 Trochanteric bursitis, left hip: Secondary | ICD-10-CM | POA: Diagnosis not present

## 2020-02-19 DIAGNOSIS — M1611 Unilateral primary osteoarthritis, right hip: Secondary | ICD-10-CM | POA: Diagnosis not present

## 2020-02-19 DIAGNOSIS — M25551 Pain in right hip: Secondary | ICD-10-CM

## 2020-02-19 DIAGNOSIS — G8929 Other chronic pain: Secondary | ICD-10-CM

## 2020-02-19 DIAGNOSIS — M7061 Trochanteric bursitis, right hip: Secondary | ICD-10-CM

## 2020-02-19 NOTE — Patient Instructions (Addendum)
Good to see you Xray today

## 2020-02-19 NOTE — Progress Notes (Signed)
[place] [address] [place] Phone: [phone] Subjective:   I Cathy [name] am serving as a Education administrator for Dr. Hulan Saas.  This visit occurred during the SARS-CoV-2 public health emergency.  Safety protocols were in place, including screening questions prior to the visit, additional usage of staff PPE, and extensive cleaning of exam room while observing appropriate contact time as indicated for disinfecting solutions.   I'm seeing this patient by the request  of:  [name], NP  CC: Right hand pain follow-up  EVO:[id number]   12/19/2019 Bilateral injections given today.  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  Patient having difficulty with sleep.  This is a chronic illness with an exacerbation.  Medication changes noted today.  Due to patient's limited social determinants of health secondary to transportation and financial is unable to do physical therapy.  Follow-up again in 8 to 10 weeks  02/19/2020 Cathy [name] is a 74 y.o. female coming in with complaint of bilateral hip pain. Patient states the right hip is most painful. Pain in the glut as well. Left is doing better.  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  Now right groin pain seems to be worsening the nothing else some mild pain on the lateral aspect of the right hip.      Past Medical History:  Diagnosis Date  . Chronic constipation   . Depression   . Diabetes mellitus type II   . DJD (degenerative joint disease)    knees  . Female cystocele   . GERD (gastroesophageal reflux disease)   . History of esophagitis   . History of MRSA infection    recurrent carbuncle  . History of recurrent UTIs   . Hyperlipidemia   . Hypertension   . Urgency of urination    Past Surgical History:  Procedure Laterality Date  . APPENDECTOMY  1980's  . BREAST EXCISIONAL BIOPSY Left 1989  .  COLONOSCOPY  last one 06-18-2015  . INGUINAL HERNIA REPAIR Left 05-10-2001  . LAPAROSCOPIC LYSIS OF ADHESIONS  01/17/2019  . OOPHORECTOMY Right 01/17/2019  . PERINEOPLASTY  01/17/2019  . SALPINGECTOMY Left 01/17/2019  . VAGINAL HYSTERECTOMY  1980's  . VAGINAL PROLAPSE REPAIR N/A 03/02/2016   Procedure: COLOPLAST ANTERIOR  VAULT REPAIR WITH AXIS DERMIS. SACROSPINUS FIXATION, AUGMENTATION WITH AXIS DERMIS;  Surgeon: Carolan Clines, MD;  Location: [facility];  Service: Urology;  Laterality: N/A;   Social History   Socioeconomic History  . Marital status: Single    Spouse name: Not on file  . Number of children: Not on file  . Years of education: Not on file  . Highest education level: Not on file  Occupational History  . Not on file  Tobacco Use  . Smoking status: Former Smoker    Years: 1.00    Types: Cigarettes    Quit date: 02/28/1996    Years since quitting: 23.9  . Smokeless tobacco: Never Used  Substance and Sexual Activity  . Alcohol use: Yes    Alcohol/week: 0.0 standard drinks    Comment: occasional  . Drug use: No  . Sexual activity: Not on file  Other Topics Concern  . Not on file  Social History Narrative   Single   Former Smoker  -  quit 10 to 11 years ago (light smoker)   Alcohol use-yes     2 children  Occupation: [employer]    Social Determinants of Health   Financial Resource Strain:   . Difficulty of Paying Living Expenses:   Food Insecurity:   . Worried About Charity fundraiser in the Last Year:   . Arboriculturist in the Last Year:   Transportation Needs:   . Film/video editor (Medical):   [name] Lack of Transportation (Non-Medical):   Physical Activity:   . Days of Exercise per Week:   . Minutes of Exercise per Session:   Stress:   . Feeling of Stress :   Social Connections:   . Frequency of Communication with Friends and Family:   . Frequency of Social Gatherings with Friends and Family:   . Attends  Religious Services:   . Active Member of Clubs or Organizations:   . Attends Archivist Meetings:   [name] Marital Status:    Allergies  Allergen Reactions  . Penicillins Hives    Has patient had a PCN reaction causing immediate rash, facial/tongue/throat swelling, SOB or lightheadedness with hypotension: Yes Has patient had a PCN reaction causing severe rash involving mucus membranes or skin necrosis: No Has patient had a PCN reaction that required hospitalization: No Has patient had a PCN reaction occurring within the last 10 years: No If all of the above answers are "NO", then may proceed with Cephalosporin use.    Family History  Problem Relation Age of Onset  . Aneurysm Mother 80       deceased secondary to brain aneurysm  . Liver cancer Father 85       deceased  . Diabetes Other        grandmother  . Colon cancer Neg Hx   . Stomach cancer Neg Hx   . Rectal cancer Neg Hx   . Esophageal cancer Neg Hx   . Breast cancer Neg Hx      Current Outpatient Medications (Cardiovascular):  .  lisinopril (ZESTRIL) 10 MG tablet, TAKE 1 TABLET(10 MG) BY MOUTH DAILY .  simvastatin (ZOCOR) 40 MG tablet, TAKE 1 TABLET BY MOUTH ONCE DAILY AT 6PM   Current Outpatient Medications (Analgesics):  .  aspirin 81 MG tablet, Take 81 mg by mouth daily.   .  meloxicam (MOBIC) 7.5 MG tablet, Take 1 tablet (7.5 mg total) by mouth daily.   Current Outpatient Medications (Other):  .  blood glucose meter kit and supplies KIT, Dispense based on patient and insurance preference. Use once daily to test blood glucose. (FOR ICD-E11.9). .  Calcium Carbonate-Vitamin D (CALTRATE 600+D) 600-400 MG-UNIT per tablet, Take 1 tablet by mouth daily.   [name]  CRANBERRY PO, Take 2 tablets by mouth daily.  [name]  estradiol (ESTRACE) 0.1 MG/GM vaginal cream, U 0.5 GRAM VAGINALLY HS .  Misc Natural Products (TART CHERRY ADVANCED) CAPS, Take 1 capsule by mouth daily. Glory Rosebush DELICA LANCETS FINE MISC, USE TO CHECK  BLOOD SUGAR ONCE DAILY .  ONETOUCH VERIO test strip, USE TO TEST BLOOD GLUCOSE TWICE DAILY .  polyethylene glycol (MIRALAX / GLYCOLAX) packet, Take 17 g by mouth daily as needed for mild constipation. .  TURMERIC PO, Take 1 tablet by mouth daily.    Reviewed prior external information including notes and imaging from  primary care provider As well as notes that were available from care everywhere and other healthcare systems.  Past medical history, social, surgical and family history all reviewed in electronic medical record.  No pertanent information unless  stated regarding to the chief complaint.   Review of Systems:  No headache, visual changes, nausea, vomiting, diarrhea, constipation, dizziness, abdominal pain, skin rash, fevers, chills, night sweats, weight loss, swollen lymph nodes, body aches, joint swelling, chest pain, shortness of breath, mood changes. POSITIVE muscle aches  Objective  Blood pressure (!) 160/80, pulse (!) 110, height '5\' 2"'$  (1.575 m), weight 145 lb (65.8 kg), SpO2 97 %.   General: No apparent distress alert and oriented x3 mood and affect normal, dressed appropriately.  HEENT: Pupils equal, extraocular movements intact  Respiratory: Patient's speak in full sentences and does not appear short of breath  Cardiovascular: No lower extremity edema, non tender, no erythema  Neuro: Cranial nerves II through XII are intact, neurovascularly intact in all extremities with 2+ DTRs and 2+ pulses.  MSK: Severe antalgic gait Right hip exam shows the patient does have a decrease in range of motion of the right hip compared to previous exam.  Almost as bad as his contralateral hand.  More tenderness noted over the groin area as well as over the greater trochanteric right greater than left.  Unable to do [place] secondary to pain and tightness.   Procedure: Real-time Ultrasound Guided Injection of right greater trochanteric bursitis secondary to patient's body habitus Device: GE  Logiq Q7 Ultrasound guided injection is preferred based studies that show increased duration, increased effect, greater accuracy, decreased procedural pain, increased response rate, and decreased cost with ultrasound guided versus blind injection.  Verbal informed consent obtained.  Time-out conducted.  Noted no overlying erythema, induration, or other signs of local infection.  Skin prepped in a sterile fashion.  Local anesthesia: Topical Ethyl chloride.  With sterile technique and under real time ultrasound guidance:  Greater trochanteric area was visualized and patient's bursa was noted. A 22-gauge 3 inch needle was inserted and 4 cc of 0.5% Marcaine and 1 cc of Kenalog 40 mg/dL was injected. Pictures taken Completed without difficulty  Pain immediately resolved suggesting accurate placement of the medication.  Advised to call if fevers/chills, erythema, induration, drainage, or persistent bleeding.  Images permanently stored and available for review in the ultrasound unit.  Impression: Technically successful ultrasound guided injection.  Procedure: Real-time Ultrasound Guided Injection of right hip Device: GE Logiq Q7  Ultrasound guided injection is preferred based studies that show increased duration, increased effect, greater accuracy, decreased procedural pain, increased response rate with ultrasound guided versus blind injection.  Verbal informed consent obtained.  Time-out conducted.  Noted no overlying erythema, induration, or other signs of local infection.  Skin prepped in a sterile fashion.  Local anesthesia: Topical Ethyl chloride.  With sterile technique and under real time ultrasound guidance:  Anterior capsule visualized, needle visualized going to the head neck junction at the anterior capsule. Pictures taken. Patient did have injection of  3 cc of 0.5% Marcaine, and 1 cc of Kenalog 40 mg/dL. Completed without difficulty  Pain immediately resolved suggesting accurate  placement of the medication.  Advised to call if fevers/chills, erythema, induration, drainage, or persistent bleeding.  Images permanently stored and available for review in the ultrasound unit.  Impression: Technically successful ultrasound guided injection.   Impression and Recommendations:     This case required medical decision making of moderate complexity. The above documentation has been reviewed and is accurate and complete [name], DO       Note: This dictation was prepared with Dragon dictation along with smaller phrase technology. Any transcriptional errors that result from this  process are unintentional.

## 2020-02-19 NOTE — Assessment & Plan Note (Signed)
Right-sided injection today.  Patient has failed all other conservative therapy for this.  We discussed medication management including meloxicam.  Do feel underlying arthritic changes of the hip could be contributing as well.  Discussed icing regimen and home exercises.  Discussed which activities to do which wants to avoid.  Follow-up again in 4 to 8 weeks

## 2020-02-19 NOTE — Assessment & Plan Note (Signed)
Previously undiagnosed problem.  Injection given today.  Concerned that the progression of the arthritic changes of his hip is increasing secondary to compensating for the contralateral side.  Patient is still holding in trying to avoid any type of surgical intervention at the moment.  Discussed with patient about the possibility of this slowly causing progression on the contralateral side.  Patient will continue to increase activity slowly.  Follow-up with me again in 4 to 8 weeks.

## 2020-02-24 ENCOUNTER — Other Ambulatory Visit: Payer: Self-pay | Admitting: Adult Health

## 2020-02-27 ENCOUNTER — Ambulatory Visit (INDEPENDENT_AMBULATORY_CARE_PROVIDER_SITE_OTHER): Payer: Medicare Other | Admitting: Family Medicine

## 2020-02-27 ENCOUNTER — Other Ambulatory Visit: Payer: Self-pay

## 2020-02-27 ENCOUNTER — Encounter: Payer: Self-pay | Admitting: Family Medicine

## 2020-02-27 VITALS — BP 132/84 | HR 66 | Ht 62.0 in | Wt 145.0 lb

## 2020-02-27 DIAGNOSIS — M170 Bilateral primary osteoarthritis of knee: Secondary | ICD-10-CM | POA: Diagnosis not present

## 2020-02-27 DIAGNOSIS — M7062 Trochanteric bursitis, left hip: Secondary | ICD-10-CM

## 2020-02-27 DIAGNOSIS — M1611 Unilateral primary osteoarthritis, right hip: Secondary | ICD-10-CM | POA: Diagnosis not present

## 2020-02-27 MED ORDER — NABUMETONE 500 MG PO TABS: 500.0000 mg | ORAL_TABLET | Freq: Two times a day (BID) | ORAL | 0 refills | Status: DC

## 2020-02-27 NOTE — Progress Notes (Signed)
[place] Phone: [phone] Subjective:    I'm seeing this patient by the request  of:  [name], NP  CC: Bilateral knee pain, hip pain  MNO:[id number]   02/19/2020 Previously undiagnosed problem.  Injection given today.  Concerned that the progression of the arthritic changes of his hip is increasing secondary to compensating for the contralateral side.  Patient is still holding in trying to avoid any type of surgical intervention at the moment.  Discussed with patient about the possibility of this slowly causing progression on the contralateral side.  Patient will continue to increase activity slowly.  Follow-up with me again in 4 to 8 weeks.  Right-sided injection today.  Patient has failed all other conservative therapy for this.  We discussed medication management including meloxicam.  Do feel underlying arthritic changes of the hip could be contributing as well.  Discussed icing regimen and home exercises.  Discussed which activities to do which wants to avoid.  Follow-up again in 4 to 8 weeks  Update 02/27/2020 Cathy [name] is a 74 y.o. female coming in with complaint of bilateral knee pain. Is here for injections in knees and left hip.   Patient feels that the right-sided hip injection was made improvement for approximately 1 week and then worsening symptoms again.  Patient having near the same similar presentation on the left side but states that it is more on the lateral aspect of the groin.  Has responded well to greater trochanteric injections previously.     Past Medical History:  Diagnosis Date  . Chronic constipation   . Depression   . Diabetes mellitus type II   . DJD (degenerative joint disease)    knees  . Female cystocele   . GERD (gastroesophageal reflux disease)   . History of esophagitis   . History of MRSA infection    recurrent carbuncle  . History of recurrent UTIs   .  Hyperlipidemia   . Hypertension   . Urgency of urination    Past Surgical History:  Procedure Laterality Date  . APPENDECTOMY  1980's  . BREAST EXCISIONAL BIOPSY Left 1989  . COLONOSCOPY  last one 06-18-2015  . INGUINAL HERNIA REPAIR Left 05-10-2001  . LAPAROSCOPIC LYSIS OF ADHESIONS  01/17/2019  . OOPHORECTOMY Right 01/17/2019  . PERINEOPLASTY  01/17/2019  . SALPINGECTOMY Left 01/17/2019  . VAGINAL HYSTERECTOMY  1980's  . VAGINAL PROLAPSE REPAIR N/A 03/02/2016   Procedure: COLOPLAST ANTERIOR  VAULT REPAIR WITH AXIS DERMIS. SACROSPINUS FIXATION, AUGMENTATION WITH AXIS DERMIS;  Surgeon: Carolan Clines, MD;  Location: [facility];  Service: Urology;  Laterality: N/A;   Social History   Socioeconomic History  . Marital status: Single    Spouse name: Not on file  . Number of children: Not on file  . Years of education: Not on file  . Highest education level: Not on file  Occupational History  . Not on file  Tobacco Use  . Smoking status: Former Smoker    Years: 1.00    Types: Cigarettes    Quit date: 02/28/1996    Years since quitting: 24.0  . Smokeless tobacco: Never Used  Substance and Sexual Activity  . Alcohol use: Yes    Alcohol/week: 0.0 standard drinks    Comment: occasional  . Drug use: No  . Sexual activity: Not on file  Other Topics Concern  . Not on file  Social History Narrative   Single  Former Smoker  -  quit 10 to 11 years ago (light smoker)   Alcohol use-yes     2 children    Occupation: [employer]    Social Determinants of Health   Financial Resource Strain:   . Difficulty of Paying Living Expenses:   Food Insecurity:   . Worried About Charity fundraiser in the Last Year:   . Arboriculturist in the Last Year:   Transportation Needs:   . Film/video editor (Medical):   [name] Lack of Transportation (Non-Medical):   Physical Activity:   . Days of Exercise per Week:   . Minutes of Exercise per Session:   Stress:    . Feeling of Stress :   Social Connections:   . Frequency of Communication with Friends and Family:   . Frequency of Social Gatherings with Friends and Family:   . Attends Religious Services:   . Active Member of Clubs or Organizations:   . Attends Archivist Meetings:   [name] Marital Status:    Allergies  Allergen Reactions  . Penicillins Hives    Has patient had a PCN reaction causing immediate rash, facial/tongue/throat swelling, SOB or lightheadedness with hypotension: Yes Has patient had a PCN reaction causing severe rash involving mucus membranes or skin necrosis: No Has patient had a PCN reaction that required hospitalization: No Has patient had a PCN reaction occurring within the last 10 years: No If all of the above answers are "NO", then may proceed with Cephalosporin use.    Family History  Problem Relation Age of Onset  . Aneurysm Mother 52       deceased secondary to brain aneurysm  . Liver cancer Father 63       deceased  . Diabetes Other        grandmother  . Colon cancer Neg Hx   . Stomach cancer Neg Hx   . Rectal cancer Neg Hx   . Esophageal cancer Neg Hx   . Breast cancer Neg Hx      Current Outpatient Medications (Cardiovascular):  .  lisinopril (ZESTRIL) 10 MG tablet, TAKE 1 TABLET(10 MG) BY MOUTH DAILY .  simvastatin (ZOCOR) 40 MG tablet, TAKE 1 TABLET BY MOUTH ONCE DAILY AT 6PM   Current Outpatient Medications (Analgesics):  .  aspirin 81 MG tablet, Take 81 mg by mouth daily.   .  meloxicam (MOBIC) 7.5 MG tablet, Take 1 tablet (7.5 mg total) by mouth daily. .  nabumetone (RELAFEN) 500 MG tablet, Take 1 tablet (500 mg total) by mouth 2 (two) times daily.   Current Outpatient Medications (Other):  .  blood glucose meter kit and supplies KIT, Dispense based on patient and insurance preference. Use once daily to test blood glucose. (FOR ICD-E11.9). .  Calcium Carbonate-Vitamin D (CALTRATE 600+D) 600-400 MG-UNIT per tablet, Take 1 tablet by  mouth daily.   [name]  CRANBERRY PO, Take 2 tablets by mouth daily.  [name]  estradiol (ESTRACE) 0.1 MG/GM vaginal cream, U 0.5 GRAM VAGINALLY HS .  Misc Natural Products (TART CHERRY ADVANCED) CAPS, Take 1 capsule by mouth daily. Glory Rosebush DELICA LANCETS FINE MISC, USE TO CHECK BLOOD SUGAR ONCE DAILY .  ONETOUCH VERIO test strip, USE TO TEST BLOOD GLUCOSE TWICE DAILY .  polyethylene glycol (MIRALAX / GLYCOLAX) packet, Take 17 g by mouth daily as needed for mild constipation. .  TURMERIC PO, Take 1 tablet by mouth daily.    Reviewed prior  external information including notes and imaging from  primary care provider As well as notes that were available from care everywhere and other healthcare systems.  Past medical history, social, surgical and family history all reviewed in electronic medical record.  No pertanent information unless stated regarding to the chief complaint.   Review of Systems:  No headache, visual changes, nausea, vomiting, diarrhea, constipation, dizziness, abdominal pain, skin rash, fevers, chills, night sweats, weight loss, swollen lymph nodes, body aches, joint swelling, chest pain, shortness of breath, mood changes. POSITIVE muscle aches  Objective  Blood pressure 132/84, pulse 66, height 5' 2"  (1.575 m), weight 145 lb (65.8 kg), SpO2 99 %.   General: No apparent distress alert and oriented x3 mood and affect normal, dressed appropriately.  HEENT: Pupils equal, extraocular movements intact  Respiratory: Patient's speak in full sentences and does not appear short of breath  Cardiovascular: No lower extremity edema, non tender, no erythema  Neuro: Cranial nerves II through XII are intact, neurovascularly intact in all extremities with 2+ DTRs and 2+ pulses.  Gait severe antalgic  Bilateral hip exam shows a significant decrease in internal range of motion especially right greater than left with only 5 degrees.  Patient has pain more in the groin area.  Tender to palpation over  the left greater trochanteric area.  Knee: Bilateral valgus deformity noted.  Abnormal thigh to calf ratio.  Tender to palpation over medial and PF joint line.  ROM lacks last 5 degrees of extension and flexion. instability with valgus force.  painful patellar compression. Patellar glide with moderate crepitus. Patellar and quadriceps tendons unremarkable. Hamstring and quadriceps strength is normal.  After informed written and verbal consent, patient was seated on exam table. Right knee was prepped with alcohol swab and utilizing anterolateral approach, patient's right knee space was injected with 4:1  marcaine 0.5%: Kenalog 54m/dL. Patient tolerated the procedure well without immediate complications.  After informed written and verbal consent, patient was seated on exam table. Left knee was prepped with alcohol swab and utilizing anterolateral approach, patient's left knee space was injected with 4:1  marcaine 0.5%: Kenalog 451mdL. Patient tolerated the procedure well without immediate complications.  After verbal consent patient was prepped with alcohol swabs and with a 21 gauge 2 inch needle injected with 1 cc of 0.5% Marcaine and 1 cc of Kenalog 40 mg/mL into the left greater trochanteric area.  No blood loss.  Band-Aid placed.    Impression and Recommendations:     This case required medical decision making of moderate complexity. The above documentation has been reviewed and is accurate and complete ZaLyndal PulleyDO       Note: This dictation was prepared with Dragon dictation along with smaller phrase technology. Any transcriptional errors that result from this process are unintentional.

## 2020-02-27 NOTE — Assessment & Plan Note (Signed)
Injected.  Exacerbation of a chronic problem.  Responded well.  Discussed home exercise and icing regimen.  Oral anti-inflammatories.  Follow-up again in 4 to 8 weeks

## 2020-02-27 NOTE — Patient Instructions (Addendum)
Sorry about the hip [facility]-Dr. Latanya Maudlin will call you Injected both knees and left hip Medication at pharmacy Will call once we get gel approval

## 2020-02-27 NOTE — Assessment & Plan Note (Signed)
Bilateral injections given today, tolerated the procedure well, discussed icing regimen and home exercise, patient is going to increase activity slowly.  Follow-up with me again 6 to 8 weeks.  Discussed medication management and sent in the new oral anti-inflammatory.  Patient is a candidate for viscosupplementation we will try again to get approval

## 2020-02-27 NOTE — Assessment & Plan Note (Signed)
Patient made minimal improvement with injection.  Due to the severity of the hip at this point I would like to refer patient to orthopedic surgery to discuss the possibility of surgical and intervention.  Patient is in agreement with the plan.

## 2020-02-27 NOTE — Telephone Encounter (Signed)
Sent to the pharmacy by e-scribe. 

## 2020-02-28 ENCOUNTER — Telehealth: Payer: Self-pay

## 2020-02-28 NOTE — Telephone Encounter (Signed)
Left message for patient to come in for bilateral durolane injections on 4/14 at 7:45am.

## 2020-02-29 ENCOUNTER — Telehealth: Payer: Self-pay | Admitting: Adult Health

## 2020-02-29 NOTE — Telephone Encounter (Signed)
Pt is calling in stating that she is out of her Rx lisinopril and would like to see if it can be called in today.  Pharm:  [facility] on [address]

## 2020-02-29 NOTE — Telephone Encounter (Signed)
Spoke to the pharmacy.  Pt picked up a 90 day supply on 12/28/19.  Pt should not need a refill at this time.  Tried to reach her by phone.  No answer or machine.  Will try again at a later time.

## 2020-03-01 NOTE — Telephone Encounter (Signed)
Left a message on identified voicemail informing the pt of the conversation with pharmacy.  Advised that she should still have medication but if not than call me.  Also stated that if I did not hear from her that I will assume she has her medication and nothing is needed from me.  Again, asked that she call if medication is needed.  Left phone number.  Will close message.

## 2020-03-04 DIAGNOSIS — M160 Bilateral primary osteoarthritis of hip: Secondary | ICD-10-CM | POA: Diagnosis not present

## 2020-03-14 ENCOUNTER — Other Ambulatory Visit: Payer: Self-pay | Admitting: Orthopaedic Surgery

## 2020-03-21 ENCOUNTER — Other Ambulatory Visit: Payer: Self-pay

## 2020-03-22 ENCOUNTER — Other Ambulatory Visit: Payer: Self-pay | Admitting: Adult Health

## 2020-03-22 ENCOUNTER — Encounter: Payer: Self-pay | Admitting: Adult Health

## 2020-03-22 ENCOUNTER — Ambulatory Visit (INDEPENDENT_AMBULATORY_CARE_PROVIDER_SITE_OTHER): Payer: Medicare Other | Admitting: Adult Health

## 2020-03-22 VITALS — BP 108/72 | Temp 97.2°F | Wt 138.0 lb

## 2020-03-22 DIAGNOSIS — Z01818 Encounter for other preprocedural examination: Secondary | ICD-10-CM

## 2020-03-22 DIAGNOSIS — Z1231 Encounter for screening mammogram for malignant neoplasm of breast: Secondary | ICD-10-CM

## 2020-03-22 NOTE — Progress Notes (Signed)
Subjective:    Patient ID: Cathy [name], female    DOB: [birth year], 74 y.o.   MRN: [id number]  HPI  74 year old female who  has a past medical history of Chronic constipation, Depression, Diabetes mellitus type II, DJD (degenerative joint disease), Female cystocele, GERD (gastroesophageal reflux disease), History of esophagitis, History of MRSA infection, History of recurrent UTIs, Hyperlipidemia, Hypertension, and Urgency of urination.  She presents to the clinic today for pre surgical clearance. She will be having a right total hip replacement done by Dr. Rhona Raider on 04/09/2020. She reports that she is having more pain in her right hip. She is looking forward to the surgery and has no questions about the surgical procedure.     Review of Systems  Constitutional: Negative.   HENT: Negative.   Eyes: Negative.   Respiratory: Negative.   Cardiovascular: Negative.   Gastrointestinal: Negative.   Endocrine: Negative.   Genitourinary: Negative.   Musculoskeletal: Positive for arthralgias and gait problem.  Skin: Negative.   Allergic/Immunologic: Negative.   Hematological: Negative.   Psychiatric/Behavioral: Negative.   All other systems reviewed and are negative.  Past Medical History:  Diagnosis Date  . Chronic constipation   . Depression   . Diabetes mellitus type II   . DJD (degenerative joint disease)    knees  . Female cystocele   . GERD (gastroesophageal reflux disease)   . History of esophagitis   . History of MRSA infection    recurrent carbuncle  . History of recurrent UTIs   . Hyperlipidemia   . Hypertension   . Urgency of urination     Social History   Socioeconomic History  . Marital status: Single    Spouse name: Not on file  . Number of children: Not on file  . Years of education: Not on file  . Highest education level: Not on file  Occupational History  . Not on file  Tobacco Use  . Smoking status: Former Smoker    Years: 1.00    Types:  Cigarettes    Quit date: 02/28/1996    Years since quitting: 24.0  . Smokeless tobacco: Never Used  Substance and Sexual Activity  . Alcohol use: Yes    Alcohol/week: 0.0 standard drinks    Comment: occasional  . Drug use: No  . Sexual activity: Not on file  Other Topics Concern  . Not on file  Social History Narrative   Single   Former Smoker  -  quit 10 to 11 years ago (light smoker)   Alcohol use-yes     2 children    Occupation: [employer]    Social Determinants of Health   Financial Resource Strain:   . Difficulty of Paying Living Expenses:   Food Insecurity:   . Worried About Charity fundraiser in the Last Year:   . Arboriculturist in the Last Year:   Transportation Needs:   . Film/video editor (Medical):   [name] Lack of Transportation (Non-Medical):   Physical Activity:   . Days of Exercise per Week:   . Minutes of Exercise per Session:   Stress:   . Feeling of Stress :   Social Connections:   . Frequency of Communication with Friends and Family:   . Frequency of Social Gatherings with Friends and Family:   . Attends Religious Services:   . Active Member of Clubs or Organizations:   . Attends Archivist Meetings:   .  Marital Status:   Intimate Partner Violence:   . Fear of Current or Ex-Partner:   . Emotionally Abused:   [name] Physically Abused:   . Sexually Abused:     Past Surgical History:  Procedure Laterality Date  . APPENDECTOMY  1980's  . BREAST EXCISIONAL BIOPSY Left 1989  . COLONOSCOPY  last one 06-18-2015  . INGUINAL HERNIA REPAIR Left 05-10-2001  . LAPAROSCOPIC LYSIS OF ADHESIONS  01/17/2019  . OOPHORECTOMY Right 01/17/2019  . PERINEOPLASTY  01/17/2019  . SALPINGECTOMY Left 01/17/2019  . VAGINAL HYSTERECTOMY  1980's  . VAGINAL PROLAPSE REPAIR N/A 03/02/2016   Procedure: COLOPLAST ANTERIOR  VAULT REPAIR WITH AXIS DERMIS. SACROSPINUS FIXATION, AUGMENTATION WITH AXIS DERMIS;  Surgeon: Carolan Clines, MD;  Location:  [facility];  Service: Urology;  Laterality: N/A;    Family History  Problem Relation Age of Onset  . Aneurysm Mother 52       deceased secondary to brain aneurysm  . Liver cancer Father 75       deceased  . Diabetes Other        grandmother  . Colon cancer Neg Hx   . Stomach cancer Neg Hx   . Rectal cancer Neg Hx   . Esophageal cancer Neg Hx   . Breast cancer Neg Hx     Allergies  Allergen Reactions  . Penicillins Hives    Has patient had a PCN reaction causing immediate rash, facial/tongue/throat swelling, SOB or lightheadedness with hypotension: Yes Has patient had a PCN reaction causing severe rash involving mucus membranes or skin necrosis: No Has patient had a PCN reaction that required hospitalization: No Has patient had a PCN reaction occurring within the last 10 years: No If all of the above answers are "NO", then may proceed with Cephalosporin use.     Current Outpatient Medications on File Prior to Visit  Medication Sig Dispense Refill  . Ascorbic Acid (VITAMIN C) 1000 MG tablet Take 1,000 mg by mouth daily.    [name] aspirin 81 MG tablet Take 81 mg by mouth daily.      [name] b complex vitamins capsule Take 1 capsule by mouth daily.    . Biotin 1000 MCG tablet Take 1,000 mcg by mouth daily.    . blood glucose meter kit and supplies KIT Dispense based on patient and insurance preference. Use once daily to test blood glucose. (FOR ICD-E11.9). 1 each 0  . Calcium Carbonate-Vitamin D (CALTRATE 600+D) 600-400 MG-UNIT per tablet Take 1 tablet by mouth daily.      . Cinnamon 500 MG capsule Take 1,000 mg by mouth daily.    [name] CRANBERRY PO Take 2 tablets by mouth daily. 4200 mg    . Cyanocobalamin (VITAMIN B-12) 2500 MCG SUBL Place 2,500 mcg under the tongue daily.    [name] estradiol (ESTRACE) 0.1 MG/GM vaginal cream Place 1 Applicatorful vaginally every other day. In the evening  3  . fexofenadine (ALLEGRA) 180 MG tablet Take 180 mg by mouth daily.    . fluticasone  (FLONASE) 50 MCG/ACT nasal spray Place 2 sprays into both nostrils daily.    [name] lisinopril (ZESTRIL) 10 MG tablet TAKE 1 TABLET(10 MG) BY MOUTH DAILY (Patient taking differently: Take 10 mg by mouth daily. ) 90 tablet 0  . meloxicam (MOBIC) 7.5 MG tablet TAKE 1 TABLET(7.5 MG) BY MOUTH DAILY (Patient taking differently: Take 7.5 mg by mouth daily as needed for pain. ) 30 tablet 3  . Misc Natural Products (TART CHERRY ADVANCED)  CAPS Take 2 capsules by mouth daily.     . Multiple Vitamins-Minerals (MULTIVITAMIN WITH MINERALS) tablet Take 1 tablet by mouth daily. Centrum silver    . nabumetone (RELAFEN) 500 MG tablet Take 1 tablet (500 mg total) by mouth 2 (two) times daily. (Patient taking differently: Take 500 mg by mouth daily as needed for mild pain. ) 60 tablet 0  . ONETOUCH DELICA LANCETS FINE MISC USE TO CHECK BLOOD SUGAR ONCE DAILY 100 each 3  . ONETOUCH VERIO test strip USE TO TEST BLOOD GLUCOSE TWICE DAILY 200 strip 3  . OVER THE COUNTER MEDICATION Take 1 tablet by mouth daily as needed (constipation). Vital Lax    . polyethylene glycol (MIRALAX / GLYCOLAX) packet Take 17 g by mouth daily as needed for mild constipation.    . simvastatin (ZOCOR) 40 MG tablet TAKE 1 TABLET BY MOUTH ONCE DAILY AT 6PM (Patient taking differently: Take 40 mg by mouth daily at 6 PM. ) 90 tablet 0  . trimethoprim (TRIMPEX) 100 MG tablet Take 100 mg by mouth as needed.    . Turmeric 500 MG CAPS Take 1,000 mg by mouth daily.     . [DISCONTINUED] valsartan (DIOVAN) 160 MG tablet Take 1/2 tablet by mouth once daily 30 tablet 5   No current facility-administered medications on file prior to visit.    BP 108/72   Temp (!) 97.2 F (36.2 C)   Wt 138 lb (62.6 kg)   BMI 25.24 kg/m       Objective:   Physical Exam Vitals and nursing note reviewed.  Constitutional:      General: She is not in acute distress.    Appearance: Normal appearance. She is well-developed. She is not ill-appearing.  HENT:     Head:  Normocephalic and atraumatic.     Right Ear: Tympanic membrane, ear canal and external ear normal. There is no impacted cerumen.     Left Ear: Tympanic membrane, ear canal and external ear normal. There is no impacted cerumen.     Nose: Nose normal. No congestion or rhinorrhea.     Mouth/Throat:     Mouth: Mucous membranes are moist.     Pharynx: Oropharynx is clear. No oropharyngeal exudate or posterior oropharyngeal erythema.  Eyes:     General:        Right eye: No discharge.        Left eye: No discharge.     Extraocular Movements: Extraocular movements intact.     Conjunctiva/sclera: Conjunctivae normal.     Pupils: Pupils are equal, round, and reactive to light.  Neck:     Thyroid: No thyromegaly.     Vascular: No carotid bruit.     Trachea: No tracheal deviation.  Cardiovascular:     Rate and Rhythm: Normal rate and regular rhythm.     Pulses: Normal pulses.     Heart sounds: Normal heart sounds. No murmur. No friction rub. No gallop.   Pulmonary:     Effort: Pulmonary effort is normal. No respiratory distress.     Breath sounds: Normal breath sounds. No stridor. No wheezing, rhonchi or rales.  Chest:     Chest wall: No tenderness.  Abdominal:     General: Abdomen is flat. Bowel sounds are normal. There is no distension.     Palpations: Abdomen is soft. There is no mass.     Tenderness: There is no abdominal tenderness. There is no right CVA tenderness, left CVA tenderness, guarding or rebound.  Hernia: No hernia is present.  Musculoskeletal:        General: No swelling, tenderness, deformity or signs of injury. Normal range of motion.     Cervical back: Normal range of motion and neck supple.     Right lower leg: No edema.     Left lower leg: No edema.  Lymphadenopathy:     Cervical: No cervical adenopathy.  Skin:    General: Skin is warm and dry.     Coloration: Skin is not jaundiced or pale.     Findings: No bruising, erythema, lesion or rash.  Neurological:      General: No focal deficit present.     Mental Status: She is alert and oriented to person, place, and time.     Cranial Nerves: No cranial nerve deficit.     Sensory: No sensory deficit.     Motor: No weakness.     Coordination: Coordination normal.     Gait: Gait abnormal (limping gait ).     Deep Tendon Reflexes: Reflexes normal.  Psychiatric:        Mood and Affect: Mood normal.        Behavior: Behavior normal.        Thought Content: Thought content normal.        Judgment: Judgment normal.        Assessment & Plan:  1. Pre-operative clearance - Patient will be cleared for surgery. Labs and chest xray are done prior to surgery through [facility].  - EKG 12-Lead- Sinus  Rhythm  -Left atrial enlargement. Rate 78   [name], NP

## 2020-03-24 ENCOUNTER — Other Ambulatory Visit: Payer: Self-pay | Admitting: Family Medicine

## 2020-04-01 ENCOUNTER — Encounter (HOSPITAL_COMMUNITY): Payer: Self-pay

## 2020-04-01 NOTE — H&P (Signed)
TOTAL HIP ADMISSION H&P  Patient is admitted for right total hip arthroplasty.  Subjective:  Chief Complaint: right hip pain  HPI: Cathy [name], 74 y.o. female, has a history of pain and functional disability in the right hip(s) due to arthritis and patient has failed non-surgical conservative treatments for greater than 12 weeks to include NSAID's and/or analgesics, flexibility and strengthening excercises, use of assistive devices, weight reduction as appropriate and activity modification.  Onset of symptoms was gradual starting 5 years ago with gradually worsening course since that time.The patient noted no past surgery on the right hip(s).  Patient currently rates pain in the right hip at 10 out of 10 with activity. Patient has night pain, worsening of pain with activity and weight bearing, trendelenberg gait, pain that interfers with activities of daily living and crepitus. Patient has evidence of subchondral cysts, subchondral sclerosis, periarticular osteophytes and joint space narrowing by imaging studies. This condition presents safety issues increasing the risk of falls. There is no current active infection.  Patient Active Problem List   Diagnosis Date Noted  . Arthritis of right hip 02/19/2020  . Greater trochanteric bursitis of both hips 12/19/2019  . Greater trochanteric bursitis of left hip 10/03/2019  . Arthritis of left hip 10/03/2019  . Left groin pain 03/01/2019  . Right shoulder pain 12/27/2018  . Chronic bilateral low back pain with right-sided sciatica 01/14/2018  . Urinary frequency 01/14/2018  . Degenerative arthritis of knee, bilateral 10/21/2017  . Arthritis of knee, left 08/03/2017  . GERD (gastroesophageal reflux disease) 03/27/2015  . Preventative health care 01/04/2015  . Constipation 07/15/2012  . Allergic rhinitis 04/01/2012  . Diabetes mellitus type 2, controlled ([place]) 10/14/2007  . CARPAL TUNNEL SYNDROME 10/14/2007  . Dyslipidemia 10/13/2007  .  Essential hypertension 10/13/2007  . Depression, recurrent ([place]) 10/13/2007   Past Medical History:  Diagnosis Date  . Chronic constipation   . Depression   . Diabetes mellitus type II   . DJD (degenerative joint disease)    knees  . Female cystocele   . GERD (gastroesophageal reflux disease)   . History of esophagitis   . History of MRSA infection    recurrent carbuncle  . History of recurrent UTIs   . Hyperlipidemia   . Hypertension   . Urgency of urination     Past Surgical History:  Procedure Laterality Date  . APPENDECTOMY  1980's  . BREAST EXCISIONAL BIOPSY Left 1989  . COLONOSCOPY  last one 06-18-2015  . INGUINAL HERNIA REPAIR Left 05-10-2001  . LAPAROSCOPIC LYSIS OF ADHESIONS  01/17/2019  . OOPHORECTOMY Right 01/17/2019  . PERINEOPLASTY  01/17/2019  . SALPINGECTOMY Left 01/17/2019  . VAGINAL HYSTERECTOMY  1980's  . VAGINAL PROLAPSE REPAIR N/A 03/02/2016   Procedure: COLOPLAST ANTERIOR  VAULT REPAIR WITH AXIS DERMIS. SACROSPINUS FIXATION, AUGMENTATION WITH AXIS DERMIS;  Surgeon: Carolan Clines, MD;  Location: [facility];  Service: Urology;  Laterality: N/A;    No current facility-administered medications for this encounter.   Current Outpatient Medications  Medication Sig Dispense Refill Last Dose  . Ascorbic Acid (VITAMIN C) 1000 MG tablet Take 1,000 mg by mouth daily.     [name] aspirin 81 MG tablet Take 81 mg by mouth daily.       [name] b complex vitamins capsule Take 1 capsule by mouth daily.     . Biotin 1000 MCG tablet Take 1,000 mcg by mouth daily.     . Calcium Carbonate-Vitamin D (CALTRATE 600+D) 600-400 MG-UNIT per  tablet Take 1 tablet by mouth daily.       . Cinnamon 500 MG capsule Take 1,000 mg by mouth daily.     [name] CRANBERRY PO Take 2 tablets by mouth daily. 4200 mg     . Cyanocobalamin (VITAMIN B-12) 2500 MCG SUBL Place 2,500 mcg under the tongue daily.     [name] estradiol (ESTRACE) 0.1 MG/GM vaginal cream Place 1 Applicatorful vaginally every  other day. In the evening  3   . fexofenadine (ALLEGRA) 180 MG tablet Take 180 mg by mouth daily.     . fluticasone (FLONASE) 50 MCG/ACT nasal spray Place 2 sprays into both nostrils daily.     [name] lisinopril (ZESTRIL) 10 MG tablet TAKE 1 TABLET(10 MG) BY MOUTH DAILY (Patient taking differently: Take 10 mg by mouth daily. ) 90 tablet 0   . meloxicam (MOBIC) 7.5 MG tablet TAKE 1 TABLET(7.5 MG) BY MOUTH DAILY (Patient taking differently: Take 7.5 mg by mouth daily as needed for pain. ) 30 tablet 3   . Misc Natural Products (TART CHERRY ADVANCED) CAPS Take 2 capsules by mouth daily.      . Multiple Vitamins-Minerals (MULTIVITAMIN WITH MINERALS) tablet Take 1 tablet by mouth daily. Centrum silver     . nabumetone (RELAFEN) 500 MG tablet Take 1 tablet (500 mg total) by mouth 2 (two) times daily. (Patient taking differently: Take 500 mg by mouth daily as needed for mild pain. ) 60 tablet 0   . OVER THE COUNTER MEDICATION Take 1 tablet by mouth daily as needed (constipation). Vital Lax     . polyethylene glycol (MIRALAX / GLYCOLAX) packet Take 17 g by mouth daily as needed for mild constipation.     . simvastatin (ZOCOR) 40 MG tablet TAKE 1 TABLET BY MOUTH ONCE DAILY AT 6PM (Patient taking differently: Take 40 mg by mouth daily at 6 PM. ) 90 tablet 0   . trimethoprim (TRIMPEX) 100 MG tablet Take 100 mg by mouth as needed.     . Turmeric 500 MG CAPS Take 1,000 mg by mouth daily.      . blood glucose meter kit and supplies KIT Dispense based on patient and insurance preference. Use once daily to test blood glucose. (FOR ICD-E11.9). 1 each 0   . ONETOUCH DELICA LANCETS FINE MISC USE TO CHECK BLOOD SUGAR ONCE DAILY 100 each 3   . ONETOUCH VERIO test strip USE TO TEST BLOOD GLUCOSE TWICE DAILY 200 strip 3    Allergies  Allergen Reactions  . Penicillins Hives    Has patient had a PCN reaction causing immediate rash, facial/tongue/throat swelling, SOB or lightheadedness with hypotension: Yes Has patient had a  PCN reaction causing severe rash involving mucus membranes or skin necrosis: No Has patient had a PCN reaction that required hospitalization: No Has patient had a PCN reaction occurring within the last 10 years: No If all of the above answers are "NO", then may proceed with Cephalosporin use.     Social History   Tobacco Use  . Smoking status: Former Smoker    Years: 1.00    Types: Cigarettes    Quit date: 02/28/1996    Years since quitting: 24.1  . Smokeless tobacco: Never Used  Substance Use Topics  . Alcohol use: Yes    Alcohol/week: 0.0 standard drinks    Comment: occasional    Family History  Problem Relation Age of Onset  . Aneurysm Mother 33       deceased secondary to brain  aneurysm  . Liver cancer Father 50       deceased  . Diabetes Other        grandmother  . Colon cancer Neg Hx   . Stomach cancer Neg Hx   . Rectal cancer Neg Hx   . Esophageal cancer Neg Hx   . Breast cancer Neg Hx      Review of Systems  Musculoskeletal: Positive for arthralgias.       Right hip  All other systems reviewed and are negative.   Objective:  Physical Exam  Constitutional: She is oriented to person, place, and time. She appears well-developed and well-nourished.  HENT:  Head: Normocephalic and atraumatic.  Eyes: Pupils are equal, round, and reactive to light.  Cardiovascular: Normal rate and regular rhythm.  Respiratory: Effort normal.  GI: Soft.  Musculoskeletal:     Cervical back: Normal range of motion.     Comments: Right hip motion is limited and extremely painful in internal rotation.  Her leg lengths look about equal.  She walks with a markedly altered gait.  Neurological: She is alert and oriented to person, place, and time.  Skin: Skin is warm and dry.  Psychiatric: She has a normal mood and affect. Her behavior is normal. Judgment and thought content normal.    Vital signs in last 24 hours:    Labs:   Estimated body mass index is 25.24 kg/m as  calculated from the following:   Height as of 02/27/20: 5' 2"  (1.575 m).   Weight as of 03/22/20: 62.6 kg.   Imaging Review Plain radiographs demonstrate severe degenerative joint disease of the right hip(s). The bone quality appears to be good for age and reported activity level.      Assessment/Plan:  End stage primary arthritis, right hip(s)  The patient history, physical examination, clinical judgement of the provider and imaging studies are consistent with end stage degenerative joint disease of the right hip(s) and total hip arthroplasty is deemed medically necessary. The treatment options including medical management, injection therapy, arthroscopy and arthroplasty were discussed at length. The risks and benefits of total hip arthroplasty were presented and reviewed. The risks due to aseptic loosening, infection, stiffness, dislocation/subluxation,  thromboembolic complications and other imponderables were discussed.  The patient acknowledged the explanation, agreed to proceed with the plan and consent was signed. Patient is being admitted for inpatient treatment for surgery, pain control, PT, OT, prophylactic antibiotics, VTE prophylaxis, progressive ambulation and ADL's and discharge planning.The patient is planning to be discharged home with home health services

## 2020-04-01 NOTE — Patient Instructions (Addendum)
DUE TO COVID-19 ONLY ONE VISITOR ARE ALLOWED TO COME WITH YOU AND STAY IN THE WAITING ROOM ONLY DURING PRE OP AND PROCEDURE. THEN TWO VISITORS MAY VISIT WITH YOU IN YOUR PRIVATE ROOM DURING VISITING HOURS ONLY!!   COVID SWAB TESTING MUST BE COMPLETED ON:  Friday, Apr 05, 2020 at   2:15PM [address] [facility] (Must self quarantine after testing. Follow instructions on handout.)             Your procedure is scheduled on: Tuesday, Apr 09, 2020   Report to [facility] Main  Entrance    Report to admitting at 7:45 AM   Call this number if you have problems the morning of surgery [phone]   Do not eat food  :After Midnight.   May have liquids until 7:15 AM day of surgery   CLEAR LIQUID DIET  Foods Allowed                                                                     Foods Excluded  Water, Black Coffee and tea, regular and decaf                             liquids that you cannot  Plain Jell-O in any flavor  (No red)                                           see through such as: Fruit ices (not with fruit pulp)                                     milk, soups, orange juice  Iced Popsicles (No red)                                    All solid food Carbonated beverages, regular and diet                                    Apple juices Sports drinks like Gatorade (No red) Lightly seasoned clear broth or consume(fat free) Sugar, honey syrup  Sample Menu Breakfast                                Lunch                                     Supper Cranberry juice                    Beef broth                            Chicken broth  Jell-O                                     Grape juice                           Apple juice Coffee or tea                        Jell-O                                      Popsicle                                                Coffee or tea                        Coffee or  tea   Complete one G2 drink the morning of surgery at 7:15 AM the day of surgery.   Oral Hygiene is also important to reduce your risk of infection.                                    Remember - BRUSH YOUR TEETH THE MORNING OF SURGERY WITH YOUR REGULAR TOOTHPASTE   Do NOT smoke after Midnight   Take these medicines the morning of surgery with A SIP OF WATER: Fexofenadine   May use Flonase day of surgery  DO NOT TAKE ANY ORAL DIABETIC MEDICATIONS DAY OF YOUR SURGERY                               You may not have any metal on your body including hair pins, jewelry, and body piercings             Do not wear make-up, lotions, powders, perfumes/cologne, or deodorant             Do not wear nail polish.  Do not shave  48 hours prior to surgery.             Do not bring valuables to the hospital. Cathy [name].   Contacts, dentures or bridgework may not be worn into surgery.   Bring small overnight bag day of surgery.    Patients discharged the day of surgery will not be allowed to drive home.   Special Instructions: Bring a copy of your healthcare power of attorney and living will documents         the day of surgery if you haven't scanned them in before.              Please read over the following fact sheets you were given: IF YOU HAVE QUESTIONS ABOUT YOUR PRE OP INSTRUCTIONS PLEASE CALL [phone]   [facility] - Preparing for Surgery Before surgery, you can play an important role.  Because skin is not sterile, your skin needs to be as free  of germs as possible.  You can reduce the number of germs on your skin by washing with CHG (chlorahexidine gluconate) soap before surgery.  CHG is an antiseptic cleaner which kills germs and bonds with the skin to continue killing germs even after washing. Please DO NOT use if you have an allergy to CHG or antibacterial soaps.  If your skin becomes reddened/irritated stop using the CHG and inform your  nurse when you arrive at Short Stay. Do not shave (including legs and underarms) for at least 48 hours prior to the first CHG shower.  You may shave your face/neck.  Please follow these instructions carefully:  1.  Shower with CHG Soap the night before surgery and the  morning of surgery.  2.  If you choose to wash your hair, wash your hair first as usual with your normal  shampoo.  3.  After you shampoo, rinse your hair and body thoroughly to remove the shampoo.                             4.  Use CHG as you would any other liquid soap.  You can apply chg directly to the skin and wash.  Gently with a scrungie or clean washcloth.  5.  Apply the CHG Soap to your body ONLY FROM THE NECK DOWN.   Do   not use on face/ open                           Wound or open sores. Avoid contact with eyes, ears mouth and   genitals (private parts).                       Wash face,  Genitals (private parts) with your normal soap.             6.  Wash thoroughly, paying special attention to the area where your    surgery  will be performed.  7.  Thoroughly rinse your body with warm water from the neck down.  8.  DO NOT shower/wash with your normal soap after using and rinsing off the CHG Soap.                9.  Pat yourself dry with a clean towel.            10.  Wear clean pajamas.            11.  Place clean sheets on your bed the night of your first shower and do not  sleep with pets. Day of Surgery : Do not apply any lotions/deodorants the morning of surgery.  Please wear clean clothes to the hospital/surgery center.  FAILURE TO FOLLOW THESE INSTRUCTIONS MAY RESULT IN THE CANCELLATION OF YOUR SURGERY  PATIENT SIGNATURE_________________________________  NURSE SIGNATURE__________________________________  ________________________________________________________________________   Cathy [name]  An incentive spirometer is a tool that can help keep your lungs clear and active. This tool measures  how well you are filling your lungs with each breath. Taking long deep breaths may help reverse or decrease the chance of developing breathing (pulmonary) problems (especially infection) following:  A long period of time when you are unable to move or be active. BEFORE THE PROCEDURE   If the spirometer includes an indicator to show your best effort, your nurse or respiratory therapist will set it to a desired goal.  If possible,  sit up straight or lean slightly forward. Try not to slouch.  Hold the incentive spirometer in an upright position. INSTRUCTIONS FOR USE  1. Sit on the edge of your bed if possible, or sit up as far as you can in bed or on a chair. 2. Hold the incentive spirometer in an upright position. 3. Breathe out normally. 4. Place the mouthpiece in your mouth and seal your lips tightly around it. 5. Breathe in slowly and as deeply as possible, raising the piston or the ball toward the top of the column. 6. Hold your breath for 3-5 seconds or for as long as possible. Allow the piston or ball to fall to the bottom of the column. 7. Remove the mouthpiece from your mouth and breathe out normally. 8. Rest for a few seconds and repeat Steps 1 through 7 at least 10 times every 1-2 hours when you are awake. Take your time and take a few normal breaths between deep breaths. 9. The spirometer may include an indicator to show your best effort. Use the indicator as a goal to work toward during each repetition. 10. After each set of 10 deep breaths, practice coughing to be sure your lungs are clear. If you have an incision (the cut made at the time of surgery), support your incision when coughing by placing a pillow or rolled up towels firmly against it. Once you are able to get out of bed, walk around indoors and cough well. You may stop using the incentive spirometer when instructed by your caregiver.  RISKS AND COMPLICATIONS  Take your time so you do not get dizzy or light-headed.  If  you are in pain, you may need to take or ask for pain medication before doing incentive spirometry. It is harder to take a deep breath if you are having pain. AFTER USE  Rest and breathe slowly and easily.  It can be helpful to keep track of a log of your progress. Your caregiver can provide you with a simple table to help with this. If you are using the spirometer at home, follow these instructions: [place] IF:   You are having difficultly using the spirometer.  You have trouble using the spirometer as often as instructed.  Your pain medication is not giving enough relief while using the spirometer.  You develop fever of 100.5 F (38.1 C) or higher. SEEK IMMEDIATE MEDICAL CARE IF:   You cough up bloody sputum that had not been present before.  You develop fever of 102 F (38.9 C) or greater.  You develop worsening pain at or near the incision site. MAKE SURE YOU:   Understand these instructions.  Will watch your condition.  Will get help right away if you are not doing well or get worse. Document Released: 03/22/2007 Document Revised: 02/01/2012 Document Reviewed: 05/23/2007 ExitCare Patient Information 2014 ExitCare, [place].   ________________________________________________________________________  WHAT IS A BLOOD TRANSFUSION? Blood Transfusion Information  A transfusion is the replacement of blood or some of its parts. Blood is made up of multiple cells which provide different functions.  Red blood cells carry oxygen and are used for blood loss replacement.  White blood cells fight against infection.  Platelets control bleeding.  Plasma helps clot blood.  Other blood products are available for specialized needs, such as hemophilia or other clotting disorders. BEFORE THE TRANSFUSION  Who gives blood for transfusions?   Healthy volunteers who are fully evaluated to make sure their blood is safe. This is  blood bank blood. Transfusion therapy is the  safest it has ever been in the practice of medicine. Before blood is taken from a donor, a complete history is taken to make sure that person has no history of diseases nor engages in risky social behavior (examples are intravenous drug use or sexual activity with multiple partners). The donor's travel history is screened to minimize risk of transmitting infections, such as malaria. The donated blood is tested for signs of infectious diseases, such as HIV and hepatitis. The blood is then tested to be sure it is compatible with you in order to minimize the chance of a transfusion reaction. If you or a relative donates blood, this is often done in anticipation of surgery and is not appropriate for emergency situations. It takes many days to process the donated blood. RISKS AND COMPLICATIONS Although transfusion therapy is very safe and saves many lives, the main dangers of transfusion include:   Getting an infectious disease.  Developing a transfusion reaction. This is an allergic reaction to something in the blood you were given. Every precaution is taken to prevent this. The decision to have a blood transfusion has been considered carefully by your caregiver before blood is given. Blood is not given unless the benefits outweigh the risks. AFTER THE TRANSFUSION  Right after receiving a blood transfusion, you will usually feel much better and more energetic. This is especially true if your red blood cells have gotten low (anemic). The transfusion raises the level of the red blood cells which carry oxygen, and this usually causes an energy increase.  The nurse administering the transfusion will monitor you carefully for complications. HOME CARE INSTRUCTIONS  No special instructions are needed after a transfusion. You may find your energy is better. Speak with your caregiver about any limitations on activity for underlying diseases you may have. SEEK MEDICAL CARE IF:   Your condition is not improving  after your transfusion.  You develop redness or irritation at the intravenous (IV) site. SEEK IMMEDIATE MEDICAL CARE IF:  Any of the following symptoms occur over the next 12 hours:  Shaking chills.  You have a temperature by mouth above 102 F (38.9 C), not controlled by medicine.  Chest, back, or muscle pain.  People around you feel you are not acting correctly or are confused.  Shortness of breath or difficulty breathing.  Dizziness and fainting.  You get a rash or develop hives.  You have a decrease in urine output.  Your urine turns a dark color or changes to pink, red, or brown. Any of the following symptoms occur over the next 10 days:  You have a temperature by mouth above 102 F (38.9 C), not controlled by medicine.  Shortness of breath.  Weakness after normal activity.  The white part of the eye turns yellow (jaundice).  You have a decrease in the amount of urine or are urinating less often.  Your urine turns a dark color or changes to pink, red, or brown. Document Released: 11/06/2000 Document Revised: 02/01/2012 Document Reviewed: 06/25/2008 ExitCare Patient Information 2014 ExitCare, [place].  _______________________________________________________________________  How to Manage Your Diabetes Before and After Surgery  Why is it important to control my blood sugar before and after surgery? . Improving blood sugar levels before and after surgery helps healing and can limit problems. . A way of improving blood sugar control is eating a healthy diet by: o  Eating less sugar and carbohydrates o  Increasing activity/exercise o  Talking with your  doctor about reaching your blood sugar goals . High blood sugars (greater than 180 mg/dL) can raise your risk of infections and slow your recovery, so you will need to focus on controlling your diabetes during the weeks before surgery. . Make sure that the doctor who takes care of your diabetes knows about your  planned surgery including the date and location.  How do I manage my blood sugar before surgery? . Check your blood sugar at least 4 times a day, starting 2 days before surgery, to make sure that the level is not too high or low. o Check your blood sugar the morning of your surgery when you wake up and every 2 hours until you get to the Short Stay unit. . If your blood sugar is less than 70 mg/dL, you will need to treat for low blood sugar: o Do not take insulin. o Treat a low blood sugar (less than 70 mg/dL) with  cup of clear juice (cranberry or apple), 4 glucose tablets, OR glucose gel. o Recheck blood sugar in 15 minutes after treatment (to make sure it is greater than 70 mg/dL). If your blood sugar is not greater than 70 mg/dL on recheck, call [phone] for further instructions. . Report your blood sugar to the short stay nurse when you get to Short Stay.  . If you are admitted to the hospital after surgery: o Your blood sugar will be checked by the staff and you will probably be given insulin after surgery (instead of oral diabetes medicines) to make sure you have good blood sugar levels. o The goal for blood sugar control after surgery is 80-180 mg/dL.   WHAT DO I DO ABOUT MY DIABETES MEDICATION?  [name] Do not take oral diabetes medicines (pills) the morning of surgery.   Reviewed and Endorsed by [facility] Patient Education Committee, August 2015

## 2020-04-02 ENCOUNTER — Other Ambulatory Visit: Payer: Self-pay

## 2020-04-02 ENCOUNTER — Encounter (HOSPITAL_COMMUNITY)
Admission: RE | Admit: 2020-04-02 | Discharge: 2020-04-02 | Disposition: A | Discharge status: Home / Self Care | Payer: Medicare Other | Source: Ambulatory Visit | Attending: Orthopaedic Surgery | Admitting: Orthopaedic Surgery

## 2020-04-02 ENCOUNTER — Ambulatory Visit (HOSPITAL_COMMUNITY)
Admission: RE | Admit: 2020-04-02 | Discharge: 2020-04-02 | Disposition: A | Discharge status: Home / Self Care | Payer: Medicare Other | Source: Ambulatory Visit | Attending: Orthopaedic Surgery | Admitting: Orthopaedic Surgery

## 2020-04-02 ENCOUNTER — Encounter (HOSPITAL_COMMUNITY): Payer: Self-pay

## 2020-04-02 DIAGNOSIS — Z01818 Encounter for other preprocedural examination: Secondary | ICD-10-CM | POA: Insufficient documentation

## 2020-04-02 HISTORY — DX: Personal history of other diseases of the digestive system: Z87.19

## 2020-04-02 LAB — CBC WITH DIFFERENTIAL/PLATELET
Abs Immature Granulocytes: 0.03 10*3/uL (ref 0.00–0.07)
Basophils Absolute: 0.1 10*3/uL (ref 0.0–0.1)
Basophils Relative: 1 %
Eosinophils Absolute: 0 10*3/uL (ref 0.0–0.5)
Eosinophils Relative: 0 %
HCT: 43.2 % (ref 36.0–46.0)
Hemoglobin: 13.5 g/dL (ref 12.0–15.0)
Immature Granulocytes: 0 %
Lymphocytes Relative: 22 %
Lymphs Abs: 2.3 10*3/uL (ref 0.7–4.0)
MCH: 25.5 pg — ABNORMAL LOW (ref 26.0–34.0)
MCHC: 31.3 g/dL (ref 30.0–36.0)
MCV: 81.7 fL (ref 80.0–100.0)
Monocytes Absolute: 0.6 10*3/uL (ref 0.1–1.0)
Monocytes Relative: 6 %
Neutro Abs: 7.2 10*3/uL (ref 1.7–7.7)
Neutrophils Relative %: 71 %
Platelets: 333 10*3/uL (ref 150–400)
RBC: 5.29 MIL/uL — ABNORMAL HIGH (ref 3.87–5.11)
RDW: 15.9 % — ABNORMAL HIGH (ref 11.5–15.5)
WBC: 10.3 10*3/uL (ref 4.0–10.5)
nRBC: 0 % (ref 0.0–0.2)

## 2020-04-02 LAB — URINALYSIS, ROUTINE W REFLEX MICROSCOPIC
Bilirubin Urine: NEGATIVE
Glucose, UA: NEGATIVE mg/dL
Hgb urine dipstick: NEGATIVE
Ketones, ur: NEGATIVE mg/dL
Leukocytes,Ua: NEGATIVE
Nitrite: NEGATIVE
Protein, ur: NEGATIVE mg/dL
Specific Gravity, Urine: 1.008 (ref 1.005–1.030)
pH: 7 (ref 5.0–8.0)

## 2020-04-02 LAB — HEMOGLOBIN A1C
Hgb A1c MFr Bld: 6.6 % — ABNORMAL HIGH (ref 4.8–5.6)
Mean Plasma Glucose: 142.72 mg/dL

## 2020-04-02 LAB — BASIC METABOLIC PANEL
Anion gap: 9 (ref 5–15)
BUN: 14 mg/dL (ref 8–23)
CO2: 27 mmol/L (ref 22–32)
Calcium: 9.5 mg/dL (ref 8.9–10.3)
Chloride: 102 mmol/L (ref 98–111)
Creatinine, Ser: 0.78 mg/dL (ref 0.44–1.00)
GFR calc Af Amer: >60 mL/min (ref >60)
GFR calc non Af Amer: >60 mL/min (ref >60)
Glucose, Bld: 106 mg/dL — ABNORMAL HIGH (ref 70–99)
Potassium: 4.5 mmol/L (ref 3.5–5.1)
Sodium: 138 mmol/L (ref 135–145)

## 2020-04-02 LAB — PROTIME-INR
INR: 1 (ref 0.8–1.2)
Prothrombin Time: 12.5 seconds (ref 11.4–15.2)

## 2020-04-02 LAB — SURGICAL PCR SCREEN
MRSA, PCR: NEGATIVE
Staphylococcus aureus: NEGATIVE

## 2020-04-02 LAB — ABO/RH: ABO/RH(D): O POS

## 2020-04-02 LAB — APTT: aPTT: 30 seconds (ref 24–36)

## 2020-04-02 NOTE — Progress Notes (Signed)
Has completed COVID 19 vaccine series  PCP - C. Nafziger Np 03/22/20 clearance in epic Cardiologist - N/A  Chest x-ray - 04/02/20 in epic EKG - 03/22/20 in epic Stress Test - greater than 2 years ECHO - N/A Cardiac Cath - N/A  Sleep Study - N/A CPAP - N/A  Fasting Blood Sugar - 118/125 Checks Blood Sugar __1___ times a day  Blood Thinner Instructions:  N/A Aspirin Instructions: yes Last Dose: patient stated she was told to keep taking  Anesthesia review: N/A  Patient denies shortness of breath, fever, cough and chest pain at PAT appointment   Patient verbalized understanding of instructions that were given to them at the PAT appointment. Patient was also instructed that they will need to review over the PAT instructions again at home before surgery.

## 2020-04-05 ENCOUNTER — Other Ambulatory Visit (HOSPITAL_COMMUNITY)
Admission: RE | Admit: 2020-04-05 | Discharge: 2020-04-05 | Disposition: A | Discharge status: Home / Self Care | Payer: Medicare Other | Source: Ambulatory Visit | Attending: Orthopaedic Surgery | Admitting: Orthopaedic Surgery

## 2020-04-05 DIAGNOSIS — Z20822 Contact with and (suspected) exposure to covid-19: Secondary | ICD-10-CM | POA: Diagnosis not present

## 2020-04-05 DIAGNOSIS — Z01812 Encounter for preprocedural laboratory examination: Secondary | ICD-10-CM | POA: Insufficient documentation

## 2020-04-05 LAB — SARS CORONAVIRUS 2 (TAT 6-24 HRS): SARS Coronavirus 2: NEGATIVE

## 2020-04-05 NOTE — Care Plan (Signed)
Ortho Bundle Case Management Note  Patient Details  Name: Cathy [name] MRN: [id number] Date of Birth: 02-Mar-1946     Spoke with patient prior to surgery. She will discharge to home with family. Rolling walker ordered for home use. HHPT referral to [facility] and OPPT set up with [place].  Patient and MD in agreement with plan. Choice offered.                 DME Arranged:  [place] rolling DME Agency:  [facility]  HH Arranged:  PT [place] Agency:  [facility] (formerly [facility])  Additional Comments: Please contact me with any questions of if this plan should need to change.  Ladell Heads,  [place] Specialist  [phone] 04/05/2020, 11:47 AM

## 2020-04-08 MED ORDER — TRANEXAMIC ACID 1000 MG/10ML IV SOLN
2000.0000 mg | INTRAVENOUS | Status: DC
  Filled 2020-04-08: qty 20

## 2020-04-08 MED ORDER — BUPIVACAINE LIPOSOME 1.3 % IJ SUSP
10.0000 mL | Freq: Once | INTRAMUSCULAR | Status: DC
  Filled 2020-04-08: qty 10

## 2020-04-09 ENCOUNTER — Encounter (HOSPITAL_COMMUNITY)
Admission: RE | Disposition: A | Discharge status: Home / Self Care | Payer: Self-pay | Source: Home / Self Care | Attending: Orthopaedic Surgery

## 2020-04-09 ENCOUNTER — Ambulatory Visit (HOSPITAL_COMMUNITY): Payer: Medicare Other | Admitting: Certified Registered Nurse Anesthetist

## 2020-04-09 ENCOUNTER — Other Ambulatory Visit: Payer: Self-pay

## 2020-04-09 ENCOUNTER — Ambulatory Visit (HOSPITAL_COMMUNITY): Payer: Medicare Other

## 2020-04-09 ENCOUNTER — Observation Stay (HOSPITAL_COMMUNITY)
Admission: RE | Admit: 2020-04-09 | Discharge: 2020-04-10 | Disposition: A | Discharge status: Home / Self Care | Payer: Medicare Other | Attending: Orthopaedic Surgery | Admitting: Orthopaedic Surgery

## 2020-04-09 ENCOUNTER — Encounter (HOSPITAL_COMMUNITY): Payer: Self-pay | Admitting: Orthopaedic Surgery

## 2020-04-09 DIAGNOSIS — Z8614 Personal history of Methicillin resistant Staphylococcus aureus infection: Secondary | ICD-10-CM | POA: Diagnosis not present

## 2020-04-09 DIAGNOSIS — F329 Major depressive disorder, single episode, unspecified: Secondary | ICD-10-CM | POA: Insufficient documentation

## 2020-04-09 DIAGNOSIS — E119 Type 2 diabetes mellitus without complications: Secondary | ICD-10-CM | POA: Insufficient documentation

## 2020-04-09 DIAGNOSIS — Z87891 Personal history of nicotine dependence: Secondary | ICD-10-CM | POA: Insufficient documentation

## 2020-04-09 DIAGNOSIS — Z79899 Other long term (current) drug therapy: Secondary | ICD-10-CM | POA: Diagnosis not present

## 2020-04-09 DIAGNOSIS — I10 Essential (primary) hypertension: Secondary | ICD-10-CM | POA: Diagnosis not present

## 2020-04-09 DIAGNOSIS — K2100 Gastro-esophageal reflux disease with esophagitis, without bleeding: Secondary | ICD-10-CM | POA: Insufficient documentation

## 2020-04-09 DIAGNOSIS — M1611 Unilateral primary osteoarthritis, right hip: Principal | ICD-10-CM | POA: Diagnosis present

## 2020-04-09 DIAGNOSIS — M25559 Pain in unspecified hip: Secondary | ICD-10-CM

## 2020-04-09 DIAGNOSIS — E785 Hyperlipidemia, unspecified: Secondary | ICD-10-CM | POA: Diagnosis not present

## 2020-04-09 DIAGNOSIS — Z791 Long term (current) use of non-steroidal anti-inflammatories (NSAID): Secondary | ICD-10-CM | POA: Diagnosis not present

## 2020-04-09 DIAGNOSIS — Z7982 Long term (current) use of aspirin: Secondary | ICD-10-CM | POA: Diagnosis not present

## 2020-04-09 HISTORY — PX: TOTAL HIP ARTHROPLASTY: SHX124

## 2020-04-09 LAB — TYPE AND SCREEN
ABO/RH(D): O POS
Antibody Screen: NEGATIVE

## 2020-04-09 LAB — GLUCOSE, CAPILLARY
Glucose-Capillary: 175 mg/dL — ABNORMAL HIGH (ref 70–99)
Glucose-Capillary: 133 mg/dL — ABNORMAL HIGH (ref 70–99)
Glucose-Capillary: 135 mg/dL — ABNORMAL HIGH (ref 70–99)

## 2020-04-09 SURGERY — ARTHROPLASTY, HIP, TOTAL, ANTERIOR APPROACH
Anesthesia: General | Site: Hip | Laterality: Right

## 2020-04-09 MED ORDER — METHOCARBAMOL 500 MG IVPB - SIMPLE MED
500.0000 mg | Freq: Four times a day (QID) | INTRAVENOUS | Status: DC | PRN
  Administered 2020-04-09: 500 mg via INTRAVENOUS
  Filled 2020-04-09: qty 50

## 2020-04-09 MED ORDER — HYDROCODONE-ACETAMINOPHEN 5-325 MG PO TABS: 1.0000 | ORAL_TABLET | ORAL | Status: DC | PRN

## 2020-04-09 MED ORDER — METOCLOPRAMIDE HCL 5 MG PO TABS: 5.0000 mg | ORAL_TABLET | Freq: Three times a day (TID) | ORAL | Status: DC | PRN

## 2020-04-09 MED ORDER — OXYCODONE HCL 5 MG PO TABS
5.0000 mg | ORAL_TABLET | Freq: Once | ORAL | Status: AC | PRN
  Administered 2020-04-09: 5 mg via ORAL

## 2020-04-09 MED ORDER — KETOROLAC TROMETHAMINE 30 MG/ML IJ SOLN
7.5000 mg | Freq: Four times a day (QID) | INTRAMUSCULAR | Status: AC
  Administered 2020-04-09 – 2020-04-10 (×3): 7.5 mg via INTRAVENOUS
  Filled 2020-04-09 (×3): qty 1

## 2020-04-09 MED ORDER — ONDANSETRON HCL 4 MG/2ML IJ SOLN: 4.0000 mg | Freq: Four times a day (QID) | INTRAMUSCULAR | Status: DC | PRN

## 2020-04-09 MED ORDER — DEXAMETHASONE SODIUM PHOSPHATE 10 MG/ML IJ SOLN
INTRAMUSCULAR | Status: AC
  Filled 2020-04-09: qty 1

## 2020-04-09 MED ORDER — TRANEXAMIC ACID-NACL 1000-0.7 MG/100ML-% IV SOLN
1000.0000 mg | INTRAVENOUS | Status: AC
  Administered 2020-04-09: 1000 mg via INTRAVENOUS
  Filled 2020-04-09: qty 100

## 2020-04-09 MED ORDER — MORPHINE SULFATE (PF) 4 MG/ML IV SOLN
INTRAVENOUS | Status: AC
  Filled 2020-04-09: qty 1

## 2020-04-09 MED ORDER — LORATADINE 10 MG PO TABS
10.0000 mg | ORAL_TABLET | Freq: Every day | ORAL | Status: DC
  Administered 2020-04-10: 10 mg via ORAL
  Filled 2020-04-09 (×2): qty 1

## 2020-04-09 MED ORDER — MIDAZOLAM HCL 2 MG/2ML IJ SOLN
INTRAMUSCULAR | Status: DC | PRN
  Administered 2020-04-09: 1 mg via INTRAVENOUS

## 2020-04-09 MED ORDER — TRANEXAMIC ACID 1000 MG/10ML IV SOLN
INTRAVENOUS | Status: DC | PRN
  Administered 2020-04-09: 2000 mg via TOPICAL

## 2020-04-09 MED ORDER — KETOROLAC TROMETHAMINE 30 MG/ML IJ SOLN
INTRAMUSCULAR | Status: AC
  Administered 2020-04-09: 7.5 mg via INTRAVENOUS
  Filled 2020-04-09: qty 1

## 2020-04-09 MED ORDER — DIPHENHYDRAMINE HCL 12.5 MG/5ML PO ELIX: 12.5000 mg | ORAL_SOLUTION | ORAL | Status: DC | PRN

## 2020-04-09 MED ORDER — MEPERIDINE HCL 50 MG/ML IJ SOLN: 6.2500 mg | INTRAMUSCULAR | Status: DC | PRN

## 2020-04-09 MED ORDER — HYDROCODONE-ACETAMINOPHEN 7.5-325 MG PO TABS
1.0000 | ORAL_TABLET | ORAL | Status: DC | PRN
  Administered 2020-04-09 – 2020-04-10 (×2): 1 via ORAL
  Administered 2020-04-10: 2 via ORAL
  Filled 2020-04-09: qty 1
  Filled 2020-04-09 (×2): qty 2

## 2020-04-09 MED ORDER — OXYCODONE HCL 5 MG/5ML PO SOLN: 5.0000 mg | Freq: Once | ORAL | Status: AC | PRN

## 2020-04-09 MED ORDER — TRANEXAMIC ACID-NACL 1000-0.7 MG/100ML-% IV SOLN
INTRAVENOUS | Status: AC
  Filled 2020-04-09: qty 100

## 2020-04-09 MED ORDER — LACTATED RINGERS IV SOLN: INTRAVENOUS | Status: DC

## 2020-04-09 MED ORDER — MORPHINE SULFATE (PF) 2 MG/ML IV SOLN: 0.5000 mg | INTRAVENOUS | Status: DC | PRN

## 2020-04-09 MED ORDER — PROPOFOL 10 MG/ML IV BOLUS
INTRAVENOUS | Status: AC
  Filled 2020-04-09: qty 20

## 2020-04-09 MED ORDER — VANCOMYCIN HCL IN DEXTROSE 1-5 GM/200ML-% IV SOLN
1000.0000 mg | Freq: Two times a day (BID) | INTRAVENOUS | Status: AC
  Administered 2020-04-09: 1000 mg via INTRAVENOUS
  Filled 2020-04-09: qty 200

## 2020-04-09 MED ORDER — METHOCARBAMOL 500 MG IVPB - SIMPLE MED
INTRAVENOUS | Status: AC
  Filled 2020-04-09: qty 50

## 2020-04-09 MED ORDER — LIDOCAINE 2% (20 MG/ML) 5 ML SYRINGE
INTRAMUSCULAR | Status: DC | PRN
  Administered 2020-04-09: 40 mg via INTRAVENOUS

## 2020-04-09 MED ORDER — ONDANSETRON HCL 4 MG PO TABS: 4.0000 mg | ORAL_TABLET | Freq: Four times a day (QID) | ORAL | Status: DC | PRN

## 2020-04-09 MED ORDER — FENTANYL CITRATE (PF) 100 MCG/2ML IJ SOLN
INTRAMUSCULAR | Status: AC
  Filled 2020-04-09: qty 2

## 2020-04-09 MED ORDER — DEXMEDETOMIDINE HCL IN NACL 200 MCG/50ML IV SOLN
INTRAVENOUS | Status: DC | PRN
  Administered 2020-04-09 (×2): 8 ug via INTRAVENOUS
  Administered 2020-04-09: 4 ug via INTRAVENOUS

## 2020-04-09 MED ORDER — FENTANYL CITRATE (PF) 100 MCG/2ML IJ SOLN: 25.0000 ug | INTRAMUSCULAR | Status: DC | PRN

## 2020-04-09 MED ORDER — SUGAMMADEX SODIUM 200 MG/2ML IV SOLN
INTRAVENOUS | Status: DC | PRN
  Administered 2020-04-09: 130 mg via INTRAVENOUS

## 2020-04-09 MED ORDER — ACETAMINOPHEN 500 MG PO TABS
500.0000 mg | ORAL_TABLET | Freq: Four times a day (QID) | ORAL | Status: DC
  Administered 2020-04-09 – 2020-04-10 (×3): 500 mg via ORAL
  Filled 2020-04-09 (×3): qty 1

## 2020-04-09 MED ORDER — OXYCODONE HCL 5 MG PO TABS
ORAL_TABLET | ORAL | Status: AC
  Filled 2020-04-09: qty 1

## 2020-04-09 MED ORDER — DEXAMETHASONE SODIUM PHOSPHATE 10 MG/ML IJ SOLN
INTRAMUSCULAR | Status: DC | PRN
  Administered 2020-04-09: 4 mg via INTRAVENOUS

## 2020-04-09 MED ORDER — LIDOCAINE 2% (20 MG/ML) 5 ML SYRINGE
INTRAMUSCULAR | Status: AC
  Filled 2020-04-09: qty 5

## 2020-04-09 MED ORDER — POVIDONE-IODINE 10 % EX SWAB
2.0000 "application " | Freq: Once | CUTANEOUS | Status: AC
  Administered 2020-04-09: 2 via TOPICAL

## 2020-04-09 MED ORDER — FENTANYL CITRATE (PF) 250 MCG/5ML IJ SOLN
INTRAMUSCULAR | Status: DC | PRN
  Administered 2020-04-09: 100 ug via INTRAVENOUS

## 2020-04-09 MED ORDER — VANCOMYCIN HCL IN DEXTROSE 1-5 GM/200ML-% IV SOLN
1000.0000 mg | INTRAVENOUS | Status: AC
  Administered 2020-04-09: 1000 mg via INTRAVENOUS
  Filled 2020-04-09: qty 200

## 2020-04-09 MED ORDER — BUPIVACAINE-EPINEPHRINE 0.5% -1:200000 IJ SOLN
INTRAMUSCULAR | Status: DC | PRN
  Administered 2020-04-09: 30 mL

## 2020-04-09 MED ORDER — ALUM & MAG HYDROXIDE-SIMETH 200-200-20 MG/5ML PO SUSP: 30.0000 mL | ORAL | Status: DC | PRN

## 2020-04-09 MED ORDER — MENTHOL 3 MG MT LOZG: 1.0000 | LOZENGE | OROMUCOSAL | Status: DC | PRN

## 2020-04-09 MED ORDER — DOCUSATE SODIUM 100 MG PO CAPS
100.0000 mg | ORAL_CAPSULE | Freq: Two times a day (BID) | ORAL | Status: DC
  Administered 2020-04-09 – 2020-04-10 (×2): 100 mg via ORAL
  Filled 2020-04-09 (×3): qty 1

## 2020-04-09 MED ORDER — MIDAZOLAM HCL 2 MG/2ML IJ SOLN
INTRAMUSCULAR | Status: AC
  Filled 2020-04-09: qty 2

## 2020-04-09 MED ORDER — HYDROMORPHONE HCL 2 MG/ML IJ SOLN
INTRAMUSCULAR | Status: AC
  Filled 2020-04-09: qty 1

## 2020-04-09 MED ORDER — ONDANSETRON HCL 4 MG/2ML IJ SOLN
INTRAMUSCULAR | Status: AC
  Filled 2020-04-09: qty 2

## 2020-04-09 MED ORDER — ONDANSETRON HCL 4 MG/2ML IJ SOLN: 4.0000 mg | Freq: Once | INTRAMUSCULAR | Status: DC | PRN

## 2020-04-09 MED ORDER — EPHEDRINE SULFATE-NACL 50-0.9 MG/10ML-% IV SOSY
PREFILLED_SYRINGE | INTRAVENOUS | Status: DC | PRN
  Administered 2020-04-09 (×2): 10 mg via INTRAVENOUS
  Administered 2020-04-09: 5 mg via INTRAVENOUS

## 2020-04-09 MED ORDER — LISINOPRIL 10 MG PO TABS
10.0000 mg | ORAL_TABLET | Freq: Every day | ORAL | Status: DC
  Administered 2020-04-10: 10 mg via ORAL
  Filled 2020-04-09 (×2): qty 1

## 2020-04-09 MED ORDER — METOCLOPRAMIDE HCL 5 MG/ML IJ SOLN: 5.0000 mg | Freq: Three times a day (TID) | INTRAMUSCULAR | Status: DC | PRN

## 2020-04-09 MED ORDER — HYDROMORPHONE HCL 1 MG/ML IJ SOLN
INTRAMUSCULAR | Status: DC | PRN
  Administered 2020-04-09 (×2): .5 mg via INTRAVENOUS

## 2020-04-09 MED ORDER — 0.9 % SODIUM CHLORIDE (POUR BTL) OPTIME
TOPICAL | Status: DC | PRN
  Administered 2020-04-09: 1000 mL

## 2020-04-09 MED ORDER — ACETAMINOPHEN 325 MG PO TABS: 325.0000 mg | ORAL_TABLET | Freq: Four times a day (QID) | ORAL | Status: DC | PRN

## 2020-04-09 MED ORDER — TRANEXAMIC ACID-NACL 1000-0.7 MG/100ML-% IV SOLN
1000.0000 mg | Freq: Once | INTRAVENOUS | Status: AC
  Administered 2020-04-09: 1000 mg via INTRAVENOUS

## 2020-04-09 MED ORDER — MORPHINE SULFATE (PF) 4 MG/ML IV SOLN: 0.5000 mg | INTRAVENOUS | Status: DC | PRN

## 2020-04-09 MED ORDER — METHOCARBAMOL 500 MG PO TABS
500.0000 mg | ORAL_TABLET | Freq: Four times a day (QID) | ORAL | Status: DC | PRN
  Administered 2020-04-09 – 2020-04-10 (×2): 500 mg via ORAL
  Filled 2020-04-09 (×2): qty 1

## 2020-04-09 MED ORDER — ASPIRIN 81 MG PO CHEW
81.0000 mg | CHEWABLE_TABLET | Freq: Two times a day (BID) | ORAL | Status: DC
  Administered 2020-04-10: 81 mg via ORAL
  Filled 2020-04-09 (×2): qty 1

## 2020-04-09 MED ORDER — ROCURONIUM BROMIDE 10 MG/ML (PF) SYRINGE
PREFILLED_SYRINGE | INTRAVENOUS | Status: DC | PRN
  Administered 2020-04-09: 60 mg via INTRAVENOUS
  Administered 2020-04-09: 10 mg via INTRAVENOUS

## 2020-04-09 MED ORDER — BISACODYL 5 MG PO TBEC: 5.0000 mg | DELAYED_RELEASE_TABLET | Freq: Every day | ORAL | Status: DC | PRN

## 2020-04-09 MED ORDER — BUPIVACAINE LIPOSOME 1.3 % IJ SUSP
INTRAMUSCULAR | Status: DC | PRN
  Administered 2020-04-09: 10 mL

## 2020-04-09 MED ORDER — PHENOL 1.4 % MT LIQD: 1.0000 | OROMUCOSAL | Status: DC | PRN

## 2020-04-09 MED ORDER — BUPIVACAINE HCL 0.25 % IJ SOLN
INTRAMUSCULAR | Status: AC
  Filled 2020-04-09: qty 1

## 2020-04-09 MED ORDER — ONDANSETRON HCL 4 MG/2ML IJ SOLN
INTRAMUSCULAR | Status: DC | PRN
  Administered 2020-04-09: 4 mg via INTRAVENOUS

## 2020-04-09 MED ORDER — PROPOFOL 10 MG/ML IV BOLUS
INTRAVENOUS | Status: DC | PRN
  Administered 2020-04-09: 140 mg via INTRAVENOUS

## 2020-04-09 MED ORDER — SIMVASTATIN 40 MG PO TABS
40.0000 mg | ORAL_TABLET | Freq: Every day | ORAL | Status: DC
  Administered 2020-04-09: 40 mg via ORAL
  Filled 2020-04-09: qty 1

## 2020-04-09 MED ORDER — PHENYLEPHRINE HCL-NACL 10-0.9 MG/250ML-% IV SOLN
INTRAVENOUS | Status: DC | PRN
Start: 2020-04-09 — End: 2020-04-09
  Administered 2020-04-09: 20 ug/min via INTRAVENOUS

## 2020-04-09 SURGICAL SUPPLY — 43 items
BAG DECANTER FOR FLEXI CONT (MISCELLANEOUS) ×3 IMPLANT
BLADE SAW SGTL 18X1.27X75 (BLADE) ×2 IMPLANT
BLADE SAW SGTL 18X1.27X75MM (BLADE) ×1
BOOTIES KNEE HIGH SLOAN (MISCELLANEOUS) ×3 IMPLANT
CELLS DAT CNTRL 66122 CELL SVR (MISCELLANEOUS) ×1 IMPLANT
COVER PERINEAL POST (MISCELLANEOUS) ×3 IMPLANT
COVER SURGICAL LIGHT HANDLE (MISCELLANEOUS) ×3 IMPLANT
COVER WAND RF STERILE (DRAPES) IMPLANT
CUP GRIPTON 48MM 100 HIP (Hips) ×3 IMPLANT
DECANTER SPIKE VIAL GLASS SM (MISCELLANEOUS) ×3 IMPLANT
DRAPE IMP U-DRAPE 54X76 (DRAPES) ×3 IMPLANT
DRAPE STERI IOBAN 125X83 (DRAPES) ×3 IMPLANT
DRAPE U-SHAPE 47X51 STRL (DRAPES) ×6 IMPLANT
DRSG AQUACEL AG ADV 3.5X 6 (GAUZE/BANDAGES/DRESSINGS) ×3 IMPLANT
DURAPREP 26ML APPLICATOR (WOUND CARE) ×3 IMPLANT
ELECT BLADE TIP CTD 4 INCH (ELECTRODE) ×3 IMPLANT
ELECT REM PT RETURN 15FT ADLT (MISCELLANEOUS) ×3 IMPLANT
ELIMINATOR HOLE APEX DEPUY (Hips) ×3 IMPLANT
GLOVE BIO SURGEON STRL SZ8 (GLOVE) ×6 IMPLANT
GLOVE BIOGEL PI IND STRL 8 (GLOVE) ×2 IMPLANT
GLOVE BIOGEL PI INDICATOR 8 (GLOVE) ×4
GOWN STRL REUS W/TWL XL LVL3 (GOWN DISPOSABLE) ×6 IMPLANT
HEAD FEMORAL 32 CERAMIC (Hips) ×3 IMPLANT
HOLDER FOLEY CATH W/STRAP (MISCELLANEOUS) ×3 IMPLANT
KIT TURNOVER KIT A (KITS) IMPLANT
MANIFOLD NEPTUNE II (INSTRUMENTS) ×3 IMPLANT
NEEDLE HYPO 22GX1.5 SAFETY (NEEDLE) ×3 IMPLANT
NS IRRIG 1000ML POUR BTL (IV SOLUTION) ×3 IMPLANT
PACK ANTERIOR HIP CUSTOM (KITS) ×3 IMPLANT
PENCIL SMOKE EVACUATOR (MISCELLANEOUS) IMPLANT
PINN ALTRX NEUT ID X OD 32X48 ×3 IMPLANT
PROTECTOR NERVE ULNAR (MISCELLANEOUS) ×3 IMPLANT
RTRCTR WOUND ALEXIS 18CM MED (MISCELLANEOUS) ×3
STEM FEMORAL SZ 6MM STD ACTIS (Stem) ×3 IMPLANT
SUT ETHIBOND NAB CT1 #1 30IN (SUTURE) ×6 IMPLANT
SUT VIC AB 1 CT1 36 (SUTURE) ×3 IMPLANT
SUT VIC AB 2-0 CT1 27 (SUTURE) ×3
SUT VIC AB 2-0 CT1 TAPERPNT 27 (SUTURE) ×1 IMPLANT
SUT VICRYL AB 3-0 FS1 BRD 27IN (SUTURE) ×3 IMPLANT
SUT VLOC 180 0 24IN GS25 (SUTURE) ×3 IMPLANT
SYR 50ML LL SCALE MARK (SYRINGE) ×3 IMPLANT
TRAY FOLEY MTR SLVR 16FR STAT (SET/KITS/TRAYS/PACK) ×3 IMPLANT
YANKAUER SUCT BULB TIP 10FT TU (MISCELLANEOUS) ×3 IMPLANT

## 2020-04-09 NOTE — Op Note (Signed)

## 2020-04-09 NOTE — Anesthesia Preprocedure Evaluation (Addendum)
Anesthesia Evaluation  Patient identified by MRN, date of birth, ID band Patient awake    Reviewed: Allergy & Precautions, NPO status , Patient's Chart, lab work & pertinent test results  Airway Mallampati: I  TM Distance: >3 FB Neck ROM: Full    Dental no notable dental hx. (+) Chipped, Partial Upper,    Pulmonary former smoker,    Pulmonary exam normal breath sounds clear to auscultation       Cardiovascular hypertension, Pt. 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   Abdominal   Peds  Hematology negative hematology ROS (+)   Anesthesia Other Findings   Reproductive/Obstetrics                            Anesthesia Physical Anesthesia Plan  ASA: II  Anesthesia Plan: General   Post-op Pain Management:    Induction: Intravenous  PONV Risk Score and Plan: 4 or greater and Ondansetron, Treatment may vary due to age or medical condition and Dexamethasone  Airway Management Planned: Oral ETT  Additional Equipment:   Intra-op Plan:   Post-operative Plan: Extubation in OR  Informed Consent: I have reviewed the patients History and Physical, chart, labs and discussed the procedure including the risks, benefits and alternatives for the proposed anesthesia with the patient or authorized representative who has indicated his/her understanding and acceptance.     Dental advisory given  Plan Discussed with: CRNA and Surgeon  Anesthesia Plan Comments: (Patient refuses spinal/regional anesthesia.)       Anesthesia Quick Evaluation

## 2020-04-09 NOTE — Transfer of Care (Signed)
Immediate Anesthesia Transfer of Care Note  Patient: Cathy [name]  Procedure(s) Performed: [facility] TOTAL HIP ARTHROPLASTY ANTERIOR APPROACH (Right Hip)  Patient Location: PACU  Anesthesia Type:General  Level of Consciousness: awake, alert  and oriented  Airway & Oxygen Therapy: Patient Spontanous Breathing and Patient connected to face mask oxygen  Post-op Assessment: Report given to RN and Post -op Vital signs reviewed and stable  Post vital signs: Reviewed and stable  Last Vitals:  Vitals Value Taken Time  BP    Temp    Pulse 98 04/09/20 1153  Resp 17 04/09/20 1153  SpO2 100 % 04/09/20 1153  Vitals shown include unvalidated device data.  Last Pain:  Vitals:   04/09/20 0815  TempSrc: Oral  PainSc:          Complications: No apparent anesthesia complications

## 2020-04-09 NOTE — Anesthesia Postprocedure Evaluation (Signed)
Anesthesia Post Note  Patient: JALESIA SITES  Procedure(s) Performed: [facility] TOTAL HIP ARTHROPLASTY ANTERIOR APPROACH (Right Hip)     Patient location during evaluation: PACU Anesthesia Type: General Level of consciousness: awake and alert Pain management: pain level controlled Vital Signs Assessment: post-procedure vital signs reviewed and stable Respiratory status: spontaneous breathing, nonlabored ventilation, respiratory function stable and patient connected to nasal cannula oxygen Cardiovascular status: blood pressure returned to baseline and stable Postop Assessment: no apparent nausea or vomiting Anesthetic complications: no    Last Vitals:  Vitals:   04/09/20 1415 04/09/20 1430  BP: 124/74 128/78  Pulse: (!) 101 98  Resp: 19 17  Temp:    SpO2: 94% 97%    Last Pain:  Vitals:   04/09/20 1415  TempSrc:   PainSc: 0-No pain                 Tiajuana Amass

## 2020-04-09 NOTE — Interval H&P Note (Signed)
History and Physical Interval Note:  04/09/2020 9:15 AM  Cathy [name]  has presented today for surgery, with the diagnosis of RIGHT HIP DEGENERATIVE JOINT DISEASE.  The various methods of treatment have been discussed with the patient and family. After consideration of risks, benefits and other options for treatment, the patient has consented to  Procedure(s): [place] (Right) as a surgical intervention.  The patient's history has been reviewed, patient examined, no change in status, stable for surgery.  I have reviewed the patient's chart and labs.  Questions were answered to the patient's satisfaction.     Hessie Dibble

## 2020-04-09 NOTE — Plan of Care (Signed)
  Problem: Clinical Measurements: Goal: Diagnostic test results will improve Outcome: Progressing   Problem: Clinical Measurements: Goal: Cardiovascular complication will be avoided Outcome: Progressing   Problem: Nutrition: Goal: Adequate nutrition will be maintained Outcome: Progressing   Problem: Coping: Goal: Level of anxiety will decrease Outcome: Progressing   Problem: Elimination: Goal: Will not experience complications related to bowel motility Outcome: Progressing

## 2020-04-09 NOTE — Plan of Care (Signed)

## 2020-04-09 NOTE — Anesthesia Procedure Notes (Signed)
Procedure Name: Intubation Date/Time: 04/09/2020 10:14 AM Performed by: Eben Burow, CRNA Pre-anesthesia Checklist: Patient identified, Emergency Drugs available, Suction available, Patient being monitored and Timeout performed Patient Re-evaluated:Patient Re-evaluated prior to induction Oxygen Delivery Method: Circle system utilized Preoxygenation: Pre-oxygenation with 100% oxygen Induction Type: IV induction Ventilation: Mask ventilation without difficulty Laryngoscope Size: Mac and 4 Grade View: Grade I Tube type: Oral Tube size: 7.0 mm Number of attempts: 1 Airway Equipment and Method: Stylet Placement Confirmation: ETT inserted through vocal cords under direct vision,  positive ETCO2 and breath sounds checked- equal and bilateral Secured at: 20 cm Tube secured with: Tape Dental Injury: Teeth and Oropharynx as per pre-operative assessment

## 2020-04-10 ENCOUNTER — Encounter: Payer: Self-pay | Admitting: *Deleted

## 2020-04-10 ENCOUNTER — Other Ambulatory Visit: Payer: Self-pay | Admitting: Family Medicine

## 2020-04-10 DIAGNOSIS — Z791 Long term (current) use of non-steroidal anti-inflammatories (NSAID): Secondary | ICD-10-CM | POA: Diagnosis not present

## 2020-04-10 DIAGNOSIS — Z87891 Personal history of nicotine dependence: Secondary | ICD-10-CM | POA: Diagnosis not present

## 2020-04-10 DIAGNOSIS — K2100 Gastro-esophageal reflux disease with esophagitis, without bleeding: Secondary | ICD-10-CM | POA: Diagnosis not present

## 2020-04-10 DIAGNOSIS — Z79899 Other long term (current) drug therapy: Secondary | ICD-10-CM | POA: Diagnosis not present

## 2020-04-10 DIAGNOSIS — I10 Essential (primary) hypertension: Secondary | ICD-10-CM | POA: Diagnosis not present

## 2020-04-10 DIAGNOSIS — E119 Type 2 diabetes mellitus without complications: Secondary | ICD-10-CM | POA: Diagnosis not present

## 2020-04-10 DIAGNOSIS — Z96641 Presence of right artificial hip joint: Secondary | ICD-10-CM | POA: Diagnosis not present

## 2020-04-10 DIAGNOSIS — Z7982 Long term (current) use of aspirin: Secondary | ICD-10-CM | POA: Diagnosis not present

## 2020-04-10 DIAGNOSIS — Z8614 Personal history of Methicillin resistant Staphylococcus aureus infection: Secondary | ICD-10-CM | POA: Diagnosis not present

## 2020-04-10 DIAGNOSIS — M1611 Unilateral primary osteoarthritis, right hip: Secondary | ICD-10-CM | POA: Diagnosis not present

## 2020-04-10 DIAGNOSIS — E785 Hyperlipidemia, unspecified: Secondary | ICD-10-CM | POA: Diagnosis not present

## 2020-04-10 MED ORDER — LISINOPRIL 10 MG PO TABS: ORAL_TABLET | ORAL | 0 refills | Status: DC

## 2020-04-10 MED ORDER — TIZANIDINE HCL 4 MG PO TABS: 4.0000 mg | ORAL_TABLET | Freq: Four times a day (QID) | ORAL | 1 refills | Status: DC | PRN

## 2020-04-10 MED ORDER — ASPIRIN 81 MG PO TABS: 81.0000 mg | ORAL_TABLET | Freq: Two times a day (BID) | ORAL | 0 refills | Status: DC

## 2020-04-10 MED ORDER — HYDROCODONE-ACETAMINOPHEN 5-325 MG PO TABS: 1.0000 | ORAL_TABLET | Freq: Four times a day (QID) | ORAL | 0 refills | Status: DC | PRN

## 2020-04-10 NOTE — Plan of Care (Signed)
Problem: Education: Goal: Knowledge of General Education information will improve Description: Including pain rating scale, medication(s)/side effects and non-pharmacologic comfort measures 04/10/2020 1626 by Hubert Azure, RN Outcome: Adequate for Discharge 04/10/2020 1625 by Hubert Azure, RN Outcome: Progressing 04/10/2020 1134 by Hubert Azure, RN Outcome: Progressing   Problem: Health Behavior/Discharge Planning: Goal: Ability to manage health-related needs will improve 04/10/2020 1626 by Hubert Azure, RN Outcome: Adequate for Discharge 04/10/2020 1625 by Hubert Azure, RN Outcome: Progressing 04/10/2020 1134 by Hubert Azure, RN Outcome: Progressing   Problem: Clinical Measurements: Goal: Ability to maintain clinical measurements within normal limits will improve 04/10/2020 1626 by Hubert Azure, RN Outcome: Adequate for Discharge 04/10/2020 1134 by Hubert Azure, RN Outcome: Progressing Goal: Will remain free from infection 04/10/2020 1626 by Hubert Azure, RN Outcome: Adequate for Discharge 04/10/2020 1625 by Hubert Azure, RN Outcome: Progressing 04/10/2020 1134 by Hubert Azure, RN Outcome: Progressing Goal: Diagnostic test results will improve 04/10/2020 1626 by Hubert Azure, RN Outcome: Adequate for Discharge 04/10/2020 1625 by Hubert Azure, RN Outcome: Progressing 04/10/2020 1134 by Hubert Azure, RN Outcome: Progressing Goal: Respiratory complications will improve 04/10/2020 1626 by Hubert Azure, RN Outcome: Adequate for Discharge 04/10/2020 1625 by Hubert Azure, RN Outcome: Progressing 04/10/2020 1134 by Hubert Azure, RN Outcome: Progressing Goal: Cardiovascular complication will be avoided 04/10/2020 1626 by Hubert Azure, RN Outcome: Adequate for Discharge 04/10/2020 1134 by Hubert Azure, RN Outcome: Progressing   Problem: Activity: Goal: Risk for activity intolerance will decrease 04/10/2020 1626 by Hubert Azure,  RN Outcome: Adequate for Discharge 04/10/2020 1134 by Hubert Azure, RN Outcome: Progressing   Problem: Nutrition: Goal: Adequate nutrition will be maintained 04/10/2020 1626 by Hubert Azure, RN Outcome: Adequate for Discharge 04/10/2020 1134 by Hubert Azure, RN Outcome: Progressing   Problem: Coping: Goal: Level of anxiety will decrease 04/10/2020 1626 by Hubert Azure, RN Outcome: Adequate for Discharge 04/10/2020 1134 by Hubert Azure, RN Outcome: Progressing   Problem: Elimination: Goal: Will not experience complications related to bowel motility 04/10/2020 1626 by Hubert Azure, RN Outcome: Adequate for Discharge 04/10/2020 1134 by Hubert Azure, RN Outcome: Progressing Goal: Will not experience complications related to urinary retention 04/10/2020 1626 by Hubert Azure, RN Outcome: Adequate for Discharge 04/10/2020 1134 by Hubert Azure, RN Outcome: Progressing   Problem: Pain Managment: Goal: General experience of comfort will improve 04/10/2020 1626 by Hubert Azure, RN Outcome: Adequate for Discharge 04/10/2020 1134 by Hubert Azure, RN Outcome: Progressing   Problem: Safety: Goal: Ability to remain free from injury will improve 04/10/2020 1626 by Hubert Azure, RN Outcome: Adequate for Discharge 04/10/2020 1134 by Hubert Azure, RN Outcome: Progressing   Problem: Skin Integrity: Goal: Risk for impaired skin integrity will decrease 04/10/2020 1626 by Hubert Azure, RN Outcome: Adequate for Discharge 04/10/2020 1134 by Hubert Azure, RN Outcome: Progressing   Problem: Education: Goal: Knowledge of the prescribed therapeutic regimen will improve 04/10/2020 1626 by Hubert Azure, RN Outcome: Adequate for Discharge 04/10/2020 1134 by Hubert Azure, RN Outcome: Progressing Goal: Understanding of discharge needs will improve 04/10/2020 1626 by Hubert Azure, RN Outcome: Adequate for Discharge 04/10/2020 1134 by Hubert Azure,  RN Outcome: Progressing Goal: Individualized Educational Video(s) 04/10/2020 1626 by Hubert Azure, RN Outcome: Adequate for Discharge 04/10/2020 1134 by Hubert Azure, RN Outcome: Progressing   Problem: Activity:  Goal: Ability to avoid complications of mobility impairment will improve 04/10/2020 1626 by Hubert Azure, RN Outcome: Adequate for Discharge 04/10/2020 1134 by Hubert Azure, RN Outcome: Progressing Goal: Ability to tolerate increased activity will improve 04/10/2020 1626 by Hubert Azure, RN Outcome: Adequate for Discharge 04/10/2020 1134 by Hubert Azure, RN Outcome: Progressing   Problem: Clinical Measurements: Goal: Postoperative complications will be avoided or minimized 04/10/2020 1626 by Hubert Azure, RN Outcome: Adequate for Discharge 04/10/2020 1134 by Hubert Azure, RN Outcome: Progressing   Problem: Pain Management: Goal: Pain level will decrease with appropriate interventions 04/10/2020 1626 by Hubert Azure, RN Outcome: Adequate for Discharge 04/10/2020 1134 by Hubert Azure, RN Outcome: Progressing   Problem: Skin Integrity: Goal: Will show signs of wound healing 04/10/2020 1626 by Hubert Azure, RN Outcome: Adequate for Discharge 04/10/2020 1134 by Hubert Azure, RN Outcome: Progressing

## 2020-04-10 NOTE — Plan of Care (Signed)

## 2020-04-10 NOTE — Progress Notes (Signed)
Met briefly with pt to confirm she has received her DME (rw and 3n1 commode) already from [place] and is aware HHPT arranged with [facility].  No further needs.  Akshaj Besancon, LCSW

## 2020-04-10 NOTE — Evaluation (Signed)
Physical Therapy One Time Evaluation Patient Details Name: Cathy [name] MRN: [id number] DOB: 1946-01-26 Today's Date: 04/10/2020   History of Present Illness  Pt is a 74 year old s/p Right Direct Anterior THA  Clinical Impression  Patient evaluated by Physical Therapy with no further acute PT needs identified. All education has been completed and the patient has no further questions.  Pt ambulated in hallway, practiced safe stair technique and performed LE exercises.  Pt states her son will be home to assist her during recovery.  Pt provided with HEP handout. See below for any follow-up Physical Therapy or equipment needs. PT is signing off. Thank you for this referral.      Follow Up Recommendations Follow surgeon's recommendation for DC plan and follow-up therapies    Equipment Recommendations  Rolling walker with 5" wheels    Recommendations for Other Services       Precautions / Restrictions Precautions Precautions: Fall Restrictions Weight Bearing Restrictions: No      Mobility  Bed Mobility Overal bed mobility: Needs Assistance Bed Mobility: Supine to Sit     Supine to sit: Min guard;HOB elevated     General bed mobility comments: verbal cues for self assist  Transfers Overall transfer level: Needs assistance Equipment used: Rolling walker (2 wheeled) Transfers: Sit to/from Stand Sit to Stand: Min guard         General transfer comment: min/guard for safety, cues for hand placement  Ambulation/Gait Ambulation/Gait assistance: Min guard Gait Distance (Feet): 120 Feet Assistive device: Rolling walker (2 wheeled) Gait Pattern/deviations: Step-to pattern;Decreased stance time - right;Antalgic     General Gait Details: verbal cues for sequence, RW positioning, step length, heel strike  Stairs Stairs: Yes Stairs assistance: Min guard Stair Management: Step to pattern;Forwards;One rail Left Number of Stairs: 3 General stair comments: verbal cues for  sequence, safety; pt performed x2 with left rail  Wheelchair Mobility    Modified Rankin (Stroke Patients Only)       Balance                                             Pertinent Vitals/Pain Pain Assessment: 0-10 Pain Score: 3  Pain Location: right hip Pain Descriptors / Indicators: Sore;Aching Pain Intervention(s): Monitored during session;Repositioned    Home Living Family/patient expects to be discharged to:: Private residence Living Arrangements: Children Available Help at Discharge: Family;Available 24 hours/day Type of Home: House       Home Layout: Two level Home Equipment: None      Prior Function Level of Independence: Independent               Hand Dominance        Extremity/Trunk Assessment        Lower Extremity Assessment Lower Extremity Assessment: RLE deficits/detail RLE Deficits / Details: anticipated post op hip weakness, good quadriceps contraction, able to perform ankle pumps       Communication   Communication: No difficulties  Cognition Arousal/Alertness: Awake/alert Behavior During Therapy: WFL for tasks assessed/performed Overall Cognitive Status: Within Functional Limits for tasks assessed                                        General Comments      Exercises Total Joint Exercises  Ankle Circles/Pumps: AROM;Both;10 reps Quad Sets: AROM;Both;10 reps Heel Slides: AAROM;Right;10 reps Hip ABduction/ADduction: AAROM;AROM;Standing;Supine;Right;10 reps Long Arc Quad: AROM;Right;Seated;10 reps Knee Flexion: AROM;Right;Standing;10 reps Marching in Standing: AROM;Right;Standing;10 reps Standing Hip Extension: AROM;Right;10 reps;Standing(all standing exercises performed with bil UE support)   Assessment/Plan    PT Assessment All further PT needs can be met in the next venue of care  PT Problem List Decreased strength;Decreased mobility;Decreased activity tolerance;Decreased  balance;Decreased knowledge of use of DME       PT Treatment Interventions      PT Goals (Current goals can be found in the Care Plan section)  Acute Rehab PT Goals PT Goal Formulation: All assessment and education complete, DC therapy    Frequency     Barriers to discharge        Co-evaluation               AM-PAC PT "6 Clicks" Mobility  Outcome Measure Help needed turning from your back to your side while in a flat bed without using bedrails?: None Help needed moving from lying on your back to sitting on the side of a flat bed without using bedrails?: A Little Help needed moving to and from a bed to a chair (including a wheelchair)?: A Little Help needed standing up from a chair using your arms (e.g., wheelchair or bedside chair)?: A Little Help needed to walk in hospital room?: A Little Help needed climbing 3-5 steps with a railing? : A Little 6 Click Score: 19    End of Session Equipment Utilized During Treatment: Gait belt Activity Tolerance: Patient tolerated treatment well Patient left: in chair;with call bell/phone within reach;with chair alarm set Nurse Communication: Mobility status PT Visit Diagnosis: Other abnormalities of gait and mobility (R26.89)    Time: 6840-3353 PT Time Calculation (min) (ACUTE ONLY): 19 min   Charges:   PT Evaluation $PT Eval Low Complexity: 1 Low     Kati PT, DPT Acute Rehabilitation Services Office: [phone]  Trena Platt 04/10/2020, 12:29 PM

## 2020-04-10 NOTE — Discharge Summary (Signed)
Patient ID: AMIYRAH LAMERE MRN: [id number] DOB/AGE: May 23, 1946 74 y.o.  Admit date: 04/09/2020 Discharge date: 04/10/2020  Admission Diagnoses:  Principal Problem:   Primary localized osteoarthritis of right hip Active Problems:   Primary osteoarthritis of right hip   Discharge Diagnoses:  Same  Past Medical History:  Diagnosis Date  . Chronic constipation   . Depression   . Diabetes mellitus type II    no longer taking medication  . DJD (degenerative joint disease)    knees  . Female cystocele   . GERD (gastroesophageal reflux disease)   . History of esophagitis   . History of left inguinal hernia   . History of MRSA infection    recurrent carbuncle  . History of recurrent UTIs   . Hyperlipidemia   . Hypertension   . Urgency of urination     Surgeries: Procedure(s): RIGH TOTAL HIP ARTHROPLASTY ANTERIOR APPROACH on 04/09/2020   Consultants:   Discharged Condition: Improved  Hospital Course: SALAH BURLISON is an 74 y.o. female who was admitted 04/09/2020 for operative treatment ofPrimary localized osteoarthritis of right hip. Patient has severe unremitting pain that affects sleep, daily activities, and work/hobbies. After pre-op clearance the patient was taken to the operating room on 04/09/2020 and underwent  Procedure(s): [place].    Patient was given perioperative antibiotics:  Anti-infectives (From admission, onward)   Start     Dose/Rate Route Frequency Ordered Stop   04/09/20 2130  vancomycin (VANCOCIN) IVPB 1000 mg/200 mL premix     1,000 mg 200 mL/hr over 60 Minutes Intravenous Every 12 hours 04/09/20 2054 04/10/20 0004   04/09/20 0745  vancomycin (VANCOCIN) IVPB 1000 mg/200 mL premix     1,000 mg 200 mL/hr over 60 Minutes Intravenous On call to O.R. 04/09/20 0739 04/09/20 1041       Patient was given sequential compression devices, early ambulation, and chemoprophylaxis to prevent DVT.  Patient benefited  maximally from hospital stay and there were no complications.    Recent vital signs:  Patient Vitals for the past 24 hrs:  BP Temp Temp src Pulse Resp SpO2  04/10/20 0504 135/67 97.7 F (36.5 C) Oral 73 18 100 %  04/10/20 0209 133/68 97.7 F (36.5 C) Oral 89 18 97 %  04/09/20 2207 112/68 97.8 F (36.6 C) Oral 79 16 95 %  04/09/20 1850 129/73 97.9 F (36.6 C) Oral 88 17 96 %  04/09/20 1733 131/78 97.9 F (36.6 C) -- 93 18 99 %  04/09/20 1647 134/80 97.9 F (36.6 C) -- -- 20 99 %  04/09/20 1530 119/79 -- -- (!) 105 19 98 %  04/09/20 1515 125/79 -- -- 97 14 98 %  04/09/20 1500 117/78 -- -- 100 13 98 %  04/09/20 1445 126/77 -- -- (!) 103 13 98 %  04/09/20 1430 128/78 -- -- 98 17 97 %  04/09/20 1415 124/74 -- -- (!) 101 19 94 %  04/09/20 1400 120/74 -- -- (!) 101 18 97 %  04/09/20 1345 129/77 -- -- (!) 101 14 97 %  04/09/20 1330 126/79 -- -- 98 14 96 %  04/09/20 1315 127/74 -- -- 97 18 96 %  04/09/20 1300 134/74 -- -- 100 (!) 21 96 %  04/09/20 1245 128/75 -- -- 95 11 96 %  04/09/20 1230 127/74 -- -- 95 15 96 %  04/09/20 1215 135/72 -- -- 94 19 100 %  04/09/20 1200 136/70 -- -- 98 18 100 %  04/09/20 1153 136/69 (!) 97.5 F (36.4 C) -- 95 20 100 %     Recent laboratory studies: No results for input(s): WBC, HGB, HCT, PLT, NA, K, CL, CO2, BUN, CREATININE, GLUCOSE, INR, CALCIUM in the last 72 hours.  Invalid input(s): PT, 2   Discharge Medications:   Allergies as of 04/10/2020      Reactions   Penicillins Hives   Has patient had a PCN reaction causing immediate rash, facial/tongue/throat swelling, SOB or lightheadedness with hypotension: Yes Has patient had a PCN reaction causing severe rash involving mucus membranes or skin necrosis: No Has patient had a PCN reaction that required hospitalization: No Has patient had a PCN reaction occurring within the last 10 years: No If all of the above answers are "NO", then may proceed with Cephalosporin use.      Medication List     STOP taking these medications   meloxicam 7.5 MG tablet Commonly known as: MOBIC   nabumetone 500 MG tablet Commonly known as: Relafen     TAKE these medications   aspirin 81 MG tablet Take 1 tablet (81 mg total) by mouth 2 (two) times daily after a meal. What changed: when to take this   b complex vitamins capsule Take 1 capsule by mouth daily.   Biotin 1000 MCG tablet Take 1,000 mcg by mouth daily.   blood glucose meter kit and supplies Kit Dispense based on patient and insurance preference. Use once daily to test blood glucose. (FOR ICD-E11.9).   Caltrate 600+D 600-400 MG-UNIT tablet Generic drug: Calcium Carbonate-Vitamin D Take 1 tablet by mouth daily.   Cinnamon 500 MG capsule Take 1,000 mg by mouth daily.   CRANBERRY PO Take 2 tablets by mouth daily. 4200 mg   estradiol 0.1 MG/GM vaginal cream Commonly known as: ESTRACE Place 1 Applicatorful vaginally every other day. In the evening   fexofenadine 180 MG tablet Commonly known as: ALLEGRA Take 180 mg by mouth daily.   fluticasone 50 MCG/ACT nasal spray Commonly known as: FLONASE Place 2 sprays into both nostrils daily.   HYDROcodone-acetaminophen 5-325 MG tablet Commonly known as: NORCO/VICODIN Take 1-2 tablets by mouth every 6 (six) hours as needed for moderate pain (post op pain).   lisinopril 10 MG tablet Commonly known as: ZESTRIL TAKE 1 TABLET(10 MG) BY MOUTH DAILY What changed: See the new instructions.   multivitamin with minerals tablet Take 1 tablet by mouth daily. Centrum silver   OneTouch Delica Lancets Fine Misc USE TO CHECK BLOOD SUGAR ONCE DAILY   OneTouch Verio test strip Generic drug: glucose blood USE TO TEST BLOOD GLUCOSE TWICE DAILY   OVER THE COUNTER MEDICATION Take 1 tablet by mouth daily as needed (constipation). Vital Lax   polyethylene glycol 17 g packet Commonly known as: MIRALAX / GLYCOLAX Take 17 g by mouth daily as needed for mild constipation.   simvastatin 40  MG tablet Commonly known as: ZOCOR TAKE 1 TABLET BY MOUTH ONCE DAILY AT 6PM What changed: See the new instructions.   Tart Cherry Advanced Caps Take 2 capsules by mouth daily.   tiZANidine 4 MG tablet Commonly known as: Zanaflex Take 1 tablet (4 mg total) by mouth every 6 (six) hours as needed.   trimethoprim 100 MG tablet Commonly known as: TRIMPEX Take 100 mg by mouth as needed.   Turmeric 500 MG Caps Take 1,000 mg by mouth daily.   Vitamin B-12 2500 MCG Subl Place 2,500 mcg under the tongue daily.   vitamin C 1000  MG tablet Take 1,000 mg by mouth daily.            Durable Medical Equipment  (From admission, onward)         Start     Ordered   04/09/20 2055  DME Walker rolling  Once    Question:  Patient needs a walker to treat with the following condition  Answer:  Primary osteoarthritis of right hip   04/09/20 2054   04/09/20 2055  DME 3 n 1  Once     04/09/20 2054   04/09/20 2055  DME Bedside commode  Once    Question:  Patient needs a bedside commode to treat with the following condition  Answer:  Primary osteoarthritis of right hip   04/09/20 2054          Diagnostic Studies: DG Chest 2 View  Result Date: 04/02/2020 CLINICAL DATA:  Preoperative evaluation. EXAM: CHEST - 2 VIEW COMPARISON:  February 15, 2015 FINDINGS: There is no evidence of acute infiltrate, pleural effusion or pneumothorax. The heart size and mediastinal contours are within normal limits. The visualized skeletal structures are unremarkable. IMPRESSION: No active cardiopulmonary disease. Electronically Signed   By: Virgina Norfolk M.D.   On: 04/02/2020 22:40   DG C-Arm 1-60 Min-No Report  Result Date: 04/09/2020 Fluoroscopy was utilized by the requesting physician.  No radiographic interpretation.   DG HIP OPERATIVE UNILAT W OR W/O PELVIS RIGHT  Result Date: 04/09/2020 CLINICAL DATA:  Right hip degenerative change EXAM: OPERATIVE RIGHT HIP WITH PELVIS COMPARISON:  02/19/2020  FLUOROSCOPY TIME:  Radiation Exposure Index (as provided by the fluoroscopic device): Not available If the device does not provide the exposure index: Fluoroscopy Time:  13 seconds Number of Acquired Images:  7 FINDINGS: Initial images demonstrate degenerative changes of the right hip joint. Subsequent removal of the proximal right femur with spacer placement is seen. Completion film shows total hip arthroplasty. IMPRESSION: Status post right hip replacement Electronically Signed   By: Inez Catalina M.D.   On: 04/09/2020 11:46    Disposition: Discharge disposition: 01-Home or Self Care       Discharge Instructions    Call MD / Call 911   Complete by: As directed    If you experience chest pain or shortness of breath, CALL 911 and be transported to the hospital emergency room.  If you develope a fever above 101 F, pus (white drainage) or increased drainage or redness at the wound, or calf pain, call your surgeon's office.   Constipation Prevention   Complete by: As directed    Drink plenty of fluids.  Prune juice may be helpful.  You may use a stool softener, such as Colace (over the counter) 100 mg twice a day.  Use MiraLax (over the counter) for constipation as needed.   Diet - low sodium heart healthy   Complete by: As directed    Discharge instructions   Complete by: As directed    INSTRUCTIONS AFTER JOINT REPLACEMENT   Remove items at home which could result in a fall. This includes throw rugs or furniture in walking pathways ICE to the affected joint every three hours while awake for 30 minutes at a time, for at least the first 3-5 days, and then as needed for pain and swelling.  Continue to use ice for pain and swelling. You may notice swelling that will progress down to the foot and ankle.  This is normal after surgery.  Elevate your leg when you  are not up walking on it.   Continue to use the breathing machine you got in the hospital (incentive spirometer) which will help keep your  temperature down.  It is common for your temperature to cycle up and down following surgery, especially at night when you are not up moving around and exerting yourself.  The breathing machine keeps your lungs expanded and your temperature down.   DIET:  As you were doing prior to hospitalization, we recommend a well-balanced diet.  DRESSING / WOUND CARE / SHOWERING  You may shower 3 days after surgery, but keep the wounds dry during showering.  You may use an occlusive plastic wrap (Press'n Seal for example), NO SOAKING/SUBMERGING IN THE BATHTUB.  If the bandage gets wet, change with a clean dry gauze.  If the incision gets wet, pat the wound dry with a clean towel.  ACTIVITY  Increase activity slowly as tolerated, but follow the weight bearing instructions below.   No driving for 6 weeks or until further direction given by your physician.  You cannot drive while taking narcotics.  No lifting or carrying greater than 10 lbs. until further directed by your surgeon. Avoid periods of inactivity such as sitting longer than an hour when not asleep. This helps prevent blood clots.  You may return to work once you are authorized by your doctor.     WEIGHT BEARING   Weight bearing as tolerated with assist device (walker, cane, etc) as directed, use it as long as suggested by your surgeon or therapist, typically at least 4-6 weeks.   EXERCISES  Results after joint replacement surgery are often greatly improved when you follow the exercise, range of motion and muscle strengthening exercises prescribed by your doctor. Safety measures are also important to protect the joint from further injury. Any time any of these exercises cause you to have increased pain or swelling, decrease what you are doing until you are comfortable again and then slowly increase them. If you have problems or questions, call your caregiver or physical therapist for advice.   Rehabilitation is important following a joint  replacement. After just a few days of immobilization, the muscles of the leg can become weakened and shrink (atrophy).  These exercises are designed to build up the tone and strength of the thigh and leg muscles and to improve motion. Often times heat used for twenty to thirty minutes before working out will loosen up your tissues and help with improving the range of motion but do not use heat for the first two weeks following surgery (sometimes heat can increase post-operative swelling).   These exercises can be done on a training (exercise) mat, on the floor, on a table or on a bed. Use whatever works the best and is most comfortable for you.    Use music or television while you are exercising so that the exercises are a pleasant break in your day. This will make your life better with the exercises acting as a break in your routine that you can look forward to.   Perform all exercises about fifteen times, three times per day or as directed.  You should exercise both the operative leg and the other leg as well.   Exercises include:   Quad Sets - Tighten up the muscle on the front of the thigh (Quad) and hold for 5-10 seconds.   Straight Leg Raises - With your knee straight (if you were given a brace, keep it on), lift the leg to  60 degrees, hold for 3 seconds, and slowly lower the leg.  Perform this exercise against resistance later as your leg gets stronger.  Leg Slides: Lying on your back, slowly slide your foot toward your buttocks, bending your knee up off the floor (only go as far as is comfortable). Then slowly slide your foot back down until your leg is flat on the floor again.  Angel Wings: Lying on your back spread your legs to the side as far apart as you can without causing discomfort.  Hamstring Strength:  Lying on your back, push your heel against the floor with your leg straight by tightening up the muscles of your buttocks.  Repeat, but this time bend your knee to a comfortable angle, and  push your heel against the floor.  You may put a pillow under the heel to make it more comfortable if necessary.   A rehabilitation program following joint replacement surgery can speed recovery and prevent re-injury in the future due to weakened muscles. Contact your doctor or a physical therapist for more information on knee rehabilitation.    CONSTIPATION  Constipation is defined medically as fewer than three stools per week and severe constipation as less than one stool per week.  Even if you have a regular bowel pattern at home, your normal regimen is likely to be disrupted due to multiple reasons following surgery.  Combination of anesthesia, postoperative narcotics, change in appetite and fluid intake all can affect your bowels.   YOU MUST use at least one of the following options; they are listed in order of increasing strength to get the job done.  They are all available over the counter, and you may need to use some, POSSIBLY even all of these options:    Drink plenty of fluids (prune juice may be helpful) and high fiber foods Colace 100 mg by mouth twice a day  Senokot for constipation as directed and as needed Dulcolax (bisacodyl), take with full glass of water  Miralax (polyethylene glycol) once or twice a day as needed.  If you have tried all these things and are unable to have a bowel movement in the first 3-4 days after surgery call either your surgeon or your primary doctor.    If you experience loose stools or diarrhea, hold the medications until you stool forms back up.  If your symptoms do not get better within 1 week or if they get worse, check with your doctor.  If you experience "the worst abdominal pain ever" or develop nausea or vomiting, please contact the office immediately for further recommendations for treatment.   ITCHING:  If you experience itching with your medications, try taking only a single pain pill, or even half a pain pill at a time.  You can also use  Benadryl over the counter for itching or also to help with sleep.   TED HOSE STOCKINGS:  Use stockings on both legs until for at least 2 weeks or as directed by physician office. They may be removed at night for sleeping.  MEDICATIONS:  See your medication summary on the "After Visit Summary" that nursing will review with you.  You may have some home medications which will be placed on hold until you complete the course of blood thinner medication.  It is important for you to complete the blood thinner medication as prescribed.  PRECAUTIONS:  If you experience chest pain or shortness of breath - call 911 immediately for transfer to the hospital emergency department.  If you develop a fever greater that 101 F, purulent drainage from wound, increased redness or drainage from wound, foul odor from the wound/dressing, or calf pain - CONTACT YOUR SURGEON.                                                   FOLLOW-UP APPOINTMENTS:  If you do not already have a post-op appointment, please call the office for an appointment to be seen by your surgeon.  Guidelines for how soon to be seen are listed in your "After Visit Summary", but are typically between 1-4 weeks after surgery.  OTHER INSTRUCTIONS:   Knee Replacement:  Do not place pillow under knee, focus on keeping the knee straight while resting. CPM instructions: 0-90 degrees, 2 hours in the morning, 2 hours in the afternoon, and 2 hours in the evening. Place foam block, curve side up under heel at all times except when in CPM or when walking.  DO NOT modify, tear, cut, or change the foam block in any way.   DENTAL ANTIBIOTICS:  In most cases prophylactic antibiotics for Dental procdeures after total joint surgery are not necessary.  Exceptions are as follows:  1. History of prior total joint infection  2. Severely immunocompromised (Organ Transplant, cancer chemotherapy, Rheumatoid biologic meds such as [place])  3. Poorly controlled diabetes  (A1C &gt; 8.0, blood glucose over 200)  If you have one of these conditions, contact your surgeon for an antibiotic prescription, prior to your dental procedure.   MAKE SURE YOU:  Understand these instructions.  Get help right away if you are not doing well or get worse.    Thank you for letting us be a part of your medical care team.  It is a privilege we respect greatly.  We hope these instructions will help you stay on track for a fast and full recovery!   Increase activity slowly as tolerated   Complete by: As directed       Follow-up Information    Melrose Nakayama, MD. Go on 04/19/2020.   Specialty: Orthopedic Surgery Why: Your appointment is scheduled for 9:15.  Contact information: [place] [address] [phone]        [facility] Follow up.   Specialty: Home Health Services Why: HHPT will see you for 5 visits prior to staring outpatient physical therapy  Contact information: [place] [address] [phone]        [facility] Go on 04/23/2020.   Why: You are scheduled to start OPPT on the above date. The facility will call you with a time  Contact information: [phone]           Signed: Larwance Sachs Aradhana Gin 04/10/2020, 8:15 AM

## 2020-04-10 NOTE — Progress Notes (Signed)
Subjective: 1 Day Post-Op Procedure(s) (LRB): RIGH TOTAL HIP ARTHROPLASTY ANTERIOR APPROACH (Right)   Patient is doing well this morning and is hoping to go home.  Activity level:  wbat Diet tolerance:  ok Voiding:  ok Patient reports pain as mild.    Objective: Vital signs in last 24 hours: Temp:  [97.5 F (36.4 C)-98.2 F (36.8 C)] 97.7 F (36.5 C) (05/19 0504) Pulse Rate:  [73-105] 73 (05/19 0504) Resp:  [11-21] 18 (05/19 0504) BP: (112-136)/(66-80) 135/67 (05/19 0504) SpO2:  [94 %-100 %] 100 % (05/19 0504)  Labs: No results for input(s): HGB in the last 72 hours. No results for input(s): WBC, RBC, HCT, PLT in the last 72 hours. No results for input(s): NA, K, CL, CO2, BUN, CREATININE, GLUCOSE, CALCIUM in the last 72 hours. No results for input(s): LABPT, INR in the last 72 hours.  Physical Exam:  Neurologically intact ABD soft Neurovascular intact Sensation intact distally Intact pulses distally Dorsiflexion/Plantar flexion intact Incision: dressing C/D/I and no drainage No cellulitis present Compartment soft  Assessment/Plan:  1 Day Post-Op Procedure(s) (LRB): RIGH TOTAL HIP ARTHROPLASTY ANTERIOR APPROACH (Right) Advance diet Up with therapy D/C IV fluids Discharge home with home health today after PT. Continue on asa 81mg  BID x 4 weeks post op  Follow up in office 2 weeks post op.   Cathy [name] 04/10/2020, 8:10 AM

## 2020-04-10 NOTE — Plan of Care (Signed)
  Problem: Education: Goal: Knowledge of General Education information will improve Description: Including pain rating scale, medication(s)/side effects and non-pharmacologic comfort measures 04/10/2020 1625 by Hubert Azure, RN Outcome: Progressing 04/10/2020 1134 by Hubert Azure, RN Outcome: Progressing   Problem: Health Behavior/Discharge Planning: Goal: Ability to manage health-related needs will improve 04/10/2020 1625 by Hubert Azure, RN Outcome: Progressing 04/10/2020 1134 by Hubert Azure, RN Outcome: Progressing   Problem: Clinical Measurements: Goal: Ability to maintain clinical measurements within normal limits will improve Outcome: Progressing Goal: Will remain free from infection 04/10/2020 1625 by Hubert Azure, RN Outcome: Progressing 04/10/2020 1134 by Hubert Azure, RN Outcome: Progressing Goal: Diagnostic test results will improve 04/10/2020 1625 by Hubert Azure, RN Outcome: Progressing 04/10/2020 1134 by Hubert Azure, RN Outcome: Progressing Goal: Respiratory complications will improve 04/10/2020 1625 by Hubert Azure, RN Outcome: Progressing 04/10/2020 1134 by Hubert Azure, RN Outcome: Progressing Goal: Cardiovascular complication will be avoided Outcome: Progressing   Problem: Activity: Goal: Risk for activity intolerance will decrease Outcome: Progressing   Problem: Nutrition: Goal: Adequate nutrition will be maintained Outcome: Progressing   Problem: Coping: Goal: Level of anxiety will decrease Outcome: Progressing   Problem: Elimination: Goal: Will not experience complications related to bowel motility Outcome: Progressing Goal: Will not experience complications related to urinary retention Outcome: Progressing   Problem: Pain Managment: Goal: General experience of comfort will improve Outcome: Progressing   Problem: Safety: Goal: Ability to remain free from injury will improve Outcome: Progressing   Problem: Skin  Integrity: Goal: Risk for impaired skin integrity will decrease Outcome: Progressing   Problem: Education: Goal: Knowledge of the prescribed therapeutic regimen will improve Outcome: Progressing Goal: Understanding of discharge needs will improve Outcome: Progressing Goal: Individualized Educational Video(s) Outcome: Progressing   Problem: Activity: Goal: Ability to avoid complications of mobility impairment will improve Outcome: Progressing Goal: Ability to tolerate increased activity will improve Outcome: Progressing   Problem: Clinical Measurements: Goal: Postoperative complications will be avoided or minimized Outcome: Progressing   Problem: Pain Management: Goal: Pain level will decrease with appropriate interventions Outcome: Progressing   Problem: Skin Integrity: Goal: Will show signs of wound healing Outcome: Progressing

## 2020-04-13 DIAGNOSIS — R350 Frequency of micturition: Secondary | ICD-10-CM | POA: Diagnosis not present

## 2020-04-13 DIAGNOSIS — G5600 Carpal tunnel syndrome, unspecified upper limb: Secondary | ICD-10-CM | POA: Diagnosis not present

## 2020-04-13 DIAGNOSIS — N8110 Cystocele, unspecified: Secondary | ICD-10-CM | POA: Diagnosis not present

## 2020-04-13 DIAGNOSIS — M170 Bilateral primary osteoarthritis of knee: Secondary | ICD-10-CM | POA: Diagnosis not present

## 2020-04-13 DIAGNOSIS — E785 Hyperlipidemia, unspecified: Secondary | ICD-10-CM | POA: Diagnosis not present

## 2020-04-13 DIAGNOSIS — K4090 Unilateral inguinal hernia, without obstruction or gangrene, not specified as recurrent: Secondary | ICD-10-CM | POA: Diagnosis not present

## 2020-04-13 DIAGNOSIS — Z471 Aftercare following joint replacement surgery: Secondary | ICD-10-CM | POA: Diagnosis not present

## 2020-04-13 DIAGNOSIS — Z9181 History of falling: Secondary | ICD-10-CM | POA: Diagnosis not present

## 2020-04-13 DIAGNOSIS — Z87891 Personal history of nicotine dependence: Secondary | ICD-10-CM | POA: Diagnosis not present

## 2020-04-13 DIAGNOSIS — J309 Allergic rhinitis, unspecified: Secondary | ICD-10-CM | POA: Diagnosis not present

## 2020-04-13 DIAGNOSIS — M1612 Unilateral primary osteoarthritis, left hip: Secondary | ICD-10-CM | POA: Diagnosis not present

## 2020-04-13 DIAGNOSIS — I10 Essential (primary) hypertension: Secondary | ICD-10-CM | POA: Diagnosis not present

## 2020-04-13 DIAGNOSIS — Z96641 Presence of right artificial hip joint: Secondary | ICD-10-CM | POA: Diagnosis not present

## 2020-04-13 DIAGNOSIS — K2100 Gastro-esophageal reflux disease with esophagitis, without bleeding: Secondary | ICD-10-CM | POA: Diagnosis not present

## 2020-04-13 DIAGNOSIS — K5909 Other constipation: Secondary | ICD-10-CM | POA: Diagnosis not present

## 2020-04-13 DIAGNOSIS — M5441 Lumbago with sciatica, right side: Secondary | ICD-10-CM | POA: Diagnosis not present

## 2020-04-13 DIAGNOSIS — M25511 Pain in right shoulder: Secondary | ICD-10-CM | POA: Diagnosis not present

## 2020-04-13 DIAGNOSIS — E119 Type 2 diabetes mellitus without complications: Secondary | ICD-10-CM | POA: Diagnosis not present

## 2020-04-13 DIAGNOSIS — G8929 Other chronic pain: Secondary | ICD-10-CM | POA: Diagnosis not present

## 2020-04-15 DIAGNOSIS — M170 Bilateral primary osteoarthritis of knee: Secondary | ICD-10-CM | POA: Diagnosis not present

## 2020-04-15 DIAGNOSIS — M25511 Pain in right shoulder: Secondary | ICD-10-CM | POA: Diagnosis not present

## 2020-04-15 DIAGNOSIS — K5909 Other constipation: Secondary | ICD-10-CM | POA: Diagnosis not present

## 2020-04-15 DIAGNOSIS — Z87891 Personal history of nicotine dependence: Secondary | ICD-10-CM | POA: Diagnosis not present

## 2020-04-15 DIAGNOSIS — I10 Essential (primary) hypertension: Secondary | ICD-10-CM | POA: Diagnosis not present

## 2020-04-15 DIAGNOSIS — Z471 Aftercare following joint replacement surgery: Secondary | ICD-10-CM | POA: Diagnosis not present

## 2020-04-15 DIAGNOSIS — J309 Allergic rhinitis, unspecified: Secondary | ICD-10-CM | POA: Diagnosis not present

## 2020-04-15 DIAGNOSIS — Z9181 History of falling: Secondary | ICD-10-CM | POA: Diagnosis not present

## 2020-04-15 DIAGNOSIS — K2100 Gastro-esophageal reflux disease with esophagitis, without bleeding: Secondary | ICD-10-CM | POA: Diagnosis not present

## 2020-04-15 DIAGNOSIS — G5600 Carpal tunnel syndrome, unspecified upper limb: Secondary | ICD-10-CM | POA: Diagnosis not present

## 2020-04-15 DIAGNOSIS — M1612 Unilateral primary osteoarthritis, left hip: Secondary | ICD-10-CM | POA: Diagnosis not present

## 2020-04-15 DIAGNOSIS — G8929 Other chronic pain: Secondary | ICD-10-CM | POA: Diagnosis not present

## 2020-04-15 DIAGNOSIS — R350 Frequency of micturition: Secondary | ICD-10-CM | POA: Diagnosis not present

## 2020-04-15 DIAGNOSIS — N8110 Cystocele, unspecified: Secondary | ICD-10-CM | POA: Diagnosis not present

## 2020-04-15 DIAGNOSIS — M5441 Lumbago with sciatica, right side: Secondary | ICD-10-CM | POA: Diagnosis not present

## 2020-04-15 DIAGNOSIS — E785 Hyperlipidemia, unspecified: Secondary | ICD-10-CM | POA: Diagnosis not present

## 2020-04-15 DIAGNOSIS — Z96641 Presence of right artificial hip joint: Secondary | ICD-10-CM | POA: Diagnosis not present

## 2020-04-15 DIAGNOSIS — K4090 Unilateral inguinal hernia, without obstruction or gangrene, not specified as recurrent: Secondary | ICD-10-CM | POA: Diagnosis not present

## 2020-04-15 DIAGNOSIS — E119 Type 2 diabetes mellitus without complications: Secondary | ICD-10-CM | POA: Diagnosis not present

## 2020-04-17 DIAGNOSIS — M1612 Unilateral primary osteoarthritis, left hip: Secondary | ICD-10-CM | POA: Diagnosis not present

## 2020-04-17 DIAGNOSIS — Z96641 Presence of right artificial hip joint: Secondary | ICD-10-CM | POA: Diagnosis not present

## 2020-04-17 DIAGNOSIS — G8929 Other chronic pain: Secondary | ICD-10-CM | POA: Diagnosis not present

## 2020-04-17 DIAGNOSIS — J309 Allergic rhinitis, unspecified: Secondary | ICD-10-CM | POA: Diagnosis not present

## 2020-04-17 DIAGNOSIS — Z9181 History of falling: Secondary | ICD-10-CM | POA: Diagnosis not present

## 2020-04-17 DIAGNOSIS — I10 Essential (primary) hypertension: Secondary | ICD-10-CM | POA: Diagnosis not present

## 2020-04-17 DIAGNOSIS — E119 Type 2 diabetes mellitus without complications: Secondary | ICD-10-CM | POA: Diagnosis not present

## 2020-04-17 DIAGNOSIS — M5441 Lumbago with sciatica, right side: Secondary | ICD-10-CM | POA: Diagnosis not present

## 2020-04-17 DIAGNOSIS — K4090 Unilateral inguinal hernia, without obstruction or gangrene, not specified as recurrent: Secondary | ICD-10-CM | POA: Diagnosis not present

## 2020-04-17 DIAGNOSIS — R350 Frequency of micturition: Secondary | ICD-10-CM | POA: Diagnosis not present

## 2020-04-17 DIAGNOSIS — K2100 Gastro-esophageal reflux disease with esophagitis, without bleeding: Secondary | ICD-10-CM | POA: Diagnosis not present

## 2020-04-17 DIAGNOSIS — E785 Hyperlipidemia, unspecified: Secondary | ICD-10-CM | POA: Diagnosis not present

## 2020-04-17 DIAGNOSIS — M170 Bilateral primary osteoarthritis of knee: Secondary | ICD-10-CM | POA: Diagnosis not present

## 2020-04-17 DIAGNOSIS — K5909 Other constipation: Secondary | ICD-10-CM | POA: Diagnosis not present

## 2020-04-17 DIAGNOSIS — M25511 Pain in right shoulder: Secondary | ICD-10-CM | POA: Diagnosis not present

## 2020-04-17 DIAGNOSIS — N8110 Cystocele, unspecified: Secondary | ICD-10-CM | POA: Diagnosis not present

## 2020-04-17 DIAGNOSIS — G5600 Carpal tunnel syndrome, unspecified upper limb: Secondary | ICD-10-CM | POA: Diagnosis not present

## 2020-04-17 DIAGNOSIS — Z87891 Personal history of nicotine dependence: Secondary | ICD-10-CM | POA: Diagnosis not present

## 2020-04-17 DIAGNOSIS — Z471 Aftercare following joint replacement surgery: Secondary | ICD-10-CM | POA: Diagnosis not present

## 2020-04-18 DIAGNOSIS — K5909 Other constipation: Secondary | ICD-10-CM | POA: Diagnosis not present

## 2020-04-18 DIAGNOSIS — G8929 Other chronic pain: Secondary | ICD-10-CM | POA: Diagnosis not present

## 2020-04-18 DIAGNOSIS — Z9181 History of falling: Secondary | ICD-10-CM | POA: Diagnosis not present

## 2020-04-18 DIAGNOSIS — M170 Bilateral primary osteoarthritis of knee: Secondary | ICD-10-CM | POA: Diagnosis not present

## 2020-04-18 DIAGNOSIS — G5600 Carpal tunnel syndrome, unspecified upper limb: Secondary | ICD-10-CM | POA: Diagnosis not present

## 2020-04-18 DIAGNOSIS — M5441 Lumbago with sciatica, right side: Secondary | ICD-10-CM | POA: Diagnosis not present

## 2020-04-18 DIAGNOSIS — Z471 Aftercare following joint replacement surgery: Secondary | ICD-10-CM | POA: Diagnosis not present

## 2020-04-18 DIAGNOSIS — K2100 Gastro-esophageal reflux disease with esophagitis, without bleeding: Secondary | ICD-10-CM | POA: Diagnosis not present

## 2020-04-18 DIAGNOSIS — Z87891 Personal history of nicotine dependence: Secondary | ICD-10-CM | POA: Diagnosis not present

## 2020-04-18 DIAGNOSIS — M1612 Unilateral primary osteoarthritis, left hip: Secondary | ICD-10-CM | POA: Diagnosis not present

## 2020-04-18 DIAGNOSIS — Z96641 Presence of right artificial hip joint: Secondary | ICD-10-CM | POA: Diagnosis not present

## 2020-04-18 DIAGNOSIS — R350 Frequency of micturition: Secondary | ICD-10-CM | POA: Diagnosis not present

## 2020-04-18 DIAGNOSIS — K4090 Unilateral inguinal hernia, without obstruction or gangrene, not specified as recurrent: Secondary | ICD-10-CM | POA: Diagnosis not present

## 2020-04-18 DIAGNOSIS — I10 Essential (primary) hypertension: Secondary | ICD-10-CM | POA: Diagnosis not present

## 2020-04-18 DIAGNOSIS — E119 Type 2 diabetes mellitus without complications: Secondary | ICD-10-CM | POA: Diagnosis not present

## 2020-04-18 DIAGNOSIS — N8110 Cystocele, unspecified: Secondary | ICD-10-CM | POA: Diagnosis not present

## 2020-04-18 DIAGNOSIS — M25511 Pain in right shoulder: Secondary | ICD-10-CM | POA: Diagnosis not present

## 2020-04-18 DIAGNOSIS — E785 Hyperlipidemia, unspecified: Secondary | ICD-10-CM | POA: Diagnosis not present

## 2020-04-18 DIAGNOSIS — J309 Allergic rhinitis, unspecified: Secondary | ICD-10-CM | POA: Diagnosis not present

## 2020-04-19 DIAGNOSIS — Z96641 Presence of right artificial hip joint: Secondary | ICD-10-CM | POA: Diagnosis not present

## 2020-04-22 DIAGNOSIS — M1612 Unilateral primary osteoarthritis, left hip: Secondary | ICD-10-CM | POA: Diagnosis not present

## 2020-04-22 DIAGNOSIS — I10 Essential (primary) hypertension: Secondary | ICD-10-CM | POA: Diagnosis not present

## 2020-04-22 DIAGNOSIS — Z87891 Personal history of nicotine dependence: Secondary | ICD-10-CM | POA: Diagnosis not present

## 2020-04-22 DIAGNOSIS — G8929 Other chronic pain: Secondary | ICD-10-CM | POA: Diagnosis not present

## 2020-04-22 DIAGNOSIS — E785 Hyperlipidemia, unspecified: Secondary | ICD-10-CM | POA: Diagnosis not present

## 2020-04-22 DIAGNOSIS — N8110 Cystocele, unspecified: Secondary | ICD-10-CM | POA: Diagnosis not present

## 2020-04-22 DIAGNOSIS — R350 Frequency of micturition: Secondary | ICD-10-CM | POA: Diagnosis not present

## 2020-04-22 DIAGNOSIS — M5441 Lumbago with sciatica, right side: Secondary | ICD-10-CM | POA: Diagnosis not present

## 2020-04-22 DIAGNOSIS — K2100 Gastro-esophageal reflux disease with esophagitis, without bleeding: Secondary | ICD-10-CM | POA: Diagnosis not present

## 2020-04-22 DIAGNOSIS — Z9181 History of falling: Secondary | ICD-10-CM | POA: Diagnosis not present

## 2020-04-22 DIAGNOSIS — K4090 Unilateral inguinal hernia, without obstruction or gangrene, not specified as recurrent: Secondary | ICD-10-CM | POA: Diagnosis not present

## 2020-04-22 DIAGNOSIS — M25511 Pain in right shoulder: Secondary | ICD-10-CM | POA: Diagnosis not present

## 2020-04-22 DIAGNOSIS — Z471 Aftercare following joint replacement surgery: Secondary | ICD-10-CM | POA: Diagnosis not present

## 2020-04-22 DIAGNOSIS — G5600 Carpal tunnel syndrome, unspecified upper limb: Secondary | ICD-10-CM | POA: Diagnosis not present

## 2020-04-22 DIAGNOSIS — Z96641 Presence of right artificial hip joint: Secondary | ICD-10-CM | POA: Diagnosis not present

## 2020-04-22 DIAGNOSIS — K5909 Other constipation: Secondary | ICD-10-CM | POA: Diagnosis not present

## 2020-04-22 DIAGNOSIS — J309 Allergic rhinitis, unspecified: Secondary | ICD-10-CM | POA: Diagnosis not present

## 2020-04-22 DIAGNOSIS — E119 Type 2 diabetes mellitus without complications: Secondary | ICD-10-CM | POA: Diagnosis not present

## 2020-04-22 DIAGNOSIS — M170 Bilateral primary osteoarthritis of knee: Secondary | ICD-10-CM | POA: Diagnosis not present

## 2020-04-24 DIAGNOSIS — R262 Difficulty in walking, not elsewhere classified: Secondary | ICD-10-CM | POA: Diagnosis not present

## 2020-04-24 DIAGNOSIS — Z96641 Presence of right artificial hip joint: Secondary | ICD-10-CM | POA: Diagnosis not present

## 2020-04-25 DIAGNOSIS — R262 Difficulty in walking, not elsewhere classified: Secondary | ICD-10-CM | POA: Diagnosis not present

## 2020-04-25 DIAGNOSIS — Z96641 Presence of right artificial hip joint: Secondary | ICD-10-CM | POA: Diagnosis not present

## 2020-04-29 DIAGNOSIS — Z96641 Presence of right artificial hip joint: Secondary | ICD-10-CM | POA: Diagnosis not present

## 2020-04-29 DIAGNOSIS — R262 Difficulty in walking, not elsewhere classified: Secondary | ICD-10-CM | POA: Diagnosis not present

## 2020-05-01 DIAGNOSIS — R262 Difficulty in walking, not elsewhere classified: Secondary | ICD-10-CM | POA: Diagnosis not present

## 2020-05-01 DIAGNOSIS — Z9889 Other specified postprocedural states: Secondary | ICD-10-CM | POA: Diagnosis not present

## 2020-05-01 DIAGNOSIS — Z96641 Presence of right artificial hip joint: Secondary | ICD-10-CM | POA: Diagnosis not present

## 2020-05-02 ENCOUNTER — Other Ambulatory Visit: Payer: Self-pay

## 2020-05-02 ENCOUNTER — Ambulatory Visit
Admission: RE | Admit: 2020-05-02 | Discharge: 2020-05-02 | Disposition: A | Discharge status: Home / Self Care | Payer: Medicare Other | Source: Ambulatory Visit | Attending: Adult Health | Admitting: Adult Health

## 2020-05-02 DIAGNOSIS — Z1231 Encounter for screening mammogram for malignant neoplasm of breast: Secondary | ICD-10-CM | POA: Diagnosis not present

## 2020-05-05 ENCOUNTER — Other Ambulatory Visit: Payer: Self-pay | Admitting: Family Medicine

## 2020-05-06 DIAGNOSIS — Z96641 Presence of right artificial hip joint: Secondary | ICD-10-CM | POA: Diagnosis not present

## 2020-05-06 DIAGNOSIS — R262 Difficulty in walking, not elsewhere classified: Secondary | ICD-10-CM | POA: Diagnosis not present

## 2020-05-08 DIAGNOSIS — R262 Difficulty in walking, not elsewhere classified: Secondary | ICD-10-CM | POA: Diagnosis not present

## 2020-05-08 DIAGNOSIS — Z96641 Presence of right artificial hip joint: Secondary | ICD-10-CM | POA: Diagnosis not present

## 2020-05-13 ENCOUNTER — Ambulatory Visit (INDEPENDENT_AMBULATORY_CARE_PROVIDER_SITE_OTHER): Payer: Medicare Other | Admitting: Family Medicine

## 2020-05-13 ENCOUNTER — Encounter: Payer: Self-pay | Admitting: Family Medicine

## 2020-05-13 ENCOUNTER — Other Ambulatory Visit: Payer: Self-pay

## 2020-05-13 ENCOUNTER — Ambulatory Visit: Payer: Self-pay

## 2020-05-13 VITALS — BP 140/80 | HR 87 | Ht 64.0 in | Wt 141.0 lb

## 2020-05-13 DIAGNOSIS — Z96641 Presence of right artificial hip joint: Secondary | ICD-10-CM | POA: Diagnosis not present

## 2020-05-13 DIAGNOSIS — M25552 Pain in left hip: Secondary | ICD-10-CM

## 2020-05-13 DIAGNOSIS — G8929 Other chronic pain: Secondary | ICD-10-CM

## 2020-05-13 DIAGNOSIS — M25562 Pain in left knee: Secondary | ICD-10-CM

## 2020-05-13 DIAGNOSIS — R262 Difficulty in walking, not elsewhere classified: Secondary | ICD-10-CM | POA: Diagnosis not present

## 2020-05-13 NOTE — Progress Notes (Signed)
   I, Wendy Poet, LAT, ATC, am serving as scribe for Dr. Lynne Leader.  Cathy [name] is a 74 y.o. female who presents to [facility] today for f/u of L knee and L hip pain.  She was last seen by Dr. Tamala Julian on 02/27/20 for B knee pain and R hip pain.  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   Pertinent review of systems: No fevers or chills  Relevant historical information: Right total hip replacement 1 month ago   Exam:  BP 140/80 (BP Location: Right Arm, Patient Position: Sitting, Cuff Size: Normal)   Pulse 87   Ht 5\' 4"  (1.626 m)   Wt 141 lb (64 kg)   SpO2 98%   BMI 24.20 kg/m  General: Well Developed, well nourished, and in no acute distress.   MSK: Left knee mild effusion Nontender. Normal motion with crepitation. Stable ligamentous exam.  Left hip normal-appearing normal motion mildly tender palpation left greater trochanter region.    Lab and Radiology Results  Procedure: Real-time Ultrasound Guided Injection of left knee lateral suprapatellar space Device: Philips Affiniti 50G Images permanently stored and available for review in the ultrasound unit. Verbal informed consent obtained.  Discussed risks and benefits of procedure. Warned about infection bleeding damage to structures skin hypopigmentation and fat atrophy among others. Patient expresses understanding and agreement Time-out conducted.   Noted no overlying erythema, induration, or other signs of local infection.   Skin prepped in a sterile fashion.   Local anesthesia: Topical Ethyl chloride.   With sterile technique and under real time ultrasound guidance:  Durolane injected easily.   Completed without difficulty     Advised to call if fevers/chills, erythema, induration, drainage, or persistent bleeding.   Images permanently stored and available for review in the ultrasound unit.  Impression: Technically successful ultrasound guided  injection.   Lot #[id number]      Assessment and Plan: 74 y.o. female with left knee pain.  Patient had right total hip replacement a month ago and had increased weightbearing on left knee.  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  Plan for Durolane injection today and recheck back as needed.  Left hip pain: More likely to be trochanteric bursitis based on location of pain.  Symptoms are relatively mild.  Advised that steroid injection within 1 month of a total hip replacement is not ideal as it might impair wound or bone healing.  She has her symptoms are not bad enough to risk it then.  Recommend advancing physical therapy to include left hip and recheck back as needed.  She is receiving physical therapy as part of her total hip replacement.   PDMP not reviewed this encounter. Orders Placed This Encounter  Procedures  . [place] LIMITED JOINT SPACE STRUCTURES LOW LEFT(NO LINKED CHARGES)    Order Specific Question:   Reason for Exam (SYMPTOM  OR DIAGNOSIS REQUIRED)    Answer:   L knee pain    Order Specific Question:   Preferred imaging location?    Answer:   [place]   No orders of the defined types were placed in this encounter.    Discussed warning signs or symptoms. Please see discharge instructions. Patient expresses understanding.   The above documentation has been reviewed and is accurate and complete Lynne Leader, M.D.

## 2020-05-13 NOTE — Patient Instructions (Signed)
You had a L knee injection today.  Call or go to the ER if you develop a large red swollen joint with extreme pain or oozing puss.    

## 2020-05-15 DIAGNOSIS — Z96641 Presence of right artificial hip joint: Secondary | ICD-10-CM | POA: Diagnosis not present

## 2020-05-15 DIAGNOSIS — R262 Difficulty in walking, not elsewhere classified: Secondary | ICD-10-CM | POA: Diagnosis not present

## 2020-05-20 DIAGNOSIS — Z96641 Presence of right artificial hip joint: Secondary | ICD-10-CM | POA: Diagnosis not present

## 2020-05-20 DIAGNOSIS — R262 Difficulty in walking, not elsewhere classified: Secondary | ICD-10-CM | POA: Diagnosis not present

## 2020-05-21 ENCOUNTER — Telehealth: Payer: Self-pay | Admitting: Family Medicine

## 2020-05-21 NOTE — Telephone Encounter (Signed)
Patient scheduled in July for injection.

## 2020-05-21 NOTE — Telephone Encounter (Signed)
Patient forgot to ask you another question.. can you call her back please?

## 2020-05-21 NOTE — Telephone Encounter (Signed)
Patient called with a few questions after her recent gel injections.  She asked how long it should take for her to feel better and how long she would need to wait before having one in her hip.

## 2020-05-21 NOTE — Telephone Encounter (Signed)
Spoke with patient about wearing thigh compression sleeve for her upper leg pain. Patient has brace from last year.

## 2020-05-22 DIAGNOSIS — R262 Difficulty in walking, not elsewhere classified: Secondary | ICD-10-CM | POA: Diagnosis not present

## 2020-05-22 DIAGNOSIS — Z96641 Presence of right artificial hip joint: Secondary | ICD-10-CM | POA: Diagnosis not present

## 2020-05-23 ENCOUNTER — Telehealth: Payer: Self-pay | Admitting: Adult Health

## 2020-05-23 NOTE — Telephone Encounter (Signed)
She is talking about prednisone. I would need to see her before this is prescribed

## 2020-05-23 NOTE — Telephone Encounter (Signed)
Cathy Rumps, do you know what medication?

## 2020-05-23 NOTE — Telephone Encounter (Signed)
Pt is requesting a refill on a medication for her hand numbness and tingling. Pt does not know the name of the medication. Pt contacted the pharmacy and had no luck getting help with the name of the medication.

## 2020-05-24 NOTE — Telephone Encounter (Signed)
Spoke to pt and refused to schedule an appt. Pt just had a hip replacement and will call back to schedule an appt.

## 2020-05-28 ENCOUNTER — Ambulatory Visit: Payer: Medicare Other | Admitting: Family Medicine

## 2020-05-28 DIAGNOSIS — Z96641 Presence of right artificial hip joint: Secondary | ICD-10-CM | POA: Diagnosis not present

## 2020-05-28 DIAGNOSIS — R262 Difficulty in walking, not elsewhere classified: Secondary | ICD-10-CM | POA: Diagnosis not present

## 2020-05-29 ENCOUNTER — Telehealth (INDEPENDENT_AMBULATORY_CARE_PROVIDER_SITE_OTHER): Payer: Medicare Other | Admitting: Adult Health

## 2020-05-29 ENCOUNTER — Encounter: Payer: Self-pay | Admitting: Adult Health

## 2020-05-29 ENCOUNTER — Other Ambulatory Visit: Payer: Self-pay

## 2020-05-29 VITALS — Temp 97.8°F | Wt 139.0 lb

## 2020-05-29 DIAGNOSIS — M5412 Radiculopathy, cervical region: Secondary | ICD-10-CM | POA: Diagnosis not present

## 2020-05-29 MED ORDER — METHYLPREDNISOLONE 4 MG PO TBPK: ORAL_TABLET | ORAL | 0 refills | Status: DC

## 2020-05-29 NOTE — Progress Notes (Signed)
Virtual Visit via Telephone Note  I connected with [name] on 05/29/20 at  8:00 AM EDT by telephone and verified that I am speaking with the correct person using two identifiers.   I discussed the limitations, risks, security and privacy concerns of performing an evaluation and management service by telephone and the availability of in person appointments. I also discussed with the patient that there may be a patient responsible charge related to this service. The patient expressed understanding and agreed to proceed.  Location patient: home Location provider: work or home office Participants present for the call: patient, provider Patient did not have a visit in the prior 7 days to address this/these issue(s).   History of Present Illness: She is being evaluated today for an acute on chronic issue of numbness and tingling in fingers bilaterally, worse on the left side.  Her symptoms present in all digits and are intermittent over the last week.  She has noticed that as the week has gone on that her symptoms have become more painful and seem to be worse in the evening and early morning.  She does deny any pain in her shoulders or numbness or tingling in the arms.  She has not lost any grip strength or range of motion.  She is 6 weeks postop from right hip replacement and reports that she is doing well.  Last x-ray of the cervical spine in October 2019 showed diffuse cervical spine spondylosis.  The past she has been prescribed a Medrol Dosepak and has responded well to that.  He refuses MRI or neurosurgery consult   Observations/Objective: Patient sounds cheerful and well on the phone. I do not appreciate any SOB. Speech and thought processing are grossly intact. Patient reported vitals:  Assessment and Plan: 1. Cervical radiculopathy -Being that she is 6 weeks postop she should be fine for a Medrol Dosepak and this should not inhibit healing.  She was advised to follow-up if no  improvement after the Medrol Dosepak is complete - methylPREDNISolone (MEDROL DOSEPAK) 4 MG TBPK tablet; Take as directed  Dispense: 21 tablet; Refill: 0   Follow Up Instructions:  I did not refer this patient for an OV in the next 24 hours for this/these issue(s).  I discussed the assessment and treatment plan with the patient. The patient was provided an opportunity to ask questions and all were answered. The patient agreed with the plan and demonstrated an understanding of the instructions.   The patient was advised to call back or seek an in-person evaluation if the symptoms worsen or if the condition fails to improve as anticipated.  I provided 15 minutes of non-face-to-face time during this encounter.   [name], NP

## 2020-06-03 ENCOUNTER — Telehealth: Payer: Self-pay | Admitting: *Deleted

## 2020-06-03 DIAGNOSIS — Z0100 Encounter for examination of eyes and vision without abnormal findings: Secondary | ICD-10-CM | POA: Diagnosis not present

## 2020-06-03 NOTE — Telephone Encounter (Signed)
Patient called after hours line this morning. Patient reports that her steroid medication for her hand isn't helping. Declined triage.

## 2020-06-04 ENCOUNTER — Telehealth: Payer: Self-pay | Admitting: Adult Health

## 2020-06-04 NOTE — Telephone Encounter (Signed)
Last low-dosage steroid that pcp gave to patient isn't working as good as the first steroid she got from him.  She is wanting to know what she can do.  She did not know the name of the last medication given.

## 2020-06-04 NOTE — Telephone Encounter (Signed)
Left a message for pt. Please read the messages below.

## 2020-06-04 NOTE — Telephone Encounter (Signed)
She can follow up in the office if she feels as though she needs to be seen

## 2020-06-05 NOTE — Telephone Encounter (Signed)
LVM for patient to call to make an appt to see [facility].

## 2020-06-10 ENCOUNTER — Ambulatory Visit (INDEPENDENT_AMBULATORY_CARE_PROVIDER_SITE_OTHER): Payer: Medicare Other

## 2020-06-10 ENCOUNTER — Other Ambulatory Visit: Payer: Self-pay

## 2020-06-10 DIAGNOSIS — Z0000 Encounter for general adult medical examination without abnormal findings: Secondary | ICD-10-CM | POA: Diagnosis not present

## 2020-06-10 NOTE — Patient Instructions (Signed)
Ms. Cathy [name] , Thank you for taking time to come for your Medicare Wellness Visit. I appreciate your ongoing commitment to your health goals. Please review the following plan we discussed and let me know if I can assist you in the future.   Screening recommendations/referrals: Colonoscopy: Up to date, next due 06/17/2025 Mammogram: Up to date, next due 05/02/2020 Bone Density: Up to date, next due 08/03/2019 Recommended yearly ophthalmology/optometry visit for glaucoma screening and checkup Recommended yearly dental visit for hygiene and checkup  Vaccinations: Influenza vaccine: Up to date, next due 06/2020 Pneumococcal vaccine: Complete series Tdap vaccine: Currently due, may check on cost with your insurance or await injury so it will be covered  Shingles vaccine: Completed series    Advanced directives: Advance directive discussed with you today. Even though you declined this today please call our office should you change your mind and we can give you the proper paperwork for you to fill out.   Conditions/risks identified: None   Next appointment: 06/12/2020 @ 2:30 PM with [name], NP   Preventive Care 72 Years and Older, Female Preventive care refers to lifestyle choices and visits with your health care provider that can promote health and wellness. What does preventive care include?  A yearly physical exam. This is also called an annual well check.  Dental exams once or twice a year.  Routine eye exams. Ask your health care provider how often you should have your eyes checked.  Personal lifestyle choices, including:  Daily care of your teeth and gums.  Regular physical activity.  Eating a healthy diet.  Avoiding tobacco and drug use.  Limiting alcohol use.  Practicing safe sex.  Taking low-dose aspirin every day.  Taking vitamin and mineral supplements as recommended by your health care provider. What happens during an annual well check? The services and  screenings done by your health care provider during your annual well check will depend on your age, overall health, lifestyle risk factors, and family history of disease. Counseling  Your health care provider may ask you questions about your:  Alcohol use.  Tobacco use.  Drug use.  Emotional well-being.  Home and relationship well-being.  Sexual activity.  Eating habits.  History of falls.  Memory and ability to understand (cognition).  Work and work Statistician.  Reproductive health. Screening  You may have the following tests or measurements:  Height, weight, and BMI.  Blood pressure.  Lipid and cholesterol levels. These may be checked every 5 years, or more frequently if you are over 40 years old.  Skin check.  Lung cancer screening. You may have this screening every year starting at age 22 if you have a 30-pack-year history of smoking and currently smoke or have quit within the past 15 years.  Fecal occult blood test (FOBT) of the stool. You may have this test every year starting at age 64.  Flexible sigmoidoscopy or colonoscopy. You may have a sigmoidoscopy every 5 years or a colonoscopy every 10 years starting at age 8.  Hepatitis C blood test.  Hepatitis B blood test.  Sexually transmitted disease (STD) testing.  Diabetes screening. This is done by checking your blood sugar (glucose) after you have not eaten for a while (fasting). You may have this done every 1-3 years.  Bone density scan. This is done to screen for osteoporosis. You may have this done starting at age 20.  Mammogram. This may be done every 1-2 years. Talk to your health care provider about how  often you should have regular mammograms. Talk with your health care provider about your test results, treatment options, and if necessary, the need for more tests. Vaccines  Your health care provider may recommend certain vaccines, such as:  Influenza vaccine. This is recommended every  year.  Tetanus, diphtheria, and acellular pertussis (Tdap, Td) vaccine. You may need a Td booster every 10 years.  Zoster vaccine. You may need this after age 3.  Pneumococcal 13-valent conjugate (PCV13) vaccine. One dose is recommended after age 64.  Pneumococcal polysaccharide (PPSV23) vaccine. One dose is recommended after age 89. Talk to your health care provider about which screenings and vaccines you need and how often you need them. This information is not intended to replace advice given to you by your health care provider. Make sure you discuss any questions you have with your health care provider. Document Released: 12/06/2015 Document Revised: 07/29/2016 Document Reviewed: 09/10/2015 Elsevier Interactive Patient Education  2017 [place] Prevention in the Home Falls can cause injuries. They can happen to people of all ages. There are many things you can do to make your home safe and to help prevent falls. What can I do on the outside of my home?  Regularly fix the edges of walkways and driveways and fix any cracks.  Remove anything that might make you trip as you walk through a door, such as a raised step or threshold.  Trim any bushes or trees on the path to your home.  Use bright outdoor lighting.  Clear any walking paths of anything that might make someone trip, such as rocks or tools.  Regularly check to see if handrails are loose or broken. Make sure that both sides of any steps have handrails.  Any raised decks and porches should have guardrails on the edges.  Have any leaves, snow, or ice cleared regularly.  Use sand or salt on walking paths during winter.  Clean up any spills in your garage right away. This includes oil or grease spills. What can I do in the bathroom?  Use night lights.  Install grab bars by the toilet and in the tub and shower. Do not use towel bars as grab bars.  Use non-skid mats or decals in the tub or shower.  If you  need to sit down in the shower, use a plastic, non-slip stool.  Keep the floor dry. Clean up any water that spills on the floor as soon as it happens.  Remove soap buildup in the tub or shower regularly.  Attach bath mats securely with double-sided non-slip rug tape.  Do not have throw rugs and other things on the floor that can make you trip. What can I do in the bedroom?  Use night lights.  Make sure that you have a light by your bed that is easy to reach.  Do not use any sheets or blankets that are too big for your bed. They should not hang down onto the floor.  Have a firm chair that has side arms. You can use this for support while you get dressed.  Do not have throw rugs and other things on the floor that can make you trip. What can I do in the kitchen?  Clean up any spills right away.  Avoid walking on wet floors.  Keep items that you use a lot in easy-to-reach places.  If you need to reach something above you, use a strong step stool that has a grab bar.  Keep electrical  cords out of the way.  Do not use floor polish or wax that makes floors slippery. If you must use wax, use non-skid floor wax.  Do not have throw rugs and other things on the floor that can make you trip. What can I do with my stairs?  Do not leave any items on the stairs.  Make sure that there are handrails on both sides of the stairs and use them. Fix handrails that are broken or loose. Make sure that handrails are as long as the stairways.  Check any carpeting to make sure that it is firmly attached to the stairs. Fix any carpet that is loose or worn.  Avoid having throw rugs at the top or bottom of the stairs. If you do have throw rugs, attach them to the floor with carpet tape.  Make sure that you have a light switch at the top of the stairs and the bottom of the stairs. If you do not have them, ask someone to add them for you. What else can I do to help prevent falls?  Wear shoes  that:  Do not have high heels.  Have rubber bottoms.  Are comfortable and fit you well.  Are closed at the toe. Do not wear sandals.  If you use a stepladder:  Make sure that it is fully opened. Do not climb a closed stepladder.  Make sure that both sides of the stepladder are locked into place.  Ask someone to hold it for you, if possible.  Clearly mark and make sure that you can see:  Any grab bars or handrails.  First and last steps.  Where the edge of each step is.  Use tools that help you move around (mobility aids) if they are needed. These include:  Canes.  Walkers.  Scooters.  Crutches.  Turn on the lights when you go into a dark area. Replace any light bulbs as soon as they burn out.  Set up your furniture so you have a clear path. Avoid moving your furniture around.  If any of your floors are uneven, fix them.  If there are any pets around you, be aware of where they are.  Review your medicines with your doctor. Some medicines can make you feel dizzy. This can increase your chance of falling. Ask your doctor what other things that you can do to help prevent falls. This information is not intended to replace advice given to you by your health care provider. Make sure you discuss any questions you have with your health care provider. Document Released: 09/05/2009 Document Revised: 04/16/2016 Document Reviewed: 12/14/2014 Elsevier Interactive Patient Education  2017 [employer].

## 2020-06-10 NOTE — Progress Notes (Addendum)
Subjective:   Cathy [name] is a 74 y.o. female who presents for Medicare Annual (Subsequent) preventive examination.  I connected with Lianne Bushy today by telephone and verified that I am speaking with the correct person using two identifiers. Location patient: home Location provider: work Persons participating in the virtual visit: patient, provider.   I discussed the limitations, risks, security and privacy concerns of performing an evaluation and management service by telephone and the availability of in person appointments. I also discussed with the patient that there may be a patient responsible charge related to this service. The patient expressed understanding and verbally consented to this telephonic visit.    Interactive audio and video telecommunications were attempted between this provider and patient, however failed, due to patient having technical difficulties OR patient did not have access to video capability.  We continued and completed visit with audio only.     Review of Systems    N/A Cardiac Risk Factors include: advanced age (>50mn, >>4women);diabetes mellitus;dyslipidemia;hypertension     Objective:    Today's Vitals   06/10/20 0940  PainSc: 5    There is no height or weight on file to calculate BMI.  Advanced Directives 06/10/2020 04/09/2020 04/09/2020 04/02/2020 12/02/2017 06/07/2016 03/02/2016  Does Patient Have a Medical Advance Directive? _0  No No  Type of Advance Directive - - - - - - -  Does patient want to make changes to medical advance directive? - - - - - - -  Copy of [place]ottin Chart? - - - - - - -  Would patient like information on creating a medical advance directive? No - Patient declined No - Patient declined No - Patient declined - - - No - patient declined information    Current Medications (verified) Outpatient Encounter Medications as of 06/10/2020  Medication Sig   Ascorbic Acid (VITAMIN C) 1000  MG tablet Take 1,000 mg by mouth daily.   aspirin 81 MG tablet Take 1 tablet (81 mg total) by mouth 2 (two) times daily after a meal.   b complex vitamins capsule Take 1 capsule by mouth daily.   Biotin 1000 MCG tablet Take 1,000 mcg by mouth daily.   blood glucose meter kit and supplies KIT Dispense based on patient and insurance preference. Use once daily to test blood glucose. (FOR ICD-E11.9).   Calcium Carbonate-Vitamin D (CALTRATE 600+D) 600-400 MG-UNIT per tablet Take 1 tablet by mouth daily.     Cinnamon 500 MG capsule Take 1,000 mg by mouth daily.   CRANBERRY PO Take 2 tablets by mouth daily. 4200 mg   Cyanocobalamin (VITAMIN B-12) 2500 MCG SUBL Place 2,500 mcg under the tongue daily.   estradiol (ESTRACE) 0.1 MG/GM vaginal cream Place 1 Applicatorful vaginally every other day. In the evening   fexofenadine (ALLEGRA) 180 MG tablet Take 180 mg by mouth daily.   fluticasone (FLONASE) 50 MCG/ACT nasal spray Place 2 sprays into both nostrils daily.   lisinopril (ZESTRIL) 10 MG tablet TAKE 1 TABLET(10 MG) BY MOUTH DAILY   Misc Natural Products (TART CHERRY ADVANCED) CAPS Take 2 capsules by mouth daily.    Multiple Vitamins-Minerals (MULTIVITAMIN WITH MINERALS) tablet Take 1 tablet by mouth daily. Centrum silver   ONETOUCH DELICA LANCETS FINE MISC USE TO CHECK BLOOD SUGAR ONCE DAILY   ONETOUCH VERIO test strip USE TO TEST BLOOD GLUCOSE TWICE DAILY   OVER THE COUNTER MEDICATION Take 1 tablet by mouth daily as needed (  constipation). Vital Lax   polyethylene glycol (MIRALAX / GLYCOLAX) packet Take 17 g by mouth daily as needed for mild constipation.   simvastatin (ZOCOR) 40 MG tablet TAKE 1 TABLET BY MOUTH ONCE DAILY AT 6PM (Patient taking differently: Take 40 mg by mouth daily at 6 PM. )   trimethoprim (TRIMPEX) 100 MG tablet Take 100 mg by mouth as needed.   Turmeric 500 MG CAPS Take 1,000 mg by mouth daily.    methylPREDNISolone (MEDROL DOSEPAK) 4 MG TBPK tablet Take as  directed   traZODone (DESYREL) 50 MG tablet TAKE 1/2 TO 1 TABLET(25 TO 50 MG) BY MOUTH AT BEDTIME AS NEEDED FOR SLEEP (Patient not taking: Reported on 06/10/2020)   [DISCONTINUED] valsartan (DIOVAN) 160 MG tablet Take 1/2 tablet by mouth once daily   No facility-administered encounter medications on file as of 06/10/2020.    Allergies (verified) Penicillins   History: Past Medical History:  Diagnosis Date   Chronic constipation    Depression    Diabetes mellitus type II    no longer taking medication   DJD (degenerative joint disease)    knees   Female cystocele    GERD (gastroesophageal reflux disease)    History of esophagitis    History of left inguinal hernia    History of MRSA infection    recurrent carbuncle   History of recurrent UTIs    Hyperlipidemia    Hypertension    Urgency of urination    Past Surgical History:  Procedure Laterality Date   APPENDECTOMY  1980's   BREAST EXCISIONAL BIOPSY Left 1989   COLONOSCOPY  last one 06-18-2015   INGUINAL HERNIA REPAIR Left 05-10-2001   LAPAROSCOPIC LYSIS OF ADHESIONS  01/17/2019   OOPHORECTOMY Right 01/17/2019   PERINEOPLASTY  01/17/2019   SALPINGECTOMY Left 01/17/2019   TOTAL HIP ARTHROPLASTY Right 04/09/2020   Procedure: RIGH TOTAL HIP ARTHROPLASTY ANTERIOR APPROACH;  Surgeon: Melrose Nakayama, MD;  Location: [facility];  Service: Orthopedics;  Laterality: Right;   VAGINAL HYSTERECTOMY  1980's   VAGINAL PROLAPSE REPAIR N/A 03/02/2016   Procedure: COLOPLAST ANTERIOR  VAULT REPAIR WITH AXIS DERMIS. SACROSPINUS FIXATION, AUGMENTATION WITH AXIS DERMIS;  Surgeon: Carolan Clines, MD;  Location: [facility];  Service: Urology;  Laterality: N/A;   Family History  Problem Relation Age of Onset   Aneurysm Mother 16       deceased secondary to brain aneurysm   Liver cancer Father 13       deceased   Diabetes Other        grandmother   Colon cancer Neg Hx    Stomach cancer Neg Hx      Rectal cancer Neg Hx    Esophageal cancer Neg Hx    Breast cancer Neg Hx    Social History   Socioeconomic History   Marital status: Single    Spouse name: Not on file   Number of children: Not on file   Years of education: Not on file   Highest education level: Not on file  Occupational History   Not on file  Tobacco Use   Smoking status: Former Smoker    Years: 1.00    Types: Cigarettes    Quit date: 02/28/1996    Years since quitting: 24.2   Smokeless tobacco: Never Used  Vaping Use   Vaping Use: Never used  Substance and Sexual Activity   Alcohol use: Yes    Alcohol/week: 0.0 standard drinks    Comment: occasional   Drug  use: No   Sexual activity: Not on file  Other Topics Concern   Not on file  Social History Narrative   Single   Former Smoker  -  quit 10 to 11 years ago (light smoker)   Alcohol use-yes     2 children    Occupation: [employer]    Social Determinants of Health   Financial Resource Strain: Low Risk    Difficulty of Paying Living Expenses: Not hard at all  Food Insecurity: No Food Insecurity   Worried About Charity fundraiser in the Last Year: Never true   Arboriculturist in the Last Year: Never true  Transportation Needs: No Transportation Needs   Lack of Transportation (Medical): No   Lack of Transportation (Non-Medical): No  Physical Activity: Inactive   Days of Exercise per Week: 0 days   Minutes of Exercise per Session: 0 min  Stress: No Stress Concern Present   Feeling of Stress : Not at all  Social Connections: Moderately Isolated   Frequency of Communication with Friends and Family: More than three times a week   Frequency of Social Gatherings with Friends and Family: More than three times a week   Attends Religious Services: More than 4 times per year   Active Member of [employer] or Organizations: No   Attends Music therapist: Never   Marital Status: Never married     Tobacco Counseling Counseling given: Not Answered   Clinical Intake:  Pre-visit preparation completed: Yes  Pain : 0-10 Pain Score: 5  Pain Type: Chronic pain Pain Location: Hip (Left hip) Pain Orientation: Left Pain Descriptors / Indicators: Aching Pain Onset: More than a month ago Pain Frequency: Constant Pain Relieving Factors: Steroid injections  Pain Relieving Factors: Steroid injections  Nutritional Risks: Other (Comment) (Constipation) Diabetes: Yes CBG done?: No Did pt. bring in CBG monitor from home?: No (takes glucose daily)  How often do you need to have someone help you when you read instructions, pamphlets, or other written materials from your doctor or pharmacy?: 1 - Never What is the last grade level you completed in school?: 1 year of college  Diabetic?Yes   Interpreter Needed?: No  Information entered by :: [employer] of Daily Living In your present state of health, do you have any difficulty performing the following activities: 06/10/2020 04/09/2020  Hearing? N Y  Vision? N N  Difficulty concentrating or making decisions? N N  Walking or climbing stairs? N Y  Dressing or bathing? N N  Doing errands, shopping? N N  Preparing Food and eating ? N -  Using the Toilet? N -  In the past six months, have you accidently leaked urine? N -  Do you have problems with loss of bowel control? N -  Managing your Medications? N -  Managing your Finances? N -  Housekeeping or managing your Housekeeping? N -  Some recent data might be hidden    Patient Care Team: [name], NP as PCP - General (Family Medicine) Lucas Mallow, MD as Consulting Physician (Urology)  Indicate any recent Medical Services you may have received from other than [facility] providers in the past year (date may be approximate).     Assessment:   This is a routine wellness examination for Cathy [name].  Hearing/Vision screen  Hearing Screening   125Hz 250Hz 500Hz  1000Hz 2000Hz 3000Hz 4000Hz 6000Hz 8000Hz  Right ear:  Left ear:           Vision Screening Comments: Gets annual eye exam   Dietary issues and exercise activities discussed: Current Exercise Habits: The patient does not participate in regular exercise at present (Had recent hip surgery), Exercise limited by: orthopedic condition(s)  Goals     Patient Stated     I will continue to take my medications as prescribed       Depression Screen PHQ 2/9 Scores 06/10/2020 07/28/2019 07/01/2017 07/01/2016 07/17/2014  PHQ - 2 Score 0 0 0 0 1  PHQ- 9 Score 0 - - - -    Fall Risk Fall Risk  06/10/2020 07/28/2019 07/01/2017 07/01/2016 07/17/2014  Falls in the past year? 0 0 No No No  Number falls in past yr: 0 - - - -  Injury with Fall? 0 - - - -  Risk for fall due to : History of fall(s);Medication side effect;Orthopedic patient - - - -  Follow up Falls evaluation completed;Falls prevention discussed - - - -    Any stairs in or around the home? Yes  If so, are there any without handrails? No Home free of loose throw rugs in walkways, pet beds, electrical cords, etc? Yes  Adequate lighting in your home to reduce risk of falls? Yes   ASSISTIVE DEVICES UTILIZED TO PREVENT FALLS:  Life alert? No  Use of a cane, walker or w/c? No  Grab bars in the bathroom? No  Shower chair or bench in shower? No  Elevated toilet seat or a handicapped toilet? No    Cognitive Function:     6CIT Screen 06/10/2020  What Year? 0 points  What month? 0 points  What time? 0 points  Count back from 20 0 points  Months in reverse 0 points  Repeat phrase 2 points  Total Score 2    Immunizations Immunization History  Administered Date(s) Administered   Fluad Quad(high Dose 65+) 07/28/2019   Influenza Split 09/22/2011, 11/02/2012   Influenza Whole 08/27/2008, 08/29/2010   Influenza, High Dose Seasonal PF 08/26/2017, 08/26/2017, 08/06/2018   Influenza,inj,Quad PF,6+ Mos 10/18/2013, 07/17/2014   Moderna  SARS-COVID-2 Vaccination 12/17/2019, 01/14/2020   Pneumococcal Conjugate-13 10/10/2015   Pneumococcal Polysaccharide-23 12/18/2011, 07/01/2017, 08/28/2017   Td 12/19/2007   Zoster 03/07/2015   Zoster Recombinat (Shingrix) 08/06/2018, 10/10/2018    TDAP status: Due, Education has been provided regarding the importance of this vaccine. Advised may receive this vaccine at local pharmacy or Health Dept. Aware to provide a copy of the vaccination record if obtained from local pharmacy or Health Dept. Verbalized acceptance and understanding. Flu Vaccine status: Up to date Pneumococcal vaccine status: Up to date Covid-19 vaccine status: Completed vaccines  Qualifies for Shingles Vaccine? Yes   Zostavax completed Yes   Shingrix Completed?: Yes  Screening Tests Health Maintenance  Topic Date Due   TETANUS/TDAP  12/18/2017   OPHTHALMOLOGY EXAM  08/18/2018   INFLUENZA VACCINE  06/23/2020   FOOT EXAM  07/27/2020   HEMOGLOBIN A1C  10/03/2020   MAMMOGRAM  05/02/2022   COLONOSCOPY  06/17/2025   DEXA SCAN  Completed   COVID-19 Vaccine  Completed   Hepatitis C Screening  Completed   PNA vac Low Risk Adult  Completed    Health Maintenance  Health Maintenance Due  Topic Date Due   TETANUS/TDAP  12/18/2017   OPHTHALMOLOGY EXAM  08/18/2018    Colorectal cancer screening: Completed 06/18/2015. Repeat every 10 years Mammogram status: Completed 05/02/2020. Repeat every year Bone  Density status: Completed 08/03/2019. Results reflect: Bone density results: OSTEOPOROSIS. Repeat every 2 years.  Lung Cancer Screening: (Low Dose CT Chest recommended if Age 39-80 years, 30 pack-year currently smoking OR have quit w/in 15years.) does not qualify.   Lung Cancer Screening Referral: N/A  Additional Screening:  Hepatitis C Screening: does qualify; Completed 10/28/2015  Vision Screening: Recommended annual ophthalmology exams for early detection of glaucoma and other disorders of  the eye. Is the patient up to date with their annual eye exam?  Yes  Who is the provider or what is the name of the office in which the patient attends annual eye exams? [facility]  If pt is not established with a provider, would they like to be referred to a provider to establish care? No .   Dental Screening: Recommended annual dental exams for proper oral hygiene  Community Resource Referral / Chronic Care Management: CRR required this visit?  No   CCM required this visit?  No      Plan:     I have personally reviewed and noted the following in the patients chart:    Medical and social history  Use of alcohol, tobacco or illicit drugs   Current medications and supplements  Functional ability and status  Nutritional status  Physical activity  Advanced directives  List of other physicians  Hospitalizations, surgeries, and ER visits in previous 12 months  Vitals  Screenings to include cognitive, depression, and falls  Referrals and appointments  In addition, I have reviewed and discussed with patient certain preventive protocols, quality metrics, and best practice recommendations. A written personalized care plan for preventive services as well as general preventive health recommendations were provided to patient.     Ofilia Neas, LPN   1/96/[id number]   Nurse Notes: None

## 2020-06-11 ENCOUNTER — Encounter: Payer: Self-pay | Admitting: Family Medicine

## 2020-06-11 ENCOUNTER — Ambulatory Visit (INDEPENDENT_AMBULATORY_CARE_PROVIDER_SITE_OTHER): Payer: Medicare Other | Admitting: Family Medicine

## 2020-06-11 ENCOUNTER — Ambulatory Visit: Payer: Medicare Other

## 2020-06-11 ENCOUNTER — Ambulatory Visit: Payer: Medicare Other | Admitting: Adult Health

## 2020-06-11 ENCOUNTER — Ambulatory Visit: Payer: Self-pay

## 2020-06-11 VITALS — BP 140/80 | HR 105 | Ht 64.0 in | Wt 138.0 lb

## 2020-06-11 DIAGNOSIS — M1612 Unilateral primary osteoarthritis, left hip: Secondary | ICD-10-CM

## 2020-06-11 DIAGNOSIS — M25552 Pain in left hip: Secondary | ICD-10-CM

## 2020-06-11 DIAGNOSIS — M7062 Trochanteric bursitis, left hip: Secondary | ICD-10-CM

## 2020-06-11 NOTE — Assessment & Plan Note (Signed)
X-rays pending.  May need intra-articular injection at follow-up patient will need surgical intervention at some point

## 2020-06-11 NOTE — Progress Notes (Signed)
[name] [place] [employer] Phone: [phone] Subjective:   Cathy [name], am serving as a scribe for Dr. Hulan Saas.  This visit occurred during the SARS-CoV-2 public health emergency.  Safety protocols were in place, including screening questions prior to the visit, additional usage of staff PPE, and extensive cleaning of exam room while observing appropriate contact time as indicated for disinfecting solutions.   I'm seeing this patient by the request  of:  [name], NP  CC: L hip  05/13/2020 W/ Dr. Aura Fey Cathy [name] is a 74 y.o. female who presents to [place] at [facility] today for f/u of L knee and L hip pain.  She was last seen by Dr. Tamala Julian on 02/27/20 for B knee pain and R hip pain.  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  Update 06/11/2020  Patient complains of L hip pain starting ever since she healed from R hip replacement. Patient walking with a limp because of the pain.  Past Medical History:  Diagnosis Date  . Chronic constipation   . Depression   . Diabetes mellitus type II    no longer taking medication  . DJD (degenerative joint disease)    knees  . Female cystocele   . GERD (gastroesophageal reflux disease)   . History of esophagitis   . History of left inguinal hernia   . History of MRSA infection    recurrent carbuncle  . History of recurrent UTIs   . Hyperlipidemia   . Hypertension   . Urgency of urination    Past Surgical History:  Procedure Laterality Date  . APPENDECTOMY  1980's  . BREAST EXCISIONAL BIOPSY Left 1989  . COLONOSCOPY  last one 06-18-2015  . INGUINAL HERNIA REPAIR Left 05-10-2001  . LAPAROSCOPIC LYSIS OF ADHESIONS  01/17/2019  . OOPHORECTOMY Right 01/17/2019  . PERINEOPLASTY  01/17/2019  . SALPINGECTOMY Left 01/17/2019  . TOTAL HIP ARTHROPLASTY Right 04/09/2020   Procedure: RIGH TOTAL HIP ARTHROPLASTY ANTERIOR  APPROACH;  Surgeon: Melrose Nakayama, MD;  Location: [facility];  Service: Orthopedics;  Laterality: Right;  [name] VAGINAL HYSTERECTOMY  1980's  . VAGINAL PROLAPSE REPAIR N/A 03/02/2016   Procedure: COLOPLAST ANTERIOR  VAULT REPAIR WITH AXIS DERMIS. SACROSPINUS FIXATION, AUGMENTATION WITH AXIS DERMIS;  Surgeon: Carolan Clines, MD;  Location: [facility];  Service: Urology;  Laterality: N/A;   Social History   Socioeconomic History  . Marital status: Single    Spouse name: Not on file  . Number of children: Not on file  . Years of education: Not on file  . Highest education level: Not on file  Occupational History  . Not on file  Tobacco Use  . Smoking status: Former Smoker    Years: 1.00    Types: Cigarettes    Quit date: 02/28/1996    Years since quitting: 24.3  . Smokeless tobacco: Never Used  Vaping Use  . Vaping Use: Never used  Substance and Sexual Activity  . Alcohol use: Yes    Alcohol/week: 0.0 standard drinks    Comment: occasional  . Drug use: No  . Sexual activity: Not on file  Other Topics Concern  . Not on file  Social History Narrative   Single   Former Smoker  -  quit 10 to 11 years ago (light smoker)   Alcohol use-yes     2 children  Occupation: [employer]    Social Determinants of Health   Financial Resource Strain: Low Risk   . Difficulty of Paying Living Expenses: Not hard at all  Food Insecurity: No Food Insecurity  . Worried About Charity fundraiser in the Last Year: Never true  . Ran Out of Food in the Last Year: Never true  Transportation Needs: No Transportation Needs  . Lack of Transportation (Medical): No  . Lack of Transportation (Non-Medical): No  Physical Activity: Inactive  . Days of Exercise per Week: 0 days  . Minutes of Exercise per Session: 0 min  Stress: No Stress Concern Present  . Feeling of Stress : Not at all  Social Connections: Moderately Isolated  . Frequency of Communication with Friends and  Family: More than three times a week  . Frequency of Social Gatherings with Friends and Family: More than three times a week  . Attends Religious Services: More than 4 times per year  . Active Member of Clubs or Organizations: No  . Attends Archivist Meetings: Never  . Marital Status: Never married   Allergies  Allergen Reactions  . Penicillins Hives    Has patient had a PCN reaction causing immediate rash, facial/tongue/throat swelling, SOB or lightheadedness with hypotension: Yes Has patient had a PCN reaction causing severe rash involving mucus membranes or skin necrosis: No Has patient had a PCN reaction that required hospitalization: No Has patient had a PCN reaction occurring within the last 10 years: No If all of the above answers are "NO", then may proceed with Cephalosporin use.    Family History  Problem Relation Age of Onset  . Aneurysm Mother 5       deceased secondary to brain aneurysm  . Liver cancer Father 73       deceased  . Diabetes Other        grandmother  . Colon cancer Neg Hx   . Stomach cancer Neg Hx   . Rectal cancer Neg Hx   . Esophageal cancer Neg Hx   . Breast cancer Neg Hx     Current Outpatient Medications (Endocrine & Metabolic):  .  methylPREDNISolone (MEDROL DOSEPAK) 4 MG TBPK tablet, Take as directed  Current Outpatient Medications (Cardiovascular):  .  lisinopril (ZESTRIL) 10 MG tablet, TAKE 1 TABLET(10 MG) BY MOUTH DAILY .  simvastatin (ZOCOR) 40 MG tablet, TAKE 1 TABLET BY MOUTH ONCE DAILY AT 6PM (Patient taking differently: Take 40 mg by mouth daily at 6 PM. )  Current Outpatient Medications (Respiratory):  .  fexofenadine (ALLEGRA) 180 MG tablet, Take 180 mg by mouth daily. .  fluticasone (FLONASE) 50 MCG/ACT nasal spray, Place 2 sprays into both nostrils daily.  Current Outpatient Medications (Analgesics):  .  aspirin 81 MG tablet, Take 1 tablet (81 mg total) by mouth 2 (two) times daily after a meal.  Current Outpatient  Medications (Hematological):  [name]  Cyanocobalamin (VITAMIN B-12) 2500 MCG SUBL, Place 2,500 mcg under the tongue daily.  Current Outpatient Medications (Other):  [name]  Ascorbic Acid (VITAMIN C) 1000 MG tablet, Take 1,000 mg by mouth daily. [name]  b complex vitamins capsule, Take 1 capsule by mouth daily. .  Biotin 1000 MCG tablet, Take 1,000 mcg by mouth daily. .  blood glucose meter kit and supplies KIT, Dispense based on patient and insurance preference. Use once daily to test blood glucose. (FOR ICD-E11.9). .  Calcium Carbonate-Vitamin D (CALTRATE 600+D) 600-400 MG-UNIT per tablet, Take 1 tablet  by mouth daily.   .  Cinnamon 500 MG capsule, Take 1,000 mg by mouth daily. [name]  CRANBERRY PO, Take 2 tablets by mouth daily. 4200 mg .  estradiol (ESTRACE) 0.1 MG/GM vaginal cream, Place 1 Applicatorful vaginally every other day. In the evening .  Misc Natural Products (TART CHERRY ADVANCED) CAPS, Take 2 capsules by mouth daily.  .  Multiple Vitamins-Minerals (MULTIVITAMIN WITH MINERALS) tablet, Take 1 tablet by mouth daily. Centrum silver .  ONETOUCH DELICA LANCETS FINE MISC, USE TO CHECK BLOOD SUGAR ONCE DAILY .  ONETOUCH VERIO test strip, USE TO TEST BLOOD GLUCOSE TWICE DAILY .  OVER THE COUNTER MEDICATION, Take 1 tablet by mouth daily as needed (constipation). Vital Lax .  polyethylene glycol (MIRALAX / GLYCOLAX) packet, Take 17 g by mouth daily as needed for mild constipation. .  traZODone (DESYREL) 50 MG tablet, TAKE 1/2 TO 1 TABLET(25 TO 50 MG) BY MOUTH AT BEDTIME AS NEEDED FOR SLEEP .  trimethoprim (TRIMPEX) 100 MG tablet, Take 100 mg by mouth as needed. .  Turmeric 500 MG CAPS, Take 1,000 mg by mouth daily.    Reviewed prior external information including notes and imaging from  primary care provider As well as notes that were available from care everywhere and other healthcare systems.  Past medical history, social, surgical and family history all reviewed in electronic medical record.  No  pertanent information unless stated regarding to the chief complaint.   Review of Systems:  No headache, visual changes, nausea, vomiting, diarrhea, constipation, dizziness, abdominal pain, skin rash, fevers, chills, night sweats, weight loss, swollen lymph nodes, body aches, joint swelling, chest pain, shortness of breath, mood changes. POSITIVE muscle aches  Objective  Blood pressure 140/80, pulse (!) 105, height 5' 4"  (1.626 m), weight 138 lb (62.6 kg), SpO2 98 %.   General: No apparent distress alert and oriented x3 mood and affect normal, dressed appropriately.  HEENT: Pupils equal, extraocular movements intact  Respiratory: Patient's speak in full sentences and does not appear short of breath  Cardiovascular: No lower extremity edema, non tender, no erythema  Neuro: Cranial nerves II through XII are intact, neurovascularly intact in all extremities with 2+ DTRs and 2+ pulses.  Gait antalgic Left hip does have decreased range of motion with internal range of motion with some mild groin pain.  More tenderness over the greater trochanteric area on the left.  Mild pain in the paraspinal musculature lumbar spine but no true radicular symptoms with straight leg test.  Right hip shows recent replacement.   Procedure: Real-time Ultrasound Guided Injection of left  greater trochanteric bursitis secondary to patient's body habitus Device: GE Logiq Q7  Ultrasound guided injection is preferred based studies that show increased duration, increased effect, greater accuracy, decreased procedural pain, increased response rate, and decreased cost with ultrasound guided versus blind injection.  Verbal informed consent obtained.  Time-out conducted.  Noted no overlying erythema, induration, or other signs of local infection.  Skin prepped in a sterile fashion.  Local anesthesia: Topical Ethyl chloride.  With sterile technique and under real time ultrasound guidance:  Greater trochanteric area was  visualized and patient's bursa was noted. A 22-gauge 3 inch needle was inserted and 4 cc of 0.5% Marcaine and 1 cc of Kenalog 40 mg/dL was injected. Pictures taken Completed without difficulty  Pain immediately resolved suggesting accurate placement of the medication.  Advised to call if fevers/chills, erythema, induration, drainage, or persistent bleeding.  Images permanently stored and available for review  in the ultrasound unit.  Impression: Technically successful ultrasound guided injection.    Impression and Recommendations:     The above documentation has been reviewed and is accurate and complete [name], DO       Note: This dictation was prepared with Dragon dictation along with smaller phrase technology. Any transcriptional errors that result from this process are unintentional.

## 2020-06-11 NOTE — Assessment & Plan Note (Signed)
Repeat injection given today.  Discussed icing regimen of home exercise, icing regimen.  Increase activity slowly.  Do believe that patient does have underlying arthritic changes of the left hip that likely will need to be evaluated.  X-rays ordered today.  Discussed medications follow-up again in 4 to 8 weeks if no improvement intra-articular injection

## 2020-06-11 NOTE — Patient Instructions (Addendum)
Good to see you Xray on the hip today  See me again in 6-8 weeks if no better injection

## 2020-06-12 ENCOUNTER — Ambulatory Visit: Payer: Medicare Other | Admitting: Adult Health

## 2020-06-13 ENCOUNTER — Encounter: Payer: Self-pay | Admitting: Adult Health

## 2020-06-13 ENCOUNTER — Ambulatory Visit (INDEPENDENT_AMBULATORY_CARE_PROVIDER_SITE_OTHER): Payer: Medicare Other | Admitting: Adult Health

## 2020-06-13 ENCOUNTER — Other Ambulatory Visit: Payer: Self-pay

## 2020-06-13 VITALS — BP 146/80 | Temp 98.0°F | Wt 138.0 lb

## 2020-06-13 DIAGNOSIS — G5603 Carpal tunnel syndrome, bilateral upper limbs: Secondary | ICD-10-CM | POA: Diagnosis not present

## 2020-06-13 MED ORDER — PREDNISONE 10 MG PO TABS: ORAL_TABLET | ORAL | 0 refills | Status: DC

## 2020-06-13 NOTE — Progress Notes (Signed)
Subjective:    Patient ID: [name], female    DOB: [birth year], 74 y.o.   MRN: [id number]  HPI 74 year old female who  has a past medical history of Chronic constipation, Depression, Diabetes mellitus type II, DJD (degenerative joint disease), Female cystocele, GERD (gastroesophageal reflux disease), History of esophagitis, History of left inguinal hernia, History of MRSA infection, History of recurrent UTIs, Hyperlipidemia, Hypertension, and Urgency of urination.  She presents to the office today for an acute issue of numbness and tingling in bilateral hands.  Numbness and tingling is worse at night and she often feels as though her hands go to sleep when she is resting.  Does not feel as though she has much weakness in bilateral hands.  Pain does not radiate past her wrist   Review of Systems See HPI   Past Medical History:  Diagnosis Date   Chronic constipation    Depression    Diabetes mellitus type II    no longer taking medication   DJD (degenerative joint disease)    knees   Female cystocele    GERD (gastroesophageal reflux disease)    History of esophagitis    History of left inguinal hernia    History of MRSA infection    recurrent carbuncle   History of recurrent UTIs    Hyperlipidemia    Hypertension    Urgency of urination     Social History   Socioeconomic History   Marital status: Single    Spouse name: Not on file   Number of children: Not on file   Years of education: Not on file   Highest education level: Not on file  Occupational History   Not on file  Tobacco Use   Smoking status: Former Smoker    Years: 1.00    Types: Cigarettes    Quit date: 02/28/1996    Years since quitting: 24.3   Smokeless tobacco: Never Used  Vaping Use   Vaping Use: Never used  Substance and Sexual Activity   Alcohol use: Yes    Alcohol/week: 0.0 standard drinks    Comment: occasional   Drug use: No   Sexual activity: Not on file    Other Topics Concern   Not on file  Social History Narrative   Single   Former Smoker  -  quit 10 to 11 years ago (light smoker)   Alcohol use-yes     2 children    Occupation: [employer]    Social Determinants of Health   Financial Resource Strain: Low Risk    Difficulty of Paying Living Expenses: Not hard at all  Food Insecurity: No Food Insecurity   Worried About Charity fundraiser in the Last Year: Never true   Arboriculturist in the Last Year: Never true  Transportation Needs: No Transportation Needs   Lack of Transportation (Medical): No   Lack of Transportation (Non-Medical): No  Physical Activity: Inactive   Days of Exercise per Week: 0 days   Minutes of Exercise per Session: 0 min  Stress: No Stress Concern Present   Feeling of Stress : Not at all  Social Connections: Moderately Isolated   Frequency of Communication with Friends and Family: More than three times a week   Frequency of Social Gatherings with Friends and Family: More than three times a week   Attends Religious Services: More than 4 times per year   Active Member of Clubs or Organizations: No  Attends Archivist Meetings: Never   Marital Status: Never married  Intimate Partner Violence: Not At Risk   Fear of Current or Ex-Partner: No   Emotionally Abused: No   Physically Abused: No   Sexually Abused: No    Past Surgical History:  Procedure Laterality Date   APPENDECTOMY  1980's   BREAST EXCISIONAL BIOPSY Left 1989   COLONOSCOPY  last one 06-18-2015   INGUINAL HERNIA REPAIR Left 05-10-2001   LAPAROSCOPIC LYSIS OF ADHESIONS  01/17/2019   OOPHORECTOMY Right 01/17/2019   PERINEOPLASTY  01/17/2019   SALPINGECTOMY Left 01/17/2019   TOTAL HIP ARTHROPLASTY Right 04/09/2020   Procedure: RIGH TOTAL HIP ARTHROPLASTY ANTERIOR APPROACH;  Surgeon: Melrose Nakayama, MD;  Location: [facility];  Service: Orthopedics;  Laterality: Right;   VAGINAL HYSTERECTOMY   1980's   VAGINAL PROLAPSE REPAIR N/A 03/02/2016   Procedure: COLOPLAST ANTERIOR  VAULT REPAIR WITH AXIS DERMIS. SACROSPINUS FIXATION, AUGMENTATION WITH AXIS DERMIS;  Surgeon: Carolan Clines, MD;  Location: [facility];  Service: Urology;  Laterality: N/A;    Family History  Problem Relation Age of Onset   Aneurysm Mother 37       deceased secondary to brain aneurysm   Liver cancer Father 44       deceased   Diabetes Other        grandmother   Colon cancer Neg Hx    Stomach cancer Neg Hx    Rectal cancer Neg Hx    Esophageal cancer Neg Hx    Breast cancer Neg Hx     Allergies  Allergen Reactions   Penicillins Hives    Has patient had a PCN reaction causing immediate rash, facial/tongue/throat swelling, SOB or lightheadedness with hypotension: Yes Has patient had a PCN reaction causing severe rash involving mucus membranes or skin necrosis: No Has patient had a PCN reaction that required hospitalization: No Has patient had a PCN reaction occurring within the last 10 years: No If all of the above answers are "NO", then may proceed with Cephalosporin use.     Current Outpatient Medications on File Prior to Visit  Medication Sig Dispense Refill   Ascorbic Acid (VITAMIN C) 1000 MG tablet Take 1,000 mg by mouth daily.     aspirin 81 MG tablet Take 1 tablet (81 mg total) by mouth 2 (two) times daily after a meal. 60 tablet 0   b complex vitamins capsule Take 1 capsule by mouth daily.     Biotin 1000 MCG tablet Take 1,000 mcg by mouth daily.     blood glucose meter kit and supplies KIT Dispense based on patient and insurance preference. Use once daily to test blood glucose. (FOR ICD-E11.9). 1 each 0   Calcium Carbonate-Vitamin D (CALTRATE 600+D) 600-400 MG-UNIT per tablet Take 1 tablet by mouth daily.       Cinnamon 500 MG capsule Take 1,000 mg by mouth daily.     CRANBERRY PO Take 2 tablets by mouth daily. 4200 mg     Cyanocobalamin (VITAMIN B-12)  2500 MCG SUBL Place 2,500 mcg under the tongue daily.     estradiol (ESTRACE) 0.1 MG/GM vaginal cream Place 1 Applicatorful vaginally every other day. In the evening  3   fexofenadine (ALLEGRA) 180 MG tablet Take 180 mg by mouth daily.     fluticasone (FLONASE) 50 MCG/ACT nasal spray Place 2 sprays into both nostrils daily.     lisinopril (ZESTRIL) 10 MG tablet TAKE 1 TABLET(10 MG) BY MOUTH DAILY 90 tablet  0   Misc Natural Products (TART CHERRY ADVANCED) CAPS Take 2 capsules by mouth daily.      Multiple Vitamins-Minerals (MULTIVITAMIN WITH MINERALS) tablet Take 1 tablet by mouth daily. Centrum silver     ONETOUCH DELICA LANCETS FINE MISC USE TO CHECK BLOOD SUGAR ONCE DAILY 100 each 3   ONETOUCH VERIO test strip USE TO TEST BLOOD GLUCOSE TWICE DAILY 200 strip 3   OVER THE COUNTER MEDICATION Take 1 tablet by mouth daily as needed (constipation). Vital Lax     polyethylene glycol (MIRALAX / GLYCOLAX) packet Take 17 g by mouth daily as needed for mild constipation.     simvastatin (ZOCOR) 40 MG tablet TAKE 1 TABLET BY MOUTH ONCE DAILY AT 6PM (Patient taking differently: Take 40 mg by mouth daily at 6 PM. ) 90 tablet 0   traZODone (DESYREL) 50 MG tablet TAKE 1/2 TO 1 TABLET(25 TO 50 MG) BY MOUTH AT BEDTIME AS NEEDED FOR SLEEP 30 tablet 3   trimethoprim (TRIMPEX) 100 MG tablet Take 100 mg by mouth as needed.     Turmeric 500 MG CAPS Take 1,000 mg by mouth daily.      [DISCONTINUED] valsartan (DIOVAN) 160 MG tablet Take 1/2 tablet by mouth once daily 30 tablet 5   No current facility-administered medications on file prior to visit.    BP (!) 146/80    Temp 98 F (36.7 C)    Wt 138 lb (62.6 kg)    BMI 23.69 kg/m       Objective:   Physical Exam Vitals and nursing note reviewed.  Constitutional:      Appearance: Normal appearance.  Skin:    General: Skin is warm and dry.  Neurological:     General: No focal deficit present.     Mental Status: She is alert and oriented to  person, place, and time.     Comments: Positive Tinel and Phalens test.  No decreased grip strength   Psychiatric:        Mood and Affect: Mood normal.        Behavior: Behavior normal.        Thought Content: Thought content normal.        Judgment: Judgment normal.       Assessment & Plan:  1. Carpal tunnel syndrome on both sides -Trial 2-week prednisone taper.  She was advised to follow-up if no improvement and then can refer to either neurology or sports medicine for further treatment - predniSONE (DELTASONE) 10 MG tablet; Take two tabs x 7 days and then 1 tab daily x 7 days  Dispense: 21 tablet; Refill: 0  [name], NP

## 2020-06-28 DIAGNOSIS — M25552 Pain in left hip: Secondary | ICD-10-CM | POA: Diagnosis not present

## 2020-06-28 DIAGNOSIS — Z96641 Presence of right artificial hip joint: Secondary | ICD-10-CM | POA: Diagnosis not present

## 2020-06-28 DIAGNOSIS — Z471 Aftercare following joint replacement surgery: Secondary | ICD-10-CM | POA: Diagnosis not present

## 2020-07-11 ENCOUNTER — Other Ambulatory Visit: Payer: Self-pay | Admitting: Adult Health

## 2020-07-11 DIAGNOSIS — E785 Hyperlipidemia, unspecified: Secondary | ICD-10-CM

## 2020-07-19 ENCOUNTER — Telehealth: Payer: Self-pay | Admitting: Adult Health

## 2020-07-19 ENCOUNTER — Other Ambulatory Visit: Payer: Self-pay

## 2020-07-19 ENCOUNTER — Ambulatory Visit (INDEPENDENT_AMBULATORY_CARE_PROVIDER_SITE_OTHER): Payer: Medicare Other | Admitting: Adult Health

## 2020-07-19 ENCOUNTER — Encounter: Payer: Self-pay | Admitting: Adult Health

## 2020-07-19 VITALS — BP 140/80 | HR 107 | Temp 98.0°F | Ht 64.0 in | Wt 144.0 lb

## 2020-07-19 DIAGNOSIS — N390 Urinary tract infection, site not specified: Secondary | ICD-10-CM | POA: Diagnosis not present

## 2020-07-19 LAB — POCT URINALYSIS DIPSTICK
Bilirubin, UA: NEGATIVE
Blood, UA: NEGATIVE
Glucose, UA: NEGATIVE
Ketones, UA: NEGATIVE
Nitrite, UA: NEGATIVE
Protein, UA: NEGATIVE
Spec Grav, UA: 1.025 (ref 1.010–1.025)
Urobilinogen, UA: 0.2 E.U./dL
pH, UA: 6 (ref 5.0–8.0)

## 2020-07-19 MED ORDER — CIPROFLOXACIN HCL 500 MG PO TABS: 500.0000 mg | ORAL_TABLET | Freq: Two times a day (BID) | ORAL | 0 refills | Status: DC

## 2020-07-19 NOTE — Telephone Encounter (Signed)
Pt needs an appt. So that we can better assess her. Called pt no answer. Please schedule pt

## 2020-07-19 NOTE — Telephone Encounter (Signed)
Patient did not stay on hold while I found out what [name] would prefer a virtual or in office and Tillie Rung said to schedule at 4 and have the patient come in the back and give her the [phone] number to call upon arrive.

## 2020-07-19 NOTE — Telephone Encounter (Signed)
Pt is calling in stating that she has a runny nose, but she is having frequent urination w/lower back pain.  Can we get a order for lab?

## 2020-07-19 NOTE — Progress Notes (Signed)
Subjective:    Patient ID: Cathy [name], female    DOB: 1946-11-09, 74 y.o.   MRN: [id number]  HPI 74 year old female with recurrent UTIs presents to the office today for concern of a urinary tract infection.  Her symptoms started approximately 2 days.  Symptoms include low back pain, frequent urination, mild dysuria, and lower pelvic pressure.  She denies hematuria, fevers, chills, nausea, vomiting, or diarrhea.   Review of Systems See HPI   Past Medical History:  Diagnosis Date  . Chronic constipation   . Depression   . Diabetes mellitus type II    no longer taking medication  . DJD (degenerative joint disease)    knees  . Female cystocele   . GERD (gastroesophageal reflux disease)   . History of esophagitis   . History of left inguinal hernia   . History of MRSA infection    recurrent carbuncle  . History of recurrent UTIs   . Hyperlipidemia   . Hypertension   . Urgency of urination     Social History   Socioeconomic History  . Marital status: Single    Spouse name: Not on file  . Number of children: Not on file  . Years of education: Not on file  . Highest education level: Not on file  Occupational History  . Not on file  Tobacco Use  . Smoking status: Former Smoker    Years: 1.00    Types: Cigarettes    Quit date: 02/28/1996    Years since quitting: 24.4  . Smokeless tobacco: Never Used  Vaping Use  . Vaping Use: Never used  Substance and Sexual Activity  . Alcohol use: Yes    Alcohol/week: 0.0 standard drinks    Comment: occasional  . Drug use: No  . Sexual activity: Not on file  Other Topics Concern  . Not on file  Social History Narrative   Single   Former Smoker  -  quit 10 to 11 years ago (light smoker)   Alcohol use-yes     2 children    Occupation: [employer]    Social Determinants of Health   Financial Resource Strain: Low Risk   . Difficulty of Paying Living Expenses: Not hard at all  Food Insecurity: No Food  Insecurity  . Worried About Charity fundraiser in the Last Year: Never true  . Ran Out of Food in the Last Year: Never true  Transportation Needs: No Transportation Needs  . Lack of Transportation (Medical): No  . Lack of Transportation (Non-Medical): No  Physical Activity: Inactive  . Days of Exercise per Week: 0 days  . Minutes of Exercise per Session: 0 min  Stress: No Stress Concern Present  . Feeling of Stress : Not at all  Social Connections: Moderately Isolated  . Frequency of Communication with Friends and Family: More than three times a week  . Frequency of Social Gatherings with Friends and Family: More than three times a week  . Attends Religious Services: More than 4 times per year  . Active Member of Clubs or Organizations: No  . Attends Archivist Meetings: Never  . Marital Status: Never married  Intimate Partner Violence: Not At Risk  . Fear of Current or Ex-Partner: No  . Emotionally Abused: No  . Physically Abused: No  . Sexually Abused: No    Past Surgical History:  Procedure Laterality Date  . APPENDECTOMY  1980's  . BREAST EXCISIONAL BIOPSY Left 1989  .  COLONOSCOPY  last one 06-18-2015  . INGUINAL HERNIA REPAIR Left 05-10-2001  . LAPAROSCOPIC LYSIS OF ADHESIONS  01/17/2019  . OOPHORECTOMY Right 01/17/2019  . PERINEOPLASTY  01/17/2019  . SALPINGECTOMY Left 01/17/2019  . TOTAL HIP ARTHROPLASTY Right 04/09/2020   Procedure: RIGH TOTAL HIP ARTHROPLASTY ANTERIOR APPROACH;  Surgeon: Melrose Nakayama, MD;  Location: [facility];  Service: Orthopedics;  Laterality: Right;  [name] VAGINAL HYSTERECTOMY  1980's  . VAGINAL PROLAPSE REPAIR N/A 03/02/2016   Procedure: COLOPLAST ANTERIOR  VAULT REPAIR WITH AXIS DERMIS. SACROSPINUS FIXATION, AUGMENTATION WITH AXIS DERMIS;  Surgeon: Carolan Clines, MD;  Location: [facility];  Service: Urology;  Laterality: N/A;    Family History  Problem Relation Age of Onset  . Aneurysm Mother 100       deceased  secondary to brain aneurysm  . Liver cancer Father 48       deceased  . Diabetes Other        grandmother  . Colon cancer Neg Hx   . Stomach cancer Neg Hx   . Rectal cancer Neg Hx   . Esophageal cancer Neg Hx   . Breast cancer Neg Hx     Allergies  Allergen Reactions  . Penicillins Hives    Has patient had a PCN reaction causing immediate rash, facial/tongue/throat swelling, SOB or lightheadedness with hypotension: Yes Has patient had a PCN reaction causing severe rash involving mucus membranes or skin necrosis: No Has patient had a PCN reaction that required hospitalization: No Has patient had a PCN reaction occurring within the last 10 years: No If all of the above answers are "NO", then may proceed with Cephalosporin use.     Current Outpatient Medications on File Prior to Visit  Medication Sig Dispense Refill  . Ascorbic Acid (VITAMIN C) 1000 MG tablet Take 1,000 mg by mouth daily.    [name] aspirin 81 MG tablet Take 1 tablet (81 mg total) by mouth 2 (two) times daily after a meal. 60 tablet 0  . b complex vitamins capsule Take 1 capsule by mouth daily.    . Biotin 1000 MCG tablet Take 1,000 mcg by mouth daily.    . blood glucose meter kit and supplies KIT Dispense based on patient and insurance preference. Use once daily to test blood glucose. (FOR ICD-E11.9). 1 each 0  . Calcium Carbonate-Vitamin D (CALTRATE 600+D) 600-400 MG-UNIT per tablet Take 1 tablet by mouth daily.      . Cinnamon 500 MG capsule Take 1,000 mg by mouth daily.    [name] CRANBERRY PO Take 2 tablets by mouth daily. 4200 mg    . Cyanocobalamin (VITAMIN B-12) 2500 MCG SUBL Place 2,500 mcg under the tongue daily.    [name] estradiol (ESTRACE) 0.1 MG/GM vaginal cream Place 1 Applicatorful vaginally every other day. In the evening  3  . fexofenadine (ALLEGRA) 180 MG tablet Take 180 mg by mouth daily.    . fluticasone (FLONASE) 50 MCG/ACT nasal spray Place 2 sprays into both nostrils daily.    [name] lisinopril (ZESTRIL) 10 MG  tablet TAKE 1 TABLET(10 MG) BY MOUTH DAILY 90 tablet 0  . Misc Natural Products (TART CHERRY ADVANCED) CAPS Take 2 capsules by mouth daily.     . Multiple Vitamins-Minerals (MULTIVITAMIN WITH MINERALS) tablet Take 1 tablet by mouth daily. Centrum silver    . ONETOUCH DELICA LANCETS FINE MISC USE TO CHECK BLOOD SUGAR ONCE DAILY 100 each 3  . ONETOUCH VERIO test strip USE TO TEST BLOOD GLUCOSE TWICE  DAILY 200 strip 3  . OVER THE COUNTER MEDICATION Take 1 tablet by mouth daily as needed (constipation). Vital Lax    . polyethylene glycol (MIRALAX / GLYCOLAX) packet Take 17 g by mouth daily as needed for mild constipation.    . predniSONE (DELTASONE) 10 MG tablet Take two tabs x 7 days and then 1 tab daily x 7 days 21 tablet 0  . simvastatin (ZOCOR) 40 MG tablet TAKE 1 TABLET BY MOUTH EVERY DAY AT 6 PM 90 tablet 0  . traZODone (DESYREL) 50 MG tablet TAKE 1/2 TO 1 TABLET(25 TO 50 MG) BY MOUTH AT BEDTIME AS NEEDED FOR SLEEP 30 tablet 3  . trimethoprim (TRIMPEX) 100 MG tablet Take 100 mg by mouth as needed.    . Turmeric 500 MG CAPS Take 1,000 mg by mouth daily.     . [DISCONTINUED] valsartan (DIOVAN) 160 MG tablet Take 1/2 tablet by mouth once daily 30 tablet 5   No current facility-administered medications on file prior to visit.    BP (!) 160/88   Pulse (!) 107   Temp 98 F (36.7 C) (Oral)   Ht 5' 4"  (1.626 m)   Wt 144 lb (65.3 kg)   SpO2 96%   BMI 24.72 kg/m       Objective:   Physical Exam Vitals and nursing note reviewed.  Constitutional:      Appearance: Normal appearance.  Cardiovascular:     Rate and Rhythm: Normal rate and regular rhythm.     Pulses: Normal pulses.     Heart sounds: Normal heart sounds.  Pulmonary:     Effort: Pulmonary effort is normal.     Breath sounds: Normal breath sounds.  Abdominal:     General: Abdomen is flat. Bowel sounds are normal.     Palpations: Abdomen is soft.     Tenderness: There is abdominal tenderness. There is right CVA tenderness  and left CVA tenderness.  Musculoskeletal:        General: Normal range of motion.  Skin:    General: Skin is warm.  Neurological:     General: No focal deficit present.     Mental Status: She is alert.  Psychiatric:        Mood and Affect: Mood normal.        Behavior: Behavior normal.        Thought Content: Thought content normal.        Judgment: Judgment normal.       Assessment & Plan:   1. Urinary tract infection without hematuria, site unspecified  - POC Urinalysis Dipstick + leuks.  Treat due to symptoms and send culture.  She was advised to stay hydrated.  Follow-up if no improvement in the next 2 to 3 days - ciprofloxacin (CIPRO) 500 MG tablet; Take 1 tablet (500 mg total) by mouth 2 (two) times daily.  Dispense: 6 tablet; Refill: 0 - Culture, Urine; Future - Culture, Urine   Cathy Peng, NP

## 2020-07-19 NOTE — Telephone Encounter (Signed)
Called pt and lmom for pt to call to schedule an appointment

## 2020-07-21 LAB — URINE CULTURE
MICRO NUMBER:: 10882041
SPECIMEN QUALITY:: ADEQUATE

## 2020-07-22 DIAGNOSIS — Z09 Encounter for follow-up examination after completed treatment for conditions other than malignant neoplasm: Secondary | ICD-10-CM | POA: Diagnosis not present

## 2020-07-22 DIAGNOSIS — N390 Urinary tract infection, site not specified: Secondary | ICD-10-CM | POA: Diagnosis not present

## 2020-07-22 DIAGNOSIS — N958 Other specified menopausal and perimenopausal disorders: Secondary | ICD-10-CM | POA: Diagnosis not present

## 2020-07-27 ENCOUNTER — Other Ambulatory Visit: Payer: Self-pay | Admitting: Adult Health

## 2020-07-29 NOTE — Progress Notes (Signed)
Cathy [name] Phone: [phone] Subjective:   [name], am serving as a scribe for Dr. Hulan Saas. This visit occurred during the SARS-CoV-2 public health emergency.  Safety protocols were in place, including screening questions prior to the visit, additional usage of staff PPE, and extensive cleaning of exam room while observing appropriate contact time as indicated for disinfecting solutions.   I'm seeing this patient by the request  of:  Cathy Peng, NP  CC: Bilateral knee pain, hip pain follow-up  [id number]   06/11/2020 X-rays pending.  May need intra-articular injection at follow-up patient will need surgical intervention at some point  Repeat injection given today.  Discussed icing regimen of home exercise, icing regimen.  Increase activity slowly.  Do believe that patient does have underlying arthritic changes of the left hip that likely will need to be evaluated.  X-rays ordered today.  Discussed medications follow-up again in 4 to 8 weeks if no improvement intra-articular injection  Update 07/30/2020 Cathy [name] is a 74 y.o. female coming in with complaint of left knee and hip pain. Right Total Hip Replacement 04/09/2020. Patient states that on month ago she was getting into car and felt a pop in left knee, lateral aspect. Having pain since then.   Pain in left hip radiates down her leg. Starts in lateral aspect. Wants to have surgery on left hip as well.  Patient knows not ready to have the surgery on the left hip at the moment.  Patient is looking for something to help her with the pain on the lateral aspect.  Xray 03/2019 Left knee IMPRESSION: 1. Progressive now severe lateral compartment osteoarthritis.       Past Medical History:  Diagnosis Date  . Chronic constipation   . Depression   . Diabetes mellitus type II    no longer taking medication  . DJD (degenerative joint disease)      knees  . Female cystocele   . GERD (gastroesophageal reflux disease)   . History of esophagitis   . History of left inguinal hernia   . History of MRSA infection    recurrent carbuncle  . History of recurrent UTIs   . Hyperlipidemia   . Hypertension   . Urgency of urination    Past Surgical History:  Procedure Laterality Date  . APPENDECTOMY  1980's  . BREAST EXCISIONAL BIOPSY Left 1989  . COLONOSCOPY  last one 06-18-2015  . INGUINAL HERNIA REPAIR Left 05-10-2001  . LAPAROSCOPIC LYSIS OF ADHESIONS  01/17/2019  . OOPHORECTOMY Right 01/17/2019  . PERINEOPLASTY  01/17/2019  . SALPINGECTOMY Left 01/17/2019  . TOTAL HIP ARTHROPLASTY Right 04/09/2020   Procedure: RIGH TOTAL HIP ARTHROPLASTY ANTERIOR APPROACH;  Surgeon: Melrose Nakayama, MD;  Location: [facility];  Service: Orthopedics;  Laterality: Right;  [name] VAGINAL HYSTERECTOMY  1980's  . VAGINAL PROLAPSE REPAIR N/A 03/02/2016   Procedure: COLOPLAST ANTERIOR  VAULT REPAIR WITH AXIS DERMIS. SACROSPINUS FIXATION, AUGMENTATION WITH AXIS DERMIS;  Surgeon: Carolan Clines, MD;  Location: [facility];  Service: Urology;  Laterality: N/A;   Social History   Socioeconomic History  . Marital status: Single    Spouse name: Not on file  . Number of children: Not on file  . Years of education: Not on file  . Highest education level: Not on file  Occupational History  . Not on file  Tobacco Use  . Smoking status: Former Smoker    Years:  1.00    Types: Cigarettes    Quit date: 02/28/1996    Years since quitting: 24.4  . Smokeless tobacco: Never Used  Vaping Use  . Vaping Use: Never used  Substance and Sexual Activity  . Alcohol use: Yes    Alcohol/week: 0.0 standard drinks    Comment: occasional  . Drug use: No  . Sexual activity: Not on file  Other Topics Concern  . Not on file  Social History Narrative   Single   Former Smoker  -  quit 10 to 11 years ago (light smoker)   Alcohol use-yes     2 children     Occupation: [employer]    Social Determinants of Health   Financial Resource Strain: Low Risk   . Difficulty of Paying Living Expenses: Not hard at all  Food Insecurity: No Food Insecurity  . Worried About Charity fundraiser in the Last Year: Never true  . Ran Out of Food in the Last Year: Never true  Transportation Needs: No Transportation Needs  . Lack of Transportation (Medical): No  . Lack of Transportation (Non-Medical): No  Physical Activity: Inactive  . Days of Exercise per Week: 0 days  . Minutes of Exercise per Session: 0 min  Stress: No Stress Concern Present  . Feeling of Stress : Not at all  Social Connections: Moderately Isolated  . Frequency of Communication with Friends and Family: More than three times a week  . Frequency of Social Gatherings with Friends and Family: More than three times a week  . Attends Religious Services: More than 4 times per year  . Active Member of Clubs or Organizations: No  . Attends Archivist Meetings: Never  . Marital Status: Never married   Allergies  Allergen Reactions  . Penicillins Hives    Has patient had a PCN reaction causing immediate rash, facial/tongue/throat swelling, SOB or lightheadedness with hypotension: Yes Has patient had a PCN reaction causing severe rash involving mucus membranes or skin necrosis: No Has patient had a PCN reaction that required hospitalization: No Has patient had a PCN reaction occurring within the last 10 years: No If all of the above answers are "NO", then may proceed with Cephalosporin use.    Family History  Problem Relation Age of Onset  . Aneurysm Mother 28       deceased secondary to brain aneurysm  . Liver cancer Father 69       deceased  . Diabetes Other        grandmother  . Colon cancer Neg Hx   . Stomach cancer Neg Hx   . Rectal cancer Neg Hx   . Esophageal cancer Neg Hx   . Breast cancer Neg Hx      Current Outpatient Medications (Cardiovascular):   .  simvastatin (ZOCOR) 40 MG tablet, TAKE 1 TABLET BY MOUTH EVERY DAY AT 6 PM .  lisinopril (ZESTRIL) 10 MG tablet, TAKE 1 TABLET(10 MG) BY MOUTH DAILY  Current Outpatient Medications (Respiratory):  .  fexofenadine (ALLEGRA) 180 MG tablet, Take 180 mg by mouth daily. .  fluticasone (FLONASE) 50 MCG/ACT nasal spray, Place 2 sprays into both nostrils daily.  Current Outpatient Medications (Analgesics):  .  aspirin 81 MG tablet, Take 1 tablet (81 mg total) by mouth 2 (two) times daily after a meal.  Current Outpatient Medications (Hematological):  [name]  Cyanocobalamin (VITAMIN B-12) 2500 MCG SUBL, Place 2,500 mcg under the tongue daily.  Current Outpatient  Medications (Other):  [name]  Ascorbic Acid (VITAMIN C) 1000 MG tablet, Take 1,000 mg by mouth daily. [name]  b complex vitamins capsule, Take 1 capsule by mouth daily. .  Biotin 1000 MCG tablet, Take 1,000 mcg by mouth daily. .  blood glucose meter kit and supplies KIT, Dispense based on patient and insurance preference. Use once daily to test blood glucose. (FOR ICD-E11.9). .  Calcium Carbonate-Vitamin D (CALTRATE 600+D) 600-400 MG-UNIT per tablet, Take 1 tablet by mouth daily.   .  Cinnamon 500 MG capsule, Take 1,000 mg by mouth daily. [name]  CRANBERRY PO, Take 2 tablets by mouth daily. 4200 mg .  estradiol (ESTRACE) 0.1 MG/GM vaginal cream, Place 1 Applicatorful vaginally every other day. In the evening .  Misc Natural Products (TART CHERRY ADVANCED) CAPS, Take 2 capsules by mouth daily.  .  Multiple Vitamins-Minerals (MULTIVITAMIN WITH MINERALS) tablet, Take 1 tablet by mouth daily. Centrum silver .  ONETOUCH DELICA LANCETS FINE MISC, USE TO CHECK BLOOD SUGAR ONCE DAILY .  ONETOUCH VERIO test strip, USE TO TEST BLOOD GLUCOSE TWICE DAILY .  OVER THE COUNTER MEDICATION, Take 1 tablet by mouth daily as needed (constipation). Vital Lax .  polyethylene glycol (MIRALAX / GLYCOLAX) packet, Take 17 g by mouth daily as needed for mild constipation. .   traZODone (DESYREL) 50 MG tablet, TAKE 1/2 TO 1 TABLET(25 TO 50 MG) BY MOUTH AT BEDTIME AS NEEDED FOR SLEEP .  trimethoprim (TRIMPEX) 100 MG tablet, Take 100 mg by mouth as needed. .  Turmeric 500 MG CAPS, Take 1,000 mg by mouth daily.  [name]  gabapentin (NEURONTIN) 100 MG capsule, Take 2 capsules (200 mg total) by mouth at bedtime.   Reviewed prior external information including notes and imaging from  primary care provider As well as notes that were available from care everywhere and other healthcare systems.  Past medical history, social, surgical and family history all reviewed in electronic medical record.  No pertanent information unless stated regarding to the chief complaint.   Review of Systems:  No headache, visual changes, nausea, vomiting, diarrhea, constipation, dizziness, abdominal pain, skin rash, fevers, chills, night sweats, weight loss, swollen lymph nodes, body aches, joint swelling, chest pain, shortness of breath, mood changes. POSITIVE muscle aches  Objective  Blood pressure 138/82, pulse 70, height 5' 4" (1.626 m), weight 143 lb (64.9 kg), SpO2 99 %.   General: No apparent distress alert and oriented x3 mood and affect normal, dressed appropriately.  HEENT: Pupils equal, extraocular movements intact  Respiratory: Patient's speak in full sentences and does not appear short of breath  Cardiovascular: No lower extremity edema, non tender, no erythema  Neuro: Cranial nerves II through XII are intact, neurovascularly intact in all extremities with 2+ DTRs and 2+ pulses.  Gait antalgic Left hip exam has significant decreased range of motion in all planes.  Tender to palpation over the greater trochanteric area left side more than right.  Negative straight leg test.  4-5 strength near symmetric to the contralateral side  Knee: Left valgus deformity noted.  Abnormal thigh to calf ratio.  Tender to palpation over medial and PF joint line.  ROM full in flexion and extension and  lower leg rotation. instability with valgus force.  painful patellar compression. Patellar glide with moderate crepitus. Patellar and quadriceps tendons unremarkable. Hamstring and quadriceps strength is normal. Contralateral knee shows arthritic changes but no instability  After verbal consent patient was prepped in sterile fashion with alcohol swabs. Ethyl chloride  used patient was injected with a 22-gauge 3 inch needle into the left lateral hip in the greater trochanteric area under ultrasound guidance. Picture was taken. Patient had 4 cc of 0.5% Marcaine and 1 cc of Kenalog 40 mg/dL injected. Patient tolerated the procedure well and no blood loss. Pain completely resolved after injection stating proper placement. Post injection instructions given.  After informed written and verbal consent, patient was seated on exam table. Left knee was prepped with alcohol swab and utilizing anterolateral approach, patient's left knee space was injected with 4:1  marcaine 0.5%: Kenalog 64m/dL. Patient tolerated the procedure well without immediate complications.   Impression and Recommendations:     The above documentation has been reviewed and is accurate and complete ZLyndal Pulley DO       Note: This dictation was prepared with Dragon dictation along with smaller phrase technology. Any transcriptional errors that result from this process are unintentional.

## 2020-07-30 ENCOUNTER — Other Ambulatory Visit: Payer: Self-pay

## 2020-07-30 ENCOUNTER — Encounter: Payer: Self-pay | Admitting: Family Medicine

## 2020-07-30 ENCOUNTER — Ambulatory Visit (INDEPENDENT_AMBULATORY_CARE_PROVIDER_SITE_OTHER): Payer: Medicare Other | Admitting: Adult Health

## 2020-07-30 ENCOUNTER — Ambulatory Visit (INDEPENDENT_AMBULATORY_CARE_PROVIDER_SITE_OTHER): Payer: Medicare Other | Admitting: Family Medicine

## 2020-07-30 ENCOUNTER — Encounter: Payer: Self-pay | Admitting: Adult Health

## 2020-07-30 VITALS — BP 118/80 | HR 81 | Temp 98.1°F | Ht 64.0 in | Wt 141.0 lb

## 2020-07-30 DIAGNOSIS — M170 Bilateral primary osteoarthritis of knee: Secondary | ICD-10-CM | POA: Diagnosis not present

## 2020-07-30 DIAGNOSIS — E785 Hyperlipidemia, unspecified: Secondary | ICD-10-CM | POA: Diagnosis not present

## 2020-07-30 DIAGNOSIS — E118 Type 2 diabetes mellitus with unspecified complications: Secondary | ICD-10-CM

## 2020-07-30 DIAGNOSIS — Z23 Encounter for immunization: Secondary | ICD-10-CM

## 2020-07-30 DIAGNOSIS — I10 Essential (primary) hypertension: Secondary | ICD-10-CM

## 2020-07-30 DIAGNOSIS — Z0000 Encounter for general adult medical examination without abnormal findings: Secondary | ICD-10-CM | POA: Diagnosis not present

## 2020-07-30 DIAGNOSIS — M7062 Trochanteric bursitis, left hip: Secondary | ICD-10-CM

## 2020-07-30 MED ORDER — GABAPENTIN 100 MG PO CAPS: 200.0000 mg | ORAL_CAPSULE | Freq: Every day | ORAL | 0 refills | Status: DC

## 2020-07-30 MED ORDER — LISINOPRIL 10 MG PO TABS: ORAL_TABLET | ORAL | 3 refills | Status: DC

## 2020-07-30 NOTE — Patient Instructions (Addendum)
Gabapentin 100mg  at night can bump up to 200mg  if needed Iron 65 mg with 500mg  Vit C See me again in 3 months

## 2020-07-30 NOTE — Assessment & Plan Note (Signed)
Repeat injection given today, started on gabapentin, no other arthritic changes of the hip and is working on replacement.  Discussed iron supplementation.  Follow-up again in 4 to 8 weeks

## 2020-07-30 NOTE — Assessment & Plan Note (Signed)
Injection given today, tolerated the procedure well, discussed icing regimen and home exercise, discussed avoiding certain activities, increase activity slowly.  Follow-up with me again in 4 to 8 weeks.

## 2020-07-30 NOTE — Progress Notes (Signed)
Subjective:    Patient ID: Cathy [name], female    DOB: February 12, 1946, 74 y.o.   MRN: [id number]  HPI Patient presents for yearly preventative medicine examination. She is a pleasant 74 year old female who  has a past medical history of Chronic constipation, Depression, Diabetes mellitus type II, DJD (degenerative joint disease), Female cystocele, GERD (gastroesophageal reflux disease), History of esophagitis, History of left inguinal hernia, History of MRSA infection, History of recurrent UTIs, Hyperlipidemia, Hypertension, and Urgency of urination.  DM II -currently diet controlled.  She does not monitor blood sugars on a routine basis but when she does check them she reports readings less than 120.  She denies hypoglycemic events  Lab Results  Component Value Date   HGBA1C 6.6 (H) 04/02/2020   Hypertension -takes lisinopril 10 mg daily.  She denies dizziness, lightheadedness, chest pain, or shortness of breath BP Readings from Last 3 Encounters:  07/30/20 118/80  07/30/20 138/82  07/19/20 140/80   Hyperlipidemia -takes simvastatin 40 mg daily.  She denies myalgia or fatigue  Lab Results  Component Value Date   CHOL 205 (H) 07/28/2019   HDL 68.50 07/28/2019   LDLCALC 122 (H) 07/28/2019   LDLDIRECT 118.2 11/21/2013   TRIG 72.0 07/28/2019   CHOLHDL 3 07/28/2019   Osteoarthritis -received with steroid injection in the left knee earlier today at sports medicine.  Will likely need knee replacement in the future.  He was also placed on gabapentin 200 mg at bedtime today.  All immunizations and health maintenance protocols were reviewed with the patient and needed orders were placed.  Appropriate screening laboratory values were ordered for the patient including screening of hyperlipidemia, renal function and hepatic function. If indicated by BPH, a PSA was ordered.  Medication reconciliation,  past medical history, social history, problem list and allergies were reviewed in  detail with the patient  Goals were established with regard to weight loss, exercise, and  diet in compliance with medications Wt Readings from Last 3 Encounters:  07/30/20 141 lb (64 kg)  07/30/20 143 lb (64.9 kg)  07/19/20 144 lb (65.3 kg)   Review of Systems  Constitutional: Negative.   HENT: Negative.   Eyes: Negative.   Respiratory: Negative.   Cardiovascular: Negative.   Gastrointestinal: Negative.   Endocrine: Negative.   Genitourinary: Negative.   Musculoskeletal: Positive for arthralgias.  Skin: Negative.   Allergic/Immunologic: Negative.   Neurological: Negative.   Hematological: Negative.   Psychiatric/Behavioral: Negative.    Past Medical History:  Diagnosis Date   Chronic constipation    Depression    Diabetes mellitus type II    no longer taking medication   DJD (degenerative joint disease)    knees   Female cystocele    GERD (gastroesophageal reflux disease)    History of esophagitis    History of left inguinal hernia    History of MRSA infection    recurrent carbuncle   History of recurrent UTIs    Hyperlipidemia    Hypertension    Urgency of urination     Social History   Socioeconomic History   Marital status: Single    Spouse name: Not on file   Number of children: Not on file   Years of education: Not on file   Highest education level: Not on file  Occupational History   Not on file  Tobacco Use   Smoking status: Former Smoker    Years: 1.00    Types: Cigarettes    Quit  date: 02/28/1996    Years since quitting: 24.4   Smokeless tobacco: Never Used  Vaping Use   Vaping Use: Never used  Substance and Sexual Activity   Alcohol use: Yes    Alcohol/week: 0.0 standard drinks    Comment: occasional   Drug use: No   Sexual activity: Not on file  Other Topics Concern   Not on file  Social History Narrative   Single   Former Smoker  -  quit 10 to 11 years ago (light smoker)   Alcohol use-yes     2 children     Occupation: [employer]    Social Determinants of Health   Financial Resource Strain: Low Risk    Difficulty of Paying Living Expenses: Not hard at all  Food Insecurity: No Food Insecurity   Worried About Charity fundraiser in the Last Year: Never true   Arboriculturist in the Last Year: Never true  Transportation Needs: No Transportation Needs   Lack of Transportation (Medical): No   Lack of Transportation (Non-Medical): No  Physical Activity: Inactive   Days of Exercise per Week: 0 days   Minutes of Exercise per Session: 0 min  Stress: No Stress Concern Present   Feeling of Stress : Not at all  Social Connections: Moderately Isolated   Frequency of Communication with Friends and Family: More than three times a week   Frequency of Social Gatherings with Friends and Family: More than three times a week   Attends Religious Services: More than 4 times per year   Active Member of Clubs or Organizations: No   Attends Archivist Meetings: Never   Marital Status: Never married  Human resources officer Violence: Not At Risk   Fear of Current or Ex-Partner: No   Emotionally Abused: No   Physically Abused: No   Sexually Abused: No    Past Surgical History:  Procedure Laterality Date   APPENDECTOMY  1980's   BREAST EXCISIONAL BIOPSY Left 1989   COLONOSCOPY  last one 06-18-2015   INGUINAL HERNIA REPAIR Left 05-10-2001   LAPAROSCOPIC LYSIS OF ADHESIONS  01/17/2019   OOPHORECTOMY Right 01/17/2019   PERINEOPLASTY  01/17/2019   SALPINGECTOMY Left 01/17/2019   TOTAL HIP ARTHROPLASTY Right 04/09/2020   Procedure: RIGH TOTAL HIP ARTHROPLASTY ANTERIOR APPROACH;  Surgeon: Melrose Nakayama, MD;  Location: [facility];  Service: Orthopedics;  Laterality: Right;   VAGINAL HYSTERECTOMY  1980's   VAGINAL PROLAPSE REPAIR N/A 03/02/2016   Procedure: COLOPLAST ANTERIOR  VAULT REPAIR WITH AXIS DERMIS. SACROSPINUS FIXATION, AUGMENTATION WITH AXIS DERMIS;   Surgeon: Carolan Clines, MD;  Location: [facility];  Service: Urology;  Laterality: N/A;    Family History  Problem Relation Age of Onset   Aneurysm Mother 43       deceased secondary to brain aneurysm   Liver cancer Father 53       deceased   Diabetes Other        grandmother   Colon cancer Neg Hx    Stomach cancer Neg Hx    Rectal cancer Neg Hx    Esophageal cancer Neg Hx    Breast cancer Neg Hx     Allergies  Allergen Reactions   Penicillins Hives    Has patient had a PCN reaction causing immediate rash, facial/tongue/throat swelling, SOB or lightheadedness with hypotension: Yes Has patient had a PCN reaction causing severe rash involving mucus membranes or skin necrosis: No Has patient had a  PCN reaction that required hospitalization: No Has patient had a PCN reaction occurring within the last 10 years: No If all of the above answers are "NO", then may proceed with Cephalosporin use.     Current Outpatient Medications on File Prior to Visit  Medication Sig Dispense Refill   Ascorbic Acid (VITAMIN C) 1000 MG tablet Take 1,000 mg by mouth daily.     aspirin 81 MG tablet Take 1 tablet (81 mg total) by mouth 2 (two) times daily after a meal. 60 tablet 0   b complex vitamins capsule Take 1 capsule by mouth daily.     Biotin 1000 MCG tablet Take 1,000 mcg by mouth daily.     blood glucose meter kit and supplies KIT Dispense based on patient and insurance preference. Use once daily to test blood glucose. (FOR ICD-E11.9). 1 each 0   Calcium Carbonate-Vitamin D (CALTRATE 600+D) 600-400 MG-UNIT per tablet Take 1 tablet by mouth daily.       Cinnamon 500 MG capsule Take 1,000 mg by mouth daily.     CRANBERRY PO Take 2 tablets by mouth daily. 4200 mg     Cyanocobalamin (VITAMIN B-12) 2500 MCG SUBL Place 2,500 mcg under the tongue daily.     estradiol (ESTRACE) 0.1 MG/GM vaginal cream Place 1 Applicatorful vaginally every other day. In the evening   3   fexofenadine (ALLEGRA) 180 MG tablet Take 180 mg by mouth daily.     fluticasone (FLONASE) 50 MCG/ACT nasal spray Place 2 sprays into both nostrils daily.     Misc Natural Products (TART CHERRY ADVANCED) CAPS Take 2 capsules by mouth daily.      Multiple Vitamins-Minerals (MULTIVITAMIN WITH MINERALS) tablet Take 1 tablet by mouth daily. Centrum silver     ONETOUCH DELICA LANCETS FINE MISC USE TO CHECK BLOOD SUGAR ONCE DAILY 100 each 3   ONETOUCH VERIO test strip USE TO TEST BLOOD GLUCOSE TWICE DAILY 200 strip 3   OVER THE COUNTER MEDICATION Take 1 tablet by mouth daily as needed (constipation). Vital Lax     polyethylene glycol (MIRALAX / GLYCOLAX) packet Take 17 g by mouth daily as needed for mild constipation.     simvastatin (ZOCOR) 40 MG tablet TAKE 1 TABLET BY MOUTH EVERY DAY AT 6 PM 90 tablet 0   traZODone (DESYREL) 50 MG tablet TAKE 1/2 TO 1 TABLET(25 TO 50 MG) BY MOUTH AT BEDTIME AS NEEDED FOR SLEEP 30 tablet 3   trimethoprim (TRIMPEX) 100 MG tablet Take 100 mg by mouth as needed.     Turmeric 500 MG CAPS Take 1,000 mg by mouth daily.      [DISCONTINUED] valsartan (DIOVAN) 160 MG tablet Take 1/2 tablet by mouth once daily 30 tablet 5   No current facility-administered medications on file prior to visit.    BP 118/80 (BP Location: Left Arm, Patient Position: Sitting, Cuff Size: Normal)    Pulse 81    Temp 98.1 F (36.7 C) (Oral)    Ht _0  (1.626 m)    Wt 141 lb (64 kg)    SpO2 95%    BMI 24.20 kg/m       Objective:   Physical Exam Vitals and nursing note reviewed.  Constitutional:      General: She is not in acute distress.    Appearance: Normal appearance. She is well-developed. She is not ill-appearing.  HENT:     Head: Normocephalic and atraumatic.     Right Ear: Tympanic membrane, ear canal  and external ear normal. There is no impacted cerumen.     Left Ear: Tympanic membrane, ear canal and external ear normal. There is no impacted cerumen.     Nose:  Nose normal. No congestion or rhinorrhea.     Mouth/Throat:     Mouth: Mucous membranes are moist.     Pharynx: Oropharynx is clear. No oropharyngeal exudate or posterior oropharyngeal erythema.  Eyes:     General:        Right eye: No discharge.        Left eye: No discharge.     Extraocular Movements: Extraocular movements intact.     Conjunctiva/sclera: Conjunctivae normal.     Pupils: Pupils are equal, round, and reactive to light.  Neck:     Thyroid: No thyromegaly.     Vascular: No carotid bruit.     Trachea: No tracheal deviation.  Cardiovascular:     Rate and Rhythm: Normal rate and regular rhythm.     Pulses: Normal pulses.     Heart sounds: Normal heart sounds. No murmur heard.  No friction rub. No gallop.   Pulmonary:     Effort: Pulmonary effort is normal. No respiratory distress.     Breath sounds: Normal breath sounds. No stridor. No wheezing, rhonchi or rales.  Chest:     Chest wall: No tenderness.  Abdominal:     General: Abdomen is flat. Bowel sounds are normal. There is no distension.     Palpations: Abdomen is soft. There is no mass.     Tenderness: There is no abdominal tenderness. There is no right CVA tenderness, left CVA tenderness, guarding or rebound.     Hernia: No hernia is present.  Musculoskeletal:        General: No swelling, tenderness, deformity or signs of injury. Normal range of motion.     Cervical back: Normal range of motion and neck supple.     Right lower leg: No edema.     Left lower leg: No edema.  Lymphadenopathy:     Cervical: No cervical adenopathy.  Skin:    General: Skin is warm and dry.     Coloration: Skin is not jaundiced or pale.     Findings: No bruising, erythema, lesion or rash.  Neurological:     General: No focal deficit present.     Mental Status: She is alert and oriented to person, place, and time.     Cranial Nerves: No cranial nerve deficit.     Sensory: No sensory deficit.     Motor: No weakness.      Coordination: Coordination normal.     Gait: Gait abnormal (limping gait).     Deep Tendon Reflexes: Reflexes normal.  Psychiatric:        Mood and Affect: Mood normal.        Behavior: Behavior normal.        Thought Content: Thought content normal.        Judgment: Judgment normal.        Assessment & Plan:  1. Routine general medical examination at a health care facility  - CMP with eGFR([facility]); Future - Iron, TIBC and Ferritin Panel; Future - CBC with Differential/Platelet; Future - Hemoglobin A1c; Future - Lipid panel; Future - TSH; Future - TSH - Lipid panel - Hemoglobin A1c - CBC with Differential/Platelet - Iron, TIBC and Ferritin Panel - CMP with eGFR([facility])  2. Essential hypertension - No change in medication. BP well controlled.  - Urinalysis,  Routine w reflex microscopic; Future - CMP with eGFR([facility]); Future - Iron, TIBC and Ferritin Panel; Future - CBC with Differential/Platelet; Future - Hemoglobin A1c; Future - Lipid panel; Future - TSH; Future - TSH - Lipid panel - Hemoglobin A1c - CBC with Differential/Platelet - Iron, TIBC and Ferritin Panel - CMP with eGFR([facility]) - Urinalysis, Routine w reflex microscopic  3. Primary osteoarthritis of both knees - Follow up with sportsmedicine/ortho as directed - CMP with eGFR([facility]); Future - Iron, TIBC and Ferritin Panel; Future - CBC with Differential/Platelet; Future - Hemoglobin A1c; Future - Lipid panel; Future - Lipid panel - Hemoglobin A1c - CBC with Differential/Platelet - Iron, TIBC and Ferritin Panel - CMP with eGFR([facility])  4. Dyslipidemia - Consider higher intensity statin  - CMP with eGFR([facility]); Future - Iron, TIBC and Ferritin Panel; Future - CBC with Differential/Platelet; Future - Hemoglobin A1c; Future - Lipid panel; Future - TSH; Future - Lipid panel - Hemoglobin A1c - CBC with Differential/Platelet - Iron, TIBC and Ferritin Panel - CMP with eGFR([facility])  5. Controlled type  2 diabetes mellitus with complication, without long-term current use of insulin ([place]) - Consider metformin  - Urinalysis, Routine w reflex microscopic; Future - CMP with eGFR([facility]); Future - Iron, TIBC and Ferritin Panel; Future - CBC with Differential/Platelet; Future - Hemoglobin A1c; Future - Lipid panel; Future - TSH; Future - TSH - Lipid panel - Hemoglobin A1c - CBC with Differential/Platelet - Iron, TIBC and Ferritin Panel - CMP with eGFR([facility]) - Urinalysis, Routine w reflex microscopic  6. Need for immunization against influenza  - Flu Vaccine QUAD High Dose(Fluad)  [name], NP

## 2020-07-30 NOTE — Patient Instructions (Signed)
It was great seeing you today   We will follow up with you regarding your blood work   Please let me know if you need anything    

## 2020-07-31 LAB — CBC WITH DIFFERENTIAL/PLATELET
Absolute Monocytes: 578 cells/uL (ref 200–950)
Basophils Absolute: 51 cells/uL (ref 0–200)
Basophils Relative: 0.6 %
Eosinophils Absolute: 128 cells/uL (ref 15–500)
Eosinophils Relative: 1.5 %
HCT: 43.4 % (ref 35.0–45.0)
Hemoglobin: 13.4 g/dL (ref 11.7–15.5)
Lymphs Abs: 2168 cells/uL (ref 850–3900)
MCH: 24.1 pg — ABNORMAL LOW (ref 27.0–33.0)
MCHC: 30.9 g/dL — ABNORMAL LOW (ref 32.0–36.0)
MCV: 78.1 fL — ABNORMAL LOW (ref 80.0–100.0)
MPV: 10.2 fL (ref 7.5–12.5)
Monocytes Relative: 6.8 %
Neutro Abs: 5576 cells/uL (ref 1500–7800)
Neutrophils Relative %: 65.6 %
Platelets: 379 10*3/uL (ref 140–400)
RBC: 5.56 10*6/uL — ABNORMAL HIGH (ref 3.80–5.10)
RDW: 16.4 % — ABNORMAL HIGH (ref 11.0–15.0)
Total Lymphocyte: 25.5 %
WBC: 8.5 10*3/uL (ref 3.8–10.8)

## 2020-07-31 LAB — IRON,TIBC AND FERRITIN PANEL
%SAT: 21 % (calc) (ref 16–45)
Ferritin: 20 ng/mL (ref 16–288)
Iron: 76 ug/dL (ref 45–160)
TIBC: 369 mcg/dL (calc) (ref 250–450)

## 2020-07-31 LAB — COMPLETE METABOLIC PANEL WITH GFR
AG Ratio: 1.8 (calc) (ref 1.0–2.5)
ALT: 13 U/L (ref 6–29)
AST: 19 U/L (ref 10–35)
Albumin: 4.5 g/dL (ref 3.6–5.1)
Alkaline phosphatase (APISO): 76 U/L (ref 37–153)
BUN: 15 mg/dL (ref 7–25)
CO2: 28 mmol/L (ref 20–32)
Calcium: 10.5 mg/dL — ABNORMAL HIGH (ref 8.6–10.4)
Chloride: 101 mmol/L (ref 98–110)
Creat: 0.79 mg/dL (ref 0.60–0.93)
GFR, Est African American: 87 mL/min/{1.73_m2} (ref >=60)
GFR, Est Non African American: 75 mL/min/{1.73_m2} (ref >=60)
Globulin: 2.5 g/dL (calc) (ref 1.9–3.7)
Glucose, Bld: 109 mg/dL — ABNORMAL HIGH (ref 65–99)
Potassium: 4.3 mmol/L (ref 3.5–5.3)
Sodium: 139 mmol/L (ref 135–146)
Total Bilirubin: 0.4 mg/dL (ref 0.2–1.2)
Total Protein: 7 g/dL (ref 6.1–8.1)

## 2020-07-31 LAB — URINALYSIS, ROUTINE W REFLEX MICROSCOPIC
Bacteria, UA: NONE SEEN /HPF
Bilirubin Urine: NEGATIVE
Glucose, UA: NEGATIVE
Hgb urine dipstick: NEGATIVE
Hyaline Cast: NONE SEEN /LPF
Ketones, ur: NEGATIVE
Nitrite: NEGATIVE
Protein, ur: NEGATIVE
RBC / HPF: NONE SEEN /HPF (ref 0–2)
Specific Gravity, Urine: 1.009 (ref 1.001–1.03)
Squamous Epithelial / HPF: >=28 /HPF — AB (ref <=5)
pH: 7.5 (ref 5.0–8.0)

## 2020-07-31 LAB — LIPID PANEL
Cholesterol: 238 mg/dL — ABNORMAL HIGH (ref <200)
HDL: 75 mg/dL (ref >=50)
LDL Cholesterol (Calc): 139 mg/dL (calc) — ABNORMAL HIGH
Non-HDL Cholesterol (Calc): 163 mg/dL (calc) — ABNORMAL HIGH (ref <130)
Total CHOL/HDL Ratio: 3.2 (calc) (ref <5.0)
Triglycerides: 119 mg/dL (ref <150)

## 2020-07-31 LAB — HEMOGLOBIN A1C
Hgb A1c MFr Bld: 6.3 % of total Hgb — ABNORMAL HIGH (ref <5.7)
Mean Plasma Glucose: 134 (calc)
eAG (mmol/L): 7.4 (calc)

## 2020-07-31 LAB — TSH: TSH: 1.03 mIU/L (ref 0.40–4.50)

## 2020-07-31 MED ORDER — ROSUVASTATIN CALCIUM 40 MG PO TABS: 40.0000 mg | ORAL_TABLET | Freq: Every day | ORAL | 1 refills | Status: DC

## 2020-07-31 NOTE — Addendum Note (Signed)
Addended by: Agnes Lawrence on: 07/31/2020 02:44 PM   Modules accepted: Orders

## 2020-08-18 ENCOUNTER — Other Ambulatory Visit: Payer: Self-pay | Admitting: Adult Health

## 2020-08-31 DIAGNOSIS — Z471 Aftercare following joint replacement surgery: Secondary | ICD-10-CM | POA: Diagnosis not present

## 2020-08-31 DIAGNOSIS — Z96641 Presence of right artificial hip joint: Secondary | ICD-10-CM | POA: Diagnosis not present

## 2020-10-01 DIAGNOSIS — M1612 Unilateral primary osteoarthritis, left hip: Secondary | ICD-10-CM | POA: Diagnosis not present

## 2020-10-09 ENCOUNTER — Other Ambulatory Visit: Payer: Self-pay | Admitting: Adult Health

## 2020-10-09 DIAGNOSIS — E785 Hyperlipidemia, unspecified: Secondary | ICD-10-CM

## 2020-10-15 ENCOUNTER — Telehealth: Payer: Self-pay | Admitting: Family Medicine

## 2020-10-15 ENCOUNTER — Other Ambulatory Visit: Payer: Self-pay | Admitting: Adult Health

## 2020-10-15 ENCOUNTER — Other Ambulatory Visit: Payer: Self-pay

## 2020-10-15 MED ORDER — PREDNISONE 20 MG PO TABS: 20.0000 mg | ORAL_TABLET | Freq: Every day | ORAL | 0 refills | Status: DC

## 2020-10-15 NOTE — Telephone Encounter (Signed)
likely flare of knee arthritis.  Will need injections when I see her.  Consider voltaren gel or arnica lotion over the counter.  Only other thing would be short course of prednisone but I am pretty sure she does not like that medicine and we do need to watch her blood sugars some

## 2020-10-15 NOTE — Telephone Encounter (Signed)
Patient called stating that she is having a lot of knee pain. She asked if there was anything Dr Tamala Julian could recommend for her? She is scheduled for a follow up with him on December 7th.

## 2020-10-15 NOTE — Telephone Encounter (Signed)
Spoke with patient. She would like to try prednisone. Told patient to not take NSAIDs with prednisone. Patient voices understanding.

## 2020-10-25 ENCOUNTER — Other Ambulatory Visit: Payer: Self-pay | Admitting: Family Medicine

## 2020-10-28 NOTE — Progress Notes (Signed)
[name] Phone: [phone] Subjective:   [name], am serving as a scribe for Dr. Hulan Saas. This visit occurred during the SARS-CoV-2 public health emergency.  Safety protocols were in place, including screening questions prior to the visit, additional usage of staff PPE, and extensive cleaning of exam room while observing appropriate contact time as indicated for disinfecting solutions.   I'm seeing this patient by the request  of:  [name], NP  CC: Knee pain follow-up  WGN:[id number]   07/30/2020 Injection given today, tolerated the procedure well, discussed icing regimen and home exercise, discussed avoiding certain activities, increase activity slowly.  Follow-up with me again in 4 to 8 weeks.  Repeat injection given today, started on gabapentin, no other arthritic changes of the hip and is working on replacement.  Discussed iron supplementation.  Follow-up again in 4 to 8 weeks  Update 10/28/2020 ICELYNN ONKEN is a 74 y.o. female coming in with complaint of left hip and bilateral knee pain. Patient states that her fingers are still having numbness and tingling that is worse at night. Gabapentin seems to give her headaches. Patient is still having pretty bad L hip pain, but is having trouble with both shoulders especially at night where it is sharp pain. Has not found anything to help with her pain. Aleve, CBD ointment and did not help.  States that the left knee seems to be the worse.  States that it is severely tender.  Some increase in instability.  Patient's x-rays of the knee were last in 2020 with the left knee showing severe lateral compartment osteoarthritic changes last injection in September.  Patient did have a hip replacement back in May 2021  Past Medical History:  Diagnosis Date  . Chronic constipation   . Depression   . Diabetes mellitus type II    no longer taking medication   . DJD (degenerative joint disease)    knees  . Female cystocele   . GERD (gastroesophageal reflux disease)   . History of esophagitis   . History of left inguinal hernia   . History of MRSA infection    recurrent carbuncle  . History of recurrent UTIs   . Hyperlipidemia   . Hypertension   . Urgency of urination    Past Surgical History:  Procedure Laterality Date  . APPENDECTOMY  1980's  . BREAST EXCISIONAL BIOPSY Left 1989  . COLONOSCOPY  last one 06-18-2015  . INGUINAL HERNIA REPAIR Left 05-10-2001  . LAPAROSCOPIC LYSIS OF ADHESIONS  01/17/2019  . OOPHORECTOMY Right 01/17/2019  . PERINEOPLASTY  01/17/2019  . SALPINGECTOMY Left 01/17/2019  . TOTAL HIP ARTHROPLASTY Right 04/09/2020   Procedure: RIGH TOTAL HIP ARTHROPLASTY ANTERIOR APPROACH;  Surgeon: Melrose Nakayama, MD;  Location: [facility];  Service: Orthopedics;  Laterality: Right;  [name] VAGINAL HYSTERECTOMY  1980's  . VAGINAL PROLAPSE REPAIR N/A 03/02/2016   Procedure: COLOPLAST ANTERIOR  VAULT REPAIR WITH AXIS DERMIS. SACROSPINUS FIXATION, AUGMENTATION WITH AXIS DERMIS;  Surgeon: Carolan Clines, MD;  Location: [facility];  Service: Urology;  Laterality: N/A;   Social History   Socioeconomic History  . Marital status: Single    Spouse name: Not on file  . Number of children: Not on file  . Years of education: Not on file  . Highest education level: Not on file  Occupational History  . Not on file  Tobacco Use  . Smoking status: Former Smoker  Years: 1.00    Types: Cigarettes    Quit date: 02/28/1996    Years since quitting: 24.6  . Smokeless tobacco: Never Used  Vaping Use  . Vaping Use: Never used  Substance and Sexual Activity  . Alcohol use: Yes    Alcohol/week: 0.0 standard drinks    Comment: occasional  . Drug use: No  . Sexual activity: Not on file  Other Topics Concern  . Not on file  Social History Narrative   Single   Former Smoker  -  quit 10 to 11 years ago (light smoker)    Alcohol use-yes     2 children    Occupation: [employer]    Social Determinants of Health   Financial Resource Strain: Low Risk   . Difficulty of Paying Living Expenses: Not hard at all  Food Insecurity: No Food Insecurity  . Worried About Charity fundraiser in the Last Year: Never true  . Ran Out of Food in the Last Year: Never true  Transportation Needs: No Transportation Needs  . Lack of Transportation (Medical): No  . Lack of Transportation (Non-Medical): No  Physical Activity: Inactive  . Days of Exercise per Week: 0 days  . Minutes of Exercise per Session: 0 min  Stress: No Stress Concern Present  . Feeling of Stress : Not at all  Social Connections: Moderately Isolated  . Frequency of Communication with Friends and Family: More than three times a week  . Frequency of Social Gatherings with Friends and Family: More than three times a week  . Attends Religious Services: More than 4 times per year  . Active Member of Clubs or Organizations: No  . Attends Archivist Meetings: Never  . Marital Status: Never married   Allergies  Allergen Reactions  . Penicillins Hives    Has patient had a PCN reaction causing immediate rash, facial/tongue/throat swelling, SOB or lightheadedness with hypotension: Yes Has patient had a PCN reaction causing severe rash involving mucus membranes or skin necrosis: No Has patient had a PCN reaction that required hospitalization: No Has patient had a PCN reaction occurring within the last 10 years: No If all of the above answers are "NO", then may proceed with Cephalosporin use.    Family History  Problem Relation Age of Onset  . Aneurysm Mother 27       deceased secondary to brain aneurysm  . Liver cancer Father 32       deceased  . Diabetes Other        grandmother  . Colon cancer Neg Hx   . Stomach cancer Neg Hx   . Rectal cancer Neg Hx   . Esophageal cancer Neg Hx   . Breast cancer Neg Hx     Current  Outpatient Medications (Endocrine & Metabolic):  .  predniSONE (DELTASONE) 20 MG tablet, Take 1 tablet (20 mg total) by mouth daily with breakfast.  Current Outpatient Medications (Cardiovascular):  .  lisinopril (ZESTRIL) 10 MG tablet, TAKE 1 TABLET(10 MG) BY MOUTH DAILY .  rosuvastatin (CRESTOR) 40 MG tablet, Take 1 tablet (40 mg total) by mouth daily.  Current Outpatient Medications (Respiratory):  .  fexofenadine (ALLEGRA) 180 MG tablet, Take 180 mg by mouth daily. .  fluticasone (FLONASE) 50 MCG/ACT nasal spray, SHAKE LIQUID AND USE 2 SPRAYS IN EACH NOSTRIL DAILY  Current Outpatient Medications (Analgesics):  .  aspirin 81 MG tablet, Take 1 tablet (81 mg total) by mouth 2 (two) times  daily after a meal.  Current Outpatient Medications (Hematological):  [name]  Cyanocobalamin (VITAMIN B-12) 2500 MCG SUBL, Place 2,500 mcg under the tongue daily.  Current Outpatient Medications (Other):  [name]  Ascorbic Acid (VITAMIN C) 1000 MG tablet, Take 1,000 mg by mouth daily. [name]  b complex vitamins capsule, Take 1 capsule by mouth daily. .  Biotin 1000 MCG tablet, Take 1,000 mcg by mouth daily. .  blood glucose meter kit and supplies KIT, Dispense based on patient and insurance preference. Use once daily to test blood glucose. (FOR ICD-E11.9). .  Calcium Carbonate-Vitamin D (CALTRATE 600+D) 600-400 MG-UNIT per tablet, Take 1 tablet by mouth daily.   .  Cinnamon 500 MG capsule, Take 1,000 mg by mouth daily. [name]  CRANBERRY PO, Take 2 tablets by mouth daily. 4200 mg .  estradiol (ESTRACE) 0.1 MG/GM vaginal cream, Place 1 Applicatorful vaginally every other day. In the evening .  gabapentin (NEURONTIN) 100 MG capsule, TAKE 2 CAPSULES(200 MG) BY MOUTH AT BEDTIME .  Lancets (ONETOUCH DELICA PLUS [id number]) MISC, USE ONCE DAILY .  Misc Natural Products (TART CHERRY ADVANCED) CAPS, Take 2 capsules by mouth daily.  .  Multiple Vitamins-Minerals (MULTIVITAMIN WITH MINERALS) tablet, Take 1 tablet by mouth daily.  Centrum silver .  ONETOUCH VERIO test strip, USE TO TEST BLOOD GLUCOSE TWICE DAILY .  OVER THE COUNTER MEDICATION, Take 1 tablet by mouth daily as needed (constipation). Vital Lax .  polyethylene glycol (MIRALAX / GLYCOLAX) packet, Take 17 g by mouth daily as needed for mild constipation. .  traZODone (DESYREL) 50 MG tablet, TAKE 1/2 TO 1 TABLET(25 TO 50 MG) BY MOUTH AT BEDTIME AS NEEDED FOR SLEEP .  trimethoprim (TRIMPEX) 100 MG tablet, Take 100 mg by mouth as needed. .  Turmeric 500 MG CAPS, Take 1,000 mg by mouth daily.    Reviewed prior external information including notes and imaging from  primary care provider As well as notes that were available from care everywhere and other healthcare systems.  Past medical history, social, surgical and family history all reviewed in electronic medical record.  No pertanent information unless stated regarding to the chief complaint.   Review of Systems:  No headache, visual changes, nausea, vomiting, diarrhea, constipation, dizziness, abdominal pain, skin rash, fevers, chills, night sweats, weight loss, swollen lymph nodes, joint swelling, chest pain, shortness of breath, mood changes. POSITIVE muscle aches, body aches, fatigue  Objective  Blood pressure 118/62, pulse 78, height 5' 4"  (1.626 m), weight 140 lb (63.5 kg), SpO2 99 %.   General: No apparent distress alert and oriented x3 mood and affect normal, dressed appropriately.  HEENT: Pupils equal, extraocular movements intact  Respiratory: Patient's speak in full sentences and does not appear short of breath  Cardiovascular: No lower extremity edema, non tender, no erythema  Antalgic gait MSK: Knee: Left Varus deformity noted. Large thigh to calf ratio.  Tender to palpation over medial and PF joint line.  ROM full in flexion and extension and lower leg rotation. instability with valgus force.  painful patellar compression. Patellar glide with moderate crepitus. Patellar and quadriceps  tendons unremarkable. Hamstring and quadriceps strength is normal. Arthritic changes noted as well mostly of the lateral compartment with mild instability but nontender  After informed written and verbal consent, patient was seated on exam table. Left knee was prepped with alcohol swab and utilizing anterolateral approach, patient's left knee space was injected with 4:1  marcaine 0.5%: Kenalog 68m/dL. Patient tolerated the procedure well without immediate complications.  Impression and Recommendations:     The above documentation has been reviewed and is accurate and complete [name], DO

## 2020-10-29 ENCOUNTER — Ambulatory Visit (INDEPENDENT_AMBULATORY_CARE_PROVIDER_SITE_OTHER): Payer: Medicare Other | Admitting: Family Medicine

## 2020-10-29 ENCOUNTER — Encounter: Payer: Self-pay | Admitting: Family Medicine

## 2020-10-29 ENCOUNTER — Other Ambulatory Visit: Payer: Self-pay

## 2020-10-29 VITALS — BP 118/62 | HR 78 | Ht 64.0 in | Wt 140.0 lb

## 2020-10-29 DIAGNOSIS — M1612 Unilateral primary osteoarthritis, left hip: Secondary | ICD-10-CM

## 2020-10-29 DIAGNOSIS — M2550 Pain in unspecified joint: Secondary | ICD-10-CM | POA: Diagnosis not present

## 2020-10-29 DIAGNOSIS — M170 Bilateral primary osteoarthritis of knee: Secondary | ICD-10-CM | POA: Diagnosis not present

## 2020-10-29 LAB — CBC WITH DIFFERENTIAL/PLATELET
Basophils Absolute: 0.1 10*3/uL (ref 0.0–0.1)
Basophils Relative: 0.9 % (ref 0.0–3.0)
Eosinophils Absolute: 0.2 10*3/uL (ref 0.0–0.7)
Eosinophils Relative: 2.3 % (ref 0.0–5.0)
HCT: 43 % (ref 36.0–46.0)
Hemoglobin: 13.7 g/dL (ref 12.0–15.0)
Lymphocytes Relative: 21.8 % (ref 12.0–46.0)
Lymphs Abs: 2 10*3/uL (ref 0.7–4.0)
MCHC: 31.8 g/dL (ref 30.0–36.0)
MCV: 78 fl (ref 78.0–100.0)
Monocytes Absolute: 0.6 10*3/uL (ref 0.1–1.0)
Monocytes Relative: 6.7 % (ref 3.0–12.0)
Neutro Abs: 6.3 10*3/uL (ref 1.4–7.7)
Neutrophils Relative %: 68.3 % (ref 43.0–77.0)
Platelets: 326 10*3/uL (ref 150.0–400.0)
RBC: 5.51 Mil/uL — ABNORMAL HIGH (ref 3.87–5.11)
RDW: 16.5 % — ABNORMAL HIGH (ref 11.5–15.5)
WBC: 9.2 10*3/uL (ref 4.0–10.5)

## 2020-10-29 LAB — IBC PANEL
Iron: 71 ug/dL (ref 42–145)
Saturation Ratios: 20.8 % (ref 20.0–50.0)
Transferrin: 244 mg/dL (ref 212.0–360.0)

## 2020-10-29 LAB — COMPREHENSIVE METABOLIC PANEL
ALT: 32 U/L (ref 0–35)
AST: 31 U/L (ref 0–37)
Albumin: 4.3 g/dL (ref 3.5–5.2)
Alkaline Phosphatase: 76 U/L (ref 39–117)
BUN: 13 mg/dL (ref 6–23)
CO2: 30 mEq/L (ref 19–32)
Calcium: 9.9 mg/dL (ref 8.4–10.5)
Chloride: 99 mEq/L (ref 96–112)
Creatinine, Ser: 0.9 mg/dL (ref 0.40–1.20)
GFR: 64.09 mL/min (ref >60.00)
Glucose, Bld: 104 mg/dL — ABNORMAL HIGH (ref 70–99)
Potassium: 4.5 mEq/L (ref 3.5–5.1)
Sodium: 136 mEq/L (ref 135–145)
Total Bilirubin: 0.5 mg/dL (ref 0.2–1.2)
Total Protein: 7.1 g/dL (ref 6.0–8.3)

## 2020-10-29 LAB — VITAMIN D 25 HYDROXY (VIT D DEFICIENCY, FRACTURES): VITD: 82.69 ng/mL (ref 30.00–100.00)

## 2020-10-29 NOTE — Assessment & Plan Note (Signed)
Known arthritis of the left hip.  Has had the recommendation of a replacement.  Patient is holding at this time.

## 2020-10-29 NOTE — Patient Instructions (Addendum)
Good to see you  Injection today in the left knee Labs today Arnica lotion for the hands Stay active See me again in 3 months

## 2020-10-29 NOTE — Assessment & Plan Note (Signed)
Left knee injected October 29, 2020.  Responded well to the injection.  We will watch the right knee and may need an injection on that side as well.  Patient is looking at possibly getting a hip replacement on the left side in the near future.  Chronic problem with exacerbation.  Discussed that patient did have some mild abnormal labs and with her feeling worse advised to repeat them looking at her anemia as well as the hypercalcemia.  Follow-up with me again in 3 months.

## 2020-10-30 LAB — PTH, INTACT AND CALCIUM
Calcium: 10 mg/dL (ref 8.6–10.4)
PTH: 21 pg/mL (ref 14–64)

## 2020-10-30 LAB — CALCIUM, IONIZED: Calcium, Ion: 5.2 mg/dL (ref 4.8–5.6)

## 2020-10-30 NOTE — Progress Notes (Signed)
   I, Wendy Poet, LAT, ATC, am serving as scribe for Dr. Lynne Leader.  Cathy [name] is a 74 y.o. female who presents to [place] [facility] today for R knee pain.  She was last seen by Dr. Tamala Julian on 10/29/20 for polyarthralgia including her L hip, B knees and shoulders  and had a L knee corticosteroid injection.  Since her visit w/ Dr. Tamala Julian, pt reports that her R knee has started bothering her more than it was 2 days ago and would like to get a R knee injection.    She would like to get gel shots but understands these are not been authorized.  She would like to proceed with steroid injections now and have gel shots authorized for the near future if possible.  Diagnostic imaging: L knee XR- 03/24/19  Pertinent review of systems: No fevers or chills  Relevant historical information: Hypertension, diabetes   Exam:  BP 130/70 (BP Location: Right Arm, Patient Position: Sitting, Cuff Size: Normal)   Pulse 88   Ht 5\' 4"  (1.626 m)   Wt 141 lb 9.6 oz (64.2 kg)   SpO2 98%   BMI 24.31 kg/m  General: Well Developed, well nourished, and in no acute distress.   MSK: Right knee normal-appearing normal motion with crepitation.  Stable ligamentous exam.    Lab and Radiology Results Procedure: Real-time Ultrasound Guided Injection of right knee superior lateral patellar space Device: Philips Affiniti 50G Images permanently stored and available for review in PACS Verbal informed consent obtained.  Discussed risks and benefits of procedure. Warned about infection bleeding damage to structures skin hypopigmentation and fat atrophy among others. Patient expresses understanding and agreement Time-out conducted.   Noted no overlying erythema, induration, or other signs of local infection.   Skin prepped in a sterile fashion.   Local anesthesia: Topical Ethyl chloride.   With sterile technique and under real time ultrasound guidance:  40 mg of Kenalog and 2 mL of Marcaine  injected into knee joint. Fluid seen entering the joint capsule.   Completed without difficulty   Pain immediately resolved suggesting accurate placement of the medication.   Advised to call if fevers/chills, erythema, induration, drainage, or persistent bleeding.   Images permanently stored and available for review in the ultrasound unit.  Impression: Technically successful ultrasound guided injection.     Assessment and Plan: 74 y.o. female with chronic right knee pain due to DJD.  Plan for steroid injection today.  We will work on authorizing hyaluronic acid injections for both knees however so in the future can proceed with those as needed.  Patient has done well with Durolane injections in the past.  Check back as needed.   PDMP not reviewed this encounter. Orders Placed This Encounter  Procedures  . [place] LIMITED JOINT SPACE STRUCTURES LOW RIGHT(NO LINKED CHARGES)    Order Specific Question:   Reason for Exam (SYMPTOM  OR DIAGNOSIS REQUIRED)    Answer:   R knee pain    Order Specific Question:   Preferred imaging location?    Answer:   [place]   No orders of the defined types were placed in this encounter.    Discussed warning signs or symptoms. Please see discharge instructions. Patient expresses understanding.   The above documentation has been reviewed and is accurate and complete Lynne Leader, M.D.

## 2020-10-31 ENCOUNTER — Encounter: Payer: Self-pay | Admitting: Family Medicine

## 2020-10-31 ENCOUNTER — Other Ambulatory Visit: Payer: Self-pay

## 2020-10-31 ENCOUNTER — Ambulatory Visit (INDEPENDENT_AMBULATORY_CARE_PROVIDER_SITE_OTHER): Payer: Medicare Other | Admitting: Family Medicine

## 2020-10-31 ENCOUNTER — Ambulatory Visit: Payer: Self-pay

## 2020-10-31 VITALS — BP 130/70 | HR 88 | Ht 64.0 in | Wt 141.6 lb

## 2020-10-31 DIAGNOSIS — G8929 Other chronic pain: Secondary | ICD-10-CM | POA: Diagnosis not present

## 2020-10-31 DIAGNOSIS — M170 Bilateral primary osteoarthritis of knee: Secondary | ICD-10-CM | POA: Diagnosis not present

## 2020-10-31 DIAGNOSIS — M25561 Pain in right knee: Secondary | ICD-10-CM

## 2020-10-31 NOTE — Patient Instructions (Signed)
Thank you for coming in today.   Call or go to the ER if you develop a large red swollen joint with extreme pain or oozing puss.   

## 2020-11-01 ENCOUNTER — Encounter: Payer: Self-pay | Admitting: Adult Health

## 2020-11-01 ENCOUNTER — Telehealth (INDEPENDENT_AMBULATORY_CARE_PROVIDER_SITE_OTHER): Payer: Medicare Other | Admitting: Adult Health

## 2020-11-01 VITALS — BP 118/70 | Temp 98.7°F | Wt 140.0 lb

## 2020-11-01 DIAGNOSIS — R350 Frequency of micturition: Secondary | ICD-10-CM

## 2020-11-01 NOTE — Progress Notes (Signed)
Virtual Visit via Telephone Note  I connected with Cathy [name] on 11/01/20 at 11:00 AM EST by telephone and verified that I am speaking with the correct person using two identifiers.   I discussed the limitations, risks, security and privacy concerns of performing an evaluation and management service by telephone and the availability of in person appointments. I also discussed with the patient that there may be a patient responsible charge related to this service. The patient expressed understanding and agreed to proceed.  Location patient: home Location provider: work or home office Participants present for the call: patient, provider Patient did not have a visit in the prior 7 days to address this/these issue(s).   History of Present Illness: 74 year old female who is being evaluated today for concern of UTI.  She does have a history of recurrent UTIs.  Symptoms started 5 days ago.  Symptoms include urinary frequency, urgency, and mild low back pain.  He denies fevers, chills, dysuria, hematuria or lower pelvic pain.   Observations/Objective: Patient sounds cheerful and well on the phone. I do not appreciate any SOB. Speech and thought processing are grossly intact. Patient reported vitals:  Assessment and Plan: 1. Urinary frequency -As mentioned above she does have a history of  recurrent urinary tract infections, but also has had symptoms without acute UTI.  We will have her bring in a urine sample today.  We will treat if urine sample does show signs of UTI - Urinalysis; Future - Culture, Urine; Future    Follow Up Instructions:  I did not refer this patient for an OV in the next 24 hours for this/these issue(s).  I discussed the assessment and treatment plan with the patient. The patient was provided an opportunity to ask questions and all were answered. The patient agreed with the plan and demonstrated an understanding of the instructions.   The patient was advised to  call back or seek an in-person evaluation if the symptoms worsen or if the condition fails to improve as anticipated.  I provided 13 minutes of non-face-to-face time during this encounter.   [name], NP

## 2020-11-06 ENCOUNTER — Other Ambulatory Visit: Payer: Self-pay

## 2020-11-06 ENCOUNTER — Other Ambulatory Visit (INDEPENDENT_AMBULATORY_CARE_PROVIDER_SITE_OTHER): Payer: Medicare Other

## 2020-11-06 DIAGNOSIS — R350 Frequency of micturition: Secondary | ICD-10-CM

## 2020-11-07 LAB — URINE CULTURE
MICRO NUMBER:: 11321227
SPECIMEN QUALITY:: ADEQUATE

## 2020-11-07 LAB — URINALYSIS
Bilirubin Urine: NEGATIVE
Glucose, UA: NEGATIVE
Hgb urine dipstick: NEGATIVE
Ketones, ur: NEGATIVE
Nitrite: NEGATIVE
Protein, ur: NEGATIVE
Specific Gravity, Urine: 1.015 (ref 1.001–1.03)
pH: 7 (ref 5.0–8.0)

## 2020-11-08 ENCOUNTER — Telehealth: Payer: Self-pay | Admitting: Adult Health

## 2020-11-08 NOTE — Telephone Encounter (Signed)
I left a detailed message at the pts home number with the information below from Dr Jerilee Hoh.

## 2020-11-08 NOTE — Telephone Encounter (Signed)
Pt is calling in to see if her lab results has come back so that she can get something for the UTI that she has had since 11/01/2020 and stated that she is still having the frequent urination.

## 2020-11-08 NOTE — Telephone Encounter (Signed)
Looks like urine cx was negative. No abx needed.

## 2020-11-08 NOTE — Telephone Encounter (Signed)
Left a message for the pt to return my call.  

## 2020-11-26 ENCOUNTER — Other Ambulatory Visit: Payer: Self-pay | Admitting: Adult Health

## 2020-11-27 NOTE — Telephone Encounter (Signed)
SENT TO THE PHARMACY BY E-SCRIBE. 

## 2020-12-02 DIAGNOSIS — M1612 Unilateral primary osteoarthritis, left hip: Secondary | ICD-10-CM | POA: Diagnosis not present

## 2020-12-11 ENCOUNTER — Telehealth: Payer: Self-pay | Admitting: Family Medicine

## 2020-12-11 NOTE — Telephone Encounter (Signed)
Pt called and scheduled her Durolane injections, this was approved in 2021 so unsure if re-verification needed for 2022.

## 2020-12-13 NOTE — Telephone Encounter (Signed)
I have ran patient through the portal awaiting a response from insurance

## 2020-12-16 NOTE — Telephone Encounter (Signed)
Durolane approved.

## 2020-12-25 ENCOUNTER — Encounter: Payer: Self-pay | Admitting: Adult Health

## 2020-12-25 ENCOUNTER — Ambulatory Visit (INDEPENDENT_AMBULATORY_CARE_PROVIDER_SITE_OTHER): Payer: Medicare Other | Admitting: Adult Health

## 2020-12-25 ENCOUNTER — Other Ambulatory Visit: Payer: Self-pay

## 2020-12-25 VITALS — BP 136/90 | Temp 98.0°F | Wt 139.0 lb

## 2020-12-25 DIAGNOSIS — H6123 Impacted cerumen, bilateral: Secondary | ICD-10-CM

## 2020-12-25 DIAGNOSIS — I10 Essential (primary) hypertension: Secondary | ICD-10-CM | POA: Diagnosis not present

## 2020-12-25 DIAGNOSIS — R350 Frequency of micturition: Secondary | ICD-10-CM | POA: Diagnosis not present

## 2020-12-25 LAB — POC URINALSYSI DIPSTICK (AUTOMATED)
Bilirubin, UA: NEGATIVE
Blood, UA: NEGATIVE
Glucose, UA: NEGATIVE
Ketones, UA: NEGATIVE
Nitrite, UA: NEGATIVE
Protein, UA: NEGATIVE
Spec Grav, UA: 1.015 (ref 1.010–1.025)
Urobilinogen, UA: 0.2 E.U./dL
pH, UA: 6.5 (ref 5.0–8.0)

## 2020-12-25 NOTE — Progress Notes (Addendum)
Subjective:    Patient ID: Cathy [name], female    DOB: 06/19/1946, 75 y.o.   MRN: [id number]  HPI 75 year old female who  has a past medical history of Chronic constipation, Depression, Diabetes mellitus type II, DJD (degenerative joint disease), Female cystocele, GERD (gastroesophageal reflux disease), History of esophagitis, History of left inguinal hernia, History of MRSA infection, History of recurrent UTIs, Hyperlipidemia, Hypertension, and Urgency of urination.  She presents to the office today for multiple issues.   1. She reports that she checked her blood pressure yesterday and she had a reading in the 200's/90's last night. When she rechecked this morning it was 151/91 and then had a friend took it and it was 122/70. Today in the office her BP is 136/90. She denies symptoms of hypertension.   BP Readings from Last 3 Encounters:  12/25/20 136/90  11/01/20 118/70  10/31/20 130/70   2. Frequent urination -ongoing issue.  Seen by GYN.occluded.  History of vaginal prolapse repair.  Does report issues with intermittent "tingling sensation sometimes when I pee".  Denies urgency, hematuria, or outright dysuria.  In the past her urine cultures have been without infection.  3. Feeling of ear fullness bilaterally.  Feels as though she has some mild ear fullness and water gets stuck in her ears after shower.   Review of Systems See HPI   Past Medical History:  Diagnosis Date  . Chronic constipation   . Depression   . Diabetes mellitus type II    no longer taking medication  . DJD (degenerative joint disease)    knees  . Female cystocele   . GERD (gastroesophageal reflux disease)   . History of esophagitis   . History of left inguinal hernia   . History of MRSA infection    recurrent carbuncle  . History of recurrent UTIs   . Hyperlipidemia   . Hypertension   . Urgency of urination     Social History   Socioeconomic History  . Marital status: Single    Spouse  name: Not on file  . Number of children: Not on file  . Years of education: Not on file  . Highest education level: Not on file  Occupational History  . Not on file  Tobacco Use  . Smoking status: Former Smoker    Years: 1.00    Types: Cigarettes    Quit date: 02/28/1996    Years since quitting: 24.8  . Smokeless tobacco: Never Used  Vaping Use  . Vaping Use: Never used  Substance and Sexual Activity  . Alcohol use: Yes    Alcohol/week: 0.0 standard drinks    Comment: occasional  . Drug use: No  . Sexual activity: Not on file  Other Topics Concern  . Not on file  Social History Narrative   Single   Former Smoker  -  quit 10 to 11 years ago (light smoker)   Alcohol use-yes     2 children    Occupation: [employer]    Social Determinants of Health   Financial Resource Strain: Low Risk   . Difficulty of Paying Living Expenses: Not hard at all  Food Insecurity: No Food Insecurity  . Worried About Charity fundraiser in the Last Year: Never true  . Ran Out of Food in the Last Year: Never true  Transportation Needs: No Transportation Needs  . Lack of Transportation (Medical): No  . Lack of Transportation (Non-Medical): No  Physical  Activity: Inactive  . Days of Exercise per Week: 0 days  . Minutes of Exercise per Session: 0 min  Stress: No Stress Concern Present  . Feeling of Stress : Not at all  Social Connections: Moderately Isolated  . Frequency of Communication with Friends and Family: More than three times a week  . Frequency of Social Gatherings with Friends and Family: More than three times a week  . Attends Religious Services: More than 4 times per year  . Active Member of Clubs or Organizations: No  . Attends Archivist Meetings: Never  . Marital Status: Never married  Intimate Partner Violence: Not At Risk  . Fear of Current or Ex-Partner: No  . Emotionally Abused: No  . Physically Abused: No  . Sexually Abused: No    Past Surgical  History:  Procedure Laterality Date  . APPENDECTOMY  1980's  . BREAST EXCISIONAL BIOPSY Left 1989  . COLONOSCOPY  last one 06-18-2015  . INGUINAL HERNIA REPAIR Left 05-10-2001  . LAPAROSCOPIC LYSIS OF ADHESIONS  01/17/2019  . OOPHORECTOMY Right 01/17/2019  . PERINEOPLASTY  01/17/2019  . SALPINGECTOMY Left 01/17/2019  . TOTAL HIP ARTHROPLASTY Right 04/09/2020   Procedure: RIGH TOTAL HIP ARTHROPLASTY ANTERIOR APPROACH;  Surgeon: Melrose Nakayama, MD;  Location: [facility];  Service: Orthopedics;  Laterality: Right;  [name] VAGINAL HYSTERECTOMY  1980's  . VAGINAL PROLAPSE REPAIR N/A 03/02/2016   Procedure: COLOPLAST ANTERIOR  VAULT REPAIR WITH AXIS DERMIS. SACROSPINUS FIXATION, AUGMENTATION WITH AXIS DERMIS;  Surgeon: Carolan Clines, MD;  Location: [facility];  Service: Urology;  Laterality: N/A;    Family History  Problem Relation Age of Onset  . Aneurysm Mother 78       deceased secondary to brain aneurysm  . Liver cancer Father 33       deceased  . Diabetes Other        grandmother  . Colon cancer Neg Hx   . Stomach cancer Neg Hx   . Rectal cancer Neg Hx   . Esophageal cancer Neg Hx   . Breast cancer Neg Hx     Allergies  Allergen Reactions  . Penicillins Hives    Has patient had a PCN reaction causing immediate rash, facial/tongue/throat swelling, SOB or lightheadedness with hypotension: Yes Has patient had a PCN reaction causing severe rash involving mucus membranes or skin necrosis: No Has patient had a PCN reaction that required hospitalization: No Has patient had a PCN reaction occurring within the last 10 years: No If all of the above answers are "NO", then may proceed with Cephalosporin use.     Current Outpatient Medications on File Prior to Visit  Medication Sig Dispense Refill  . Ascorbic Acid (VITAMIN C) 1000 MG tablet Take 1,000 mg by mouth daily.    [name] aspirin 81 MG tablet Take 1 tablet (81 mg total) by mouth 2 (two) times daily after a meal. 60  tablet 0  . b complex vitamins capsule Take 1 capsule by mouth daily.    . Biotin 1000 MCG tablet Take 1,000 mcg by mouth daily.    . blood glucose meter kit and supplies KIT Dispense based on patient and insurance preference. Use once daily to test blood glucose. (FOR ICD-E11.9). 1 each 0  . Calcium Carbonate-Vitamin D 600-400 MG-UNIT tablet Take 1 tablet by mouth daily.    . Cinnamon 500 MG capsule Take 1,000 mg by mouth daily.    [name] CRANBERRY PO Take 2 tablets by mouth daily. [place]  mg    . Cyanocobalamin (VITAMIN B-12) 2500 MCG SUBL Place 2,500 mcg under the tongue daily.    [name] estradiol (ESTRACE) 0.1 MG/GM vaginal cream Place 1 Applicatorful vaginally every other day. In the evening  3  . fexofenadine (ALLEGRA) 180 MG tablet Take 180 mg by mouth daily.    . fluticasone (FLONASE) 50 MCG/ACT nasal spray SHAKE LIQUID AND USE 2 SPRAYS IN EACH NOSTRIL DAILY 48 g 2  . gabapentin (NEURONTIN) 100 MG capsule TAKE 2 CAPSULES(200 MG) BY MOUTH AT BEDTIME 180 capsule 0  . Lancets (ONETOUCH DELICA PLUS [id number]) MISC USE ONCE DAILY 100 each 3  . lisinopril (ZESTRIL) 10 MG tablet TAKE 1 TABLET(10 MG) BY MOUTH DAILY 90 tablet 3  . Misc Natural Products (TART CHERRY ADVANCED) CAPS Take 2 capsules by mouth daily.     . Multiple Vitamins-Minerals (MULTIVITAMIN WITH MINERALS) tablet Take 1 tablet by mouth daily. Centrum silver    . ONETOUCH VERIO test strip USE TO TEST BLOOD GLUCOSE TWICE DAILY 200 strip 3  . OVER THE COUNTER MEDICATION Take 1 tablet by mouth daily as needed (constipation). Vital Lax    . polyethylene glycol (MIRALAX / GLYCOLAX) packet Take 17 g by mouth daily as needed for mild constipation.    . rosuvastatin (CRESTOR) 40 MG tablet Take 1 tablet (40 mg total) by mouth daily. 90 tablet 1  . traZODone (DESYREL) 50 MG tablet TAKE 1/2 TO 1 TABLET(25 TO 50 MG) BY MOUTH AT BEDTIME AS NEEDED FOR SLEEP 30 tablet 3  . trimethoprim (TRIMPEX) 100 MG tablet Take 100 mg by mouth as needed.    . Turmeric 500  MG CAPS Take 1,000 mg by mouth daily.     . [DISCONTINUED] valsartan (DIOVAN) 160 MG tablet Take 1/2 tablet by mouth once daily 30 tablet 5   No current facility-administered medications on file prior to visit.    BP 136/90   Temp 98 F (36.7 C)   Wt 139 lb (63 kg)   BMI 23.86 kg/m       Objective:   Physical Exam Vitals and nursing note reviewed.  Constitutional:      Appearance: Normal appearance.  HENT:     Right Ear: There is impacted cerumen.     Left Ear: There is impacted cerumen.  Cardiovascular:     Rate and Rhythm: Normal rate and regular rhythm.     Pulses: Normal pulses.     Heart sounds: Normal heart sounds.  Pulmonary:     Effort: Pulmonary effort is normal.     Breath sounds: Normal breath sounds.  Abdominal:     General: Abdomen is flat. Bowel sounds are normal.     Palpations: Abdomen is soft.  Musculoskeletal:        General: Normal range of motion.  Skin:    General: Skin is warm and dry.     Capillary Refill: Capillary refill takes less than 2 seconds.  Neurological:     General: No focal deficit present.     Mental Status: She is alert and oriented to person, place, and time.  Psychiatric:        Mood and Affect: Mood normal.        Behavior: Behavior normal.        Thought Content: Thought content normal.        Judgment: Judgment normal.       Assessment & Plan:  1. Urinary frequency  - POCT Urinalysis Dipstick (Automated) + leuks, no  nitrites. Consistent with previous urinalysis without UTI  2. Essential hypertension - No change in BP meds at this time  - Her cuff is > 37 years old. Advised getting new cuff and continue to monitor at home   3. Bilateral impacted cerumen - Verbal consent obtaint Warm water was applied and gentle ear lavage performed bilaterally. There were no complications and following the disimpaction the tympanic membrane were visible bilaterally. Tympanic membranes are intact following the procedure.  Auditory  canals are normal.  The patient reported relief of symptoms after removal of cerumen. Patient tolerated procedure well.   [name], NP   In addition to the time spent performing cerumen removal, I spent 30 minutes on chart review, time with patient; discussion of hypertension, urinary tract infectionshome monitoring with cuff,  treatment, follow up plan, and documentation

## 2020-12-26 NOTE — Progress Notes (Unsigned)
[place] Phone: [phone] Subjective:   [name], am serving as a scribe for Dr. Hulan Saas. This visit occurred during the SARS-CoV-2 public health emergency.  Safety protocols were in place, including screening questions prior to the visit, additional usage of staff PPE, and extensive cleaning of exam room while observing appropriate contact time as indicated for disinfecting solutions.   I'm seeing this patient by the request  of:  [name], NP  CC: Bilateral knee pain  [id number]   10/29/2020 Left knee injected October 29, 2020.  Responded well to the injection.  We will watch the right knee and may need an injection on that side as well.  Patient is looking at possibly getting a hip replacement on the left side in the near future.  Chronic problem with exacerbation.  Discussed that patient did have some mild abnormal labs and with her feeling worse advised to repeat them looking at her anemia as well as the hypercalcemia.  Follow-up with me again in 3 months.   Update 12/27/2020 Cathy [name] is a 75 y.o. female coming in with complaint of bilateral knee pain. Left knee injected on 10/29/2020 and right knee injected by Dr. Georgina Snell on 10/31/2020. Patient states that is going to have the other hip replaced on 01/27/21. Is having left knee pain and locking with getting in and out of the car. Looking for injections in both knees.  Patient has known arthritic changes of the knees bilaterally.  Patient states that it is affecting daily activities.  Patient is going to be having a hip replacement soon.  Affects her walking.  Sometimes can wake her up at night.     Past Medical History:  Diagnosis Date  . Chronic constipation   . Depression   . Diabetes mellitus type II    no longer taking medication  . DJD (degenerative joint disease)    knees  . Female cystocele   . GERD (gastroesophageal reflux  disease)   . History of esophagitis   . History of left inguinal hernia   . History of MRSA infection    recurrent carbuncle  . History of recurrent UTIs   . Hyperlipidemia   . Hypertension   . Urgency of urination    Past Surgical History:  Procedure Laterality Date  . APPENDECTOMY  1980's  . BREAST EXCISIONAL BIOPSY Left 1989  . COLONOSCOPY  last one 06-18-2015  . INGUINAL HERNIA REPAIR Left 05-10-2001  . LAPAROSCOPIC LYSIS OF ADHESIONS  01/17/2019  . OOPHORECTOMY Right 01/17/2019  . PERINEOPLASTY  01/17/2019  . SALPINGECTOMY Left 01/17/2019  . TOTAL HIP ARTHROPLASTY Right 04/09/2020   Procedure: RIGH TOTAL HIP ARTHROPLASTY ANTERIOR APPROACH;  Surgeon: Melrose Nakayama, MD;  Location: [facility];  Service: Orthopedics;  Laterality: Right;  [name] VAGINAL HYSTERECTOMY  1980's  . VAGINAL PROLAPSE REPAIR N/A 03/02/2016   Procedure: COLOPLAST ANTERIOR  VAULT REPAIR WITH AXIS DERMIS. SACROSPINUS FIXATION, AUGMENTATION WITH AXIS DERMIS;  Surgeon: Carolan Clines, MD;  Location: [facility];  Service: Urology;  Laterality: N/A;   Social History   Socioeconomic History  . Marital status: Single    Spouse name: Not on file  . Number of children: Not on file  . Years of education: Not on file  . Highest education level: Not on file  Occupational History  . Not on file  Tobacco Use  . Smoking status: Former Smoker    Years: 1.00  Types: Cigarettes    Quit date: 02/28/1996    Years since quitting: 24.8  . Smokeless tobacco: Never Used  Vaping Use  . Vaping Use: Never used  Substance and Sexual Activity  . Alcohol use: Yes    Alcohol/week: 0.0 standard drinks    Comment: occasional  . Drug use: No  . Sexual activity: Not on file  Other Topics Concern  . Not on file  Social History Narrative   Single   Former Smoker  -  quit 10 to 11 years ago (light smoker)   Alcohol use-yes     2 children    Occupation: [employer]    Social Determinants of  Health   Financial Resource Strain: Low Risk   . Difficulty of Paying Living Expenses: Not hard at all  Food Insecurity: No Food Insecurity  . Worried About Charity fundraiser in the Last Year: Never true  . Ran Out of Food in the Last Year: Never true  Transportation Needs: No Transportation Needs  . Lack of Transportation (Medical): No  . Lack of Transportation (Non-Medical): No  Physical Activity: Inactive  . Days of Exercise per Week: 0 days  . Minutes of Exercise per Session: 0 min  Stress: No Stress Concern Present  . Feeling of Stress : Not at all  Social Connections: Moderately Isolated  . Frequency of Communication with Friends and Family: More than three times a week  . Frequency of Social Gatherings with Friends and Family: More than three times a week  . Attends Religious Services: More than 4 times per year  . Active Member of Clubs or Organizations: No  . Attends Archivist Meetings: Never  . Marital Status: Never married   Allergies  Allergen Reactions  . Penicillins Hives    Has patient had a PCN reaction causing immediate rash, facial/tongue/throat swelling, SOB or lightheadedness with hypotension: Yes Has patient had a PCN reaction causing severe rash involving mucus membranes or skin necrosis: No Has patient had a PCN reaction that required hospitalization: No Has patient had a PCN reaction occurring within the last 10 years: No If all of the above answers are "NO", then may proceed with Cephalosporin use.    Family History  Problem Relation Age of Onset  . Aneurysm Mother 74       deceased secondary to brain aneurysm  . Liver cancer Father 106       deceased  . Diabetes Other        grandmother  . Colon cancer Neg Hx   . Stomach cancer Neg Hx   . Rectal cancer Neg Hx   . Esophageal cancer Neg Hx   . Breast cancer Neg Hx      Current Outpatient Medications (Cardiovascular):  .  lisinopril (ZESTRIL) 10 MG tablet, TAKE 1 TABLET(10 MG) BY  MOUTH DAILY .  rosuvastatin (CRESTOR) 40 MG tablet, Take 1 tablet (40 mg total) by mouth daily.  Current Outpatient Medications (Respiratory):  .  fexofenadine (ALLEGRA) 180 MG tablet, Take 180 mg by mouth daily. .  fluticasone (FLONASE) 50 MCG/ACT nasal spray, SHAKE LIQUID AND USE 2 SPRAYS IN EACH NOSTRIL DAILY  Current Outpatient Medications (Analgesics):  .  aspirin 81 MG tablet, Take 1 tablet (81 mg total) by mouth 2 (two) times daily after a meal.  Current Outpatient Medications (Hematological):  [name]  Cyanocobalamin (VITAMIN B-12) 2500 MCG SUBL, Place 2,500 mcg under the tongue daily.  Current Outpatient Medications (Other):  .  Ascorbic Acid (VITAMIN C) 1000 MG tablet, Take 1,000 mg by mouth daily. [name]  b complex vitamins capsule, Take 1 capsule by mouth daily. .  Biotin 1000 MCG tablet, Take 1,000 mcg by mouth daily. .  blood glucose meter kit and supplies KIT, Dispense based on patient and insurance preference. Use once daily to test blood glucose. (FOR ICD-E11.9). .  Calcium Carbonate-Vitamin D 600-400 MG-UNIT tablet, Take 1 tablet by mouth daily. .  Cinnamon 500 MG capsule, Take 1,000 mg by mouth daily. [name]  CRANBERRY PO, Take 2 tablets by mouth daily. 4200 mg .  estradiol (ESTRACE) 0.1 MG/GM vaginal cream, Place 1 Applicatorful vaginally every other day. In the evening .  gabapentin (NEURONTIN) 100 MG capsule, TAKE 2 CAPSULES(200 MG) BY MOUTH AT BEDTIME .  Lancets (ONETOUCH DELICA PLUS [id number]) MISC, USE ONCE DAILY .  Misc Natural Products (TART CHERRY ADVANCED) CAPS, Take 2 capsules by mouth daily.  .  Multiple Vitamins-Minerals (MULTIVITAMIN WITH MINERALS) tablet, Take 1 tablet by mouth daily. Centrum silver .  ONETOUCH VERIO test strip, USE TO TEST BLOOD GLUCOSE TWICE DAILY .  OVER THE COUNTER MEDICATION, Take 1 tablet by mouth daily as needed (constipation). Vital Lax .  polyethylene glycol (MIRALAX / GLYCOLAX) packet, Take 17 g by mouth daily as needed for mild  constipation. .  traZODone (DESYREL) 50 MG tablet, TAKE 1/2 TO 1 TABLET(25 TO 50 MG) BY MOUTH AT BEDTIME AS NEEDED FOR SLEEP .  trimethoprim (TRIMPEX) 100 MG tablet, Take 100 mg by mouth as needed. .  Turmeric 500 MG CAPS, Take 1,000 mg by mouth daily.    Reviewed prior external information including notes and imaging from  primary care provider As well as notes that were available from care everywhere and other healthcare systems.  Past medical history, social, surgical and family history all reviewed in electronic medical record.  No pertanent information unless stated regarding to the chief complaint.   Review of Systems:  No headache, visual changes, nausea, vomiting, diarrhea, constipation, dizziness, abdominal pain, skin rash, fevers, chills, night sweats, weight loss, swollen lymph nodes, body aches, joint swelling, chest pain, shortness of breath, mood changes. POSITIVE muscle aches  Objective  Blood pressure 130/76, pulse 81, height 5' 4"  (1.626 m), weight 142 lb (64.4 kg), SpO2 98 %.   General: No apparent distress alert and oriented x3 mood and affect normal, dressed appropriately.  HEENT: Pupils equal, extraocular movements intact  Respiratory: Patient's speak in full sentences and does not appear short of breath  Cardiovascular: No lower extremity edema, non tender, no erythema  Gait antalgic Knee: Bilateral Varus deformity noted.  Abnormal thigh to calf ratio.  Slight effusion of the knees bilaterally Tender to palpation over medial and PF joint line.  ROM full extension bilaterally but does lack the last 5 degrees of flexion on the left instability with valgus force.  painful patellar compression. Patellar glide with moderate crepitus. Patellar and quadriceps tendons unremarkable. Hamstring and quadriceps strength is normal.   After informed written and verbal consent, patient was seated on exam table. Right knee was prepped with alcohol swab and utilizing  anterolateral approach, patient's right knee space was injected with 60 mg per 3 mL of Durolane(sodium hyaluronate) in a prefilled syringe was injected easily into the knee through a 22-gauge needle..Patient tolerated the procedure well without immediate complications.  After informed written and verbal consent, patient was seated on exam table. Left knee was prepped with alcohol swab and utilizing anterolateral approach, patient's left  knee space was injected with 60 mg per 3 mL of Durolane (sodium hyaluronate) in a prefilled syringe was injected easily into the knee through a 22-gauge needle..Patient tolerated the procedure well without immediate complications.   Impression and Recommendations:     The above documentation has been reviewed and is accurate and complete [name], DO

## 2020-12-27 ENCOUNTER — Ambulatory Visit (INDEPENDENT_AMBULATORY_CARE_PROVIDER_SITE_OTHER): Payer: Medicare Other | Admitting: Family Medicine

## 2020-12-27 ENCOUNTER — Other Ambulatory Visit: Payer: Self-pay

## 2020-12-27 ENCOUNTER — Encounter: Payer: Self-pay | Admitting: Family Medicine

## 2020-12-27 ENCOUNTER — Ambulatory Visit (INDEPENDENT_AMBULATORY_CARE_PROVIDER_SITE_OTHER): Payer: Medicare Other

## 2020-12-27 VITALS — BP 130/76 | HR 81 | Ht 64.0 in | Wt 142.0 lb

## 2020-12-27 DIAGNOSIS — G8929 Other chronic pain: Secondary | ICD-10-CM | POA: Diagnosis not present

## 2020-12-27 DIAGNOSIS — M170 Bilateral primary osteoarthritis of knee: Secondary | ICD-10-CM | POA: Diagnosis not present

## 2020-12-27 DIAGNOSIS — M25561 Pain in right knee: Secondary | ICD-10-CM

## 2020-12-27 DIAGNOSIS — M25562 Pain in left knee: Secondary | ICD-10-CM

## 2020-12-27 NOTE — Assessment & Plan Note (Signed)
Patient has done relatively well for quite some time.  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  We will get new x-rays.  Patient is having a hip replacement in the near future and I think patient will do well.  Hopefully this will give her some respite of pain for the knees but likely will need to consider the possibility of replacement therapy as well.  Patient will follow up with me again in 3 months for this chronic problem with exacerbation

## 2020-12-27 NOTE — Patient Instructions (Addendum)
Xray today Gel injections today We can talk about your hands at follow-up Good luck with the next hip See me again in 3 months

## 2020-12-31 ENCOUNTER — Other Ambulatory Visit: Payer: Self-pay | Admitting: Orthopaedic Surgery

## 2020-12-31 DIAGNOSIS — Z01818 Encounter for other preprocedural examination: Secondary | ICD-10-CM

## 2021-01-15 ENCOUNTER — Other Ambulatory Visit: Payer: Self-pay | Admitting: Adult Health

## 2021-01-17 NOTE — Patient Instructions (Addendum)
DUE TO COVID-19 ONLY ONE VISITOR IS ALLOWED TO COME WITH YOU AND STAY IN THE WAITING ROOM ONLY DURING PRE OP AND PROCEDURE DAY OF SURGERY. THE 1 VISITOR  MAY VISIT WITH YOU AFTER SURGERY IN YOUR PRIVATE ROOM DURING VISITING HOURS ONLY!  YOU NEED TO HAVE A COVID 19 TEST ON_3/4______ @_2 :40______, THIS TEST MUST BE DONE BEFORE SURGERY,  COVID TESTING SITE [place] [address], IT IS ON THE RIGHT GOING OUT [address] APPROXIMATELY  2 MINUTES PAST [employer] ON THE RIGHT. ONCE YOUR COVID TEST IS COMPLETED,  PLEASE BEGIN THE QUARANTINE INSTRUCTIONS AS OUTLINED IN YOUR HANDOUT.                Cathy [name]    Your procedure is scheduled on: 3/8/2   Report to [facility] Main  Entrance   Report to SHORT STAY AT 5:30 AM     Call this number if you have problems the morning of surgery [phone]     BRUSH Helen, NO CHEWING GUM CANDY OR MINTS.  No food after midnight.    You may have clear liquid until 4:30 AM.    Nothing by mouth after 4:30 AM.    Take these medicines the morning of surgery with A SIP OF WATER: none  DO NOT TAKE ANY DIABETIC MEDICATIONS DAY OF YOUR SURGERY                               You may not have any metal on your body including hair pins             piercings  Do not wear jewelry, make-up, lotions, powders or perfumes, deodorant             Do not wear nail polish on your fingernails.  Do not shave  48 hours prior to surgery.            Do not bring valuables to the hospital. [name].  Contacts, dentures or bridgework may not be worn into surgery.       Patients discharged the day of surgery will not be allowed to drive home.   IF YOU ARE HAVING SURGERY AND GOING HOME THE SAME DAY, YOU MUST HAVE AN ADULT TO DRIVE YOU HOME AND BE WITH YOU FOR 24 HOURS.  YOU MAY GO HOME BY TAXI OR UBER OR ORTHERWISE, BUT AN ADULT  MUST ACCOMPANY YOU HOME AND STAY WITH YOU FOR 24 HOURS.  Name and phone number of your driver:  Special Instructions: N/A              Please read over the following fact sheets you were given: _____________________________________________________________________             [facility] - Preparing for Surgery Before surgery, you can play an important role.  Because skin is not sterile, your skin needs to be as free of germs as possible.  You can reduce the number of germs on your skin by washing with CHG (chlorahexidine gluconate) soap before surgery.  CHG is an antiseptic cleaner which kills germs and bonds with the skin to continue killing germs even after washing. Please DO NOT use if you have an allergy to CHG or antibacterial soaps.  If your skin becomes reddened/irritated stop using the CHG and inform your nurse when you arrive at Short Stay. Do not shave (including legs and underarms) for at least 48 hours prior to the first CHG shower.    Please follow these instructions carefully:  1.  Shower with CHG Soap the night before surgery and the  morning of Surgery.  2.  If you choose to wash your hair, wash your hair first as usual with your  normal  shampoo.  3.  After you shampoo, rinse your hair and body thoroughly to remove the  shampoo.                                        4.  Use CHG as you would any other liquid soap.  You can apply chg directly  to the skin and wash                       Gently with a scrungie or clean washcloth.  5.  Apply the CHG Soap to your body ONLY FROM THE NECK DOWN.   Do not use on face/ open                           Wound or open sores. Avoid contact with eyes, ears mouth and genitals (private parts).                       Wash face,  Genitals (private parts) with your normal soap.             6.  Wash thoroughly, paying special attention to the area where your surgery  will be performed.  7.  Thoroughly rinse your body with warm water from the neck  down.  8.  DO NOT shower/wash with your normal soap after using and rinsing off  the CHG Soap.             9.  Pat yourself dry with a clean towel.            10.  Wear clean pajamas.            11.  Place clean sheets on your bed the night of your first shower and do not  sleep with pets. Day of Surgery : Do not apply any lotions/deodorants the morning of surgery.  Please wear clean clothes to the hospital/surgery center.  FAILURE TO FOLLOW THESE INSTRUCTIONS MAY RESULT IN THE CANCELLATION OF YOUR SURGERY PATIENT SIGNATURE_________________________________  NURSE SIGNATURE__________________________________  ________________________________________________________________________   Cathy [name]  An incentive spirometer is a tool that can help keep your lungs clear and active. This tool measures how well you are filling your lungs with each breath. Taking long deep breaths may help reverse or decrease the chance of developing breathing (pulmonary) problems (especially infection) following:  A long period of time when you are unable to move or be active. BEFORE THE PROCEDURE   If the spirometer includes an indicator to show your best effort, your nurse or respiratory therapist will set it to a desired goal.  If possible, sit up straight or lean slightly forward. Try not to slouch.  Hold the incentive spirometer in an upright position. INSTRUCTIONS FOR USE  1. Sit on the edge of your bed if possible, or sit up as far as  you can in bed or on a chair. 2. Hold the incentive spirometer in an upright position. 3. Breathe out normally. 4. Place the mouthpiece in your mouth and seal your lips tightly around it. 5. Breathe in slowly and as deeply as possible, raising the piston or the ball toward the top of the column. 6. Hold your breath for 3-5 seconds or for as long as possible. Allow the piston or ball to fall to the bottom of the column. 7. Remove the mouthpiece from your mouth  and breathe out normally. 8. Rest for a few seconds and repeat Steps 1 through 7 at least 10 times every 1-2 hours when you are awake. Take your time and take a few normal breaths between deep breaths. 9. The spirometer may include an indicator to show your best effort. Use the indicator as a goal to work toward during each repetition. 10. After each set of 10 deep breaths, practice coughing to be sure your lungs are clear. If you have an incision (the cut made at the time of surgery), support your incision when coughing by placing a pillow or rolled up towels firmly against it. Once you are able to get out of bed, walk around indoors and cough well. You may stop using the incentive spirometer when instructed by your caregiver.  RISKS AND COMPLICATIONS  Take your time so you do not get dizzy or light-headed.  If you are in pain, you may need to take or ask for pain medication before doing incentive spirometry. It is harder to take a deep breath if you are having pain. AFTER USE  Rest and breathe slowly and easily.  It can be helpful to keep track of a log of your progress. Your caregiver can provide you with a simple table to help with this. If you are using the spirometer at home, follow these instructions: [place] IF:   You are having difficultly using the spirometer.  You have trouble using the spirometer as often as instructed.  Your pain medication is not giving enough relief while using the spirometer.  You develop fever of 100.5 F (38.1 C) or higher. SEEK IMMEDIATE MEDICAL CARE IF:   You cough up bloody sputum that had not been present before.  You develop fever of 102 F (38.9 C) or greater.  You develop worsening pain at or near the incision site. MAKE SURE YOU:   Understand these instructions.  Will watch your condition.  Will get help right away if you are not doing well or get worse. Document Released: 03/22/2007 Document Revised: 02/01/2012 Document  Reviewed: 05/23/2007 [facility] Patient Information 2014 [place], [place].   ________________________________________________________________________

## 2021-01-20 ENCOUNTER — Other Ambulatory Visit: Payer: Self-pay | Admitting: Orthopaedic Surgery

## 2021-01-20 ENCOUNTER — Other Ambulatory Visit: Payer: Self-pay

## 2021-01-21 ENCOUNTER — Encounter (HOSPITAL_COMMUNITY)
Admission: RE | Admit: 2021-01-21 | Discharge: 2021-01-21 | Disposition: A | Discharge status: Home / Self Care | Payer: Medicare Other | Source: Ambulatory Visit | Attending: Orthopaedic Surgery | Admitting: Orthopaedic Surgery

## 2021-01-21 ENCOUNTER — Encounter (HOSPITAL_COMMUNITY): Payer: Self-pay

## 2021-01-21 ENCOUNTER — Ambulatory Visit: Payer: Medicare Other | Admitting: Family Medicine

## 2021-01-21 ENCOUNTER — Other Ambulatory Visit: Payer: Self-pay

## 2021-01-21 ENCOUNTER — Ambulatory Visit (INDEPENDENT_AMBULATORY_CARE_PROVIDER_SITE_OTHER): Payer: Medicare Other | Admitting: Adult Health

## 2021-01-21 ENCOUNTER — Encounter: Payer: Self-pay | Admitting: Adult Health

## 2021-01-21 VITALS — BP 114/60 | HR 75 | Temp 98.5°F | Resp 18 | Wt 142.2 lb

## 2021-01-21 DIAGNOSIS — M791 Myalgia, unspecified site: Secondary | ICD-10-CM | POA: Diagnosis not present

## 2021-01-21 DIAGNOSIS — I10 Essential (primary) hypertension: Secondary | ICD-10-CM

## 2021-01-21 DIAGNOSIS — Z01818 Encounter for other preprocedural examination: Secondary | ICD-10-CM | POA: Diagnosis not present

## 2021-01-21 HISTORY — DX: Prediabetes: R73.03

## 2021-01-21 LAB — CBC WITH DIFFERENTIAL/PLATELET
Abs Immature Granulocytes: 0.02 10*3/uL (ref 0.00–0.07)
Basophils Absolute: 0.1 10*3/uL (ref 0.0–0.1)
Basophils Relative: 1 %
Eosinophils Absolute: 0.2 10*3/uL (ref 0.0–0.5)
Eosinophils Relative: 3 %
HCT: 43.2 % (ref 36.0–46.0)
Hemoglobin: 13.6 g/dL (ref 12.0–15.0)
Immature Granulocytes: 0 %
Lymphocytes Relative: 31 %
Lymphs Abs: 2.3 10*3/uL (ref 0.7–4.0)
MCH: 25.6 pg — ABNORMAL LOW (ref 26.0–34.0)
MCHC: 31.5 g/dL (ref 30.0–36.0)
MCV: 81.4 fL (ref 80.0–100.0)
Monocytes Absolute: 0.5 10*3/uL (ref 0.1–1.0)
Monocytes Relative: 7 %
Neutro Abs: 4.3 10*3/uL (ref 1.7–7.7)
Neutrophils Relative %: 58 %
Platelets: 278 10*3/uL (ref 150–400)
RBC: 5.31 MIL/uL — ABNORMAL HIGH (ref 3.87–5.11)
RDW: 15.5 % (ref 11.5–15.5)
WBC: 7.5 10*3/uL (ref 4.0–10.5)
nRBC: 0 % (ref 0.0–0.2)

## 2021-01-21 LAB — PROTIME-INR
INR: 1 (ref 0.8–1.2)
Prothrombin Time: 12.5 seconds (ref 11.4–15.2)

## 2021-01-21 LAB — BASIC METABOLIC PANEL
Anion gap: 6 (ref 5–15)
BUN: 20 mg/dL (ref 8–23)
CO2: 30 mmol/L (ref 22–32)
Calcium: 9.6 mg/dL (ref 8.9–10.3)
Chloride: 104 mmol/L (ref 98–111)
Creatinine, Ser: 0.71 mg/dL (ref 0.44–1.00)
GFR, Estimated: >60 mL/min (ref >60)
Glucose, Bld: 83 mg/dL (ref 70–99)
Potassium: 4.5 mmol/L (ref 3.5–5.1)
Sodium: 140 mmol/L (ref 135–145)

## 2021-01-21 LAB — HEMOGLOBIN A1C
Hgb A1c MFr Bld: 6.3 % — ABNORMAL HIGH (ref 4.8–5.6)
Mean Plasma Glucose: 134.11 mg/dL

## 2021-01-21 LAB — URINALYSIS, ROUTINE W REFLEX MICROSCOPIC
Bilirubin Urine: NEGATIVE
Glucose, UA: NEGATIVE mg/dL
Hgb urine dipstick: NEGATIVE
Ketones, ur: NEGATIVE mg/dL
Leukocytes,Ua: NEGATIVE
Nitrite: NEGATIVE
Protein, ur: NEGATIVE mg/dL
Specific Gravity, Urine: 1.014 (ref 1.005–1.030)
pH: 6 (ref 5.0–8.0)

## 2021-01-21 LAB — GLUCOSE, CAPILLARY: Glucose-Capillary: 93 mg/dL (ref 70–99)

## 2021-01-21 LAB — SURGICAL PCR SCREEN
MRSA, PCR: NEGATIVE
Staphylococcus aureus: NEGATIVE

## 2021-01-21 LAB — APTT: aPTT: 32 seconds (ref 24–36)

## 2021-01-21 MED ORDER — CYCLOBENZAPRINE HCL 10 MG PO TABS: 10.0000 mg | ORAL_TABLET | Freq: Every day | ORAL | 0 refills | Status: DC
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
## 2021-01-21 NOTE — Care Plan (Signed)
Ortho Bundle Case Management Note  Patient Details  Name: Cathy [name] MRN: [id number] Date of Birth: 01-26-1946   Spoke with patient prior to surgery. She will discharge to home with family to assist. Has equipment at home. OPPT set up with [facility]. Patient and MD in agreement with plan. Choice offered.                   DME Arranged:    DME Agency:     HH Arranged:    HH Agency:     Additional Comments: Please contact me with any questions of if this plan should need to change.  Ladell Heads,  [place] [facility]  [phone] 01/21/2021, 11:35 AM

## 2021-01-21 NOTE — Progress Notes (Signed)
COVID Vaccine Completed:yes Date COVID Vaccine completed:12/14/19- booster 09/16/20 COVID vaccine manufacturer:   Moderna     PCP - [name] Cardiologist - none  Chest x-ray - no EKG - 01/02/21-epic Stress Test - no ECHO - no Cardiac Cath - no Pacemaker/ICD device last checked:NA  Sleep Study - no CPAP -   Fasting Blood Sugar - 94-122 she is diet controlled and not on meds Checks Blood Sugar _____ times a day 2-3 times a week.   Blood Thinner Instructions:ASA81/ C. Nafziger Aspirin Instructions:stop 5 days prior Last Dose:01/21/21  Anesthesia review:   Patient denies shortness of breath, fever, cough and chest pain at PAT appointment yes  Patient verbalized understanding of instructions that were given to them at the PAT appointment. Patient was also instructed that they will need to review over the PAT instructions again at home before surgery.yes  Pt doesn't climb stairs but no SOB doing housework or with ADLs

## 2021-01-24 ENCOUNTER — Other Ambulatory Visit (HOSPITAL_COMMUNITY)
Admission: RE | Admit: 2021-01-24 | Discharge: 2021-01-24 | Disposition: A | Discharge status: Home / Self Care | Payer: Medicare Other | Source: Ambulatory Visit | Attending: Orthopaedic Surgery | Admitting: Orthopaedic Surgery

## 2021-01-24 DIAGNOSIS — Z20822 Contact with and (suspected) exposure to covid-19: Secondary | ICD-10-CM | POA: Diagnosis not present

## 2021-01-24 DIAGNOSIS — Z01812 Encounter for preprocedural laboratory examination: Secondary | ICD-10-CM | POA: Diagnosis not present

## 2021-01-24 LAB — SARS CORONAVIRUS 2 (TAT 6-24 HRS): SARS Coronavirus 2: NEGATIVE

## 2021-01-27 MED ORDER — TRANEXAMIC ACID 1000 MG/10ML IV SOLN
2000.0000 mg | INTRAVENOUS | Status: DC
  Filled 2021-01-27: qty 20

## 2021-01-27 MED ORDER — BUPIVACAINE LIPOSOME 1.3 % IJ SUSP
10.0000 mL | Freq: Once | INTRAMUSCULAR | Status: DC
  Filled 2021-01-27: qty 10

## 2021-01-27 NOTE — Anesthesia Preprocedure Evaluation (Addendum)
Anesthesia Evaluation  Patient identified by MRN, date of birth, ID band Patient awake    Reviewed: Allergy & Precautions, H&P , NPO status , Patient's Chart, lab work & pertinent test results  Airway Mallampati: II  TM Distance: >3 FB Neck ROM: Full    Dental no notable dental hx.    Pulmonary neg pulmonary ROS, former smoker,    Pulmonary exam normal breath sounds clear to auscultation       Cardiovascular hypertension, Pt. on medications Normal cardiovascular exam Rhythm:Regular Rate:Normal     Neuro/Psych negative neurological ROS  negative psych ROS   GI/Hepatic negative GI ROS, Neg liver ROS,   Endo/Other  negative endocrine ROS  Renal/GU negative Renal ROS  negative genitourinary   Musculoskeletal negative musculoskeletal ROS (+)   Abdominal   Peds negative pediatric ROS (+)  Hematology negative hematology ROS (+)   Anesthesia Other Findings   Reproductive/Obstetrics negative OB ROS                            Anesthesia Physical Anesthesia Plan  ASA: II  Anesthesia Plan: General   Post-op Pain Management:    Induction: Intravenous  PONV Risk Score and Plan: 3 and Ondansetron, Dexamethasone and Treatment may vary due to age or medical condition  Airway Management Planned: Oral ETT  Additional Equipment:   Intra-op Plan:   Post-operative Plan: Extubation in OR  Informed Consent: I have reviewed the patients History and Physical, chart, labs and discussed the procedure including the risks, benefits and alternatives for the proposed anesthesia with the patient or authorized representative who has indicated his/her understanding and acceptance.     Dental advisory given  Plan Discussed with: CRNA and Surgeon  Anesthesia Plan Comments:         Anesthesia Quick Evaluation

## 2021-01-27 NOTE — H&P (Signed)
TOTAL HIP ADMISSION H&P  Patient is admitted for left total hip arthroplasty.  Subjective:  Chief Complaint: left hip pain  HPI: Cathy [name], 75 y.o. female, has a history of pain and functional disability in the left hip(s) due to arthritis and patient has failed non-surgical conservative treatments for greater than 12 weeks to include NSAID's and/or analgesics, corticosteriod injections, flexibility and strengthening excercises, supervised PT with diminished ADL's post treatment, use of assistive devices, weight reduction as appropriate and activity modification.  Onset of symptoms was gradual starting 5 years ago with gradually worsening course since that time.The patient noted no past surgery on the left hip(s).  Patient currently rates pain in the left hip at 10 out of 10 with activity. Patient has night pain, worsening of pain with activity and weight bearing, trendelenberg gait, pain that interfers with activities of daily living and crepitus. Patient has evidence of subchondral cysts, subchondral sclerosis, periarticular osteophytes and joint space narrowing by imaging studies. This condition presents safety issues increasing the risk of falls. There is no current active infection.  Patient Active Problem List   Diagnosis Date Noted  . Primary localized osteoarthritis of right hip 04/09/2020  . Primary osteoarthritis of right hip 04/09/2020  . Arthritis of right hip 02/19/2020  . Greater trochanteric bursitis of both hips 12/19/2019  . Greater trochanteric bursitis of left hip 10/03/2019  . Arthritis of left hip 10/03/2019  . Left groin pain 03/01/2019  . Right shoulder pain 12/27/2018  . Chronic bilateral low back pain with right-sided sciatica 01/14/2018  . Urinary frequency 01/14/2018  . Degenerative arthritis of knee, bilateral 10/21/2017  . Arthritis of knee, left 08/03/2017  . GERD (gastroesophageal reflux disease) 03/27/2015  . Preventative health care 01/04/2015  .  Constipation 07/15/2012  . Allergic rhinitis 04/01/2012  . Diabetes mellitus type 2, controlled ([place]) 10/14/2007  . CARPAL TUNNEL SYNDROME 10/14/2007  . Dyslipidemia 10/13/2007  . Essential hypertension 10/13/2007  . Depression, recurrent ([place]) 10/13/2007   Past Medical History:  Diagnosis Date  . Chronic constipation   . DJD (degenerative joint disease)    knees  . Female cystocele   . GERD (gastroesophageal reflux disease)   . History of esophagitis   . History of left inguinal hernia   . History of MRSA infection    recurrent carbuncle  . History of recurrent UTIs   . Hyperlipidemia   . Hypertension   . Pre-diabetes    diet controlled  . Urgency of urination     Past Surgical History:  Procedure Laterality Date  . APPENDECTOMY  1980's  . BREAST EXCISIONAL BIOPSY Left 1989  . COLONOSCOPY  last one 06-18-2015  . INGUINAL HERNIA REPAIR Left 05-10-2001  . LAPAROSCOPIC LYSIS OF ADHESIONS  01/17/2019  . OOPHORECTOMY Right 01/17/2019  . PERINEOPLASTY  01/17/2019  . SALPINGECTOMY Left 01/17/2019  . TOTAL HIP ARTHROPLASTY Right 04/09/2020   Procedure: RIGH TOTAL HIP ARTHROPLASTY ANTERIOR APPROACH;  Surgeon: Melrose Nakayama, MD;  Location: [facility];  Service: Orthopedics;  Laterality: Right;  [name] VAGINAL HYSTERECTOMY  1980's  . VAGINAL PROLAPSE REPAIR N/A 03/02/2016   Procedure: COLOPLAST ANTERIOR  VAULT REPAIR WITH AXIS DERMIS. SACROSPINUS FIXATION, AUGMENTATION WITH AXIS DERMIS;  Surgeon: Carolan Clines, MD;  Location: [facility];  Service: Urology;  Laterality: N/A;    Current Facility-Administered Medications  Medication Dose Route Frequency Provider Last Rate Last Admin  . [START ON 01/28/2021] bupivacaine liposome (EXPAREL) 1.3 % injection 133 mg  10 mL Other Once Dalldorf,  Collier Salina, MD      . Derrill Memo ON 01/28/2021] tranexamic acid (CYKLOKAPRON) 2,000 mg in sodium chloride 0.9 % 50 mL Topical Application  6,384 mg Topical To OR Melrose Nakayama, MD       Current  Outpatient Medications  Medication Sig Dispense Refill Last Dose  . Ascorbic Acid (VITAMIN C) 1000 MG tablet Take 1,000 mg by mouth daily.     [name] aspirin 81 MG tablet Take 1 tablet (81 mg total) by mouth 2 (two) times daily after a meal. (Patient taking differently: Take 81 mg by mouth daily.) 60 tablet 0   . b complex vitamins capsule Take 1 capsule by mouth daily.     . Chromium 1000 MCG TABS Take 1,000 mcg by mouth daily.     . Cinnamon 500 MG capsule Take 1,000 mg by mouth daily.     [name] ELDERBERRY PO Take 1 capsule by mouth daily.     [name] estradiol (ESTRACE) 0.1 MG/GM vaginal cream Place 1 Applicatorful vaginally every other day. In the evening  3   . lisinopril (ZESTRIL) 10 MG tablet TAKE 1 TABLET(10 MG) BY MOUTH DAILY (Patient taking differently: Take 10 mg by mouth daily.) 90 tablet 3   . meloxicam (MOBIC) 15 MG tablet Take 15 mg by mouth daily as needed for pain.     . Multiple Vitamins-Minerals (MULTIVITAMIN WITH MINERALS) tablet Take 1 tablet by mouth daily. Centrum silver     . Omega-3 1000 MG CAPS Take 1,000 mg by mouth daily.     . polyethylene glycol (MIRALAX / GLYCOLAX) packet Take 17 g by mouth daily as needed for mild constipation.     . Turmeric 500 MG CAPS Take 1,000 mg by mouth daily.      . Biotin 1000 MCG tablet Take 1,000 mcg by mouth daily. (Patient not taking: Reported on 01/10/2021)   Not Taking at Unknown time  . blood glucose meter kit and supplies KIT Dispense based on patient and insurance preference. Use once daily to test blood glucose. (FOR ICD-E11.9). 1 each 0   . Calcium Carbonate-Vitamin D 600-400 MG-UNIT tablet Take 1 tablet by mouth daily. (Patient not taking: Reported on 01/10/2021)   Not Taking at Unknown time  . CRANBERRY PO Take 2 tablets by mouth daily. 4200 mg (Patient not taking: Reported on 01/10/2021)   Not Taking at Unknown time  . Cyanocobalamin (VITAMIN B-12) 2500 MCG SUBL Place 2,500 mcg under the tongue daily. (Patient not taking: Reported on 01/10/2021)    Not Taking at Unknown time  . cyclobenzaprine (FLEXERIL) 10 MG tablet Take 1 tablet (10 mg total) by mouth at bedtime. 5 tablet 0   . fexofenadine (ALLEGRA) 180 MG tablet Take 180 mg by mouth daily. (Patient not taking: Reported on 01/10/2021)   Not Taking at Unknown time  . fluticasone (FLONASE) 50 MCG/ACT nasal spray SHAKE LIQUID AND USE 2 SPRAYS IN EACH NOSTRIL DAILY (Patient not taking: Reported on 01/10/2021) 48 g 2 Not Taking at Unknown time  . gabapentin (NEURONTIN) 100 MG capsule TAKE 2 CAPSULES(200 MG) BY MOUTH AT BEDTIME (Patient not taking: Reported on 01/10/2021) 180 capsule 0 Not Taking at Unknown time  . Lancets (ONETOUCH DELICA PLUS [id number]) MISC USE ONCE DAILY 100 each 3   . Misc Natural Products (TART CHERRY ADVANCED) CAPS Take 2 capsules by mouth daily.  (Patient not taking: Reported on 01/10/2021)   Not Taking at Unknown time  . ONETOUCH VERIO test strip USE TO TEST BLOOD GLUCOSE TWICE  DAILY 200 strip 3   . OVER THE COUNTER MEDICATION Take 1 tablet by mouth daily as needed (constipation). Vital Lax (Patient not taking: Reported on 01/10/2021)   Not Taking at Unknown time  . rosuvastatin (CRESTOR) 40 MG tablet TAKE 1 TABLET(40 MG) BY MOUTH DAILY 90 tablet 1   . traZODone (DESYREL) 50 MG tablet TAKE 1/2 TO 1 TABLET(25 TO 50 MG) BY MOUTH AT BEDTIME AS NEEDED FOR SLEEP (Patient not taking: Reported on 01/10/2021) 30 tablet 3 Not Taking at Unknown time  . trimethoprim (TRIMPEX) 100 MG tablet Take 100 mg by mouth as needed. (Patient not taking: Reported on 01/10/2021)   Not Taking at Unknown time   Allergies  Allergen Reactions  . Penicillins Hives    Has patient had a PCN reaction causing immediate rash, facial/tongue/throat swelling, SOB or lightheadedness with hypotension: Yes Has patient had a PCN reaction causing severe rash involving mucus membranes or skin necrosis: No Has patient had a PCN reaction that required hospitalization: No Has patient had a PCN reaction occurring within  the last 10 years: No If all of the above answers are "NO", then may proceed with Cephalosporin use.     Social History   Tobacco Use  . Smoking status: Former Smoker    Years: 1.00    Types: Cigarettes    Quit date: 02/28/1996    Years since quitting: 24.9  . Smokeless tobacco: Never Used  Substance Use Topics  . Alcohol use: Yes    Alcohol/week: 4.0 standard drinks    Types: 4 Standard drinks or equivalent per week    Comment: on weekends    Family History  Problem Relation Age of Onset  . Aneurysm Mother 20       deceased secondary to brain aneurysm  . Liver cancer Father 38       deceased  . Diabetes Other        grandmother  . Colon cancer Neg Hx   . Stomach cancer Neg Hx   . Rectal cancer Neg Hx   . Esophageal cancer Neg Hx   . Breast cancer Neg Hx      Review of Systems  Musculoskeletal: Positive for arthralgias.       Left hip pain  All other systems reviewed and are negative.   Objective:  Physical Exam Constitutional:      Appearance: Normal appearance.  HENT:     Head: Normocephalic and atraumatic.     Nose: Nose normal.     Mouth/Throat:     Pharynx: Oropharynx is clear.  Eyes:     Extraocular Movements: Extraocular movements intact.  Cardiovascular:     Rate and Rhythm: Normal rate.  Pulmonary:     Effort: Pulmonary effort is normal.  Abdominal:     Palpations: Abdomen is soft.  Musculoskeletal:     Cervical back: Normal range of motion.     Comments: Examination of the left hip shows significantly limited range of motion with terrible pain at end ranges of motion.  Normal sensation throughout her leg.  Right hip range of motion is full without pain.  She is neurovascularly intact distally.    Skin:    General: Skin is warm and dry.  Neurological:     General: No focal deficit present.     Mental Status: She is alert and oriented to person, place, and time. Mental status is at baseline.  Psychiatric:        Mood and Affect: Mood normal.  Behavior: Behavior normal.        Thought Content: Thought content normal.        Judgment: Judgment normal.     Vital signs in last 24 hours:    Labs:   Estimated body mass index is 24.41 kg/m as calculated from the following:   Height as of 01/21/21: _0  (1.626 m).   Weight as of 01/21/21: 64.5 kg.   Imaging Review Plain radiographs demonstrate severe degenerative joint disease of the left hip(s). The bone quality appears to be good for age and reported activity level.      Assessment/Plan:  End stage primary arthritis, left hip(s)  The patient history, physical examination, clinical judgement of the provider and imaging studies are consistent with end stage degenerative joint disease of the left hip(s) and total hip arthroplasty is deemed medically necessary. The treatment options including medical management, injection therapy, arthroscopy and arthroplasty were discussed at length. The risks and benefits of total hip arthroplasty were presented and reviewed. The risks due to aseptic loosening, infection, stiffness, dislocation/subluxation,  thromboembolic complications and other imponderables were discussed.  The patient acknowledged the explanation, agreed to proceed with the plan and consent was signed. Patient is being admitted for inpatient treatment for surgery, pain control, PT, OT, prophylactic antibiotics, VTE prophylaxis, progressive ambulation and ADL's and discharge planning.The patient is planning to be discharged home with home health services

## 2021-01-28 ENCOUNTER — Ambulatory Visit (HOSPITAL_COMMUNITY): Payer: Medicare Other

## 2021-01-28 ENCOUNTER — Encounter (HOSPITAL_COMMUNITY): Payer: Self-pay | Admitting: Orthopaedic Surgery

## 2021-01-28 ENCOUNTER — Ambulatory Visit (HOSPITAL_COMMUNITY): Payer: Medicare Other | Admitting: Certified Registered Nurse Anesthetist

## 2021-01-28 ENCOUNTER — Observation Stay (HOSPITAL_COMMUNITY)
Admission: RE | Admit: 2021-01-28 | Discharge: 2021-01-29 | Disposition: A | Discharge status: Home / Self Care | Payer: Medicare Other | Source: Ambulatory Visit | Attending: Orthopaedic Surgery | Admitting: Orthopaedic Surgery

## 2021-01-28 ENCOUNTER — Other Ambulatory Visit: Payer: Self-pay

## 2021-01-28 ENCOUNTER — Encounter (HOSPITAL_COMMUNITY)
Admission: RE | Disposition: A | Discharge status: Home / Self Care | Payer: Self-pay | Source: Ambulatory Visit | Attending: Orthopaedic Surgery

## 2021-01-28 DIAGNOSIS — J309 Allergic rhinitis, unspecified: Secondary | ICD-10-CM | POA: Diagnosis not present

## 2021-01-28 DIAGNOSIS — M1612 Unilateral primary osteoarthritis, left hip: Secondary | ICD-10-CM | POA: Diagnosis not present

## 2021-01-28 DIAGNOSIS — I10 Essential (primary) hypertension: Secondary | ICD-10-CM | POA: Diagnosis not present

## 2021-01-28 DIAGNOSIS — Z7982 Long term (current) use of aspirin: Secondary | ICD-10-CM | POA: Diagnosis not present

## 2021-01-28 DIAGNOSIS — Z96642 Presence of left artificial hip joint: Secondary | ICD-10-CM | POA: Diagnosis not present

## 2021-01-28 DIAGNOSIS — Z96641 Presence of right artificial hip joint: Secondary | ICD-10-CM | POA: Insufficient documentation

## 2021-01-28 DIAGNOSIS — E785 Hyperlipidemia, unspecified: Secondary | ICD-10-CM | POA: Diagnosis not present

## 2021-01-28 DIAGNOSIS — Z471 Aftercare following joint replacement surgery: Secondary | ICD-10-CM | POA: Diagnosis not present

## 2021-01-28 DIAGNOSIS — E119 Type 2 diabetes mellitus without complications: Secondary | ICD-10-CM | POA: Diagnosis not present

## 2021-01-28 DIAGNOSIS — Z419 Encounter for procedure for purposes other than remedying health state, unspecified: Secondary | ICD-10-CM

## 2021-01-28 DIAGNOSIS — Z79899 Other long term (current) drug therapy: Secondary | ICD-10-CM | POA: Insufficient documentation

## 2021-01-28 HISTORY — PX: TOTAL HIP ARTHROPLASTY: SHX124

## 2021-01-28 LAB — TYPE AND SCREEN
ABO/RH(D): O POS
Antibody Screen: NEGATIVE

## 2021-01-28 LAB — GLUCOSE, CAPILLARY
Glucose-Capillary: 111 mg/dL — ABNORMAL HIGH (ref 70–99)
Glucose-Capillary: 150 mg/dL — ABNORMAL HIGH (ref 70–99)

## 2021-01-28 SURGERY — ARTHROPLASTY, HIP, TOTAL, ANTERIOR APPROACH
Anesthesia: General | Site: Hip | Laterality: Left

## 2021-01-28 MED ORDER — ONDANSETRON HCL 4 MG PO TABS: 4.0000 mg | ORAL_TABLET | Freq: Four times a day (QID) | ORAL | Status: DC | PRN

## 2021-01-28 MED ORDER — TRANEXAMIC ACID-NACL 1000-0.7 MG/100ML-% IV SOLN
1000.0000 mg | Freq: Once | INTRAVENOUS | Status: AC
  Administered 2021-01-28: 1000 mg via INTRAVENOUS
  Filled 2021-01-28: qty 100

## 2021-01-28 MED ORDER — ONDANSETRON HCL 4 MG/2ML IJ SOLN: 4.0000 mg | Freq: Once | INTRAMUSCULAR | Status: DC | PRN

## 2021-01-28 MED ORDER — ONDANSETRON HCL 4 MG/2ML IJ SOLN
INTRAMUSCULAR | Status: AC
  Filled 2021-01-28: qty 2

## 2021-01-28 MED ORDER — OXYCODONE HCL 5 MG PO TABS: 5.0000 mg | ORAL_TABLET | Freq: Once | ORAL | Status: DC | PRN

## 2021-01-28 MED ORDER — DOCUSATE SODIUM 100 MG PO CAPS
100.0000 mg | ORAL_CAPSULE | Freq: Two times a day (BID) | ORAL | Status: DC
  Administered 2021-01-28 – 2021-01-29 (×3): 100 mg via ORAL
  Filled 2021-01-28 (×3): qty 1

## 2021-01-28 MED ORDER — ALUM & MAG HYDROXIDE-SIMETH 200-200-20 MG/5ML PO SUSP: 30.0000 mL | ORAL | Status: DC | PRN

## 2021-01-28 MED ORDER — LISINOPRIL 10 MG PO TABS
10.0000 mg | ORAL_TABLET | Freq: Every day | ORAL | Status: DC
  Administered 2021-01-28 – 2021-01-29 (×2): 10 mg via ORAL
  Filled 2021-01-28 (×2): qty 1

## 2021-01-28 MED ORDER — DEXAMETHASONE SODIUM PHOSPHATE 10 MG/ML IJ SOLN
INTRAMUSCULAR | Status: AC
  Filled 2021-01-28: qty 1

## 2021-01-28 MED ORDER — PROPOFOL 10 MG/ML IV BOLUS
INTRAVENOUS | Status: AC
  Filled 2021-01-28: qty 20

## 2021-01-28 MED ORDER — LACTATED RINGERS IV SOLN: INTRAVENOUS | Status: DC

## 2021-01-28 MED ORDER — POVIDONE-IODINE 10 % EX SWAB
2.0000 "application " | Freq: Once | CUTANEOUS | Status: AC
  Administered 2021-01-28: 2 via TOPICAL

## 2021-01-28 MED ORDER — METOCLOPRAMIDE HCL 5 MG PO TABS: 5.0000 mg | ORAL_TABLET | Freq: Three times a day (TID) | ORAL | Status: DC | PRN

## 2021-01-28 MED ORDER — DEXAMETHASONE SODIUM PHOSPHATE 4 MG/ML IJ SOLN
INTRAMUSCULAR | Status: DC | PRN
  Administered 2021-01-28: 5 mg via INTRAVENOUS

## 2021-01-28 MED ORDER — ONDANSETRON HCL 4 MG/2ML IJ SOLN: 4.0000 mg | Freq: Four times a day (QID) | INTRAMUSCULAR | Status: DC | PRN

## 2021-01-28 MED ORDER — 0.9 % SODIUM CHLORIDE (POUR BTL) OPTIME
TOPICAL | Status: DC | PRN
  Administered 2021-01-28: 1000 mL

## 2021-01-28 MED ORDER — MENTHOL 3 MG MT LOZG: 1.0000 | LOZENGE | OROMUCOSAL | Status: DC | PRN

## 2021-01-28 MED ORDER — ROCURONIUM BROMIDE 10 MG/ML (PF) SYRINGE
PREFILLED_SYRINGE | INTRAVENOUS | Status: DC | PRN
  Administered 2021-01-28: 70 mg via INTRAVENOUS

## 2021-01-28 MED ORDER — VANCOMYCIN HCL IN DEXTROSE 1-5 GM/200ML-% IV SOLN
1000.0000 mg | INTRAVENOUS | Status: AC
  Administered 2021-01-28 (×2): 1000 mg via INTRAVENOUS
  Filled 2021-01-28: qty 200

## 2021-01-28 MED ORDER — ONDANSETRON HCL 4 MG/2ML IJ SOLN
INTRAMUSCULAR | Status: DC | PRN
  Administered 2021-01-28: 4 mg via INTRAVENOUS

## 2021-01-28 MED ORDER — HYDROMORPHONE HCL 1 MG/ML IJ SOLN: 0.2500 mg | INTRAMUSCULAR | Status: DC | PRN

## 2021-01-28 MED ORDER — FENTANYL CITRATE (PF) 100 MCG/2ML IJ SOLN
INTRAMUSCULAR | Status: DC | PRN
  Administered 2021-01-28 (×2): 100 ug via INTRAVENOUS
  Administered 2021-01-28: 50 ug via INTRAVENOUS

## 2021-01-28 MED ORDER — CHLORHEXIDINE GLUCONATE 0.12 % MT SOLN
15.0000 mL | Freq: Once | OROMUCOSAL | Status: AC
  Administered 2021-01-28: 15 mL via OROMUCOSAL

## 2021-01-28 MED ORDER — SUGAMMADEX SODIUM 200 MG/2ML IV SOLN
INTRAVENOUS | Status: DC | PRN
  Administered 2021-01-28: 200 mg via INTRAVENOUS

## 2021-01-28 MED ORDER — DIPHENHYDRAMINE HCL 12.5 MG/5ML PO ELIX: 12.5000 mg | ORAL_SOLUTION | ORAL | Status: DC | PRN

## 2021-01-28 MED ORDER — PROPOFOL 10 MG/ML IV BOLUS
INTRAVENOUS | Status: DC | PRN
  Administered 2021-01-28: 120 mg via INTRAVENOUS

## 2021-01-28 MED ORDER — FENTANYL CITRATE (PF) 250 MCG/5ML IJ SOLN
INTRAMUSCULAR | Status: AC
  Filled 2021-01-28: qty 5

## 2021-01-28 MED ORDER — TRANEXAMIC ACID-NACL 1000-0.7 MG/100ML-% IV SOLN
1000.0000 mg | INTRAVENOUS | Status: AC
  Administered 2021-01-28: 1000 mg via INTRAVENOUS
  Filled 2021-01-28: qty 100

## 2021-01-28 MED ORDER — KETOROLAC TROMETHAMINE 15 MG/ML IJ SOLN
7.5000 mg | Freq: Four times a day (QID) | INTRAMUSCULAR | Status: AC
  Administered 2021-01-28 – 2021-01-29 (×4): 7.5 mg via INTRAVENOUS
  Filled 2021-01-28 (×4): qty 1

## 2021-01-28 MED ORDER — BISACODYL 5 MG PO TBEC: 5.0000 mg | DELAYED_RELEASE_TABLET | Freq: Every day | ORAL | Status: DC | PRN

## 2021-01-28 MED ORDER — PHENYLEPHRINE 40 MCG/ML (10ML) SYRINGE FOR IV PUSH (FOR BLOOD PRESSURE SUPPORT)
PREFILLED_SYRINGE | INTRAVENOUS | Status: AC
  Filled 2021-01-28: qty 10

## 2021-01-28 MED ORDER — ACETAMINOPHEN 325 MG PO TABS: 325.0000 mg | ORAL_TABLET | Freq: Four times a day (QID) | ORAL | Status: DC | PRN

## 2021-01-28 MED ORDER — EPHEDRINE 5 MG/ML INJ
INTRAVENOUS | Status: AC
  Filled 2021-01-28: qty 10

## 2021-01-28 MED ORDER — ORAL CARE MOUTH RINSE: 15.0000 mL | Freq: Once | OROMUCOSAL | Status: AC

## 2021-01-28 MED ORDER — METHOCARBAMOL 1000 MG/10ML IJ SOLN
500.0000 mg | Freq: Four times a day (QID) | INTRAVENOUS | Status: DC | PRN
  Filled 2021-01-28: qty 5

## 2021-01-28 MED ORDER — METHOCARBAMOL 500 MG PO TABS
500.0000 mg | ORAL_TABLET | Freq: Four times a day (QID) | ORAL | Status: DC | PRN
  Administered 2021-01-28: 500 mg via ORAL
  Filled 2021-01-28: qty 1

## 2021-01-28 MED ORDER — METOCLOPRAMIDE HCL 5 MG/ML IJ SOLN: 5.0000 mg | Freq: Three times a day (TID) | INTRAMUSCULAR | Status: DC | PRN

## 2021-01-28 MED ORDER — ASPIRIN 81 MG PO CHEW
81.0000 mg | CHEWABLE_TABLET | Freq: Two times a day (BID) | ORAL | Status: DC
  Filled 2021-01-28: qty 1

## 2021-01-28 MED ORDER — HYDROCODONE-ACETAMINOPHEN 7.5-325 MG PO TABS
1.0000 | ORAL_TABLET | ORAL | Status: DC | PRN
  Administered 2021-01-29: 2 via ORAL
  Filled 2021-01-28: qty 2

## 2021-01-28 MED ORDER — OXYCODONE HCL 5 MG/5ML PO SOLN
5.0000 mg | Freq: Once | ORAL | Status: DC | PRN
Start: 2021-01-28 — End: 2021-01-28

## 2021-01-28 MED ORDER — BUPIVACAINE-EPINEPHRINE (PF) 0.25% -1:200000 IJ SOLN
INTRAMUSCULAR | Status: DC | PRN
  Administered 2021-01-28: 30 mL

## 2021-01-28 MED ORDER — VANCOMYCIN HCL 1000 MG/200ML IV SOLN
1000.0000 mg | Freq: Two times a day (BID) | INTRAVENOUS | Status: AC
  Administered 2021-01-28: 1000 mg via INTRAVENOUS
  Filled 2021-01-28: qty 200

## 2021-01-28 MED ORDER — ROSUVASTATIN CALCIUM 20 MG PO TABS
40.0000 mg | ORAL_TABLET | Freq: Every day | ORAL | Status: DC
  Administered 2021-01-28 – 2021-01-29 (×2): 40 mg via ORAL
  Filled 2021-01-28 (×2): qty 2

## 2021-01-28 MED ORDER — BUPIVACAINE LIPOSOME 1.3 % IJ SUSP
INTRAMUSCULAR | Status: DC | PRN
  Administered 2021-01-28: 10 mL

## 2021-01-28 MED ORDER — MORPHINE SULFATE (PF) 2 MG/ML IV SOLN: 0.5000 mg | INTRAVENOUS | Status: DC | PRN

## 2021-01-28 MED ORDER — LIDOCAINE 2% (20 MG/ML) 5 ML SYRINGE
INTRAMUSCULAR | Status: DC | PRN
  Administered 2021-01-28: 60 mg via INTRAVENOUS

## 2021-01-28 MED ORDER — HYDROMORPHONE HCL 1 MG/ML IJ SOLN
INTRAMUSCULAR | Status: DC | PRN
  Administered 2021-01-28: .5 mg via INTRAVENOUS

## 2021-01-28 MED ORDER — BUPIVACAINE-EPINEPHRINE (PF) 0.25% -1:200000 IJ SOLN
INTRAMUSCULAR | Status: AC
  Filled 2021-01-28: qty 30

## 2021-01-28 MED ORDER — ACETAMINOPHEN 500 MG PO TABS
500.0000 mg | ORAL_TABLET | Freq: Four times a day (QID) | ORAL | Status: AC
  Administered 2021-01-28 – 2021-01-29 (×4): 500 mg via ORAL
  Filled 2021-01-28 (×4): qty 1

## 2021-01-28 MED ORDER — HYDROMORPHONE HCL 2 MG/ML IJ SOLN
INTRAMUSCULAR | Status: AC
  Filled 2021-01-28: qty 1

## 2021-01-28 MED ORDER — TRANEXAMIC ACID 1000 MG/10ML IV SOLN
INTRAVENOUS | Status: DC | PRN
  Administered 2021-01-28: 2000 mg via TOPICAL

## 2021-01-28 MED ORDER — HYDROCODONE-ACETAMINOPHEN 5-325 MG PO TABS
1.0000 | ORAL_TABLET | ORAL | Status: DC | PRN
  Administered 2021-01-28: 2 via ORAL
  Filled 2021-01-28: qty 2

## 2021-01-28 MED ORDER — PHENOL 1.4 % MT LIQD: 1.0000 | OROMUCOSAL | Status: DC | PRN

## 2021-01-28 SURGICAL SUPPLY — 45 items
BAG DECANTER FOR FLEXI CONT (MISCELLANEOUS) ×2 IMPLANT
BLADE SAW SGTL 18X1.27X75 (BLADE) ×2 IMPLANT
BOOTIES KNEE HIGH SLOAN (MISCELLANEOUS) ×2 IMPLANT
CELLS DAT CNTRL 66122 CELL SVR (MISCELLANEOUS) ×1 IMPLANT
COVER PERINEAL POST (MISCELLANEOUS) ×2 IMPLANT
COVER SURGICAL LIGHT HANDLE (MISCELLANEOUS) ×2 IMPLANT
COVER WAND RF STERILE (DRAPES) IMPLANT
CUP GRIPTON 48MM 100 HIP (Hips) ×2 IMPLANT
DECANTER SPIKE VIAL GLASS SM (MISCELLANEOUS) IMPLANT
DRAPE IMP U-DRAPE 54X76 (DRAPES) ×2 IMPLANT
DRAPE ORTHO SPLIT 77X108 STRL (DRAPES)
DRAPE STERI IOBAN 125X83 (DRAPES) ×2 IMPLANT
DRAPE SURG ORHT 6 SPLT 77X108 (DRAPES) IMPLANT
DRAPE U-SHAPE 47X51 STRL (DRAPES) ×4 IMPLANT
DRSG AQUACEL AG ADV 3.5X 6 (GAUZE/BANDAGES/DRESSINGS) ×2 IMPLANT
DURAPREP 26ML APPLICATOR (WOUND CARE) ×2 IMPLANT
ELECT BLADE TIP CTD 4 INCH (ELECTRODE) ×2 IMPLANT
ELECT REM PT RETURN 15FT ADLT (MISCELLANEOUS) ×2 IMPLANT
ELIMINATOR HOLE APEX DEPUY (Hips) ×2 IMPLANT
GLOVE SRG 8 PF TXTR STRL LF DI (GLOVE) ×2 IMPLANT
GLOVE SURG ENC MOIS LTX SZ8 (GLOVE) ×4 IMPLANT
GLOVE SURG UNDER POLY LF SZ8 (GLOVE) ×4
GOWN STRL REUS W/TWL XL LVL3 (GOWN DISPOSABLE) ×4 IMPLANT
HEAD FEMORAL 32 CERAMIC (Hips) ×2 IMPLANT
HOLDER FOLEY CATH W/STRAP (MISCELLANEOUS) ×2 IMPLANT
KIT TURNOVER KIT A (KITS) ×2 IMPLANT
MANIFOLD NEPTUNE II (INSTRUMENTS) ×2 IMPLANT
NEEDLE HYPO 22GX1.5 SAFETY (NEEDLE) ×2 IMPLANT
NS IRRIG 1000ML POUR BTL (IV SOLUTION) ×2 IMPLANT
PACK ANTERIOR HIP CUSTOM (KITS) ×2 IMPLANT
PENCIL SMOKE EVACUATOR (MISCELLANEOUS) IMPLANT
PINN ALTRX NEUT ID X OD 32X48 ×2 IMPLANT
PROTECTOR NERVE ULNAR (MISCELLANEOUS) ×2 IMPLANT
RTRCTR WOUND ALEXIS 18CM MED (MISCELLANEOUS) ×2
STEM FEMORAL SZ 6MM STD ACTIS (Stem) ×2 IMPLANT
SUT ETHIBOND NAB CT1 #1 30IN (SUTURE) ×4 IMPLANT
SUT VIC AB 1 CT1 36 (SUTURE) ×2 IMPLANT
SUT VIC AB 2-0 CT1 27 (SUTURE) ×2
SUT VIC AB 2-0 CT1 TAPERPNT 27 (SUTURE) ×1 IMPLANT
SUT VICRYL AB 3-0 FS1 BRD 27IN (SUTURE) ×2 IMPLANT
SUT VLOC 180 0 24IN GS25 (SUTURE) ×2 IMPLANT
SYR 50ML LL SCALE MARK (SYRINGE) ×2 IMPLANT
TRAY FOLEY MTR SLVR 14FR STAT (SET/KITS/TRAYS/PACK) ×2 IMPLANT
TRAY FOLEY MTR SLVR 16FR STAT (SET/KITS/TRAYS/PACK) IMPLANT
WATER STERILE IRR 1000ML POUR (IV SOLUTION) ×4 IMPLANT

## 2021-01-28 NOTE — Evaluation (Signed)
Physical Therapy Evaluation Patient Details Name: JERYN BERTONI MRN: [id number] DOB: Oct 16, 1946 Today's Date: 01/28/2021   History of Present Illness  Patient is 75 y.o. female s/p Lt THA anterior approach with PMH significant for HTN, HLD, GERD.  Clinical Impression  Pt is a 75y.o. female s/p Lt TKA POD 0. Pt reports that she is independent with mobility at baseline. Therapist provided MIN assist for progression of Lt LE with supine to sit transfers. Pt required MIN guard and verbal cues for sit to stand transfers. Pt required MIN assist progressing to MIN guard for ambulation 22ft with cues for RW management and step to gait pattern with no LOB. PT reviewed therapeutic intervention for promotion of DVT prevention, pt demonstrated understanding. Pt will have assist from her son and his girlfriend at home. Pt will benefit from skilled PT to increase independence and safety with mobility. Acute therapy to follow up during stay to progress functional mobility as able to ensure safe discharge home.      Follow Up Recommendations Follow surgeon's recommendation for DC plan and follow-up therapies;Home health PT    Equipment Recommendations  None recommended by PT (pt owns RW)    Recommendations for Other Services       Precautions / Restrictions Precautions Precautions: Fall Restrictions Weight Bearing Restrictions: No Other Position/Activity Restrictions: WBAT      Mobility  Bed Mobility Overal bed mobility: Needs Assistance Bed Mobility: Supine to Sit     Supine to sit: Min assist;HOB elevated     General bed mobility comments: MIN assist for progression of Lt LE to EOB with cues for use of UEs on bed rails to assist.    Transfers Overall transfer level: Needs assistance Equipment used: Rolling walker (2 wheeled) Transfers: Sit to/from Stand Sit to Stand: Min guard         General transfer comment: MIN guard for safety with cues for safe hand  placement  Ambulation/Gait Ambulation/Gait assistance: Min assist;Min guard Gait Distance (Feet): 60 Feet Assistive device: Rolling walker (2 wheeled) Gait Pattern/deviations: Step-to pattern;Decreased stride length;Decreased weight shift to left Gait velocity: decr   General Gait Details: MIN assist progresssing to MIN guard for safety with cues for step to gait pattern with no LOB.  Stairs            Wheelchair Mobility    Modified Rankin (Stroke Patients Only)       Balance Overall balance assessment: Needs assistance Sitting-balance support: Feet supported Sitting balance-Leahy Scale: Good     Standing balance support: Bilateral upper extremity supported;During functional activity Standing balance-Leahy Scale: Poor Standing balance comment: use of RW to maintan standing balance                             Pertinent Vitals/Pain Pain Assessment: 0-10 Pain Score: 7  Pain Location: Rt hip Pain Intervention(s): Limited activity within patient's tolerance;Monitored during session;Repositioned;Ice applied    Home Living Family/patient expects to be discharged to:: Private residence Living Arrangements: Children Available Help at Discharge: Family;Available 24 hours/day Type of Home: House Home Access: Ramped entrance     Home Layout: One level Home Equipment: Walker - 4 wheels;Walker - 2 wheels;Cane - single point;Bedside commode Additional Comments: pt lives with her son and will also have assist from his girlfriend.    Prior Function Level of Independence: Independent               Hand Dominance  Dominant Hand: Right    Extremity/Trunk Assessment             Cervical / Trunk Assessment Cervical / Trunk Assessment: Normal  Communication   Communication: No difficulties  Cognition Arousal/Alertness: Awake/alert Behavior During Therapy: WFL for tasks assessed/performed Overall Cognitive Status: Within Functional Limits for  tasks assessed                                        General Comments      Exercises Total Joint Exercises Ankle Circles/Pumps: AROM;Both;20 reps;Seated   Assessment/Plan    PT Assessment Patient needs continued PT services  PT Problem List Decreased strength;Decreased range of motion;Decreased activity tolerance;Decreased balance;Decreased mobility;Decreased knowledge of use of DME;Pain       PT Treatment Interventions DME instruction;Gait training;Functional mobility training;Therapeutic activities;Therapeutic exercise;Balance training;Patient/family education    PT Goals (Current goals can be found in the Care Plan section)  Acute Rehab PT Goals Patient Stated Goal: get back to attending social outings with friends PT Goal Formulation: With patient Time For Goal Achievement: 02/04/21 Potential to Achieve Goals: Good    Frequency 7X/week   Barriers to discharge        Co-evaluation               AM-PAC PT "6 Clicks" Mobility  Outcome Measure Help needed turning from your back to your side while in a flat bed without using bedrails?: None Help needed moving from lying on your back to sitting on the side of a flat bed without using bedrails?: A Little Help needed moving to and from a bed to a chair (including a wheelchair)?: A Little Help needed standing up from a chair using your arms (e.g., wheelchair or bedside chair)?: A Little Help needed to walk in hospital room?: A Little Help needed climbing 3-5 steps with a railing? : A Lot 6 Click Score: 18    End of Session Equipment Utilized During Treatment: Gait belt Activity Tolerance: Patient tolerated treatment well Patient left: in chair;with call bell/phone within reach;with chair alarm set Nurse Communication: Mobility status PT Visit Diagnosis: Unsteadiness on feet (R26.81);Muscle weakness (generalized) (M62.81);Pain Pain - Right/Left: Left Pain - part of body: Hip    Time:  3254-9826 PT Time Calculation (min) (ACUTE ONLY): 19 min   Charges:              Elna Breslow, SPT  Acute rehab    Elna Breslow 01/28/2021, 5:11 PM

## 2021-01-28 NOTE — Op Note (Signed)
PRE-OP DIAGNOSIS:  LEFT HIP DEGENERATIVE JOINT DISEASE POST-OP DIAGNOSIS: same PROCEDURE:  LEFT TOTAL HIP ARTHROPLASTY ANTERIOR APPROACH ANESTHESIA:  General SURGEON:  Melrose Nakayama MD ASSISTANT:  Loni Dolly PA-C   INDICATIONS FOR PROCEDURE:  The patient is a 75 y.o. female with a long history of a painful hip.  This has persisted despite multiple conservative measures.  The patient has persisted with pain and dysfunction making rest and activity difficult.  A total hip replacement is offered as surgical treatment.  Informed operative consent was obtained after discussion of possible complications including reaction to anesthesia, infection, neurovascular injury, dislocation, DVT, PE, and death.  The importance of the postoperative rehab program to optimize result was stressed with the patient.  SUMMARY OF FINDINGS AND PROCEDURE:  Under the above anesthesia through a anterior approach an the Hana table a left THR was performed.  The patient had severe degenerative change and good bone quality.  We used DePuy components to replace the hip and these were size 6 standard Actis femur capped with a +1 20mm ceramic hip ball.  On the acetabular side we used a size 48 Gription shell with a plus 4 polyethylene liner.  We did use a hole eliminator.  Loni Dolly PA-C assisted throughout and was invaluable to the completion of the case in that he helped position and retract while I performed the procedure.  He also closed simultaneously to help minimize OR time.  I used fluoroscopy throughout the case to check position of components and leg lengths and read all these views myself.  DESCRIPTION OF PROCEDURE:  The patient was taken to the OR suite where the above anesthetic was applied.  The patient was then positioned on the Hana table supine.  All bony prominences were appropriately padded.  Prep and drape was then performed in normal sterile fashion.  The patient was given vancomycin preoperative antibiotic and  an appropriate time out was performed.  We then took an anterior approach to the left hip.  Dissection was taken through adipose to the tensor fascia lata fascia.  This structure was incised longitudinally and we dissected in the intermuscular interval just medial to this muscle.  Cobra retractors were placed superior and inferior to the femoral neck superficial to the capsule.  A capsular incision was then made and the retractors were placed along the femoral neck.  Xray was brought in to get a good level for the femoral neck cut which was made with an oscillating saw and osteotome.  The femoral head was removed with a corkscrew.  The acetabulum was exposed and some labral tissues were excised. Reaming was taken to the inside wall of the pelvis and sequentially up to 1 mm smaller than the actual component.  A trial of components was done and then the aforementioned acetabular shell was placed in appropriate tilt and anteversion confirmed by fluoroscopy. The liner was placed along with the hole eliminator and attention was turned to the femur.  The leg was brought down and over into adduction and the elevator bar was used to raise the femur up gently in the wound.  The piriformis was released with care taken to preserve the obturator internus attachment and all of the posterior capsule. The femur was reamed and then broached to the appropriate size.  A trial reduction was done and the aforementioned head and neck assembly gave [place] the best stability in extension with external rotation.  Leg lengths were felt to be about equal by fluoroscopic exam.  The trial components were removed and the wound irrigated.  We then placed the femoral component in appropriate anteversion.  The head was applied to a dry stem neck and the hip again reduced.  It was again stable in the aforementioned position.  The would was irrigated again followed by re-approximation of anterior capsule with ethibond suture. Tensor fascia was repaired  with V-loc suture  followed by deep closure with #O and #2 undyed vicryl.  Skin was closed with subQ stitch and steristrips followed by a sterile dressing.  EBL and IOF can be obtained from anesthesia records.  DISPOSITION:  The patient was extubated in the OR and taken to PACU in stable condition to be admitted to the Orthopedic Surgery for appropriate post-op care to include perioperative antibiotics and DVT prophylaxis.

## 2021-01-28 NOTE — Interval H&P Note (Signed)
History and Physical Interval Note:  01/28/2021 7:33 AM  Cathy [name]  has presented today for surgery, with the diagnosis of LEFT HIP DEGENERATIVE JOINT DISEASE.  The various methods of treatment have been discussed with the patient and family. After consideration of risks, benefits and other options for treatment, the patient has consented to  Procedure(s): LEFT TOTAL HIP ARTHROPLASTY ANTERIOR APPROACH (Left) as a surgical intervention.  The patient's history has been reviewed, patient examined, no change in status, stable for surgery.  I have reviewed the patient's chart and labs.  Questions were answered to the patient's satisfaction.     Hessie Dibble

## 2021-01-28 NOTE — Anesthesia Procedure Notes (Signed)
Procedure Name: Intubation Date/Time: 01/28/2021 7:44 AM Performed by: Claudia Desanctis, CRNA Pre-anesthesia Checklist: Patient identified, Emergency Drugs available, Suction available and Patient being monitored Patient Re-evaluated:Patient Re-evaluated prior to induction Oxygen Delivery Method: Circle system utilized Preoxygenation: Pre-oxygenation with 100% oxygen Induction Type: IV induction Ventilation: Mask ventilation without difficulty Laryngoscope Size: 2 and [name] Grade View: Grade I Tube type: Oral Tube size: 7.0 mm Number of attempts: 1 Airway Equipment and Method: Stylet Placement Confirmation: ETT inserted through vocal cords under direct vision,  positive ETCO2 and breath sounds checked- equal and bilateral Secured at: 21 cm Tube secured with: Tape Dental Injury: Teeth and Oropharynx as per pre-operative assessment

## 2021-01-28 NOTE — Transfer of Care (Signed)
Immediate Anesthesia Transfer of Care Note  Patient: Cathy [name]  Procedure(s) Performed: LEFT TOTAL HIP ARTHROPLASTY ANTERIOR APPROACH (Left Hip)  Patient Location: PACU  Anesthesia Type:General  Level of Consciousness: awake and patient cooperative  Airway & Oxygen Therapy: Patient Spontanous Breathing and Patient connected to face mask  Post-op Assessment: Report given to RN and Post -op Vital signs reviewed and stable  Post vital signs: Reviewed and stable  Last Vitals:  Vitals Value Taken Time  BP 165/92 01/28/21 0905  Temp    Pulse 93 01/28/21 0908  Resp 12 01/28/21 0908  SpO2 98 % 01/28/21 0908  Vitals shown include unvalidated device data.  Last Pain:  Vitals:   01/28/21 0634  TempSrc: Oral  PainSc:       Patients Stated Pain Goal: 7 (03/49/17 9150)  Complications: No complications documented.

## 2021-01-28 NOTE — Anesthesia Postprocedure Evaluation (Signed)
Anesthesia Post Note  Patient: Cathy [name]  Procedure(s) Performed: LEFT TOTAL HIP ARTHROPLASTY ANTERIOR APPROACH (Left Hip)     Patient location during evaluation: PACU Anesthesia Type: General Level of consciousness: awake and alert Pain management: pain level controlled Vital Signs Assessment: post-procedure vital signs reviewed and stable Respiratory status: spontaneous breathing, nonlabored ventilation, respiratory function stable and patient connected to nasal cannula oxygen Cardiovascular status: blood pressure returned to baseline and stable Postop Assessment: no apparent nausea or vomiting Anesthetic complications: no   No complications documented.  Last Vitals:  Vitals:   01/28/21 0930 01/28/21 0945  BP: (!) 159/92 (!) 157/87  Pulse: 91 92  Resp: 19 17  Temp:    SpO2: 96% 91%    Last Pain:  Vitals:   01/28/21 0930  TempSrc:   PainSc: 3                  [name] S

## 2021-01-28 NOTE — Plan of Care (Signed)
  Problem: Education: Goal: Knowledge of General Education information will improve Description: Including pain rating scale, medication(s)/side effects and non-pharmacologic comfort measures Outcome: Progressing   Problem: Activity: Goal: Risk for activity intolerance will decrease Outcome: Progressing   Problem: Nutrition: Goal: Adequate nutrition will be maintained Outcome: Progressing   Problem: Pain Managment: Goal: General experience of comfort will improve Outcome: Progressing   Problem: Education: Goal: Knowledge of the prescribed therapeutic regimen will improve Outcome: Progressing   Problem: Pain Management: Goal: Pain level will decrease with appropriate interventions Outcome: Progressing   Problem: Skin Integrity: Goal: Will show signs of wound healing Outcome: Progressing

## 2021-01-29 ENCOUNTER — Encounter (HOSPITAL_COMMUNITY): Payer: Self-pay | Admitting: Orthopaedic Surgery

## 2021-01-29 DIAGNOSIS — Z79899 Other long term (current) drug therapy: Secondary | ICD-10-CM | POA: Diagnosis not present

## 2021-01-29 DIAGNOSIS — Z96641 Presence of right artificial hip joint: Secondary | ICD-10-CM | POA: Diagnosis not present

## 2021-01-29 DIAGNOSIS — M1612 Unilateral primary osteoarthritis, left hip: Secondary | ICD-10-CM | POA: Diagnosis not present

## 2021-01-29 DIAGNOSIS — Z7982 Long term (current) use of aspirin: Secondary | ICD-10-CM | POA: Diagnosis not present

## 2021-01-29 DIAGNOSIS — I10 Essential (primary) hypertension: Secondary | ICD-10-CM | POA: Diagnosis not present

## 2021-01-29 DIAGNOSIS — E119 Type 2 diabetes mellitus without complications: Secondary | ICD-10-CM | POA: Diagnosis not present

## 2021-01-29 MED ORDER — HYDROCODONE-ACETAMINOPHEN 5-325 MG PO TABS: 1.0000 | ORAL_TABLET | Freq: Four times a day (QID) | ORAL | 0 refills | Status: DC | PRN

## 2021-01-29 MED ORDER — TIZANIDINE HCL 4 MG PO TABS: 4.0000 mg | ORAL_TABLET | Freq: Four times a day (QID) | ORAL | 1 refills | Status: DC | PRN

## 2021-01-29 NOTE — Progress Notes (Signed)
Subjective: 1 Day Post-Op Procedure(s) (LRB): LEFT TOTAL HIP ARTHROPLASTY ANTERIOR APPROACH (Left)   Patient feels great this morning and is hoping to go home.  Activity level:  wbat Diet tolerance:  ok Voiding:  ok Patient reports pain as mild.    Objective: Vital signs in last 24 hours: Temp:  [97.7 F (36.5 C)-98.6 F (37 C)] 97.8 F (36.6 C) (03/09 0504) Pulse Rate:  [66-96] 66 (03/09 0504) Resp:  [13-19] 16 (03/09 0504) BP: (104-165)/(56-92) 133/60 (03/09 0504) SpO2:  [93 %-100 %] 97 % (03/09 0504)  Labs: No results for input(s): HGB in the last 72 hours. No results for input(s): WBC, RBC, HCT, PLT in the last 72 hours. No results for input(s): NA, K, CL, CO2, BUN, CREATININE, GLUCOSE, CALCIUM in the last 72 hours. No results for input(s): LABPT, INR in the last 72 hours.  Physical Exam:  Neurologically intact ABD soft Neurovascular intact Sensation intact distally Intact pulses distally Dorsiflexion/Plantar flexion intact Incision: dressing C/D/I and no drainage No cellulitis present Compartment soft  Assessment/Plan:  1 Day Post-Op Procedure(s) (LRB): LEFT TOTAL HIP ARTHROPLASTY ANTERIOR APPROACH (Left) Advance diet Up with therapy D/C IV fluids Discharge home with home health today after PT if cleared and doing well.  Follow up in office 2 weeks post op. Continue on 81mg  asa BID x 2 weeks post op for DVT prevention.  Larwance Sachs Nida 01/29/2021, 7:55 AM

## 2021-01-29 NOTE — TOC Transition Note (Signed)
Transition of Care [facility]) - CM/SW Discharge Note   Patient Details  Name: Cathy [name] MRN: [id number] Date of Birth: 08-18-1946  Transition of Care [facility]) CM/SW Contact:  Lia Hopping, [place] Phone Number: 01/29/2021, 10:03 AM   Clinical Narrative:    [place] [facility] Plan of care, See Ortho CM note. No needs identified.   Final next level of care: OP Rehab Barriers to Discharge: Barriers Resolved   Patient Goals and CMS Choice        Discharge Placement                       Discharge Plan and Services                                     Social Determinants of Health (SDOH) Interventions     Readmission Risk Interventions No flowsheet data found.

## 2021-01-29 NOTE — Progress Notes (Signed)
Physical Therapy Treatment Patient Details Name: Cathy [name] MRN: [id number] DOB: Oct 05, 1946 Today's Date: 01/29/2021    History of Present Illness Patient is 75 y.o. female s/p Lt THA anterior approach with PMH significant for HTN, HLD, GERD.    PT Comments    POD # 1 am session Pt OOB in recliner eager to go home and very motivated.  Assisted with amb a functional distance in hallway.  Then returned to room to perform some TE's following HEP handout.  Instructed on proper tech, freq as well as use of ICE.   Addressed all mobility questions, discussed appropriate activity, educated on use of ICE.  Pt ready for D/C to home.   Follow Up Recommendations  Follow surgeon's recommendation for DC plan and follow-up therapies;Home health PT     Equipment Recommendations  None recommended by PT    Recommendations for Other Services       Precautions / Restrictions Precautions Precautions: Fall    Mobility  Bed Mobility               General bed mobility comments: OOB in recliner    Transfers Overall transfer level: Needs assistance Equipment used: Rolling walker (2 wheeled) Transfers: Sit to/from [facility] Sit to Stand: Supervision Stand pivot transfers: Supervision       General transfer comment: one VC on safety with turns to use walker and avoid reaching beyond her BOS  Ambulation/Gait Ambulation/Gait assistance: Supervision Gait Distance (Feet): 95 Feet Assistive device: Rolling walker (2 wheeled) Gait Pattern/deviations: Step-to pattern;Decreased stride length;Decreased weight shift to left Gait velocity: WFL   General Gait Details: tolerated a functional distance with only one VC safety with turns proper walker to self distance.   Stairs             Wheelchair Mobility    Modified Rankin (Stroke Patients Only)       Balance                                            Cognition Arousal/Alertness:  Awake/alert Behavior During Therapy: WFL for tasks assessed/performed Overall Cognitive Status: Within Functional Limits for tasks assessed                                 General Comments: AxO x 3 very motivated and eager to D/C to home      Exercises   Total Hip Replacement TE's following HEP Handout 10 reps ankle pumps 05 reps knee presses 05 reps heel slides 05 reps SAQ's 05 reps LAQ's 05 reps ABd 05 reps all standing TE's Instructed how to use a belt loop to assist  Followed by ICE     General Comments        Pertinent Vitals/Pain Pain Assessment: 0-10 Pain Score: 5  Pain Location: Rt hip Pain Descriptors / Indicators: Aching;Grimacing;Operative site guarding Pain Intervention(s): Monitored during session;Premedicated before session;Ice applied    Home Living                      Prior Function            PT Goals (current goals can now be found in the care plan section) Progress towards PT goals: Progressing toward goals    Frequency    7X/week  PT Plan Current plan remains appropriate    Co-evaluation              AM-PAC PT "6 Clicks" Mobility   Outcome Measure  Help needed turning from your back to your side while in a flat bed without using bedrails?: None Help needed moving from lying on your back to sitting on the side of a flat bed without using bedrails?: None Help needed moving to and from a bed to a chair (including a wheelchair)?: None Help needed standing up from a chair using your arms (e.g., wheelchair or bedside chair)?: None Help needed to walk in hospital room?: None Help needed climbing 3-5 steps with a railing? : A Little 6 Click Score: 23    End of Session Equipment Utilized During Treatment: Gait belt Activity Tolerance: Patient tolerated treatment well Patient left: in chair;with call bell/phone within reach;with chair alarm set Nurse Communication: Mobility status PT Visit Diagnosis:  Unsteadiness on feet (R26.81);Muscle weakness (generalized) (M62.81);Pain Pain - Right/Left: Left Pain - part of body: Hip     Time: 9326-7124 PT Time Calculation (min) (ACUTE ONLY): 28 min  Charges:  $Gait Training: 8-22 mins $Therapeutic Exercise: 8-22 mins                     {Natalin Bible  PTA Acute  Rehabilitation Services Pager      [phone] Office      [phone]

## 2021-01-29 NOTE — Discharge Summary (Signed)
Patient ID: Cathy [name] MRN: [id number] DOB/AGE: [birth year] 74 y.o.  Admit date: 01/28/2021 Discharge date: 01/29/2021  Admission Diagnoses:  Principal Problem:   Primary localized osteoarthritis of left hip Active Problems:   Primary osteoarthritis of left hip   Discharge Diagnoses:  Same  Past Medical History:  Diagnosis Date  . Chronic constipation   . DJD (degenerative joint disease)    knees  . Female cystocele   . GERD (gastroesophageal reflux disease)   . History of esophagitis   . History of left inguinal hernia   . History of MRSA infection    recurrent carbuncle  . History of recurrent UTIs   . Hyperlipidemia   . Hypertension   . Pre-diabetes    diet controlled  . Urgency of urination     Surgeries: Procedure(s): LEFT TOTAL HIP ARTHROPLASTY ANTERIOR APPROACH on 01/28/2021   Consultants:   Discharged Condition: Improved  Hospital Course: Cathy [name] is an 75 y.o. female who was admitted 01/28/2021 for operative treatment ofPrimary localized osteoarthritis of left hip. Patient has severe unremitting pain that affects sleep, daily activities, and work/hobbies. After pre-op clearance the patient was taken to the operating room on 01/28/2021 and underwent  Procedure(s): LEFT TOTAL HIP ARTHROPLASTY ANTERIOR APPROACH.    Patient was given perioperative antibiotics:  Anti-infectives (From admission, onward)   Start     Dose/Rate Route Frequency Ordered Stop   01/28/21 1800  vancomycin (VANCOREADY) IVPB 1000 mg/200 mL        1,000 mg 200 mL/hr over 60 Minutes Intravenous Every 12 hours 01/28/21 1019 01/28/21 1945   01/28/21 0600  vancomycin (VANCOCIN) IVPB 1000 mg/200 mL premix        1,000 mg 200 mL/hr over 60 Minutes Intravenous On call to O.R. 01/28/21 0531 01/28/21 0732       Patient was given sequential compression devices, early ambulation, and chemoprophylaxis to prevent DVT.  Patient benefited maximally from hospital stay and there were no  complications.    Recent vital signs:  Patient Vitals for the past 24 hrs:  BP Temp Temp src Pulse Resp SpO2  01/29/21 0504 133/60 97.8 F (36.6 C) Oral 66 16 97 %  01/29/21 0159 116/69 98.6 F (37 C) Oral 68 16 95 %  01/28/21 2148 (!) 104/56 98.2 F (36.8 C) Oral 72 16 97 %  01/28/21 1312 131/76 - - 79 16 100 %  01/28/21 1219 (!) 146/81 97.7 F (36.5 C) Oral 79 18 100 %  01/28/21 1100 (!) 150/88 - - 89 18 100 %  01/28/21 1001 (!) 157/89 97.7 F (36.5 C) Oral 87 16 100 %  01/28/21 0945 (!) 157/87 97.8 F (36.6 C) - 92 17 93 %  01/28/21 0930 (!) 159/92 - - 91 19 96 %  01/28/21 0915 (!) 154/89 - - 92 14 95 %  01/28/21 0905 (!) 165/92 97.7 F (36.5 C) - 96 13 100 %     Recent laboratory studies: No results for input(s): WBC, HGB, HCT, PLT, NA, K, CL, CO2, BUN, CREATININE, GLUCOSE, INR, CALCIUM in the last 72 hours.  Invalid input(s): PT, 2   Discharge Medications:   Allergies as of 01/29/2021      Reactions   Penicillins Hives   Has patient had a PCN reaction causing immediate rash, facial/tongue/throat swelling, SOB or lightheadedness with hypotension: Yes Has patient had a PCN reaction causing severe rash involving mucus membranes or skin necrosis: No Has patient had a PCN reaction that required hospitalization: No  Has patient had a PCN reaction occurring within the last 10 years: No If all of the above answers are "NO", then may proceed with Cephalosporin use.      Medication List    STOP taking these medications   meloxicam 15 MG tablet Commonly known as: MOBIC     TAKE these medications   aspirin 81 MG tablet Take 1 tablet (81 mg total) by mouth 2 (two) times daily after a meal. What changed: when to take this   b complex vitamins capsule Take 1 capsule by mouth daily.   Biotin 1000 MCG tablet Take 1,000 mcg by mouth daily.   blood glucose meter kit and supplies Kit Dispense based on patient and insurance preference. Use once daily to test blood glucose.  (FOR ICD-E11.9).   Calcium Carbonate-Vitamin D 600-400 MG-UNIT tablet Take 1 tablet by mouth daily.   Chromium 1000 MCG Tabs Take 1,000 mcg by mouth daily.   Cinnamon 500 MG capsule Take 1,000 mg by mouth daily.   CRANBERRY PO Take 2 tablets by mouth daily. 4200 mg   cyclobenzaprine 10 MG tablet Commonly known as: FLEXERIL Take 1 tablet (10 mg total) by mouth at bedtime.   ELDERBERRY PO Take 1 capsule by mouth daily.   estradiol 0.1 MG/GM vaginal cream Commonly known as: ESTRACE Place 1 Applicatorful vaginally every other day. In the evening   fexofenadine 180 MG tablet Commonly known as: ALLEGRA Take 180 mg by mouth daily.   fluticasone 50 MCG/ACT nasal spray Commonly known as: FLONASE SHAKE LIQUID AND USE 2 SPRAYS IN EACH NOSTRIL DAILY   gabapentin 100 MG capsule Commonly known as: NEURONTIN TAKE 2 CAPSULES(200 MG) BY MOUTH AT BEDTIME   HYDROcodone-acetaminophen 5-325 MG tablet Commonly known as: NORCO/VICODIN Take 1-2 tablets by mouth every 6 (six) hours as needed for moderate pain or severe pain (post op pain).   lisinopril 10 MG tablet Commonly known as: ZESTRIL TAKE 1 TABLET(10 MG) BY MOUTH DAILY What changed:   how much to take  how to take this  when to take this  additional instructions   multivitamin with minerals tablet Take 1 tablet by mouth daily. Centrum silver   Omega-3 1000 MG Caps Take 1,000 mg by mouth daily.   OneTouch Delica Plus [id number] Misc USE ONCE DAILY   OneTouch Verio test strip Generic drug: glucose blood USE TO TEST BLOOD GLUCOSE TWICE DAILY   OVER THE COUNTER MEDICATION Take 1 tablet by mouth daily as needed (constipation). Vital Lax   polyethylene glycol 17 g packet Commonly known as: MIRALAX / GLYCOLAX Take 17 g by mouth daily as needed for mild constipation.   rosuvastatin 40 MG tablet Commonly known as: CRESTOR TAKE 1 TABLET(40 MG) BY MOUTH DAILY   Tart Cherry Advanced Caps Take 2 capsules by mouth  daily.   tiZANidine 4 MG tablet Commonly known as: Zanaflex Take 1 tablet (4 mg total) by mouth every 6 (six) hours as needed.   traZODone 50 MG tablet Commonly known as: DESYREL TAKE 1/2 TO 1 TABLET(25 TO 50 MG) BY MOUTH AT BEDTIME AS NEEDED FOR SLEEP   trimethoprim 100 MG tablet Commonly known as: TRIMPEX Take 100 mg by mouth as needed.   Turmeric 500 MG Caps Take 1,000 mg by mouth daily.   Vitamin B-12 2500 MCG Subl Place 2,500 mcg under the tongue daily.   vitamin C 1000 MG tablet Take 1,000 mg by mouth daily.  Durable Medical Equipment  (From admission, onward)         Start     Ordered   01/28/21 1020  DME Walker rolling  Once       Question:  Patient needs a walker to treat with the following condition  Answer:  Primary osteoarthritis of left hip   01/28/21 1019   01/28/21 1020  DME 3 n 1  Once        01/28/21 1019   01/28/21 1020  DME Bedside commode  Once       Question:  Patient needs a bedside commode to treat with the following condition  Answer:  Primary osteoarthritis of left hip   01/28/21 1019          Diagnostic Studies: DG C-Arm 1-60 Min-No Report  Result Date: 01/28/2021 Fluoroscopy was utilized by the requesting physician.  No radiographic interpretation.   DG HIP OPERATIVE UNILAT W OR W/O PELVIS LEFT  Result Date: 01/28/2021 CLINICAL DATA:  Left anterior hip replacement. EXAM: OPERATIVE LEFT HIP (WITH PELVIS IF PERFORMED) 2 VIEWS TECHNIQUE: Fluoroscopic spot image(s) were submitted for interpretation post-operatively. COMPARISON:  Apr 09, 2020. FINDINGS: Two C-arm fluoroscopic images were obtained intraoperatively and submitted for post operative interpretation. These images demonstrate left total hip arthroplasty. Partially imaged prior right total hip arthroplasty. Please see the performing provider's procedural report for further detail. IMPRESSION: Left total hip arthroplasty. Electronically Signed   By: Margaretha Sheffield MD   On:  01/28/2021 08:59    Disposition: Discharge disposition: 01-Home or Self Care       Discharge Instructions    Call MD / Call 911   Complete by: As directed    If you experience chest pain or shortness of breath, CALL 911 and be transported to the hospital emergency room.  If you develope a fever above 101 F, pus (white drainage) or increased drainage or redness at the wound, or calf pain, call your surgeon's office.   Constipation Prevention   Complete by: As directed    Drink plenty of fluids.  Prune juice may be helpful.  You may use a stool softener, such as Colace (over the counter) 100 mg twice a day.  Use MiraLax (over the counter) for constipation as needed.   Diet - low sodium heart healthy   Complete by: As directed    Discharge instructions   Complete by: As directed    INSTRUCTIONS AFTER JOINT REPLACEMENT   Remove items at home which could result in a fall. This includes throw rugs or furniture in walking pathways ICE to the affected joint every three hours while awake for 30 minutes at a time, for at least the first 3-5 days, and then as needed for pain and swelling.  Continue to use ice for pain and swelling. You may notice swelling that will progress down to the foot and ankle.  This is normal after surgery.  Elevate your leg when you are not up walking on it.   Continue to use the breathing machine you got in the hospital (incentive spirometer) which will help keep your temperature down.  It is common for your temperature to cycle up and down following surgery, especially at night when you are not up moving around and exerting yourself.  The breathing machine keeps your lungs expanded and your temperature down.   DIET:  As you were doing prior to hospitalization, we recommend a well-balanced diet.  DRESSING / WOUND CARE / SHOWERING  You may  shower 3 days after surgery, but keep the wounds dry during showering.  You may use an occlusive plastic wrap (Press'n Seal for  example), NO SOAKING/SUBMERGING IN THE BATHTUB.  If the bandage gets wet, change with a clean dry gauze.  If the incision gets wet, pat the wound dry with a clean towel.  ACTIVITY  Increase activity slowly as tolerated, but follow the weight bearing instructions below.   No driving for 6 weeks or until further direction given by your physician.  You cannot drive while taking narcotics.  No lifting or carrying greater than 10 lbs. until further directed by your surgeon. Avoid periods of inactivity such as sitting longer than an hour when not asleep. This helps prevent blood clots.  You may return to work once you are authorized by your doctor.     WEIGHT BEARING   Weight bearing as tolerated with assist device (walker, cane, etc) as directed, use it as long as suggested by your surgeon or therapist, typically at least 4-6 weeks.   EXERCISES  Results after joint replacement surgery are often greatly improved when you follow the exercise, range of motion and muscle strengthening exercises prescribed by your doctor. Safety measures are also important to protect the joint from further injury. Any time any of these exercises cause you to have increased pain or swelling, decrease what you are doing until you are comfortable again and then slowly increase them. If you have problems or questions, call your caregiver or physical therapist for advice.   Rehabilitation is important following a joint replacement. After just a few days of immobilization, the muscles of the leg can become weakened and shrink (atrophy).  These exercises are designed to build up the tone and strength of the thigh and leg muscles and to improve motion. Often times heat used for twenty to thirty minutes before working out will loosen up your tissues and help with improving the range of motion but do not use heat for the first two weeks following surgery (sometimes heat can increase post-operative swelling).   These exercises can  be done on a training (exercise) mat, on the floor, on a table or on a bed. Use whatever works the best and is most comfortable for you.    Use music or television while you are exercising so that the exercises are a pleasant break in your day. This will make your life better with the exercises acting as a break in your routine that you can look forward to.   Perform all exercises about fifteen times, three times per day or as directed.  You should exercise both the operative leg and the other leg as well.  Exercises include:   Quad Sets - Tighten up the muscle on the front of the thigh (Quad) and hold for 5-10 seconds.   Straight Leg Raises - With your knee straight (if you were given a brace, keep it on), lift the leg to 60 degrees, hold for 3 seconds, and slowly lower the leg.  Perform this exercise against resistance later as your leg gets stronger.  Leg Slides: Lying on your back, slowly slide your foot toward your buttocks, bending your knee up off the floor (only go as far as is comfortable). Then slowly slide your foot back down until your leg is flat on the floor again.  Angel Wings: Lying on your back spread your legs to the side as far apart as you can without causing discomfort.  Hamstring Strength:  Lying  on your back, push your heel against the floor with your leg straight by tightening up the muscles of your buttocks.  Repeat, but this time bend your knee to a comfortable angle, and push your heel against the floor.  You may put a pillow under the heel to make it more comfortable if necessary.   A rehabilitation program following joint replacement surgery can speed recovery and prevent re-injury in the future due to weakened muscles. Contact your doctor or a physical therapist for more information on knee rehabilitation.    CONSTIPATION  Constipation is defined medically as fewer than three stools per week and severe constipation as less than one stool per week.  Even if you have a  regular bowel pattern at home, your normal regimen is likely to be disrupted due to multiple reasons following surgery.  Combination of anesthesia, postoperative narcotics, change in appetite and fluid intake all can affect your bowels.   YOU MUST use at least one of the following options; they are listed in order of increasing strength to get the job done.  They are all available over the counter, and you may need to use some, POSSIBLY even all of these options:    Drink plenty of fluids (prune juice may be helpful) and high fiber foods Colace 100 mg by mouth twice a day  Senokot for constipation as directed and as needed Dulcolax (bisacodyl), take with full glass of water  Miralax (polyethylene glycol) once or twice a day as needed.  If you have tried all these things and are unable to have a bowel movement in the first 3-4 days after surgery call either your surgeon or your primary doctor.    If you experience loose stools or diarrhea, hold the medications until you stool forms back up.  If your symptoms do not get better within 1 week or if they get worse, check with your doctor.  If you experience "the worst abdominal pain ever" or develop nausea or vomiting, please contact the office immediately for further recommendations for treatment.   ITCHING:  If you experience itching with your medications, try taking only a single pain pill, or even half a pain pill at a time.  You can also use Benadryl over the counter for itching or also to help with sleep.   TED HOSE STOCKINGS:  Use stockings on both legs until for at least 2 weeks or as directed by physician office. They may be removed at night for sleeping.  MEDICATIONS:  See your medication summary on the "After Visit Summary" that nursing will review with you.  You may have some home medications which will be placed on hold until you complete the course of blood thinner medication.  It is important for you to complete the blood thinner  medication as prescribed.  PRECAUTIONS:  If you experience chest pain or shortness of breath - call 911 immediately for transfer to the hospital emergency department.   If you develop a fever greater that 101 F, purulent drainage from wound, increased redness or drainage from wound, foul odor from the wound/dressing, or calf pain - CONTACT YOUR SURGEON.                                                   FOLLOW-UP APPOINTMENTS:  If you do not already have a post-op appointment,  please call the office for an appointment to be seen by your surgeon.  Guidelines for how soon to be seen are listed in your "After Visit Summary", but are typically between 1-4 weeks after surgery.  OTHER INSTRUCTIONS:   Knee Replacement:  Do not place pillow under knee, focus on keeping the knee straight while resting. CPM instructions: 0-90 degrees, 2 hours in the morning, 2 hours in the afternoon, and 2 hours in the evening. Place foam block, curve side up under heel at all times except when in CPM or when walking.  DO NOT modify, tear, cut, or change the foam block in any way.   DENTAL ANTIBIOTICS:  In most cases prophylactic antibiotics for Dental procdeures after total joint surgery are not necessary.  Exceptions are as follows:  1. History of prior total joint infection  2. Severely immunocompromised (Organ Transplant, cancer chemotherapy, Rheumatoid biologic meds such as [place])  3. Poorly controlled diabetes (A1C &gt; 8.0, blood glucose over 200)  If you have one of these conditions, contact your surgeon for an antibiotic prescription, prior to your dental procedure.   MAKE SURE YOU:  Understand these instructions.  Get help right away if you are not doing well or get worse.    Thank you for letting us be a part of your medical care team.  It is a privilege we respect greatly.  We hope these instructions will help you stay on track for a fast and full recovery!   Increase activity slowly as  tolerated   Complete by: As directed        Follow-up Information    Melrose Nakayama, MD. Go on 02/07/2021.   Specialty: Orthopedic Surgery Why: Your appointment is scheduled for 9:00  Contact information: [place] [id number] [phone]        [facility] Go on 01/30/2021.   Why: You are scheduled for outpatient physical therapy at 10:00. please arrive at 9:40 to complete your paperwork  Contact information: [facility] [place] [address] [phone]                Signed: Larwance Sachs Nida 01/29/2021, 8:00 AM

## 2021-01-30 DIAGNOSIS — R262 Difficulty in walking, not elsewhere classified: Secondary | ICD-10-CM | POA: Diagnosis not present

## 2021-01-30 DIAGNOSIS — Z96642 Presence of left artificial hip joint: Secondary | ICD-10-CM | POA: Diagnosis not present

## 2021-01-31 ENCOUNTER — Other Ambulatory Visit: Payer: Self-pay | Admitting: Adult Health

## 2021-01-31 ENCOUNTER — Telehealth: Payer: Self-pay | Admitting: Adult Health

## 2021-01-31 MED ORDER — METHYLPREDNISOLONE 4 MG PO TBPK: ORAL_TABLET | ORAL | 0 refills | Status: DC

## 2021-01-31 NOTE — Telephone Encounter (Signed)
Please advise 

## 2021-01-31 NOTE — Telephone Encounter (Signed)
The patient wants [name] to call her in something for the tingling in her fingers. She said that [place] told her that he can't call her in anything until she has her surgery. She had he surgery on 03/08 and would like to have something called in for her.  [facility] [phone] - [place], [address] AT [place] Phone:  [phone]  Fax:  [phone]

## 2021-02-04 ENCOUNTER — Ambulatory Visit: Payer: Medicare Other | Admitting: Adult Health

## 2021-02-05 ENCOUNTER — Telehealth (INDEPENDENT_AMBULATORY_CARE_PROVIDER_SITE_OTHER): Payer: Medicare Other | Admitting: Adult Health

## 2021-02-05 ENCOUNTER — Encounter: Payer: Self-pay | Admitting: Adult Health

## 2021-02-05 VITALS — Ht 64.0 in

## 2021-02-05 DIAGNOSIS — M79601 Pain in right arm: Secondary | ICD-10-CM | POA: Diagnosis not present

## 2021-02-05 DIAGNOSIS — M25561 Pain in right knee: Secondary | ICD-10-CM | POA: Diagnosis not present

## 2021-02-05 DIAGNOSIS — Z96642 Presence of left artificial hip joint: Secondary | ICD-10-CM | POA: Diagnosis not present

## 2021-02-05 DIAGNOSIS — R262 Difficulty in walking, not elsewhere classified: Secondary | ICD-10-CM | POA: Diagnosis not present
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
## 2021-02-07 DIAGNOSIS — Z96642 Presence of left artificial hip joint: Secondary | ICD-10-CM | POA: Diagnosis not present

## 2021-02-07 DIAGNOSIS — M1612 Unilateral primary osteoarthritis, left hip: Secondary | ICD-10-CM | POA: Diagnosis not present

## 2021-02-07 DIAGNOSIS — Z96643 Presence of artificial hip joint, bilateral: Secondary | ICD-10-CM | POA: Diagnosis not present

## 2021-02-07 DIAGNOSIS — R262 Difficulty in walking, not elsewhere classified: Secondary | ICD-10-CM | POA: Diagnosis not present

## 2021-02-11 DIAGNOSIS — R262 Difficulty in walking, not elsewhere classified: Secondary | ICD-10-CM | POA: Diagnosis not present

## 2021-02-11 DIAGNOSIS — Z96642 Presence of left artificial hip joint: Secondary | ICD-10-CM | POA: Diagnosis not present

## 2021-02-13 DIAGNOSIS — R262 Difficulty in walking, not elsewhere classified: Secondary | ICD-10-CM | POA: Diagnosis not present

## 2021-02-13 DIAGNOSIS — Z96642 Presence of left artificial hip joint: Secondary | ICD-10-CM | POA: Diagnosis not present

## 2021-02-14 ENCOUNTER — Telehealth: Payer: Self-pay | Admitting: Adult Health

## 2021-02-14 NOTE — Telephone Encounter (Signed)
Needs to schedule visit

## 2021-02-14 NOTE — Telephone Encounter (Signed)
Spoke with the patient. She is aware she will need an appointment for this. An appointment was scheduled.

## 2021-02-14 NOTE — Telephone Encounter (Signed)
Patient states the medication that [name] called in for her hand injury has not been helping very much.  She would like to know if she should schedule another visit or if [name] can called in something else for her.  Please advise.

## 2021-02-18 ENCOUNTER — Other Ambulatory Visit: Payer: Self-pay

## 2021-02-18 DIAGNOSIS — R262 Difficulty in walking, not elsewhere classified: Secondary | ICD-10-CM | POA: Diagnosis not present

## 2021-02-18 DIAGNOSIS — Z96642 Presence of left artificial hip joint: Secondary | ICD-10-CM | POA: Diagnosis not present

## 2021-02-19 ENCOUNTER — Ambulatory Visit (INDEPENDENT_AMBULATORY_CARE_PROVIDER_SITE_OTHER): Payer: Medicare Other

## 2021-02-19 ENCOUNTER — Ambulatory Visit (INDEPENDENT_AMBULATORY_CARE_PROVIDER_SITE_OTHER): Payer: Medicare Other | Admitting: Adult Health

## 2021-02-19 VITALS — BP 124/76 | HR 98 | Temp 98.2°F | Resp 18 | Ht 64.0 in | Wt 142.0 lb

## 2021-02-19 DIAGNOSIS — M79641 Pain in right hand: Secondary | ICD-10-CM | POA: Diagnosis not present

## 2021-02-19 MED ORDER — FLUTICASONE PROPIONATE 50 MCG/ACT NA SUSP: NASAL | 2 refills | Status: DC
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
## 2021-02-20 DIAGNOSIS — Z96642 Presence of left artificial hip joint: Secondary | ICD-10-CM | POA: Diagnosis not present

## 2021-02-20 DIAGNOSIS — R262 Difficulty in walking, not elsewhere classified: Secondary | ICD-10-CM | POA: Diagnosis not present

## 2021-02-21 ENCOUNTER — Telehealth: Payer: Self-pay | Admitting: Adult Health
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
## 2021-02-25 DIAGNOSIS — R262 Difficulty in walking, not elsewhere classified: Secondary | ICD-10-CM | POA: Diagnosis not present

## 2021-02-25 DIAGNOSIS — Z96642 Presence of left artificial hip joint: Secondary | ICD-10-CM | POA: Diagnosis not present

## 2021-02-27 DIAGNOSIS — Z96642 Presence of left artificial hip joint: Secondary | ICD-10-CM | POA: Diagnosis not present

## 2021-02-27 DIAGNOSIS — R262 Difficulty in walking, not elsewhere classified: Secondary | ICD-10-CM | POA: Diagnosis not present
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
## 2021-03-04 DIAGNOSIS — Z96642 Presence of left artificial hip joint: Secondary | ICD-10-CM | POA: Diagnosis not present

## 2021-03-04 DIAGNOSIS — R262 Difficulty in walking, not elsewhere classified: Secondary | ICD-10-CM | POA: Diagnosis not present

## 2021-03-05 ENCOUNTER — Encounter: Payer: Self-pay | Admitting: Adult Health

## 2021-03-05 ENCOUNTER — Ambulatory Visit (INDEPENDENT_AMBULATORY_CARE_PROVIDER_SITE_OTHER): Payer: Medicare Other | Admitting: Adult Health

## 2021-03-05 ENCOUNTER — Other Ambulatory Visit: Payer: Self-pay

## 2021-03-05 VITALS — BP 130/80 | HR 84 | Temp 97.8°F | Wt 139.6 lb

## 2021-03-05 DIAGNOSIS — M7541 Impingement syndrome of right shoulder: Secondary | ICD-10-CM | POA: Diagnosis not present

## 2021-03-05 DIAGNOSIS — R300 Dysuria: Secondary | ICD-10-CM

## 2021-03-05 LAB — POCT URINALYSIS DIPSTICK
Bilirubin, UA: NEGATIVE
Blood, UA: NEGATIVE
Glucose, UA: NEGATIVE
Ketones, UA: NEGATIVE
Leukocytes, UA: NEGATIVE
Protein, UA: NEGATIVE
Spec Grav, UA: 1.015 (ref 1.010–1.025)
Urobilinogen, UA: 0.2 E.U./dL
pH, UA: 6 (ref 5.0–8.0)

## 2021-03-05 MED ORDER — METHYLPREDNISOLONE ACETATE 80 MG/ML IJ SUSP
80.0000 mg | Freq: Once | INTRAMUSCULAR | Status: AC
Start: 2021-03-05 — End: 2021-03-05
  Administered 2021-03-05: 80 mg via INTRA_ARTICULAR

## 2021-03-05 NOTE — Addendum Note (Signed)
Addended by: Elmer Picker on: 03/05/2021 09:10 AM   Modules accepted: Orders
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
## 2021-03-06 DIAGNOSIS — Z96642 Presence of left artificial hip joint: Secondary | ICD-10-CM | POA: Diagnosis not present

## 2021-03-06 DIAGNOSIS — R262 Difficulty in walking, not elsewhere classified: Secondary | ICD-10-CM | POA: Diagnosis not present

## 2021-03-06 LAB — URINE CULTURE
MICRO NUMBER:: 11765711
SPECIMEN QUALITY:: ADEQUATE

## 2021-03-13 DIAGNOSIS — R262 Difficulty in walking, not elsewhere classified: Secondary | ICD-10-CM | POA: Diagnosis not present

## 2021-03-13 DIAGNOSIS — Z96642 Presence of left artificial hip joint: Secondary | ICD-10-CM | POA: Diagnosis not present

## 2021-03-14 ENCOUNTER — Encounter: Payer: Self-pay | Admitting: Adult Health

## 2021-03-14 ENCOUNTER — Telehealth (INDEPENDENT_AMBULATORY_CARE_PROVIDER_SITE_OTHER): Payer: Medicare Other | Admitting: Adult Health

## 2021-03-14 ENCOUNTER — Other Ambulatory Visit: Payer: Self-pay

## 2021-03-14 DIAGNOSIS — M7541 Impingement syndrome of right shoulder: Secondary | ICD-10-CM
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
## 2021-03-17 ENCOUNTER — Other Ambulatory Visit: Payer: Medicare Other

## 2021-03-17 ENCOUNTER — Ambulatory Visit (INDEPENDENT_AMBULATORY_CARE_PROVIDER_SITE_OTHER): Payer: Medicare Other

## 2021-03-17 ENCOUNTER — Other Ambulatory Visit: Payer: Self-pay

## 2021-03-17 DIAGNOSIS — M25511 Pain in right shoulder: Secondary | ICD-10-CM | POA: Diagnosis not present

## 2021-03-17 DIAGNOSIS — M7541 Impingement syndrome of right shoulder: Secondary | ICD-10-CM | POA: Diagnosis not present

## 2021-03-18 ENCOUNTER — Other Ambulatory Visit: Payer: Medicare Other

## 2021-03-24 ENCOUNTER — Other Ambulatory Visit: Payer: Self-pay | Admitting: Adult Health

## 2021-03-24 DIAGNOSIS — Z1231 Encounter for screening mammogram for malignant neoplasm of breast: Secondary | ICD-10-CM

## 2021-03-26 NOTE — Progress Notes (Signed)
[place] [address] [place] Phone: [phone] Subjective:   I Cathy [name] am serving as a Education administrator for Dr. Hulan Saas.  This visit occurred during the SARS-CoV-2 public health emergency.  Safety protocols were in place, including screening questions prior to the visit, additional usage of staff PPE, and extensive cleaning of exam room while observing appropriate contact time as indicated for disinfecting solutions.   I'm seeing this patient by the request  of:  [name], NP  CC: Bilateral knee pain left greater than right  XKG:[id number]   12/27/2020 Patient has done relatively well for quite some time.  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  We will get new x-rays.  Patient is having a hip replacement in the near future and I think patient will do well.  Hopefully this will give her some respite of pain for the knees but likely will need to consider the possibility of replacement therapy as well.  Patient will follow up with me again in 3 months for this chronic problem with exacerbation  Update 03/27/2021 Cathy [name] is a 75 y.o. female coming in with complaint of B knee pain. Patient states her left hip is bothering her. States it could be from her left knee. Would like injection. Patient is limping and states yesterday she was walking better. Does not want knee surgery and states she wants to be ready for a wedding soon. Doing home therapy 3x a week. Lateral left hip pain and states it is a little numb.  Patient feels like the left hip replacement has not gone quite as well as the contralateral side.     Past Medical History:  Diagnosis Date  . Chronic constipation   . DJD (degenerative joint disease)    knees  . Female cystocele   . GERD (gastroesophageal reflux disease)   . History of esophagitis   . History of left inguinal hernia   . History of MRSA infection    recurrent  carbuncle  . History of recurrent UTIs   . Hyperlipidemia   . Hypertension   . Pre-diabetes    diet controlled  . Urgency of urination    Past Surgical History:  Procedure Laterality Date  . APPENDECTOMY  1980's  . BREAST EXCISIONAL BIOPSY Left 1989  . COLONOSCOPY  last one 06-18-2015  . INGUINAL HERNIA REPAIR Left 05-10-2001  . LAPAROSCOPIC LYSIS OF ADHESIONS  01/17/2019  . OOPHORECTOMY Right 01/17/2019  . PERINEOPLASTY  01/17/2019  . SALPINGECTOMY Left 01/17/2019  . 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 TOTAL HIP ARTHROPLASTY Left 01/28/2021   Procedure: LEFT TOTAL HIP ARTHROPLASTY ANTERIOR APPROACH;  Surgeon: Melrose Nakayama, MD;  Location: [facility];  Service: Orthopedics;  Laterality: Left;  [name] VAGINAL HYSTERECTOMY  1980's  . VAGINAL PROLAPSE REPAIR N/A 03/02/2016   Procedure: COLOPLAST ANTERIOR  VAULT REPAIR WITH AXIS DERMIS. SACROSPINUS FIXATION, AUGMENTATION WITH AXIS DERMIS;  Surgeon: Carolan Clines, MD;  Location: [facility];  Service: Urology;  Laterality: N/A;   Social History   Socioeconomic History  . Marital status: Single    Spouse name: Not on file  . Number of children: Not on file  . Years of education: Not on file  . Highest education level: Not on file  Occupational History  . Not  on file  Tobacco Use  . Smoking status: Former Smoker    Years: 1.00    Types: Cigarettes    Quit date: 02/28/1996    Years since quitting: 25.0  . Smokeless tobacco: Never Used  Vaping Use  . Vaping Use: Never used  Substance and Sexual Activity  . Alcohol use: Yes    Alcohol/week: 4.0 standard drinks    Types: 4 Standard drinks or equivalent per week    Comment: on weekends  . Drug use: No  . Sexual activity: Not on file  Other Topics Concern  . Not on file  Social History Narrative   Single    Former Smoker  -  quit 10 to 11 years ago (light smoker)   Alcohol use-yes     2 children    Occupation: [employer]    Social Determinants of Health   Financial Resource Strain: Low Risk   . Difficulty of Paying Living Expenses: Not hard at all  Food Insecurity: No Food Insecurity  . Worried About Charity fundraiser in the Last Year: Never true  . Ran Out of Food in the Last Year: Never true  Transportation Needs: No Transportation Needs  . Lack of Transportation (Medical): No  . Lack of Transportation (Non-Medical): No  Physical Activity: Inactive  . Days of Exercise per Week: 0 days  . Minutes of Exercise per Session: 0 min  Stress: No Stress Concern Present  . Feeling of Stress : Not at all  Social Connections: Moderately Isolated  . Frequency of Communication with Friends and Family: More than three times a week  . Frequency of Social Gatherings with Friends and Family: More than three times a week  . Attends Religious Services: More than 4 times per year  . Active Member of Clubs or Organizations: No  . Attends Archivist Meetings: Never  . Marital Status: Never married   Allergies  Allergen Reactions  . Penicillins Hives    Has patient had a PCN reaction causing immediate rash, facial/tongue/throat swelling, SOB or lightheadedness with hypotension: Yes Has patient had a PCN reaction causing severe rash involving mucus membranes or skin necrosis: No Has patient had a PCN reaction that required hospitalization: No Has patient had a PCN reaction occurring within the last 10 years: No If all of the above answers are "NO", then may proceed with Cephalosporin use.    Family History  Problem Relation Age of Onset  . Aneurysm Mother 67       deceased secondary to brain aneurysm  . Liver cancer Father 68       deceased  . Diabetes Other        grandmother  . Colon cancer Neg Hx   . Stomach cancer Neg Hx   . Rectal cancer Neg Hx   . Esophageal  cancer Neg Hx   . Breast cancer Neg Hx      Current Outpatient Medications (Cardiovascular):  .  lisinopril (ZESTRIL) 10 MG tablet, TAKE 1 TABLET(10 MG) BY MOUTH DAILY (Patient taking differently: Take 10 mg by mouth daily.) .  rosuvastatin (CRESTOR) 40 MG tablet, TAKE 1 TABLET(40 MG) BY MOUTH DAILY  Current Outpatient Medications (Respiratory):  .  fexofenadine (ALLEGRA) 180 MG tablet, Take 180 mg by mouth daily. .  fluticasone (FLONASE) 50 MCG/ACT nasal spray, SHAKE LIQUID AND USE 2 SPRAYS IN EACH NOSTRIL DAILY  Current Outpatient Medications (Analgesics):  .  aspirin 81 MG tablet, Take 1 tablet (  81 mg total) by mouth 2 (two) times daily after a meal. (Patient taking differently: Take 81 mg by mouth daily.) .  HYDROcodone-acetaminophen (NORCO/VICODIN) 5-325 MG tablet, Take 1-2 tablets by mouth every 6 (six) hours as needed for moderate pain or severe pain (post op pain).  Current Outpatient Medications (Hematological):  [name]  Cyanocobalamin (VITAMIN B-12) 2500 MCG SUBL, Place 2,500 mcg under the tongue daily.  Current Outpatient Medications (Other):  [name]  Ascorbic Acid (VITAMIN C) 1000 MG tablet, Take 1,000 mg by mouth daily. [name]  b complex vitamins capsule, Take 1 capsule by mouth daily. .  Biotin 1000 MCG tablet, Take 1,000 mcg by mouth daily. .  blood glucose meter kit and supplies KIT, Dispense based on patient and insurance preference. Use once daily to test blood glucose. (FOR ICD-E11.9). .  Calcium Carbonate-Vitamin D 600-400 MG-UNIT tablet, Take 1 tablet by mouth daily. .  Chromium 1000 MCG TABS, Take 1,000 mcg by mouth daily. .  Cinnamon 500 MG capsule, Take 1,000 mg by mouth daily. [name]  CRANBERRY PO, Take 2 tablets by mouth daily. 4200 mg .  cyclobenzaprine (FLEXERIL) 10 MG tablet, Take 1 tablet (10 mg total) by mouth at bedtime. [name]  ELDERBERRY PO, Take 1 capsule by mouth daily. [name]  estradiol (ESTRACE) 0.1 MG/GM vaginal cream, Place 1 Applicatorful vaginally every other day. In the  evening .  gabapentin (NEURONTIN) 100 MG capsule, TAKE 2 CAPSULES(200 MG) BY MOUTH AT BEDTIME .  Lancets (ONETOUCH DELICA PLUS [id number]) MISC, USE ONCE DAILY .  Misc Natural Products (TART CHERRY ADVANCED) CAPS, Take 2 capsules by mouth daily. .  Multiple Vitamins-Minerals (MULTIVITAMIN WITH MINERALS) tablet, Take 1 tablet by mouth daily. Centrum silver .  Omega-3 1000 MG CAPS, Take 1,000 mg by mouth daily. Glory Rosebush VERIO test strip, USE TO TEST BLOOD GLUCOSE TWICE DAILY .  OVER THE COUNTER MEDICATION, Take 1 tablet by mouth daily as needed (constipation). Vital Lax .  polyethylene glycol (MIRALAX / GLYCOLAX) packet, Take 17 g by mouth daily as needed for mild constipation. [name]  tiZANidine (ZANAFLEX) 4 MG tablet, Take 1 tablet (4 mg total) by mouth every 6 (six) hours as needed. .  traZODone (DESYREL) 50 MG tablet, TAKE 1/2 TO 1 TABLET(25 TO 50 MG) BY MOUTH AT BEDTIME AS NEEDED FOR SLEEP .  trimethoprim (TRIMPEX) 100 MG tablet, Take 100 mg by mouth as needed. .  Turmeric 500 MG CAPS, Take 1,000 mg by mouth daily.    Reviewed prior external information including notes and imaging from  primary care provider As well as notes that were available from care everywhere and other healthcare systems.  Past medical history, social, surgical and family history all reviewed in electronic medical record.  No pertanent information unless stated regarding to the chief complaint.   Review of Systems:  No headache, visual changes, nausea, vomiting, diarrhea, constipation, dizziness, abdominal pain, skin rash, fevers, chills, night sweats, weight loss, swollen lymph nodes,  chest pain, shortness of breath, mood changes. POSITIVE muscle aches, body aches, joint swelling  Objective  Blood pressure (!) 158/82, pulse 73, height _0  (1.626 m), weight 141 lb (64 kg), SpO2 100 %.   General: No apparent distress alert and oriented x3 mood and affect normal, dressed appropriately.  HEENT: Pupils equal,  extraocular movements intact  Respiratory: Patient's speak in full sentences and does not appear short of breath  Cardiovascular: Trace lower extremity edema, non tender, no erythema  Gait antalgic gait MSK: Patient's left knee  does have instability with valgus and varus force.  Patient does have a varus deformity of the knee noted.  Tenderness to palpation over the medial joint line.  Lacks last 5 degrees of extension.  After informed  verbal consent, patient was seated on exam table. Left knee was prepped with alcohol swab and utilizing anterolateral approach, patient's left knee space was injected with 4:1  marcaine 0.5%: Kenalog 77m/dL. Patient tolerated the procedure well without immediate complications.     Impression and Recommendations:     The above documentation has been reviewed and is accurate and complete ZLyndal Pulley DO

## 2021-03-27 ENCOUNTER — Other Ambulatory Visit: Payer: Self-pay

## 2021-03-27 ENCOUNTER — Encounter: Payer: Self-pay | Admitting: Family Medicine

## 2021-03-27 ENCOUNTER — Ambulatory Visit (INDEPENDENT_AMBULATORY_CARE_PROVIDER_SITE_OTHER): Payer: Medicare Other | Admitting: Family Medicine

## 2021-03-27 DIAGNOSIS — M170 Bilateral primary osteoarthritis of knee: Secondary | ICD-10-CM | POA: Diagnosis not present

## 2021-03-27 NOTE — Patient Instructions (Addendum)
Good to see you Injection today Wear the brace with activity See me again in 6-8 weeks in case hip does not calm down we can do an injection otherwise see me when you need me

## 2021-03-27 NOTE — Assessment & Plan Note (Signed)
Left knee injected today.  Tolerated the procedure well.  Patient is likely walking differently with her recently having the hip replacement.  Is having more discomfort at the moment still.  Patient encouraged to continue with the home exercises.  Icing regimen.  Patient of course wants to avoid any type of surgical intervention.  Follow-up with me again in 3 months chronic problem with exacerbation.

## 2021-04-02 DIAGNOSIS — Z9889 Other specified postprocedural states: Secondary | ICD-10-CM | POA: Diagnosis not present

## 2021-04-02 DIAGNOSIS — Z96642 Presence of left artificial hip joint: Secondary | ICD-10-CM | POA: Diagnosis not present

## 2021-04-04 ENCOUNTER — Telehealth: Payer: Self-pay | Admitting: Family Medicine

## 2021-04-04 NOTE — Telephone Encounter (Signed)
Patient called asking if Dr Tamala Julian would be willing to complete a handicap placard form for her?  Please advise.

## 2021-04-07 DIAGNOSIS — Z20822 Contact with and (suspected) exposure to covid-19: Secondary | ICD-10-CM | POA: Diagnosis not present

## 2021-04-07 NOTE — Telephone Encounter (Signed)
Spoke with patient and left placard form at front desk for her.

## 2021-04-22 ENCOUNTER — Other Ambulatory Visit: Payer: Self-pay

## 2021-04-22 ENCOUNTER — Telehealth: Payer: Self-pay | Admitting: Adult Health

## 2021-04-22 MED ORDER — LISINOPRIL 10 MG PO TABS: ORAL_TABLET | ORAL | 3 refills | Status: DC

## 2021-04-22 NOTE — Telephone Encounter (Signed)
Spoke to pt and advised that this Rx was sent in for a year. Pt stated that the pharmacist stated that the pt only had 1 refill left. I advised pt that  I will contact the pharmacy and call her back.

## 2021-04-22 NOTE — Telephone Encounter (Signed)
Could not get through to the pharmacy so I just refilled Rx. Pt notified of update.

## 2021-04-22 NOTE — Telephone Encounter (Signed)
lisinopril (ZESTRIL) 10 MG tablet    [facility] [phone] - [place], [address] AT [place] Phone:  [phone]  Fax:  [phone]

## 2021-04-24 ENCOUNTER — Telehealth: Payer: Self-pay | Admitting: Family Medicine

## 2021-04-24 NOTE — Telephone Encounter (Signed)
L knee is bothering her, has been using ice occasionally but nothing else. Do we have any recommendation to help with the inflammation?

## 2021-04-25 NOTE — Telephone Encounter (Signed)
Left message for patient to call back for recommendations.  

## 2021-04-25 NOTE — Telephone Encounter (Signed)
Could try compressoin sleeve Arnica lotion or voltaren gel over the counter as well.  If continue to give pain or worsen, need to be seen and if over the weekend may need to be urgent care, otherwise have her call agin Monday to tell us how she is doing

## 2021-05-02 DIAGNOSIS — Z9889 Other specified postprocedural states: Secondary | ICD-10-CM | POA: Diagnosis not present

## 2021-05-02 DIAGNOSIS — Z96643 Presence of artificial hip joint, bilateral: Secondary | ICD-10-CM | POA: Diagnosis not present

## 2021-05-09 NOTE — Progress Notes (Deleted)
[place] Phone: [phone] Subjective:    I'm seeing this patient by the request  of:  [name], NP  CC:   [id number]  03/27/2021 Left knee injected today.  Tolerated the procedure well.  Patient is likely walking differently with her recently having the hip replacement.  Is having more discomfort at the moment still.  Patient encouraged to continue with the home exercises.  Icing regimen.  Patient of course wants to avoid any type of surgical intervention.  Follow-up with me again in 3 months chronic problem with exacerbation.  Update 05/13/2021 Cathy [name] is a 75 y.o. female coming in with complaint of B knee pain.   Onset-  Location Duration-  Character- Aggravating factors- Reliving factors-  Therapies tried-  Severity-     Past Medical History:  Diagnosis Date   Chronic constipation    DJD (degenerative joint disease)    knees   Female cystocele    GERD (gastroesophageal reflux disease)    History of esophagitis    History of left inguinal hernia    History of MRSA infection    recurrent carbuncle   History of recurrent UTIs    Hyperlipidemia    Hypertension    Pre-diabetes    diet controlled   Urgency of urination    Past Surgical History:  Procedure Laterality Date   APPENDECTOMY  1980's   BREAST EXCISIONAL BIOPSY Left 1989   COLONOSCOPY  last one 06-18-2015   INGUINAL HERNIA REPAIR Left 05-10-2001   LAPAROSCOPIC LYSIS OF ADHESIONS  01/17/2019   OOPHORECTOMY Right 01/17/2019   PERINEOPLASTY  01/17/2019   SALPINGECTOMY Left 01/17/2019   TOTAL HIP ARTHROPLASTY Right 04/09/2020   Procedure: RIGH TOTAL HIP ARTHROPLASTY ANTERIOR APPROACH;  Surgeon: Melrose Nakayama, MD;  Location: [facility];  Service: Orthopedics;  Laterality: Right;   TOTAL HIP ARTHROPLASTY Left 01/28/2021   Procedure: LEFT TOTAL HIP ARTHROPLASTY ANTERIOR APPROACH;  Surgeon: Melrose Nakayama, MD;  Location: [facility];  Service: Orthopedics;  Laterality: Left;   VAGINAL HYSTERECTOMY  1980's   VAGINAL PROLAPSE REPAIR N/A 03/02/2016   Procedure: COLOPLAST ANTERIOR  VAULT REPAIR WITH AXIS DERMIS. SACROSPINUS FIXATION, AUGMENTATION WITH AXIS DERMIS;  Surgeon: Carolan Clines, MD;  Location: [facility];  Service: Urology;  Laterality: N/A;   Social History   Socioeconomic History   Marital status: Single    Spouse name: Not on file   Number of children: Not on file   Years of education: Not on file   Highest education level: Not on file  Occupational History   Not on file  Tobacco Use   Smoking status: Former    Years: 1.00    Pack years: 0.00    Types: Cigarettes    Quit date: 02/28/1996    Years since quitting: 25.2   Smokeless tobacco: Never  Vaping Use   Vaping Use: Never used  Substance and Sexual Activity   Alcohol use: Yes    Alcohol/week: 4.0 standard drinks    Types: 4 Standard drinks or equivalent per week    Comment: on weekends   Drug use: No   Sexual activity: Not on file  Other Topics Concern   Not on file  Social History Narrative   Single   Former Smoker  -  quit 10 to 11 years ago (light smoker)   Alcohol use-yes     2 children    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    Social Determinants of Health   Financial Resource Strain: Low Risk    Difficulty of Paying Living Expenses: Not hard at all  Food Insecurity: No Food Insecurity   Worried About Charity fundraiser in the Last Year: Never true   Arboriculturist in the Last Year: Never true  Transportation Needs: No Transportation Needs   Lack of Transportation (Medical): No   Lack of Transportation (Non-Medical): No  Physical Activity: Inactive   Days of Exercise per Week: 0 days   Minutes of Exercise per Session: 0 min  Stress: No Stress Concern Present   Feeling of Stress : Not at all  Social Connections: Moderately Isolated   Frequency of Communication with Friends and Family: More than  three times a week   Frequency of Social Gatherings with Friends and Family: More than three times a week   Attends Religious Services: More than 4 times per year   Active Member of [employer] or Organizations: No   Attends Music therapist: Never   Marital Status: Never married   Allergies  Allergen Reactions   Penicillins Hives    Has patient had a PCN reaction causing immediate rash, facial/tongue/throat swelling, SOB or lightheadedness with hypotension: Yes Has patient had a PCN reaction causing severe rash involving mucus membranes or skin necrosis: No Has patient had a PCN reaction that required hospitalization: No Has patient had a PCN reaction occurring within the last 10 years: No If all of the above answers are "NO", then may proceed with Cephalosporin use.    Family History  Problem Relation Age of Onset   Aneurysm Mother 46       deceased secondary to brain aneurysm   Liver cancer Father 17       deceased   Diabetes Other        grandmother   Colon cancer Neg Hx    Stomach cancer Neg Hx    Rectal cancer Neg Hx    Esophageal cancer Neg Hx    Breast cancer Neg Hx      Current Outpatient Medications (Cardiovascular):    lisinopril (ZESTRIL) 10 MG tablet, TAKE 1 TABLET(10 MG) BY MOUTH DAILY   rosuvastatin (CRESTOR) 40 MG tablet, TAKE 1 TABLET(40 MG) BY MOUTH DAILY  Current Outpatient Medications (Respiratory):    fexofenadine (ALLEGRA) 180 MG tablet, Take 180 mg by mouth daily.   fluticasone (FLONASE) 50 MCG/ACT nasal spray, SHAKE LIQUID AND USE 2 SPRAYS IN EACH NOSTRIL DAILY  Current Outpatient Medications (Analgesics):    aspirin 81 MG tablet, Take 1 tablet (81 mg total) by mouth 2 (two) times daily after a meal. (Patient taking differently: Take 81 mg by mouth daily.)   HYDROcodone-acetaminophen (NORCO/VICODIN) 5-325 MG tablet, Take 1-2 tablets by mouth every 6 (six) hours as needed for moderate pain or severe pain (post op pain).  Current Outpatient  Medications (Hematological):    Cyanocobalamin (VITAMIN B-12) 2500 MCG SUBL, Place 2,500 mcg under the tongue daily.  Current Outpatient Medications (Other):    Ascorbic Acid (VITAMIN C) 1000 MG tablet, Take 1,000 mg by mouth daily.   b complex vitamins capsule, Take 1 capsule by mouth daily.   Biotin 1000 MCG tablet, Take 1,000 mcg by mouth daily.   blood glucose meter kit and supplies KIT, Dispense based on patient and insurance preference. Use once daily to test blood glucose. (FOR ICD-E11.9).   Calcium Carbonate-Vitamin D 600-400 MG-UNIT tablet, Take 1 tablet by mouth daily.  Chromium 1000 MCG TABS, Take 1,000 mcg by mouth daily.   Cinnamon 500 MG capsule, Take 1,000 mg by mouth daily.   CRANBERRY PO, Take 2 tablets by mouth daily. 4200 mg   cyclobenzaprine (FLEXERIL) 10 MG tablet, Take 1 tablet (10 mg total) by mouth at bedtime.   ELDERBERRY PO, Take 1 capsule by mouth daily.   estradiol (ESTRACE) 0.1 MG/GM vaginal cream, Place 1 Applicatorful vaginally every other day. In the evening   gabapentin (NEURONTIN) 100 MG capsule, TAKE 2 CAPSULES(200 MG) BY MOUTH AT BEDTIME   Lancets (ONETOUCH DELICA PLUS [id number]) MISC, USE ONCE DAILY   Misc Natural Products (TART CHERRY ADVANCED) CAPS, Take 2 capsules by mouth daily.   Multiple Vitamins-Minerals (MULTIVITAMIN WITH MINERALS) tablet, Take 1 tablet by mouth daily. Centrum silver   Omega-3 1000 MG CAPS, Take 1,000 mg by mouth daily.   ONETOUCH VERIO test strip, USE TO TEST BLOOD GLUCOSE TWICE DAILY   OVER THE COUNTER MEDICATION, Take 1 tablet by mouth daily as needed (constipation). Vital Lax   polyethylene glycol (MIRALAX / GLYCOLAX) packet, Take 17 g by mouth daily as needed for mild constipation.   tiZANidine (ZANAFLEX) 4 MG tablet, Take 1 tablet (4 mg total) by mouth every 6 (six) hours as needed.   traZODone (DESYREL) 50 MG tablet, TAKE 1/2 TO 1 TABLET(25 TO 50 MG) BY MOUTH AT BEDTIME AS NEEDED FOR SLEEP   trimethoprim (TRIMPEX) 100 MG  tablet, Take 100 mg by mouth as needed.   Turmeric 500 MG CAPS, Take 1,000 mg by mouth daily.    Reviewed prior external information including notes and imaging from  primary care provider As well as notes that were available from care everywhere and other healthcare systems.  Past medical history, social, surgical and family history all reviewed in electronic medical record.  No pertanent information unless stated regarding to the chief complaint.   Review of Systems:  No headache, visual changes, nausea, vomiting, diarrhea, constipation, dizziness, abdominal pain, skin rash, fevers, chills, night sweats, weight loss, swollen lymph nodes, body aches, joint swelling, chest pain, shortness of breath, mood changes. POSITIVE muscle aches  Objective  There were no vitals taken for this visit.   General: No apparent distress alert and oriented x3 mood and affect normal, dressed appropriately.  HEENT: Pupils equal, extraocular movements intact  Respiratory: Patient's speak in full sentences and does not appear short of breath  Cardiovascular: No lower extremity edema, non tender, no erythema  Gait normal with good balance and coordination.  MSK:  Non tender with full range of motion and good stability and symmetric strength and tone of shoulders, elbows, wrist, hip, knee and ankles bilaterally.     Impression and Recommendations:     The above documentation has been reviewed and is accurate and complete Jacqualin Combes

## 2021-05-13 ENCOUNTER — Ambulatory Visit: Payer: Medicare Other | Admitting: Family Medicine

## 2021-05-20 ENCOUNTER — Telehealth: Payer: Self-pay | Admitting: Adult Health

## 2021-05-20 NOTE — Telephone Encounter (Signed)
Pt call and want a refill on  gabapentin (NEURONTIN) 100 MG capsule sent to  [facility] [phone] - [place], [place] - [address] [place] Phone:  [phone]  Fax:  [phone]

## 2021-05-20 NOTE — Telephone Encounter (Signed)
Called pt again no answer. If pt return call please advise to call Dr/Zack Tamala Julian for these refills. [name] has never prescribed. If pt wants Core continue Rx she will have to wait until he is able to answer question since he is out of the office.

## 2021-05-20 NOTE — Telephone Encounter (Signed)
Called pt back on both provided numbers no answer. Lm for pt to return call. This Rx has never been prescribed by [facility] and needs to come from [facility] for refills.

## 2021-05-21 ENCOUNTER — Ambulatory Visit
Admission: RE | Admit: 2021-05-21 | Discharge: 2021-05-21 | Disposition: A | Discharge status: Home / Self Care | Payer: Medicare Other | Source: Ambulatory Visit | Attending: Adult Health | Admitting: Adult Health

## 2021-05-21 ENCOUNTER — Other Ambulatory Visit: Payer: Self-pay

## 2021-05-21 DIAGNOSIS — Z1231 Encounter for screening mammogram for malignant neoplasm of breast: Secondary | ICD-10-CM | POA: Diagnosis not present

## 2021-06-11 ENCOUNTER — Telehealth: Payer: Self-pay

## 2021-06-11 NOTE — Telephone Encounter (Signed)
Patient did not log on for video visit 945am , called patient x2 with no answer.   Patient may reschedule next available appointment .  L.Gavynn Duvall,LPN

## 2021-06-11 NOTE — Progress Notes (Deleted)
Subjective:   Cathy [name] is a 75 y.o. female who presents for Medicare Annual (Subsequent) preventive examination.  I connected with Lianne Bushy today by telephone and verified that I am speaking with the correct person using two identifiers. Location patient: home Location provider: work Persons participating in the virtual visit: patient, provider.   I discussed the limitations, risks, security and privacy concerns of performing an evaluation and management service by telephone and the availability of in person appointments. I also discussed with the patient that there may be a patient responsible charge related to this service. The patient expressed understanding and verbally consented to this telephonic visit.    Interactive audio and video telecommunications were attempted between this provider and patient, however failed, due to patient having technical difficulties OR patient did not have access to video capability.  We continued and completed visit with audio only.    Review of Systems    N/a       Objective:    There were no vitals filed for this visit. There is no height or weight on file to calculate BMI.  Advanced Directives 01/28/2021 01/21/2021 06/10/2020 04/09/2020 04/09/2020 04/02/2020 12/02/2017  Does Patient Have a Medical Advance Directive? No No No No No No No  Type of Advance Directive - - - - - - -  Does patient want to make changes to medical advance directive? - - - - - - -  Copy of [place] in Chart? - - - - - - -  Would patient like information on creating a medical advance directive? No - Patient declined - No - Patient declined No - Patient declined No - Patient declined - -    Current Medications (verified) Outpatient Encounter Medications as of 06/11/2021  Medication Sig   Ascorbic Acid (VITAMIN C) 1000 MG tablet Take 1,000 mg by mouth daily.   aspirin 81 MG tablet Take 1 tablet (81 mg total) by mouth 2 (two) times daily after a  meal. (Patient taking differently: Take 81 mg by mouth daily.)   b complex vitamins capsule Take 1 capsule by mouth daily.   Biotin 1000 MCG tablet Take 1,000 mcg by mouth daily.   blood glucose meter kit and supplies KIT Dispense based on patient and insurance preference. Use once daily to test blood glucose. (FOR ICD-E11.9).   Calcium Carbonate-Vitamin D 600-400 MG-UNIT tablet Take 1 tablet by mouth daily.   Chromium 1000 MCG TABS Take 1,000 mcg by mouth daily.   Cinnamon 500 MG capsule Take 1,000 mg by mouth daily.   CRANBERRY PO Take 2 tablets by mouth daily. 4200 mg   Cyanocobalamin (VITAMIN B-12) 2500 MCG SUBL Place 2,500 mcg under the tongue daily.   cyclobenzaprine (FLEXERIL) 10 MG tablet Take 1 tablet (10 mg total) by mouth at bedtime.   ELDERBERRY PO Take 1 capsule by mouth daily.   estradiol (ESTRACE) 0.1 MG/GM vaginal cream Place 1 Applicatorful vaginally every other day. In the evening   fexofenadine (ALLEGRA) 180 MG tablet Take 180 mg by mouth daily.   fluticasone (FLONASE) 50 MCG/ACT nasal spray SHAKE LIQUID AND USE 2 SPRAYS IN EACH NOSTRIL DAILY   gabapentin (NEURONTIN) 100 MG capsule TAKE 2 CAPSULES(200 MG) BY MOUTH AT BEDTIME   HYDROcodone-acetaminophen (NORCO/VICODIN) 5-325 MG tablet Take 1-2 tablets by mouth every 6 (six) hours as needed for moderate pain or severe pain (post op pain).   Lancets (ONETOUCH DELICA PLUS [id number]) MISC USE ONCE DAILY   lisinopril (  ZESTRIL) 10 MG tablet TAKE 1 TABLET(10 MG) BY MOUTH DAILY   Misc Natural Products (TART CHERRY ADVANCED) CAPS Take 2 capsules by mouth daily.   Multiple Vitamins-Minerals (MULTIVITAMIN WITH MINERALS) tablet Take 1 tablet by mouth daily. Centrum silver   Omega-3 1000 MG CAPS Take 1,000 mg by mouth daily.   ONETOUCH VERIO test strip USE TO TEST BLOOD GLUCOSE TWICE DAILY   OVER THE COUNTER MEDICATION Take 1 tablet by mouth daily as needed (constipation). Vital Lax   polyethylene glycol (MIRALAX / GLYCOLAX) packet Take  17 g by mouth daily as needed for mild constipation.   rosuvastatin (CRESTOR) 40 MG tablet TAKE 1 TABLET(40 MG) BY MOUTH DAILY   tiZANidine (ZANAFLEX) 4 MG tablet Take 1 tablet (4 mg total) by mouth every 6 (six) hours as needed.   traZODone (DESYREL) 50 MG tablet TAKE 1/2 TO 1 TABLET(25 TO 50 MG) BY MOUTH AT BEDTIME AS NEEDED FOR SLEEP   trimethoprim (TRIMPEX) 100 MG tablet Take 100 mg by mouth as needed.   Turmeric 500 MG CAPS Take 1,000 mg by mouth daily.    [DISCONTINUED] valsartan (DIOVAN) 160 MG tablet Take 1/2 tablet by mouth once daily   No facility-administered encounter medications on file as of 06/11/2021.    Allergies (verified) Penicillins   History: Past Medical History:  Diagnosis Date   Chronic constipation    DJD (degenerative joint disease)    knees   Female cystocele    GERD (gastroesophageal reflux disease)    History of esophagitis    History of left inguinal hernia    History of MRSA infection    recurrent carbuncle   History of recurrent UTIs    Hyperlipidemia    Hypertension    Pre-diabetes    diet controlled   Urgency of urination    Past Surgical History:  Procedure Laterality Date   APPENDECTOMY  1980's   BREAST EXCISIONAL BIOPSY Left 1989   COLONOSCOPY  last one 06-18-2015   INGUINAL HERNIA REPAIR Left 05-10-2001   LAPAROSCOPIC LYSIS OF ADHESIONS  01/17/2019   OOPHORECTOMY Right 01/17/2019   PERINEOPLASTY  01/17/2019   SALPINGECTOMY Left 01/17/2019   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 SACROSPINUS FIXATION, AUGMENTATION WITH AXIS DERMIS;  Surgeon: Carolan Clines, MD;  Location: [facility];  Service: Urology;  Laterality: N/A;   Family History  Problem Relation Age of Onset   Aneurysm Mother 9       deceased secondary to brain aneurysm   Liver cancer Father 5       deceased   Diabetes Other        grandmother   Colon cancer Neg Hx    Stomach cancer Neg Hx    Rectal cancer Neg Hx    Esophageal cancer Neg Hx    Breast cancer Neg Hx    Social History   Socioeconomic History   Marital status: Single    Spouse name: Not on file   Number of children: Not on file   Years of education: Not  on file   Highest education level: Not on file  Occupational History   Not on file  Tobacco Use   Smoking status: Former    Years: 1.00    Types: Cigarettes    Quit date: 02/28/1996    Years since quitting: 25.3   Smokeless tobacco: Never  Vaping Use   Vaping Use: Never used  Substance and Sexual Activity   Alcohol use: Yes    Alcohol/week: 4.0 standard drinks    Types: 4 Standard drinks or equivalent per week    Comment: on weekends   Drug use: No   Sexual activity: Not on file  Other Topics Concern   Not on file  Social History Narrative   Single   Former Smoker  -  quit 10 to 11 years ago (light smoker)   Alcohol use-yes     2 children    Occupation: [employer]    Social Determinants of Health   Financial Resource Strain: Low Risk    Difficulty of Paying Living Expenses: Not hard at all  Food Insecurity: No Food Insecurity   Worried About Charity fundraiser in the Last Year: Never true   Arboriculturist in the Last Year: Never true  Transportation Needs: No Transportation Needs   Lack of Transportation (Medical): No   Lack of Transportation (Non-Medical): No  Physical Activity: Inactive   Days of Exercise per Week: 0 days   Minutes of Exercise per Session: 0 min  Stress: No Stress Concern Present    Feeling of Stress : Not at all  Social Connections: Moderately Isolated   Frequency of Communication with Friends and Family: More than three times a week   Frequency of Social Gatherings with Friends and Family: More than three times a week   Attends Religious Services: More than 4 times per year   Active Member of [employer] or Organizations: No   Attends Archivist Meetings: Never   Marital Status: Never married    Tobacco Counseling Counseling given: Not Answered   Clinical Intake:                 Diabetic?yes Nutrition Risk Assessment:  Has the patient had any N/V/D within the last 2 months?  {YES/NO:21197} Does the patient have any non-healing wounds?  {YES/NO:21197} Has the patient had any unintentional weight loss or weight gain?  {YES/NO:21197}  Diabetes:  Is the patient diabetic?  {YES/NO:21197} If diabetic, was a CBG obtained today?  {YES/NO:21197} Did the patient bring in their glucometer from home?  {YES/NO:21197} How often do you monitor your CBG's? ***.   Financial Strains and Diabetes Management:  Are you having any financial strains with the device, your supplies or your medication? {YES/NO:21197}.  Does the patient want to be seen by Chronic Care Management for management of their diabetes?  {YES/NO:21197} Would the patient like to be referred to a Nutritionist or for Diabetic Management?  {YES/NO:21197}  Diabetic Exams:  {Diabetic Eye Exam:2101801} {Diabetic Foot Exam:2101802}          Activities of Daily Living In your present state of health, do you have any difficulty performing the following activities: 01/28/2021 01/21/2021  Hearing? N N  Vision? N N  Difficulty concentrating or making decisions? N N  Walking or climbing stairs? N N  Dressing or bathing? N N  Doing errands, shopping? N N  Some recent data might be hidden    Patient Care Team: [name], NP  as PCP - General (Family Medicine) Lucas Mallow, MD as  Consulting Physician (Urology)  Indicate any recent Medical Services you may have received from other than [facility] providers in the past year (date may be approximate).     Assessment:   This is a routine wellness examination for Betty.  Hearing/Vision screen No results found.  Dietary issues and exercise activities discussed:     Goals Addressed   None    Depression Screen PHQ 2/9 Scores 11/01/2020 07/30/2020 06/10/2020 07/28/2019 07/01/2017 07/01/2016 07/17/2014  PHQ - 2 Score 0 0 0 0 0 0 1  PHQ- 9 Score - - 0 - - - -    Fall Risk Fall Risk  11/01/2020 06/10/2020 07/28/2019 07/01/2017 07/01/2016  Falls in the past year? 0 0 0 No No  Number falls in past yr: - 0 - - -  Injury with Fall? - 0 - - -  Risk for fall due to : - History of fall(s);Medication side effect;Orthopedic patient - - -  Follow up - Falls evaluation completed;Falls prevention discussed - - -    FALL RISK PREVENTION PERTAINING TO THE HOME:  Any stairs in or around the home? {YES/NO:21197} If so, are there any without handrails? {YES/NO:21197} Home free of loose throw rugs in walkways, pet beds, electrical cords, etc? {YES/NO:21197} Adequate lighting in your home to reduce risk of falls? {YES/NO:21197}  ASSISTIVE DEVICES UTILIZED TO PREVENT FALLS:  Life alert? {YES/NO:21197} Use of a cane, walker or w/c? {YES/NO:21197} Grab bars in the bathroom? {YES/NO:21197} Shower chair or bench in shower? {YES/NO:21197} Elevated toilet seat or a handicapped toilet? {YES/NO:21197}  TIMED UP AND GO:  Was the test performed? {YES/NO:21197}.  Length of time to ambulate 10 feet: *** sec.   {Appearance of [id number]  Cognitive Function:     6CIT Screen 06/10/2020  What Year? 0 points  What month? 0 points  What time? 0 points  Count back from 20 0 points  Months in reverse 0 points  Repeat phrase 2 points  Total Score 2    Immunizations Immunization History  Administered Date(s) Administered   Fluad Quad(high Dose  65+) 07/28/2019, 07/30/2020   Influenza Split 09/22/2011, 11/02/2012   Influenza Whole 08/27/2008, 08/29/2010   Influenza, High Dose Seasonal PF 08/26/2017, 08/26/2017, 08/06/2018   Influenza,inj,Quad PF,6+ Mos 10/18/2013, 07/17/2014   Moderna Sars-Covid-2 Vaccination 12/17/2019, 01/14/2020   Pneumococcal Conjugate-13 10/10/2015   Pneumococcal Polysaccharide-23 12/18/2011, 07/01/2017, 08/28/2017, 08/28/2017   Td 12/19/2007   Zoster Recombinat (Shingrix) 08/06/2018, 08/06/2018, 10/06/2018, 10/10/2018, 10/10/2018   Zoster, Live 03/07/2015    {TDAP status:2101805}  {Flu Vaccine status:2101806}  {Pneumococcal vaccine status:2101807}  {Covid-19 vaccine status:2101808}  Qualifies for Shingles Vaccine? {YES/NO:21197}  Zostavax completed {YES/NO:21197}  {Shingrix Completed?:2101804}  Screening Tests Health Maintenance  Topic Date Due   TETANUS/TDAP  12/18/2017   COVID-19 Vaccine (3 - Booster for Moderna series) 06/12/2020   INFLUENZA VACCINE  06/23/2021   OPHTHALMOLOGY EXAM  06/23/2021   HEMOGLOBIN A1C  07/24/2021   FOOT EXAM  07/30/2021   MAMMOGRAM  05/22/2023   COLONOSCOPY (Pts 45-43yr Insurance coverage will need to be confirmed)  06/17/2025   DEXA SCAN  Completed   Hepatitis C Screening  Completed   PNA vac Low Risk Adult  Completed   Zoster Vaccines- Shingrix  Completed   HPV VACCINES  Aged Out    Health Maintenance  Health Maintenance Due  Topic Date Due   TETANUS/TDAP  12/18/2017   COVID-19 Vaccine (3 - Booster for [employer]  series) 06/12/2020    {Colorectal cancer screening:2101809}  {Mammogram status:21018020}  {Bone Density status:21018021}  Lung Cancer Screening: (Low Dose CT Chest recommended if Age 37-80 years, 30 pack-year currently smoking OR have quit w/in 15years.) {DOES NOT does:27190::"does not"} qualify.   Lung Cancer Screening Referral: ***  Additional Screening:  Hepatitis C Screening: {DOES NOT does:27190::"does not"} qualify; Completed  ***  Vision Screening: Recommended annual ophthalmology exams for early detection of glaucoma and other disorders of the eye. Is the patient up to date with their annual eye exam?  {YES/NO:21197} Who is the provider or what is the name of the office in which the patient attends annual eye exams? *** If pt is not established with a provider, would they like to be referred to a provider to establish care? {YES/NO:21197}.   Dental Screening: Recommended annual dental exams for proper oral hygiene  Community Resource Referral / Chronic Care Management: CRR required this visit?  {YES/NO:21197}  CCM required this visit?  {YES/NO:21197}     Plan:     I have personally reviewed and noted the following in the patient's chart:   Medical and social history Use of alcohol, tobacco or illicit drugs  Current medications and supplements including opioid prescriptions.  Functional ability and status Nutritional status Physical activity Advanced directives List of other physicians Hospitalizations, surgeries, and ER visits in previous 12 months Vitals Screenings to include cognitive, depression, and falls Referrals and appointments  In addition, I have reviewed and discussed with patient certain preventive protocols, quality metrics, and best practice recommendations. A written personalized care plan for preventive services as well as general preventive health recommendations were provided to patient.     Randel Pigg, LPN   [id number]   Nurse Notes: ***

## 2021-06-17 NOTE — Progress Notes (Signed)
[place] Phone: [phone] Subjective:   [name], am serving as a scribe for Dr. Hulan Saas.  This visit occurred during the SARS-CoV-2 public health emergency.  Safety protocols were in place, including screening questions prior to the visit, additional usage of staff PPE, and extensive cleaning of exam room while observing appropriate contact time as indicated for disinfecting solutions.   I'm seeing this patient by the request  of:  [name], NP  CC: Bilateral knee pain, bilateral hand pain, left hip pain  OTL:[id number]  03/27/2021 Left knee injected today.  Tolerated the procedure well.  Patient is likely walking differently with her recently having the hip replacement.  Is having more discomfort at the moment still.  Patient encouraged to continue with the home exercises.  Icing regimen.  Patient of course wants to avoid any type of surgical intervention.  Follow-up with me again in 3 months chronic problem with exacerbation.  Update 06/18/2021 Cathy [name] is a 75 y.o. female coming in with complaint of B knee and hip pain. Patient states that she continues to have numbness over lateral aspect of L hip.  Patient states that it is highly irritating.  Both knees are bothering her. Would like injections.  Patient would not like to have a steroid injections and would like the gel injections instead.  Using gloves on hands at night and would like to talk about how to relieve her pain.  Has noted some mild numbness.  Pain that is severe at night.  Sometimes repetitive activity gives her difficulty as well.    Past Medical History:  Diagnosis Date   Chronic constipation    DJD (degenerative joint disease)    knees   Female cystocele    GERD (gastroesophageal reflux disease)    History of esophagitis    History of left inguinal hernia    History of MRSA infection    recurrent carbuncle    History of recurrent UTIs    Hyperlipidemia    Hypertension    Pre-diabetes    diet controlled   Urgency of urination    Past Surgical History:  Procedure Laterality Date   APPENDECTOMY  1980's   BREAST EXCISIONAL BIOPSY Left 1989   COLONOSCOPY  last one 06-18-2015   INGUINAL HERNIA REPAIR Left 05-10-2001   LAPAROSCOPIC LYSIS OF ADHESIONS  01/17/2019   OOPHORECTOMY Right 01/17/2019   PERINEOPLASTY  01/17/2019   SALPINGECTOMY Left 01/17/2019   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 SACROSPINUS FIXATION, AUGMENTATION WITH AXIS DERMIS;  Surgeon: Carolan Clines, MD;  Location: [facility];  Service: Urology;  Laterality: N/A;   Social History   Socioeconomic History   Marital status: Single    Spouse name: Not on file   Number of children: Not on file   Years of education: Not on file   Highest education level: Not on file  Occupational History   Not on file  Tobacco Use   Smoking status: Former  Years: 1.00    Types: Cigarettes    Quit date: 02/28/1996    Years since quitting: 25.3   Smokeless tobacco: Never  Vaping Use   Vaping Use: Never used  Substance and Sexual Activity   Alcohol use: Yes    Alcohol/week: 4.0 standard drinks    Types: 4 Standard drinks or equivalent per week    Comment: on weekends   Drug use: No   Sexual activity: Not on file  Other Topics Concern   Not on file  Social History Narrative   Single   Former Smoker  -  quit 10 to 11 years ago (light smoker)    Alcohol use-yes     2 children    Occupation: [employer]    Social Determinants of Health   Financial Resource Strain: Not on file  Food Insecurity: Not on file  Transportation Needs: Not on file  Physical Activity: Not on file  Stress: Not on file  Social Connections: Not on file   Allergies  Allergen Reactions   Penicillins Hives    Has patient had a PCN reaction causing immediate rash, facial/tongue/throat swelling, SOB or lightheadedness with hypotension: Yes Has patient had a PCN reaction causing severe rash involving mucus membranes or skin necrosis: No Has patient had a PCN reaction that required hospitalization: No Has patient had a PCN reaction occurring within the last 10 years: No If all of the above answers are "NO", then may proceed with Cephalosporin use.    Family History  Problem Relation Age of Onset   Aneurysm Mother 9       deceased secondary to brain aneurysm   Liver cancer Father 13       deceased   Diabetes Other        grandmother   Colon cancer Neg Hx    Stomach cancer Neg Hx    Rectal cancer Neg Hx    Esophageal cancer Neg Hx    Breast cancer Neg Hx      Current Outpatient Medications (Cardiovascular):    lisinopril (ZESTRIL) 10 MG tablet, TAKE 1 TABLET(10 MG) BY MOUTH DAILY   rosuvastatin (CRESTOR) 40 MG tablet, TAKE 1 TABLET(40 MG) BY MOUTH DAILY  Current Outpatient Medications (Respiratory):    fexofenadine (ALLEGRA) 180 MG tablet, Take 180 mg by mouth daily.   fluticasone (FLONASE) 50 MCG/ACT nasal spray, SHAKE LIQUID AND USE 2 SPRAYS IN EACH NOSTRIL DAILY  Current Outpatient Medications (Analgesics):    aspirin 81 MG tablet, Take 1 tablet (81 mg total) by mouth 2 (two) times daily after a meal. (Patient taking differently: Take 81 mg by mouth daily.)   HYDROcodone-acetaminophen (NORCO/VICODIN) 5-325 MG tablet, Take 1-2 tablets by mouth every 6 (six) hours as needed for moderate pain or severe pain (post op  pain).  Current Outpatient Medications (Hematological):    Cyanocobalamin (VITAMIN B-12) 2500 MCG SUBL, Place 2,500 mcg under the tongue daily.  Current Outpatient Medications (Other):    Ascorbic Acid (VITAMIN C) 1000 MG tablet, Take 1,000 mg by mouth daily.   b complex vitamins capsule, Take 1 capsule by mouth daily.   Biotin 1000 MCG tablet, Take 1,000 mcg by mouth daily.   blood glucose meter kit and supplies KIT, Dispense based on patient and insurance preference. Use once daily to test blood glucose. (FOR ICD-E11.9).   Calcium Carbonate-Vitamin D 600-400 MG-UNIT tablet, Take 1 tablet by mouth daily.   Chromium 1000 MCG TABS, Take 1,000 mcg by mouth daily.  Cinnamon 500 MG capsule, Take 1,000 mg by mouth daily.   CRANBERRY PO, Take 2 tablets by mouth daily. 4200 mg   cyclobenzaprine (FLEXERIL) 10 MG tablet, Take 1 tablet (10 mg total) by mouth at bedtime.   ELDERBERRY PO, Take 1 capsule by mouth daily.   estradiol (ESTRACE) 0.1 MG/GM vaginal cream, Place 1 Applicatorful vaginally every other day. In the evening   gabapentin (NEURONTIN) 100 MG capsule, TAKE 2 CAPSULES(200 MG) BY MOUTH AT BEDTIME   Lancets (ONETOUCH DELICA PLUS [id number]) MISC, USE ONCE DAILY   Misc Natural Products (TART CHERRY ADVANCED) CAPS, Take 2 capsules by mouth daily.   Multiple Vitamins-Minerals (MULTIVITAMIN WITH MINERALS) tablet, Take 1 tablet by mouth daily. Centrum silver   Omega-3 1000 MG CAPS, Take 1,000 mg by mouth daily.   ONETOUCH VERIO test strip, USE TO TEST BLOOD GLUCOSE TWICE DAILY   OVER THE COUNTER MEDICATION, Take 1 tablet by mouth daily as needed (constipation). Vital Lax   polyethylene glycol (MIRALAX / GLYCOLAX) packet, Take 17 g by mouth daily as needed for mild constipation.   tiZANidine (ZANAFLEX) 4 MG tablet, Take 1 tablet (4 mg total) by mouth every 6 (six) hours as needed.   traZODone (DESYREL) 50 MG tablet, TAKE 1/2 TO 1 TABLET(25 TO 50 MG) BY MOUTH AT BEDTIME AS NEEDED FOR SLEEP    trimethoprim (TRIMPEX) 100 MG tablet, Take 100 mg by mouth as needed.   Turmeric 500 MG CAPS, Take 1,000 mg by mouth daily.    Reviewed prior external information including notes and imaging from  primary care provider As well as notes that were available from care everywhere and other healthcare systems.  Past medical history, social, surgical and family history all reviewed in electronic medical record.  No pertanent information unless stated regarding to the chief complaint.   Review of Systems:  No headache, visual changes, nausea, vomiting, diarrhea, constipation, dizziness, abdominal pain, skin rash, fevers, chills, night sweats, weight loss, swollen lymph nodes, body aches,aches, body aches  Objective  Blood pressure 124/82, pulse 80, height 5' 4"  (1.626 m), weight 143 lb (64.9 kg), SpO2 98 %.   General: No apparent distress alert and oriented x3 mood and affect normal, dressed appropriately.  HEENT: Pupils equal, extraocular movements intact  Respiratory: Patient's speak in full sentences and does not appear short of breath  Cardiovascular: No lower extremity edema, non tender, no erythema  Gait severely antalgic Knee exam shows severe arthritic changes bilaterally with instability with valgus and varus force. Left hip exam shows the patient is minorly tender over the femur itself on the lateral aspect.  Patient has minimal pain over the greater trochanteric area on the left side.  Negative straight leg test. Hand exam bilaterally shows mild positive Tinel's and positive Phalen's left greater than right.  Good grip strength is still noted.  Significant arthritic changes of the hands bilaterally.  Procedure: Real-time Ultrasound Guided Injection of left carpal tunnel Device: GE Logiq Q7 Ultrasound guided injection is preferred based studies that show increased duration, increased effect, greater accuracy, decreased procedural pain, increased response rate with ultrasound guided  versus blind injection.  Verbal informed consent obtained.  Time-out conducted.  Noted no overlying erythema, induration, or other signs of local infection.  Skin prepped in a sterile fashion.  Local anesthesia: Topical Ethyl chloride.  With sterile technique and under real time ultrasound guidance:  median nerve visualized.  23g 5/8 inch needle inserted distal to proximal approach into nerve sheath. Pictures taken nfor  needle placement. Patient did have injection of 0.5 cc of 0.5% Marcaine and 0.5 cc of Kenalog 40 mg/dL. Completed without difficulty  Pain immediately improved suggesting accurate placement of the medication.  Advised to call if fevers/chills, erythema, induration, drainage, or persistent bleeding.  Impression: Technically successful ultrasound guided injection.  Procedure: Real-time Ultrasound Guided Injection of left femoral cutaneous nerve Device: GE Logiq Q7 Ultrasound guided injection is preferred based studies that show increased duration, increased effect, greater accuracy, decreased procedural pain, increased response rate, and decreased cost with ultrasound guided versus blind injection.  Verbal informed consent obtained.  Time-out conducted.  Noted no overlying erythema, induration, or other signs of local infection.  Skin prepped in a sterile fashion.  Local anesthesia: Topical Ethyl chloride.  With sterile technique and under real time ultrasound guidance: With a 21-gauge 2 inch needle difficult to assess on ultrasound fully but did find what appears to be cutaneous femoral nerve with mild dilatation.  Injected with 0.5 cc of 0.5% Marcaine and 1 cc of Kenalog 40 mg/mL. Completed without difficulty   Advised to call if fevers/chills, erythema, induration, drainage, or persistent bleeding.  Impression: Technically successful ultrasound guided injection. Impression and Recommendations:     The above documentation has been reviewed and is accurate and complete  [name], DO

## 2021-06-18 ENCOUNTER — Other Ambulatory Visit: Payer: Self-pay

## 2021-06-18 ENCOUNTER — Encounter: Payer: Self-pay | Admitting: Family Medicine

## 2021-06-18 ENCOUNTER — Ambulatory Visit (INDEPENDENT_AMBULATORY_CARE_PROVIDER_SITE_OTHER): Payer: Medicare Other | Admitting: Family Medicine

## 2021-06-18 ENCOUNTER — Ambulatory Visit: Payer: Self-pay

## 2021-06-18 ENCOUNTER — Ambulatory Visit (INDEPENDENT_AMBULATORY_CARE_PROVIDER_SITE_OTHER): Payer: Medicare Other

## 2021-06-18 VITALS — BP 124/82 | HR 80 | Ht 64.0 in | Wt 143.0 lb

## 2021-06-18 DIAGNOSIS — G8929 Other chronic pain: Secondary | ICD-10-CM | POA: Diagnosis not present

## 2021-06-18 DIAGNOSIS — M170 Bilateral primary osteoarthritis of knee: Secondary | ICD-10-CM | POA: Diagnosis not present

## 2021-06-18 DIAGNOSIS — M25552 Pain in left hip: Secondary | ICD-10-CM

## 2021-06-18 DIAGNOSIS — Z96642 Presence of left artificial hip joint: Secondary | ICD-10-CM

## 2021-06-18 DIAGNOSIS — G5603 Carpal tunnel syndrome, bilateral upper limbs: Secondary | ICD-10-CM

## 2021-06-18 NOTE — Assessment & Plan Note (Signed)
Patient feels like the steroid injections do not help significantly at this time.  Patient does feel the viscosupplementation does give her some relief.  Patient is still trying to avoid surgical intervention if possible.  Patient will follow up with me again 2 to 3 weeks and 1 as long as we get approval we will start the viscosupplementation again

## 2021-06-18 NOTE — Assessment & Plan Note (Signed)
Patient given injection.  We will get x-rays to further evaluate the setting of the hip joint but I do feel it is stable.  Patient denies any instability.  Just having more of a numbness on the side of the leg.  Follow-up with me again in 6 to 8 weeks

## 2021-06-18 NOTE — Patient Instructions (Addendum)
Xray today Tried injection on hip Exercises See me again on

## 2021-06-18 NOTE — Assessment & Plan Note (Signed)
Bilaterally.  Patient given injection today to tolerate the procedure well.  Discussed icing regimen and home exercises.  Worsening symptoms may need to consider surgical intervention.  Patient given exercises and will do bracing.

## 2021-06-20 ENCOUNTER — Telehealth: Payer: Self-pay | Admitting: Family Medicine

## 2021-06-20 DIAGNOSIS — M25552 Pain in left hip: Secondary | ICD-10-CM

## 2021-06-20 DIAGNOSIS — Z96642 Presence of left artificial hip joint: Secondary | ICD-10-CM

## 2021-06-20 DIAGNOSIS — G8929 Other chronic pain: Secondary | ICD-10-CM

## 2021-06-20 NOTE — Telephone Encounter (Signed)
Patient called asking if you could call her regarding this message below. She said she had a few questions.  Please call after 10am (she has water aerobics in the am) [phone]

## 2021-06-20 NOTE — Telephone Encounter (Signed)
MRI of pelvis ordered to [facility]

## 2021-06-20 NOTE — Telephone Encounter (Signed)
Called patient and left a message for her to call back with Smith's response. Can this be ordered for her?

## 2021-06-20 NOTE — Telephone Encounter (Signed)
[facility] called to give the results from the patient's hip xray-   EXAM: DG HIP (WITH OR WITHOUT PELVIS) 2-3V LEFT   COMPARISON:  May 1st 2020   FINDINGS: Osteopenia. Status post LEFT hip arthroplasty. Orthopedic hardware is intact and without periprosthetic fracture or lucency. No acute fracture of the LEFT hip. There is a possible linear lucency of the LEFT sacrum. Status post RIGHT hip arthroplasty. Degenerative changes of the lower lumbar spine. Vascular calcifications.   IMPRESSION: 1. Status post LEFT hip arthroplasty without evidence of hardware complication. 2. Possible linear lucency of the LEFT sacrum. This could reflect an insufficiency fracture in the appropriate clinical setting. This could be further assessed with dedicated pelvic MRI if clinically indicated.

## 2021-06-23 NOTE — Telephone Encounter (Signed)
Left message for patient to call back  

## 2021-06-24 ENCOUNTER — Other Ambulatory Visit: Payer: Self-pay

## 2021-06-24 ENCOUNTER — Telehealth: Payer: Self-pay | Admitting: Adult Health

## 2021-06-24 DIAGNOSIS — M1611 Unilateral primary osteoarthritis, right hip: Secondary | ICD-10-CM

## 2021-06-24 DIAGNOSIS — G8929 Other chronic pain: Secondary | ICD-10-CM

## 2021-06-24 NOTE — Telephone Encounter (Signed)
PT called to see if it was time for bloodwork and wanted to know. PT would like a callback if so to setup a time to get them done.

## 2021-06-25 ENCOUNTER — Ambulatory Visit
Admission: RE | Admit: 2021-06-25 | Discharge: 2021-06-25 | Disposition: A | Discharge status: Home / Self Care | Payer: Medicare Other | Source: Ambulatory Visit | Attending: Family Medicine | Admitting: Family Medicine

## 2021-06-25 DIAGNOSIS — S329XXA Fracture of unspecified parts of lumbosacral spine and pelvis, initial encounter for closed fracture: Secondary | ICD-10-CM | POA: Diagnosis not present

## 2021-06-25 DIAGNOSIS — M47816 Spondylosis without myelopathy or radiculopathy, lumbar region: Secondary | ICD-10-CM | POA: Diagnosis not present

## 2021-06-25 DIAGNOSIS — S76311A Strain of muscle, fascia and tendon of the posterior muscle group at thigh level, right thigh, initial encounter: Secondary | ICD-10-CM | POA: Diagnosis not present

## 2021-06-25 DIAGNOSIS — M25552 Pain in left hip: Secondary | ICD-10-CM

## 2021-06-25 DIAGNOSIS — S76312A Strain of muscle, fascia and tendon of the posterior muscle group at thigh level, left thigh, initial encounter: Secondary | ICD-10-CM | POA: Diagnosis not present

## 2021-06-25 DIAGNOSIS — G8929 Other chronic pain: Secondary | ICD-10-CM

## 2021-06-25 NOTE — Telephone Encounter (Signed)
Left message to return phone call.

## 2021-06-25 NOTE — Telephone Encounter (Signed)
Called pt on both phones again no answer.

## 2021-06-26 ENCOUNTER — Telehealth: Payer: Self-pay | Admitting: Family Medicine

## 2021-06-26 ENCOUNTER — Inpatient Hospital Stay: Admission: RE | Admit: 2021-06-26 | Payer: Medicare Other | Source: Ambulatory Visit

## 2021-06-26 NOTE — Telephone Encounter (Signed)
FYI, pt was scheduled for her Dexa today but per [employer] will only cover this screening every 2 years. Pt not due until after 07/2019. She informed patient to reschedule.

## 2021-06-27 ENCOUNTER — Ambulatory Visit (INDEPENDENT_AMBULATORY_CARE_PROVIDER_SITE_OTHER): Payer: Medicare Other | Admitting: Family Medicine

## 2021-06-27 ENCOUNTER — Other Ambulatory Visit: Payer: Self-pay

## 2021-06-27 ENCOUNTER — Encounter: Payer: Self-pay | Admitting: Family Medicine

## 2021-06-27 DIAGNOSIS — G5603 Carpal tunnel syndrome, bilateral upper limbs: Secondary | ICD-10-CM | POA: Diagnosis not present

## 2021-06-27 DIAGNOSIS — M170 Bilateral primary osteoarthritis of knee: Secondary | ICD-10-CM | POA: Diagnosis not present

## 2021-06-27 DIAGNOSIS — S76019A Strain of muscle, fascia and tendon of unspecified hip, initial encounter: Secondary | ICD-10-CM | POA: Diagnosis not present

## 2021-06-27 MED ORDER — PREDNISONE 20 MG PO TABS: 20.0000 mg | ORAL_TABLET | Freq: Every day | ORAL | 0 refills | Status: DC

## 2021-06-27 NOTE — Assessment & Plan Note (Signed)
Chronic problem and right-sided injected today.  Tolerated the procedure well.  Discussed icing regimen and home exercises.  Increase activity slowly.  Follow-up again in 6 weeks

## 2021-06-27 NOTE — Patient Instructions (Addendum)
Good to see you  PT on [address] Left knee injection given today Carpal tunnel injection given today  Prednisone '20mg'$  daily for 5 days starting tomorrow See me again in 6-7 weeks

## 2021-06-27 NOTE — Assessment & Plan Note (Signed)
We will send patient to formal physical therapy.  Short course of low-dose of prednisone to help with some of the of bursitis noted.  Discussed icing regimen.  Discussed avoiding certain activities.  Follow-up again in 6 to 8 weeks

## 2021-06-27 NOTE — Progress Notes (Signed)
[name] [place] Phone: [phone] Subjective:   Cathy [name], am serving as a scribe for Dr. Hulan Saas.  I'm seeing this patient by the request  of:  [name], NP  Last visit 06/27/2021 Patient given injection.  We will get x-rays to further evaluate the setting of the hip joint but I do feel it is stable.  Patient denies any instability.  Just having more of a numbness on the side of the leg.  Follow-up with me again in 6 to 8 weeks CC:   [id number]  Cathy [name] is a 75 y.o. female coming in with complaint of Left Hip pain follow up from MRI from Wednesday. Patient states that she has good days and bad days, but is just worried about the MRI results.  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  Patient did have a underlying gluteal bursitis also noted.  Knees are hurting patient again.  He has had viscosupplementation previously 6 months ago that did help initially.  He started having increasing discomfort again. Patient was to avoid all surgical intervention.  Right wrist pain.  He did have an injection on the left side for carpal tunnel but did not do significantly better.  Would like one on the right side.      Past Medical History:  Diagnosis Date   Chronic constipation    DJD (degenerative joint disease)    knees   Female cystocele    GERD (gastroesophageal reflux disease)    History of esophagitis    History of left inguinal hernia    History of MRSA infection    recurrent carbuncle   History of recurrent UTIs    Hyperlipidemia    Hypertension    Pre-diabetes    diet controlled   Urgency of urination    Past Surgical History:  Procedure Laterality Date   APPENDECTOMY  1980's   BREAST EXCISIONAL BIOPSY Left 1989   COLONOSCOPY  last one 06-18-2015   INGUINAL HERNIA REPAIR Left 05-10-2001   LAPAROSCOPIC LYSIS OF  ADHESIONS  01/17/2019   OOPHORECTOMY Right 01/17/2019   PERINEOPLASTY  01/17/2019   SALPINGECTOMY Left 01/17/2019   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 SACROSPINUS FIXATION, AUGMENTATION WITH AXIS DERMIS;  Surgeon: Carolan Clines, MD;  Location: [facility];  Service: Urology;  Laterality: N/A;   Social History   Socioeconomic History   Marital status: Single    Spouse name: Not on file   Number of children: Not on file   Years of education: Not on file   Highest education level: Not on file  Occupational History   Not on file  Tobacco Use   Smoking status: Former    Years: 1.00    Types: Cigarettes    Quit date: 02/28/1996    Years since quitting: 25.3   Smokeless tobacco: Never  Vaping Use   Vaping Use: Never used  Substance and Sexual Activity  Alcohol use: Yes    Alcohol/week: 4.0 standard drinks    Types: 4 Standard drinks or equivalent per week    Comment: on weekends   Drug use: No   Sexual activity: Not on file  Other Topics Concern   Not on file  Social History Narrative   Single   Former Smoker  -  quit 10 to 11 years ago (light smoker)   Alcohol use-yes     2 children    Occupation: [employer]    Social Determinants of Health   Financial Resource Strain: Not on file  Food Insecurity: Not on file  Transportation Needs: Not on file  Physical Activity: Not on file  Stress: Not on file  Social Connections: Not on file   Allergies  Allergen  Reactions   Penicillins Hives    Has patient had a PCN reaction causing immediate rash, facial/tongue/throat swelling, SOB or lightheadedness with hypotension: Yes Has patient had a PCN reaction causing severe rash involving mucus membranes or skin necrosis: No Has patient had a PCN reaction that required hospitalization: No Has patient had a PCN reaction occurring within the last 10 years: No If all of the above answers are "NO", then may proceed with Cephalosporin use.    Family History  Problem Relation Age of Onset   Aneurysm Mother 48       deceased secondary to brain aneurysm   Liver cancer Father 60       deceased   Diabetes Other        grandmother   Colon cancer Neg Hx    Stomach cancer Neg Hx    Rectal cancer Neg Hx    Esophageal cancer Neg Hx    Breast cancer Neg Hx     Current Outpatient Medications (Endocrine & Metabolic):    predniSONE (DELTASONE) 20 MG tablet, Take 1 tablet (20 mg total) by mouth daily.  Current Outpatient Medications (Cardiovascular):    lisinopril (ZESTRIL) 10 MG tablet, TAKE 1 TABLET(10 MG) BY MOUTH DAILY   rosuvastatin (CRESTOR) 40 MG tablet, TAKE 1 TABLET(40 MG) BY MOUTH DAILY  Current Outpatient Medications (Respiratory):    fexofenadine (ALLEGRA) 180 MG tablet, Take 180 mg by mouth daily.   fluticasone (FLONASE) 50 MCG/ACT nasal spray, SHAKE LIQUID AND USE 2 SPRAYS IN EACH NOSTRIL DAILY  Current Outpatient Medications (Analgesics):    aspirin 81 MG tablet, Take 1 tablet (81 mg total) by mouth 2 (two) times daily after a meal. (Patient taking differently: Take 81 mg by mouth daily.)   HYDROcodone-acetaminophen (NORCO/VICODIN) 5-325 MG tablet, Take 1-2 tablets by mouth every 6 (six) hours as needed for moderate pain or severe pain (post op pain).  Current Outpatient Medications (Hematological):    Cyanocobalamin (VITAMIN B-12) 2500 MCG SUBL, Place 2,500 mcg under the tongue daily.  Current Outpatient Medications (Other):    Ascorbic Acid  (VITAMIN C) 1000 MG tablet, Take 1,000 mg by mouth daily.   b complex vitamins capsule, Take 1 capsule by mouth daily.   Biotin 1000 MCG tablet, Take 1,000 mcg by mouth daily.   blood glucose meter kit and supplies KIT, Dispense based on patient and insurance preference. Use once daily to test blood glucose. (FOR ICD-E11.9).   Calcium Carbonate-Vitamin D 600-400 MG-UNIT tablet, Take 1 tablet by mouth daily.   Chromium 1000 MCG TABS, Take 1,000 mcg by mouth daily.   Cinnamon 500 MG capsule, Take 1,000 mg by mouth daily.   CRANBERRY PO, Take 2  tablets by mouth daily. 4200 mg   cyclobenzaprine (FLEXERIL) 10 MG tablet, Take 1 tablet (10 mg total) by mouth at bedtime.   ELDERBERRY PO, Take 1 capsule by mouth daily.   estradiol (ESTRACE) 0.1 MG/GM vaginal cream, Place 1 Applicatorful vaginally every other day. In the evening   gabapentin (NEURONTIN) 100 MG capsule, TAKE 2 CAPSULES(200 MG) BY MOUTH AT BEDTIME   Lancets (ONETOUCH DELICA PLUS [id number]) MISC, USE ONCE DAILY   Misc Natural Products (TART CHERRY ADVANCED) CAPS, Take 2 capsules by mouth daily.   Multiple Vitamins-Minerals (MULTIVITAMIN WITH MINERALS) tablet, Take 1 tablet by mouth daily. Centrum silver   Omega-3 1000 MG CAPS, Take 1,000 mg by mouth daily.   ONETOUCH VERIO test strip, USE TO TEST BLOOD GLUCOSE TWICE DAILY   OVER THE COUNTER MEDICATION, Take 1 tablet by mouth daily as needed (constipation). Vital Lax   polyethylene glycol (MIRALAX / GLYCOLAX) packet, Take 17 g by mouth daily as needed for mild constipation.   tiZANidine (ZANAFLEX) 4 MG tablet, Take 1 tablet (4 mg total) by mouth every 6 (six) hours as needed.   traZODone (DESYREL) 50 MG tablet, TAKE 1/2 TO 1 TABLET(25 TO 50 MG) BY MOUTH AT BEDTIME AS NEEDED FOR SLEEP   trimethoprim (TRIMPEX) 100 MG tablet, Take 100 mg by mouth as needed.   Turmeric 500 MG CAPS, Take 1,000 mg by mouth daily.    Reviewed prior external information including notes and imaging from  primary  care provider As well as notes that were available from care everywhere and other healthcare systems.  Past medical history, social, surgical and family history all reviewed in electronic medical record.  No pertanent information unless stated regarding to the chief complaint.   Review of Systems:  No headache, visual changes, nausea, vomiting, diarrhea, constipation, dizziness, abdominal pain, skin rash, fevers, chills, night sweats, weight loss, swollen lymph nodes, body aches, joint swelling, chest pain, shortness of breath, mood changes. POSITIVE muscle aches  Objective  Blood pressure 130/70, pulse 87, height 5' 4"  (1.626 m), weight 143 lb (64.9 kg), SpO2 97 %.   General: No apparent distress alert and oriented x3 mood and affect normal, dressed appropriately.  HEENT: Pupils equal, extraocular movements intact  Respiratory: Patient's speak in full sentences and does not appear short of breath  Cardiovascular: No lower extremity edema, non tender, no erythema  Gait severely antalgic MSK: Knee: Bilateral valgus deformity noted. Large thigh to calf ratio.  Tender to palpation over medial and PF joint line.  ROM full in flexion and extension and lower leg rotation. instability with valgus force.  painful patellar compression. Patellar glide with moderate crepitus. Patellar and quadriceps tendons unremarkable. Hamstring and quadriceps strength is normal.  After informed written and verbal consent, patient was seated on exam table. Right knee was prepped with alcohol swab and utilizing anterolateral approach, patient's right knee space was injected with 60 mg per 3 mL of Synvisc (sodium hyaluronate) in a prefilled syringe was injected easily into the knee through a 22-gauge needle..Patient tolerated the procedure well without immediate complications.  After informed written and verbal consent, patient was seated on exam table. Left knee was prepped with alcohol swab and utilizing  anterolateral approach, patient's left knee space was injected with 60 mg per 3 mL of Synvisc (sodium hyaluronate) in a prefilled syringe was injected easily into the knee through a 22-gauge needle..Patient tolerated the procedure well without immediate complications.  After verbal consent patient was prepped with alcohol swab and  with a 25-gauge half inch needle injected into the right median nerve with a total of 0.5 cc of 0.5% Marcaine and 0.5 cc of Kenalog 40 mg/mL.  No blood loss.  Postinjection instructions given. Impression and Recommendations:     The above documentation has been reviewed and is accurate and complete [name], DO

## 2021-06-27 NOTE — Assessment & Plan Note (Addendum)
Chronic, with exacerbation.  Given a bilateral knee viscosupplementation injection .  Hopefully that this will help patient aid in some of her recovery with the gluteal tendon tear.  Follow-up again in 6 to 8 weeks

## 2021-07-01 ENCOUNTER — Other Ambulatory Visit: Payer: Self-pay | Admitting: Adult Health

## 2021-07-01 DIAGNOSIS — E1121 Type 2 diabetes mellitus with diabetic nephropathy: Secondary | ICD-10-CM

## 2021-07-02 NOTE — Telephone Encounter (Signed)
Calle pt again no answer. Closing note will await on return call.

## 2021-07-11 ENCOUNTER — Ambulatory Visit (INDEPENDENT_AMBULATORY_CARE_PROVIDER_SITE_OTHER): Payer: Medicare Other | Admitting: Adult Health

## 2021-07-11 ENCOUNTER — Other Ambulatory Visit: Payer: Self-pay

## 2021-07-11 ENCOUNTER — Encounter: Payer: Self-pay | Admitting: Adult Health

## 2021-07-11 VITALS — BP 138/60 | HR 89 | Temp 98.3°F | Ht 64.0 in | Wt 143.0 lb

## 2021-07-11 DIAGNOSIS — R350 Frequency of micturition: Secondary | ICD-10-CM

## 2021-07-11 DIAGNOSIS — J0100 Acute maxillary sinusitis, unspecified: Secondary | ICD-10-CM | POA: Diagnosis not present

## 2021-07-11 LAB — POCT URINALYSIS DIPSTICK
Bilirubin, UA: NEGATIVE
Blood, UA: NEGATIVE
Glucose, UA: NEGATIVE
Ketones, UA: NEGATIVE
Leukocytes, UA: NEGATIVE
Nitrite, UA: NEGATIVE
Protein, UA: NEGATIVE
Spec Grav, UA: 1.01 (ref 1.010–1.025)
Urobilinogen, UA: 0.2 E.U./dL
pH, UA: 6 (ref 5.0–8.0)

## 2021-07-11 MED ORDER — DOXYCYCLINE HYCLATE 100 MG PO CAPS: 100.0000 mg | ORAL_CAPSULE | Freq: Two times a day (BID) | ORAL | 0 refills | Status: DC

## 2021-07-11 NOTE — Progress Notes (Signed)
Subjective:    Patient ID: Cathy [name], female    DOB: 1946-08-11, 75 y.o.   MRN: [id number]  HPI 75 year old female who  has a past medical history of Chronic constipation, DJD (degenerative joint disease), Female cystocele, GERD (gastroesophageal reflux disease), History of esophagitis, History of left inguinal hernia, History of MRSA infection, History of recurrent UTIs, Hyperlipidemia, Hypertension, Pre-diabetes, and Urgency of urination.  She presents to the office today for an acute issue   She reports that over the last six days she has been experiencing headaches, sinus pain and pressure, and discolored sinus drainage.  She reports that the sinus pain and pressure seems to be get worse as the week has gone on.  She denies fevers or chills.  No sore throat, ear pain, shortness of breath, chest pain, or wheezing.  He also reports that she is concerned that she might have a urinary tract infection.  Reports some intermittent burning with urinary frequency.  This is a chronic issue for her.  Has had history of bladder surgery.  Just wants to make sure she does have a UTI   Review of Systems See HPI   Past Medical History:  Diagnosis Date   Chronic constipation    DJD (degenerative joint disease)    knees   Female cystocele    GERD (gastroesophageal reflux disease)    History of esophagitis    History of left inguinal hernia    History of MRSA infection    recurrent carbuncle   History of recurrent UTIs    Hyperlipidemia    Hypertension    Pre-diabetes    diet controlled   Urgency of urination     Social History   Socioeconomic History   Marital status: Single    Spouse name: Not on file   Number of children: Not on file   Years of education: Not on file   Highest education level: Not on file  Occupational History   Not on file  Tobacco Use   Smoking status: Former    Years: 1.00    Types: Cigarettes    Quit date: 02/28/1996    Years since quitting: 25.3    Smokeless tobacco: Never  Vaping Use   Vaping Use: Never used  Substance and Sexual Activity   Alcohol use: Yes    Alcohol/week: 4.0 standard drinks    Types: 4 Standard drinks or equivalent per week    Comment: on weekends   Drug use: No   Sexual activity: Not on file  Other Topics Concern   Not on file  Social History Narrative   Single   Former Smoker  -  quit 10 to 11 years ago (light smoker)   Alcohol use-yes     2 children    Occupation: [employer]    Social Determinants of Health   Financial Resource Strain: Not on file  Food Insecurity: Not on file  Transportation Needs: Not on file  Physical Activity: Not on file  Stress: Not on file  Social Connections: Not on file  Intimate Partner Violence: Not on file    Past Surgical History:  Procedure Laterality Date   APPENDECTOMY  1980's   BREAST EXCISIONAL BIOPSY Left 1989   COLONOSCOPY  last one 06-18-2015   INGUINAL HERNIA REPAIR Left 05-10-2001   LAPAROSCOPIC LYSIS OF ADHESIONS  01/17/2019   OOPHORECTOMY Right 01/17/2019   PERINEOPLASTY  01/17/2019   SALPINGECTOMY Left 01/17/2019   TOTAL HIP  ARTHROPLASTY Right 04/09/2020   Procedure: RIGH TOTAL HIP ARTHROPLASTY ANTERIOR APPROACH;  Surgeon: Melrose Nakayama, MD;  Location: [facility];  Service: Orthopedics;  Laterality: Right;   TOTAL HIP ARTHROPLASTY Left 01/28/2021   Procedure: LEFT TOTAL HIP ARTHROPLASTY ANTERIOR APPROACH;  Surgeon: Melrose Nakayama, MD;  Location: [facility];  Service: Orthopedics;  Laterality: Left;   VAGINAL HYSTERECTOMY  1980's   VAGINAL PROLAPSE REPAIR N/A 03/02/2016   Procedure: COLOPLAST ANTERIOR  VAULT REPAIR WITH AXIS DERMIS. SACROSPINUS FIXATION, AUGMENTATION WITH AXIS DERMIS;  Surgeon: Carolan Clines, MD;  Location: [facility];  Service: Urology;  Laterality: N/A;    Family History  Problem Relation Age of Onset   Aneurysm Mother 82       deceased secondary to brain aneurysm   Liver cancer Father 80        deceased   Diabetes Other        grandmother   Colon cancer Neg Hx    Stomach cancer Neg Hx    Rectal cancer Neg Hx    Esophageal cancer Neg Hx    Breast cancer Neg Hx     Allergies  Allergen Reactions   Penicillins Hives    Has patient had a PCN reaction causing immediate rash, facial/tongue/throat swelling, SOB or lightheadedness with hypotension: Yes Has patient had a PCN reaction causing severe rash involving mucus membranes or skin necrosis: No Has patient had a PCN reaction that required hospitalization: No Has patient had a PCN reaction occurring within the last 10 years: No If all of the above answers are "NO", then may proceed with Cephalosporin use.     Current Outpatient Medications on File Prior to Visit  Medication Sig Dispense Refill   Ascorbic Acid (VITAMIN C) 1000 MG tablet Take 1,000 mg by mouth daily.     aspirin 81 MG tablet Take 1 tablet (81 mg total) by mouth 2 (two) times daily after a meal. (Patient taking differently: Take 81 mg by mouth daily.) 60 tablet 0   b complex vitamins capsule Take 1 capsule by mouth daily.     Biotin 1000 MCG tablet Take 1,000 mcg by mouth daily.     Blood Glucose Monitoring Suppl (ONETOUCH VERIO FLEX SYSTEM) w/Device KIT TEST TWICE DAILY 1 kit 0   Calcium Carbonate-Vitamin D 600-400 MG-UNIT tablet Take 1 tablet by mouth daily.     Chromium 1000 MCG TABS Take 1,000 mcg by mouth daily.     Cinnamon 500 MG capsule Take 1,000 mg by mouth daily.     CRANBERRY PO Take 2 tablets by mouth daily. 4200 mg     Cyanocobalamin (VITAMIN B-12) 2500 MCG SUBL Place 2,500 mcg under the tongue daily.     cyclobenzaprine (FLEXERIL) 10 MG tablet Take 1 tablet (10 mg total) by mouth at bedtime. 5 tablet 0   ELDERBERRY PO Take 1 capsule by mouth daily.     estradiol (ESTRACE) 0.1 MG/GM vaginal cream Place 1 Applicatorful vaginally every other day. In the evening  3   fexofenadine (ALLEGRA) 180 MG tablet Take 180 mg by mouth daily.     fluticasone  (FLONASE) 50 MCG/ACT nasal spray SHAKE LIQUID AND USE 2 SPRAYS IN EACH NOSTRIL DAILY 48 g 2   gabapentin (NEURONTIN) 100 MG capsule TAKE 2 CAPSULES(200 MG) BY MOUTH AT BEDTIME 180 capsule 0   HYDROcodone-acetaminophen (NORCO/VICODIN) 5-325 MG tablet Take 1-2 tablets by mouth every 6 (six) hours as needed for moderate pain or severe pain (post op pain). Sharp  tablet 0   Lancets (ONETOUCH DELICA PLUS [id number]) MISC USE ONCE DAILY 100 each 3   lisinopril (ZESTRIL) 10 MG tablet TAKE 1 TABLET(10 MG) BY MOUTH DAILY 90 tablet 3   Misc Natural Products (TART CHERRY ADVANCED) CAPS Take 2 capsules by mouth daily.     Multiple Vitamins-Minerals (MULTIVITAMIN WITH MINERALS) tablet Take 1 tablet by mouth daily. Centrum silver     Omega-3 1000 MG CAPS Take 1,000 mg by mouth daily.     ONETOUCH VERIO test strip USE TWICE DAILY 200 strip 3   OVER THE COUNTER MEDICATION Take 1 tablet by mouth daily as needed (constipation). Vital Lax     polyethylene glycol (MIRALAX / GLYCOLAX) packet Take 17 g by mouth daily as needed for mild constipation.     predniSONE (DELTASONE) 20 MG tablet Take 1 tablet (20 mg total) by mouth daily. 5 tablet 0   rosuvastatin (CRESTOR) 40 MG tablet TAKE 1 TABLET(40 MG) BY MOUTH DAILY 90 tablet 1   tiZANidine (ZANAFLEX) 4 MG tablet Take 1 tablet (4 mg total) by mouth every 6 (six) hours as needed. 40 tablet 1   traZODone (DESYREL) 50 MG tablet TAKE 1/2 TO 1 TABLET(25 TO 50 MG) BY MOUTH AT BEDTIME AS NEEDED FOR SLEEP 30 tablet 3   trimethoprim (TRIMPEX) 100 MG tablet Take 100 mg by mouth as needed.     Turmeric 500 MG CAPS Take 1,000 mg by mouth daily.      [DISCONTINUED] valsartan (DIOVAN) 160 MG tablet Take 1/2 tablet by mouth once daily 30 tablet 5   No current facility-administered medications on file prior to visit.    BP 138/60   Pulse 89   Temp 98.3 F (36.8 C) (Oral)   Ht 5' 4"  (1.626 m)   Wt 143 lb (64.9 kg)   SpO2 99%   BMI 24.55 kg/m       Objective:   Physical  Exam Vitals reviewed.  Constitutional:      Appearance: Normal appearance.  HENT:     Right Ear: Tympanic membrane, ear canal and external ear normal.     Left Ear: Tympanic membrane, ear canal and external ear normal.     Nose: Congestion and rhinorrhea present. Rhinorrhea is purulent.     Right Sinus: Maxillary sinus tenderness and frontal sinus tenderness present.     Left Sinus: Maxillary sinus tenderness and frontal sinus tenderness present.     Mouth/Throat:     Mouth: Mucous membranes are dry.  Cardiovascular:     Rate and Rhythm: Normal rate and regular rhythm.     Pulses: Normal pulses.     Heart sounds: Normal heart sounds.  Pulmonary:     Effort: Pulmonary effort is normal.     Breath sounds: Normal breath sounds.  Abdominal:     General: Abdomen is flat.     Tenderness: There is right CVA tenderness and left CVA tenderness.  Skin:    General: Skin is warm and dry.  Neurological:     General: No focal deficit present.     Mental Status: She is alert and oriented to person, place, and time.  Psychiatric:        Mood and Affect: Mood normal.        Behavior: Behavior normal.        Thought Content: Thought content normal.        Judgment: Judgment normal.      Assessment & Plan:  1. Acute non-recurrent maxillary sinusitis -  Will treat for sinusitis.  - Follow up if no improvement in the next 2-3 days  - doxycycline (VIBRAMYCIN) 100 MG capsule; Take 1 capsule (100 mg total) by mouth 2 (two) times daily.  Dispense: 14 capsule; Refill: 0  2. Frequent urination - UA negative for UTI - POC Urinalysis Dipstick  Time spent on chart review, time with patient; discussion of sinusitis and frequent urination,  treatment, follow up plan, and documentation 30 minutes  [name], NP

## 2021-07-11 NOTE — Patient Instructions (Signed)
I am going to send in some doxycycline for your sinus infection   Follow up for your physical after [facility]

## 2021-07-15 ENCOUNTER — Ambulatory Visit: Payer: Medicare Other | Admitting: Physical Therapy

## 2021-07-16 ENCOUNTER — Other Ambulatory Visit: Payer: Self-pay

## 2021-07-16 ENCOUNTER — Ambulatory Visit: Payer: Medicare Other | Attending: Family Medicine | Admitting: Physical Therapy

## 2021-07-16 ENCOUNTER — Encounter: Payer: Self-pay | Admitting: Physical Therapy

## 2021-07-16 DIAGNOSIS — G8929 Other chronic pain: Secondary | ICD-10-CM | POA: Diagnosis not present

## 2021-07-16 DIAGNOSIS — M25552 Pain in left hip: Secondary | ICD-10-CM | POA: Insufficient documentation

## 2021-07-16 DIAGNOSIS — M545 Low back pain, unspecified: Secondary | ICD-10-CM | POA: Diagnosis not present

## 2021-07-16 NOTE — Therapy (Signed)
[place], [place], [address] Phone: [phone]   Fax:  [phone]  Physical Therapy Evaluation  Patient Details  Name: Cathy [name] MRN: [id number] Date of Birth: 02-08-1946 Referring Provider (PT): Tear of gluteus maximus tendon [phone])   Encounter Date: 07/16/2021   PT End of Session - 07/16/21 1504     Visit Number 1    Number of Visits 17    Date for PT Re-Evaluation 09/10/21    Authorization Type [employer] MCR: Kx mod at 15th visit, FOTO 6th and 10th    Progress Note Due on Visit 10    PT Start Time 1501    PT Stop Time 1630    PT Time Calculation (min) 89 min    Activity Tolerance Patient tolerated treatment well    Behavior During Therapy [facility] for tasks assessed/performed             Past Medical History:  Diagnosis Date   Chronic constipation    DJD (degenerative joint disease)    knees   Female cystocele    GERD (gastroesophageal reflux disease)    History of esophagitis    History of left inguinal hernia    History of MRSA infection    recurrent carbuncle   History of recurrent UTIs    Hyperlipidemia    Hypertension    Pre-diabetes    diet controlled   Urgency of urination     Past Surgical History:  Procedure Laterality Date   APPENDECTOMY  1980's   BREAST EXCISIONAL BIOPSY Left 1989   COLONOSCOPY  last one 06-18-2015   INGUINAL HERNIA REPAIR Left 05-10-2001   LAPAROSCOPIC LYSIS OF ADHESIONS  01/17/2019   OOPHORECTOMY Right 01/17/2019   PERINEOPLASTY  01/17/2019   SALPINGECTOMY Left 01/17/2019   TOTAL HIP ARTHROPLASTY Right 04/09/2020   Procedure: RIGH TOTAL HIP ARTHROPLASTY ANTERIOR APPROACH;  Surgeon: Melrose Nakayama, MD;  Location: [facility];  Service: Orthopedics;  Laterality: Right;   TOTAL HIP ARTHROPLASTY Left 01/28/2021   Procedure: LEFT TOTAL HIP ARTHROPLASTY ANTERIOR APPROACH;  Surgeon: Melrose Nakayama, MD;  Location: [facility];  Service: Orthopedics;  Laterality:  Left;   VAGINAL HYSTERECTOMY  1980's   VAGINAL PROLAPSE REPAIR N/A 03/02/2016   Procedure: COLOPLAST ANTERIOR  VAULT REPAIR WITH AXIS DERMIS. SACROSPINUS FIXATION, AUGMENTATION WITH AXIS DERMIS;  Surgeon: Carolan Clines, MD;  Location[facility];  Service: Urology;  Laterality: N/A;    There were no vitals filed for this visit.    Subjective Assessment - 07/16/21 1505     Subjective pt is a 75 y.o F with CC of L hip pain that started about a 1 month ago with no report of MOI. Pain is in the L low back, and denies any referred symptoms. Since onset she reports no change in pain. She denies any red flags, and denies any falls.    How long can you sit comfortably? unlimited    How long can you stand comfortably? unlimited    How long can you walk comfortably? unlimited    Diagnostic tests MRI 06/25/2021 IMPRESSION:  No evidence of acute fracture. High-grade partial tearing of the  left gluteus minimus and medius muscles with sub gluteus bursitis.  Mild muscle atrophy suggesting chronic tearing.     Low-grade partial tearing of the right proximal hamstring tendons.     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     Lower lumbar spine degenerative changes with multilevel disc bulging  and varying degrees of spinal canal and neural foraminal narrowing.  This could be further assessed with dedicated lumbar spine MRI if  clinically warranted.    Patient Stated Goals to decrease pain    Currently in Pain? Yes    Pain Score 5    at worst 10/10   Pain Descriptors / Indicators Aching    Pain Type Chronic pain    Pain Onset More than a month ago    Pain Frequency Intermittent    Aggravating Factors  bending forward, mopping,    Pain Relieving Factors medication, heat.    Effect of Pain on Daily Activities limited positional tolerance                [facility] PT Assessment - 07/16/21 1511       Assessment   Medical Diagnosis [name], DO     Referring Provider (PT) Tear of gluteus maximus tendon [phone])    Onset Date/Surgical Date --   1 month ago   Hand Dominance Right    Next MD Visit 08/05/2021    Prior Therapy yes   for the hip     Precautions   Precautions None      Restrictions   Weight Bearing Restrictions No      Balance Screen   Has the patient fallen in the past 6 months No      [place] residence    Living Arrangements Children    Available Help at Discharge Family    Type of [place] Access Level entry    [place] - 2 wheels      Prior Function   Level of Independence Independent with basic ADLs    Vocation Retired      Associate Professor   Overall Cognitive Status Within Functional Limits for tasks assessed      Observation/Other Assessments   Observations genu valgum on the L compared bil      Posture/Postural Control   Posture/Postural Control Postural limitations    Postural Limitations Rounded Shoulders;Forward head      ROM / Strength   AROM / PROM / Strength AROM;Strength      AROM   Overall AROM Comments hip is [facility]    AROM Assessment Site Lumbar;Hip    Lumbar Flexion 80    Lumbar Extension 18    Lumbar - Right Side Bend 10    Lumbar - Left Side Bend 10      Strength   Strength Assessment Site Hip;Knee    Right/Left Hip Right;Left    Right Hip Flexion 4+/5    Right Hip Extension 4-/5    Right Hip ABduction 4-/5    Left Hip Flexion 4/5    Left Hip Extension 4-/5    Left Hip ABduction 3+/5    Right/Left Knee Right;Left    Right Knee Flexion 4+/5    Right Knee Extension 4+/5    Left Knee Flexion 4/5    Left Knee Extension 4/5      Palpation   Palpation comment TTP along the greater trochanter, glute med/ min and max. L lumbar paraspinals      Ambulation/Gait   Gait Pattern Decreased stride length;Decreased trunk rotation;Trendelenburg;Antalgic;Decreased stance time - left;Decreased step length  - right  Objective measurements completed on examination: See above findings.               PT Education - 07/16/21 1516     Education Details evaluation findings, POC, goals, HEP with proper form/ rationale    Person(s) Educated Patient    Methods Explanation;Verbal cues;Handout    Comprehension Verbalized understanding;Verbal cues required              PT Short Term Goals - 07/16/21 1657       PT SHORT TERM GOAL #1   Title pt to be IND with inital HEP    Time 4    Period Weeks    Status New    Target Date 08/13/21      PT SHORT TERM GOAL #2   Title pt to verbalize/ demo efficeint gait pattern utilizing shortened stride with heel strike/ toe off    Time 4    Period Weeks    Status New    Target Date 08/13/21               PT Long Term Goals - 07/16/21 1658       PT LONG TERM GOAL #1   Title increase L hip abductor and extensor  strength to >/=4+/5 to promote hip stability    Time 8    Period Weeks    Status New    Target Date 09/10/21      PT LONG TERM GOAL #2   Title pt to be able to walk/ stand for >/= 60 min demonstrating efficient gait pattern with no report of pain or limtations    Time 8    Period Weeks    Status New    Target Date 09/10/21      PT LONG TERM GOAL #3   Title increase FOTO score to >/=79% to demo improvement in function    Time 8    Period Weeks    Status New    Target Date 09/10/21      PT LONG TERM GOAL #4   Title pt to be IND with advanced HEP to be able to maintain and progress current LOF IND    Time 8    Period Weeks    Status New    Target Date 09/10/21                    Plan - 07/16/21 1651     Clinical Impression Statement pt is a pleasant 75 y.o F presenting to [place] with CC of L hip/ low back pain starting in the last month with no specific MOI. She had a LTHA on 01/28/2021 and has completed PT at another location. She has functional trunk and hip  mobility. MMT revealed gross weakness in the hips bil with L>R. she amb with an antalgic trendelenberg pattern with limited stance on the LLE and stride with RLE. TTP along the greater trochanter gluted med/ min and max    Personal Factors and Comorbidities Comorbidity 1;Age    Comorbidities hx of LTHA    Examination-Activity Limitations Stand;Locomotion Level    Stability/Clinical Decision Making Evolving/Moderate complexity    Clinical Decision Making Moderate    Rehab Potential Good    PT Frequency 2x / week    PT Duration 8 weeks    PT Treatment/Interventions ADLs/Self Care Home Management;Cryotherapy;Electrical Stimulation;Iontophoresis '4mg'$ /ml Dexamethasone;Moist Heat;Ultrasound;Gait training;Stair training;Therapeutic activities;Therapeutic exercise;Balance training;Neuromuscular re-education;Patient/family education;Manual techniques;Passive range of motion;Dry needling;Taping    PT Next Visit Plan  reviewed/ update HEP PRN. STW along the glute med/ min, gross hip abductor / extensor strengthening, gait training.    PT Home Exercise Plan [email or web address] - SLR, bridge, clamshell, SLR    Consulted and Agree with Plan of Care Patient             Patient will benefit from skilled therapeutic intervention in order to improve the following deficits and impairments:  Improper body mechanics, Increased muscle spasms, Decreased strength, Abnormal gait, Postural dysfunction, Pain, Decreased activity tolerance, Decreased endurance  Visit Diagnosis: Pain in left hip  Chronic left-sided low back pain without sciatica     Problem List 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 06/18/2021   Primary localized osteoarthritis of left hip 01/28/2021   Primary osteoarthritis of left hip 01/28/2021   Primary localized osteoarthritis of right hip 04/09/2020   Primary osteoarthritis of right  hip 04/09/2020   Arthritis of right hip 02/19/2020   Greater trochanteric bursitis of both hips 12/19/2019   Greater trochanteric bursitis of left hip 10/03/2019   Arthritis of left hip 10/03/2019   Left groin pain 03/01/2019   Right shoulder pain 12/27/2018   Chronic bilateral low back pain with right-sided sciatica 01/14/2018   Urinary frequency 01/14/2018   Degenerative arthritis of knee, bilateral 10/21/2017   Arthritis of knee, left 08/03/2017   GERD (gastroesophageal reflux disease) 03/27/2015   Preventative health care 01/04/2015   Constipation 07/15/2012   Allergic rhinitis 04/01/2012   Diabetes mellitus type 2, controlled ([place]) 10/14/2007   Carpal tunnel syndrome 10/14/2007   Dyslipidemia 10/13/2007   Essential hypertension 10/13/2007   Depression, recurrent ([place]) 10/13/2007   [place] PT, DPT, LAT, ATC  07/16/21  5:04 PM     [facility] [address] [place], [place], [address] Phone: [phone]   Fax:  [phone]  Name: Cathy [name] MRN: [id number] Date of Birth: 1946/10/06

## 2021-07-17 ENCOUNTER — Telehealth: Payer: Self-pay | Admitting: Adult Health

## 2021-07-17 ENCOUNTER — Other Ambulatory Visit: Payer: Self-pay | Admitting: Adult Health

## 2021-07-17 NOTE — Telephone Encounter (Signed)
Rx refilled electronically °

## 2021-07-17 NOTE — Telephone Encounter (Signed)
Patient called to get refill on rosuvastatin (CRESTOR) 40 MG tablet. She says she has three tablets left and she needs more       Good callback number [phone]     Please send to  [facility] [id number] [address], [place] [facility] [place] Phone:  [phone]  Fax:  [phone]      Please Advise.

## 2021-07-21 ENCOUNTER — Telehealth: Payer: Self-pay | Admitting: Adult Health

## 2021-07-21 NOTE — Chronic Care Management (AMB) (Signed)
  Chronic Care Management   Outreach Note  07/21/2021 Name: Cathy [name] MRN: [id number] DOB: 04-16-1946  Referred by: [name], NP Reason for referral : No chief complaint on file.   An unsuccessful telephone outreach was attempted today. The patient was referred to the pharmacist for assistance with care management and care coordination.   Follow Up Plan:   Tatjana Dellinger Upstream Scheduler

## 2021-07-23 ENCOUNTER — Other Ambulatory Visit: Payer: Self-pay

## 2021-07-23 ENCOUNTER — Encounter: Payer: Self-pay | Admitting: Physical Therapy

## 2021-07-23 ENCOUNTER — Ambulatory Visit: Payer: Medicare Other | Admitting: Physical Therapy

## 2021-07-23 DIAGNOSIS — G8929 Other chronic pain: Secondary | ICD-10-CM

## 2021-07-23 DIAGNOSIS — M545 Low back pain, unspecified: Secondary | ICD-10-CM | POA: Diagnosis not present

## 2021-07-23 DIAGNOSIS — M25552 Pain in left hip: Secondary | ICD-10-CM

## 2021-07-23 NOTE — Therapy (Signed)
[place], [place], [address] Phone: [phone]   Fax:  [phone]  Physical Therapy Treatment  Patient Details  Name: Cathy [name] MRN: [id number] Date of Birth: 1946/04/10 Referring Provider (PT): Tear of gluteus maximus tendon [phone])   Encounter Date: 07/23/2021   PT End of Session - 07/23/21 1316     Visit Number 2    Number of Visits 17    Date for PT Re-Evaluation 09/10/21    Progress Note Due on Visit 10    PT Start Time 22    PT Stop Time [id number]    PT Time Calculation (min) 53 min             Past Medical History:  Diagnosis Date   Chronic constipation    DJD (degenerative joint disease)    knees   Female cystocele    GERD (gastroesophageal reflux disease)    History of esophagitis    History of left inguinal hernia    History of MRSA infection    recurrent carbuncle   History of recurrent UTIs    Hyperlipidemia    Hypertension    Pre-diabetes    diet controlled   Urgency of urination     Past Surgical History:  Procedure Laterality Date   APPENDECTOMY  1980's   BREAST EXCISIONAL BIOPSY Left 1989   COLONOSCOPY  last one 06-18-2015   INGUINAL HERNIA REPAIR Left 05-10-2001   LAPAROSCOPIC LYSIS OF ADHESIONS  01/17/2019   OOPHORECTOMY Right 01/17/2019   PERINEOPLASTY  01/17/2019   SALPINGECTOMY Left 01/17/2019   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 SACROSPINUS FIXATION, AUGMENTATION WITH AXIS  DERMIS;  Surgeon: Carolan Clines, MD;  Location[facility];  Service: Urology;  Laterality: N/A;    There were no vitals filed for this visit.   Subjective Assessment - 07/23/21 1313     Subjective 7/10 pain. Just a little bit of pain. Doing the exercises every day and they are going good.    Currently in Pain? Yes    Pain Score 7     Pain Location Hip    Pain Orientation Left    Pain Descriptors / Indicators Aching                               [facility] Adult PT Treatment/Exercise - 07/23/21 0001       Lumbar Exercises: Supine   Pelvic Tilt 10 reps    Pelvic Tilt Limitations 2 sets    Clam 10 reps    Clam Limitations with green band - ab draw in    Heel Slides 10 reps    Heel Slides Limitations alternating with PPT    Bent Knee Raise 10 reps    Bent Knee Raise Limitations alternating with PPT    Bridge 10 reps    Bridge Limitations with initial PPT    Straight Leg Raise 10 reps    Straight Leg Raises Limitations with  PPT      Modalities   Modalities Cryotherapy      Cryotherapy   Number Minutes Cryotherapy 10 Minutes    Cryotherapy Location Hip    Type of Cryotherapy Ice pack                      PT Short Term Goals - 07/16/21 1657       PT SHORT TERM GOAL #1   Title pt to be IND with inital HEP    Time 4    Period Weeks    Status New    Target Date 08/13/21      PT SHORT TERM GOAL #2   Title pt to verbalize/ demo efficeint gait pattern utilizing shortened stride with heel strike/ toe off    Time 4    Period Weeks    Status New    Target Date 08/13/21               PT Long Term Goals - 07/16/21 1658       PT LONG TERM GOAL #1   Title increase L hip abductor and extensor  strength to >/=4+/5 to promote hip stability    Time 8    Period Weeks    Status New    Target Date 09/10/21      PT LONG TERM GOAL #2   Title pt to be able to walk/ stand for >/= 60 min demonstrating efficient gait  pattern with no report of pain or limtations    Time 8    Period Weeks    Status New    Target Date 09/10/21      PT LONG TERM GOAL #3   Title increase FOTO score to >/=79% to demo improvement in function    Time 8    Period Weeks    Status New    Target Date 09/10/21      PT LONG TERM GOAL #4   Title pt to be IND with advanced HEP to be able to maintain and progress current LOF IND    Time 8    Period Weeks    Status New    Target Date 09/10/21                   Plan - 07/23/21 1354     Clinical Impression Statement Pt reports she is compliant with HEP and about the same. Reviewed HEP and worked on posterior pelvic tilt to engage abs and gluteals Significant cuing required to complete PPT and maintian with LE exercises. Added PPT to HEP. Attempted S/L clam with increased pain laying on left side. Trial of ice pack to left hip at end of session.    PT Next Visit Plan reviewed/ update HEP PRN. STW along the glute med/ min, gross hip abductor / extensor strengthening, gait training. PPT    PT Home Exercise Plan [email or web address] - SLR, bridge, clamshell, SLR, PPT             Patient will benefit from skilled therapeutic intervention in order to improve the following deficits and impairments:  Improper body mechanics, Increased muscle spasms, Decreased strength, Abnormal gait, Postural dysfunction, Pain, Decreased activity tolerance, Decreased endurance  Visit Diagnosis: Pain in left hip  Chronic left-sided low back pain without sciatica     Problem List Patient Active Problem List   Diagnosis Date Noted   Tear of gluteus maximus tendon 06/27/2021   Chronic hip pain after total  replacement of left hip joint 06/18/2021   Primary localized osteoarthritis of left hip 01/28/2021   Primary osteoarthritis of left hip 01/28/2021   Primary localized osteoarthritis of right hip 04/09/2020   Primary osteoarthritis of right hip 04/09/2020   Arthritis of right hip 02/19/2020    Greater trochanteric bursitis of both hips 12/19/2019   Greater trochanteric bursitis of left hip 10/03/2019   Arthritis of left hip 10/03/2019   Left groin pain 03/01/2019   Right shoulder pain 12/27/2018   Chronic bilateral low back pain with right-sided sciatica 01/14/2018   Urinary frequency 01/14/2018   Degenerative arthritis of knee, bilateral 10/21/2017   Arthritis of knee, left 08/03/2017   GERD (gastroesophageal reflux disease) 03/27/2015   Preventative health care 01/04/2015   Constipation 07/15/2012   Allergic rhinitis 04/01/2012   Diabetes mellitus type 2, controlled ([place]) 10/14/2007   Carpal tunnel syndrome 10/14/2007   Dyslipidemia 10/13/2007   Essential hypertension 10/13/2007   Depression, recurrent ([place]) 10/13/2007    Dorene Ar, PTA 07/23/2021, 1:57 PM  [facility] [address] [place], [place], [address] Phone: [phone]   Fax:  [phone]  Name: Cathy [name] MRN: [id number] Date of Birth: [birth year]

## 2021-07-30 ENCOUNTER — Ambulatory Visit: Payer: Medicare Other | Attending: Family Medicine | Admitting: Physical Therapy

## 2021-07-30 ENCOUNTER — Encounter: Payer: Self-pay | Admitting: Physical Therapy

## 2021-07-30 ENCOUNTER — Other Ambulatory Visit: Payer: Self-pay

## 2021-07-30 DIAGNOSIS — G8929 Other chronic pain: Secondary | ICD-10-CM | POA: Insufficient documentation

## 2021-07-30 DIAGNOSIS — M25552 Pain in left hip: Secondary | ICD-10-CM | POA: Diagnosis not present

## 2021-07-30 DIAGNOSIS — M545 Low back pain, unspecified: Secondary | ICD-10-CM | POA: Insufficient documentation

## 2021-07-30 NOTE — Patient Instructions (Signed)
Access Code: [email or web address] URL: [email or web address] Date: 07/30/2021 Prepared by: Hessie Diener  Exercises Hooklying Clamshell with Resistance - 1 x daily - 7 x weekly - 2 sets - 10 reps Supine Bridge - 1 x daily - 7 x weekly - 2 sets - 10 reps Supine March - 1 x daily - 7 x weekly - 10 reps - 2 sets - 5 hold SLR - 1 x daily - 7 x weekly - 10 reps - 2 sets - 1 hold Pelvic tilt - 1 x daily - 7 x weekly - 2 sets - 10 reps - 5 hold Clamshell - 1 x daily - 7 x weekly - 2 sets - 10 reps Standing Hip Abduction with Counter Support - 1 x daily - 7 x weekly - 2 sets - 10 reps

## 2021-07-30 NOTE — Therapy (Signed)
[place], [place], [address] Phone: [phone]   Fax:  [phone]  Physical Therapy Treatment  Patient Details  Name: Cathy [name] MRN: [id number] Date of Birth: June 17, 1946 Referring Provider (PT): Tear of gluteus maximus tendon [phone])   Encounter Date: 07/30/2021   PT End of Session - 07/30/21 1237     Visit Number 3    Number of Visits 17    Date for PT Re-Evaluation 09/10/21    Authorization Type UHC MCR: Kx mod at 15th visit, FOTO 6th and 10th    Progress Note Due on Visit 10    PT Start Time 1234    PT Stop Time 1312    PT Time Calculation (min) 38 min    Activity Tolerance Patient tolerated treatment well    Behavior During Therapy [facility] for tasks assessed/performed             Past Medical History:  Diagnosis Date   Chronic constipation    DJD (degenerative joint disease)    knees   Female cystocele    GERD (gastroesophageal reflux disease)    History of esophagitis    History of left inguinal hernia    History of MRSA infection    recurrent carbuncle   History of recurrent UTIs    Hyperlipidemia    Hypertension    Pre-diabetes    diet controlled   Urgency of urination     Past Surgical History:  Procedure Laterality Date   APPENDECTOMY  1980's   BREAST EXCISIONAL BIOPSY Left 1989   COLONOSCOPY  last one 06-18-2015   INGUINAL HERNIA REPAIR Left 05-10-2001   LAPAROSCOPIC LYSIS OF ADHESIONS  01/17/2019   OOPHORECTOMY Right 01/17/2019   PERINEOPLASTY  01/17/2019   SALPINGECTOMY Left 01/17/2019   TOTAL HIP ARTHROPLASTY Right 04/09/2020   Procedure: RIGH TOTAL HIP ARTHROPLASTY ANTERIOR APPROACH;  Surgeon: Melrose Nakayama, MD;  Location: [facility];  Service: Orthopedics;  Laterality: Right;   TOTAL HIP ARTHROPLASTY Left 01/28/2021   Procedure: LEFT TOTAL HIP ARTHROPLASTY ANTERIOR APPROACH;  Surgeon: Melrose Nakayama, MD;  Location: [facility];  Service: Orthopedics;  Laterality:  Left;   VAGINAL HYSTERECTOMY  1980's   VAGINAL PROLAPSE REPAIR N/A 03/02/2016   Procedure: COLOPLAST ANTERIOR  VAULT REPAIR WITH AXIS DERMIS. SACROSPINUS FIXATION, AUGMENTATION WITH AXIS DERMIS;  Surgeon: Carolan Clines, MD;  Location: [facility];  Service: Urology;  Laterality: N/A;    There were no vitals filed for this visit.   Subjective Assessment - 07/30/21 1236     Subjective My knee is bothering me today.    Currently in Pain? Yes    Pain Score 5     Pain Location Hip    Pain Orientation Left    Pain Descriptors / Indicators Aching    Pain Type Chronic pain    Pain Relieving Factors water aerobics                               [facility] Adult PT Treatment/Exercise - 07/30/21 0001       Lumbar Exercises: Supine   Pelvic Tilt 10 reps    Clam 20 reps    Clam Limitations with green band - ab draw in    Bridge 10 reps    Bridge Limitations with initial PPT    Straight Leg Raise 10 reps    Straight Leg Raises Limitations with PPT  Knee/Hip Exercises: Stretches   Active Hamstring Stretch Limitations supine with strap x 3 each    Hip Flexor Stretch 2 reps;30 seconds    Hip Flexor Stretch Limitations Left and right, assisted on left - increased tightness on left    Piriformis Stretch Limitations figure 4 push and pull gnetly bilateral    Gastroc Stretch Limitations runners stretch 15 sec x 2 each      Knee/Hip Exercises: Standing   Hip Flexion 10 reps    Hip Flexion Limitations alternating with counter support    Hip Abduction 10 reps    Abduction Limitations bilat at counter -trunk lean when weight bearing on left    Gait Training gait with SPC in clinic to decrease gait deviations- improved step length, heel strike and knee/hip flexion with verbal cues and use of SPC    Other Standing Knee Exercises retro walking along counter x 2 , forward walking along ocunter and in parallel bars with 1 UE support with gait cues      Knee/Hip  Exercises: Supine   Quad Sets 10 reps    Quad Sets Limitations bilateral    Bridges with Clamshell 10 reps   green     Knee/Hip Exercises: Sidelying   Hip ABduction Limitations 10 reps on right, significant trunk compensation on left so discontinued    Clams x 10 each      Cryotherapy   Number Minutes Cryotherapy 10 Minutes    Cryotherapy Location Hip    Type of Cryotherapy Ice pack                  Upper Extremity Functional Index Score :   /80   PT Education - 07/30/21 1320     Education Details HEP    Person(s) Educated Patient    Methods Explanation;Handout    Comprehension Verbalized understanding              PT Short Term Goals - 07/16/21 1657       PT SHORT TERM GOAL #1   Title pt to be IND with inital HEP    Time 4    Period Weeks    Status New    Target Date 08/13/21      PT SHORT TERM GOAL #2   Title pt to verbalize/ demo efficeint gait pattern utilizing shortened stride with heel strike/ toe off    Time 4    Period Weeks    Status New    Target Date 08/13/21               PT Long Term Goals - 07/16/21 1658       PT LONG TERM GOAL #1   Title increase L hip abductor and extensor  strength to >/=4+/5 to promote hip stability    Time 8    Period Weeks    Status New    Target Date 09/10/21      PT LONG TERM GOAL #2   Title pt to be able to walk/ stand for >/= 60 min demonstrating efficient gait pattern with no report of pain or limtations    Time 8    Period Weeks    Status New    Target Date 09/10/21      PT LONG TERM GOAL #3   Title increase FOTO score to >/=79% to demo improvement in function    Time 8    Period Weeks    Status New    Target Date 09/10/21  PT LONG TERM GOAL #4   Title pt to be IND with advanced HEP to be able to maintain and progress current LOF IND    Time 8    Period Weeks    Status New    Target Date 09/10/21                   Plan - 07/30/21 1316     Clinical Impression  Statement Pt reports compliance with HEP and water aerobics. She reports min improvement in hip pain. She continues to ambulate with significant gait deviations. Continued with HIp ROm and strength. Progressed to gait training and used SPC and verbal cues to decrease gait deviations. She was able to demonstrate improved mechanics and was encouraged to practice and be mindful of step length, heel strike and posture with ambulation. Updated HEP with additional hip abduction strengthening. Repeated ice pain to left hip per pt request.    PT Treatment/Interventions ADLs/Self Care Home Management;Cryotherapy;Electrical Stimulation;Iontophoresis '4mg'$ /ml Dexamethasone;Moist Heat;Ultrasound;Gait training;Stair training;Therapeutic activities;Therapeutic exercise;Balance training;Neuromuscular re-education;Patient/family education;Manual techniques;Passive range of motion;Dry needling;Taping    PT Next Visit Plan reviewed/ update HEP PRN. STW along the glute med/ min, gross hip abductor / extensor strengthening, gait training. PPT    PT Home Exercise Plan [email or web address] - SLR, bridge, clamshell,, PPT, side clam, standing hip abduction    Consulted and Agree with Plan of Care Patient             Patient will benefit from skilled therapeutic intervention in order to improve the following deficits and impairments:  Improper body mechanics, Increased muscle spasms, Decreased strength, Abnormal gait, Postural dysfunction, Pain, Decreased activity tolerance, Decreased endurance  Visit Diagnosis: Pain in left hip  Chronic left-sided low back pain without sciatica     Problem List 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 06/18/2021   Primary localized osteoarthritis of left hip 01/28/2021   Primary osteoarthritis of left hip 01/28/2021   Primary localized osteoarthritis of right hip 04/09/2020    Primary osteoarthritis of right hip 04/09/2020   Arthritis of right hip 02/19/2020   Greater trochanteric bursitis of both hips 12/19/2019   Greater trochanteric bursitis of left hip 10/03/2019   Arthritis of left hip 10/03/2019   Left groin pain 03/01/2019   Right shoulder pain 12/27/2018   Chronic bilateral low back pain with right-sided sciatica 01/14/2018   Urinary frequency 01/14/2018   Degenerative arthritis of knee, bilateral 10/21/2017   Arthritis of knee, left 08/03/2017   GERD (gastroesophageal reflux disease) 03/27/2015   Preventative health care 01/04/2015   Constipation 07/15/2012   Allergic rhinitis 04/01/2012   Diabetes mellitus type 2, controlled ([place]) 10/14/2007   Carpal tunnel syndrome 10/14/2007   Dyslipidemia 10/13/2007   Essential hypertension 10/13/2007   Depression, recurrent ([place]) 10/13/2007    Cathy [name], PTA 07/30/2021, 1:20 PM  [facility] [address] [place], [place], [address] Phone: [phone]   Fax:  [phone]  Name: Cathy [name] MRN: [id number] Date of Birth: 01-03-1946

## 2021-08-01 ENCOUNTER — Telehealth: Payer: Self-pay | Admitting: Adult Health

## 2021-08-01 NOTE — Chronic Care Management (AMB) (Signed)
  Chronic Care Management   Outreach Note  08/01/2021 Name: LATISE STOLBERG MRN: [id number] DOB: 09/22/1946  Referred by: [name], NP Reason for referral : No chief complaint on file.   A second unsuccessful telephone outreach was attempted today. The patient was referred to pharmacist for assistance with care management and care coordination.  Follow Up Plan:   Tatjana Dellinger Upstream Scheduler

## 2021-08-05 ENCOUNTER — Other Ambulatory Visit: Payer: Self-pay

## 2021-08-05 ENCOUNTER — Encounter: Payer: Self-pay | Admitting: Physical Therapy

## 2021-08-05 ENCOUNTER — Ambulatory Visit (INDEPENDENT_AMBULATORY_CARE_PROVIDER_SITE_OTHER)
Admission: RE | Admit: 2021-08-05 | Discharge: 2021-08-05 | Disposition: A | Discharge status: Home / Self Care | Payer: Medicare Other | Source: Ambulatory Visit | Attending: Family Medicine | Admitting: Family Medicine

## 2021-08-05 ENCOUNTER — Ambulatory Visit: Payer: Medicare Other | Admitting: Physical Therapy

## 2021-08-05 ENCOUNTER — Encounter: Payer: Medicare Other | Admitting: Physical Therapy

## 2021-08-05 DIAGNOSIS — M5441 Lumbago with sciatica, right side: Secondary | ICD-10-CM

## 2021-08-05 DIAGNOSIS — M545 Low back pain, unspecified: Secondary | ICD-10-CM

## 2021-08-05 DIAGNOSIS — M1611 Unilateral primary osteoarthritis, right hip: Secondary | ICD-10-CM

## 2021-08-05 DIAGNOSIS — M25552 Pain in left hip: Secondary | ICD-10-CM | POA: Diagnosis not present

## 2021-08-05 DIAGNOSIS — G8929 Other chronic pain: Secondary | ICD-10-CM | POA: Diagnosis not present

## 2021-08-05 NOTE — Therapy (Signed)
Cathy [name], [place], [address] Phone: [phone]   Fax:  [phone]  Physical Therapy Treatment  Patient Details  Name: Cathy [name] MRN: [id number] Date of Birth: [birth year] Referring Provider (PT): Tear of gluteus maximus tendon [phone])   Encounter Date: 08/05/2021   PT End of Session - 08/05/21 1152     Visit Number 4    Number of Visits 17    Authorization Type UHC MCR: Kx mod at 15th visit, FOTO 6th and 10th    Progress Note Due on Visit 10    PT Start Time 1148             Past Medical History:  Diagnosis Date   Chronic constipation    DJD (degenerative joint disease)    knees   Female cystocele    GERD (gastroesophageal reflux disease)    History of esophagitis    History of left inguinal hernia    History of MRSA infection    recurrent carbuncle   History of recurrent UTIs    Hyperlipidemia    Hypertension    Pre-diabetes    diet controlled   Urgency of urination     Past Surgical History:  Procedure Laterality Date   APPENDECTOMY  1980's   BREAST EXCISIONAL BIOPSY Left 1989   COLONOSCOPY  last one 06-18-2015   INGUINAL HERNIA REPAIR Left 05-10-2001   LAPAROSCOPIC LYSIS OF ADHESIONS  01/17/2019   OOPHORECTOMY Right 01/17/2019   PERINEOPLASTY  01/17/2019   SALPINGECTOMY Left 01/17/2019   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 SACROSPINUS FIXATION, AUGMENTATION WITH AXIS DERMIS;  Surgeon: Carolan Clines, MD;  Location: [facility];  Service: Urology;  Laterality: N/A;    There were no vitals filed for this visit.   Subjective Assessment - 08/05/21 1152     Subjective " I did more cleaning before my appointment and I have about 6/10 in the L hip."    Diagnostic tests MRI 06/25/2021 IMPRESSION:  No evidence of acute fracture. High-grade partial tearing of the  left gluteus minimus and medius muscles with sub gluteus bursitis.  Mild muscle atrophy suggesting chronic tearing.     Low-grade partial tearing of the right proximal hamstring tendons.     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     Lower lumbar spine degenerative changes with multilevel disc bulging  and varying degrees of spinal canal and neural foraminal narrowing.  This could be further assessed with dedicated lumbar spine MRI if  clinically warranted.    Patient Stated Goals to decrease pain    Currently in Pain? Yes    Pain Score 6     Pain Location Hip    Pain Orientation Left    Pain Descriptors / Indicators Aching    Pain Type Chronic pain    Pain Onset More than a month ago    Pain Frequency Intermittent    Aggravating Factors  cleaning    Pain Relieving Factors water aerobics  [facility] PT Assessment - 08/05/21 0001       Assessment   Medical Diagnosis Cathy Pulley, DO    Referring Provider (PT) Tear of gluteus maximus tendon (S76.019A)                   [facility] Adult PT Treatment/Exercise:  Therapeutic Exercise: L glute med stretch R sidelying 2 x 30 sec Sidelying hip L hip clamshell 2 x 15 Supine marching LLE with GTB around knees 2 x 15 Bridge with sustained clamshell 2 x 10 with GTB Seated isometric ball squeeze 2 x 10 holding 3 sec 1 x 10 with ball between knees to reduce valgus positioning of the knees  Manual Therapy: Skilled palpation and monitoring of pt during TPDN  IASTM along the L glute med  Neuromuscular  re-ed: N/A  Therapeutic Activity: N/A  Self Care N/A  ITEMS NOT PERFORMED TODAY: Quad set Retro walking           Trigger Point Dry Needling - 08/05/21 0001     Consent Given? Yes    Education Handout Provided Yes    Muscles Treated Back/Hip Gluteus minimus;Gluteus medius    Electrical Stimulation Performed with Dry Needling Yes    E-stim with Dry Needling Details CPS Lvl 20 x 8 min adjusting intensity PRN    Gluteus Minimus Response Twitch response elicited;Palpable increased muscle length    Gluteus Medius Response Twitch response elicited;Palpable increased muscle length                   PT Education - 08/05/21 1159     Education Details what TPDN is and benefits    Person(s) Educated Patient    Methods Explanation;Verbal cues;Handout    Comprehension Verbalized understanding;Verbal cues required              PT Short Term Goals - 07/16/21 1657       PT SHORT TERM GOAL #1   Title pt to be IND with inital HEP    Time 4    Period Weeks    Status New    Target Date 08/13/21      PT SHORT TERM GOAL #2   Title pt to verbalize/ demo efficeint gait pattern utilizing shortened stride with heel strike/ toe off    Time 4    Period Weeks    Status New    Target Date 08/13/21               PT Long Term Goals - 07/16/21 1658       PT LONG TERM GOAL #1   Title increase L hip abductor and extensor  strength to >/=4+/5 to promote hip stability    Time 8    Period Weeks    Status New    Target Date 09/10/21      PT LONG TERM GOAL #2   Title pt to be able to walk/ stand for >/= 60 min demonstrating efficient gait pattern with no report of pain or limtations    Time 8    Period Weeks    Status New    Target Date 09/10/21      PT LONG TERM GOAL #3   Title increase FOTO score to >/=79% to demo improvement in function    Time 8    Period Weeks    Status New    Target Date 09/10/21      PT LONG TERM GOAL #4   Title pt to  be IND with  advanced HEP to be able to maintain and progress current LOF IND    Time 8    Period Weeks    Status New    Target Date 09/10/21                   Plan - 08/05/21 1223     Clinical Impression Statement [name] reports consistency with her HEP and reports consistency with her HEP. educated and consent was given for TPDN combined with E-stim focused on the L glute med/ min followed with IASTM techniques. Continued working on hip strengthening focusing on the glute med and additionally adding isometric hip adductor activation to address potential SIJ component. End of session she noted decreased pain inthe hip with standing/ walking.    PT Treatment/Interventions ADLs/Self Care Home Management;Cryotherapy;Electrical Stimulation;Iontophoresis '4mg'$ /ml Dexamethasone;Moist Heat;Ultrasound;Gait training;Stair training;Therapeutic activities;Therapeutic exercise;Balance training;Neuromuscular re-education;Patient/family education;Manual techniques;Passive range of motion;Dry needling;Taping    PT Next Visit Plan reviewed/ update HEP PRN. response to DN for glute med? STW along the glute med/ min, gross hip abductor / extensor strengthening, gait training. PPT    PT Home Exercise Plan [email or web address] - SLR, bridge, clamshell,, PPT, side clam, standing hip abduction    Consulted and Agree with Plan of Care Patient             Patient will benefit from skilled therapeutic intervention in order to improve the following deficits and impairments:  Improper body mechanics, Increased muscle spasms, Decreased strength, Abnormal gait, Postural dysfunction, Pain, Decreased activity tolerance, Decreased endurance  Visit Diagnosis: Pain in left hip  Chronic left-sided low back pain without sciatica     Problem List 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 06/18/2021   Primary localized  osteoarthritis of left hip 01/28/2021   Primary osteoarthritis of left hip 01/28/2021   Primary localized osteoarthritis of right hip 04/09/2020   Primary osteoarthritis of right hip 04/09/2020   Arthritis of right hip 02/19/2020   Greater trochanteric bursitis of both hips 12/19/2019   Greater trochanteric bursitis of left hip 10/03/2019   Arthritis of left hip 10/03/2019   Left groin pain 03/01/2019   Right shoulder pain 12/27/2018   Chronic bilateral low back pain with right-sided sciatica 01/14/2018   Urinary frequency 01/14/2018   Degenerative arthritis of knee, bilateral 10/21/2017   Arthritis of knee, left 08/03/2017   GERD (gastroesophageal reflux disease) 03/27/2015   Preventative health care 01/04/2015   Constipation 07/15/2012   Allergic rhinitis 04/01/2012   Diabetes mellitus type 2, controlled ([place]) 10/14/2007   Carpal tunnel syndrome 10/14/2007   Dyslipidemia 10/13/2007   Essential hypertension 10/13/2007   Depression, recurrent ([place]) 10/13/2007   Starr Lake PT, DPT, LAT, ATC  08/05/21  12:37 PM     [facility] [address] [place], [place], [address] Phone: [phone]   Fax:  [phone]  Name: Cathy [name] MRN: [id number] Date of Birth: 01-Apr-1946

## 2021-08-06 NOTE — Progress Notes (Signed)
[place] [facility] Phone: [phone] Subjective:   Cathy [name], am serving as a scribe for Dr. Hulan Saas. This visit occurred during the SARS-CoV-2 public health emergency.  Safety protocols were in place, including screening questions prior to the visit, additional usage of staff PPE, and extensive cleaning of exam room while observing appropriate contact time as indicated for disinfecting solutions.   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  Given a bilateral knee viscosupplementation injection .  Hopefully that this will help patient aid in some of her recovery with the gluteal tendon tear.  Follow-up again in 6 to 8 weeks  Update 08/12/2021 Cathy [name] is a 75 y.o. female coming in with complaint of B knee, B wrist and L hip pain. Patient has been doing PT. Patient states that her L knee has been more painful recently. Also having L hip pain and L thigh numbness.   Patient did have a bone density exam done that shows the patient is osteopenic but not osteoporotic.  Patient at last exam did have viscosupplementation done of the right knee as well as the left knee on June 27, 2021  Patient also had a right-sided carpal tunnel injection done on the same date.  Patient also had an MRI of the pelvis done.  Patient did have a high-grade partial tearing of the left gluteal minimus.  Partial tearing of the right proximal hamstring noted.  As well as patient does have degenerative changes of the lumbar spine with likely spinal stenosis when comparing to patient's 2020 MRI of the lumbar spine this is still consistent with previous findings from L3-S1.  This was independently visualized by me today.    Past Medical History:  Diagnosis Date   Chronic constipation    DJD (degenerative joint disease)    knees    Female cystocele    GERD (gastroesophageal reflux disease)    History of esophagitis    History of left inguinal hernia    History of MRSA infection    recurrent carbuncle   History of recurrent UTIs    Hyperlipidemia    Hypertension    Pre-diabetes    diet controlled   Urgency of urination    Past Surgical History:  Procedure Laterality Date   APPENDECTOMY  1980's   BREAST EXCISIONAL BIOPSY Left 1989   COLONOSCOPY  last one 06-18-2015   INGUINAL HERNIA REPAIR Left 05-10-2001   LAPAROSCOPIC LYSIS OF ADHESIONS  01/17/2019   OOPHORECTOMY Right 01/17/2019   PERINEOPLASTY  01/17/2019   SALPINGECTOMY Left 01/17/2019   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 SACROSPINUS FIXATION, AUGMENTATION WITH AXIS DERMIS;  Surgeon: Carolan Clines, MD;  Location: [facility];  Service: Urology;  Laterality: N/A;   Social History   Socioeconomic History   Marital status: Single    Spouse name: Not on file   Number of children: Not on  file   Years of education: Not on file   Highest education level: Not on file  Occupational History   Not on file  Tobacco Use   Smoking status: Former    Years: 1.00    Types: Cigarettes    Quit date: 02/28/1996    Years since quitting: 25.4   Smokeless tobacco: Never  Vaping Use   Vaping Use: Never used  Substance and Sexual Activity   Alcohol use: Yes    Alcohol/week: 4.0 standard drinks    Types: 4 Standard drinks or equivalent per week    Comment: on weekends    Drug use: [name]   Sexual activity: Not on file  Other Topics Concern   Not on file  Social History Narrative   Single   Former Smoker  -  quit 10 to 11 years ago (light smoker)   Alcohol use-yes     2 children    Occupation: [employer]    Social Determinants of Health   Financial Resource Strain: Not on file  Food Insecurity: Not on file  Transportation Needs: Not on file  Physical Activity: Not on file  Stress: Not on file  Social Connections: Not on file   Allergies  Allergen Reactions   Penicillins Hives    Has patient had a PCN reaction causing immediate rash, facial/tongue/throat swelling, SOB or lightheadedness with hypotension: Yes Has patient had a PCN reaction causing severe rash involving mucus membranes or skin necrosis: [name] Has patient had a PCN reaction that required hospitalization: [name] Has patient had a PCN reaction occurring within the last 10 years: [name] If all of the above answers are "[name]", then may proceed with Cephalosporin use.    Family History  Problem Relation Age of Onset   Aneurysm Mother 69       deceased secondary to brain aneurysm   Liver cancer Father 44       deceased   Diabetes Other        grandmother   Colon cancer Neg Hx    Stomach cancer Neg Hx    Rectal cancer Neg Hx    Esophageal cancer Neg Hx    Breast cancer Neg Hx     Current Outpatient Medications (Endocrine & Metabolic):    predniSONE (DELTASONE) 20 MG tablet, Take 1 tablet (20 mg total) by mouth daily.  Current Outpatient Medications (Cardiovascular):    lisinopril (ZESTRIL) 10 MG tablet, TAKE 1 TABLET(10 MG) BY MOUTH DAILY   rosuvastatin (CRESTOR) 40 MG tablet, TAKE 1 TABLET(40 MG) BY MOUTH DAILY  Current Outpatient Medications (Respiratory):    fexofenadine (ALLEGRA) 180 MG tablet, Take 180 mg by mouth daily.   fluticasone (FLONASE) 50 MCG/ACT nasal spray, SHAKE LIQUID AND USE 2 SPRAYS IN EACH NOSTRIL DAILY  Current Outpatient Medications (Analgesics):     aspirin 81 MG tablet, Take 1 tablet (81 mg total) by mouth 2 (two) times daily after a meal. (Patient taking differently: Take 81 mg by mouth daily.)   HYDROcodone-acetaminophen (NORCO/VICODIN) 5-325 MG tablet, Take 1-2 tablets by mouth every 6 (six) hours as needed for moderate pain or severe pain (post op pain).  Current Outpatient Medications (Hematological):    Cyanocobalamin (VITAMIN B-12) 2500 MCG SUBL, Place 2,500 mcg under the tongue daily.  Current Outpatient Medications (Other):    Ascorbic Acid (VITAMIN C) 1000 MG tablet, Take 1,000 mg by mouth daily.   b complex vitamins capsule, Take 1 capsule by mouth daily.   Biotin 1000  MCG tablet, Take 1,000 mcg by mouth daily.   Blood Glucose Monitoring Suppl (ONETOUCH VERIO FLEX SYSTEM) w/Device KIT, TEST TWICE DAILY   Calcium Carbonate-Vitamin D 600-400 MG-UNIT tablet, Take 1 tablet by mouth daily.   Chromium 1000 MCG TABS, Take 1,000 mcg by mouth daily.   Cinnamon 500 MG capsule, Take 1,000 mg by mouth daily.   CRANBERRY PO, Take 2 tablets by mouth daily. 4200 mg   cyclobenzaprine (FLEXERIL) 10 MG tablet, Take 1 tablet (10 mg total) by mouth at bedtime.   doxycycline (VIBRAMYCIN) 100 MG capsule, Take 1 capsule (100 mg total) by mouth 2 (two) times daily.   ELDERBERRY PO, Take 1 capsule by mouth daily.   estradiol (ESTRACE) 0.1 MG/GM vaginal cream, Place 1 Applicatorful vaginally every other day. In the evening   gabapentin (NEURONTIN) 100 MG capsule, TAKE 2 CAPSULES(200 MG) BY MOUTH AT BEDTIME   Lancets (ONETOUCH DELICA PLUS [id number]) MISC, USE ONCE DAILY   Misc Natural Products (TART CHERRY ADVANCED) CAPS, Take 2 capsules by mouth daily.   Multiple Vitamins-Minerals (MULTIVITAMIN WITH MINERALS) tablet, Take 1 tablet by mouth daily. Centrum silver   Omega-3 1000 MG CAPS, Take 1,000 mg by mouth daily.   ONETOUCH VERIO test strip, USE TWICE DAILY   OVER THE COUNTER MEDICATION, Take 1 tablet by mouth daily as needed (constipation). Vital  Lax   polyethylene glycol (MIRALAX / GLYCOLAX) packet, Take 17 g by mouth daily as needed for mild constipation.   tiZANidine (ZANAFLEX) 4 MG tablet, Take 1 tablet (4 mg total) by mouth every 6 (six) hours as needed.   traZODone (DESYREL) 50 MG tablet, TAKE 1/2 TO 1 TABLET(25 TO 50 MG) BY MOUTH AT BEDTIME AS NEEDED FOR SLEEP   trimethoprim (TRIMPEX) 100 MG tablet, Take 100 mg by mouth as needed.   Turmeric 500 MG CAPS, Take 1,000 mg by mouth daily.    Reviewed prior external information including notes and imaging from  primary care provider As well as notes that were available from care everywhere and other healthcare systems.  Past medical history, social, surgical and family history all reviewed in electronic medical record.  [name] pertanent information unless stated regarding to the chief complaint.   Review of Systems:  [name] headache, visual changes, nausea, vomiting, diarrhea, constipation, dizziness, abdominal pain, skin rash, fevers, chills, night sweats, weight loss, swollen lymph nodes, joint swelling, chest pain, shortness of breath, mood changes. POSITIVE muscle aches, body aches  Objective  Blood pressure (!) 148/80, pulse 77, height 5' 4"  (1.626 m), weight 143 lb (64.9 kg), SpO2 99 %.   General: [name] apparent distress alert and oriented x3 mood and affect normal, dressed appropriately.  HEENT: Pupils equal, extraocular movements intact  Respiratory: Patient's speak in full sentences and does not appear short of breath  Cardiovascular: [name] lower extremity edema, non tender, [name] erythema  Gait normal with good balance and coordination.  MSK: Patient does have an antalgic gait noted.  Tender to palpation of the greater trochanteric area left greater than right.  Patient's left knee does have some lateral tracking noted.  Lacks last 10 degrees of extension and does have instability of the left knee with valgus and varus force.  Tightness noted with straight leg test and tender to palpation  of the paraspinal musculature of the lumbar spine.  Patient has made some mild improvement following hip abductor strength from previous exam accident    Impression and Recommendations:     The above documentation has been reviewed  and is accurate and complete [name], DO

## 2021-08-08 ENCOUNTER — Telehealth: Payer: Self-pay | Admitting: Adult Health

## 2021-08-08 NOTE — Progress Notes (Signed)
  Chronic Care Management   Outreach Note  08/08/2021 Name: Cathy [name] MRN: [id number] DOB: 11/02/1946  Referred by: [name], NP Reason for referral : No chief complaint on file.   Third unsuccessful telephone outreach was attempted today. The patient was referred to the pharmacist for assistance with care management and care coordination.   Follow Up Plan:   Tatjana Dellinger Upstream Scheduler

## 2021-08-12 ENCOUNTER — Other Ambulatory Visit: Payer: Self-pay

## 2021-08-12 ENCOUNTER — Ambulatory Visit: Payer: Medicare Other | Admitting: Physical Therapy

## 2021-08-12 ENCOUNTER — Encounter: Payer: Self-pay | Admitting: Family Medicine

## 2021-08-12 ENCOUNTER — Ambulatory Visit (INDEPENDENT_AMBULATORY_CARE_PROVIDER_SITE_OTHER): Payer: Medicare Other | Admitting: Family Medicine

## 2021-08-12 ENCOUNTER — Encounter: Payer: Self-pay | Admitting: Physical Therapy

## 2021-08-12 VITALS — BP 148/80 | HR 77 | Ht 64.0 in | Wt 143.0 lb

## 2021-08-12 DIAGNOSIS — M25552 Pain in left hip: Secondary | ICD-10-CM | POA: Diagnosis not present

## 2021-08-12 DIAGNOSIS — G8929 Other chronic pain: Secondary | ICD-10-CM | POA: Diagnosis not present

## 2021-08-12 DIAGNOSIS — M170 Bilateral primary osteoarthritis of knee: Secondary | ICD-10-CM

## 2021-08-12 DIAGNOSIS — M545 Low back pain, unspecified: Secondary | ICD-10-CM | POA: Diagnosis not present

## 2021-08-12 DIAGNOSIS — M5441 Lumbago with sciatica, right side: Secondary | ICD-10-CM | POA: Diagnosis not present

## 2021-08-12 DIAGNOSIS — M5416 Radiculopathy, lumbar region: Secondary | ICD-10-CM | POA: Diagnosis not present

## 2021-08-12 NOTE — Therapy (Signed)
[place], [place], [address] Phone: [phone]   Fax:  [phone]  Physical Therapy Treatment  Patient Details  Name: Cathy [name] MRN: [id number] Date of Birth: 03/05/1946 Referring Provider (PT): Tear of gluteus maximus tendon [phone])   Encounter Date: 08/12/2021   PT End of Session - 08/12/21 1235     Visit Number 5    Number of Visits 17    Date for PT Re-Evaluation 09/10/21    Authorization Type [employer] MCR: Kx mod at 15th visit, FOTO 6th and 10th    Progress Note Due on Visit 10    PT Start Time 83    PT Stop Time 1313    PT Time Calculation (min) 43 min             Past Medical History:  Diagnosis Date   Chronic constipation    DJD (degenerative joint disease)    knees   Female cystocele    GERD (gastroesophageal reflux disease)    History of esophagitis    History of left inguinal hernia    History of MRSA infection    recurrent carbuncle   History of recurrent UTIs    Hyperlipidemia    Hypertension    Pre-diabetes    diet controlled   Urgency of urination     Past Surgical History:  Procedure Laterality Date   APPENDECTOMY  1980's   BREAST EXCISIONAL BIOPSY Left 1989   COLONOSCOPY  last one 06-18-2015   INGUINAL HERNIA REPAIR Left 05-10-2001   LAPAROSCOPIC LYSIS OF ADHESIONS  01/17/2019   OOPHORECTOMY Right 01/17/2019   PERINEOPLASTY  01/17/2019   SALPINGECTOMY Left 01/17/2019   TOTAL HIP ARTHROPLASTY Right 04/09/2020   Procedure: RIGH TOTAL HIP ARTHROPLASTY ANTERIOR APPROACH;  Surgeon: Melrose Nakayama, MD;  Location: [facility];  Service: Orthopedics;  Laterality: Right;   TOTAL HIP ARTHROPLASTY Left 01/28/2021   Procedure: LEFT TOTAL HIP ARTHROPLASTY ANTERIOR APPROACH;  Surgeon: Melrose Nakayama, MD;  Location: [facility];  Service: Orthopedics;  Laterality: Left;   VAGINAL HYSTERECTOMY  1980's   VAGINAL PROLAPSE REPAIR N/A 03/02/2016   Procedure: COLOPLAST ANTERIOR   VAULT REPAIR WITH AXIS DERMIS. SACROSPINUS FIXATION, AUGMENTATION WITH AXIS DERMIS;  Surgeon: Carolan Clines, MD;  Location: [facility];  Service: Urology;  Laterality: N/A;    There were no vitals filed for this visit.   Subjective Assessment - 08/12/21 1233     Subjective I feel a little better somedays. I am walking a little better. I will get an epidural in my back on Tuesday.    Diagnostic tests MRI 06/25/2021 IMPRESSION:  No evidence of acute fracture. High-grade partial tearing of the  left gluteus minimus and medius muscles with sub gluteus bursitis.  Mild muscle atrophy suggesting chronic tearing.     Low-grade partial tearing of the right proximal hamstring tendons.     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     Lower lumbar spine degenerative changes with multilevel disc bulging  and varying degrees of spinal canal and neural foraminal narrowing.  This could be further assessed with dedicated lumbar spine MRI if  clinically warranted.    Currently in Pain? Yes    Pain Score 5     Pain Location Hip    Pain Orientation Left    Pain Descriptors / Indicators Aching    Pain Type Chronic pain    Pain Frequency Intermittent    Aggravating Factors  health aid  duties-preventing fall    Pain Relieving Factors water aerobics                               [facility] Adult PT Treatment/Exercise - 08/12/21 0001       Lumbar Exercises: Supine   Clam 20 reps    Clam Limitations with green band - ab draw in    Bridge 10 reps    Bridge Limitations with initial PPT      Knee/Hip Exercises: Diplomatic Services operational officer Limitations seated hamsting    Piriformis Stretch Limitations figure 4 push and pull gnetly bilateral    Press photographer Limitations runners stretch 15 sec x 2 each      Knee/Hip Exercises: Aerobic   Nustep 5 minutes UE/LE x 4 minutes      Knee/Hip Exercises: Standing   Other Standing Knee Exercises  forward and retro stepping    Other Standing Knee Exercises sit-stand from mat - intermittent use of UE to rise      Knee/Hip Exercises: Supine   Bridges 10 reps      Knee/Hip Exercises: Sidelying   Clams x 10 each green                       PT Short Term Goals - 08/12/21 1235       PT SHORT TERM GOAL #1   Title pt to be IND with inital HEP    Time 4    Period Weeks    Status Achieved    Target Date 08/13/21      PT SHORT TERM GOAL #2   Title pt to verbalize/ demo efficeint gait pattern utilizing shortened stride with heel strike/ toe off    Baseline improving    Time 4    Period Weeks    Status On-going    Target Date 08/13/21               PT Long Term Goals - 07/16/21 1658       PT LONG TERM GOAL #1   Title increase L hip abductor and extensor  strength to >/=4+/5 to promote hip stability    Time 8    Period Weeks    Status New    Target Date 09/10/21      PT LONG TERM GOAL #2   Title pt to be able to walk/ stand for >/= 60 min demonstrating efficient gait pattern with no report of pain or limtations    Time 8    Period Weeks    Status New    Target Date 09/10/21      PT LONG TERM GOAL #3   Title increase FOTO score to >/=79% to demo improvement in function    Time 8    Period Weeks    Status New    Target Date 09/10/21      PT LONG TERM GOAL #4   Title pt to be IND with advanced HEP to be able to maintain and progress current LOF IND    Time 8    Period Weeks    Status New    Target Date 09/10/21                   Plan - 08/12/21 1239     Clinical Impression Statement Pt reports TPDN was helpful until yesterday when she had to lift a patient which  exacerbated her pain. Her gait mechanics are improved today and she reports wearing sneakers which have helped her gait as well. Worked in parallel bars focusing on heel strike, step length, knee flexion and upright posture with gait. She requests to not have more HEP  exercises today.    PT Treatment/Interventions ADLs/Self Care Home Management;Cryotherapy;Electrical Stimulation;Iontophoresis 4mg /ml Dexamethasone;Moist Heat;Ultrasound;Gait training;Stair training;Therapeutic activities;Therapeutic exercise;Balance training;Neuromuscular re-education;Patient/family education;Manual techniques;Passive range of motion;Dry needling;Taping    PT Next Visit Plan FOTO status - reviewed/ update HEP PRN. response to DN for glute med? STW along the glute med/ min, gross hip abductor / extensor strengthening, gait training. PPT    PT Home Exercise Plan [id number] - SLR, bridge, clamshell,, PPT, side clam, standing hip abduction    Consulted and Agree with Plan of Care Patient             Patient will benefit from skilled therapeutic intervention in order to improve the following deficits and impairments:  Improper body mechanics, Increased muscle spasms, Decreased strength, Abnormal gait, Postural dysfunction, Pain, Decreased activity tolerance, Decreased endurance  Visit Diagnosis: Pain in left hip  Chronic left-sided low back pain without sciatica     Problem List 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 06/18/2021   Primary localized osteoarthritis of left hip 01/28/2021   Primary osteoarthritis of left hip 01/28/2021   Primary localized osteoarthritis of right hip 04/09/2020   Primary osteoarthritis of right hip 04/09/2020   Arthritis of right hip 02/19/2020   Greater trochanteric bursitis of both hips 12/19/2019   Greater trochanteric bursitis of left hip 10/03/2019   Arthritis of left hip 10/03/2019   Left groin pain 03/01/2019   Right shoulder pain 12/27/2018   Chronic bilateral low back pain with right-sided sciatica 01/14/2018   Urinary frequency 01/14/2018   Degenerative arthritis of knee, bilateral 10/21/2017   Arthritis of knee,  left 08/03/2017   GERD (gastroesophageal reflux disease) 03/27/2015   Preventative health care 01/04/2015   Constipation 07/15/2012   Allergic rhinitis 04/01/2012   Diabetes mellitus type 2, controlled ([place]) 10/14/2007   Carpal tunnel syndrome 10/14/2007   Dyslipidemia 10/13/2007   Essential hypertension 10/13/2007   Depression, recurrent ([place]) 10/13/2007    Dorene Ar, PTA 08/12/2021, 1:17 PM  [facility] [address] [place], [place], [address] Phone: [phone]   Fax:  [phone]  Name: STINA GANE MRN: [id number] Date of Birth: 1946/07/06

## 2021-08-12 NOTE — Assessment & Plan Note (Signed)
Known arthritic changes of both knees.  Still having difficulty on the left knee.  Patient declined injection.  Concern for potentially lumbar radiculopathy that could be contributing.  We will try epidural with patient's previous MRI showing multiple levels of nerve root impingement and potentially varying degrees of spinal stenosis.  Patient will have that done and follow-up with me again in 4 to 6 weeks.

## 2021-08-12 NOTE — Patient Instructions (Addendum)
[facility] [phone] with you having osteopenia See me again 4 weeks after injection Have great family reunion

## 2021-08-12 NOTE — Assessment & Plan Note (Signed)
Has had radicular symptoms on the right side previously but now having more potential left side.  Patient has had degenerative disc disease and I do feel that further evaluation with epidural for diagnostic as well as therapeutic.  Discussed home exercises.  We will see how patient Epidural follow-up in 4 to 6 weeks

## 2021-08-13 ENCOUNTER — Encounter: Payer: Self-pay | Admitting: Adult Health

## 2021-08-13 ENCOUNTER — Telehealth: Payer: Self-pay | Admitting: Adult Health

## 2021-08-13 ENCOUNTER — Ambulatory Visit (INDEPENDENT_AMBULATORY_CARE_PROVIDER_SITE_OTHER): Payer: Medicare Other | Admitting: Adult Health

## 2021-08-13 VITALS — BP 126/78 | HR 78 | Temp 97.8°F | Ht 63.0 in | Wt 142.2 lb

## 2021-08-13 DIAGNOSIS — Z23 Encounter for immunization: Secondary | ICD-10-CM

## 2021-08-13 DIAGNOSIS — E785 Hyperlipidemia, unspecified: Secondary | ICD-10-CM

## 2021-08-13 DIAGNOSIS — M1990 Unspecified osteoarthritis, unspecified site: Secondary | ICD-10-CM | POA: Diagnosis not present

## 2021-08-13 DIAGNOSIS — E118 Type 2 diabetes mellitus with unspecified complications: Secondary | ICD-10-CM | POA: Diagnosis not present

## 2021-08-13 DIAGNOSIS — Z0000 Encounter for general adult medical examination without abnormal findings: Secondary | ICD-10-CM | POA: Diagnosis not present

## 2021-08-13 DIAGNOSIS — I10 Essential (primary) hypertension: Secondary | ICD-10-CM | POA: Diagnosis not present

## 2021-08-13 LAB — COMPREHENSIVE METABOLIC PANEL
ALT: 13 U/L (ref 0–35)
AST: 20 U/L (ref 0–37)
Albumin: 4.4 g/dL (ref 3.5–5.2)
Alkaline Phosphatase: 69 U/L (ref 39–117)
BUN: 18 mg/dL (ref 6–23)
CO2: 29 mEq/L (ref 19–32)
Calcium: 9.9 mg/dL (ref 8.4–10.5)
Chloride: 101 mEq/L (ref 96–112)
Creatinine, Ser: 0.88 mg/dL (ref 0.40–1.20)
GFR: 65.47 mL/min (ref >60.00)
Glucose, Bld: 95 mg/dL (ref 70–99)
Potassium: 4.2 mEq/L (ref 3.5–5.1)
Sodium: 138 mEq/L (ref 135–145)
Total Bilirubin: 0.5 mg/dL (ref 0.2–1.2)
Total Protein: 6.8 g/dL (ref 6.0–8.3)

## 2021-08-13 LAB — CBC WITH DIFFERENTIAL/PLATELET
Basophils Absolute: 0.1 10*3/uL (ref 0.0–0.1)
Basophils Relative: 0.9 % (ref 0.0–3.0)
Eosinophils Absolute: 0.1 10*3/uL (ref 0.0–0.7)
Eosinophils Relative: 1.6 % (ref 0.0–5.0)
HCT: 41.9 % (ref 36.0–46.0)
Hemoglobin: 13.4 g/dL (ref 12.0–15.0)
Lymphocytes Relative: 23.7 % (ref 12.0–46.0)
Lymphs Abs: 2 10*3/uL (ref 0.7–4.0)
MCHC: 32 g/dL (ref 30.0–36.0)
MCV: 78.9 fl (ref 78.0–100.0)
Monocytes Absolute: 0.6 10*3/uL (ref 0.1–1.0)
Monocytes Relative: 6.6 % (ref 3.0–12.0)
Neutro Abs: 5.6 10*3/uL (ref 1.4–7.7)
Neutrophils Relative %: 67.2 % (ref 43.0–77.0)
Platelets: 285 10*3/uL (ref 150.0–400.0)
RBC: 5.31 Mil/uL — ABNORMAL HIGH (ref 3.87–5.11)
RDW: 15.9 % — ABNORMAL HIGH (ref 11.5–15.5)
WBC: 8.4 10*3/uL (ref 4.0–10.5)

## 2021-08-13 LAB — LIPID PANEL
Cholesterol: 177 mg/dL (ref 0–200)
HDL: 73.3 mg/dL (ref >39.00)
LDL Cholesterol: 86 mg/dL (ref 0–99)
NonHDL: 104.19
Total CHOL/HDL Ratio: 2
Triglycerides: 90 mg/dL (ref 0.0–149.0)
VLDL: 18 mg/dL (ref 0.0–40.0)

## 2021-08-13 LAB — TSH: TSH: 1.41 u[IU]/mL (ref 0.35–5.50)

## 2021-08-13 LAB — HEMOGLOBIN A1C: Hgb A1c MFr Bld: 6.5 % (ref 4.6–6.5)

## 2021-08-13 MED ORDER — CYCLOBENZAPRINE HCL 10 MG PO TABS: 10.0000 mg | ORAL_TABLET | Freq: Every day | ORAL | 0 refills | Status: DC

## 2021-08-13 NOTE — Telephone Encounter (Signed)
Left voice mail detailing her most recent labs.   Advised follow up in 6 months

## 2021-08-13 NOTE — Progress Notes (Signed)
Subjective:    Patient ID: Cathy [name], female    DOB: 12/08/1945, 75 y.o.   MRN: [id number]  HPI Patient presents for yearly preventative medicine examination. She is a pleasant 75 year old female who  has a past medical history of Chronic constipation, DJD (degenerative joint disease), Female cystocele, GERD (gastroesophageal reflux disease), History of esophagitis, History of left inguinal hernia, History of MRSA infection, History of recurrent UTIs, Hyperlipidemia, Hypertension, Pre-diabetes, and Urgency of urination.  DM -currently diet controlled.  She does not monitor her blood sugars at home on a routine basis when she does she reports readings 120 or below.  She denies hypoglycemic events Lab Results  Component Value Date   HGBA1C 6.3 (H) 01/21/2021   Hypertension -takes lisinopril 10 mg daily.  She denies dizziness, lightheadedness, chest pain, shortness of breath BP Readings from Last 3 Encounters:  08/13/21 126/78  08/12/21 (!) 148/80  07/11/21 138/60   Hyperlipidemia -currently prescribed Crestor 40 mg daily.  She denies myalgia or fatigue Lab Results  Component Value Date   CHOL 238 (H) 07/30/2020   HDL 75 07/30/2020   LDLCALC 139 (H) 07/30/2020   LDLDIRECT 118.2 11/21/2013   TRIG 119 07/30/2020   CHOLHDL 3.2 07/30/2020   Chronic Osteoarthritis - history of bilateral total hip replacements in 2021 and 2022. Has chronic pain in both knees and back. Was seen by Sports Medicine yesterday. Plan for epidurals for lumbar radiculopathy. Uses Flexeril 10 mg PRN - needs refill    All immunizations and health maintenance protocols were reviewed with the patient and needed orders were placed.  Appropriate screening laboratory values were ordered for the patient including screening of hyperlipidemia, renal function and hepatic function. If indicated by BPH, a PSA was ordered.  Medication reconciliation,  past medical history, social history, problem list and allergies  were reviewed in detail with the patient  Goals were established with regard to weight loss, exercise, and  diet in compliance with medications Wt Readings from Last 3 Encounters:  08/13/21 142 lb 3.2 oz (64.5 kg)  08/12/21 143 lb (64.9 kg)  07/11/21 143 lb (64.9 kg)   She is up-to-date on routine colon cancer screening as well as mammograms.  Review of Systems  Constitutional: Negative.   HENT: Negative.    Eyes: Negative.   Respiratory: Negative.    Cardiovascular: Negative.   Gastrointestinal: Negative.   Endocrine: Negative.   Genitourinary: Negative.   Musculoskeletal:  Positive for arthralgias.  Skin: Negative.   Allergic/Immunologic: Negative.   Neurological: Negative.   Hematological: Negative.   Psychiatric/Behavioral: Negative.     Past Medical History:  Diagnosis Date   Chronic constipation    DJD (degenerative joint disease)    knees   Female cystocele    GERD (gastroesophageal reflux disease)    History of esophagitis    History of left inguinal hernia    History of MRSA infection    recurrent carbuncle   History of recurrent UTIs    Hyperlipidemia    Hypertension    Pre-diabetes    diet controlled   Urgency of urination     Social History   Socioeconomic History   Marital status: Single    Spouse name: Not on file   Number of children: Not on file   Years of education: Not on file   Highest education level: Not on file  Occupational History   Not on file  Tobacco Use   Smoking status: Former  Years: 1.00    Types: Cigarettes    Quit date: 02/28/1996    Years since quitting: 25.4   Smokeless tobacco: Never  Vaping Use   Vaping Use: Never used  Substance and Sexual Activity   Alcohol use: Yes    Alcohol/week: 4.0 standard drinks    Types: 4 Standard drinks or equivalent per week    Comment: on weekends   Drug use: No   Sexual activity: Not on file  Other Topics Concern   Not on file  Social History Narrative   Single   Former  Smoker  -  quit 10 to 11 years ago (light smoker)   Alcohol use-yes     2 children    Occupation: [employer]    Social Determinants of Health   Financial Resource Strain: Not on file  Food Insecurity: Not on file  Transportation Needs: Not on file  Physical Activity: Not on file  Stress: Not on file  Social Connections: Not on file  Intimate Partner Violence: Not on file    Past Surgical History:  Procedure Laterality Date   APPENDECTOMY  1980's   BREAST EXCISIONAL BIOPSY Left 1989   COLONOSCOPY  last one 06-18-2015   INGUINAL HERNIA REPAIR Left 05-10-2001   LAPAROSCOPIC LYSIS OF ADHESIONS  01/17/2019   OOPHORECTOMY Right 01/17/2019   PERINEOPLASTY  01/17/2019   SALPINGECTOMY Left 01/17/2019   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 SACROSPINUS FIXATION, AUGMENTATION WITH AXIS DERMIS;  Surgeon: Carolan Clines, MD;  Location: [facility];  Service: Urology;  Laterality: N/A;    Family History  Problem Relation Age of Onset   Aneurysm Mother 11       deceased secondary to brain aneurysm   Liver cancer Father 10       deceased   Diabetes Other        grandmother   Colon cancer Neg Hx    Stomach cancer Neg Hx    Rectal cancer Neg Hx    Esophageal cancer Neg Hx    Breast cancer Neg Hx     Allergies  Allergen Reactions   Penicillins Hives    Has patient had a PCN reaction causing immediate rash, facial/tongue/throat swelling, SOB or lightheadedness with hypotension:  Yes Has patient had a PCN reaction causing severe rash involving mucus membranes or skin necrosis: No Has patient had a PCN reaction that required hospitalization: No Has patient had a PCN reaction occurring within the last 10 years: No If all of the above answers are "NO", then may proceed with Cephalosporin use.     Current Outpatient Medications on File Prior to Visit  Medication Sig Dispense Refill   Ascorbic Acid (VITAMIN C) 1000 MG tablet Take 1,000 mg by mouth daily.     aspirin 81 MG tablet Take 1 tablet (81 mg total) by mouth 2 (two) times daily after a meal. (Patient taking differently: Take 81 mg by mouth daily.) 60 tablet 0   b complex vitamins capsule Take 1 capsule by mouth  daily.     Biotin 1000 MCG tablet Take 1,000 mcg by mouth daily.     Blood Glucose Monitoring Suppl (ONETOUCH VERIO FLEX SYSTEM) w/Device KIT TEST TWICE DAILY 1 kit 0   Calcium Carbonate-Vitamin D 600-400 MG-UNIT tablet Take 1 tablet by mouth daily.     Chromium 1000 MCG TABS Take 1,000 mcg by mouth daily.     Cinnamon 500 MG capsule Take 1,000 mg by mouth daily.     CRANBERRY PO Take 2 tablets by mouth daily. 4200 mg     Cyanocobalamin (VITAMIN B-12) 2500 MCG SUBL Place 2,500 mcg under the tongue daily.     cyclobenzaprine (FLEXERIL) 10 MG tablet Take 1 tablet (10 mg total) by mouth at bedtime. 5 tablet 0   ELDERBERRY PO Take 1 capsule by mouth daily.     estradiol (ESTRACE) 0.1 MG/GM vaginal cream Place 1 Applicatorful vaginally every other day. In the evening  3   fexofenadine (ALLEGRA) 180 MG tablet Take 180 mg by mouth daily.     fluticasone (FLONASE) 50 MCG/ACT nasal spray SHAKE LIQUID AND USE 2 SPRAYS IN EACH NOSTRIL DAILY 48 g 2   Lancets (ONETOUCH DELICA PLUS [id number]) MISC USE ONCE DAILY 100 each 3   lisinopril (ZESTRIL) 10 MG tablet TAKE 1 TABLET(10 MG) BY MOUTH DAILY 90 tablet 3   Misc Natural Products (TART CHERRY ADVANCED) CAPS Take 2 capsules by mouth daily.     Multiple Vitamins-Minerals  (MULTIVITAMIN WITH MINERALS) tablet Take 1 tablet by mouth daily. Centrum silver     Omega-3 1000 MG CAPS Take 1,000 mg by mouth daily.     ONETOUCH VERIO test strip USE TWICE DAILY 200 strip 3   OVER THE COUNTER MEDICATION Take 1 tablet by mouth daily as needed (constipation). Vital Lax     polyethylene glycol (MIRALAX / GLYCOLAX) packet Take 17 g by mouth daily as needed for mild constipation.     rosuvastatin (CRESTOR) 40 MG tablet TAKE 1 TABLET(40 MG) BY MOUTH DAILY 90 tablet 1   Turmeric 500 MG CAPS Take 1,000 mg by mouth daily.      gabapentin (NEURONTIN) 100 MG capsule TAKE 2 CAPSULES(200 MG) BY MOUTH AT BEDTIME (Patient not taking: Reported on 08/13/2021) 180 capsule 0   HYDROcodone-acetaminophen (NORCO/VICODIN) 5-325 MG tablet Take 1-2 tablets by mouth every 6 (six) hours as needed for moderate pain or severe pain (post op pain). (Patient not taking: Reported on 08/13/2021) 30 tablet 0   tiZANidine (ZANAFLEX) 4 MG tablet Take 1 tablet (4 mg total) by mouth every 6 (six) hours as needed. (Patient not taking: Reported on 08/13/2021) 40 tablet 1   traZODone (DESYREL) 50 MG tablet TAKE 1/2 TO 1 TABLET(25 TO 50 MG) BY MOUTH AT BEDTIME AS NEEDED FOR SLEEP (Patient not taking: Reported on 08/13/2021) 30 tablet 3   trimethoprim (TRIMPEX) 100 MG tablet Take 100 mg by mouth as needed. (Patient not taking: Reported on 08/13/2021)     [DISCONTINUED] valsartan (DIOVAN) 160 MG tablet Take 1/2 tablet by mouth once daily 30 tablet 5   No current facility-administered medications on file prior to visit.    BP 126/78 (BP Location: Left Arm, Patient Position: Sitting, Cuff Size: Normal)   Pulse 78   Temp 97.8 F (36.6 C) (Oral)   Ht _0  (1.6 m)   Wt 142 lb 3.2 oz (64.5 kg)   SpO2 97%   BMI 25.19 kg/m        Objective:   Physical Exam  Vitals and nursing note reviewed.  Constitutional:      General: She is not in acute distress.    Appearance: Normal appearance. She is well-developed. She is not  ill-appearing.  HENT:     Head: Normocephalic and atraumatic.     Right Ear: Tympanic membrane, ear canal and external ear normal. There is no impacted cerumen.     Left Ear: Tympanic membrane, ear canal and external ear normal. There is no impacted cerumen.     Nose: Nose normal. No congestion or rhinorrhea.     Mouth/Throat:     Mouth: Mucous membranes are moist.     Pharynx: Oropharynx is clear. No oropharyngeal exudate or posterior oropharyngeal erythema.  Eyes:     General:        Right eye: No discharge.        Left eye: No discharge.     Extraocular Movements: Extraocular movements intact.     Conjunctiva/sclera: Conjunctivae normal.     Pupils: Pupils are equal, round, and reactive to light.  Neck:     Thyroid: No thyromegaly.     Vascular: No carotid bruit.     Trachea: No tracheal deviation.  Cardiovascular:     Rate and Rhythm: Normal rate and regular rhythm.     Pulses: Normal pulses.     Heart sounds: Normal heart sounds. No murmur heard.   No friction rub. No gallop.  Pulmonary:     Effort: Pulmonary effort is normal. No respiratory distress.     Breath sounds: Normal breath sounds. No stridor. No wheezing, rhonchi or rales.  Chest:     Chest wall: No tenderness.  Abdominal:     General: Abdomen is flat. Bowel sounds are normal. There is no distension.     Palpations: Abdomen is soft. There is no mass.     Tenderness: There is no abdominal tenderness. There is no right CVA tenderness, left CVA tenderness, guarding or rebound.     Hernia: No hernia is present.  Musculoskeletal:        General: No swelling, tenderness, deformity or signs of injury. Normal range of motion.     Cervical back: Normal range of motion and neck supple.     Right lower leg: No edema.     Left lower leg: No edema.  Lymphadenopathy:     Cervical: No cervical adenopathy.  Skin:    General: Skin is warm and dry.     Coloration: Skin is not jaundiced or pale.     Findings: No bruising,  erythema, lesion or rash.  Neurological:     General: No focal deficit present.     Mental Status: She is alert and oriented to person, place, and time.     Cranial Nerves: No cranial nerve deficit.     Sensory: No sensory deficit.     Motor: No weakness.     Coordination: Coordination normal.     Gait: Gait normal.     Deep Tendon Reflexes: Reflexes normal.  Psychiatric:        Mood and Affect: Mood normal.        Behavior: Behavior normal.        Thought Content: Thought content normal.        Judgment: Judgment normal.      Assessment & Plan:  1. Routine general medical examination at a health care facility - Follow up in one year or sooner if needed - Continue to stay active and eat healthy  -  Flu shot given today  - CBC with Differential/Platelet; Future - Comprehensive metabolic panel; Future - Hemoglobin A1c; Future - Lipid panel; Future - TSH; Future  2. Controlled type 2 diabetes mellitus with complication, without long-term current use of insulin ([place]) - Consider adding Metformin  - CBC with Differential/Platelet; Future - Comprehensive metabolic panel; Future - Hemoglobin A1c; Future - Lipid panel; Future - TSH; Future  3. Primary hypertension - Controlled. No change  - CBC with Differential/Platelet; Future - Comprehensive metabolic panel; Future - Hemoglobin A1c; Future - Lipid panel; Future - TSH; Future  4. Dyslipidemia - Consider increase in statin  - CBC with Differential/Platelet; Future - Comprehensive metabolic panel; Future - Hemoglobin A1c; Future - Lipid panel; Future - TSH; Future  5. Chronic Osteoarthritis   - cyclobenzaprine (FLEXERIL) 10 MG tablet; Take 1 tablet (10 mg total) by mouth at bedtime.  Dispense: 30 tablet; Refill: 0  [name], NP

## 2021-08-13 NOTE — Addendum Note (Signed)
Addended by: Elmer Picker on: 08/13/2021 07:34 AM   Modules accepted: Orders

## 2021-08-13 NOTE — Patient Instructions (Signed)
It was great seeing you today   I will follow up with you regarding your blood work   Please let me know if you need anything

## 2021-08-14 ENCOUNTER — Encounter: Payer: Self-pay | Admitting: Physical Therapy

## 2021-08-14 ENCOUNTER — Other Ambulatory Visit: Payer: Self-pay

## 2021-08-14 ENCOUNTER — Ambulatory Visit: Payer: Medicare Other | Admitting: Physical Therapy

## 2021-08-14 DIAGNOSIS — M545 Low back pain, unspecified: Secondary | ICD-10-CM | POA: Diagnosis not present

## 2021-08-14 DIAGNOSIS — G8929 Other chronic pain: Secondary | ICD-10-CM | POA: Diagnosis not present

## 2021-08-14 DIAGNOSIS — M25552 Pain in left hip: Secondary | ICD-10-CM

## 2021-08-14 NOTE — Therapy (Signed)
[facility] [place], [place], [address] Phone: [phone]   Fax:  [phone]  Physical Therapy Treatment  Patient Details  Name: Cathy [name] MRN: [id number] Date of Birth: 1946/08/08 Referring Provider (PT): Tear of gluteus maximus tendon [phone])   Encounter Date: 08/14/2021   PT End of Session - 08/14/21 1321     Visit Number 6    Number of Visits 17    Date for PT Re-Evaluation 09/10/21    Authorization Type [employer] MCR: Kx mod at 15th visit, FOTO 6th and 10th    Progress Note Due on Visit 10    PT Start Time 1317    PT Stop Time 1400    PT Time Calculation (min) 43 min             Past Medical History:  Diagnosis Date   Chronic constipation    DJD (degenerative joint disease)    knees   Female cystocele    GERD (gastroesophageal reflux disease)    History of esophagitis    History of left inguinal hernia    History of MRSA infection    recurrent carbuncle   History of recurrent UTIs    Hyperlipidemia    Hypertension    Pre-diabetes    diet controlled   Urgency of urination     Past Surgical History:  Procedure Laterality Date   APPENDECTOMY  1980's   BREAST EXCISIONAL BIOPSY Left 1989   COLONOSCOPY  last one 06-18-2015   INGUINAL HERNIA REPAIR Left 05-10-2001   LAPAROSCOPIC LYSIS OF ADHESIONS  01/17/2019   OOPHORECTOMY Right 01/17/2019   PERINEOPLASTY  01/17/2019   SALPINGECTOMY Left 01/17/2019   TOTAL HIP ARTHROPLASTY Right 04/09/2020   Procedure: RIGH TOTAL HIP ARTHROPLASTY ANTERIOR APPROACH;  Surgeon: Melrose Nakayama, MD;  Location: [facility];  Service: Orthopedics;  Laterality: Right;   TOTAL HIP ARTHROPLASTY Left 01/28/2021   Procedure: LEFT TOTAL HIP ARTHROPLASTY ANTERIOR APPROACH;  Surgeon: Melrose Nakayama, MD;  Location: [facility];  Service: Orthopedics;  Laterality: Left;   VAGINAL HYSTERECTOMY  1980's   VAGINAL PROLAPSE REPAIR N/A 03/02/2016   Procedure: COLOPLAST ANTERIOR   VAULT REPAIR WITH AXIS DERMIS. SACROSPINUS FIXATION, AUGMENTATION WITH AXIS DERMIS;  Surgeon: Carolan Clines, MD;  Location: [facility];  Service: Urology;  Laterality: N/A;    There were no vitals filed for this visit.   Subjective Assessment - 08/14/21 1319     Subjective My hip was hurting yesterday but is better today. I need some exercises for my knee. It is hurting a little today.    Diagnostic tests MRI 06/25/2021 IMPRESSION:  No evidence of acute fracture. High-grade partial tearing of the  left gluteus minimus and medius muscles with sub gluteus bursitis.  Mild muscle atrophy suggesting chronic tearing.     Low-grade partial tearing of the right proximal hamstring tendons.     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     Lower lumbar spine degenerative changes with multilevel disc bulging  and varying degrees of spinal canal and neural foraminal narrowing.  This could be further assessed with dedicated lumbar spine MRI if  clinically warranted.    Currently in Pain? Yes    Pain Score 0-No pain    Pain Location Hip                [facility] PT Assessment - 08/14/21 0001       Observation/Other Assessments   Focus on Therapeutic Outcomes (  FOTO)  63%                           [facility] Adult PT Treatment/Exercise - 08/14/21 0001       Lumbar Exercises: Supine   Pelvic Tilt 10 reps    Clam 20 reps    Clam Limitations with green band - ab draw in      Knee/Hip Exercises: Stretches   Active Hamstring Stretch Limitations supine with strap x 3 each    Hip Flexor Stretch 2 reps;Left    Piriformis Stretch Limitations figure 4 push and pull gnetly bilateral    Gastroc Stretch Limitations runners stretch 15 sec x 2 each      Knee/Hip Exercises: Aerobic   Nustep 5 minutes UE/LE x 4 minutes      Knee/Hip Exercises: Supine   Short Arc [employer] 15 reps    Short Arc Quad Sets Limitations left    Bridges 15 reps     Bridges with Clamshell 10 reps   green   Straight Leg Raises 10 reps    Straight Leg Raises Limitations with initial quad set      Knee/Hip Exercises: Sidelying   Clams x 10 each                       PT Short Term Goals - 08/12/21 1235       PT SHORT TERM GOAL #1   Title pt to be IND with inital HEP    Time 4    Period Weeks    Status Achieved    Target Date 08/13/21      PT SHORT TERM GOAL #2   Title pt to verbalize/ demo efficeint gait pattern utilizing shortened stride with heel strike/ toe off    Baseline improving    Time 4    Period Weeks    Status On-going    Target Date 08/13/21               PT Long Term Goals - 07/16/21 1658       PT LONG TERM GOAL #1   Title increase L hip abductor and extensor  strength to >/=4+/5 to promote hip stability    Time 8    Period Weeks    Status New    Target Date 09/10/21      PT LONG TERM GOAL #2   Title pt to be able to walk/ stand for >/= 60 min demonstrating efficient gait pattern with no report of pain or limtations    Time 8    Period Weeks    Status New    Target Date 09/10/21      PT LONG TERM GOAL #3   Title increase FOTO score to >/=79% to demo improvement in function    Time 8    Period Weeks    Status New    Target Date 09/10/21      PT LONG TERM GOAL #4   Title pt to be IND with advanced HEP to be able to maintain and progress current LOF IND    Time 8    Period Weeks    Status New    Target Date 09/10/21                   Plan - 08/14/21 1348     Clinical Impression Statement Pt arrives voicing frustration with the condition of her left  knee. She reports she is also having intermittent pain in the left knee which has worsened since her left hip replacement. Pts FOTO function scored decreased compared to initial score. Continued with gait , core and LE strength. Reviewed stretches that are beneficial for both knee and hip. No increased pain with session.    PT  Treatment/Interventions ADLs/Self Care Home Management;Cryotherapy;Electrical Stimulation;Iontophoresis 4mg /ml Dexamethasone;Moist Heat;Ultrasound;Gait training;Stair training;Therapeutic activities;Therapeutic exercise;Balance training;Neuromuscular re-education;Patient/family education;Manual techniques;Passive range of motion;Dry needling;Taping    PT Next Visit Plan reviewed/ update HEP PRN. response to DN for glute med? STW along the glute med/ min, gross hip abductor / extensor strengthening, gait training. PPT    PT Home Exercise Plan [id number] - SLR, bridge, clamshell,, PPT, side clam, standing hip abduction    Consulted and Agree with Plan of Care Patient             Patient will benefit from skilled therapeutic intervention in order to improve the following deficits and impairments:  Improper body mechanics, Increased muscle spasms, Decreased strength, Abnormal gait, Postural dysfunction, Pain, Decreased activity tolerance, Decreased endurance  Visit Diagnosis: Pain in left hip  Chronic left-sided low back pain without sciatica     Problem List 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 06/18/2021   Primary localized osteoarthritis of left hip 01/28/2021   Primary osteoarthritis of left hip 01/28/2021   Primary localized osteoarthritis of right hip 04/09/2020   Primary osteoarthritis of right hip 04/09/2020   Arthritis of right hip 02/19/2020   Greater trochanteric bursitis of both hips 12/19/2019   Greater trochanteric bursitis of left hip 10/03/2019   Arthritis of left hip 10/03/2019   Left groin pain 03/01/2019   Right shoulder pain 12/27/2018   Chronic bilateral low back pain with right-sided sciatica 01/14/2018   Urinary frequency 01/14/2018   Degenerative arthritis of knee, bilateral 10/21/2017   Arthritis of knee, left 08/03/2017   GERD  (gastroesophageal reflux disease) 03/27/2015   Preventative health care 01/04/2015   Constipation 07/15/2012   Allergic rhinitis 04/01/2012   Diabetes mellitus type 2, controlled ([place]) 10/14/2007   Carpal tunnel syndrome 10/14/2007   Dyslipidemia 10/13/2007   Essential hypertension 10/13/2007   Depression, recurrent ([place]) 10/13/2007    Dorene Ar, PTA 08/14/2021, 2:10 PM  [facility] [address] [place], [place], [address] Phone: [phone]   Fax:  [phone]  Name: ALLYCIA PITZ MRN: [id number] Date of Birth: [birth year]

## 2021-08-19 ENCOUNTER — Ambulatory Visit
Admission: RE | Admit: 2021-08-19 | Discharge: 2021-08-19 | Disposition: A | Discharge status: Home / Self Care | Payer: Medicare Other | Source: Ambulatory Visit | Attending: Family Medicine | Admitting: Family Medicine

## 2021-08-19 ENCOUNTER — Other Ambulatory Visit: Payer: Self-pay | Admitting: Family Medicine

## 2021-08-19 DIAGNOSIS — M5416 Radiculopathy, lumbar region: Secondary | ICD-10-CM

## 2021-08-19 MED ORDER — IOPAMIDOL (ISOVUE-M 200) INJECTION 41%: 1.0000 mL | Freq: Once | INTRAMUSCULAR | Status: DC

## 2021-08-19 MED ORDER — METHYLPREDNISOLONE ACETATE 40 MG/ML INJ SUSP (RADIOLOG: 80.0000 mg | Freq: Once | INTRAMUSCULAR | Status: DC

## 2021-08-19 NOTE — Discharge Instructions (Signed)

## 2021-08-20 ENCOUNTER — Other Ambulatory Visit: Payer: Self-pay

## 2021-08-20 ENCOUNTER — Ambulatory Visit: Payer: Medicare Other | Admitting: Physical Therapy

## 2021-08-20 ENCOUNTER — Other Ambulatory Visit: Payer: Self-pay | Admitting: Neurosurgery

## 2021-08-20 DIAGNOSIS — M545 Low back pain, unspecified: Secondary | ICD-10-CM

## 2021-08-20 DIAGNOSIS — M25552 Pain in left hip: Secondary | ICD-10-CM | POA: Diagnosis not present

## 2021-08-20 DIAGNOSIS — G8929 Other chronic pain: Secondary | ICD-10-CM

## 2021-08-20 NOTE — Therapy (Signed)
[place], [place], [address] Phone: [phone]   Fax:  [phone]  Physical Therapy Treatment  Patient Details  Name: Cathy [name] MRN: [id number] Date of Birth: 1946/03/18 Referring Provider (PT): Tear of gluteus maximus tendon [phone])   Encounter Date: 08/20/2021   PT End of Session - 08/20/21 0810     Visit Number 7    Number of Visits 17    Date for PT Re-Evaluation 09/10/21    Authorization Type UHC MCR: Kx mod at 15th visit, FOTO 6th and 10th    Progress Note Due on Visit 10    PT Start Time 0807    PT Stop Time 0845    PT Time Calculation (min) 38 min             Past Medical History:  Diagnosis Date   Chronic constipation    DJD (degenerative joint disease)    knees   Female cystocele    GERD (gastroesophageal reflux disease)    History of esophagitis    History of left inguinal hernia    History of MRSA infection    recurrent carbuncle   History of recurrent UTIs    Hyperlipidemia    Hypertension    Pre-diabetes    diet controlled   Urgency of urination     Past Surgical History:  Procedure Laterality Date   APPENDECTOMY  1980's   BREAST EXCISIONAL BIOPSY Left 1989   COLONOSCOPY  last one 06-18-2015   INGUINAL HERNIA REPAIR Left 05-10-2001   LAPAROSCOPIC LYSIS OF ADHESIONS  01/17/2019   OOPHORECTOMY Right 01/17/2019   PERINEOPLASTY  01/17/2019   SALPINGECTOMY Left 01/17/2019   TOTAL HIP ARTHROPLASTY Right 04/09/2020   Procedure: RIGH TOTAL HIP ARTHROPLASTY ANTERIOR APPROACH;  Surgeon: Melrose Nakayama, MD;  Location: [facility];  Service: Orthopedics;  Laterality: Right;   TOTAL HIP ARTHROPLASTY Left 01/28/2021   Procedure: LEFT TOTAL HIP ARTHROPLASTY ANTERIOR APPROACH;  Surgeon: Melrose Nakayama, MD;  Location: [facility];  Service: Orthopedics;  Laterality: Left;   VAGINAL HYSTERECTOMY  1980's   VAGINAL PROLAPSE REPAIR N/A 03/02/2016   Procedure: COLOPLAST ANTERIOR   VAULT REPAIR WITH AXIS DERMIS. SACROSPINUS FIXATION, AUGMENTATION WITH AXIS DERMIS;  Surgeon: Carolan Clines, MD;  Location: [facility];  Service: Urology;  Laterality: N/A;    There were no vitals filed for this visit.   Subjective Assessment - 08/20/21 0809     Subjective The hip is not hurting today. My back injections will be Monday.    Diagnostic tests MRI 06/25/2021 IMPRESSION:  No evidence of acute fracture. High-grade partial tearing of the  left gluteus minimus and medius muscles with sub gluteus bursitis.  Mild muscle atrophy suggesting chronic tearing.     Low-grade partial tearing of the right proximal hamstring tendons.     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     Lower lumbar spine degenerative changes with multilevel disc bulging  and varying degrees of spinal canal and neural foraminal narrowing.  This could be further assessed with dedicated lumbar spine MRI if  clinically warranted.    Currently in Pain? No/denies    Pain Score 0-No pain    Pain Location Hip                               [facility] Adult PT Treatment/Exercise - 08/20/21 0001       Lumbar Exercises:  Supine   Pelvic Tilt 10 reps    Clam 20 reps    Clam Limitations blue band cues for eccentric control    Bridge 10 reps    Bridge Limitations with initial PPT    Bridge with clamshell 10 reps   with blue band     Knee/Hip Exercises: Stretches   Active Hamstring Stretch Limitations supine with strap x 3 each    Hip Flexor Stretch 2 reps;Left    Piriformis Stretch Limitations figure 4 push and pull gnetly bilateral    Gastroc Stretch Limitations runners stretch 15 sec x 2 each    Other Knee/Hip Stretches lower trunk rotations x 10 with 10 sec holds      Knee/Hip Exercises: Aerobic   Nustep 5 minutes UE/LE x 4 minutes      Knee/Hip Exercises: Standing   Hip Flexion 10 reps    Hip Flexion Limitations alternating with counter support     Hip Abduction 10 reps    Abduction Limitations bilat at counter    SLS 5 sec best    Gait Training gait with cues for heel strike and step length    Other Standing Knee Exercises tandem stance 12 sec best      Knee/Hip Exercises: Supine   Quad Sets 10 reps    Quad Sets Limitations bilateral    Short Arc [employer] 15 reps    Short Arc Quad Sets Limitations alternating    Bridges --    Bridges with Clamshell 10 reps   green   Straight Leg Raises 10 reps    Straight Leg Raises Limitations with initial quad set      Knee/Hip Exercises: Sidelying   Clams x 10 eachx 2                       PT Short Term Goals - 08/20/21 1610       PT SHORT TERM GOAL #1   Title pt to be IND with inital HEP    Time 4    Period Weeks    Status Achieved    Target Date 08/13/21      PT SHORT TERM GOAL #2   Title pt to verbalize/ demo efficeint gait pattern utilizing shortened stride with heel strike/ toe off    Baseline improving    Time 4    Period Weeks    Status On-going               PT Long Term Goals - 07/16/21 1658       PT LONG TERM GOAL #1   Title increase L hip abductor and extensor  strength to >/=4+/5 to promote hip stability    Time 8    Period Weeks    Status New    Target Date 09/10/21      PT LONG TERM GOAL #2   Title pt to be able to walk/ stand for >/= 60 min demonstrating efficient gait pattern with no report of pain or limtations    Time 8    Period Weeks    Status New    Target Date 09/10/21      PT LONG TERM GOAL #3   Title increase FOTO score to >/=79% to demo improvement in function    Time 8    Period Weeks    Status New    Target Date 09/10/21      PT LONG TERM GOAL #4   Title pt  to be IND with advanced HEP to be able to maintain and progress current LOF IND    Time 8    Period Weeks    Status New    Target Date 09/10/21                   Plan - 08/20/21 0824     Clinical Impression Statement Pt reports no pain on  arrival. She is wearing soft knee brace on left which she reports is helpful for her knee pain. Her step length and heel stike have improved with gait. Continued with Core and LE strength without increased pain.    PT Next Visit Plan reviewed/ update HEP PRN. response to DN for glute med? STW along the glute med/ min, gross hip abductor / extensor strengthening, gait training. PPT    PT Home Exercise Plan [email or web address] - SLR, bridge, clamshell,, PPT, side clam, standing hip abduction    Consulted and Agree with Plan of Care Patient             Patient will benefit from skilled therapeutic intervention in order to improve the following deficits and impairments:  Improper body mechanics, Increased muscle spasms, Decreased strength, Abnormal gait, Postural dysfunction, Pain, Decreased activity tolerance, Decreased endurance  Visit Diagnosis: Pain in left hip  Chronic left-sided low back pain without sciatica     Problem List 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 06/18/2021   Primary localized osteoarthritis of left hip 01/28/2021   Primary osteoarthritis of left hip 01/28/2021   Primary localized osteoarthritis of right hip 04/09/2020   Primary osteoarthritis of right hip 04/09/2020   Arthritis of right hip 02/19/2020   Greater trochanteric bursitis of both hips 12/19/2019   Greater trochanteric bursitis of left hip 10/03/2019   Arthritis of left hip 10/03/2019   Left groin pain 03/01/2019   Right shoulder pain 12/27/2018   Chronic bilateral low back pain with right-sided sciatica 01/14/2018   Urinary frequency 01/14/2018   Degenerative arthritis of knee, bilateral 10/21/2017   Arthritis of knee, left 08/03/2017   GERD (gastroesophageal reflux disease) 03/27/2015   Preventative health care 01/04/2015   Constipation 07/15/2012   Allergic rhinitis 04/01/2012    Diabetes mellitus type 2, controlled ([place]) 10/14/2007   Carpal tunnel syndrome 10/14/2007   Dyslipidemia 10/13/2007   Essential hypertension 10/13/2007   Depression, recurrent ([place]) 10/13/2007    Dorene Ar, PTA 08/20/2021, 8:42 AM  [facility] [address] [place], [place], [address] Phone: [phone]   Fax:  [phone]  Name: Cathy [name] MRN: [id number] Date of Birth: [birth year]

## 2021-08-25 ENCOUNTER — Ambulatory Visit
Admission: RE | Admit: 2021-08-25 | Discharge: 2021-08-25 | Disposition: A | Discharge status: Home / Self Care | Payer: Medicare Other | Source: Ambulatory Visit | Attending: Family Medicine | Admitting: Family Medicine

## 2021-08-25 DIAGNOSIS — M5416 Radiculopathy, lumbar region: Secondary | ICD-10-CM

## 2021-08-25 DIAGNOSIS — M47817 Spondylosis without myelopathy or radiculopathy, lumbosacral region: Secondary | ICD-10-CM | POA: Diagnosis not present

## 2021-08-25 MED ORDER — METHYLPREDNISOLONE ACETATE 40 MG/ML INJ SUSP (RADIOLOG
80.0000 mg | Freq: Once | INTRAMUSCULAR | Status: AC
  Administered 2021-08-25: 80 mg via EPIDURAL

## 2021-08-25 MED ORDER — IOPAMIDOL (ISOVUE-M 200) INJECTION 41%
1.0000 mL | Freq: Once | INTRAMUSCULAR | Status: AC
  Administered 2021-08-25: 1 mL via EPIDURAL

## 2021-08-25 NOTE — Discharge Instructions (Signed)

## 2021-08-26 ENCOUNTER — Ambulatory Visit: Payer: Medicare Other | Admitting: Physical Therapy

## 2021-08-28 ENCOUNTER — Encounter: Payer: Self-pay | Admitting: Physical Therapy

## 2021-08-28 ENCOUNTER — Ambulatory Visit: Payer: Medicare Other | Attending: Family Medicine | Admitting: Physical Therapy

## 2021-08-28 ENCOUNTER — Other Ambulatory Visit: Payer: Self-pay

## 2021-08-28 DIAGNOSIS — G8929 Other chronic pain: Secondary | ICD-10-CM | POA: Diagnosis not present

## 2021-08-28 DIAGNOSIS — M25552 Pain in left hip: Secondary | ICD-10-CM | POA: Diagnosis not present

## 2021-08-28 DIAGNOSIS — M545 Low back pain, unspecified: Secondary | ICD-10-CM | POA: Insufficient documentation

## 2021-08-28 NOTE — Therapy (Addendum)
[place], [place], [address] Phone: [phone]   Fax:  [phone]  Physical Therapy Treatment / Discharge note  Patient Details  Name: Cathy [name] MRN: [id number] Date of Birth: 23-Aug-1946 Referring Provider (PT): Tear of gluteus maximus tendon [phone])   Encounter Date: 08/28/2021   PT End of Session - 08/28/21 1318     Visit Number 8    Number of Visits 17    Date for PT Re-Evaluation 09/10/21    Authorization Type UHC MCR: Kx mod at 15th visit, FOTO 6th and 10th    Progress Note Due on Visit 10    PT Start Time 1312    PT Stop Time 1350    PT Time Calculation (min) 38 min    Activity Tolerance Patient tolerated treatment well    Behavior During Therapy [facility] for tasks assessed/performed             Past Medical History:  Diagnosis Date   Chronic constipation    DJD (degenerative joint disease)    knees   Female cystocele    GERD (gastroesophageal reflux disease)    History of esophagitis    History of left inguinal hernia    History of MRSA infection    recurrent carbuncle   History of recurrent UTIs    Hyperlipidemia    Hypertension    Pre-diabetes    diet controlled   Urgency of urination     Past Surgical History:  Procedure Laterality Date   APPENDECTOMY  1980's   BREAST EXCISIONAL BIOPSY Left 1989   COLONOSCOPY  last one 06-18-2015   INGUINAL HERNIA REPAIR Left 05-10-2001   LAPAROSCOPIC LYSIS OF ADHESIONS  01/17/2019   OOPHORECTOMY Right 01/17/2019   PERINEOPLASTY  01/17/2019   SALPINGECTOMY Left 01/17/2019   TOTAL HIP ARTHROPLASTY Right 04/09/2020   Procedure: RIGH TOTAL HIP ARTHROPLASTY ANTERIOR APPROACH;  Surgeon: Melrose Nakayama, MD;  Location: [facility];  Service: Orthopedics;  Laterality: Right;   TOTAL HIP ARTHROPLASTY Left 01/28/2021   Procedure: LEFT TOTAL HIP ARTHROPLASTY ANTERIOR APPROACH;  Surgeon: Melrose Nakayama, MD;  Location: [facility];  Service:  Orthopedics;  Laterality: Left;   VAGINAL HYSTERECTOMY  1980's   VAGINAL PROLAPSE REPAIR N/A 03/02/2016   Procedure: COLOPLAST ANTERIOR  VAULT REPAIR WITH AXIS DERMIS. SACROSPINUS FIXATION, AUGMENTATION WITH AXIS DERMIS;  Surgeon: Carolan Clines, MD;  Location: [facility];  Service: Urology;  Laterality: N/A;    There were no vitals filed for this visit.   Subjective Assessment - 08/28/21 1315     Subjective I had the back injections Monday and I think it helped. I am walking better.    Diagnostic tests MRI 06/25/2021 IMPRESSION:  No evidence of acute fracture. High-grade partial tearing of the  left gluteus minimus and medius muscles with sub gluteus bursitis.  Mild muscle atrophy suggesting chronic tearing.     Low-grade partial tearing of the right proximal hamstring tendons.     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     Lower lumbar spine degenerative changes with multilevel disc bulging  and varying degrees of spinal canal and neural foraminal narrowing.  This could be further assessed with dedicated lumbar spine MRI if  clinically warranted.    Currently in Pain? Yes    Pain Score 6     Pain Location Back    Pain Orientation Right;Lower    Pain Descriptors / Indicators Aching    Pain Type Chronic  pain    Aggravating Factors  losts of housework    Pain Relieving Factors water aerobics/[employer]                [facility] PT Assessment - 08/28/21 0001       Observation/Other Assessments   Focus on Therapeutic Outcomes (FOTO)  72%      Strength   Right Hip Flexion 4+/5    Right Hip Extension 4/5    Right Hip ABduction 4-/5    Left Hip Flexion 4/5    Left Hip Extension 4/5    Left Hip ABduction 4-/5    Right Knee Flexion 4+/5    Right Knee Extension 4+/5    Left Knee Flexion 4+/5    Left Knee Extension 4+/5                           [facility] Adult PT Treatment/Exercise - 08/28/21 0001       Knee/Hip Exercises:  Stretches   Active Hamstring Stretch Limitations supine with strap x 3 each    Hip Flexor Stretch 2 reps;Left    Piriformis Stretch Limitations figure 4 push and pull gnetly bilateral    Gastroc Stretch Limitations runners stretch 15 sec x 2 each    Other Knee/Hip Stretches lower trunk rotations x 10 with 10 sec holds      Knee/Hip Exercises: Aerobic   Nustep 5 minutes UE/LE x L3      Knee/Hip Exercises: Standing   Hip Flexion 10 reps    Hip Flexion Limitations alternating with counter support    Hip Abduction 10 reps    Abduction Limitations bilat at counter    SLS 11 sec best    Other Standing Knee Exercises tandem stance 23  sec best      Knee/Hip Exercises: Supine   Straight Leg Raises 10 reps;2 sets    Straight Leg Raises Limitations with initial quad set                       PT Short Term Goals - 08/28/21 1326       PT SHORT TERM GOAL #1   Title pt to be IND with inital HEP    Time 4    Period Weeks    Status Achieved    Target Date 08/13/21      PT SHORT TERM GOAL #2   Title pt to verbalize/ demo efficeint gait pattern utilizing shortened stride with heel strike/ toe off    Baseline much improved    Time 4    Period Weeks    Status Achieved               PT Long Term Goals - 08/28/21 1327       PT LONG TERM GOAL #1   Title increase L hip abductor and extensor  strength to >/=4+/5 to promote hip stability    Baseline grossly 4-/5 to 4/5 hip abductor/extensor strength bilateral.    Time 8    Period Weeks    Status Not Met      PT LONG TERM GOAL #2   Title pt to be able to walk/ stand for >/= 60 min demonstrating efficient gait pattern with no report of pain or limtations    Baseline pt reports she can be active all day    Time 8    Period Weeks    Status Achieved  PT LONG TERM GOAL #3   Title increase FOTO score to >/=79% to demo improvement in function    Baseline 62% improved to 72%    Time 8    Period Weeks    Status  Partially Met      PT LONG TERM GOAL #4   Title pt to be IND with advanced HEP to be able to maintain and progress current LOF IND    Baseline Pt attends [employer] and water aerobics as well as completes HEP    Time 8    Period Weeks    Status Achieved                   Plan - 08/28/21 1333     Clinical Impression Statement Pt reports overal 50 %  improvement with PT to date. Her FOTO score, hip strength and activity tolerance have improved. Her gait has also improved. Her SLS and tamdem balance hold times have improved.  She is pleased with her current leval of function and ready to discharge to HEP. She is currently attending the [facility] for exercise and water aerobics. Reviewed her HEP.  She will continue her HEP to make continued LE/hip strength progress.    PT Treatment/Interventions ADLs/Self Care Home Management;Cryotherapy;Electrical Stimulation;Iontophoresis 20m/ml Dexamethasone;Moist Heat;Ultrasound;Gait training;Stair training;Therapeutic activities;Therapeutic exercise;Balance training;Neuromuscular re-education;Patient/family education;Manual techniques;Passive range of motion;Dry needling;Taping    PT Next Visit Plan discharge to HEP todau    PT Home Exercise Plan CAMNJTAD - SLR, bridge, clamshell,, PPT, side clam, standing hip abduction             Patient will benefit from skilled therapeutic intervention in order to improve the following deficits and impairments:  Improper body mechanics, Increased muscle spasms, Decreased strength, Abnormal gait, Postural dysfunction, Pain, Decreased activity tolerance, Decreased endurance  Visit Diagnosis: Pain in left hip  Chronic left-sided low back pain without sciatica     Problem List 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 06/18/2021   Primary localized osteoarthritis of left hip 01/28/2021   Primary  osteoarthritis of left hip 01/28/2021   Primary localized osteoarthritis of right hip 04/09/2020   Primary osteoarthritis of right hip 04/09/2020   Arthritis of right hip 02/19/2020   Greater trochanteric bursitis of both hips 12/19/2019   Greater trochanteric bursitis of left hip 10/03/2019   Arthritis of left hip 10/03/2019   Left groin pain 03/01/2019   Right shoulder pain 12/27/2018   Chronic bilateral low back pain with right-sided sciatica 01/14/2018   Urinary frequency 01/14/2018   Degenerative arthritis of knee, bilateral 10/21/2017   Arthritis of knee, left 08/03/2017   GERD (gastroesophageal reflux disease) 03/27/2015   Preventative health care 01/04/2015   Constipation 07/15/2012   Allergic rhinitis 04/01/2012   Diabetes mellitus type 2, controlled ([place] 10/14/2007   Carpal tunnel syndrome 10/14/2007   Dyslipidemia 10/13/2007   Essential hypertension 10/13/2007   Depression, recurrent ([place] 10/13/2007    DDorene Ar PTA 08/28/2021, 1:42 PM  [facility] [address]Phone: [phone]  Fax:  [phone] Name: Cathy ACOCELLAMRN: [id number] of Birth: [birth year]    PHYSICAL THERAPY DISCHARGE SUMMARY  Visits from Start of Care: 8  Current functional level related to goals / functional outcomes: See goals FOTO 72%   Remaining deficits: See assessment  Education / Equipment: HEP,theraband, posture   Patient agrees to discharge. Patient goals were partially met. Patient is being discharged due to being pleased with the current functional level.   Kristoffer Leamon PT, DPT, LAT, ATC  09/24/21  12:05 PM

## 2021-09-05 ENCOUNTER — Encounter: Payer: Self-pay | Admitting: Adult Health

## 2021-09-05 ENCOUNTER — Telehealth: Payer: Self-pay

## 2021-09-05 ENCOUNTER — Telehealth (INDEPENDENT_AMBULATORY_CARE_PROVIDER_SITE_OTHER): Payer: Medicare Other | Admitting: Adult Health

## 2021-09-05 VITALS — BP 134/77 | Ht 63.0 in | Wt 142.0 lb

## 2021-09-05 DIAGNOSIS — I951 Orthostatic hypotension: Secondary | ICD-10-CM | POA: Diagnosis not present

## 2021-09-05 NOTE — Telephone Encounter (Signed)
Please advise 

## 2021-09-05 NOTE — Progress Notes (Signed)
Virtual Visit via Telephone Note  I connected with Cathy [name] on 09/05/21 at 10:00 AM EDT by telephone and verified that I am speaking with the correct person using two identifiers.   I discussed the limitations, risks, security and privacy concerns of performing an evaluation and management service by telephone and the availability of in person appointments. I also discussed with the patient that there may be a patient responsible charge related to this service. The patient expressed understanding and agreed to proceed.  Location patient: home Location provider: work or home office Participants present for the call: patient, provider Patient did not have a visit in the prior 7 days to address this/these issue(s).   History of Present Illness: Is a 75 year old female who is being evaluated today for an acute issue.  About 5 days ago when she was getting chair she had momentary "dizziness".  This resolved very quickly and she has not had any symptoms since.  Denies chest pain or shortness of breath.  Is staying hydrated and eating well.   Observations/Objective: Patient sounds cheerful and well on the phone. I do not appreciate any SOB. Speech and thought processing are grossly intact. Patient reported vitals:  Assessment and Plan: 1. Orthostatic hypotension -Reassurance given.  Seems to be more orthostatic hypotension from getting out of a seated position too quickly.  Encouraged to try and change positions a little more slowly.  Follow-up if symptoms continue  Follow Up Instructions:  I did not refer this patient for an OV in the next 24 hours for this/these issue(s).  I discussed the assessment and treatment plan with the patient. The patient was provided an opportunity to ask questions and all were answered. The patient agreed with the plan and demonstrated an understanding of the instructions.   The patient was advised to call back or seek an in-person evaluation if the  symptoms worsen or if the condition fails to improve as anticipated.  I provided 11 minutes of non-face-to-face time during this encounter.   [name], NP

## 2021-09-05 NOTE — Progress Notes (Deleted)
   Subjective:    Patient ID: Cathy [name], female    DOB: 1945/12/05, 75 y.o.   MRN: [id number]  HPI 75 year old female who  has a past medical history of Chronic constipation, DJD (degenerative joint disease), Female cystocele, GERD (gastroesophageal reflux disease), History of esophagitis, History of left inguinal hernia, History of MRSA infection, History of recurrent UTIs, Hyperlipidemia, Hypertension, Pre-diabetes, and Urgency of urination.    Review of Systems     Objective:   Physical Exam        Assessment & Plan:

## 2021-09-05 NOTE — Telephone Encounter (Signed)
Patient called stating that she had a virtual visit this morning and she forgot to mention that she needed an Rx for a nerve pill for her neck.

## 2021-09-06 ENCOUNTER — Telehealth: Payer: Self-pay | Admitting: Family Medicine

## 2021-09-09 NOTE — Telephone Encounter (Signed)
Patient returned call from last week about rx for nerve in her neck     Good callback number is [phone]     Please Advise

## 2021-09-10 NOTE — Telephone Encounter (Signed)
Pt has made an appt for 09-11-2021

## 2021-09-11 ENCOUNTER — Other Ambulatory Visit: Payer: Self-pay

## 2021-09-11 ENCOUNTER — Encounter: Payer: Self-pay | Admitting: Adult Health

## 2021-09-11 ENCOUNTER — Ambulatory Visit (INDEPENDENT_AMBULATORY_CARE_PROVIDER_SITE_OTHER): Payer: Medicare Other | Admitting: Adult Health

## 2021-09-11 VITALS — BP 118/62 | HR 66 | Temp 97.0°F | Ht 63.0 in | Wt 146.0 lb

## 2021-09-11 DIAGNOSIS — R350 Frequency of micturition: Secondary | ICD-10-CM

## 2021-09-11 DIAGNOSIS — M542 Cervicalgia: Secondary | ICD-10-CM | POA: Diagnosis not present

## 2021-09-11 DIAGNOSIS — R079 Chest pain, unspecified: Secondary | ICD-10-CM | POA: Diagnosis not present

## 2021-09-11 LAB — URINALYSIS
Bilirubin Urine: NEGATIVE
Hgb urine dipstick: NEGATIVE
Ketones, ur: NEGATIVE
Leukocytes,Ua: NEGATIVE
Nitrite: NEGATIVE
Specific Gravity, Urine: <=1.005 — AB (ref 1.000–1.030)
Total Protein, Urine: NEGATIVE
Urine Glucose: NEGATIVE
Urobilinogen, UA: 0.2 (ref 0.0–1.0)
pH: 6.5 (ref 5.0–8.0)

## 2021-09-11 MED ORDER — CYCLOBENZAPRINE HCL 10 MG PO TABS: 10.0000 mg | ORAL_TABLET | Freq: Every day | ORAL | 0 refills | Status: DC

## 2021-09-11 NOTE — Addendum Note (Signed)
Addended by: Amanda Cockayne on: 09/11/2021 10:26 AM   Modules accepted: Orders

## 2021-09-11 NOTE — Progress Notes (Signed)
Subjective:    Patient ID: Cathy [name], female    DOB: 1946-04-06, 75 y.o.   MRN: [id number]  HPI  75 year old female who  has a past medical history of Chronic constipation, DJD (degenerative joint disease), Female cystocele, GERD (gastroesophageal reflux disease), History of esophagitis, History of left inguinal hernia, History of MRSA infection, History of recurrent UTIs, Hyperlipidemia, Hypertension, Pre-diabetes, and Urgency of urination.  She presents to the office today for neck pain x2 weeks and left-sided chest pain for the last week.  Both areas of discomfort are described as aching quality and has been pretty consistent.  Has not been using anything over-the-counter to help with her pain.  Pain in her neck is worse with certain movements.  Denies headaches, blurred vision, numbness or tingling in her left arm, pain up her jawline.  Additionally, she wants to make sure she does not have a UTI. Has constant urinary frequency. Denies other symptoms.   Review of Systems See HPI   Past Medical History:  Diagnosis Date   Chronic constipation    DJD (degenerative joint disease)    knees   Female cystocele    GERD (gastroesophageal reflux disease)    History of esophagitis    History of left inguinal hernia    History of MRSA infection    recurrent carbuncle   History of recurrent UTIs    Hyperlipidemia    Hypertension    Pre-diabetes    diet controlled   Urgency of urination     Social History   Socioeconomic History   Marital status: Single    Spouse name: Not on file   Number of children: Not on file   Years of education: Not on file   Highest education level: Not on file  Occupational History   Not on file  Tobacco Use   Smoking status: Former    Years: 1.00    Types: Cigarettes    Quit date: 02/28/1996    Years since quitting: 25.5   Smokeless tobacco: Never  Vaping Use   Vaping Use: Never used  Substance and Sexual Activity   Alcohol use: Yes     Alcohol/week: 4.0 standard drinks    Types: 4 Standard drinks or equivalent per week    Comment: on weekends   Drug use: No   Sexual activity: Not on file  Other Topics Concern   Not on file  Social History Narrative   Single   Former Smoker  -  quit 10 to 11 years ago (light smoker)   Alcohol use-yes     2 children    Occupation: [employer]    Social Determinants of Health   Financial Resource Strain: Not on file  Food Insecurity: Not on file  Transportation Needs: Not on file  Physical Activity: Not on file  Stress: Not on file  Social Connections: Not on file  Intimate Partner Violence: Not on file    Past Surgical History:  Procedure Laterality Date   APPENDECTOMY  1980's   BREAST EXCISIONAL BIOPSY Left 1989   COLONOSCOPY  last one 06-18-2015   INGUINAL HERNIA REPAIR Left 05-10-2001   LAPAROSCOPIC LYSIS OF ADHESIONS  01/17/2019   OOPHORECTOMY Right 01/17/2019   PERINEOPLASTY  01/17/2019   SALPINGECTOMY Left 01/17/2019   TOTAL HIP ARTHROPLASTY Right 04/09/2020   Procedure: RIGH TOTAL HIP ARTHROPLASTY ANTERIOR APPROACH;  Surgeon: Melrose Nakayama, MD;  Location: [facility];  Service: Orthopedics;  Laterality: Right;  TOTAL HIP ARTHROPLASTY Left 01/28/2021   Procedure: LEFT TOTAL HIP ARTHROPLASTY ANTERIOR APPROACH;  Surgeon: Melrose Nakayama, MD;  Location: [facility];  Service: Orthopedics;  Laterality: Left;   VAGINAL HYSTERECTOMY  1980's   VAGINAL PROLAPSE REPAIR N/A 03/02/2016   Procedure: COLOPLAST ANTERIOR  VAULT REPAIR WITH AXIS DERMIS. SACROSPINUS FIXATION, AUGMENTATION WITH AXIS DERMIS;  Surgeon: Carolan Clines, MD;  Location: [facility];  Service: Urology;  Laterality: N/A;    Family History  Problem Relation Age of Onset   Aneurysm Mother 39       deceased secondary to brain aneurysm   Liver cancer Father 51       deceased   Diabetes Other        grandmother   Colon cancer Neg Hx    Stomach cancer Neg Hx    Rectal cancer Neg Hx     Esophageal cancer Neg Hx    Breast cancer Neg Hx     Allergies  Allergen Reactions   Penicillins Hives    Has patient had a PCN reaction causing immediate rash, facial/tongue/throat swelling, SOB or lightheadedness with hypotension: Yes Has patient had a PCN reaction causing severe rash involving mucus membranes or skin necrosis: No Has patient had a PCN reaction that required hospitalization: No Has patient had a PCN reaction occurring within the last 10 years: No If all of the above answers are "NO", then may proceed with Cephalosporin use.     Current Outpatient Medications on File Prior to Visit  Medication Sig Dispense Refill   Ascorbic Acid (VITAMIN C) 1000 MG tablet Take 1,000 mg by mouth daily.     aspirin 81 MG tablet Take 1 tablet (81 mg total) by mouth 2 (two) times daily after a meal. (Patient taking differently: Take 81 mg by mouth daily.) 60 tablet 0   b complex vitamins capsule Take 1 capsule by mouth daily.     Biotin 1000 MCG tablet Take 1,000 mcg by mouth daily.     Blood Glucose Monitoring Suppl (ONETOUCH VERIO FLEX SYSTEM) w/Device KIT TEST TWICE DAILY 1 kit 0   Calcium Carbonate-Vitamin D 600-400 MG-UNIT tablet Take 1 tablet by mouth daily.     Chromium 1000 MCG TABS Take 1,000 mcg by mouth daily.     Cinnamon 500 MG capsule Take 1,000 mg by mouth daily.     CRANBERRY PO Take 2 tablets by mouth daily. 4200 mg     Cyanocobalamin (VITAMIN B-12) 2500 MCG SUBL Place 2,500 mcg under the tongue daily.     cyclobenzaprine (FLEXERIL) 10 MG tablet Take 1 tablet (10 mg total) by mouth at bedtime. 30 tablet 0   ELDERBERRY PO Take 1 capsule by mouth daily.     estradiol (ESTRACE) 0.1 MG/GM vaginal cream Place 1 Applicatorful vaginally every other day. In the evening  3   fexofenadine (ALLEGRA) 180 MG tablet Take 180 mg by mouth daily.     fluticasone (FLONASE) 50 MCG/ACT nasal spray SHAKE LIQUID AND USE 2 SPRAYS IN EACH NOSTRIL DAILY 48 g 2   Lancets (ONETOUCH DELICA PLUS  [id number]) MISC USE ONCE DAILY 100 each 3   lisinopril (ZESTRIL) 10 MG tablet TAKE 1 TABLET(10 MG) BY MOUTH DAILY 90 tablet 3   Misc Natural Products (TART CHERRY ADVANCED) CAPS Take 2 capsules by mouth daily.     Multiple Vitamins-Minerals (MULTIVITAMIN WITH MINERALS) tablet Take 1 tablet by mouth daily. Centrum silver     ONETOUCH VERIO test strip USE TWICE DAILY  200 strip 3   OVER THE COUNTER MEDICATION Take 1 tablet by mouth daily as needed (constipation). Vital Lax     polyethylene glycol (MIRALAX / GLYCOLAX) packet Take 17 g by mouth daily as needed for mild constipation.     rosuvastatin (CRESTOR) 40 MG tablet TAKE 1 TABLET(40 MG) BY MOUTH DAILY 90 tablet 1   Turmeric 500 MG CAPS Take 1,000 mg by mouth daily.      [DISCONTINUED] valsartan (DIOVAN) 160 MG tablet Take 1/2 tablet by mouth once daily 30 tablet 5   No current facility-administered medications on file prior to visit.    BP 118/62   Pulse 66   Temp (!) 97 F (36.1 C) (Temporal)   Ht 5' 3"  (1.6 m)   Wt 146 lb (66.2 kg)   SpO2 100%   BMI 25.86 kg/m       Objective:   Physical Exam Vitals reviewed.  Constitutional:      Appearance: Normal appearance.  Cardiovascular:     Rate and Rhythm: Normal rate and regular rhythm.     Pulses: Normal pulses.     Heart sounds: Normal heart sounds.  Pulmonary:     Effort: Pulmonary effort is normal.     Breath sounds: Normal breath sounds.  Musculoskeletal:        General: Tenderness present. Normal range of motion.     Comments: She has reproducible tenderness with palpation throughout shoulders and neck as well as along left-sided chest wall.  Skin:    General: Skin is warm and dry.  Neurological:     General: No focal deficit present.     Mental Status: She is alert and oriented to person, place, and time.  Psychiatric:        Mood and Affect: Mood normal.        Behavior: Behavior normal.        Thought Content: Thought content normal.        Judgment: Judgment  normal.      Assessment & Plan:  1. Chest pain, unspecified type - Appears MSK in nature. No signs of cardiac cause  - cyclobenzaprine (FLEXERIL) 10 MG tablet; Take 1 tablet (10 mg total) by mouth at bedtime.  Dispense: 30 tablet; Refill: 0 - EKG 12-Lead- NSR, Rate 70  2. Neck pain - MSK in nature  - cyclobenzaprine (FLEXERIL) 10 MG tablet; Take 1 tablet (10 mg total) by mouth at bedtime.  Dispense: 30 tablet; Refill: 0  3. Frequent urination - Urinalysis; Future  [name], NP  Time spent on chart review, time with patient; discussion of muscular injuries/chest pain, treatment, follow up plan, and documentation 30 minutes

## 2021-09-12 ENCOUNTER — Telehealth: Payer: Self-pay | Admitting: Family Medicine

## 2021-09-12 NOTE — Telephone Encounter (Signed)
Patient called following up on this refill. She said that she would like at night. Please advise.

## 2021-09-12 NOTE — Telephone Encounter (Signed)
Patient called stating that her handicap placard will expire next month. She asked if Dr Tamala Julian could complete a new form for her?  Please contact patient when ready to be picked up.

## 2021-09-15 ENCOUNTER — Telehealth: Payer: Self-pay | Admitting: Adult Health

## 2021-09-15 NOTE — Telephone Encounter (Signed)
Left message for patient to call back for dosage and frequency that she is doing for gabapentin.

## 2021-09-15 NOTE — Telephone Encounter (Signed)
Left message for patient to pick up form.

## 2021-09-15 NOTE — Telephone Encounter (Signed)
Left message for patient to call back and schedule Medicare Annual Wellness Visit (AWV) either virtually or in office. Left  my Herbie Drape number [phone]   Last AWV ;06/10/20\ please schedule at anytime with [facility] Nurse Health Advisor 1 or 2   This should be a 45 minute visit.

## 2021-09-16 NOTE — Telephone Encounter (Signed)
Patient states that she has been taking one 100mg  at bedtime for her hand pain.

## 2021-09-17 ENCOUNTER — Other Ambulatory Visit: Payer: Self-pay

## 2021-09-17 MED ORDER — GABAPENTIN 100 MG PO CAPS: 100.0000 mg | ORAL_CAPSULE | Freq: Every day | ORAL | 0 refills | Status: DC

## 2021-09-17 NOTE — Telephone Encounter (Signed)
Med refilled.

## 2021-09-23 NOTE — Telephone Encounter (Signed)
Left VM to call back 

## 2021-09-23 NOTE — Telephone Encounter (Signed)
No could be a fall risk somewhat with the medicine to do it regualrly.  Would consider maybe cymbalta 20 mg daily if she would like.

## 2021-09-23 NOTE — Telephone Encounter (Signed)
Patient said that she had forgotten that Gabapentin causes her to have crazy dreams. Is there anything else that Dr Tamala Julian would prescribe? She mentioned possible Tramadol?  Please advise.

## 2021-09-25 ENCOUNTER — Telehealth: Payer: Self-pay

## 2021-09-25 NOTE — Telephone Encounter (Signed)
Called pt serveral times no answer. Pt also need lab results.

## 2021-09-25 NOTE — Telephone Encounter (Signed)
Patient called asking for a referral to a Cardiologist

## 2021-09-26 NOTE — Telephone Encounter (Signed)
Patient notified of lab update  and verbalized understanding. Pt state that she does not need Cardio referral anymore. She will call us back if she needs it.

## 2021-10-08 NOTE — Progress Notes (Addendum)
[place] Phone: [phone] Subjective:   Cathy [name], am serving as a scribe for Dr. Hulan Saas.  This visit occurred during the SARS-CoV-2 public health emergency.  Safety protocols were in place, including screening questions prior to the visit, additional usage of staff PPE, and extensive cleaning of exam room while observing appropriate contact time as indicated for disinfecting solutions.   I'm seeing this patient by the request  of:  Cathy Peng, NP  CC: Left calf pain, bilateral knee and bilateral hand pain  [id number]  08/12/2021 Has had radicular symptoms on the right side previously but now having more potential left side.  Patient has had degenerative disc disease and I do feel that further evaluation with epidural for diagnostic as well as therapeutic.  Discussed home exercises.  We will see how patient Epidural follow-up in 4 to 6 weeks  Known arthritic changes of both knees.  Still having difficulty on the left knee.  Patient declined injection.  Concern for potentially lumbar radiculopathy that could be contributing.  We will try epidural with patient's previous MRI showing multiple levels of nerve root impingement and potentially varying degrees of spinal stenosis.  Patient will have that done and follow-up with me again in 4 to 6 weeks.  Update 10/09/2021 Cathy [name] is a 75 y.o. female coming in with complaint of L calf pain. Epidural on 08/25/2021.  States that on Thursday of last week she started to feel a "charlie horse" in L calf. [name] injury to this area. Continues to have numbness in L leg since her surgery.    Also complains of B wrist pain. Previous carpal tunnel injections  L  in July 2022. R in August 2022.   Also would knee injections in B knees. Visco given in August.  Worsening pain with instability noted.  Affecting daily activities.  Noticing she is walking less.  Also  noticed that now having increased in swelling of the lower extremity.      Past Medical History:  Diagnosis Date   Chronic constipation    DJD (degenerative joint disease)    knees   Female cystocele    GERD (gastroesophageal reflux disease)    History of esophagitis    History of left inguinal hernia    History of MRSA infection    recurrent carbuncle   History of recurrent UTIs    Hyperlipidemia    Hypertension    Pre-diabetes    diet controlled   Urgency of urination    Past Surgical History:  Procedure Laterality Date   APPENDECTOMY  1980's   BREAST EXCISIONAL BIOPSY Left 1989   COLONOSCOPY  last one 06-18-2015   INGUINAL HERNIA REPAIR Left 05-10-2001   LAPAROSCOPIC LYSIS OF ADHESIONS  01/17/2019   OOPHORECTOMY Right 01/17/2019   PERINEOPLASTY  01/17/2019   SALPINGECTOMY Left 01/17/2019   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 SACROSPINUS FIXATION, AUGMENTATION WITH AXIS DERMIS;  Surgeon: Carolan Clines, MD;  Location: [facility];  Service: Urology;  Laterality: N/A;   Social History   Socioeconomic History   Marital status: Single    Spouse name: Not on file   Number of children: Not on file   Years of education: Not on file   Highest education level: Not on file  Occupational History   Not on file  Tobacco Use   Smoking status: Former    Years: 1.00    Types: Cigarettes    Quit date: 02/28/1996    Years since quitting: 25.6   Smokeless tobacco: Never  Vaping Use    Vaping Use: Never used  Substance and Sexual Activity   Alcohol use: Yes    Alcohol/week: 4.0 standard drinks    Types: 4 Standard drinks or equivalent per week    Comment: on weekends   Drug use: [name]   Sexual activity: Not on file  Other Topics Concern   Not on file  Social History Narrative   Single   Former Smoker  -  quit 10 to 11 years ago (light smoker)   Alcohol use-yes     2 children    Occupation: [employer]    Social Determinants of Health   Financial Resource Strain: Not on file  Food Insecurity: Not on file  Transportation Needs: Not on file  Physical Activity: Not on file  Stress: Not on file  Social Connections: Not on file   Allergies  Allergen Reactions   Penicillins Hives    Has patient had a PCN reaction causing immediate rash, facial/tongue/throat swelling, SOB or lightheadedness with hypotension: Yes Has patient had a PCN reaction causing severe rash involving mucus membranes or skin necrosis: [name] Has patient had a PCN reaction that required hospitalization: [name] Has patient had a PCN reaction occurring within the last 10 years: [name] If all of the above answers are "[name]", then may proceed with Cephalosporin use.    Family History  Problem Relation Age of Onset   Aneurysm Mother 57       deceased secondary to brain aneurysm   Liver cancer Father 18       deceased   Diabetes Other        grandmother   Colon cancer Neg Hx    Stomach cancer Neg Hx    Rectal cancer Neg Hx    Esophageal cancer Neg Hx    Breast cancer Neg Hx      Current Outpatient Medications (Cardiovascular):    lisinopril (ZESTRIL) 10 MG tablet, TAKE 1 TABLET(10 MG) BY MOUTH DAILY   rosuvastatin (CRESTOR) 40 MG tablet, TAKE 1 TABLET(40 MG) BY MOUTH DAILY  Current Outpatient Medications (Respiratory):    fexofenadine (ALLEGRA) 180 MG tablet, Take 180 mg by mouth daily.   fluticasone (FLONASE) 50 MCG/ACT nasal spray, SHAKE LIQUID AND USE 2 SPRAYS IN EACH NOSTRIL  DAILY  Current Outpatient Medications (Analgesics):    aspirin 81 MG tablet, Take 1 tablet (81 mg total) by mouth 2 (two) times daily after a meal. (Patient taking differently: Take 81 mg by mouth daily.)  Current Outpatient Medications (Hematological):    Cyanocobalamin (VITAMIN B-12) 2500 MCG SUBL, Place 2,500 mcg under the tongue daily.  Current Outpatient Medications (Other):    Ascorbic Acid (VITAMIN C) 1000 MG tablet, Take 1,000 mg by mouth daily.   b complex vitamins capsule, Take 1 capsule by mouth daily.   Biotin 1000 MCG tablet, Take 1,000 mcg by mouth daily.   Blood Glucose Monitoring Suppl [facility]  [place]) w/Device KIT, TEST TWICE DAILY   Calcium Carbonate-Vitamin D 600-400 MG-UNIT tablet, Take 1 tablet by mouth daily.   Chromium 1000 MCG TABS, Take 1,000 mcg by mouth daily.   Cinnamon 500 MG capsule, Take 1,000 mg by mouth daily.   CRANBERRY PO, Take 2 tablets by mouth daily. 4200 mg   cyclobenzaprine (FLEXERIL) 10 MG tablet, Take 1 tablet (10 mg total) by mouth at bedtime.   cyclobenzaprine (FLEXERIL) 10 MG tablet, Take 1 tablet (10 mg total) by mouth at bedtime.   ELDERBERRY PO, Take 1 capsule by mouth daily.   estradiol (ESTRACE) 0.1 MG/GM vaginal cream, Place 1 Applicatorful vaginally every other day. In the evening   gabapentin (NEURONTIN) 100 MG capsule, Take 1 capsule (100 mg total) by mouth at bedtime.   Lancets (ONETOUCH DELICA PLUS [id number]) MISC, USE ONCE DAILY   Misc Natural Products (TART CHERRY ADVANCED) CAPS, Take 2 capsules by mouth daily.   Multiple Vitamins-Minerals (MULTIVITAMIN WITH MINERALS) tablet, Take 1 tablet by mouth daily. Centrum silver   ONETOUCH VERIO test strip, USE TWICE DAILY   OVER THE COUNTER MEDICATION, Take 1 tablet by mouth daily as needed (constipation). Vital Lax   polyethylene glycol (MIRALAX / GLYCOLAX) packet, Take 17 g by mouth daily as needed for mild constipation.   Turmeric 500 MG CAPS, Take 1,000 mg by mouth daily.     Reviewed prior external information including notes and imaging from  primary care provider As well as notes that were available from care everywhere and other healthcare systems.  Past medical history, social, surgical and family history all reviewed in electronic medical record.  [name] pertanent information unless stated regarding to the chief complaint.   Review of Systems:  [name] headache, visual changes, nausea, vomiting, diarrhea, constipation, dizziness, abdominal pain, skin rash, fevers, chills, night sweats, weight loss, swollen lymph nodes, chest pain, shortness of breath, mood changes. POSITIVE muscle aches, body aches, joint swelling  Objective  Blood pressure 106/80, pulse (!) 113, height _0  (1.6 m), weight 144 lb (65.3 kg), SpO2 98 %.   General: [name] apparent distress alert and oriented x3 mood and affect normal, dressed appropriately.  HEENT: Pupils equal, extraocular movements intact  Respiratory: Patient's speak in full sentences and does not appear short of breath  Cardiovascular: [name] lower extremity edema, non tender, [name] erythema  Gait severely antalgic Knee exams bilaterally show significant instability noted with valgus and varus force.  Trace effusion noted of both knees.  Patient does have likely a Baker's cyst noted and potential increase in diameter of the proximal calf.  Tender to palpation in the popliteal area.  Hand exam shows severely positive Tinel sign bilaterally.  Mild thenar eminence wasting on the left hand.  Limited muscular skeletal ultrasound was performed and interpreted by Hulan Saas, M  Limited ultrasound of patient's left Popliteal area shows the patient does have a Baker's cyst likely ruptured at this time.  Trace effusion noted of the patellofemoral joint as well. Impression: Likely ruptured Baker's cyst  Procedure: Real-time Ultrasound Guided Injection of left carpal tunnel Device: GE Logiq Q7 Ultrasound guided injection is preferred based  studies that show increased duration, increased effect, greater accuracy, decreased procedural pain, increased response rate with ultrasound guided versus blind injection.  Verbal informed consent obtained.  Time-out conducted.  Noted [name] overlying erythema, induration, or other signs of local infection.  Skin prepped in a sterile fashion.  Local anesthesia: Topical Ethyl chloride.  With sterile technique and  under real time ultrasound guidance:  median nerve visualized.  23g 5/8 inch needle inserted distal to proximal approach into nerve sheath. Pictures taken nfor needle placement.  0.5 cc of 0.5% Marcaine and 0.5 cc of Kenalog 40 mg/mL Completed without difficulty  Pain immediately improved suggesting accurate placement of the medication.  Advised to call if fevers/chills, erythema, induration, drainage, or persistent bleeding.   Impression: Technically successful ultrasound guided injection.  After informed written and verbal consent, patient was seated on exam table. Right knee was prepped with alcohol swab and utilizing anterolateral approach, patient's right knee space was injected with 4:1  marcaine 0.5%: Kenalog 3m/dL. Patient tolerated the procedure well without immediate complications.  After informed written and verbal consent, patient was seated on exam table. Left knee was prepped with alcohol swab and utilizing anterolateral approach, patient's left knee space was injected with 4:1  marcaine 0.5%: Kenalog 436mdL. Patient tolerated the procedure well without immediate complications.   Impression and Recommendations:    The above documentation has been reviewed and is accurate and complete Cathy [name]

## 2021-10-09 ENCOUNTER — Other Ambulatory Visit: Payer: Self-pay

## 2021-10-09 ENCOUNTER — Telehealth: Payer: Self-pay | Admitting: Family Medicine

## 2021-10-09 ENCOUNTER — Ambulatory Visit (INDEPENDENT_AMBULATORY_CARE_PROVIDER_SITE_OTHER): Payer: Medicare Other | Admitting: Family Medicine

## 2021-10-09 ENCOUNTER — Encounter: Payer: Self-pay | Admitting: Family Medicine

## 2021-10-09 ENCOUNTER — Ambulatory Visit (HOSPITAL_COMMUNITY)
Admission: RE | Admit: 2021-10-09 | Discharge: 2021-10-09 | Disposition: A | Discharge status: Home / Self Care | Payer: Medicare Other | Source: Ambulatory Visit | Attending: Family Medicine | Admitting: Family Medicine

## 2021-10-09 ENCOUNTER — Ambulatory Visit: Payer: Self-pay

## 2021-10-09 VITALS — BP 106/80 | HR 113 | Ht 63.0 in | Wt 144.0 lb

## 2021-10-09 DIAGNOSIS — M79662 Pain in left lower leg: Secondary | ICD-10-CM | POA: Diagnosis not present

## 2021-10-09 DIAGNOSIS — G5603 Carpal tunnel syndrome, bilateral upper limbs: Secondary | ICD-10-CM

## 2021-10-09 DIAGNOSIS — M170 Bilateral primary osteoarthritis of knee: Secondary | ICD-10-CM

## 2021-10-09 NOTE — Telephone Encounter (Signed)
Received a call from [facility]. Patient is negative for DVT.

## 2021-10-09 NOTE — Assessment & Plan Note (Signed)
Bilateral injections again given today.  Patient does have acute on chronic exacerbation.  Worsening pain again and would like to consider the viscosupplementation again when she is due in February.  Hopefully this will help out significantly.  Is having the left calf pain that I think is secondary to a ruptured Baker's cyst but will rule out a blood clot with a Doppler

## 2021-10-09 NOTE — Patient Instructions (Signed)
Good to see you.  We injected the knees and the left wrist today  If any chest pain, shortness of breath please seek medical attention immediately.  We are getting a scan of your leg to rule out bloo clot.  See me on the 2nd as scheduled otherwise and we will do the other wrist

## 2021-10-09 NOTE — Patient Instructions (Addendum)
  [facility] [address] [place], [place] [address] [phone] 1:00pm appointment but please arrive at 12:45pm   Will get gel approved for February  See you in December

## 2021-10-22 ENCOUNTER — Telehealth: Payer: Self-pay | Admitting: Adult Health

## 2021-10-22 NOTE — Telephone Encounter (Signed)
Left message for patient to call back and schedule Medicare Annual Wellness Visit (AWV) either virtually or in office. Left  my Cathy [name] number [phone]   Last AWV ;06/10/20 please schedule at anytime with [facility] Nurse Health Advisor 1 or 2   This should be a 45 minute visit.

## 2021-10-23 NOTE — Progress Notes (Signed)
[name] [address] [place] Phone: [phone] Subjective:   [name], am serving as a scribe for Dr. Hulan Saas. This visit occurred during the SARS-CoV-2 public health emergency.  Safety protocols were in place, including screening questions prior to the visit, additional usage of staff PPE, and extensive cleaning of exam room while observing appropriate contact time as indicated for disinfecting solutions.   I'm seeing this patient by the request  of:  [name], NP  CC: Bilateral knee and bilateral carpal tunnel follow-up  UEK:[id number]  10/09/2021 Bilateral injections again given today.  Patient does have acute on chronic exacerbation.  Worsening pain again and would like to consider the viscosupplementation again when she is due in February.  Hopefully this will help out significantly.  Is having the left calf pain that I think is secondary to a ruptured Baker's cyst but will rule out a blood clot with a Doppler  Update 10/24/2021 Cathy [name] is a 75 y.o. female coming in with complaint of B knee and B carpal tunnel. Injected L wrist and knees last visit. Patient states knees feeling ok. Left is worse than the right. Wrist doing okay. Would like injection in right. Says gel helps more. Wants to know about the left calf, blood clot. No other complaints patient did have a Doppler that did not show any type of clots and did have more of a Baker's cyst bilateral thinking      Past Medical History:  Diagnosis Date   Chronic constipation    DJD (degenerative joint disease)    knees   Female cystocele    GERD (gastroesophageal reflux disease)    History of esophagitis    History of left inguinal hernia    History of MRSA infection    recurrent carbuncle   History of recurrent UTIs    Hyperlipidemia    Hypertension    Pre-diabetes    diet controlled   Urgency of urination    Past Surgical History:   Procedure Laterality Date   APPENDECTOMY  1980's   BREAST EXCISIONAL BIOPSY Left 1989   COLONOSCOPY  last one 06-18-2015   INGUINAL HERNIA REPAIR Left 05-10-2001   LAPAROSCOPIC LYSIS OF ADHESIONS  01/17/2019   OOPHORECTOMY Right 01/17/2019   PERINEOPLASTY  01/17/2019   SALPINGECTOMY Left 01/17/2019   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 SACROSPINUS FIXATION, AUGMENTATION WITH AXIS DERMIS;  Surgeon: Carolan Clines, MD;  Location: [facility];  Service: Urology;  Laterality: N/A;   Social History   Socioeconomic History   Marital status: Single    Spouse name: Not on file   Number of children: Not on file   Years of education: Not on file   Highest education level: Not on file  Occupational History   Not on file  Tobacco Use   Smoking status: Former    Years: 1.00    Types: Cigarettes    Quit date: 02/28/1996    Years since quitting: 25.6   Smokeless  tobacco: Never  Vaping Use   Vaping Use: Never used  Substance and Sexual Activity   Alcohol use: Yes    Alcohol/week: 4.0 standard drinks    Types: 4 Standard drinks or equivalent per week    Comment: on weekends   Drug use: No   Sexual activity: Not on file  Other Topics Concern   Not on file  Social History Narrative   Single   Former Smoker  -  quit 10 to 11 years ago (light smoker)   Alcohol use-yes     2 children    Occupation: [employer]    Social Determinants of Health   Financial Resource Strain: Not on  file  Food Insecurity: Not on file  Transportation Needs: Not on file  Physical Activity: Not on file  Stress: Not on file  Social Connections: Not on file   Allergies  Allergen Reactions   Penicillins Hives    Has patient had a PCN reaction causing immediate rash, facial/tongue/throat swelling, SOB or lightheadedness with hypotension: Yes Has patient had a PCN reaction causing severe rash involving mucus membranes or skin necrosis: No Has patient had a PCN reaction that required hospitalization: No Has patient had a PCN reaction occurring within the last 10 years: No If all of the above answers are "NO", then may proceed with Cephalosporin use.    Family History  Problem Relation Age of Onset   Aneurysm Mother 62       deceased secondary to brain aneurysm   Liver cancer Father 63       deceased   Diabetes Other        grandmother   Colon cancer Neg Hx    Stomach cancer Neg Hx    Rectal cancer Neg Hx    Esophageal cancer Neg Hx    Breast cancer Neg Hx      Current Outpatient Medications (Cardiovascular):    lisinopril (ZESTRIL) 10 MG tablet, TAKE 1 TABLET(10 MG) BY MOUTH DAILY   rosuvastatin (CRESTOR) 40 MG tablet, TAKE 1 TABLET(40 MG) BY MOUTH DAILY  Current Outpatient Medications (Respiratory):    fexofenadine (ALLEGRA) 180 MG tablet, Take 180 mg by mouth daily.   fluticasone (FLONASE) 50 MCG/ACT nasal spray, SHAKE LIQUID AND USE 2 SPRAYS IN EACH NOSTRIL DAILY  Current Outpatient Medications (Analgesics):    aspirin 81 MG tablet, Take 1 tablet (81 mg total) by mouth 2 (two) times daily after a meal. (Patient taking differently: Take 81 mg by mouth daily.)  Current Outpatient Medications (Hematological):    Cyanocobalamin (VITAMIN B-12) 2500 MCG SUBL, Place 2,500 mcg under the tongue daily.  Current Outpatient Medications (Other):    Ascorbic Acid (VITAMIN C) 1000 MG tablet, Take 1,000 mg by mouth daily.   b complex vitamins capsule, Take 1 capsule by mouth daily.    Biotin 1000 MCG tablet, Take 1,000 mcg by mouth daily.   Blood Glucose Monitoring Suppl (ONETOUCH VERIO FLEX SYSTEM) w/Device KIT, TEST TWICE DAILY   Calcium Carbonate-Vitamin D 600-400 MG-UNIT tablet, Take 1 tablet by mouth daily.   Chromium 1000 MCG TABS, Take 1,000 mcg by mouth daily.   Cinnamon 500 MG capsule, Take 1,000 mg by mouth daily.   CRANBERRY PO, Take 2 tablets by mouth daily. 4200 mg   cyclobenzaprine (FLEXERIL) 10 MG tablet, Take 1 tablet (10 mg total) by mouth at bedtime.   cyclobenzaprine (FLEXERIL) 10 MG tablet, Take 1 tablet (10 mg total) by mouth at bedtime.  ELDERBERRY PO, Take 1 capsule by mouth daily.   estradiol (ESTRACE) 0.1 MG/GM vaginal cream, Place 1 Applicatorful vaginally every other day. In the evening   gabapentin (NEURONTIN) 100 MG capsule, Take 1 capsule (100 mg total) by mouth at bedtime.   Lancets (ONETOUCH DELICA PLUS [id number]) MISC, USE ONCE DAILY   Misc Natural Products (TART CHERRY ADVANCED) CAPS, Take 2 capsules by mouth daily.   Multiple Vitamins-Minerals (MULTIVITAMIN WITH MINERALS) tablet, Take 1 tablet by mouth daily. Centrum silver   ONETOUCH VERIO test strip, USE TWICE DAILY   OVER THE COUNTER MEDICATION, Take 1 tablet by mouth daily as needed (constipation). Vital Lax   polyethylene glycol (MIRALAX / GLYCOLAX) packet, Take 17 g by mouth daily as needed for mild constipation.   Turmeric 500 MG CAPS, Take 1,000 mg by mouth daily.    Reviewed prior external information including notes and imaging from  primary care provider As well as notes that were available from care everywhere and other healthcare systems.  Past medical history, social, surgical and family history all reviewed in electronic medical record.  No pertanent information unless stated regarding to the chief complaint.   Review of Systems:  No headache, visual changes, nausea, vomiting, diarrhea, constipation, dizziness, abdominal pain, skin rash, fevers, chills, night sweats,  weight loss, swollen lymph nodes, body aches, joint swelling, chest pain, shortness of breath, mood changes. POSITIVE muscle aches  Objective  Blood pressure 128/72, pulse 98, height _0  (1.6 m), weight 144 lb (65.3 kg), SpO2 (!) 89 %.   General: No apparent distress alert and oriented x3 mood and affect normal, dressed appropriately.  HEENT: Pupils equal, extraocular movements intact  Respiratory: Patient's speak in full sentences and does not appear short of breath  Cardiovascular: No lower extremity edema, non tender, no erythema  Gait antalgic gait MSK: Deferred knee exam today. Patient does have a significant Tinel's on the right side.  No significant weakness.  Patient does have mild thenar eminence wasting but contralateral side has about the same.  Neurovascularly intact noted.  Procedure: Real-time Ultrasound Guided Injection of right carpal tunnel Device: GE Logiq Q7  Ultrasound guided injection is preferred based studies that show increased duration, increased effect, greater accuracy, decreased procedural pain, increased response rate with ultrasound guided versus blind injection.  Verbal informed consent obtained.  Time-out conducted.  Noted no overlying erythema, induration, or other signs of local infection.  Skin prepped in a sterile fashion.  Local anesthesia: Topical Ethyl chloride.  With sterile technique and under real time ultrasound guidance:  median nerve visualized.  23g 5/8 inch needle inserted distal to proximal approach into nerve sheath. Pictures taken nfor needle placement. Patient did have injection of 0.5 cc of 0.5% Marcaine, and 0.5 cc of Kenalog 40 mg/dL. Completed without difficulty  Pain immediately resolved suggesting accurate placement of the medication.  Advised to call if fevers/chills, erythema, induration, drainage, or persistent bleeding.  Impression: Technically successful ultrasound guided injection.    Impression and Recommendations:    The  above documentation has been reviewed and is accurate and complete [name], DO

## 2021-10-24 ENCOUNTER — Encounter: Payer: Self-pay | Admitting: Family Medicine

## 2021-10-24 ENCOUNTER — Other Ambulatory Visit: Payer: Self-pay

## 2021-10-24 ENCOUNTER — Ambulatory Visit: Payer: Self-pay

## 2021-10-24 ENCOUNTER — Telehealth: Payer: Self-pay | Admitting: Adult Health

## 2021-10-24 ENCOUNTER — Ambulatory Visit (INDEPENDENT_AMBULATORY_CARE_PROVIDER_SITE_OTHER): Payer: Medicare Other | Admitting: Family Medicine

## 2021-10-24 VITALS — BP 128/72 | HR 98 | Ht 63.0 in | Wt 144.0 lb

## 2021-10-24 DIAGNOSIS — M170 Bilateral primary osteoarthritis of knee: Secondary | ICD-10-CM

## 2021-10-24 DIAGNOSIS — G5603 Carpal tunnel syndrome, bilateral upper limbs: Secondary | ICD-10-CM | POA: Diagnosis not present

## 2021-10-24 NOTE — Telephone Encounter (Signed)
Pt advised that this RN will need to know her symptoms before consideration of next steps; advised that she will likely need to be seen by a provider before medication is prescribed. Pt stated "that's alright" & disconnected the call.

## 2021-10-24 NOTE — Assessment & Plan Note (Signed)
Known arthritic changes.  Patient is due for viscosupplementation again in February.  We will set patient up on the February 5 date to try to have this approved and will go through.  For worsening pain patient was can come back for potential steroid injections.

## 2021-10-24 NOTE — Telephone Encounter (Signed)
Pt call and stated she want [name] to call her in something for her sinus at  [facility] [address] [place] [facility] [place] Phone:  [phone]  Fax:  [phone]

## 2021-10-24 NOTE — Patient Instructions (Signed)
See me again on 12/28/2021 for gel injection

## 2021-10-24 NOTE — Assessment & Plan Note (Signed)
Patient given injection and tolerated the procedure well.  Discussed icing regimen and home exercises.  Increase activity patient knows about bracing otherwise.  Follow-up with me again on an as-needed basis for this problem.

## 2021-10-24 NOTE — Telephone Encounter (Signed)
LVM instructions for pt to call office to be triaged for symptoms & next steps.

## 2021-11-26 ENCOUNTER — Telehealth: Payer: Self-pay | Admitting: *Deleted

## 2021-11-26 ENCOUNTER — Other Ambulatory Visit: Payer: Self-pay

## 2021-11-26 DIAGNOSIS — M5416 Radiculopathy, lumbar region: Secondary | ICD-10-CM

## 2021-11-26 NOTE — Telephone Encounter (Signed)
Pt called & would like an order for an epidural sent to [facility].

## 2021-11-26 NOTE — Telephone Encounter (Signed)
Left message for patient. Epidural ordered.

## 2021-11-27 ENCOUNTER — Ambulatory Visit (INDEPENDENT_AMBULATORY_CARE_PROVIDER_SITE_OTHER): Payer: Commercial Managed Care - HMO | Admitting: Adult Health

## 2021-11-27 ENCOUNTER — Encounter: Payer: Self-pay | Admitting: Adult Health

## 2021-11-27 VITALS — BP 110/72 | HR 75 | Temp 97.5°F | Ht 63.0 in | Wt 142.0 lb

## 2021-11-27 DIAGNOSIS — R350 Frequency of micturition: Secondary | ICD-10-CM | POA: Diagnosis not present

## 2021-11-27 DIAGNOSIS — N3000 Acute cystitis without hematuria: Secondary | ICD-10-CM

## 2021-11-27 LAB — POCT URINALYSIS DIPSTICK
Bilirubin, UA: NEGATIVE
Blood, UA: NEGATIVE
Glucose, UA: NEGATIVE
Ketones, UA: NEGATIVE
Nitrite, UA: POSITIVE
Protein, UA: NEGATIVE
Spec Grav, UA: 1.015 (ref 1.010–1.025)
Urobilinogen, UA: 0.2 E.U./dL
pH, UA: 7 (ref 5.0–8.0)

## 2021-11-27 MED ORDER — NITROFURANTOIN MONOHYD MACRO 100 MG PO CAPS: 100.0000 mg | ORAL_CAPSULE | Freq: Two times a day (BID) | ORAL | 0 refills | Status: DC

## 2021-11-27 MED ORDER — FLUTICASONE PROPIONATE 50 MCG/ACT NA SUSP: NASAL | 2 refills | Status: DC

## 2021-11-27 NOTE — Assessment & Plan Note (Signed)
Left-sided was injected with here in November.  Patient responded relatively well.  We discussed the possibility of the numbness continuing for some time.  Patient will come back again for the contralateral side in the next several weeks.  Patient wants to avoid surgical intervention at this time

## 2021-11-27 NOTE — Progress Notes (Signed)
Subjective:    Patient ID: Cathy [name], female    DOB: 11-19-1946, 76 y.o.   MRN: [id number]  HPI 76 year old female who  has a past medical history of Chronic constipation, DJD (degenerative joint disease), Female cystocele, GERD (gastroesophageal reflux disease), History of esophagitis, History of left inguinal hernia, History of MRSA infection, History of recurrent UTIs, Hyperlipidemia, Hypertension, Pre-diabetes, and Urgency of urination.  She presents to the office today for concern of UTI   She reports that she has had frequent urination with urgency and dysuria and lower pelvic pressure.  Her symptoms started about a week ago. She denies fevers, chills, or back pain past baseline  Review of Systems See HPI   Past Medical History:  Diagnosis Date   Chronic constipation    DJD (degenerative joint disease)    knees   Female cystocele    GERD (gastroesophageal reflux disease)    History of esophagitis    History of left inguinal hernia    History of MRSA infection    recurrent carbuncle   History of recurrent UTIs    Hyperlipidemia    Hypertension    Pre-diabetes    diet controlled   Urgency of urination     Social History   Socioeconomic History   Marital status: Single    Spouse name: Not on file   Number of children: Not on file   Years of education: Not on file   Highest education level: Not on file  Occupational History   Not on file  Tobacco Use   Smoking status: Former    Years: 1.00    Types: Cigarettes    Quit date: 02/28/1996    Years since quitting: 25.7   Smokeless tobacco: Never  Vaping Use   Vaping Use: Never used  Substance and Sexual Activity   Alcohol use: Yes    Alcohol/week: 4.0 standard drinks    Types: 4 Standard drinks or equivalent per week    Comment: on weekends   Drug use: No   Sexual activity: Not on file  Other Topics Concern   Not on file  Social History Narrative   Single   Former Smoker  -  quit 10 to 11 years ago  (light smoker)   Alcohol use-yes     2 children    Occupation: [employer]    Social Determinants of Health   Financial Resource Strain: Not on file  Food Insecurity: Not on file  Transportation Needs: Not on file  Physical Activity: Not on file  Stress: Not on file  Social Connections: Not on file  Intimate Partner Violence: Not on file    Past Surgical History:  Procedure Laterality Date   APPENDECTOMY  1980's   BREAST EXCISIONAL BIOPSY Left 1989   COLONOSCOPY  last one 06-18-2015   INGUINAL HERNIA REPAIR Left 05-10-2001   LAPAROSCOPIC LYSIS OF ADHESIONS  01/17/2019   OOPHORECTOMY Right 01/17/2019   PERINEOPLASTY  01/17/2019   SALPINGECTOMY Left 01/17/2019   TOTAL HIP ARTHROPLASTY Right 04/09/2020   Procedure: RIGH TOTAL HIP ARTHROPLASTY ANTERIOR APPROACH;  Surgeon: Melrose Nakayama, MD;  Location: [facility];  Service: Orthopedics;  Laterality: Right;   TOTAL HIP ARTHROPLASTY Left 01/28/2021   Procedure: LEFT TOTAL HIP ARTHROPLASTY ANTERIOR APPROACH;  Surgeon: Melrose Nakayama, MD;  Location: [facility];  Service: Orthopedics;  Laterality: Left;   VAGINAL HYSTERECTOMY  1980's   VAGINAL PROLAPSE REPAIR N/A 03/02/2016   Procedure: COLOPLAST ANTERIOR  VAULT  REPAIR WITH AXIS DERMIS. SACROSPINUS FIXATION, AUGMENTATION WITH AXIS DERMIS;  Surgeon: Carolan Clines, MD;  Location: [facility];  Service: Urology;  Laterality: N/A;    Family History  Problem Relation Age of Onset   Aneurysm Mother 34       deceased secondary to brain aneurysm   Liver cancer Father 47       deceased   Diabetes Other        grandmother   Colon cancer Neg Hx    Stomach cancer Neg Hx    Rectal cancer Neg Hx    Esophageal cancer Neg Hx    Breast cancer Neg Hx     Allergies  Allergen Reactions   Penicillins Hives    Has patient had a PCN reaction causing immediate rash, facial/tongue/throat swelling, SOB or lightheadedness with hypotension: Yes Has patient had a PCN reaction  causing severe rash involving mucus membranes or skin necrosis: No Has patient had a PCN reaction that required hospitalization: No Has patient had a PCN reaction occurring within the last 10 years: No If all of the above answers are "NO", then may proceed with Cephalosporin use.     Current Outpatient Medications on File Prior to Visit  Medication Sig Dispense Refill   Ascorbic Acid (VITAMIN C) 1000 MG tablet Take 1,000 mg by mouth daily.     aspirin 81 MG tablet Take 1 tablet (81 mg total) by mouth 2 (two) times daily after a meal. (Patient taking differently: Take 81 mg by mouth daily.) 60 tablet 0   b complex vitamins capsule Take 1 capsule by mouth daily.     Biotin 1000 MCG tablet Take 1,000 mcg by mouth daily.     Blood Glucose Monitoring Suppl (ONETOUCH VERIO FLEX SYSTEM) w/Device KIT TEST TWICE DAILY 1 kit 0   Calcium Carbonate-Vitamin D 600-400 MG-UNIT tablet Take 1 tablet by mouth daily.     Chromium 1000 MCG TABS Take 1,000 mcg by mouth daily.     Cinnamon 500 MG capsule Take 1,000 mg by mouth daily.     CRANBERRY PO Take 2 tablets by mouth daily. 4200 mg     Cyanocobalamin (VITAMIN B-12) 2500 MCG SUBL Place 2,500 mcg under the tongue daily.     cyclobenzaprine (FLEXERIL) 10 MG tablet Take 1 tablet (10 mg total) by mouth at bedtime. 30 tablet 0   cyclobenzaprine (FLEXERIL) 10 MG tablet Take 1 tablet (10 mg total) by mouth at bedtime. 30 tablet 0   ELDERBERRY PO Take 1 capsule by mouth daily.     estradiol (ESTRACE) 0.1 MG/GM vaginal cream Place 1 Applicatorful vaginally every other day. In the evening  3   fexofenadine (ALLEGRA) 180 MG tablet Take 180 mg by mouth daily.     fluticasone (FLONASE) 50 MCG/ACT nasal spray SHAKE LIQUID AND USE 2 SPRAYS IN EACH NOSTRIL DAILY 48 g 2   gabapentin (NEURONTIN) 100 MG capsule Take 1 capsule (100 mg total) by mouth at bedtime. 90 capsule 0   Lancets (ONETOUCH DELICA PLUS [id number]) MISC USE ONCE DAILY 100 each 3   lisinopril (ZESTRIL) 10  MG tablet TAKE 1 TABLET(10 MG) BY MOUTH DAILY 90 tablet 3   Misc Natural Products (TART CHERRY ADVANCED) CAPS Take 2 capsules by mouth daily.     Multiple Vitamins-Minerals (MULTIVITAMIN WITH MINERALS) tablet Take 1 tablet by mouth daily. Centrum silver     ONETOUCH VERIO test strip USE TWICE DAILY 200 strip 3   OVER THE COUNTER MEDICATION  Take 1 tablet by mouth daily as needed (constipation). Vital Lax     polyethylene glycol (MIRALAX / GLYCOLAX) packet Take 17 g by mouth daily as needed for mild constipation.     rosuvastatin (CRESTOR) 40 MG tablet TAKE 1 TABLET(40 MG) BY MOUTH DAILY 90 tablet 1   Turmeric 500 MG CAPS Take 1,000 mg by mouth daily.      [DISCONTINUED] valsartan (DIOVAN) 160 MG tablet Take 1/2 tablet by mouth once daily 30 tablet 5   No current facility-administered medications on file prior to visit.    BP 110/72    Pulse 75    Temp (!) 97.5 F (36.4 C) (Oral)    Ht 5' 3"  (1.6 m)    Wt 142 lb (64.4 kg)    SpO2 100%    BMI 25.15 kg/m       Objective:   Physical Exam Vitals and nursing note reviewed.  Constitutional:      Appearance: Normal appearance.  Cardiovascular:     Rate and Rhythm: Normal rate and regular rhythm.     Pulses: Normal pulses.     Heart sounds: Normal heart sounds.  Pulmonary:     Effort: Pulmonary effort is normal.     Breath sounds: Normal breath sounds.  Musculoskeletal:        General: Normal range of motion.  Skin:    General: Skin is warm and dry.     Capillary Refill: Capillary refill takes less than 2 seconds.  Neurological:     General: No focal deficit present.     Mental Status: She is alert and oriented to person, place, and time.  Psychiatric:        Mood and Affect: Mood normal.        Behavior: Behavior normal.        Thought Content: Thought content normal.        Judgment: Judgment normal.      Assessment & Plan:  1. Acute cystitis without hematuria - POC Urinalysis Dipstick + leuks and nitrites. Will start her on  Marobid  - Culture, Urine - nitrofurantoin, macrocrystal-monohydrate, (MACROBID) 100 MG capsule; Take 1 capsule (100 mg total) by mouth 2 (two) times daily.  Dispense: 10 capsule; Refill: 0  [name], NP

## 2021-11-28 DIAGNOSIS — Z0100 Encounter for examination of eyes and vision without abnormal findings: Secondary | ICD-10-CM | POA: Diagnosis not present

## 2021-11-30 LAB — URINE CULTURE
MICRO NUMBER:: 12831866
SPECIMEN QUALITY:: ADEQUATE

## 2021-12-02 ENCOUNTER — Telehealth: Payer: Self-pay | Admitting: Family Medicine

## 2021-12-02 NOTE — Telephone Encounter (Signed)
Pt will be scheduling epidural soon, needs to be off her aspirin. She is requesting something to help her with her back pain in the interim.  She states she is currently taking nothing for pain, cannot take gabapentin.

## 2021-12-03 NOTE — Telephone Encounter (Signed)
Can take tylenol 650 mg 3 times daily  Can do a little dose of tramadol if needed but can cause drowsiness and make more likely to fall

## 2021-12-04 ENCOUNTER — Telehealth: Payer: Self-pay | Admitting: Adult Health

## 2021-12-04 NOTE — Telephone Encounter (Signed)
Left message for patient to call back  

## 2021-12-04 NOTE — Telephone Encounter (Signed)
Left message for patient to call back and schedule Medicare Annual Wellness Visit (AWV) either virtually or in office. Left  my Cathy [name] number [phone]   Last AWV 06/10/20  please schedule at anytime with [facility] Nurse Health Advisor 1 or 2   This should be a 45 minute visit.

## 2021-12-09 ENCOUNTER — Ambulatory Visit
Admission: RE | Admit: 2021-12-09 | Discharge: 2021-12-09 | Disposition: A | Discharge status: Home / Self Care | Payer: Commercial Managed Care - HMO | Source: Ambulatory Visit | Attending: Family Medicine | Admitting: Family Medicine

## 2021-12-09 ENCOUNTER — Other Ambulatory Visit: Payer: Self-pay

## 2021-12-09 ENCOUNTER — Other Ambulatory Visit: Payer: Self-pay | Admitting: Adult Health

## 2021-12-09 DIAGNOSIS — M5416 Radiculopathy, lumbar region: Secondary | ICD-10-CM

## 2021-12-09 DIAGNOSIS — M4727 Other spondylosis with radiculopathy, lumbosacral region: Secondary | ICD-10-CM | POA: Diagnosis not present

## 2021-12-09 MED ORDER — IOPAMIDOL (ISOVUE-M 200) INJECTION 41%
1.0000 mL | Freq: Once | INTRAMUSCULAR | Status: AC
  Administered 2021-12-09: 1 mL via EPIDURAL

## 2021-12-09 MED ORDER — CIPROFLOXACIN HCL 500 MG PO TABS: 500.0000 mg | ORAL_TABLET | Freq: Two times a day (BID) | ORAL | 0 refills | Status: AC

## 2021-12-09 MED ORDER — METHYLPREDNISOLONE ACETATE 40 MG/ML INJ SUSP (RADIOLOG
80.0000 mg | Freq: Once | INTRAMUSCULAR | Status: AC
  Administered 2021-12-09: 80 mg via EPIDURAL

## 2021-12-09 NOTE — Discharge Instructions (Signed)

## 2021-12-24 ENCOUNTER — Encounter: Payer: Self-pay | Admitting: Adult Health

## 2021-12-24 ENCOUNTER — Telehealth (INDEPENDENT_AMBULATORY_CARE_PROVIDER_SITE_OTHER): Payer: Medicare Other | Admitting: Adult Health

## 2021-12-24 VITALS — Temp 99.1°F | Ht 63.0 in | Wt 142.0 lb

## 2021-12-24 DIAGNOSIS — J069 Acute upper respiratory infection, unspecified: Secondary | ICD-10-CM | POA: Diagnosis not present

## 2021-12-24 MED ORDER — HYDROCODONE BIT-HOMATROP MBR 5-1.5 MG/5ML PO SOLN: 5.0000 mL | Freq: Three times a day (TID) | ORAL | 0 refills | Status: DC | PRN

## 2021-12-24 MED ORDER — BENZONATATE 200 MG PO CAPS: 200.0000 mg | ORAL_CAPSULE | Freq: Two times a day (BID) | ORAL | 0 refills | Status: DC | PRN

## 2021-12-24 NOTE — Progress Notes (Signed)
Virtual Visit via Telephone Note  I connected with [name] on 12/24/21 at  3:00 PM EST by telephone and verified that I am speaking with the correct person using two identifiers.   I discussed the limitations, risks, security and privacy concerns of performing an evaluation and management service by telephone and the availability of in person appointments. I also discussed with the patient that there may be a patient responsible charge related to this service. The patient expressed understanding and agreed to proceed.  Location patient: home Location provider: work or home office Participants present for the call: patient, provider Patient did not have a visit in the prior 7 days to address this/these issue(s).   History of Present Illness: 76 year old female who  has a past medical history of Chronic constipation, DJD (degenerative joint disease), Female cystocele, GERD (gastroesophageal reflux disease), History of esophagitis, History of left inguinal hernia, History of MRSA infection, History of recurrent UTIs, Hyperlipidemia, Hypertension, Pre-diabetes, and Urgency of urination.  She is being evaluated today for an acute issue. Her symptoms include that of semi productive cough with yellow sputum. Associated symptoms include sinus congestion x 3 days.  She has not had any fevers,chills, shortness of wheezing.   At home she has been using Coricidin and Flonase without relief.   She took too covid tests this week both with negative results.    Cough has been keeping her up at night     Observations/Objective: Patient sounds cheerful and well on the phone. I do not appreciate any SOB. Dry cough present  Speech and thought processing are grossly intact. Patient reported vitals:  Assessment and Plan: 1. Viral upper respiratory tract infection with cough - Likely viral. Will prescribe Hycodan cough syrup at night and Tessalon pearls in the day time.  - Rest and stay  hydrated - Follow up in one week if symptoms not improving or sooner if symptoms worsen and she develops as fever  - HYDROcodone bit-homatropine (HYCODAN) 5-1.5 MG/5ML syrup; Take 5 mLs by mouth every 8 (eight) hours as needed for cough.  Dispense: 120 mL; Refill: 0 - benzonatate (TESSALON) 200 MG capsule; Take 1 capsule (200 mg total) by mouth 2 (two) times daily as needed for cough.  Dispense: 20 capsule; Refill: 0   Follow Up Instructions:  DIAGMED@   99441 5-10 [id number] 11-20 [id number] 21-30 I did not refer this patient for an OV in the next 24 hours for this/these issue(s).  I discussed the assessment and treatment plan with the patient. The patient was provided an opportunity to ask questions and all were answered. The patient agreed with the plan and demonstrated an understanding of the instructions.   The patient was advised to call back or seek an in-person evaluation if the symptoms worsen or if the condition fails to improve as anticipated.  I provided 12 minutes of non-face-to-face time during this encounter.   [name], NP

## 2021-12-24 NOTE — Progress Notes (Signed)
[name] [address] [place] Phone: [phone] Subjective:   [name], am serving as a scribe for Dr. Hulan Saas. This visit occurred during the SARS-CoV-2 public health emergency.  Safety protocols were in place, including screening questions prior to the visit, additional usage of staff PPE, and extensive cleaning of exam room while observing appropriate contact time as indicated for disinfecting solutions.   I'm seeing this patient by the request  of:  [name], NP  CC: bilateral knee pain   [id number]  10/24/2021 Known arthritic changes.  Patient is due for viscosupplementation again in February.  We will set patient up on the February 5 date to try to have this approved and will go through.  For worsening pain patient was can come back for potential steroid injections  Patient given injection and tolerated the procedure well.  Discussed icing regimen and home exercises.  Increase activity patient knows about bracing otherwise.  Follow-up with me again on an as-needed basis for this problem.  Update 12/30/2021 Cathy [name] is a 76 y.o. female coming in with complaint of B wrist and B knee pain. Patient approved for Durolane viscosupplementation injections. Patient states she is doing well. Here for her gel injections. Would like to know when she can get the injections in her wrist again. No  other complaints.      Past Medical History:  Diagnosis Date   Chronic constipation    DJD (degenerative joint disease)    knees   Female cystocele    GERD (gastroesophageal reflux disease)    History of esophagitis    History of left inguinal hernia    History of MRSA infection    recurrent carbuncle   History of recurrent UTIs    Hyperlipidemia    Hypertension    Pre-diabetes    diet controlled   Urgency of urination    Past Surgical History:  Procedure Laterality Date   APPENDECTOMY  1980's   BREAST  EXCISIONAL BIOPSY Left 1989   COLONOSCOPY  last one 06-18-2015   INGUINAL HERNIA REPAIR Left 05-10-2001   LAPAROSCOPIC LYSIS OF ADHESIONS  01/17/2019   OOPHORECTOMY Right 01/17/2019   PERINEOPLASTY  01/17/2019   SALPINGECTOMY Left 01/17/2019   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 SACROSPINUS FIXATION, AUGMENTATION WITH AXIS DERMIS;  Surgeon: Carolan Clines, MD;  Location: [facility];  Service: Urology;  Laterality: N/A;   Social History   Socioeconomic History   Marital status: Single    Spouse name: Not on file   Number of children: Not on file   Years of education: Not on file   Highest education level: Not on file  Occupational History   Not on file  Tobacco Use   Smoking status: Former    Years: 1.00    Types: Cigarettes    Quit date: 02/28/1996    Years since quitting: 25.8   Smokeless tobacco: Never  Vaping Use   Vaping Use: Never used  Substance and Sexual Activity  Alcohol use: Yes    Alcohol/week: 4.0 standard drinks    Types: 4 Standard drinks or equivalent per week    Comment: on weekends   Drug use: No   Sexual activity: Not on file  Other Topics Concern   Not on file  Social History Narrative   Single   Former Smoker  -  quit 10 to 11 years ago (light smoker)   Alcohol use-yes     2 children    Occupation: [employer]    Social Determinants of Health   Financial Resource Strain: Not on file  Food Insecurity: Not on file  Transportation Needs:  Not on file  Physical Activity: Not on file  Stress: Not on file  Social Connections: Not on file   Allergies  Allergen Reactions   Penicillins Hives    Has patient had a PCN reaction causing immediate rash, facial/tongue/throat swelling, SOB or lightheadedness with hypotension: Yes Has patient had a PCN reaction causing severe rash involving mucus membranes or skin necrosis: No Has patient had a PCN reaction that required hospitalization: No Has patient had a PCN reaction occurring within the last 10 years: No If all of the above answers are "NO", then may proceed with Cephalosporin use.    Family History  Problem Relation Age of Onset   Aneurysm Mother 1       deceased secondary to brain aneurysm   Liver cancer Father 63       deceased   Diabetes Other        grandmother   Colon cancer Neg Hx    Stomach cancer Neg Hx    Rectal cancer Neg Hx    Esophageal cancer Neg Hx    Breast cancer Neg Hx      Current Outpatient Medications (Cardiovascular):    lisinopril (ZESTRIL) 10 MG tablet, TAKE 1 TABLET(10 MG) BY MOUTH DAILY   rosuvastatin (CRESTOR) 40 MG tablet, TAKE 1 TABLET(40 MG) BY MOUTH DAILY  Current Outpatient Medications (Respiratory):    benzonatate (TESSALON) 200 MG capsule, Take 1 capsule (200 mg total) by mouth 2 (two) times daily as needed for cough.   fexofenadine (ALLEGRA) 180 MG tablet, Take 180 mg by mouth daily.   fluticasone (FLONASE) 50 MCG/ACT nasal spray, SHAKE LIQUID AND USE 2 SPRAYS IN EACH NOSTRIL DAILY   HYDROcodone bit-homatropine (HYCODAN) 5-1.5 MG/5ML syrup, Take 5 mLs by mouth every 8 (eight) hours as needed for cough.  Current Outpatient Medications (Analgesics):    aspirin 81 MG tablet, Take 1 tablet (81 mg total) by mouth 2 (two) times daily after a meal. (Patient taking differently: Take 81 mg by mouth daily.)  Current Outpatient Medications (Hematological):    Cyanocobalamin (VITAMIN B-12) 2500 MCG SUBL, Place 2,500 mcg under the tongue  daily.  Current Outpatient Medications (Other):    Ascorbic Acid (VITAMIN C) 1000 MG tablet, Take 1,000 mg by mouth daily.   b complex vitamins capsule, Take 1 capsule by mouth daily.   Biotin 1000 MCG tablet, Take 1,000 mcg by mouth daily.   Blood Glucose Monitoring Suppl (ONETOUCH VERIO FLEX SYSTEM) w/Device KIT, TEST TWICE DAILY   Calcium Carbonate-Vitamin D 600-400 MG-UNIT tablet, Take 1 tablet by mouth daily.   Chromium 1000 MCG TABS, Take 1,000 mcg by mouth daily.   Cinnamon 500 MG capsule, Take 1,000 mg by mouth daily.   CRANBERRY PO, Take 2 tablets by mouth daily. 4200 mg   cyclobenzaprine (FLEXERIL) 10 MG tablet, Take 1 tablet (  10 mg total) by mouth at bedtime.   cyclobenzaprine (FLEXERIL) 10 MG tablet, Take 1 tablet (10 mg total) by mouth at bedtime.   ELDERBERRY PO, Take 1 capsule by mouth daily.   estradiol (ESTRACE) 0.1 MG/GM vaginal cream, Place 1 Applicatorful vaginally every other day. In the evening   gabapentin (NEURONTIN) 100 MG capsule, Take 1 capsule (100 mg total) by mouth at bedtime.   Lancets (ONETOUCH DELICA PLUS [id number]) MISC, USE ONCE DAILY   Misc Natural Products (TART CHERRY ADVANCED) CAPS, Take 2 capsules by mouth daily.   Multiple Vitamins-Minerals (MULTIVITAMIN WITH MINERALS) tablet, Take 1 tablet by mouth daily. Centrum silver   nitrofurantoin, macrocrystal-monohydrate, (MACROBID) 100 MG capsule, Take 1 capsule (100 mg total) by mouth 2 (two) times daily.   ONETOUCH VERIO test strip, USE TWICE DAILY   OVER THE COUNTER MEDICATION, Take 1 tablet by mouth daily as needed (constipation). Vital Lax   polyethylene glycol (MIRALAX / GLYCOLAX) packet, Take 17 g by mouth daily as needed for mild constipation.   Turmeric 500 MG CAPS, Take 1,000 mg by mouth daily.     Review of Systems:  No headache, visual changes, nausea, vomiting, diarrhea, constipation, dizziness, abdominal pain, skin rash, fevers, chills, night sweats, weight loss, swollen lymph nodes, body  aches, joint swelling, chest pain, shortness of breath, mood changes. POSITIVE muscle aches  Objective  Blood pressure 132/84, pulse 98, height 5' 3"  (1.6 m), weight 144 lb (65.3 kg), SpO2 96 %.   General: No apparent distress alert and oriented x3 mood and affect normal, dressed appropriately.  HEENT: Pupils equal, extraocular movements intact  Respiratory: Patient's speak in full sentences and does not appear short of breath  Cardiovascular: No lower extremity edema, non tender, no erythema  Gait antalgic  Arthritic changes noted.  The patient does have instability noted with valgus and varus force.  Patient does not like the last 10 degrees of flexion bilaterally.  After informed written and verbal consent, patient was seated on exam table. Right knee was prepped with alcohol swab and utilizing anterolateral approach, patient's right knee space was injected with 48 mg per 3 mL of Monovisc (sodium hyaluronate) in a prefilled syringe was injected easily into the knee through a 22-gauge needle..Patient tolerated the procedure well without immediate complications.  After informed written and verbal consent, patient was seated on exam table. Left knee was prepped with alcohol swab and utilizing anterolateral approach, patient's left knee space was injected with 48 mg per 3 mL of Monovisc (sodium hyaluronate) in a prefilled syringe was injected easily into the knee through a 22-gauge needle..Patient tolerated the procedure well without immediate complications.   Impression and Recommendations:     The above documentation has been reviewed and is accurate and complete Cathy Pulley, DO

## 2021-12-29 ENCOUNTER — Other Ambulatory Visit: Payer: Self-pay | Admitting: Adult Health

## 2021-12-30 ENCOUNTER — Other Ambulatory Visit: Payer: Self-pay

## 2021-12-30 ENCOUNTER — Encounter: Payer: Self-pay | Admitting: Family Medicine

## 2021-12-30 ENCOUNTER — Ambulatory Visit (INDEPENDENT_AMBULATORY_CARE_PROVIDER_SITE_OTHER): Payer: Medicare Other | Admitting: Family Medicine

## 2021-12-30 DIAGNOSIS — M170 Bilateral primary osteoarthritis of knee: Secondary | ICD-10-CM | POA: Diagnosis not present

## 2021-12-30 NOTE — Patient Instructions (Addendum)
Good to see you  Ice 20 minutes 2 times daily. Usually after activity and before bed. Stay active See me again in 2 months

## 2021-12-30 NOTE — Assessment & Plan Note (Signed)
Patient has severe arthritic changes of the knees.  Viscosupplementation given again today.  And tolerated the procedure well.  We could do potential PRP but hopefully this will give patient some benefit again.  Patient did have the hip replacements and is likely going to be released in the next couple weeks.  Follow-up with me again in 6 to 8 weeks

## 2021-12-31 ENCOUNTER — Telehealth: Payer: Self-pay

## 2021-12-31 NOTE — Telephone Encounter (Signed)
Agree 

## 2022-01-02 ENCOUNTER — Encounter: Payer: Self-pay | Admitting: Adult Health

## 2022-01-02 ENCOUNTER — Ambulatory Visit (INDEPENDENT_AMBULATORY_CARE_PROVIDER_SITE_OTHER): Payer: Medicare Other | Admitting: Adult Health

## 2022-01-02 VITALS — BP 140/70 | HR 75 | Temp 98.6°F

## 2022-01-02 DIAGNOSIS — T148XXA Other injury of unspecified body region, initial encounter: Secondary | ICD-10-CM | POA: Diagnosis not present

## 2022-01-02 MED ORDER — METHYLPREDNISOLONE 4 MG PO TBPK: ORAL_TABLET | ORAL | 0 refills | Status: DC

## 2022-01-02 MED ORDER — LISINOPRIL 10 MG PO TABS: ORAL_TABLET | ORAL | 3 refills | Status: DC

## 2022-01-02 MED ORDER — CYCLOBENZAPRINE HCL 10 MG PO TABS: 10.0000 mg | ORAL_TABLET | Freq: Three times a day (TID) | ORAL | 0 refills | Status: DC | PRN

## 2022-01-02 NOTE — Progress Notes (Signed)
Subjective:    Patient ID: Cathy [name], female    DOB: 18-Aug-1946, 76 y.o.   MRN: [id number]  HPI 76 year old female who  has a past medical history of Chronic constipation, DJD (degenerative joint disease), Female cystocele, GERD (gastroesophageal reflux disease), History of esophagitis, History of left inguinal hernia, History of MRSA infection, History of recurrent UTIs, Hyperlipidemia, Hypertension, Pre-diabetes, and Urgency of urination.  She presents to the office today for an acute issue.  Reports that she woke up this morning with bilateral shoulder pain and neck stiffness.  Reports that she has trouble turning her head horizontally without pain.  Denies headaches, blurred vision, trauma or aggravating injury.   Review of Systems See HPI   Past Medical History:  Diagnosis Date   Chronic constipation    DJD (degenerative joint disease)    knees   Female cystocele    GERD (gastroesophageal reflux disease)    History of esophagitis    History of left inguinal hernia    History of MRSA infection    recurrent carbuncle   History of recurrent UTIs    Hyperlipidemia    Hypertension    Pre-diabetes    diet controlled   Urgency of urination     Social History   Socioeconomic History   Marital status: Single    Spouse name: Not on file   Number of children: Not on file   Years of education: Not on file   Highest education level: Not on file  Occupational History   Not on file  Tobacco Use   Smoking status: Former    Years: 1.00    Types: Cigarettes    Quit date: 02/28/1996    Years since quitting: 25.8   Smokeless tobacco: Never  Vaping Use   Vaping Use: Never used  Substance and Sexual Activity   Alcohol use: Yes    Alcohol/week: 4.0 standard drinks    Types: 4 Standard drinks or equivalent per week    Comment: on weekends   Drug use: No   Sexual activity: Not on file  Other Topics Concern   Not on file  Social History Narrative   Single   Former  Smoker  -  quit 10 to 11 years ago (light smoker)   Alcohol use-yes     2 children    Occupation: [employer]    Social Determinants of Health   Financial Resource Strain: Not on file  Food Insecurity: Not on file  Transportation Needs: Not on file  Physical Activity: Not on file  Stress: Not on file  Social Connections: Not on file  Intimate Partner Violence: Not on file    Past Surgical History:  Procedure Laterality Date   APPENDECTOMY  1980's   BREAST EXCISIONAL BIOPSY Left 1989   COLONOSCOPY  last one 06-18-2015   INGUINAL HERNIA REPAIR Left 05-10-2001   LAPAROSCOPIC LYSIS OF ADHESIONS  01/17/2019   OOPHORECTOMY Right 01/17/2019   PERINEOPLASTY  01/17/2019   SALPINGECTOMY Left 01/17/2019   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 SACROSPINUS FIXATION, AUGMENTATION WITH AXIS DERMIS;  Surgeon: Carolan Clines, MD;  Location: [facility];  Service: Urology;  Laterality: N/A;    Family History  Problem Relation Age of Onset   Aneurysm Mother 34       deceased secondary to brain aneurysm   Liver cancer Father 12       deceased   Diabetes Other        grandmother   Colon cancer Neg Hx    Stomach cancer Neg Hx    Rectal cancer Neg Hx    Esophageal cancer Neg Hx    Breast cancer Neg Hx     Allergies  Allergen Reactions   Penicillins Hives    Has patient had a PCN reaction causing immediate rash, facial/tongue/throat swelling, SOB or lightheadedness with hypotension:  Yes Has patient had a PCN reaction causing severe rash involving mucus membranes or skin necrosis: No Has patient had a PCN reaction that required hospitalization: No Has patient had a PCN reaction occurring within the last 10 years: No If all of the above answers are "NO", then may proceed with Cephalosporin use.     Current Outpatient Medications on File Prior to Visit  Medication Sig Dispense Refill   Ascorbic Acid (VITAMIN C) 1000 MG tablet Take 1,000 mg by mouth daily.     aspirin 81 MG tablet Take 1 tablet (81 mg total) by mouth 2 (two) times daily after a meal. (Patient taking differently: Take 81 mg by mouth daily.) 60 tablet 0   b complex vitamins capsule Take 1 capsule by mouth daily.     Biotin 1000 MCG tablet Take 1,000 mcg by mouth daily.     Blood Glucose Monitoring Suppl (ONETOUCH VERIO FLEX SYSTEM) w/Device KIT TEST TWICE DAILY 1 kit 0   Calcium Carbonate-Vitamin D 600-400 MG-UNIT tablet Take 1 tablet by mouth daily.     Chromium 1000 MCG TABS Take 1,000 mcg by mouth daily.     Cinnamon 500 MG capsule Take 1,000 mg by mouth daily.     CRANBERRY PO Take 2 tablets by mouth daily. 4200 mg     Cyanocobalamin (VITAMIN B-12) 2500 MCG SUBL Place 2,500 mcg under the tongue daily.     ELDERBERRY PO Take 1 capsule by mouth daily.     estradiol (ESTRACE) 0.1 MG/GM vaginal cream Place 1 Applicatorful vaginally every other day. In the evening  3   fexofenadine (ALLEGRA) 180 MG tablet Take 180 mg by mouth daily.     fluticasone (FLONASE) 50 MCG/ACT nasal spray SHAKE LIQUID AND USE 2 SPRAYS IN EACH NOSTRIL DAILY 48 g 2   gabapentin (NEURONTIN) 100 MG capsule Take 1 capsule (100 mg total) by mouth at bedtime. 90 capsule 0   Lancets (ONETOUCH DELICA PLUS [id number]) MISC USE ONCE DAILY 100 each 3   Misc Natural Products (TART CHERRY ADVANCED) CAPS Take 2 capsules by mouth daily.     Multiple Vitamins-Minerals (MULTIVITAMIN WITH MINERALS) tablet Take 1 tablet by mouth daily. Centrum silver      ONETOUCH VERIO test strip USE TWICE DAILY 200 strip 3   OVER THE COUNTER MEDICATION Take 1 tablet by mouth daily as needed (constipation). Vital Lax     polyethylene glycol (MIRALAX / GLYCOLAX) packet Take 17 g by mouth daily as needed for mild constipation.     rosuvastatin (CRESTOR) 40 MG tablet TAKE 1 TABLET(40 MG) BY MOUTH DAILY 90 tablet 1   Turmeric 500 MG  CAPS Take 1,000 mg by mouth daily.      [DISCONTINUED] valsartan (DIOVAN) 160 MG tablet Take 1/2 tablet by mouth once daily 30 tablet 5   No current facility-administered medications on file prior to visit.    BP 140/70    Pulse 75    Temp 98.6 F (37 C)    SpO2 97%       Objective:   Physical Exam Vitals and nursing note reviewed.  Constitutional:      Appearance: Normal appearance.  Musculoskeletal:        General: Tenderness present.     Right shoulder: Tenderness present. No bony tenderness or crepitus. Normal range of motion. Normal strength.     Left shoulder: Tenderness present. No bony tenderness or crepitus. Normal range of motion. Normal strength.     Cervical back: Decreased range of motion.     Comments: She does have tenderness with palpation throughout the trapezius muscle groups.  Has limited range of motion when looking towards the left and right shoulder.  Skin:    General: Skin is warm and dry.     Capillary Refill: Capillary refill takes less than 2 seconds.  Neurological:     General: No focal deficit present.     Mental Status: She is alert and oriented to person, place, and time.  Psychiatric:        Mood and Affect: Mood normal.        Behavior: Behavior normal.        Thought Content: Thought content normal.        Judgment: Judgment normal.      Assessment & Plan:  1. Muscle strain  - cyclobenzaprine (FLEXERIL) 10 MG tablet; Take 1 tablet (10 mg total) by mouth 3 (three) times daily as needed for muscle spasms.  Dispense: 30 tablet; Refill: 0 - methylPREDNISolone (MEDROL DOSEPAK) 4 MG  TBPK tablet; Take as directed  Dispense: 21 tablet; Refill: 0  [name], NP

## 2022-01-19 ENCOUNTER — Telehealth: Payer: Self-pay | Admitting: Adult Health

## 2022-01-19 NOTE — Telephone Encounter (Signed)
Left message for patient to call back and schedule Medicare Annual Wellness Visit (AWV) either virtually or in office. Left  my Cathy [name] number [phone]   Last AWV 06/10/20  please schedule at anytime with [facility] Nurse Health Advisor 1 or 2   This should be a 45 minute visit.

## 2022-01-22 NOTE — Progress Notes (Signed)
[place] Phone: [phone] Subjective:   [name], am serving as a scribe for Dr. Hulan Saas.  This visit occurred during the SARS-CoV-2 public health emergency.  Safety protocols were in place, including screening questions prior to the visit, additional usage of staff PPE, and extensive cleaning of exam room while observing appropriate contact time as indicated for disinfecting solutions.  I'm seeing this patient by the request  of:  [name], NP  CC: Bilateral hand pain  JOI:[id number]  12/30/2021 Patient has severe arthritic changes of the knees.  Viscosupplementation given again today.  And tolerated the procedure well.  We could do potential PRP but hopefully this will give patient some benefit again.  Patient did have the hip replacements and is likely going to be released in the next couple weeks.  Follow-up with me again in 6 to 8 weeks  Update 01/27/2022 Cathy [name] is a 76 y.o. female coming in with complaint of B knee, back and B wrist pain. 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 Would like injections in both wrists.        Past Medical History:  Diagnosis Date   Chronic constipation    DJD (degenerative joint disease)    knees   Female cystocele    GERD (gastroesophageal reflux disease)    History of esophagitis    History of left inguinal hernia    History of MRSA infection    recurrent carbuncle   History of recurrent UTIs    Hyperlipidemia    Hypertension    Pre-diabetes    diet controlled   Urgency of urination    Past Surgical History:  Procedure Laterality Date   APPENDECTOMY  1980's   BREAST EXCISIONAL BIOPSY Left 1989   COLONOSCOPY  last one 06-18-2015   INGUINAL HERNIA REPAIR Left 05-10-2001   LAPAROSCOPIC LYSIS OF ADHESIONS  01/17/2019   OOPHORECTOMY Right 01/17/2019   PERINEOPLASTY  01/17/2019   SALPINGECTOMY Left  01/17/2019   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 SACROSPINUS FIXATION, AUGMENTATION WITH AXIS DERMIS;  Surgeon: Carolan Clines, MD;  Location: [facility];  Service: Urology;  Laterality: N/A;   Social History   Socioeconomic History   Marital status: Single    Spouse name: Not on file   Number of children: Not on file   Years of education: Not on file   Highest education level: Not on file  Occupational History   Not on file  Tobacco Use   Smoking status: Former    Years: 1.00    Types: Cigarettes    Quit date: 02/28/1996    Years since quitting: 25.9   Smokeless tobacco: Never  Vaping Use   Vaping Use: Never used  Substance and Sexual Activity   Alcohol use: Yes    Alcohol/week: 4.0 standard drinks    Types: 4 Standard drinks or equivalent per week    Comment: on weekends   Drug  use: No   Sexual activity: Not on file  Other Topics Concern   Not on file  Social History Narrative   Single   Former Smoker  -  quit 10 to 11 years ago (light smoker)   Alcohol use-yes     2 children    Occupation: [employer]    Social Determinants of Health   Financial Resource Strain: Not on file  Food Insecurity: Not on file  Transportation Needs: Not on file  Physical Activity: Not on file  Stress: Not on file  Social Connections: Not on file   Allergies  Allergen Reactions   Penicillins Hives    Has patient had a PCN reaction causing immediate rash, facial/tongue/throat  swelling, SOB or lightheadedness with hypotension: Yes Has patient had a PCN reaction causing severe rash involving mucus membranes or skin necrosis: No Has patient had a PCN reaction that required hospitalization: No Has patient had a PCN reaction occurring within the last 10 years: No If all of the above answers are "NO", then may proceed with Cephalosporin use.    Family History  Problem Relation Age of Onset   Aneurysm Mother 23       deceased secondary to brain aneurysm   Liver cancer Father 81       deceased   Diabetes Other        grandmother   Colon cancer Neg Hx    Stomach cancer Neg Hx    Rectal cancer Neg Hx    Esophageal cancer Neg Hx    Breast cancer Neg Hx     Current Outpatient Medications (Endocrine & Metabolic):    methylPREDNISolone (MEDROL DOSEPAK) 4 MG TBPK tablet, Take as directed  Current Outpatient Medications (Cardiovascular):    lisinopril (ZESTRIL) 10 MG tablet, TAKE 1 TABLET(10 MG) BY MOUTH DAILY   rosuvastatin (CRESTOR) 40 MG tablet, TAKE 1 TABLET(40 MG) BY MOUTH DAILY  Current Outpatient Medications (Respiratory):    fexofenadine (ALLEGRA) 180 MG tablet, Take 180 mg by mouth daily.   fluticasone (FLONASE) 50 MCG/ACT nasal spray, SHAKE LIQUID AND USE 2 SPRAYS IN EACH NOSTRIL DAILY  Current Outpatient Medications (Analgesics):    aspirin 81 MG tablet, Take 1 tablet (81 mg total) by mouth 2 (two) times daily after a meal. (Patient taking differently: Take 81 mg by mouth daily.)  Current Outpatient Medications (Hematological):    Cyanocobalamin (VITAMIN B-12) 2500 MCG SUBL, Place 2,500 mcg under the tongue daily.  Current Outpatient Medications (Other):    Ascorbic Acid (VITAMIN C) 1000 MG tablet, Take 1,000 mg by mouth daily.   b complex vitamins capsule, Take 1 capsule by mouth daily.   Biotin 1000 MCG tablet, Take 1,000 mcg by mouth daily.   Blood Glucose Monitoring Suppl (ONETOUCH VERIO FLEX SYSTEM) w/Device KIT, TEST TWICE DAILY   Calcium  Carbonate-Vitamin D 600-400 MG-UNIT tablet, Take 1 tablet by mouth daily.   Chromium 1000 MCG TABS, Take 1,000 mcg by mouth daily.   Cinnamon 500 MG capsule, Take 1,000 mg by mouth daily.   CRANBERRY PO, Take 2 tablets by mouth daily. 4200 mg   cyclobenzaprine (FLEXERIL) 10 MG tablet, Take 1 tablet (10 mg total) by mouth 3 (three) times daily as needed for muscle spasms.   ELDERBERRY PO, Take 1 capsule by mouth daily.   estradiol (ESTRACE) 0.1 MG/GM vaginal cream, Place 1 Applicatorful vaginally every other day. In the evening   gabapentin (NEURONTIN) 100 MG capsule, Take 1 capsule (100 mg total)  by mouth at bedtime.   Lancets (ONETOUCH DELICA PLUS ALPFXT02I) MISC, USE ONCE DAILY   Misc Natural Products (TART CHERRY ADVANCED) CAPS, Take 2 capsules by mouth daily.   Multiple Vitamins-Minerals (MULTIVITAMIN WITH MINERALS) tablet, Take 1 tablet by mouth daily. Centrum silver   ONETOUCH VERIO test strip, USE TWICE DAILY   OVER THE COUNTER MEDICATION, Take 1 tablet by mouth daily as needed (constipation). Vital Lax   polyethylene glycol (MIRALAX / GLYCOLAX) packet, Take 17 g by mouth daily as needed for mild constipation.   Turmeric 500 MG CAPS, Take 1,000 mg by mouth daily.    Reviewed prior external information including notes and imaging from  primary care provider As well as notes that were available from care everywhere and other healthcare systems.  Past medical history, social, surgical and family history all reviewed in electronic medical record.  No pertanent information unless stated regarding to the chief complaint.   Review of Systems:  No headache, visual changes, nausea, vomiting, diarrhea, constipation, dizziness, abdominal pain, skin rash, fevers, chills, night sweats, weight loss, swollen lymph nodes, body aches, joint swelling, chest pain, shortness of breath, mood changes. POSITIVE muscle aches  Objective  Blood pressure 102/76, pulse 94, height 5' 3"  (1.6 m), weight 146 lb  (66.2 kg), SpO2 99 %.   General: No apparent distress alert and oriented x3 mood and affect normal, dressed appropriately.  HEENT: Pupils equal, extraocular movements intact  Respiratory: Patient's speak in full sentences and does not appear short of breath  Cardiovascular: No lower extremity edema, non tender, no erythema  Gait antalgic Right hand exam does have some tenderness to palpation especially with a positive Tinel's.  Mild thenar eminence wasting bilaterally.  Patient does have a positive Tinel's on the contralateral side as well.  \Procedure: Real-time Ultrasound Guided Injection of right carpal tunnel Device: GE Logiq Q7  Ultrasound guided injection is preferred based studies that show increased duration, increased effect, greater accuracy, decreased procedural pain, increased response rate with ultrasound guided versus blind injection.  Verbal informed consent obtained.  Time-out conducted.  Noted no overlying erythema, induration, or other signs of local infection.  Skin prepped in a sterile fashion.  Local anesthesia: Topical Ethyl chloride.  With sterile technique and under real time ultrasound guidance:  median nerve visualized.  23g 5/8 inch needle inserted distal to proximal approach into nerve sheath. Pictures taken nfor needle placement. Patient did have injection of  0.5 cc of 0.5% Marcaine, and 0.5 cc of Kenalog 40 mg/dL. Completed without difficulty  Pain immediately resolved suggesting accurate placement of the medication.  Advised to call if fevers/chills, erythema, induration, drainage, or persistent bleeding.  Impression: Technically successful ultrasound guided injection.  Procedure: Real-time Ultrasound Guided Injection of left carpal tunnel Device: GE Logiq Q7 Ultrasound guided injection is preferred based studies that show increased duration, increased effect, greater accuracy, decreased procedural pain, increased response rate with ultrasound guided versus  blind injection.  Verbal informed consent obtained.  Time-out conducted.  Noted no overlying erythema, induration, or other signs of local infection.  Skin prepped in a sterile fashion.  Local anesthesia: Topical Ethyl chloride.  With sterile technique and under real time ultrasound guidance:  median nerve visualized.  23g 5/8 inch needle inserted distal to proximal approach into nerve sheath.  Injected with 0.5 cc of 0.5% Marcaine, and 0.5 cc of Kenalog 40 mg/dL. Completed without difficulty  Pain immediately improved suggesting accurate placement of the medication.  Advised to call if fevers/chills, erythema,  induration, drainage, or persistent bleeding.  Images permanently stored and available for review in the ultrasound unit.  Impression: Technically successful ultrasound guided injection.    Impression and Recommendations:     The above documentation has been reviewed and is accurate and complete [name], DO

## 2022-01-26 DIAGNOSIS — M25552 Pain in left hip: Secondary | ICD-10-CM | POA: Diagnosis not present

## 2022-01-27 ENCOUNTER — Other Ambulatory Visit: Payer: Self-pay

## 2022-01-27 ENCOUNTER — Ambulatory Visit: Payer: Self-pay

## 2022-01-27 ENCOUNTER — Ambulatory Visit (INDEPENDENT_AMBULATORY_CARE_PROVIDER_SITE_OTHER): Payer: Medicare Other | Admitting: Family Medicine

## 2022-01-27 VITALS — BP 102/76 | HR 94 | Ht 63.0 in | Wt 146.0 lb

## 2022-01-27 DIAGNOSIS — G5603 Carpal tunnel syndrome, bilateral upper limbs: Secondary | ICD-10-CM

## 2022-01-27 DIAGNOSIS — M5416 Radiculopathy, lumbar region: Secondary | ICD-10-CM

## 2022-01-27 DIAGNOSIS — M79641 Pain in right hand: Secondary | ICD-10-CM

## 2022-01-27 NOTE — Patient Instructions (Signed)
Injected both wrists ?[facility] [phone] ?See me again in 3 months ?

## 2022-02-12 ENCOUNTER — Ambulatory Visit
Admission: RE | Admit: 2022-02-12 | Discharge: 2022-02-12 | Disposition: A | Discharge status: Home / Self Care | Payer: Medicare Other | Source: Ambulatory Visit | Attending: Family Medicine | Admitting: Family Medicine

## 2022-02-12 ENCOUNTER — Other Ambulatory Visit: Payer: Self-pay

## 2022-02-12 DIAGNOSIS — M5416 Radiculopathy, lumbar region: Secondary | ICD-10-CM

## 2022-02-12 DIAGNOSIS — M47817 Spondylosis without myelopathy or radiculopathy, lumbosacral region: Secondary | ICD-10-CM | POA: Diagnosis not present

## 2022-02-12 MED ORDER — METHYLPREDNISOLONE ACETATE 40 MG/ML INJ SUSP (RADIOLOG
80.0000 mg | Freq: Once | INTRAMUSCULAR | Status: AC
  Administered 2022-02-12: 80 mg via EPIDURAL

## 2022-02-12 MED ORDER — IOPAMIDOL (ISOVUE-M 200) INJECTION 41%
1.0000 mL | Freq: Once | INTRAMUSCULAR | Status: AC
  Administered 2022-02-12: 1 mL via EPIDURAL

## 2022-02-12 NOTE — Discharge Instructions (Signed)

## 2022-02-20 ENCOUNTER — Telehealth: Payer: Self-pay | Admitting: Adult Health

## 2022-02-20 NOTE — Telephone Encounter (Signed)
Patient called in stating that she's experiencing night sweats and would like to know what [name] advise her to do or take for it. ? ?Patient could be contacted at [phone]. ? ?Please advise. ?

## 2022-02-20 NOTE — Telephone Encounter (Signed)
Left message to return phone call.

## 2022-02-24 NOTE — Telephone Encounter (Signed)
Left message to return phone call. Called pt on both numbers. Will send out a letter.  ?

## 2022-02-24 NOTE — Telephone Encounter (Addendum)
Pt returned call and stated that a night under her neck she sweats a lot. Pt wants to know if it is hot flashes. If so pt is wanting to know if its something she can take OTC for it.  ?

## 2022-02-25 ENCOUNTER — Telehealth: Payer: Self-pay | Admitting: Adult Health

## 2022-02-25 NOTE — Telephone Encounter (Signed)
Left message to return phone call.

## 2022-02-25 NOTE — Telephone Encounter (Signed)
L/m for pt to return call

## 2022-02-25 NOTE — Telephone Encounter (Signed)
Left message for patient to call back and schedule Medicare Annual Wellness Visit (AWV) either virtually or in office. Left  my jabber number [phone]   Last AWV 06/10/20 ; please schedule at anytime with [facility] Nurse Health Advisor 1 or 2     

## 2022-02-25 NOTE — Telephone Encounter (Signed)
Patient notified of update  and verbalized understanding. 

## 2022-03-09 DIAGNOSIS — M21611 Bunion of right foot: Secondary | ICD-10-CM | POA: Diagnosis not present

## 2022-03-13 ENCOUNTER — Telehealth: Payer: Self-pay | Admitting: Adult Health

## 2022-03-13 ENCOUNTER — Telehealth: Payer: Self-pay

## 2022-03-13 ENCOUNTER — Other Ambulatory Visit: Payer: Self-pay

## 2022-03-13 DIAGNOSIS — T148XXA Other injury of unspecified body region, initial encounter: Secondary | ICD-10-CM

## 2022-03-13 MED ORDER — TIZANIDINE HCL 2 MG PO TABS: 2.0000 mg | ORAL_TABLET | Freq: Every day | ORAL | 0 refills | Status: DC

## 2022-03-13 MED ORDER — CYCLOBENZAPRINE HCL 10 MG PO TABS: 10.0000 mg | ORAL_TABLET | Freq: Three times a day (TID) | ORAL | 0 refills | Status: DC | PRN

## 2022-03-13 NOTE — Telephone Encounter (Signed)
Rx filled

## 2022-03-13 NOTE — Telephone Encounter (Signed)
Patient called stating that she ran out of her muscle relaxer that Tamala Julian prescribed and would like another refill sent to her pharmacy [facility] on [address]  ?

## 2022-03-13 NOTE — Telephone Encounter (Signed)
Ok to send in.  

## 2022-03-13 NOTE — Telephone Encounter (Signed)
Pt requesting muscle relaxers (cyclobenzaprine (FLEXERIL) 10 MG tablet) for her neck pain x 1 day.   ?[facility] [phone] - [address], [address] [place] Phone:  [phone]  ?Fax:  [phone]  ?  ? ?

## 2022-03-17 ENCOUNTER — Telehealth: Payer: Self-pay | Admitting: Adult Health

## 2022-03-17 NOTE — Telephone Encounter (Signed)
Left message for patient to call back and schedule Medicare Annual Wellness Visit (AWV).  ? ?Please offer to do virtually or by telephone.  Left office number. ? ?Last AWV:06/10/2020 ? ?Please schedule at anytime with Nurse Health Advisor. ?  ?

## 2022-03-20 ENCOUNTER — Ambulatory Visit (INDEPENDENT_AMBULATORY_CARE_PROVIDER_SITE_OTHER): Payer: Medicare Other | Admitting: Adult Health

## 2022-03-20 ENCOUNTER — Encounter: Payer: Self-pay | Admitting: Adult Health

## 2022-03-20 VITALS — BP 128/68 | HR 97 | Temp 97.7°F | Ht 63.0 in | Wt 147.0 lb

## 2022-03-20 DIAGNOSIS — R350 Frequency of micturition: Secondary | ICD-10-CM | POA: Diagnosis not present

## 2022-03-20 LAB — POCT URINALYSIS DIPSTICK
Bilirubin, UA: NEGATIVE
Blood, UA: NEGATIVE
Glucose, UA: NEGATIVE
Ketones, UA: NEGATIVE
Leukocytes, UA: NEGATIVE
Nitrite, UA: NEGATIVE
Protein, UA: NEGATIVE
Spec Grav, UA: 1.015 (ref 1.010–1.025)
Urobilinogen, UA: 0.2 E.U./dL
pH, UA: 7 (ref 5.0–8.0)

## 2022-03-20 NOTE — Progress Notes (Signed)
? ?Subjective:  ? ? Patient ID: Cathy [name], female    DOB: 1946/01/16, 75 y.o.   MRN: [id number] ? ?HPI ?76 year old female who  has a past medical history of Chronic constipation, DJD (degenerative joint disease), Female cystocele, GERD (gastroesophageal reflux disease), History of esophagitis, History of left inguinal hernia, History of MRSA infection, History of recurrent UTIs, Hyperlipidemia, Hypertension, Pre-diabetes, and Urgency of urination. ? ?She presents to the office today for an acute issue of abd pressure and frequent urination x 3 days. She denies dysuria, hematuria, urgency, constipation, diarrhea. She has one cup of coffee in the morning and then drinks tea throughout the day.  ? ? ?Review of Systems ?See HPI  ? ?Past Medical History:  ?Diagnosis Date  ? Chronic constipation   ? DJD (degenerative joint disease)   ? knees  ? Female cystocele   ? GERD (gastroesophageal reflux disease)   ? History of esophagitis   ? History of left inguinal hernia   ? History of MRSA infection   ? recurrent carbuncle  ? History of recurrent UTIs   ? Hyperlipidemia   ? Hypertension   ? Pre-diabetes   ? diet controlled  ? Urgency of urination   ? ? ?Social History  ? ?Socioeconomic History  ? Marital status: Single  ?  Spouse name: Not on file  ? Number of children: Not on file  ? Years of education: Not on file  ? Highest education level: Not on file  ?Occupational History  ? Not on file  ?Tobacco Use  ? Smoking status: Former  ?  Years: 1.00  ?  Types: Cigarettes  ?  Quit date: 02/28/1996  ?  Years since quitting: 26.0  ? Smokeless tobacco: Never  ?Vaping Use  ? Vaping Use: Never used  ?Substance and Sexual Activity  ? Alcohol use: Yes  ?  Alcohol/week: 4.0 standard drinks  ?  Types: 4 Standard drinks or equivalent per week  ?  Comment: on weekends  ? Drug use: No  ? Sexual activity: Not on file  ?Other Topics Concern  ? Not on file  ?Social History Narrative  ? Single  ? Former Smoker  -  quit 10 to 11 years ago  (light smoker)  ? Alcohol use-yes    ? 2 children   ? Occupation: [employer]   ? ?Social Determinants of Health  ? ?Financial Resource Strain: Not on file  ?Food Insecurity: Not on file  ?Transportation Needs: Not on file  ?Physical Activity: Not on file  ?Stress: Not on file  ?Social Connections: Not on file  ?Intimate Partner Violence: Not on file  ? ? ?Past Surgical History:  ?Procedure Laterality Date  ? APPENDECTOMY  1980's  ? BREAST EXCISIONAL BIOPSY Left 1989  ? COLONOSCOPY  last one 06-18-2015  ? INGUINAL HERNIA REPAIR Left 05-10-2001  ? LAPAROSCOPIC LYSIS OF ADHESIONS  01/17/2019  ? OOPHORECTOMY Right 01/17/2019  ? PERINEOPLASTY  01/17/2019  ? SALPINGECTOMY Left 01/17/2019  ? TOTAL HIP ARTHROPLASTY Right 04/09/2020  ? Procedure: RIGH TOTAL HIP ARTHROPLASTY ANTERIOR APPROACH;  Surgeon: Melrose Nakayama, MD;  Location: [facility];  Service: Orthopedics;  Laterality: Right;  ? TOTAL HIP ARTHROPLASTY Left 01/28/2021  ? Procedure: LEFT TOTAL HIP ARTHROPLASTY ANTERIOR APPROACH;  Surgeon: Melrose Nakayama, MD;  Location: [facility];  Service: Orthopedics;  Laterality: Left;  ? VAGINAL HYSTERECTOMY  1980's  ? VAGINAL PROLAPSE REPAIR N/A 03/02/2016  ? Procedure: COLOPLAST ANTERIOR  VAULT REPAIR  WITH AXIS DERMIS. SACROSPINUS FIXATION, AUGMENTATION WITH AXIS DERMIS;  Surgeon: Carolan Clines, MD;  Location[facility];  Service: Urology;  Laterality: N/A;  ? ? ?Family History  ?Problem Relation Age of Onset  ? Aneurysm Mother 9  ?     deceased secondary to brain aneurysm  ? Liver cancer Father 57  ?     deceased  ? Diabetes Other   ?     grandmother  ? Colon cancer Neg Hx   ? Stomach cancer Neg Hx   ? Rectal cancer Neg Hx   ? Esophageal cancer Neg Hx   ? Breast cancer Neg Hx   ? ? ?Allergies  ?Allergen Reactions  ? Penicillins Hives  ?  Has patient had a PCN reaction causing immediate rash, facial/tongue/throat swelling, SOB or lightheadedness with hypotension: Yes ?Has patient had a PCN reaction  causing severe rash involving mucus membranes or skin necrosis: No ?Has patient had a PCN reaction that required hospitalization: No ?Has patient had a PCN reaction occurring within the last 10 years: No ?If all of the above answers are "NO", then may proceed with Cephalosporin use. ?  ? ? ?Current Outpatient Medications on File Prior to Visit  ?Medication Sig Dispense Refill  ? Ascorbic Acid (VITAMIN C) 1000 MG tablet Take 1,000 mg by mouth daily.    ? aspirin 81 MG tablet Take 1 tablet (81 mg total) by mouth 2 (two) times daily after a meal. (Patient taking differently: Take 81 mg by mouth daily.) 60 tablet 0  ? b complex vitamins capsule Take 1 capsule by mouth daily.    ? Biotin 1000 MCG tablet Take 1,000 mcg by mouth daily.    ? Blood Glucose Monitoring Suppl (ONETOUCH VERIO FLEX SYSTEM) w/Device KIT TEST TWICE DAILY 1 kit 0  ? Calcium Carbonate-Vitamin D 600-400 MG-UNIT tablet Take 1 tablet by mouth daily.    ? Chromium 1000 MCG TABS Take 1,000 mcg by mouth daily.    ? Cinnamon 500 MG capsule Take 1,000 mg by mouth daily.    ? CRANBERRY PO Take 2 tablets by mouth daily. 4200 mg    ? Cyanocobalamin (VITAMIN B-12) 2500 MCG SUBL Place 2,500 mcg under the tongue daily.    ? cyclobenzaprine (FLEXERIL) 10 MG tablet Take 1 tablet (10 mg total) by mouth 3 (three) times daily as needed for muscle spasms. 30 tablet 0  ? ELDERBERRY PO Take 1 capsule by mouth daily.    ? estradiol (ESTRACE) 0.1 MG/GM vaginal cream Place 1 Applicatorful vaginally every other day. In the evening  3  ? fexofenadine (ALLEGRA) 180 MG tablet Take 180 mg by mouth daily.    ? fluticasone (FLONASE) 50 MCG/ACT nasal spray SHAKE LIQUID AND USE 2 SPRAYS IN EACH NOSTRIL DAILY 48 g 2  ? gabapentin (NEURONTIN) 100 MG capsule Take 1 capsule (100 mg total) by mouth at bedtime. 90 capsule 0  ? Lancets (ONETOUCH DELICA PLUS [id number]) MISC USE ONCE DAILY 100 each 3  ? lisinopril (ZESTRIL) 10 MG tablet TAKE 1 TABLET(10 MG) BY MOUTH DAILY 90 tablet 3  ? Misc  Natural Products (TART CHERRY ADVANCED) CAPS Take 2 capsules by mouth daily.    ? Multiple Vitamins-Minerals (MULTIVITAMIN WITH MINERALS) tablet Take 1 tablet by mouth daily. Centrum silver    ? ONETOUCH VERIO test strip USE TWICE DAILY 200 strip 3  ? OVER THE COUNTER MEDICATION Take 1 tablet by mouth daily as needed (constipation). Vital Lax    ?  polyethylene glycol (MIRALAX / GLYCOLAX) packet Take 17 g by mouth daily as needed for mild constipation.    ? rosuvastatin (CRESTOR) 40 MG tablet TAKE 1 TABLET(40 MG) BY MOUTH DAILY 90 tablet 1  ? tiZANidine (ZANAFLEX) 2 MG tablet Take 1 tablet (2 mg total) by mouth at bedtime. 30 tablet 0  ? Turmeric 500 MG CAPS Take 1,000 mg by mouth daily.     ? [DISCONTINUED] valsartan (DIOVAN) 160 MG tablet Take 1/2 tablet by mouth once daily 30 tablet 5  ? ?No current facility-administered medications on file prior to visit.  ? ? ?BP 128/68   Pulse 97   Temp 97.7 ?F (36.5 ?C) (Oral)   Ht _0  (1.6 m)   Wt 147 lb (66.7 kg)   SpO2 99%   BMI 26.04 kg/m?  ? ? ?   ?Objective:  ? Physical Exam ?Vitals and nursing note reviewed.  ?Constitutional:   ?   Appearance: Normal appearance.  ?Abdominal:  ?   General: Abdomen is flat. Bowel sounds are normal.  ?   Palpations: Abdomen is soft.  ?   Tenderness: There is no right CVA tenderness or left CVA tenderness.  ?Skin: ?   General: Skin is warm and dry.  ?Neurological:  ?   General: No focal deficit present.  ?   Mental Status: She is alert and oriented to person, place, and time.  ?Psychiatric:     ?   Mood and Affect: Mood normal.     ?   Behavior: Behavior normal.     ?   Thought Content: Thought content normal.     ?   Judgment: Judgment normal.  ? ? ?   ?Assessment & Plan:  ?1. Frequent urination ?- POC Urinalysis Dipstick- negative  ?- Possible OAB or bladder spasms.  ?- Advised to first cut back on caffeine to see if this resolves  ?- Follow up as needed ? ?[name], NP ? ? ? ?

## 2022-04-06 ENCOUNTER — Telehealth: Payer: Self-pay | Admitting: Adult Health

## 2022-04-06 NOTE — Telephone Encounter (Signed)
Spoke with patient to schedule  AWV ? ?She stated she is having surgery 5/18 and would like a call back week of 5/29 ?

## 2022-04-09 DIAGNOSIS — M2011 Hallux valgus (acquired), right foot: Secondary | ICD-10-CM | POA: Diagnosis not present

## 2022-04-09 DIAGNOSIS — G8918 Other acute postprocedural pain: Secondary | ICD-10-CM | POA: Diagnosis not present

## 2022-04-14 ENCOUNTER — Other Ambulatory Visit: Payer: Self-pay | Admitting: Adult Health

## 2022-04-14 DIAGNOSIS — Z1231 Encounter for screening mammogram for malignant neoplasm of breast: Secondary | ICD-10-CM

## 2022-04-16 ENCOUNTER — Telehealth: Payer: Self-pay | Admitting: Adult Health

## 2022-04-16 NOTE — Telephone Encounter (Addendum)
Pt is  calling  and received a letter from [facility] and per pt was told to throw away fluticasone (FLONASE) 50 MCG/ACT nasal spray due to recall and pt would like something else sent to  [facility] [address], [address] [place] Phone:  [phone]  Fax:  [phone]

## 2022-04-17 NOTE — Telephone Encounter (Signed)
Called pt no answer °

## 2022-04-21 NOTE — Telephone Encounter (Signed)
Pt stated that the pharmacy gave her a different nasal spray. Pt stated that she will call back with the name of the medication for future refill purposes.

## 2022-04-24 NOTE — Progress Notes (Signed)
[name] [name] [address] [place] Phone: [phone] Subjective:   [name], am serving as a scribe for Dr. Hulan Saas.  I'm seeing this patient by the request  of:  [name], NP  CC: Hand pain and back pain  [id number]  01/27/2022 Injected both wrists   Update 04/28/2022 Cathy [name] is a 76 y.o. female coming in with complaint of B hand and lumbar radiculopathy. Patient states wants injections in both wrist and both knees. No new complaints.  Regarding her back patient did have an epidural in March for her lumbar spine. Patient did have injection for carpal tunnel including months ago.    Past Medical History:  Diagnosis Date   Chronic constipation    DJD (degenerative joint disease)    knees   Female cystocele    GERD (gastroesophageal reflux disease)    History of esophagitis    History of left inguinal hernia    History of MRSA infection    recurrent carbuncle   History of recurrent UTIs    Hyperlipidemia    Hypertension    Pre-diabetes    diet controlled   Urgency of urination    Past Surgical History:  Procedure Laterality Date   APPENDECTOMY  1980's   BREAST EXCISIONAL BIOPSY Left 1989   COLONOSCOPY  last one 06-18-2015   INGUINAL HERNIA REPAIR Left 05-10-2001   LAPAROSCOPIC LYSIS OF ADHESIONS  01/17/2019   OOPHORECTOMY Right 01/17/2019   PERINEOPLASTY  01/17/2019   SALPINGECTOMY Left 01/17/2019   TOTAL HIP ARTHROPLASTY Right 04/09/2020   Procedure: RIGH TOTAL HIP ARTHROPLASTY ANTERIOR APPROACH;  Surgeon: Melrose Nakayama, MD;  Location: [facility];  Service: Orthopedics;  Laterality: Right;   TOTAL HIP ARTHROPLASTY Left 01/28/2021   Procedure: LEFT TOTAL HIP ARTHROPLASTY ANTERIOR APPROACH;  Surgeon: Melrose Nakayama, MD;  Location: [facility];  Service: Orthopedics;  Laterality: Left;   VAGINAL HYSTERECTOMY  1980's   VAGINAL PROLAPSE REPAIR N/A 03/02/2016   Procedure: COLOPLAST ANTERIOR  VAULT  REPAIR WITH AXIS DERMIS. SACROSPINUS FIXATION, AUGMENTATION WITH AXIS DERMIS;  Surgeon: Carolan Clines, MD;  Location: [facility];  Service: Urology;  Laterality: N/A;   Social History   Socioeconomic History   Marital status: Single    Spouse name: Not on file   Number of children: Not on file   Years of education: Not on file   Highest education level: Not on file  Occupational History   Not on file  Tobacco Use   Smoking status: Former    Years: 1.00    Types: Cigarettes    Quit date: 02/28/1996    Years since quitting: 26.1   Smokeless tobacco: Never  Vaping Use   Vaping Use: Never used  Substance and Sexual Activity   Alcohol use: Yes    Alcohol/week: 4.0 standard drinks    Types: 4 Standard drinks or equivalent per week    Comment: on weekends   Drug use: No   Sexual activity: Not on file  Other Topics Concern   Not on file  Social History Narrative   Single   Former Smoker  -  quit 10 to 11 years ago (light smoker)   Alcohol use-yes     2 children    Occupation: [employer]    Social Determinants of Health   Financial Resource Strain: Not on file  Food Insecurity: Not on file  Transportation Needs: Not on file  Physical Activity:  Not on file  Stress: Not on file  Social Connections: Not on file   Allergies  Allergen Reactions   Penicillins Hives    Has patient had a PCN reaction causing immediate rash, facial/tongue/throat swelling, SOB or lightheadedness with hypotension: Yes Has patient had a PCN reaction causing severe rash involving mucus membranes or skin necrosis: No Has patient had a PCN reaction that required hospitalization: No Has patient had a PCN reaction occurring within the last 10 years: No If all of the above answers are "NO", then may proceed with Cephalosporin use.    Family History  Problem Relation Age of Onset   Aneurysm Mother 6       deceased secondary to brain aneurysm   Liver cancer Father 46        deceased   Diabetes Other        grandmother   Colon cancer Neg Hx    Stomach cancer Neg Hx    Rectal cancer Neg Hx    Esophageal cancer Neg Hx    Breast cancer Neg Hx      Current Outpatient Medications (Cardiovascular):    lisinopril (ZESTRIL) 10 MG tablet, TAKE 1 TABLET(10 MG) BY MOUTH DAILY   rosuvastatin (CRESTOR) 40 MG tablet, TAKE 1 TABLET(40 MG) BY MOUTH DAILY  Current Outpatient Medications (Respiratory):    fexofenadine (ALLEGRA) 180 MG tablet, Take 180 mg by mouth daily.   fluticasone (FLONASE) 50 MCG/ACT nasal spray, SHAKE LIQUID AND USE 2 SPRAYS IN EACH NOSTRIL DAILY  Current Outpatient Medications (Analgesics):    aspirin 81 MG tablet, Take 1 tablet (81 mg total) by mouth 2 (two) times daily after a meal. (Patient taking differently: Take 81 mg by mouth daily.)  Current Outpatient Medications (Hematological):    Cyanocobalamin (VITAMIN B-12) 2500 MCG SUBL, Place 2,500 mcg under the tongue daily.  Current Outpatient Medications (Other):    Ascorbic Acid (VITAMIN C) 1000 MG tablet, Take 1,000 mg by mouth daily.   b complex vitamins capsule, Take 1 capsule by mouth daily.   Biotin 1000 MCG tablet, Take 1,000 mcg by mouth daily.   Blood Glucose Monitoring Suppl (ONETOUCH VERIO FLEX SYSTEM) w/Device KIT, TEST TWICE DAILY   Calcium Carbonate-Vitamin D 600-400 MG-UNIT tablet, Take 1 tablet by mouth daily.   Chromium 1000 MCG TABS, Take 1,000 mcg by mouth daily.   Cinnamon 500 MG capsule, Take 1,000 mg by mouth daily.   CRANBERRY PO, Take 2 tablets by mouth daily. 4200 mg   cyclobenzaprine (FLEXERIL) 10 MG tablet, Take 1 tablet (10 mg total) by mouth 3 (three) times daily as needed for muscle spasms.   ELDERBERRY PO, Take 1 capsule by mouth daily.   estradiol (ESTRACE) 0.1 MG/GM vaginal cream, Place 1 Applicatorful vaginally every other day. In the evening   gabapentin (NEURONTIN) 100 MG capsule, Take 1 capsule (100 mg total) by mouth at bedtime.   Lancets (ONETOUCH  DELICA PLUS [id number]) MISC, USE ONCE DAILY   Misc Natural Products (TART CHERRY ADVANCED) CAPS, Take 2 capsules by mouth daily.   Multiple Vitamins-Minerals (MULTIVITAMIN WITH MINERALS) tablet, Take 1 tablet by mouth daily. Centrum silver   ONETOUCH VERIO test strip, USE TWICE DAILY   OVER THE COUNTER MEDICATION, Take 1 tablet by mouth daily as needed (constipation). Vital Lax   polyethylene glycol (MIRALAX / GLYCOLAX) packet, Take 17 g by mouth daily as needed for mild constipation.   tiZANidine (ZANAFLEX) 2 MG tablet, Take 1 tablet (2 mg total) by mouth at bedtime.  Turmeric 500 MG CAPS, Take 1,000 mg by mouth daily.    Reviewed prior external information including notes and imaging from  primary care provider As well as notes that were available from care everywhere and other healthcare systems.  Past medical history, social, surgical and family history all reviewed in electronic medical record.  No pertanent information unless stated regarding to the chief complaint.   Review of Systems:  No headache, visual changes, nausea, vomiting, diarrhea, constipation, dizziness, abdominal pain, skin rash, fevers, chills, night sweats, weight loss, swollen lymph nodes, body aches, joint swelling, chest pain, shortness of breath, mood changes. POSITIVE muscle aches  Objective  Pulse (!) 118, height 5' 3"  (1.6 m), SpO2 98 %.   General: No apparent distress alert and oriented x3 mood and affect normal, dressed appropriately.  HEENT: Pupils equal, extraocular movements intact  Respiratory: Patient's speak in full sentences and does not appear short of breath  Cardiovascular: No lower extremity edema, non tender, no erythema  Severe antalgic gait noted.  Patient does have arthritic changes of the knees bilaterally.  Instability noted with valgus and varus force.  Patient does have trace effusion noted bilaterally. Hand exam shows some very mild thenar eminence wasting noted.  Positive Tinel's  bilaterally.  Seems to be right greater than left.  Patient still has good grip strength though noted.  Procedure: Real-time Ultrasound Guided Injection of right carpal tunnel Device: GE Logiq Q7  Ultrasound guided injection is preferred based studies that show increased duration, increased effect, greater accuracy, decreased procedural pain, increased response rate with ultrasound guided versus blind injection.  Verbal informed consent obtained.  Time-out conducted.  Noted no overlying erythema, induration, or other signs of local infection.  Skin prepped in a sterile fashion.  Local anesthesia: Topical Ethyl chloride.  With sterile technique and under real time ultrasound guidance:  median nerve visualized.  23g 5/8 inch needle inserted distal to proximal approach into nerve sheath. Pictures taken nfor needle placement. Patient did have injection of 0.5 cc of 0.5% Marcaine, and 0.5 cc of Kenalog 40 mg/dL. Completed without difficulty  Pain immediately improved suggesting accurate placement of the medication.  Advised to call if fevers/chills, erythema, induration, drainage, or persistent bleeding.  Impression: Technically successful ultrasound guided injection.  Procedure: Real-time Ultrasound Guided Injection of left  carpal tunnel Device: GE Logiq Q7 Ultrasound guided injection is preferred based studies that show increased duration, increased effect, greater accuracy, decreased procedural pain, increased response rate with ultrasound guided versus blind injection.  Verbal informed consent obtained.  Time-out conducted.  Noted no overlying erythema, induration, or other signs of local infection.  Skin prepped in a sterile fashion.  Local anesthesia: Topical Ethyl chloride.  With sterile technique and under real time ultrasound guidance:  median nerve visualized.  23g 5/8 inch needle inserted distal to proximal approach into nerve sheath. Pictures taken nfor needle placement. Patient did  have injection of 0.5 cc of 0.5% Marcaine, and 0.5 cc of Kenalog 40 mg/dL. Completed without difficulty  Pain immediately improved  suggesting accurate placement of the medication.  Advised to call if fevers/chills, erythema, induration, drainage, or persistent bleeding.  Impression: Technically successful ultrasound guided injection.  After informed written and verbal consent, patient was seated on exam table. Right knee was prepped with alcohol swab and utilizing anterolateral approach, patient's right knee space was injected with 4:1  marcaine 0.5%: Kenalog 75m/dL. Patient tolerated the procedure well without immediate complications.  After informed written and verbal consent, patient was  seated on exam table. Left knee was prepped with alcohol swab and utilizing anterolateral approach, patient's left knee space was injected with 4:1  marcaine 0.5%: Kenalog 92m/dL. Patient tolerated the procedure well without immediate complications.    Impression and Recommendations:     The above documentation has been reviewed and is accurate and complete ZLyndal Pulley DO

## 2022-04-27 ENCOUNTER — Telehealth: Payer: Self-pay | Admitting: Adult Health

## 2022-04-27 NOTE — Telephone Encounter (Signed)
Left message for patient to call back and schedule Medicare Annual Wellness Visit (AWV) either virtually or in office. Left  my jabber number [phone]   Last AWV 06/10/20 ; please schedule at anytime with [facility] Nurse Health Advisor 1 or 2     

## 2022-04-28 ENCOUNTER — Ambulatory Visit (INDEPENDENT_AMBULATORY_CARE_PROVIDER_SITE_OTHER): Payer: Medicare Other | Admitting: Family Medicine

## 2022-04-28 ENCOUNTER — Ambulatory Visit: Payer: Self-pay

## 2022-04-28 ENCOUNTER — Encounter: Payer: Self-pay | Admitting: Family Medicine

## 2022-04-28 VITALS — BP 138/80 | HR 118 | Ht 63.0 in

## 2022-04-28 DIAGNOSIS — M25532 Pain in left wrist: Secondary | ICD-10-CM

## 2022-04-28 DIAGNOSIS — M25531 Pain in right wrist: Secondary | ICD-10-CM

## 2022-04-28 DIAGNOSIS — M170 Bilateral primary osteoarthritis of knee: Secondary | ICD-10-CM | POA: Diagnosis not present

## 2022-04-28 DIAGNOSIS — G5603 Carpal tunnel syndrome, bilateral upper limbs: Secondary | ICD-10-CM | POA: Diagnosis not present

## 2022-04-28 NOTE — Assessment & Plan Note (Signed)
Chronic problem with exacerbation.  Worsening symptoms.  Patient is still holding off any type of replacement.  Patient will come back again in 2 to 3 months and would be a candidate again for viscosupplementation.  Patient declined referral to orthopedic surgery.

## 2022-04-28 NOTE — Patient Instructions (Signed)
Good to see you! Can get Gel injection after August 7th Injections in wrist and knees today See you again in 2-3 months

## 2022-04-28 NOTE — Assessment & Plan Note (Signed)
Patient does state that it makes complete resolution of symptoms for a while.  He started having worsening symptoms again over the last week or 2.  Patient wants to avoid any surgical patient would like to try the injections again.  Discussed that doing more every 12 months we could consider surgical intervention.  Follow-up again in 2 to 3 months

## 2022-05-06 ENCOUNTER — Encounter: Payer: Self-pay | Admitting: Adult Health

## 2022-05-06 ENCOUNTER — Ambulatory Visit (INDEPENDENT_AMBULATORY_CARE_PROVIDER_SITE_OTHER): Payer: Medicare Other | Admitting: Adult Health

## 2022-05-06 VITALS — BP 120/60 | HR 81 | Temp 97.1°F | Ht 63.0 in | Wt 139.0 lb

## 2022-05-06 DIAGNOSIS — N3281 Overactive bladder: Secondary | ICD-10-CM

## 2022-05-06 DIAGNOSIS — R350 Frequency of micturition: Secondary | ICD-10-CM

## 2022-05-06 LAB — POCT URINALYSIS DIPSTICK
Bilirubin, UA: NEGATIVE
Blood, UA: NEGATIVE
Glucose, UA: NEGATIVE
Ketones, UA: NEGATIVE
Leukocytes, UA: NEGATIVE
Nitrite, UA: NEGATIVE
Protein, UA: NEGATIVE
Spec Grav, UA: 1.015 (ref 1.010–1.025)
Urobilinogen, UA: 0.2 E.U./dL
pH, UA: 6 (ref 5.0–8.0)

## 2022-05-06 NOTE — Progress Notes (Signed)
Subjective:    Patient ID: Cathy [name], female    DOB: [birth year], 76 y.o.   MRN: [id number]  HPI 76 year old female who  has a past medical history of Chronic constipation, DJD (degenerative joint disease), Female cystocele, GERD (gastroesophageal reflux disease), History of esophagitis, History of left inguinal hernia, History of MRSA infection, History of recurrent UTIs, Hyperlipidemia, Hypertension, Pre-diabetes, and Urgency of urination.  She presents to the office today for concern of UTI. She reports frequent urination as her main symptom is frequent urination at night. She is getting up 4-5 times a night to urinate. Denies dysuria, hematuria, low back pain, pelvic pain or other UTI like symptoms.   She has cut back on caffeine and water without improvement    Review of Systems See HPI   Past Medical History:  Diagnosis Date   Chronic constipation    DJD (degenerative joint disease)    knees   Female cystocele    GERD (gastroesophageal reflux disease)    History of esophagitis    History of left inguinal hernia    History of MRSA infection    recurrent carbuncle   History of recurrent UTIs    Hyperlipidemia    Hypertension    Pre-diabetes    diet controlled   Urgency of urination     Social History   Socioeconomic History   Marital status: Single    Spouse name: Not on file   Number of children: Not on file   Years of education: Not on file   Highest education level: Not on file  Occupational History   Not on file  Tobacco Use   Smoking status: Former    Years: 1.00    Types: Cigarettes    Quit date: 02/28/1996    Years since quitting: 26.2   Smokeless tobacco: Never  Vaping Use   Vaping Use: Never used  Substance and Sexual Activity   Alcohol use: Yes    Alcohol/week: 4.0 standard drinks of alcohol    Types: 4 Standard drinks or equivalent per week    Comment: on weekends   Drug use: No   Sexual activity: Not on file  Other Topics Concern    Not on file  Social History Narrative   Single   Former Smoker  -  quit 10 to 11 years ago (light smoker)   Alcohol use-yes     2 children    Occupation: [employer]    Social Determinants of Health   Financial Resource Strain: [place]  (06/10/2020)   Overall Financial Resource Strain (CARDIA)    Difficulty of Paying Living Expenses: Not hard at all  Food Insecurity: No Food Insecurity (06/10/2020)   Hunger Vital Sign    Worried About Running Out of Food in the Last Year: Never true    [place] in the Last Year: Never true  Transportation Needs: No Transportation Needs (06/10/2020)   PRAPARE - Hydrologist (Medical): No    Lack of Transportation (Non-Medical): No  Physical Activity: Inactive (06/10/2020)   Exercise Vital Sign    Days of Exercise per Week: 0 days    Minutes of Exercise per Session: 0 min  Stress: No Stress Concern Present (06/10/2020)   [place]    Feeling of Stress : Not at all  Social Connections: Moderately Isolated (06/10/2020)   Social Connection and Isolation Panel [NHANES]  Frequency of Communication with Friends and Family: More than three times a week    Frequency of Social Gatherings with Friends and Family: More than three times a week    Attends Religious Services: More than 4 times per year    Active Member of Clubs or Organizations: No    Attends Archivist Meetings: Never    Marital Status: Never married  Intimate Partner Violence: Not At Risk (06/10/2020)   Humiliation, Afraid, Rape, and Kick questionnaire    Fear of Current or Ex-Partner: No    Emotionally Abused: No    Physically Abused: No    Sexually Abused: No    Past Surgical History:  Procedure Laterality Date   APPENDECTOMY  1980's   BREAST EXCISIONAL BIOPSY Left 1989   COLONOSCOPY  last one 06-18-2015   INGUINAL HERNIA REPAIR Left 05-10-2001    LAPAROSCOPIC LYSIS OF ADHESIONS  01/17/2019   OOPHORECTOMY Right 01/17/2019   PERINEOPLASTY  01/17/2019   SALPINGECTOMY Left 01/17/2019   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 SACROSPINUS FIXATION, AUGMENTATION WITH AXIS DERMIS;  Surgeon: Carolan Clines, MD;  Location: [facility];  Service: Urology;  Laterality: N/A;    Family History  Problem Relation Age of Onset   Aneurysm Mother 91       deceased secondary to brain aneurysm   Liver cancer Father 89       deceased   Diabetes Other        grandmother   Colon cancer Neg Hx    Stomach cancer Neg Hx    Rectal cancer Neg Hx    Esophageal cancer Neg Hx    Breast cancer Neg Hx     Allergies  Allergen Reactions   Penicillins Hives    Has patient had a PCN reaction causing immediate rash, facial/tongue/throat swelling, SOB or lightheadedness with hypotension: Yes Has patient had a PCN reaction causing severe rash involving mucus membranes or skin necrosis: No Has patient had a PCN reaction that required hospitalization: No Has patient had a PCN reaction occurring within the last 10 years: No If all of the above answers are "NO", then may proceed with Cephalosporin use.     Current Outpatient Medications on File Prior to Visit  Medication Sig Dispense Refill   Ascorbic Acid (VITAMIN C) 1000 MG tablet Take 1,000 mg by mouth daily.     aspirin 81 MG tablet Take 1 tablet (81 mg total) by mouth 2 (two) times daily after a meal. (Patient taking  differently: Take 81 mg by mouth daily.) 60 tablet 0   b complex vitamins capsule Take 1 capsule by mouth daily.     Biotin 1000 MCG tablet Take 1,000 mcg by mouth daily.     Blood Glucose Monitoring Suppl (ONETOUCH VERIO FLEX SYSTEM) w/Device KIT TEST TWICE DAILY 1 kit 0   Calcium Carbonate-Vitamin D 600-400 MG-UNIT tablet Take 1 tablet by mouth daily.     Chromium 1000 MCG TABS Take 1,000 mcg by mouth daily.     Cinnamon 500 MG  capsule Take 1,000 mg by mouth daily.     CRANBERRY PO Take 2 tablets by mouth daily. 4200 mg     Cyanocobalamin (VITAMIN B-12) 2500 MCG SUBL Place 2,500 mcg under the tongue daily.     cyclobenzaprine (FLEXERIL) 10 MG tablet Take 1 tablet (10 mg total) by mouth 3 (three) times daily as needed for muscle spasms. 30 tablet 0   ELDERBERRY PO Take 1 capsule by mouth daily.     estradiol (ESTRACE) 0.1 MG/GM vaginal cream Place 1 Applicatorful vaginally every other day. In the evening  3   fexofenadine (ALLEGRA) 180 MG tablet Take 180 mg by mouth daily.     fluticasone (FLONASE) 50 MCG/ACT nasal spray SHAKE LIQUID AND USE 2 SPRAYS IN EACH NOSTRIL DAILY 48 g 2   gabapentin (NEURONTIN) 100 MG capsule Take 1 capsule (100 mg total) by mouth at bedtime. 90 capsule 0   Lancets (ONETOUCH DELICA PLUS [id number]) MISC USE ONCE DAILY 100 each 3   lisinopril (ZESTRIL) 10 MG tablet TAKE 1 TABLET(10 MG) BY MOUTH DAILY 90 tablet 3   Misc Natural Products (TART CHERRY ADVANCED) CAPS Take 2 capsules by mouth daily.     Multiple Vitamins-Minerals (MULTIVITAMIN WITH MINERALS) tablet Take 1 tablet by mouth daily. Centrum silver     ONETOUCH VERIO test strip USE TWICE DAILY 200 strip 3   OVER THE COUNTER MEDICATION Take 1 tablet by mouth daily as needed (constipation). Vital Lax     polyethylene glycol (MIRALAX / GLYCOLAX) packet Take 17 g by mouth daily as needed for mild constipation.     rosuvastatin (CRESTOR) 40 MG tablet TAKE 1 TABLET(40 MG) BY MOUTH DAILY 90 tablet 1   tiZANidine  (ZANAFLEX) 2 MG tablet Take 1 tablet (2 mg total) by mouth at bedtime. 30 tablet 0   Turmeric 500 MG CAPS Take 1,000 mg by mouth daily.      [DISCONTINUED] valsartan (DIOVAN) 160 MG tablet Take 1/2 tablet by mouth once daily 30 tablet 5   No current facility-administered medications on file prior to visit.    BP 120/60   Pulse 81   Temp (!) 97.1 F (36.2 C) (Oral)   Ht 5' 3"  (1.6 m)   Wt 139 lb (63 kg)   SpO2 100%   BMI 24.62 kg/m       Objective:   Physical Exam Cardiovascular:     Rate and Rhythm: Normal rate and regular rhythm.     Pulses: Normal pulses.     Heart sounds: Normal heart sounds.  Pulmonary:     Effort: Pulmonary effort is normal.     Breath sounds: Normal breath sounds.  Abdominal:     General: Abdomen is flat. Bowel sounds are normal.     Palpations: Abdomen is soft.     Tenderness: There is no right CVA tenderness or left CVA tenderness.  Psychiatric:        Mood and Affect: Mood normal.        Behavior: Behavior normal.        Thought Content: Thought content normal.        Judgment: Judgment normal.        Assessment & Plan:  1. Frequent urination  - POC Urinalysis Dipstick- negative   2. OAB (overactive bladder) Samples of Myrbetriq 25 mg were given to the patient, quantity 14, Lot Number [id number].  - she will follow up with me and let me know how medication works for her   [employer]  Layliana Devins, NP

## 2022-05-25 ENCOUNTER — Ambulatory Visit: Payer: Medicare Other

## 2022-05-27 ENCOUNTER — Ambulatory Visit
Admission: RE | Admit: 2022-05-27 | Discharge: 2022-05-27 | Disposition: A | Discharge status: Home / Self Care | Payer: Medicare Other | Source: Ambulatory Visit | Attending: Adult Health | Admitting: Adult Health

## 2022-05-27 DIAGNOSIS — Z1231 Encounter for screening mammogram for malignant neoplasm of breast: Secondary | ICD-10-CM

## 2022-05-29 ENCOUNTER — Telehealth: Payer: Self-pay | Admitting: *Deleted

## 2022-05-29 NOTE — Telephone Encounter (Signed)
Pt called requesting a refill for tizanidine '2mg'$ . She would also like an order for another epidural sent to [facility].

## 2022-05-30 NOTE — Telephone Encounter (Signed)
Yes that is fine

## 2022-06-01 ENCOUNTER — Other Ambulatory Visit: Payer: Self-pay | Admitting: Family Medicine

## 2022-06-01 DIAGNOSIS — G8929 Other chronic pain: Secondary | ICD-10-CM

## 2022-06-01 MED ORDER — TIZANIDINE HCL 2 MG PO TABS: 2.0000 mg | ORAL_TABLET | Freq: Every day | ORAL | 0 refills | Status: DC

## 2022-06-01 NOTE — Telephone Encounter (Signed)
Prescription has been refilled and epidural ordered. Will contact patient later today to inform

## 2022-06-02 DIAGNOSIS — N390 Urinary tract infection, site not specified: Secondary | ICD-10-CM | POA: Diagnosis not present

## 2022-06-10 ENCOUNTER — Telehealth: Payer: Self-pay | Admitting: Adult Health

## 2022-06-10 ENCOUNTER — Ambulatory Visit
Admission: RE | Admit: 2022-06-10 | Discharge: 2022-06-10 | Disposition: A | Discharge status: Home / Self Care | Payer: Medicare Other | Source: Ambulatory Visit | Attending: Family Medicine | Admitting: Family Medicine

## 2022-06-10 DIAGNOSIS — M47817 Spondylosis without myelopathy or radiculopathy, lumbosacral region: Secondary | ICD-10-CM | POA: Diagnosis not present

## 2022-06-10 DIAGNOSIS — G8929 Other chronic pain: Secondary | ICD-10-CM

## 2022-06-10 MED ORDER — METHYLPREDNISOLONE ACETATE 40 MG/ML INJ SUSP (RADIOLOG
80.0000 mg | Freq: Once | INTRAMUSCULAR | Status: AC
  Administered 2022-06-10: 80 mg via EPIDURAL

## 2022-06-10 MED ORDER — IOPAMIDOL (ISOVUE-M 200) INJECTION 41%
1.0000 mL | Freq: Once | INTRAMUSCULAR | Status: AC
  Administered 2022-06-10: 1 mL via EPIDURAL

## 2022-06-10 NOTE — Discharge Instructions (Signed)

## 2022-06-10 NOTE — Telephone Encounter (Signed)
Left message for patient to call back and schedule Medicare Annual Wellness Visit (AWV) either virtually or in office. Left  my Herbie Drape number [phone]   Last AWV 06/10/20 ; please schedule at anytime with [facility] Nurse Health Advisor 1 or 2

## 2022-06-16 ENCOUNTER — Telehealth: Payer: Self-pay | Admitting: Adult Health

## 2022-06-16 DIAGNOSIS — T148XXA Other injury of unspecified body region, initial encounter: Secondary | ICD-10-CM

## 2022-06-16 MED ORDER — CYCLOBENZAPRINE HCL 10 MG PO TABS: 10.0000 mg | ORAL_TABLET | Freq: Three times a day (TID) | ORAL | 1 refills | Status: DC | PRN

## 2022-06-16 NOTE — Telephone Encounter (Signed)
Pt call and stated she need a refill on cyclobenzaprine (FLEXERIL) 10 MG tablet sent to  [facility] [phone] - [place], [place] AT [address] Phone:  [phone]  Fax:  [phone]

## 2022-06-16 NOTE — Telephone Encounter (Signed)
Okay for refill?  

## 2022-06-17 NOTE — Telephone Encounter (Signed)
Refilled by provider

## 2022-06-23 ENCOUNTER — Telehealth (INDEPENDENT_AMBULATORY_CARE_PROVIDER_SITE_OTHER): Payer: Medicare Other | Admitting: Adult Health

## 2022-06-23 ENCOUNTER — Encounter: Payer: Self-pay | Admitting: Adult Health

## 2022-06-23 VITALS — Ht 63.0 in | Wt 139.0 lb

## 2022-06-23 DIAGNOSIS — T50905A Adverse effect of unspecified drugs, medicaments and biological substances, initial encounter: Secondary | ICD-10-CM

## 2022-06-23 NOTE — Progress Notes (Signed)
Virtual Visit via Telephone Note  I connected with [name] on 06/23/22 at  5:00 PM EDT by telephone and verified that I am speaking with the correct person using two identifiers.   I discussed the limitations, risks, security and privacy concerns of performing an evaluation and management service by telephone and the availability of in person appointments. I also discussed with the patient that there may be a patient responsible charge related to this service. The patient expressed understanding and agreed to proceed.  Location patient: home Location provider: work or home office Participants present for the call: patient, provider Patient did not have a visit in the prior 7 days to address this/these issue(s).   History of Present Illness: She is being evaluated today for medication reaction.  She reports that Flexeril was causing nightmares and tendinitis.  She last took Flexeril 3 days ago and her nightmares resolved and tendinitis seems to be improving.  She does remember the symptoms happening years ago while on Flexeril.   Observations/Objective: Patient sounds cheerful and well on the phone. I do not appreciate any SOB. Speech and thought processing are grossly intact. Patient reported vitals:  Assessment and Plan: 1. Adverse effect of drug, initial encounter - Will d/c flexeril    Follow Up Instructions:  DIAGMED@   [id number] 5-10 [id number] 11-20 [id number] 21-30 I did not refer this patient for an OV in the next 24 hours for this/these issue(s).  I discussed the assessment and treatment plan with the patient. The patient was provided an opportunity to ask questions and all were answered. The patient agreed with the plan and demonstrated an understanding of the instructions.   The patient was advised to call back or seek an in-person evaluation if the symptoms worsen or if the condition fails to improve as anticipated.  I provided 11 minutes of non-face-to-face time during  this encounter.   [name], NP

## 2022-06-25 NOTE — Progress Notes (Signed)
Cathy [name] Phone: [phone] Subjective:   Cathy [name], am serving as a scribe for Dr. Hulan Saas.  I'm seeing this patient by the request  of:  Cathy Peng, NP  CC: Knee pain, leg pain, wrist pain  [id number]  04/28/2022 Patient does state that it makes complete resolution of symptoms for a while.  He started having worsening symptoms again over the last week or 2.  Patient wants to avoid any surgical patient would like to try the injections again.  Discussed that doing more every 12 months we could consider surgical intervention.  Follow-up again in 2 to 3 months  Chronic problem with exacerbation.  Worsening symptoms.  Patient is still holding off any type of replacement.  Patient will come back again in 2 to 3 months and would be a candidate again for viscosupplementation.  Patient declined referral to orthopedic surgery.  Update 06/30/2022 Cathy [name] is a 76 y.o. female coming in with complaint of B knee and B wrist pain. Patient states that her pain has increased in knees and wrists. Would like injections.   Also having pain in L hip over GT. History of THR. Occasional tingling and burning.  Patient has also had lateral cutaneous nerve entrapment we do feel it could be secondary to the patient's previous surgery.  Patient did have a good response to injection year ago.       Past Medical History:  Diagnosis Date   Chronic constipation    DJD (degenerative joint disease)    knees   Female cystocele    GERD (gastroesophageal reflux disease)    History of esophagitis    History of left inguinal hernia    History of MRSA infection    recurrent carbuncle   History of recurrent UTIs    Hyperlipidemia    Hypertension    Pre-diabetes    diet controlled   Urgency of urination    Past Surgical History:  Procedure Laterality Date   APPENDECTOMY  1980's   BREAST EXCISIONAL BIOPSY Left 1989    COLONOSCOPY  last one 06-18-2015   INGUINAL HERNIA REPAIR Left 05-10-2001   LAPAROSCOPIC LYSIS OF ADHESIONS  01/17/2019   OOPHORECTOMY Right 01/17/2019   PERINEOPLASTY  01/17/2019   SALPINGECTOMY Left 01/17/2019   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 SACROSPINUS FIXATION, AUGMENTATION WITH AXIS DERMIS;  Surgeon: Carolan Clines, MD;  Location: [facility];  Service: Urology;  Laterality: N/A;   Social History   Socioeconomic History   Marital status: Single    Spouse name: Not on file   Number of children: Not on file   Years of education: Not on file   Highest education level: Not on file  Occupational History   Not on file  Tobacco Use   Smoking status: Former    Years: 1.00    Types: Cigarettes    Quit date: 02/28/1996    Years since quitting: 26.3   Smokeless tobacco: Never  Vaping Use  Vaping Use: Never used  Substance and Sexual Activity   Alcohol use: Yes    Alcohol/week: 4.0 standard drinks of alcohol    Types: 4 Standard drinks or equivalent per week    Comment: on weekends   Drug use: [name]   Sexual activity: Not on file  Other Topics Concern   Not on file  Social History Narrative   Single   Former Smoker  -  quit 10 to 11 years ago (light smoker)   Alcohol use-yes     2 children    Occupation: [employer]    Social Determinants of Health   Financial Resource Strain: Low Risk  (06/10/2020)   Overall Financial Resource Strain (CARDIA)    Difficulty of  Paying Living Expenses: Not hard at all  Food Insecurity: [name] Food Insecurity (06/10/2020)   Hunger Vital Sign    Worried About Running Out of Food in the Last Year: Never true    Ran Out of Food in the Last Year: Never true  Transportation Needs: [name] Transportation Needs (06/10/2020)   PRAPARE - Transportation    Lack of Transportation (Medical): [name]    Lack of Transportation (Non-Medical): [name]  Physical Activity: Inactive (06/10/2020)   Exercise Vital Sign    Days of Exercise per Week: 0 days    Minutes of Exercise per Session: 0 min  Stress: [name] Stress Concern Present (06/10/2020)   [employer] - Occupational Stress Questionnaire    Feeling of Stress : Not at all  Social Connections: Moderately Isolated (06/10/2020)   Social Connection and Isolation Panel [NHANES]    Frequency of Communication with Friends and Family: More than three times a week    Frequency of Social Gatherings with Friends and Family: More than three times a week    Attends Religious Services: More than 4 times per year    Active Member of Clubs or Organizations: [name]    Attends Club or Organization Meetings: Never    Marital Status: Never married   Allergies  Allergen Reactions   Flexeril [Cyclobenzaprine]     Hallucinations    Penicillins Hives    Has patient had a PCN reaction causing immediate rash, facial/tongue/throat swelling, SOB or lightheadedness with hypotension: Yes Has patient had a PCN reaction causing severe rash involving mucus membranes or skin necrosis: [name] Has patient had a PCN reaction that required hospitalization: [name] Has patient had a PCN reaction occurring within the last 10 years: [name] If all of the above answers are "[name]", then may proceed with Cephalosporin use.    Family History  Problem Relation Age of Onset   Aneurysm Mother 35       deceased secondary to brain aneurysm   Liver cancer Father 62       deceased   Diabetes Other        grandmother   Colon  cancer Neg Hx    Stomach cancer Neg Hx    Rectal cancer Neg Hx    Esophageal cancer Neg Hx    Breast cancer Neg Hx      Current Outpatient Medications (Cardiovascular):    lisinopril (ZESTRIL) 10 MG tablet, TAKE 1 TABLET(10 MG) BY MOUTH DAILY   rosuvastatin (CRESTOR) 40 MG tablet, TAKE 1 TABLET(40 MG) BY MOUTH DAILY  Current Outpatient Medications (Respiratory):    fexofenadine (ALLEGRA) 180 MG tablet, Take 180 mg by mouth daily.   fluticasone (FLONASE) 50 MCG/ACT nasal spray, SHAKE LIQUID AND USE 2 SPRAYS   IN EACH NOSTRIL DAILY  Current Outpatient Medications (Analgesics):    aspirin 81 MG tablet, Take 1 tablet (81 mg total) by mouth 2 (two) times daily after a meal. (Patient taking differently: Take 81 mg by mouth daily.)  Current Outpatient Medications (Hematological):    Cyanocobalamin (VITAMIN B-12) 2500 MCG SUBL, Place 2,500 mcg under the tongue daily.  Current Outpatient Medications (Other):    Ascorbic Acid (VITAMIN C) 1000 MG tablet, Take 1,000 mg by mouth daily.   b complex vitamins capsule, Take 1 capsule by mouth daily.   Biotin 1000 MCG tablet, Take 1,000 mcg by mouth daily.   Blood Glucose Monitoring Suppl (ONETOUCH VERIO FLEX SYSTEM) w/Device KIT, TEST TWICE DAILY   Calcium Carbonate-Vitamin D 600-400 MG-UNIT tablet, Take 1 tablet by mouth daily.   Chromium 1000 MCG TABS, Take 1,000 mcg by mouth daily.   Cinnamon 500 MG capsule, Take 1,000 mg by mouth daily.   CRANBERRY PO, Take 2 tablets by mouth daily. 4200 mg   cyclobenzaprine (FLEXERIL) 10 MG tablet, Take 1 tablet (10 mg total) by mouth 3 (three) times daily as needed for muscle spasms.   ELDERBERRY PO, Take 1 capsule by mouth daily.   estradiol (ESTRACE) 0.1 MG/GM vaginal cream, Place 1 Applicatorful vaginally every other day. In the evening   gabapentin (NEURONTIN) 100 MG capsule, Take 1 capsule (100 mg total) by mouth at bedtime.   Lancets (ONETOUCH DELICA PLUS LANCET33G) MISC, USE ONCE DAILY   Misc Natural  Products (TART CHERRY ADVANCED) CAPS, Take 2 capsules by mouth daily.   Multiple Vitamins-Minerals (MULTIVITAMIN WITH MINERALS) tablet, Take 1 tablet by mouth daily. Centrum silver   ONETOUCH VERIO test strip, USE TWICE DAILY   OVER THE COUNTER MEDICATION, Take 1 tablet by mouth daily as needed (constipation). Vital Lax   polyethylene glycol (MIRALAX / GLYCOLAX) packet, Take 17 g by mouth daily as needed for mild constipation.   tiZANidine (ZANAFLEX) 2 MG tablet, Take 1 tablet (2 mg total) by mouth at bedtime.   Turmeric 500 MG CAPS, Take 1,000 mg by mouth daily.    Reviewed prior external information including notes and imaging from  primary care provider As well as notes that were available from care everywhere and other healthcare systems.  Past medical history, social, surgical and family history all reviewed in electronic medical record.  [name] pertanent information unless stated regarding to the chief complaint.   Review of Systems:  [name] headache, visual changes, nausea, vomiting, diarrhea, constipation, dizziness, abdominal pain, skin rash, fevers, chills, night sweats, weight loss, swollen lymph nodes, body aches, joint swelling, chest pain, shortness of breath, mood changes. POSITIVE muscle aches  Objective  Blood pressure 110/82, pulse 83, height 5' 3" (1.6 m), weight 144 lb (65.3 kg), SpO2 98 %.   General: [name] apparent distress alert and oriented x3 mood and affect normal, dressed appropriately.  HEENT: Pupils equal, extraocular movements intact  Respiratory: Patient's speak in full sentences and does not appear short of breath  Cardiovascular: [name] lower extremity edema, non tender, [name] erythema  Severe antalgic gait noted.  Patient does have significant arthritic changes of multiple joints.  Patient does have tenderness over the knees but seems to be more left greater than right.  Patient does have more tenderness noted over the left thigh than truly the hip over the greater  trochanteric area.  Positive Tinel's of the wrist.  Grip strength is symmetric.  After informed written and verbal consent, patient was seated on exam table.   Right knee was prepped with alcohol swab and utilizing anterolateral approach, patient's right knee space was injected with 4:1  marcaine 0.5%: Kenalog 57m/dL. Patient tolerated the procedure well without immediate complications.  After informed written and verbal consent, patient was seated on exam table. Left knee was prepped with alcohol swab and utilizing anterolateral approach, patient's left knee space was injected with 4:1  marcaine 0.5%: Kenalog 469mdL. Patient tolerated the procedure well without immediate complications.  Procedure: Real-time Ultrasound Guided Injection of left cutaneous femoral nerve Device: GE Logiq Q7 Ultrasound guided injection is preferred based studies that show increased duration, increased effect, greater accuracy, decreased procedural pain, increased response rate, and decreased cost with ultrasound guided versus blind injection.  Verbal informed consent obtained.  Time-out conducted.  Noted [name] overlying erythema, induration, or other signs of local infection.  Skin prepped in a sterile fashion.  Local anesthesia: Topical Ethyl chloride.  With sterile technique and under real time ultrasound guidance: With a 21-gauge 2 inch needle injected with 0.5 cc of 0.5% Marcaine with 1 cc of Kenalog 40 mg/mL into the peroneal sheath of the fascia Completed without difficulty  Pain immediately improved  suggesting accurate placement of the medication.  Advised to call if fevers/chills, erythema, induration, drainage, or persistent bleeding.  Impression: Technically successful ultrasound guided injection.    Impression and Recommendations:    The above documentation has been reviewed and is accurate and complete ZaLyndal PulleyDO

## 2022-06-30 ENCOUNTER — Encounter: Payer: Self-pay | Admitting: Family Medicine

## 2022-06-30 ENCOUNTER — Ambulatory Visit: Payer: Self-pay

## 2022-06-30 ENCOUNTER — Ambulatory Visit (INDEPENDENT_AMBULATORY_CARE_PROVIDER_SITE_OTHER): Payer: Medicare Other | Admitting: Family Medicine

## 2022-06-30 VITALS — BP 110/82 | HR 83 | Ht 63.0 in | Wt 144.0 lb

## 2022-06-30 DIAGNOSIS — Z96642 Presence of left artificial hip joint: Secondary | ICD-10-CM | POA: Diagnosis not present

## 2022-06-30 DIAGNOSIS — M170 Bilateral primary osteoarthritis of knee: Secondary | ICD-10-CM

## 2022-06-30 DIAGNOSIS — M25532 Pain in left wrist: Secondary | ICD-10-CM

## 2022-06-30 DIAGNOSIS — M25531 Pain in right wrist: Secondary | ICD-10-CM | POA: Diagnosis not present

## 2022-06-30 DIAGNOSIS — G5603 Carpal tunnel syndrome, bilateral upper limbs: Secondary | ICD-10-CM

## 2022-06-30 DIAGNOSIS — M25552 Pain in left hip: Secondary | ICD-10-CM

## 2022-06-30 DIAGNOSIS — G8929 Other chronic pain: Secondary | ICD-10-CM

## 2022-06-30 NOTE — Patient Instructions (Signed)
Injected knees and nerve in hip See me again in 5-6 weeks for wrist injections

## 2022-06-30 NOTE — Assessment & Plan Note (Signed)
Bilateral injections again today.  Tolerated the procedure well.  Chronic problem with exacerbation.  Discussed with patient about icing regimen and home exercises.  Patient is going to increase activity as tolerated.  Patient wants to avoid surgical intervention.  Follow-up with me again for this problem in 2 to 3 months.

## 2022-06-30 NOTE — Assessment & Plan Note (Signed)
Continues to have chronic problems.  Discussed again about the possibility of surgical intervention.  Patient states that she does not want to do this.  Would like injections again.  We will try to spread them out and have patient follow-up again in 4 to 6 weeks.  Total time with patient 31 minutes

## 2022-06-30 NOTE — Assessment & Plan Note (Signed)
Patient did tolerate the injection previously a year ago and hopefully will get as much improvement again.  The patient does have some lateral radicular symptoms as well and we will need to monitor patient's discussed with patient about home exercises, which activities to do and which ones to avoid.  Increase activity slowly otherwise.  Follow-up again in 6 to 8 weeks

## 2022-07-01 ENCOUNTER — Ambulatory Visit: Payer: Self-pay

## 2022-07-01 NOTE — Patient Outreach (Signed)
  Care Coordination   07/01/2022 Name: Cathy [name] MRN: [id number] DOB: 10/06/1946   Care Coordination Outreach Attempts:  An unsuccessful telephone outreach was attempted today to offer the patient information about available care coordination services as a benefit of their health plan.   Follow Up Plan:  Additional outreach attempts will be made to offer the patient care coordination information and services.   Encounter Outcome:  No Answer  Care Coordination Interventions Activated:  No   Care Coordination Interventions:  No, not indicated    Daneen Schick, BSW, CDP Social Worker, Certified Dementia Practitioner Care Coordination [phone]

## 2022-07-04 ENCOUNTER — Other Ambulatory Visit: Payer: Self-pay | Admitting: Adult Health

## 2022-07-08 DIAGNOSIS — Z96643 Presence of artificial hip joint, bilateral: Secondary | ICD-10-CM | POA: Diagnosis not present

## 2022-07-09 ENCOUNTER — Ambulatory Visit: Payer: Self-pay

## 2022-07-09 NOTE — Patient Outreach (Signed)
  Care Coordination   07/09/2022 Name: Cathy [name] MRN: [id number] DOB: 1946/04/20   Care Coordination Outreach Attempts:  A second unsuccessful outreach was attempted today to offer the patient with information about available care coordination services as a benefit of their health plan.     Follow Up Plan:  Additional outreach attempts will be made to offer the patient care coordination information and services.   Encounter Outcome:  No Answer  Care Coordination Interventions Activated:  No   Care Coordination Interventions:  No, not indicated    Daneen Schick, BSW, CDP Social Worker, Certified Dementia Practitioner Care Coordination [phone]

## 2022-07-13 ENCOUNTER — Other Ambulatory Visit: Payer: Self-pay | Admitting: *Deleted

## 2022-07-13 NOTE — Patient Outreach (Signed)
  Care Coordination   07/13/2022 Name: Cathy [name] MRN: [id number] DOB: 1946-08-03   Care Coordination Outreach Attempts:  A third unsuccessful outreach was attempted today to offer the patient with information about available care coordination services as a benefit of their health plan.   Follow Up Plan:  No further outreach attempts will be made at this time. We have been unable to contact the patient to offer or enroll patient in care coordination services  Encounter Outcome:  No Answer  Care Coordination Interventions Activated:  No   Care Coordination Interventions:  No, not indicated    Raina Mina, RN Care Management Coordinator [place] Office [phone]

## 2022-07-14 ENCOUNTER — Ambulatory Visit (INDEPENDENT_AMBULATORY_CARE_PROVIDER_SITE_OTHER): Payer: Medicare Other

## 2022-07-14 VITALS — Ht 63.0 in | Wt 144.0 lb

## 2022-07-14 DIAGNOSIS — Z0000 Encounter for general adult medical examination without abnormal findings: Secondary | ICD-10-CM

## 2022-07-14 NOTE — Patient Instructions (Addendum)
Cathy [name] , Thank you for taking time to come for your Medicare Wellness Visit. I appreciate your ongoing commitment to your health goals. Please review the following plan we discussed and let me know if I can assist you in the future.   These are the goals we discussed:  Goals       Patient Stated (pt-stated)      I will continue to take my medications as prescribed.         This is a list of the screening recommended for you and due dates:  Health Maintenance  Topic Date Due   Eye exam for diabetics  06/23/2021   Hemoglobin A1C  02/10/2022   Flu Shot  06/23/2022   COVID-19 Vaccine (3 - Moderna series) 07/30/2022*   Tetanus Vaccine  07/15/2023*   Complete foot exam   08/13/2022   Mammogram  05/28/2023   Colon Cancer Screening  06/17/2025   Pneumonia Vaccine  Completed   DEXA scan (bone density measurement)  Completed   Hepatitis C Screening: USPSTF Recommendation to screen - Ages 46-79 yo.  Completed   Zoster (Shingles) Vaccine  Completed   HPV Vaccine  Aged Out  *Topic was postponed. The date shown is not the original due date.   Advanced directives: No  Conditions/risks identified: None  Next appointment: Follow up in one year for your annual wellness visit     Preventive Care 65 Years and Older, Female Preventive care refers to lifestyle choices and visits with your health care provider that can promote health and wellness. What does preventive care include? A yearly physical exam. This is also called an annual well check. Dental exams once or twice a year. Routine eye exams. Ask your health care provider how often you should have your eyes checked. Personal lifestyle choices, including: Daily care of your teeth and gums. Regular physical activity. Eating a healthy diet. Avoiding tobacco and drug use. Limiting alcohol use. Practicing safe sex. Taking low-dose aspirin every day. Taking vitamin and mineral supplements as recommended by your health care  provider. What happens during an annual well check? The services and screenings done by your health care provider during your annual well check will depend on your age, overall health, lifestyle risk factors, and family history of disease. Counseling  Your health care provider may ask you questions about your: Alcohol use. Tobacco use. Drug use. Emotional well-being. Home and relationship well-being. Sexual activity. Eating habits. History of falls. Memory and ability to understand (cognition). Work and work Statistician. Reproductive health. Screening  You may have the following tests or measurements: Height, weight, and BMI. Blood pressure. Lipid and cholesterol levels. These may be checked every 5 years, or more frequently if you are over 32 years old. Skin check. Lung cancer screening. You may have this screening every year starting at age 48 if you have a 30-pack-year history of smoking and currently smoke or have quit within the past 15 years. Fecal occult blood test (FOBT) of the stool. You may have this test every year starting at age 38. Flexible sigmoidoscopy or colonoscopy. You may have a sigmoidoscopy every 5 years or a colonoscopy every 10 years starting at age 77. Hepatitis C blood test. Hepatitis B blood test. Sexually transmitted disease (STD) testing. Diabetes screening. This is done by checking your blood sugar (glucose) after you have not eaten for a while (fasting). You may have this done every 1-3 years. Bone density scan. This is done to screen for osteoporosis. You  may have this done starting at age 25. Mammogram. This may be done every 1-2 years. Talk to your health care provider about how often you should have regular mammograms. Talk with your health care provider about your test results, treatment options, and if necessary, the need for more tests. Vaccines  Your health care provider may recommend certain vaccines, such as: Influenza vaccine. This is  recommended every year. Tetanus, diphtheria, and acellular pertussis (Tdap, Td) vaccine. You may need a Td booster every 10 years. Zoster vaccine. You may need this after age 67. Pneumococcal 13-valent conjugate (PCV13) vaccine. One dose is recommended after age 37. Pneumococcal polysaccharide (PPSV23) vaccine. One dose is recommended after age 1. Talk to your health care provider about which screenings and vaccines you need and how often you need them. This information is not intended to replace advice given to you by your health care provider. Make sure you discuss any questions you have with your health care provider. Document Released: 12/06/2015 Document Revised: 07/29/2016 Document Reviewed: 09/10/2015 Elsevier Interactive Patient Education  2017 [employer] Prevention in the Home Falls can cause injuries. They can happen to people of all ages. There are many things you can do to make your home safe and to help prevent falls. What can I do on the outside of my home? Regularly fix the edges of walkways and driveways and fix any cracks. Remove anything that might make you trip as you walk through a door, such as a raised step or threshold. Trim any bushes or trees on the path to your home. Use bright outdoor lighting. Clear any walking paths of anything that might make someone trip, such as rocks or tools. Regularly check to see if handrails are loose or broken. Make sure that both sides of any steps have handrails. Any raised decks and porches should have guardrails on the edges. Have any leaves, snow, or ice cleared regularly. Use sand or salt on walking paths during winter. Clean up any spills in your garage right away. This includes oil or grease spills. What can I do in the bathroom? Use night lights. Install grab bars by the toilet and in the tub and shower. Do not use towel bars as grab bars. Use non-skid mats or decals in the tub or shower. If you need to sit down in  the shower, use a plastic, non-slip stool. Keep the floor dry. Clean up any water that spills on the floor as soon as it happens. Remove soap buildup in the tub or shower regularly. Attach bath mats securely with double-sided non-slip rug tape. Do not have throw rugs and other things on the floor that can make you trip. What can I do in the bedroom? Use night lights. Make sure that you have a light by your bed that is easy to reach. Do not use any sheets or blankets that are too big for your bed. They should not hang down onto the floor. Have a firm chair that has side arms. You can use this for support while you get dressed. Do not have throw rugs and other things on the floor that can make you trip. What can I do in the kitchen? Clean up any spills right away. Avoid walking on wet floors. Keep items that you use a lot in easy-to-reach places. If you need to reach something above you, use a strong step stool that has a grab bar. Keep electrical cords out of the way. Do not use floor  polish or wax that makes floors slippery. If you must use wax, use non-skid floor wax. Do not have throw rugs and other things on the floor that can make you trip. What can I do with my stairs? Do not leave any items on the stairs. Make sure that there are handrails on both sides of the stairs and use them. Fix handrails that are broken or loose. Make sure that handrails are as long as the stairways. Check any carpeting to make sure that it is firmly attached to the stairs. Fix any carpet that is loose or worn. Avoid having throw rugs at the top or bottom of the stairs. If you do have throw rugs, attach them to the floor with carpet tape. Make sure that you have a light switch at the top of the stairs and the bottom of the stairs. If you do not have them, ask someone to add them for you. What else can I do to help prevent falls? Wear shoes that: Do not have high heels. Have rubber bottoms. Are comfortable  and fit you well. Are closed at the toe. Do not wear sandals. If you use a stepladder: Make sure that it is fully opened. Do not climb a closed stepladder. Make sure that both sides of the stepladder are locked into place. Ask someone to hold it for you, if possible. Clearly mark and make sure that you can see: Any grab bars or handrails. First and last steps. Where the edge of each step is. Use tools that help you move around (mobility aids) if they are needed. These include: Canes. Walkers. Scooters. Crutches. Turn on the lights when you go into a dark area. Replace any light bulbs as soon as they burn out. Set up your furniture so you have a clear path. Avoid moving your furniture around. If any of your floors are uneven, fix them. If there are any pets around you, be aware of where they are. Review your medicines with your doctor. Some medicines can make you feel dizzy. This can increase your chance of falling. Ask your doctor what other things that you can do to help prevent falls. This information is not intended to replace advice given to you by your health care provider. Make sure you discuss any questions you have with your health care provider. Document Released: 09/05/2009 Document Revised: 04/16/2016 Document Reviewed: 12/14/2014 Elsevier Interactive Patient Education  2017 [employer].

## 2022-07-14 NOTE — Progress Notes (Signed)
Subjective:   Cathy [name] is a 76 y.o. female who presents for Medicare Annual (Subsequent) preventive examination.  Review of Systems    Virtual Visit via Telephone Note  I connected with  [name] on 07/14/22 at  8:15 AM EDT by telephone and verified that I am speaking with the correct person using two identifiers.  Location: Patient: Home Provider: Office Persons participating in the virtual visit: patient/Nurse Health Advisor   I discussed the limitations, risks, security and privacy concerns of performing an evaluation and management service by telephone and the availability of in person appointments. The patient expressed understanding and agreed to proceed.  Interactive audio and video telecommunications were attempted between this nurse and patient, however failed, due to patient having technical difficulties OR patient did not have access to video capability.  We continued and completed visit with audio only.  Some vital signs may be absent or patient reported.   Criselda Peaches, LPN  Cardiac Risk Factors include: hypertension     Objective:    Today's Vitals   07/14/22 0837  Weight: 144 lb (65.3 kg)  Height: _0  (1.6 m)   Body mass index is 25.51 kg/m.     07/14/2022    8:44 AM 07/16/2021    3:03 PM 01/28/2021   10:15 AM 01/21/2021    8:24 AM 06/10/2020    9:53 AM 04/09/2020    5:00 PM 04/09/2020    7:58 AM  Advanced Directives  Does Patient Have a Medical Advance Directive? No Yes _1   Type of Corporate treasurer of [place];Living will       Copy of [place] in Chart?  No - copy requested       Would patient like information on creating a medical advance directive? No - Patient declined  No - Patient declined  No - Patient declined No - Patient declined No - Patient declined    Current Medications (verified) Outpatient Encounter Medications as of 07/14/2022  Medication Sig   Ascorbic Acid (VITAMIN  C) 1000 MG tablet Take 1,000 mg by mouth daily.   aspirin 81 MG tablet Take 1 tablet (81 mg total) by mouth 2 (two) times daily after a meal. (Patient taking differently: Take 81 mg by mouth daily.)   b complex vitamins capsule Take 1 capsule by mouth daily.   Biotin 1000 MCG tablet Take 1,000 mcg by mouth daily.   Blood Glucose Monitoring Suppl (ONETOUCH VERIO FLEX SYSTEM) w/Device KIT TEST TWICE DAILY   Calcium Carbonate-Vitamin D 600-400 MG-UNIT tablet Take 1 tablet by mouth daily.   Chromium 1000 MCG TABS Take 1,000 mcg by mouth daily.   Cinnamon 500 MG capsule Take 1,000 mg by mouth daily.   CRANBERRY PO Take 2 tablets by mouth daily. 4200 mg   Cyanocobalamin (VITAMIN B-12) 2500 MCG SUBL Place 2,500 mcg under the tongue daily.   cyclobenzaprine (FLEXERIL) 10 MG tablet Take 1 tablet (10 mg total) by mouth 3 (three) times daily as needed for muscle spasms.   ELDERBERRY PO Take 1 capsule by mouth daily.   estradiol (ESTRACE) 0.1 MG/GM vaginal cream Place 1 Applicatorful vaginally every other day. In the evening   fexofenadine (ALLEGRA) 180 MG tablet Take 180 mg by mouth daily.   fluticasone (FLONASE) 50 MCG/ACT nasal spray SHAKE LIQUID AND USE 2 SPRAYS IN EACH NOSTRIL DAILY   gabapentin (NEURONTIN) 100 MG capsule Take 1 capsule (100 mg total) by  mouth at bedtime.   Lancets (ONETOUCH DELICA PLUS [id number]) MISC USE ONCE DAILY   lisinopril (ZESTRIL) 10 MG tablet TAKE 1 TABLET(10 MG) BY MOUTH DAILY   Misc Natural Products (TART CHERRY ADVANCED) CAPS Take 2 capsules by mouth daily.   Multiple Vitamins-Minerals (MULTIVITAMIN WITH MINERALS) tablet Take 1 tablet by mouth daily. Centrum silver   ONETOUCH VERIO test strip USE TWICE DAILY   OVER THE COUNTER MEDICATION Take 1 tablet by mouth daily as needed (constipation). Vital Lax   polyethylene glycol (MIRALAX / GLYCOLAX) packet Take 17 g by mouth daily as needed for mild constipation.   rosuvastatin (CRESTOR) 40 MG tablet TAKE 1 TABLET(40 MG) BY  MOUTH DAILY   tiZANidine (ZANAFLEX) 2 MG tablet Take 1 tablet (2 mg total) by mouth at bedtime.   Turmeric 500 MG CAPS Take 1,000 mg by mouth daily.    [DISCONTINUED] valsartan (DIOVAN) 160 MG tablet Take 1/2 tablet by mouth once daily   No facility-administered encounter medications on file as of 07/14/2022.    Allergies (verified) Flexeril [cyclobenzaprine] and Penicillins   History: Past Medical History:  Diagnosis Date   Chronic constipation    DJD (degenerative joint disease)    knees   Female cystocele    GERD (gastroesophageal reflux disease)    History of esophagitis    History of left inguinal hernia    History of MRSA infection    recurrent carbuncle   History of recurrent UTIs    Hyperlipidemia    Hypertension    Pre-diabetes    diet controlled   Urgency of urination    Past Surgical History:  Procedure Laterality Date   APPENDECTOMY  1980's   BREAST EXCISIONAL BIOPSY Left 1989   COLONOSCOPY  last one 06-18-2015   INGUINAL HERNIA REPAIR Left 05-10-2001   LAPAROSCOPIC LYSIS OF ADHESIONS  01/17/2019   OOPHORECTOMY Right 01/17/2019   PERINEOPLASTY  01/17/2019   SALPINGECTOMY Left 01/17/2019   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 SACROSPINUS FIXATION, AUGMENTATION WITH AXIS DERMIS;  Surgeon: Carolan Clines, MD;  Location: [facility];  Service: Urology;  Laterality: N/A;   Family History  Problem Relation Age of Onset   Aneurysm Mother 17       deceased secondary to  brain aneurysm   Liver cancer Father 80       deceased   Diabetes Other        grandmother   Colon cancer Neg Hx    Stomach cancer Neg Hx    Rectal cancer Neg Hx    Esophageal cancer Neg Hx    Breast cancer Neg Hx    Social History   Socioeconomic History   Marital status: Single    Spouse name: Not on file   Number of children: Not on file   Years of education: Not on file   Highest education level: Not on file  Occupational History   Not on file  Tobacco Use   Smoking status: Former    Years: 1.00    Types: Cigarettes  Quit date: 02/28/1996    Years since quitting: 26.3   Smokeless tobacco: Never  Vaping Use   Vaping Use: Never used  Substance and Sexual Activity   Alcohol use: Yes    Alcohol/week: 4.0 standard drinks of alcohol    Types: 4 Standard drinks or equivalent per week    Comment: on weekends   Drug use: No   Sexual activity: Not on file  Other Topics Concern   Not on file  Social History Narrative   Single   Former Smoker  -  quit 10 to 11 years ago (light smoker)   Alcohol use-yes     2 children    Occupation: [employer]    Social Determinants of Health   Financial Resource Strain: [place]  (07/14/2022)   Overall Financial Resource Strain (CARDIA)    Difficulty of Paying Living Expenses: Not hard at all  Food Insecurity: No Food Insecurity (07/14/2022)   Hunger Vital Sign    Worried About Running Out of Food in the Last Year: Never true    [place] in the Last Year: Never true  Transportation Needs: No Transportation Needs (07/14/2022)   PRAPARE - Hydrologist (Medical): No    Lack of Transportation (Non-Medical): No  Physical Activity: Sufficiently Active (07/14/2022)   Exercise Vital Sign    Days of Exercise per Week: 3 days    Minutes of Exercise per Session: 70 min  Stress: No Stress Concern Present (07/14/2022)   [place]     Feeling of Stress : Not at all  Social Connections: Moderately Integrated (07/14/2022)   Social Connection and Isolation Panel [NHANES]    Frequency of Communication with Friends and Family: More than three times a week    Frequency of Social Gatherings with Friends and Family: More than three times a week    Attends Religious Services: More than 4 times per year    Active Member of [employer] or Organizations: Yes    Attends Music therapist: More than 4 times per year    Marital Status: Never married    Tobacco Counseling Counseling given: Not Answered   Clinical Intake:  Pre-visit preparation completed: NoHow often do you need to have someone help you when you read instructions, pamphlets, or other written materials from your doctor or pharmacy?: 1 - Never  Diabetic?  No  Interpreter Needed?: No  Activities of Daily Living    07/14/2022    8:42 AM  In your present state of health, do you have any difficulty performing the following activities:  Hearing? 0  Vision? 0  Difficulty concentrating or making decisions? 0  Walking or climbing stairs? 0  Dressing or bathing? 0  Doing errands, shopping? 0  Preparing Food and eating ? N  Using the Toilet? N  In the past six months, have you accidently leaked urine? N  Do you have problems with loss of bowel control? N  Managing your Medications? N  Managing your Finances? N  Housekeeping or managing your Housekeeping? N    Patient Care Team: [name], NP as PCP - General (Family Medicine) Lucas Mallow, MD as Consulting Physician (Urology)  Indicate any recent Medical Services you may have received from other than [facility] providers in the past year (date may be approximate).     Assessment:   This is a routine wellness examination for Jaydalee.  Hearing/Vision screen Hearing Screening - Comments:: No hearing difficulty Vision Screening - Comments:: No vision difficulty.Followed by [facility]  Dietary issues and exercise activities discussed: Exercise limited by: None identified   Goals Addressed               This Visit's Progress     Patient Stated (pt-stated)        I will continue to take my medications as prescribed.        Depression Screen    07/14/2022    8:41 AM 08/13/2021    7:11 AM 11/01/2020   10:53 AM 07/30/2020   10:15 AM 06/10/2020    9:57 AM 07/28/2019    7:44 AM 07/01/2017    7:52 AM  PHQ 2/9 Scores  PHQ - 2 Score 0 1 0 0 0 0 0  PHQ- 9 Score  1   0      Fall Risk    07/14/2022    8:43 AM 11/27/2021    7:30 AM 11/01/2020   10:53 AM 06/10/2020    9:55 AM 07/28/2019    7:44 AM  Fall Risk   Falls in the past year? 1 0 0 0 0  Number falls in past yr: 0 0  0   Injury with Fall? 0 0  0   Risk for fall due to : No Fall Risks   History of fall(s);Medication side effect;Orthopedic patient   Follow up    Falls evaluation completed;Falls prevention discussed     FALL RISK PREVENTION PERTAINING TO THE HOME:  Any stairs in or around the home? No  If so, are there any without handrails? No  Home free of loose throw rugs in walkways, pet beds, electrical cords, etc? Yes  Adequate lighting in your home to reduce risk of falls? Yes   ASSISTIVE DEVICES UTILIZED TO PREVENT FALLS:  Life alert? No  Use of a cane, walker or w/c? No  Grab bars in the bathroom? Yes  Shower chair or bench in shower? No  Elevated toilet seat or a handicapped toilet? No   TIMED UP AND GO:  Was the test performed? No . Audio Visit  Cognitive Function:        07/14/2022    8:44 AM 06/10/2020   10:00 AM  6CIT Screen  What Year? 0 points 0 points  What month? 0 points 0 points  What time? 0 points 0 points  Count back from 20 0 points 0 points  Months in reverse 0 points 0 points  Repeat phrase 0 points 2 points  Total Score 0 points 2 points    Immunizations Immunization History  Administered Date(s) Administered   Fluad Quad(high Dose 65+) 07/28/2019,  07/30/2020, 08/13/2021   Influenza Split 09/22/2011, 11/02/2012   Influenza Whole 08/27/2008, 08/29/2010   Influenza, High Dose Seasonal PF 08/26/2017, 08/26/2017, 08/06/2018   Influenza,inj,Quad PF,6+ Mos 10/18/2013, 07/17/2014   Moderna Sars-Covid-2 Vaccination 12/17/2019, 01/14/2020   Pneumococcal Conjugate-13 10/10/2015   Pneumococcal Polysaccharide-23 12/18/2011, 07/01/2017, 08/28/2017, 08/28/2017   Td 12/19/2007   Zoster Recombinat (Shingrix) 08/06/2018, 08/06/2018, 10/06/2018, 10/10/2018, 10/10/2018   Zoster, Live 03/07/2015    TDAP status: Due, Education has been provided regarding the importance of this vaccine. Advised may receive this vaccine at local pharmacy or Health Dept. Aware to provide a copy of the vaccination record if obtained from local pharmacy or Health Dept. Verbalized acceptance and understanding.  Flu Vaccine status: Up to date  Pneumococcal vaccine status: Up to date  Covid-19 vaccine status: Completed vaccines  Qualifies for Shingles Vaccine? Yes   Zostavax completed Yes   Shingrix Completed?: Yes  Screening Tests Health Maintenance  Topic Date Due   OPHTHALMOLOGY EXAM  06/23/2021   HEMOGLOBIN A1C  02/10/2022   INFLUENZA VACCINE  06/23/2022   COVID-19 Vaccine (3 - Moderna series) 07/30/2022 (Originally 03/10/2020)   TETANUS/TDAP  07/15/2023 (Originally 12/18/2017)   FOOT EXAM  08/13/2022   MAMMOGRAM  05/28/2023   COLONOSCOPY (Pts 45-28yr Insurance coverage will need to be confirmed)  06/17/2025   Pneumonia Vaccine 76 Years old  Completed   DEXA SCAN  Completed   Hepatitis C Screening  Completed   Zoster Vaccines- Shingrix  Completed   HPV VACCINES  Aged Out    Health Maintenance  Health Maintenance Due  Topic Date Due   OPHTHALMOLOGY EXAM  06/23/2021   HEMOGLOBIN A1C  02/10/2022   INFLUENZA VACCINE  06/23/2022    Colorectal cancer screening: Type of screening: Colonoscopy. Completed 06/18/15. Repeat every 10 years  Mammogram status:  Completed 05/27/22. Repeat every year  Bone Density status: Completed 08/05/21. Results reflect: Bone density results: OSTEOPOROSIS. Repeat every   years.  Lung Cancer Screening: (Low Dose CT Chest recommended if Age 74-80years, 30 pack-year currently smoking OR have quit w/in 15years.) does not qualify.     Additional Screening:  Hepatitis C Screening: does qualify; Completed 10/28/15  Vision Screening: Recommended annual ophthalmology exams for early detection of glaucoma and other disorders of the eye. Is the patient up to date with their annual eye exam?  No  Who is the provider or what is the name of the [facility] in which the patient [facility]ends [facility] exams[facility] is not established with a provider, would they like to be referred to a provider to establish care? No .   Dental Screening: Recommended annual dental exams for proper oral hygiene  Community Resource Referral / Chronic Care Management:  CRR required this visit?  No   CCM required this visit?  No      Plan:     I have personally reviewed and noted the following in the patient's chart:   Medical and social history Use of alcohol, tobacco or illicit drugs  Current medications and supplements including opioid prescriptions. Patient is not currently taking opioid prescriptions. Functional ability and status Nutritional status Physical activity Advanced directives List of other physicians Hospitalizations, surgeries, and ER visits in previous 12 months Vitals Screenings to include cognitive, depression, and falls Referrals and appointments  In addition, I have reviewed and discussed with patient certain preventive protocols, quality metrics, and best practice recommendations. A written personalized care plan for preventive services as well as general preventive health recommendations were provided to patient.     BCriselda Peaches LPN   [id number]  Nurse Notes: None

## 2022-08-12 NOTE — Progress Notes (Signed)
Cathy [name] [address] [place] Phone: [phone] Subjective:   [name], am serving as a scribe for Dr. Hulan Saas.  I'm seeing this patient by the request  of:  [name], NP  CC: Bilateral wrist.  Knee pain  [id number]  06/30/2022 Patient did tolerate the injection previously a year ago and hopefully will get as much improvement again.  The patient does have some lateral radicular symptoms as well and we will need to monitor patient's discussed with patient about home exercises, which activities to do and which ones to avoid.  Increase activity slowly otherwise.  Follow-up again in 6 to 8 weeks  Continues to have chronic problems.  Discussed again about the possibility of surgical intervention.  Patient states that she does not want to do this.  Would like injections again.  We will try to spread them out and have patient follow-up again in 4 to 6 weeks.  Total time with patient 31 minutes  Update 08/14/2022 HINLEY BRIMAGE is a 76 y.o. female coming in with complaint of B wrist and B knee pain. Patient states doing well overall. Knees are good. Left one is giving her a fit every now and again, but doing well. Wants injections in wrist today. No new complaints     Past Medical History:  Diagnosis Date   Chronic constipation    DJD (degenerative joint disease)    knees   Female cystocele    GERD (gastroesophageal reflux disease)    History of esophagitis    History of left inguinal hernia    History of MRSA infection    recurrent carbuncle   History of recurrent UTIs    Hyperlipidemia    Hypertension    Pre-diabetes    diet controlled   Urgency of urination    Past Surgical History:  Procedure Laterality Date   APPENDECTOMY  1980's   BREAST EXCISIONAL BIOPSY Left 1989   COLONOSCOPY  last one 06-18-2015   INGUINAL HERNIA REPAIR Left 05-10-2001   LAPAROSCOPIC LYSIS OF ADHESIONS  01/17/2019    OOPHORECTOMY Right 01/17/2019   PERINEOPLASTY  01/17/2019   SALPINGECTOMY Left 01/17/2019   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 SACROSPINUS FIXATION, AUGMENTATION WITH AXIS DERMIS;  Surgeon: Carolan Clines, MD;  Location: [facility];  Service: Urology;  Laterality: N/A;   Social History   Socioeconomic History   Marital status: Single    Spouse name: Not on file   Number of children: Not on file   Years of education: Not on file   Highest education level: Not on file  Occupational History   Not on file  Tobacco Use   Smoking status: Former    Years: 1.00    Types: Cigarettes    Quit date: 02/28/1996    Years since quitting: 26.4   Smokeless tobacco: Never  Vaping Use   Vaping Use: Never used  Substance and Sexual Activity   Alcohol use: Yes    Alcohol/week: 4.0 standard drinks  of alcohol    Types: 4 Standard drinks or equivalent per week    Comment: on weekends   Drug use: No   Sexual activity: Not on file  Other Topics Concern   Not on file  Social History Narrative   Single   Former Smoker  -  quit 10 to 11 years ago (light smoker)   Alcohol use-yes     2 children    Occupation: [employer]    Social Determinants of Health   Financial Resource Strain: Low Risk  (07/14/2022)   Overall Financial Resource Strain (CARDIA)    Difficulty of Paying Living Expenses: Not hard at all  Food Insecurity: No Food Insecurity (07/14/2022)   Hunger Vital Sign    Worried About  Running Out of Food in the Last Year: Never true    [place] in the Last Year: Never true  Transportation Needs: No Transportation Needs (07/14/2022)   PRAPARE - Hydrologist (Medical): No    Lack of Transportation (Non-Medical): No  Physical Activity: Sufficiently Active (07/14/2022)   Exercise Vital Sign    Days of Exercise per Week: 3 days    Minutes of Exercise per Session: 70 min  Stress: No Stress Concern Present (07/14/2022)   [place]    Feeling of Stress : Not at all  Social Connections: Moderately Integrated (07/14/2022)   Social Connection and Isolation Panel [NHANES]    Frequency of Communication with Friends and Family: More than three times a week    Frequency of Social Gatherings with Friends and Family: More than three times a week    Attends Religious Services: More than 4 times per year    Active Member of [employer] or Organizations: Yes    Attends Music therapist: More than 4 times per year    Marital Status: Never married   Allergies  Allergen Reactions   Flexeril [Cyclobenzaprine]     Hallucinations    Penicillins Hives    Has patient had a PCN reaction causing immediate rash, facial/tongue/throat swelling, SOB or lightheadedness with hypotension: Yes Has patient had a PCN reaction causing severe rash involving mucus membranes or skin necrosis: No Has patient had a PCN reaction that required hospitalization: No Has patient had a PCN reaction occurring within the last 10 years: No If all of the above answers are "NO", then may proceed with Cephalosporin use.    Family History  Problem Relation Age of Onset   Aneurysm Mother 22       deceased secondary to brain aneurysm   Liver cancer Father 58       deceased   Diabetes Other        grandmother   Colon cancer Neg Hx    Stomach cancer Neg Hx    Rectal cancer Neg Hx    Esophageal cancer Neg Hx     Breast cancer Neg Hx      Current Outpatient Medications (Cardiovascular):    lisinopril (ZESTRIL) 10 MG tablet, TAKE 1 TABLET(10 MG) BY MOUTH DAILY   rosuvastatin (CRESTOR) 40 MG tablet, TAKE 1 TABLET(40 MG) BY MOUTH DAILY  Current Outpatient Medications (Respiratory):    fexofenadine (ALLEGRA) 180 MG tablet, Take 180 mg by mouth daily.   fluticasone (FLONASE) 50 MCG/ACT nasal spray, SHAKE LIQUID AND USE 2 SPRAYS IN EACH NOSTRIL DAILY  Current Outpatient Medications (Analgesics):    aspirin 81 MG  tablet, Take 1 tablet (81 mg total) by mouth 2 (two) times daily after a meal. (Patient taking differently: Take 81 mg by mouth daily.)  Current Outpatient Medications (Hematological):    Cyanocobalamin (VITAMIN B-12) 2500 MCG SUBL, Place 2,500 mcg under the tongue daily.  Current Outpatient Medications (Other):    Ascorbic Acid (VITAMIN C) 1000 MG tablet, Take 1,000 mg by mouth daily.   b complex vitamins capsule, Take 1 capsule by mouth daily.   Biotin 1000 MCG tablet, Take 1,000 mcg by mouth daily.   Blood Glucose Monitoring Suppl (ONETOUCH VERIO FLEX SYSTEM) w/Device KIT, TEST TWICE DAILY   Calcium Carbonate-Vitamin D 600-400 MG-UNIT tablet, Take 1 tablet by mouth daily.   Chromium 1000 MCG TABS, Take 1,000 mcg by mouth daily.   Cinnamon 500 MG capsule, Take 1,000 mg by mouth daily.   CRANBERRY PO, Take 2 tablets by mouth daily. 4200 mg   cyclobenzaprine (FLEXERIL) 10 MG tablet, Take 1 tablet (10 mg total) by mouth 3 (three) times daily as needed for muscle spasms.   ELDERBERRY PO, Take 1 capsule by mouth daily.   estradiol (ESTRACE) 0.1 MG/GM vaginal cream, Place 1 Applicatorful vaginally every other day. In the evening   gabapentin (NEURONTIN) 100 MG capsule, Take 1 capsule (100 mg total) by mouth at bedtime.   Lancets (ONETOUCH DELICA PLUS [id number]) MISC, USE ONCE DAILY   Misc Natural Products (TART CHERRY ADVANCED) CAPS, Take 2 capsules by mouth daily.   Multiple Vitamins-Minerals  (MULTIVITAMIN WITH MINERALS) tablet, Take 1 tablet by mouth daily. Centrum silver   ONETOUCH VERIO test strip, USE TWICE DAILY   OVER THE COUNTER MEDICATION, Take 1 tablet by mouth daily as needed (constipation). Vital Lax   polyethylene glycol (MIRALAX / GLYCOLAX) packet, Take 17 g by mouth daily as needed for mild constipation.   tiZANidine (ZANAFLEX) 2 MG tablet, Take 1 tablet (2 mg total) by mouth at bedtime.   Turmeric 500 MG CAPS, Take 1,000 mg by mouth daily.    Reviewed prior external information including notes and imaging from  primary care provider As well as notes that were available from care everywhere and other healthcare systems.  Past medical history, social, surgical and family history all reviewed in electronic medical record.  No pertanent information unless stated regarding to the chief complaint.   Review of Systems:  No headache, visual changes, nausea, vomiting, diarrhea, constipation, dizziness, abdominal pain, skin rash, fevers, chills, night sweats, weight loss, swollen lymph nodes, body aches, joint swelling, chest pain, shortness of breath, mood changes. POSITIVE muscle aches  Objective  Blood pressure (!) 142/82, pulse 81, height 5' 3"  (1.6 m), weight 144 lb (65.3 kg), SpO2 99 %.   General: No apparent distress alert and oriented x3 mood and affect normal, dressed appropriately.  HEENT: Pupils equal, extraocular movements intact  Respiratory: Patient's speak in full sentences and does not appear short of breath  Cardiovascular: No lower extremity edema, non tender, no erythema  Bilateral wrist exam show the patient does have a positive Tinel's noted.  Mild tenderness wasting noted. Patient seems to have arthritic changes but no significant swelling today.  Procedure: Real-time Ultrasound Guided Injection of right carpal tunnel Device: GE Logiq Q7 Ultrasound guided injection is preferred based studies that show increased duration, increased effect, greater  accuracy, decreased procedural pain, increased response rate with ultrasound guided versus blind injection.  Verbal informed consent obtained.  Time-out conducted.  Noted no overlying erythema, induration, or other signs of local infection.  Skin prepped in a sterile fashion.  Local anesthesia: Topical Ethyl chloride.  With sterile technique and under real time ultrasound guidance:  median nerve visualized.  23g 5/8 inch needle inserted distal to proximal approach into nerve sheath. Pictures taken nfor needle placement. Patient did have injection of 0.5 cc of 0.5% Marcaine, and 0.5 cc of Kenalog 40 mg/dL. Completed without difficulty  Pain immediately resolved suggesting accurate placement of the medication.  Advised to call if fevers/chills, erythema, induration, drainage, or persistent bleeding.  Impression: Technically successful ultrasound guided injection.  Procedure: Real-time Ultrasound Guided Injection of right carpal tunnel Device: GE Logiq Q7 Ultrasound guided injection is preferred based studies that show increased duration, increased effect, greater accuracy, decreased procedural pain, increased response rate with ultrasound guided versus blind injection.  Verbal informed consent obtained.  Time-out conducted.  Noted no overlying erythema, induration, or other signs of local infection.  Skin prepped in a sterile fashion.  Local anesthesia: Topical Ethyl chloride.  With sterile technique and under real time ultrasound guidance:  median nerve visualized.  23g 5/8 inch needle inserted distal to proximal approach into nerve sheath. Pictures taken nfor needle placement. Patient did have injection of 0.5 cc of 0.5% Marcaine, 0.5 cc of Kenalog 40 mg per Completed without difficulty  Pain immediately resolved suggesting accurate placement of the medication.  Advised to call if fevers/chills, erythema, induration, drainage, or persistent bleeding.  Impression: Technically successful  ultrasound guided injection.   Impression and Recommendations:    The above documentation has been reviewed and is accurate and complete [name], DO

## 2022-08-14 ENCOUNTER — Encounter: Payer: Self-pay | Admitting: Family Medicine

## 2022-08-14 ENCOUNTER — Ambulatory Visit: Payer: Self-pay

## 2022-08-14 ENCOUNTER — Ambulatory Visit (INDEPENDENT_AMBULATORY_CARE_PROVIDER_SITE_OTHER): Payer: Medicare Other | Admitting: Family Medicine

## 2022-08-14 VITALS — BP 142/82 | HR 81 | Ht 63.0 in | Wt 144.0 lb

## 2022-08-14 DIAGNOSIS — M25531 Pain in right wrist: Secondary | ICD-10-CM

## 2022-08-14 DIAGNOSIS — M25532 Pain in left wrist: Secondary | ICD-10-CM

## 2022-08-14 NOTE — Patient Instructions (Addendum)
Injection in wrist today Good to see you!

## 2022-08-18 ENCOUNTER — Encounter: Payer: Medicare Other | Admitting: Adult Health

## 2022-08-18 NOTE — Progress Notes (Deleted)
Subjective:    Patient ID: Cathy [name], female    DOB: [birth year], 76 y.o.   MRN: [id number]  HPI Patient presents for yearly preventative medicine examination. She is a pleasant 76 year old female who  has a past medical history of Chronic constipation, DJD (degenerative joint disease), Female cystocele, GERD (gastroesophageal reflux disease), History of esophagitis, History of left inguinal hernia, History of MRSA infection, History of recurrent UTIs, Hyperlipidemia, Hypertension, Pre-diabetes, and Urgency of urination.  Hypertension-is managed with lisinopril 10 mg daily.  She denies dizziness, lightheadedness, chest pain, or shortness of breath BP Readings from Last 3 Encounters:  08/14/22 (!) 142/82  06/30/22 110/82  06/10/22 (!) 159/87    Hyperlipidemia-currently prescribed Crestor 40 mg daily.  She denies myalgia or fatigue Lab Results  Component Value Date   CHOL 177 08/13/2021   HDL 73.30 08/13/2021   LDLCALC 86 08/13/2021   LDLDIRECT 118.2 11/21/2013   TRIG 90.0 08/13/2021   CHOLHDL 2 08/13/2021    Diabetes mellitus type 2-diet controlled.  She does monitor her blood sugars at home on a routine basis with readings of 120 or below.  She denies hypoglycemic events Lab Results  Component Value Date   HGBA1C 6.5 08/13/2021   Chronic osteoarthritis-history of bilateral total hip replacements in 2021 and 2022.  She does have chronic pain in both knees and back.  She uses Flexeril 10 mg as needed   All immunizations and health maintenance protocols were reviewed with the patient and needed orders were placed.  Appropriate screening laboratory values were ordered for the patient including screening of hyperlipidemia, renal function and hepatic function.   Medication reconciliation,  past medical history, social history, problem list and allergies were reviewed in detail with the patient  Goals were established with regard to weight loss, exercise, and  diet in  compliance with medications Wt Readings from Last 3 Encounters:  08/14/22 144 lb (65.3 kg)  07/14/22 144 lb (65.3 kg)  06/30/22 144 lb (65.3 kg)   Review of Systems  Constitutional: Negative.   HENT: Negative.    Eyes: Negative.   Respiratory: Negative.    Cardiovascular: Negative.   Gastrointestinal: Negative.   Endocrine: Negative.   Genitourinary: Negative.   Musculoskeletal:  Positive for arthralgias and back pain.  Skin: Negative.   Allergic/Immunologic: Negative.   Neurological: Negative.   Hematological: Negative.   Psychiatric/Behavioral: Negative.     Past Medical History:  Diagnosis Date   Chronic constipation    DJD (degenerative joint disease)    knees   Female cystocele    GERD (gastroesophageal reflux disease)    History of esophagitis    History of left inguinal hernia    History of MRSA infection    recurrent carbuncle   History of recurrent UTIs    Hyperlipidemia    Hypertension    Pre-diabetes    diet controlled   Urgency of urination     Social History   Socioeconomic History   Marital status: Single    Spouse name: Not on file   Number of children: Not on file   Years of education: Not on file   Highest education level: Not on file  Occupational History   Not on file  Tobacco Use   Smoking status: Former    Years: 1.00    Types: Cigarettes    Quit date: 02/28/1996    Years since quitting: 26.4   Smokeless tobacco: Never  Vaping Use   Vaping Use: Never  used  Substance and Sexual Activity   Alcohol use: Yes    Alcohol/week: 4.0 standard drinks of alcohol    Types: 4 Standard drinks or equivalent per week    Comment: on weekends   Drug use: No   Sexual activity: Not on file  Other Topics Concern   Not on file  Social History Narrative   Single   Former Smoker  -  quit 10 to 11 years ago (light smoker)   Alcohol use-yes     2 children    Occupation: [employer]    Social Determinants of Health   Financial  Resource Strain: [place]  (07/14/2022)   Overall Financial Resource Strain (CARDIA)    Difficulty of Paying Living Expenses: Not hard at all  Food Insecurity: No Food Insecurity (07/14/2022)   Hunger Vital Sign    Worried About Running Out of Food in the Last Year: Never true    [place] in the Last Year: Never true  Transportation Needs: No Transportation Needs (07/14/2022)   PRAPARE - Hydrologist (Medical): No    Lack of Transportation (Non-Medical): No  Physical Activity: Sufficiently Active (07/14/2022)   Exercise Vital Sign    Days of Exercise per Week: 3 days    Minutes of Exercise per Session: 70 min  Stress: No Stress Concern Present (07/14/2022)   [place]    Feeling of Stress : Not at all  Social Connections: Moderately Integrated (07/14/2022)   Social Connection and Isolation Panel [NHANES]    Frequency of Communication with Friends and Family: More than three times a week    Frequency of Social Gatherings with Friends and Family: More than three times a week    Attends Religious Services: More than 4 times per year    Active Member of Clubs or Organizations: Yes    Attends Archivist Meetings: More than 4 times per year    Marital Status: Never married  Intimate Partner Violence: Not At Risk (07/14/2022)   Humiliation, Afraid, Rape, and Kick questionnaire    Fear of Current or Ex-Partner: No    Emotionally Abused: No    Physically Abused: No    Sexually Abused: No    Past Surgical History:  Procedure Laterality Date   APPENDECTOMY  1980's   BREAST EXCISIONAL BIOPSY Left 1989   COLONOSCOPY  last one 06-18-2015   INGUINAL HERNIA REPAIR Left 05-10-2001   LAPAROSCOPIC LYSIS OF ADHESIONS  01/17/2019   OOPHORECTOMY Right 01/17/2019   PERINEOPLASTY  01/17/2019   SALPINGECTOMY Left 01/17/2019   TOTAL HIP ARTHROPLASTY Right 04/09/2020   Procedure: RIGH TOTAL HIP  ARTHROPLASTY ANTERIOR APPROACH;  Surgeon: Melrose Nakayama, MD;  Location: [facility];  Service: Orthopedics;  Laterality: Right;   TOTAL HIP ARTHROPLASTY Left 01/28/2021   Procedure: LEFT TOTAL HIP ARTHROPLASTY ANTERIOR APPROACH;  Surgeon: Melrose Nakayama, MD;  Location: [facility];  Service: Orthopedics;  Laterality: Left;   VAGINAL HYSTERECTOMY  1980's   VAGINAL PROLAPSE REPAIR N/A 03/02/2016   Procedure: COLOPLAST ANTERIOR  VAULT REPAIR WITH AXIS DERMIS. SACROSPINUS FIXATION, AUGMENTATION WITH AXIS DERMIS;  Surgeon: Carolan Clines, MD;  Location: [facility];  Service: Urology;  Laterality: N/A;    Family History  Problem Relation Age of Onset   Aneurysm Mother 38       deceased secondary to brain aneurysm   Liver cancer Father 70  deceased   Diabetes Other        grandmother   Colon cancer Neg Hx    Stomach cancer Neg Hx    Rectal cancer Neg Hx    Esophageal cancer Neg Hx    Breast cancer Neg Hx     Allergies  Allergen Reactions   Flexeril [Cyclobenzaprine]     Hallucinations    Penicillins Hives    Has patient had a PCN reaction causing immediate rash, facial/tongue/throat swelling, SOB or lightheadedness with hypotension: Yes Has patient had a PCN reaction causing severe rash involving mucus membranes or skin necrosis: No Has patient had a PCN reaction that required hospitalization: No Has patient had a PCN reaction occurring within the last 10 years: No If all of the above answers are "NO", then may proceed with Cephalosporin use.     Current Outpatient Medications on File Prior to Visit  Medication Sig Dispense Refill   Ascorbic Acid (VITAMIN C) 1000 MG tablet Take 1,000 mg by mouth daily.     aspirin 81 MG tablet Take 1 tablet (81 mg total) by mouth 2 (two) times daily after a meal. (Patient taking differently: Take 81 mg by mouth daily.) 60 tablet 0   b complex vitamins capsule Take 1 capsule by mouth daily.     Biotin 1000 MCG tablet Take 1,000 mcg by  mouth daily.     Blood Glucose Monitoring Suppl (ONETOUCH VERIO FLEX SYSTEM) w/Device KIT TEST TWICE DAILY 1 kit 0   Calcium Carbonate-Vitamin D 600-400 MG-UNIT tablet Take 1 tablet by mouth daily.     Chromium 1000 MCG TABS Take 1,000 mcg by mouth daily.     Cinnamon 500 MG capsule Take 1,000 mg by mouth daily.     CRANBERRY PO Take 2 tablets by mouth daily. 4200 mg     Cyanocobalamin (VITAMIN B-12) 2500 MCG SUBL Place 2,500 mcg under the tongue daily.     cyclobenzaprine (FLEXERIL) 10 MG tablet Take 1 tablet (10 mg total) by mouth 3 (three) times daily as needed for muscle spasms. 30 tablet 1   ELDERBERRY PO Take 1 capsule by mouth daily.     estradiol (ESTRACE) 0.1 MG/GM vaginal cream Place 1 Applicatorful vaginally every other day. In the evening  3   fexofenadine (ALLEGRA) 180 MG tablet Take 180 mg by mouth daily.     fluticasone (FLONASE) 50 MCG/ACT nasal spray SHAKE LIQUID AND USE 2 SPRAYS IN EACH NOSTRIL DAILY 48 g 2   gabapentin (NEURONTIN) 100 MG capsule Take 1 capsule (100 mg total) by mouth at bedtime. 90 capsule 0   Lancets (ONETOUCH DELICA PLUS [id number]) MISC USE ONCE DAILY 100 each 3   lisinopril (ZESTRIL) 10 MG tablet TAKE 1 TABLET(10 MG) BY MOUTH DAILY 90 tablet 3   Misc Natural Products (TART CHERRY ADVANCED) CAPS Take 2 capsules by mouth daily.     Multiple Vitamins-Minerals (MULTIVITAMIN WITH MINERALS) tablet Take 1 tablet by mouth daily. Centrum silver     ONETOUCH VERIO test strip USE TWICE DAILY 200 strip 3   OVER THE COUNTER MEDICATION Take 1 tablet by mouth daily as needed (constipation). Vital Lax     polyethylene glycol (MIRALAX / GLYCOLAX) packet Take 17 g by mouth daily as needed for mild constipation.     rosuvastatin (CRESTOR) 40 MG tablet TAKE 1 TABLET(40 MG) BY MOUTH DAILY 90 tablet 0   tiZANidine (ZANAFLEX) 2 MG tablet Take 1 tablet (2 mg total) by mouth at bedtime. 30 tablet  0   Turmeric 500 MG CAPS Take 1,000 mg by mouth daily.      [DISCONTINUED] valsartan  (DIOVAN) 160 MG tablet Take 1/2 tablet by mouth once daily 30 tablet 5   No current facility-administered medications on file prior to visit.    There were no vitals taken for this visit.      Objective:   Physical Exam Vitals and nursing note reviewed.  Constitutional:      General: She is not in acute distress.    Appearance: Normal appearance. She is well-developed. She is not ill-appearing.  HENT:     Head: Normocephalic and atraumatic.     Right Ear: Tympanic membrane, ear canal and external ear normal. There is no impacted cerumen.     Left Ear: Tympanic membrane, ear canal and external ear normal. There is no impacted cerumen.     Nose: Nose normal. No congestion or rhinorrhea.     Mouth/Throat:     Mouth: Mucous membranes are moist.     Pharynx: Oropharynx is clear. No oropharyngeal exudate or posterior oropharyngeal erythema.  Eyes:     General:        Right eye: No discharge.        Left eye: No discharge.     Extraocular Movements: Extraocular movements intact.     Conjunctiva/sclera: Conjunctivae normal.     Pupils: Pupils are equal, round, and reactive to light.  Neck:     Thyroid: No thyromegaly.     Vascular: No carotid bruit.     Trachea: No tracheal deviation.  Cardiovascular:     Rate and Rhythm: Normal rate and regular rhythm.     Pulses: Normal pulses.     Heart sounds: Normal heart sounds. No murmur heard.    No friction rub. No gallop.  Pulmonary:     Effort: Pulmonary effort is normal. No respiratory distress.     Breath sounds: Normal breath sounds. No stridor. No wheezing, rhonchi or rales.  Chest:     Chest wall: No tenderness.  Abdominal:     General: Abdomen is flat. Bowel sounds are normal. There is no distension.     Palpations: Abdomen is soft. There is no mass.     Tenderness: There is no abdominal tenderness. There is no right CVA tenderness, left CVA tenderness, guarding or rebound.     Hernia: No hernia is present.  Musculoskeletal:         General: No swelling, tenderness, deformity or signs of injury. Normal range of motion.     Cervical back: Normal range of motion and neck supple.     Right lower leg: No edema.     Left lower leg: No edema.  Lymphadenopathy:     Cervical: No cervical adenopathy.  Skin:    General: Skin is warm and dry.     Coloration: Skin is not jaundiced or pale.     Findings: No bruising, erythema, lesion or rash.  Neurological:     General: No focal deficit present.     Mental Status: She is alert and oriented to person, place, and time.     Cranial Nerves: No cranial nerve deficit.     Sensory: No sensory deficit.     Motor: No weakness.     Coordination: Coordination normal.     Gait: Gait normal.     Deep Tendon Reflexes: Reflexes normal.  Psychiatric:        Mood and Affect: Mood normal.  Behavior: Behavior normal.        Thought Content: Thought content normal.        Judgment: Judgment normal.       Assessment & Plan:

## 2022-08-26 ENCOUNTER — Ambulatory Visit (INDEPENDENT_AMBULATORY_CARE_PROVIDER_SITE_OTHER): Payer: 59 | Admitting: Adult Health

## 2022-08-26 VITALS — BP 120/78 | HR 67 | Temp 97.7°F | Ht 63.0 in | Wt 138.0 lb

## 2022-08-26 DIAGNOSIS — E118 Type 2 diabetes mellitus with unspecified complications: Secondary | ICD-10-CM | POA: Diagnosis not present

## 2022-08-26 DIAGNOSIS — E785 Hyperlipidemia, unspecified: Secondary | ICD-10-CM | POA: Diagnosis not present

## 2022-08-26 DIAGNOSIS — Z0000 Encounter for general adult medical examination without abnormal findings: Secondary | ICD-10-CM

## 2022-08-26 DIAGNOSIS — M159 Polyosteoarthritis, unspecified: Secondary | ICD-10-CM

## 2022-08-26 DIAGNOSIS — I10 Essential (primary) hypertension: Secondary | ICD-10-CM

## 2022-08-26 LAB — CBC WITH DIFFERENTIAL/PLATELET
Basophils Absolute: 0.1 10*3/uL (ref 0.0–0.1)
Basophils Relative: 0.8 % (ref 0.0–3.0)
Eosinophils Absolute: 0.1 10*3/uL (ref 0.0–0.7)
Eosinophils Relative: 1.7 % (ref 0.0–5.0)
HCT: 42.3 % (ref 36.0–46.0)
Hemoglobin: 13.6 g/dL (ref 12.0–15.0)
Lymphocytes Relative: 27.6 % (ref 12.0–46.0)
Lymphs Abs: 2.4 10*3/uL (ref 0.7–4.0)
MCHC: 32.1 g/dL (ref 30.0–36.0)
MCV: 80.9 fl (ref 78.0–100.0)
Monocytes Absolute: 0.6 10*3/uL (ref 0.1–1.0)
Monocytes Relative: 7.2 % (ref 3.0–12.0)
Neutro Abs: 5.5 10*3/uL (ref 1.4–7.7)
Neutrophils Relative %: 62.7 % (ref 43.0–77.0)
Platelets: 314 10*3/uL (ref 150.0–400.0)
RBC: 5.23 Mil/uL — ABNORMAL HIGH (ref 3.87–5.11)
RDW: 15.6 % — ABNORMAL HIGH (ref 11.5–15.5)
WBC: 8.7 10*3/uL (ref 4.0–10.5)

## 2022-08-26 LAB — COMPREHENSIVE METABOLIC PANEL
ALT: 27 U/L (ref 0–35)
AST: 26 U/L (ref 0–37)
Albumin: 4.3 g/dL (ref 3.5–5.2)
Alkaline Phosphatase: 69 U/L (ref 39–117)
BUN: 19 mg/dL (ref 6–23)
CO2: 30 mEq/L (ref 19–32)
Calcium: 9.7 mg/dL (ref 8.4–10.5)
Chloride: 101 mEq/L (ref 96–112)
Creatinine, Ser: 0.97 mg/dL (ref 0.40–1.20)
GFR: 57.83 mL/min — ABNORMAL LOW (ref >60.00)
Glucose, Bld: 98 mg/dL (ref 70–99)
Potassium: 4.3 mEq/L (ref 3.5–5.1)
Sodium: 138 mEq/L (ref 135–145)
Total Bilirubin: 0.6 mg/dL (ref 0.2–1.2)
Total Protein: 6.9 g/dL (ref 6.0–8.3)

## 2022-08-26 LAB — URINALYSIS
Bilirubin Urine: NEGATIVE
Hgb urine dipstick: NEGATIVE
Ketones, ur: NEGATIVE
Leukocytes,Ua: NEGATIVE
Nitrite: NEGATIVE
Specific Gravity, Urine: 1.01 (ref 1.000–1.030)
Total Protein, Urine: NEGATIVE
Urine Glucose: NEGATIVE
Urobilinogen, UA: 0.2 (ref 0.0–1.0)
pH: 7 (ref 5.0–8.0)

## 2022-08-26 LAB — HEMOGLOBIN A1C: Hgb A1c MFr Bld: 6.7 % — ABNORMAL HIGH (ref 4.6–6.5)

## 2022-08-26 LAB — LIPID PANEL
Cholesterol: 174 mg/dL (ref 0–200)
HDL: 71.4 mg/dL (ref >39.00)
LDL Cholesterol: 89 mg/dL (ref 0–99)
NonHDL: 102.21
Total CHOL/HDL Ratio: 2
Triglycerides: 67 mg/dL (ref 0.0–149.0)
VLDL: 13.4 mg/dL (ref 0.0–40.0)

## 2022-08-26 LAB — MICROALBUMIN / CREATININE URINE RATIO
Creatinine,U: 69.3 mg/dL
Microalb Creat Ratio: 1 mg/g (ref 0.0–30.0)
Microalb, Ur: <0.7 mg/dL (ref 0.0–1.9)

## 2022-08-26 LAB — TSH: TSH: 1.35 u[IU]/mL (ref 0.35–5.50)

## 2022-08-26 MED ORDER — FLUTICASONE PROPIONATE 50 MCG/ACT NA SUSP: NASAL | 2 refills | Status: DC

## 2022-08-26 NOTE — Progress Notes (Signed)
Subjective:    Patient ID: Cathy [name], female    DOB: Feb 24, 1946, 76 y.o.   MRN: [id number]  HPI 76 year old female who  has a past medical history of Chronic constipation, DJD (degenerative joint disease), Female cystocele, GERD (gastroesophageal reflux disease), History of esophagitis, History of left inguinal hernia, History of MRSA infection, History of recurrent UTIs, Hyperlipidemia, Hypertension, Pre-diabetes, and Urgency of urination.  Diabetes mellitus-currently diet controlled.  She does not monitor her blood sugars at home on a routine basis but when she does her blood sugar readings are 120 or below.  She denies hypoglycemic events Lab Results  Component Value Date   HGBA1C 6.5 08/13/2021   Hypertension-managed with lisinopril 10 mg daily.  She denies dizziness, lightheadedness, chest pain, or shortness of breath BP Readings from Last 3 Encounters:  08/26/22 120/78  08/14/22 (!) 142/82  06/30/22 110/82   Hyperlipidemia-currently prescribed Crestor 40 mg daily.  She denies myalgia or fatigue Lab Results  Component Value Date   CHOL 177 08/13/2021   HDL 73.30 08/13/2021   LDLCALC 86 08/13/2021   LDLDIRECT 118.2 11/21/2013   TRIG 90.0 08/13/2021   CHOLHDL 2 08/13/2021   Chronic osteoarthritis-history of bilateral total hip replacements in 2021 and 2022.  She continues to have chronic pain in both knees and back.  She does receive epidurals periodically for lumbar radiculopathy.  He uses Tizanidine 2 mg QHS as needed  She has no acute complaints today.   Review of Systems  Constitutional: Negative.   HENT: Negative.    Eyes: Negative.   Respiratory: Negative.    Cardiovascular: Negative.   Gastrointestinal: Negative.   Endocrine: Negative.   Genitourinary: Negative.   Musculoskeletal:  Positive for arthralgias and back pain.  Skin: Negative.   Allergic/Immunologic: Negative.   Neurological: Negative.   Hematological: Negative.   Psychiatric/Behavioral:  Negative.     Past Medical History:  Diagnosis Date   Chronic constipation    DJD (degenerative joint disease)    knees   Female cystocele    GERD (gastroesophageal reflux disease)    History of esophagitis    History of left inguinal hernia    History of MRSA infection    recurrent carbuncle   History of recurrent UTIs    Hyperlipidemia    Hypertension    Pre-diabetes    diet controlled   Urgency of urination     Social History   Socioeconomic History   Marital status: Single    Spouse name: Not on file   Number of children: Not on file   Years of education: Not on file   Highest education level: Not on file  Occupational History   Not on file  Tobacco Use   Smoking status: Former    Years: 1.00    Types: Cigarettes    Quit date: 02/28/1996    Years since quitting: 26.5   Smokeless tobacco: Never  Vaping Use   Vaping Use: Never used  Substance and Sexual Activity   Alcohol use: Yes    Alcohol/week: 4.0 standard drinks of alcohol    Types: 4 Standard drinks or equivalent per week    Comment: on weekends   Drug use: No   Sexual activity: Not on file  Other Topics Concern   Not on file  Social History Narrative   Single   Former Smoker  -  quit 10 to 11 years ago (light smoker)   Alcohol use-yes     2 children  Occupation: [employer] Strain: Low Risk  (07/14/2022)   Overall Financial Resource Strain (CARDIA)    Difficulty of Paying Living Expenses: Not hard at all  Food Insecurity: No Food Insecurity (07/14/2022)   Hunger Vital Sign    Worried About Running Out of Food in the Last Year: Never true    [place] in the Last Year: Never true  Transportation Needs: No Transportation Needs (07/14/2022)   PRAPARE - Hydrologist (Medical): No    Lack of Transportation (Non-Medical): No  Physical Activity: Sufficiently Active (07/14/2022)   Exercise Vital  Sign    Days of Exercise per Week: 3 days    Minutes of Exercise per Session: 70 min  Stress: No Stress Concern Present (07/14/2022)   [place]    Feeling of Stress : Not at all  Social Connections: Moderately Integrated (07/14/2022)   Social Connection and Isolation Panel [NHANES]    Frequency of Communication with Friends and Family: More than three times a week    Frequency of Social Gatherings with Friends and Family: More than three times a week    Attends Religious Services: More than 4 times per year    Active Member of Clubs or Organizations: Yes    Attends Archivist Meetings: More than 4 times per year    Marital Status: Never married  Intimate Partner Violence: Not At Risk (07/14/2022)   Humiliation, Afraid, Rape, and Kick questionnaire    Fear of Current or Ex-Partner: No    Emotionally Abused: No    Physically Abused: No    Sexually Abused: No    Past Surgical History:  Procedure Laterality Date   APPENDECTOMY  1980's   BREAST EXCISIONAL BIOPSY Left 1989   COLONOSCOPY  last one 06-18-2015   INGUINAL HERNIA REPAIR Left 05-10-2001   LAPAROSCOPIC LYSIS OF ADHESIONS  01/17/2019   OOPHORECTOMY Right 01/17/2019   PERINEOPLASTY  01/17/2019   SALPINGECTOMY Left 01/17/2019   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 SACROSPINUS FIXATION, AUGMENTATION WITH AXIS DERMIS;  Surgeon: Carolan Clines, MD;  Location: [facility];  Service: Urology;  Laterality: N/A;    Family History  Problem Relation Age of Onset   Aneurysm Mother 58       deceased secondary to brain aneurysm   Liver cancer Father 53       deceased   Diabetes Other        grandmother   Colon cancer Neg Hx    Stomach cancer Neg Hx    Rectal cancer Neg Hx    Esophageal cancer Neg Hx    Breast cancer Neg Hx     Allergies  Allergen Reactions   Flexeril [Cyclobenzaprine]     Hallucinations    Penicillins Hives    Has patient had a PCN reaction causing immediate rash, facial/tongue/throat swelling, SOB or lightheadedness with hypotension: Yes Has  patient had a PCN reaction causing severe rash involving mucus membranes or skin necrosis: No Has patient had a PCN reaction that required hospitalization: No Has patient had a PCN reaction occurring within the last 10 years: No If all of the above answers are "NO", then may proceed with Cephalosporin use.     Current Outpatient Medications on File Prior to Visit  Medication Sig Dispense Refill   Ascorbic Acid (VITAMIN C) 1000 MG tablet Take 1,000 mg by mouth daily.     aspirin 81 MG tablet Take 1 tablet (81 mg total) by mouth 2 (two) times daily after a meal. (Patient taking differently: Take 81 mg by mouth daily.) 60 tablet 0   b complex vitamins capsule Take 1 capsule by mouth daily.     Biotin 1000 MCG tablet Take 1,000 mcg by mouth daily.     Blood Glucose Monitoring Suppl (ONETOUCH VERIO FLEX SYSTEM) w/Device KIT TEST TWICE DAILY 1 kit 0   Calcium Carbonate-Vitamin D 600-400 MG-UNIT tablet Take 1 tablet by mouth daily.     Chromium 1000 MCG TABS Take 1,000 mcg by mouth daily.     Cinnamon 500 MG capsule Take 1,000 mg by mouth daily.     CRANBERRY PO Take 2 tablets by mouth daily. 4200 mg     Cyanocobalamin (VITAMIN B-12) 2500 MCG SUBL Place 2,500 mcg under the tongue daily.     ELDERBERRY PO Take 1 capsule by mouth daily.     estradiol (ESTRACE) 0.1 MG/GM vaginal cream Place 1  Applicatorful vaginally every other day. In the evening  3   fexofenadine (ALLEGRA) 180 MG tablet Take 180 mg by mouth daily.     fluticasone (FLONASE) 50 MCG/ACT nasal spray SHAKE LIQUID AND USE 2 SPRAYS IN EACH NOSTRIL DAILY 48 g 2   Lancets (ONETOUCH DELICA PLUS [id number]) MISC USE ONCE DAILY 100 each 3   lisinopril (ZESTRIL) 10 MG tablet TAKE 1 TABLET(10 MG) BY MOUTH DAILY 90 tablet 3   Misc Natural Products (TART CHERRY ADVANCED) CAPS Take 2 capsules by mouth daily.     Multiple Vitamins-Minerals (MULTIVITAMIN WITH MINERALS) tablet Take 1 tablet by mouth daily. Centrum silver     ONETOUCH VERIO test strip USE TWICE DAILY 200 strip 3   OVER THE COUNTER MEDICATION Take 1 tablet by mouth daily as needed (constipation). Vital Lax     polyethylene glycol (MIRALAX / GLYCOLAX) packet Take 17 g by mouth daily as needed for mild constipation.     rosuvastatin (CRESTOR) 40 MG tablet TAKE 1 TABLET(40 MG) BY MOUTH DAILY 90 tablet 0   tiZANidine (ZANAFLEX) 2 MG tablet Take 1 tablet (2 mg total) by mouth at bedtime. 30 tablet 0   Turmeric 500 MG CAPS Take 1,000 mg by mouth daily.      gabapentin (NEURONTIN) 100 MG capsule Take 1 capsule (100 mg total) by mouth at bedtime. (Patient not taking: Reported on 08/26/2022) 90 capsule 0   [DISCONTINUED] valsartan (DIOVAN) 160 MG tablet Take 1/2 tablet by mouth once daily 30 tablet 5   No current facility-administered medications on file prior to visit.    BP 120/78 (BP Location: Left Arm, Patient Position: Sitting, Cuff Size: Normal)   Pulse 67   Temp 97.7 F (36.5 C) (Oral)   Ht _0  (1.6 m)   Wt 138 lb (62.6 kg)   SpO2 99%   BMI 24.45 kg/m       Objective:   Physical Exam Vitals and nursing note reviewed.  Constitutional:      General: She is not in acute distress.    Appearance: Normal appearance. She is well-developed. She is not ill-appearing.  HENT:     Head: Normocephalic and atraumatic.     Right Ear: Tympanic membrane, ear canal and  external ear normal. There is no impacted cerumen.     Left Ear: Tympanic membrane, ear canal and external ear normal. There is no impacted cerumen.     Nose: Nose normal. No congestion or rhinorrhea.     Mouth/Throat:     Mouth: Mucous membranes are moist.     Pharynx: Oropharynx is clear. No oropharyngeal exudate or posterior oropharyngeal erythema.  Eyes:     General:        Right eye: No discharge.        Left eye: No discharge.     Extraocular Movements: Extraocular movements intact.     Conjunctiva/sclera: Conjunctivae normal.     Pupils: Pupils are equal, round, and reactive to light.  Neck:     Thyroid: No thyromegaly.     Vascular: No carotid bruit.     Trachea: No tracheal deviation.  Cardiovascular:     Rate and Rhythm: Normal rate and regular rhythm.     Pulses: Normal pulses.     Heart sounds: Normal heart sounds. No murmur heard.    No friction rub. No gallop.  Pulmonary:     Effort: Pulmonary effort is normal. No respiratory distress.     Breath sounds: Normal breath sounds. No stridor. No wheezing, rhonchi or rales.  Chest:     Chest wall: No tenderness.  Abdominal:     General: Abdomen is flat. Bowel sounds are normal. There is no distension.     Palpations: Abdomen is soft. There is no mass.     Tenderness: There is no abdominal tenderness. There is no right CVA tenderness, left CVA tenderness, guarding or rebound.     Hernia: No hernia is present.  Musculoskeletal:        General: No swelling, tenderness, deformity or signs of injury. Normal range of motion.     Cervical back: Normal range of motion and neck supple.     Right lower leg: No edema.     Left lower leg: No edema.  Lymphadenopathy:     Cervical: No cervical adenopathy.  Skin:    General: Skin is warm and dry.     Coloration: Skin is not jaundiced or pale.     Findings: No bruising, erythema, lesion or rash.  Neurological:     General: No focal deficit present.     Mental Status: She is alert  and oriented to person, place, and time.     Cranial Nerves: No cranial nerve deficit.     Sensory: No sensory deficit.     Motor: No weakness.     Coordination: Coordination normal.     Gait: Gait normal.     Deep Tendon Reflexes: Reflexes normal.  Psychiatric:        Mood and Affect: Mood normal.        Behavior: Behavior normal.        Thought Content: Thought content normal.        Judgment: Judgment normal.       Assessment & Plan:  1. Routine general medical examination at a health care facility - Benign exam  - Follow up in one year or sooner if needed - CBC with Differential/Platelet; Future - Comprehensive metabolic  panel; Future - Hemoglobin A1c; Future - Lipid panel; Future - TSH; Future - Microalbumin/Creatinine Ratio, Urine; Future - Urinalysis; Future - fluticasone (FLONASE) 50 MCG/ACT nasal spray; SHAKE LIQUID AND USE 2 SPRAYS IN EACH NOSTRIL DAILY  Dispense: 48 g; Refill: 2  2. Controlled type 2 diabetes mellitus with complication, without long-term current use of insulin ([place]) - Consider Metformin  - CBC with Differential/Platelet; Future - Comprehensive metabolic panel; Future - Hemoglobin A1c; Future - Lipid panel; Future - TSH; Future - Microalbumin/Creatinine Ratio, Urine; Future - Urinalysis; Future  3. Dyslipidemia - Consider increase in statin  - CBC with Differential/Platelet; Future - Comprehensive metabolic panel; Future - Hemoglobin A1c; Future - Lipid panel; Future - TSH; Future - Microalbumin/Creatinine Ratio, Urine; Future - Urinalysis; Future  4. Essential hypertension - well controlled. No change in medication  - CBC with Differential/Platelet; Future - Comprehensive metabolic panel; Future - Hemoglobin A1c; Future - Lipid panel; Future - TSH; Future - Microalbumin/Creatinine Ratio, Urine; Future - Urinalysis; Future  5. Primary osteoarthritis involving multiple joints - Continue with Sports Medicine as needed and Tizanidine  as needed  [name], NP

## 2022-08-31 ENCOUNTER — Telehealth: Payer: Self-pay | Admitting: Adult Health

## 2022-08-31 NOTE — Telephone Encounter (Signed)
Cathy [name] with [facility] [phone] Fax [phone] They have been waiting since 08/17/2022 for a response to their faxed request. Fax number confirmed Cathy [name] will fax request again, just in case.

## 2022-09-09 NOTE — Progress Notes (Signed)
[place] Phone: [phone] Subjective:   Cathy [name], am serving as a scribe for Dr. Hulan Saas.  I'm seeing this patient by the request  of:  [name], NP  CC: Bilateral knee pain  RFV:[id number]  Update 09/11/2022 Cathy [name] is a 76 y.o. female coming in with complaint of B wrist and B knee pain. Injected for wrists for carpal tunnel last visit. Patient approved for Durolane. Epidural July 2023. Patient states that back pain has increased.      Past Medical History:  Diagnosis Date   Chronic constipation    DJD (degenerative joint disease)    knees   Female cystocele    GERD (gastroesophageal reflux disease)    History of esophagitis    History of left inguinal hernia    History of MRSA infection    recurrent carbuncle   History of recurrent UTIs    Hyperlipidemia    Hypertension    Pre-diabetes    diet controlled   Urgency of urination    Past Surgical History:  Procedure Laterality Date   APPENDECTOMY  1980's   BREAST EXCISIONAL BIOPSY Left 1989   COLONOSCOPY  last one 06-18-2015   INGUINAL HERNIA REPAIR Left 05-10-2001   LAPAROSCOPIC LYSIS OF ADHESIONS  01/17/2019   OOPHORECTOMY Right 01/17/2019   PERINEOPLASTY  01/17/2019   SALPINGECTOMY Left 01/17/2019   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 SACROSPINUS FIXATION, AUGMENTATION WITH AXIS DERMIS;  Surgeon: Carolan Clines, MD;  Location: [facility];  Service: Urology;  Laterality: N/A;   Social History   Socioeconomic History   Marital status: Single    Spouse name: Not on file   Number of children: Not on file   Years of education: Not on file   Highest education level: Not on file  Occupational History   Not on file  Tobacco Use   Smoking status: Former    Years: 1.00    Types: Cigarettes    Quit date: 02/28/1996    Years since quitting: 26.5   Smokeless tobacco: Never  Vaping Use   Vaping Use: Never used  Substance and Sexual Activity   Alcohol use: Yes    Alcohol/week: 4.0 standard drinks of alcohol    Types: 4 Standard drinks or equivalent per week    Comment: on weekends   Drug use: [name]   Sexual activity: Not on file  Other Topics Concern   Not on file  Social History Narrative   Single   Former Smoker  -  quit 10 to 11 years ago (light smoker)   Alcohol use-yes     2 children    Occupation: [employer]    Social Determinants of Health   Financial Resource Strain: [place]  (07/14/2022)   Overall Financial Resource Strain (CARDIA)    Difficulty of Paying Living Expenses: Not hard at all  Food Insecurity: [name] Food Insecurity (07/14/2022)   Hunger Vital Sign  Worried About Charity fundraiser in the Last Year: Never true    [place] in the Last Year: Never true  Transportation Needs: [name] Transportation Needs (07/14/2022)   PRAPARE - Hydrologist (Medical): [name]    Lack of Transportation (Non-Medical): [name]  Physical Activity: Sufficiently Active (07/14/2022)   Exercise Vital Sign    Days of Exercise per Week: 3 days    Minutes of Exercise per Session: 70 min  Stress: [name] Stress Concern Present (07/14/2022)   [place]    Feeling of Stress : Not at all  Social Connections: Moderately Integrated (07/14/2022)   Social Connection and Isolation Panel [NHANES]    Frequency of  Communication with Friends and Family: More than three times a week    Frequency of Social Gatherings with Friends and Family: More than three times a week    Attends Religious Services: More than 4 times per year    Active Member of [employer] or Organizations: Yes    Attends Music therapist: More than 4 times per year    Marital Status: Never married   Allergies  Allergen Reactions   Flexeril [Cyclobenzaprine]     Hallucinations    Penicillins Hives    Has patient had a PCN reaction causing immediate rash, facial/tongue/throat swelling, SOB or lightheadedness with hypotension: Yes Has patient had a PCN reaction causing severe rash involving mucus membranes or skin necrosis: [name] Has patient had a PCN reaction that required hospitalization: [name] Has patient had a PCN reaction occurring within the last 10 years: [name] If all of the above answers are "[name]", then may proceed with Cephalosporin use.    Family History  Problem Relation Age of Onset   Aneurysm Mother 43       deceased secondary to brain aneurysm   Liver cancer Father 33       deceased   Diabetes Other        grandmother   Colon cancer Neg Hx    Stomach cancer Neg Hx    Rectal cancer Neg Hx    Esophageal cancer Neg Hx    Breast cancer Neg Hx      Current Outpatient Medications (Cardiovascular):    lisinopril (ZESTRIL) 10 MG tablet, TAKE 1 TABLET(10 MG) BY MOUTH DAILY   rosuvastatin (CRESTOR) 40 MG tablet, TAKE 1 TABLET(40 MG) BY MOUTH DAILY  Current Outpatient Medications (Respiratory):    fexofenadine (ALLEGRA) 180 MG tablet, Take 180 mg by mouth daily.   fluticasone (FLONASE) 50 MCG/ACT nasal spray, SHAKE LIQUID AND USE 2 SPRAYS IN EACH NOSTRIL DAILY  Current Outpatient Medications (Analgesics):    aspirin 81 MG tablet, Take 1 tablet (81 mg total) by mouth 2 (two) times daily after a meal. (Patient taking differently: Take 81 mg by mouth daily.)  Current Outpatient Medications (Hematological):     Cyanocobalamin (VITAMIN B-12) 2500 MCG SUBL, Place 2,500 mcg under the tongue daily.  Current Outpatient Medications (Other):    Ascorbic Acid (VITAMIN C) 1000 MG tablet, Take 1,000 mg by mouth daily.   b complex vitamins capsule, Take 1 capsule by mouth daily.   Biotin 1000 MCG tablet, Take 1,000 mcg by mouth daily.   Blood Glucose Monitoring Suppl (ONETOUCH VERIO FLEX SYSTEM) w/Device KIT, TEST TWICE DAILY   Calcium Carbonate-Vitamin D 600-400 MG-UNIT tablet, Take 1 tablet by mouth daily.   Chromium 1000 MCG TABS, Take 1,000 mcg by mouth  daily.   Cinnamon 500 MG capsule, Take 1,000 mg by mouth daily.   CRANBERRY PO, Take 2 tablets by mouth daily. 4200 mg   ELDERBERRY PO, Take 1 capsule by mouth daily.   estradiol (ESTRACE) 0.1 MG/GM vaginal cream, Place 1 Applicatorful vaginally every other day. In the evening   Lancets (ONETOUCH DELICA PLUS [id number]) MISC, USE ONCE DAILY   Misc Natural Products (TART CHERRY ADVANCED) CAPS, Take 2 capsules by mouth daily.   Multiple Vitamins-Minerals (MULTIVITAMIN WITH MINERALS) tablet, Take 1 tablet by mouth daily. Centrum silver   ONETOUCH VERIO test strip, USE TWICE DAILY   OVER THE COUNTER MEDICATION, Take 1 tablet by mouth daily as needed (constipation). Vital Lax   polyethylene glycol (MIRALAX / GLYCOLAX) packet, Take 17 g by mouth daily as needed for mild constipation.   tiZANidine (ZANAFLEX) 2 MG tablet, Take 1 tablet (2 mg total) by mouth at bedtime.   Turmeric 500 MG CAPS, Take 1,000 mg by mouth daily.    Reviewed prior external information including notes and imaging from  primary care provider As well as notes that were available from care everywhere and other healthcare systems.  Past medical history, social, surgical and family history all reviewed in electronic medical record.  [name] pertanent information unless stated regarding to the chief complaint.   Review of Systems:  [name] headache, visual changes, nausea, vomiting, diarrhea,  constipation, dizziness, abdominal pain, skin rash, fevers, chills, night sweats, weight loss, swollen lymph nodes, body aches, joint swelling, chest pain, shortness of breath, mood changes. POSITIVE muscle aches  Objective  Blood pressure 128/76, pulse 81, height _0  (1.6 m), weight 144 lb (65.3 kg), SpO2 98 %.   General: [name] apparent distress alert and oriented x3 mood and affect normal, dressed appropriately.  HEENT: Pupils equal, extraocular movements intact  Respiratory: Patient's speak in full sentences and does not appear short of breath  Cardiovascular: [name] lower extremity edema, non tender, [name] erythema  Severely antalgic gait noted.  Varus deformity of the knees bilaterally.  Instability of the knees.  Tender to palpation in the knees to even light palpation.  After informed written and verbal consent, patient was seated on exam table. Right knee was prepped with alcohol swab and utilizing anterolateral approach, patient's right knee space was injected with 60 mg/3 mL of Durolane (sodium hyaluronate) in a prefilled syringe was injected easily into the knee through a 22-gauge needle..Patient tolerated the procedure well without immediate complications.  After informed written and verbal consent, patient was seated on exam table. Left knee was prepped with alcohol swab and utilizing anterolateral approach, patient's left knee space was injected with 60 mg per 3 mL of Durolane (sodium hyaluronate) in a prefilled syringe was injected easily into the knee through a 22-gauge needle..Patient tolerated the procedure well without immediate complications.    Impression and Recommendations:    The above documentation has been reviewed and is accurate and complete [name], DO

## 2022-09-11 ENCOUNTER — Ambulatory Visit (INDEPENDENT_AMBULATORY_CARE_PROVIDER_SITE_OTHER): Payer: 59 | Admitting: Family Medicine

## 2022-09-11 ENCOUNTER — Ambulatory Visit: Payer: Self-pay

## 2022-09-11 ENCOUNTER — Encounter: Payer: Self-pay | Admitting: Family Medicine

## 2022-09-11 VITALS — BP 128/76 | HR 81 | Ht 63.0 in | Wt 144.0 lb

## 2022-09-11 DIAGNOSIS — M25562 Pain in left knee: Secondary | ICD-10-CM

## 2022-09-11 DIAGNOSIS — M170 Bilateral primary osteoarthritis of knee: Secondary | ICD-10-CM | POA: Diagnosis not present

## 2022-09-11 DIAGNOSIS — G8929 Other chronic pain: Secondary | ICD-10-CM | POA: Diagnosis not present

## 2022-09-11 DIAGNOSIS — M25561 Pain in right knee: Secondary | ICD-10-CM

## 2022-09-11 DIAGNOSIS — M25531 Pain in right wrist: Secondary | ICD-10-CM

## 2022-09-11 DIAGNOSIS — M5416 Radiculopathy, lumbar region: Secondary | ICD-10-CM | POA: Diagnosis not present

## 2022-09-11 DIAGNOSIS — M25532 Pain in left wrist: Secondary | ICD-10-CM

## 2022-09-11 DIAGNOSIS — M5441 Lumbago with sciatica, right side: Secondary | ICD-10-CM

## 2022-09-11 MED ORDER — SODIUM HYALURONATE 60 MG/3ML IX PRSY
120.0000 mg | PREFILLED_SYRINGE | Freq: Once | INTRA_ARTICULAR | Status: AC
  Administered 2022-09-11: 120 mg via INTRA_ARTICULAR

## 2022-09-11 NOTE — Assessment & Plan Note (Signed)
Chronic problem with worsening symptoms.  Viscosupplementation given again today.  Patient has responded to it previously.  Hoping the patient will have a good response again.  This is a worsening symptom subjective:  CC:   HPI:   Past medical history, Surgical history, Family history not pertinant except as noted below, Social history, Allergies, and medications have been entered into the medical record, reviewed, and no changes needed.   Review of Systems: No headache, visual changes, nausea, vomiting, diarrhea, constipation, dizziness, abdominal pain, skin rash, fevers, chills, night sweats, swollen lymph nodes, weight loss, chest pain, body aches, joint swelling, muscle aches, shortness of breath, mood changes, visual or auditory hallucinations.  Objective: .v General: Well Developed, well nourished, and in no acute distress.  Neuro: Alert and oriented x3, extra-ocular muscles intact, sensation grossly intact.  HEENT: Normocephalic, atraumatic, pupils equal round reactive to light,  Neck: neck supple, no masses, no lymphadenopathy, thyroid nonpalpable.  Skin: Warm and dry, no rashes noted.  Cardiac: no lower extremity edema Respiratory: Not using accessory muscles, speaking in full sentences.  Abdominal: Soft, nontender Musculoskeletal:     Impression and Recommendations: The patient was counselled, risk factors were discussed, anticipatory guidance given.  We did talk about different medications.  Patient was wanting Percocet.  Due to patient's age and fall risk we did discuss that this is a social determinants of health I would not highly recommended.  Did discuss that Zanaflex 2 mg at night that can help with some of the back pain as well.  Follow-up with me again in 2 to 3 months

## 2022-09-11 NOTE — Assessment & Plan Note (Signed)
Chronic problem with worsening symptoms.  Has responded well to epidurals previously.  We discussed the risks and benefits of this.  Hopefully that this will be beneficial as well.  If she does not have significant improvement again can consider the possibility of injection on the side of the hip which would need to be under ultrasound guidance with patient having the hip replacement.  Follow-up with me again 2 to 3 months

## 2022-09-11 NOTE — Patient Instructions (Addendum)
Epidural [phone] Injected both knees today See me again 2-3 months

## 2022-09-15 ENCOUNTER — Ambulatory Visit (INDEPENDENT_AMBULATORY_CARE_PROVIDER_SITE_OTHER): Payer: 59 | Admitting: Family Medicine

## 2022-09-15 ENCOUNTER — Ambulatory Visit (INDEPENDENT_AMBULATORY_CARE_PROVIDER_SITE_OTHER): Payer: 59

## 2022-09-15 ENCOUNTER — Encounter: Payer: Self-pay | Admitting: Family Medicine

## 2022-09-15 DIAGNOSIS — S8992XA Unspecified injury of left lower leg, initial encounter: Secondary | ICD-10-CM | POA: Diagnosis not present

## 2022-09-15 DIAGNOSIS — M25562 Pain in left knee: Secondary | ICD-10-CM
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
## 2022-09-21 ENCOUNTER — Ambulatory Visit
Admission: RE | Admit: 2022-09-21 | Discharge: 2022-09-21 | Disposition: A | Discharge status: Home / Self Care | Payer: 59 | Source: Ambulatory Visit | Attending: Family Medicine | Admitting: Family Medicine

## 2022-09-21 DIAGNOSIS — M5416 Radiculopathy, lumbar region: Secondary | ICD-10-CM

## 2022-09-21 MED ORDER — IOPAMIDOL (ISOVUE-M 200) INJECTION 41%
1.0000 mL | Freq: Once | INTRAMUSCULAR | Status: AC
  Administered 2022-09-21: 1 mL via EPIDURAL

## 2022-09-21 MED ORDER — METHYLPREDNISOLONE ACETATE 40 MG/ML INJ SUSP (RADIOLOG
80.0000 mg | Freq: Once | INTRAMUSCULAR | Status: AC
  Administered 2022-09-21: 80 mg via EPIDURAL

## 2022-09-21 NOTE — Discharge Instructions (Signed)

## 2022-09-23 ENCOUNTER — Other Ambulatory Visit: Payer: Self-pay | Admitting: Adult Health

## 2022-09-29 ENCOUNTER — Encounter: Payer: Self-pay | Admitting: Adult Health

## 2022-09-29 ENCOUNTER — Ambulatory Visit (INDEPENDENT_AMBULATORY_CARE_PROVIDER_SITE_OTHER): Payer: Medicare Other | Admitting: Adult Health

## 2022-09-29 VITALS — BP 120/80 | HR 76 | Temp 98.0°F | Ht 63.0 in | Wt 144.0 lb

## 2022-09-29 DIAGNOSIS — J302 Other seasonal allergic rhinitis: Secondary | ICD-10-CM | POA: Diagnosis not present

## 2022-09-29 DIAGNOSIS — R350 Frequency of micturition: Secondary | ICD-10-CM | POA: Diagnosis not present

## 2022-09-29 DIAGNOSIS — M545 Low back pain, unspecified: Secondary | ICD-10-CM | POA: Diagnosis not present

## 2022-09-29 DIAGNOSIS — M25562 Pain in left knee: Secondary | ICD-10-CM | POA: Diagnosis not present

## 2022-09-29 LAB — POCT URINALYSIS DIPSTICK
Bilirubin, UA: NEGATIVE
Blood, UA: NEGATIVE
Glucose, UA: NEGATIVE
Ketones, UA: NEGATIVE
Leukocytes, UA: NEGATIVE
Nitrite, UA: NEGATIVE
Protein, UA: NEGATIVE
Spec Grav, UA: 1.015 (ref 1.010–1.025)
Urobilinogen, UA: 0.2 E.U./dL
pH, UA: 6 (ref 5.0–8.0)

## 2022-09-29 MED ORDER — LISINOPRIL 10 MG PO TABS: ORAL_TABLET | ORAL | 3 refills | Status: DC

## 2022-09-29 MED ORDER — TIZANIDINE HCL 2 MG PO TABS: 2.0000 mg | ORAL_TABLET | Freq: Every day | ORAL | 0 refills | Status: DC

## 2022-09-29 MED ORDER — FLUTICASONE PROPIONATE 50 MCG/ACT NA SUSP: 2.0000 | Freq: Every day | NASAL | 6 refills | Status: DC
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
## 2022-09-30 ENCOUNTER — Telehealth: Payer: Self-pay | Admitting: Adult Health

## 2022-09-30 MED ORDER — PREDNISONE 10 MG PO TABS: 10.0000 mg | ORAL_TABLET | Freq: Every day | ORAL | 0 refills | Status: AC

## 2022-09-30 NOTE — Addendum Note (Signed)
Addended by: Gwenyth Ober R on: 09/30/2022 02:01 PM   Modules accepted: Orders

## 2022-09-30 NOTE — Telephone Encounter (Signed)
Pt called to say she was seen here yesterday and went to the pharmacy to pick up her medication for sinus pain and pressure and pharmacy told her they have not received anything for her.  Please advise.   [facility] [phone] - [address] AT [facility] [address] [place] Phone: [phone]  Fax: [phone]

## 2022-09-30 NOTE — Telephone Encounter (Signed)
Spoke to pt and advised that Flonase was sent to the pharmacy. Also advised pt that she may already have this Rx on hand since we filled it last month. Pt stated that another medication was suppose to be called in for the chest congestion.    Please advise.

## 2022-10-01 NOTE — Telephone Encounter (Signed)
Been trying to call pt back to advise of update since yesterday no answer.

## 2022-10-02 NOTE — Telephone Encounter (Signed)
Left message to return phone call.

## 2022-10-02 NOTE — Telephone Encounter (Signed)
Pt notified of update via vm.

## 2022-10-02 NOTE — Telephone Encounter (Signed)
Called the pharmacy to see if pt picked up medication but they stated she hasn't. Will close note.

## 2022-10-02 NOTE — Telephone Encounter (Signed)
Pt is returning [place] call

## 2022-10-12 ENCOUNTER — Telehealth: Payer: Self-pay | Admitting: Family Medicine

## 2022-10-12 ENCOUNTER — Other Ambulatory Visit: Payer: Self-pay

## 2022-10-12 MED ORDER — PREDNISONE 20 MG PO TABS: 20.0000 mg | ORAL_TABLET | Freq: Every day | ORAL | 0 refills | Status: DC

## 2022-10-12 NOTE — Telephone Encounter (Signed)
Pt struggling with L hip pain again, states we diagnosed with bursitis. Pt requesting steroid rx to [facility] on file.

## 2022-10-12 NOTE — Telephone Encounter (Signed)
Pt would like to know if Dr. Tamala Julian would recommend a gel injection for her L hip, instead of the steroids.

## 2022-10-12 NOTE — Telephone Encounter (Signed)
Left VM for patient that visco is only approved for knee arthritis and is not available for use in other joints.

## 2022-10-12 NOTE — Telephone Encounter (Signed)
Spoke with patient. Per a verbal from Dr. Tamala Julian she is to use '20mg'$  each day for 5 days of prednisone. Patient voices understanding.

## 2022-10-17 DIAGNOSIS — Z96643 Presence of artificial hip joint, bilateral: Secondary | ICD-10-CM | POA: Diagnosis not present

## 2022-10-17 DIAGNOSIS — M25552 Pain in left hip: Secondary | ICD-10-CM | POA: Diagnosis not present

## 2022-10-17 DIAGNOSIS — M7062 Trochanteric bursitis, left hip: Secondary | ICD-10-CM | POA: Diagnosis not present

## 2022-11-05 ENCOUNTER — Telehealth: Payer: Self-pay | Admitting: *Deleted

## 2022-11-05 NOTE — Patient Outreach (Signed)
  Care Coordination   11/05/2022 Name: YAZHINI MCAULAY MRN: [id number] DOB: 23-May-1946   Care Coordination Outreach Attempts:  An unsuccessful telephone outreach was attempted today to offer the patient information about available care coordination services as a benefit of their health plan.   Follow Up Plan:  Additional outreach attempts will be made to offer the patient care coordination information and services.   Encounter Outcome:  No Answer   Care Coordination Interventions:  No, not indicated    Raina Mina, RN Care Management Coordinator [place] Office [phone]

## 2022-11-06 ENCOUNTER — Ambulatory Visit (INDEPENDENT_AMBULATORY_CARE_PROVIDER_SITE_OTHER): Payer: Medicare Other | Admitting: Family Medicine

## 2022-11-06 ENCOUNTER — Ambulatory Visit: Payer: Self-pay

## 2022-11-06 ENCOUNTER — Encounter: Payer: Self-pay | Admitting: Family Medicine

## 2022-11-06 VITALS — BP 130/82 | HR 97 | Ht 63.0 in | Wt 152.0 lb

## 2022-11-06 DIAGNOSIS — M7062 Trochanteric bursitis, left hip: Secondary | ICD-10-CM

## 2022-11-06 DIAGNOSIS — M25531 Pain in right wrist: Secondary | ICD-10-CM | POA: Diagnosis not present

## 2022-11-06 DIAGNOSIS — Z96642 Presence of left artificial hip joint: Secondary | ICD-10-CM

## 2022-11-06 DIAGNOSIS — M25532 Pain in left wrist: Secondary | ICD-10-CM | POA: Diagnosis not present

## 2022-11-06 DIAGNOSIS — M25552 Pain in left hip: Secondary | ICD-10-CM

## 2022-11-06 DIAGNOSIS — M5441 Lumbago with sciatica, right side: Secondary | ICD-10-CM | POA: Diagnosis not present

## 2022-11-06 DIAGNOSIS — M5416 Radiculopathy, lumbar region: Secondary | ICD-10-CM

## 2022-11-06 DIAGNOSIS — G8929 Other chronic pain: Secondary | ICD-10-CM | POA: Diagnosis not present

## 2022-11-06 NOTE — Patient Instructions (Addendum)
Injected L GT today Epidural [phone] Happy Holidays Increase Vit C to '1000mg'$  See me again in 2-3 months

## 2022-11-06 NOTE — Assessment & Plan Note (Signed)
Concern, but pain in the hip could be secondary to more of the back pain.  Discussed with patient about icing regimen and home exercises.  Will order another epidural which patient has responded to previously as well.

## 2022-11-06 NOTE — Progress Notes (Signed)
[name] Phone: [phone] Subjective:   [name], am serving as a scribe for Dr. Hulan Saas.  I'm seeing this patient by the request  of:  [name], NP  CC: Bilateral hand pain and left hip pain  [id number]  09/11/2022 Chronic problem with worsening symptoms.  Viscosupplementation given again today.  Patient has responded to it previously.  Hoping the patient will have a good response again.  This is a worsening symptom subjective:  CC:      Update 11/06/2022 Cathy [name] is a 76 y.o. female coming in with complaint of B knee, wrist and LBP. Epidural on 09/21/2022. Patient states that her L hip is worse since last visit. Pain radiating down to her knee.        Past Medical History:  Diagnosis Date   Chronic constipation    DJD (degenerative joint disease)    knees   Female cystocele    GERD (gastroesophageal reflux disease)    History of esophagitis    History of left inguinal hernia    History of MRSA infection    recurrent carbuncle   History of recurrent UTIs    Hyperlipidemia    Hypertension    Pre-diabetes    diet controlled   Urgency of urination    Past Surgical History:  Procedure Laterality Date   APPENDECTOMY  1980's   BREAST EXCISIONAL BIOPSY Left 1989   COLONOSCOPY  last one 06-18-2015   INGUINAL HERNIA REPAIR Left 05-10-2001   LAPAROSCOPIC LYSIS OF ADHESIONS  01/17/2019   OOPHORECTOMY Right 01/17/2019   PERINEOPLASTY  01/17/2019   SALPINGECTOMY Left 01/17/2019   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 SACROSPINUS FIXATION, AUGMENTATION WITH AXIS DERMIS;  Surgeon: Carolan Clines, MD;  Location: [facility];  Service: Urology;  Laterality: N/A;   Social History   Socioeconomic History   Marital status: Single    Spouse name: Not on file   Number of children: Not on file   Years of education: Not on file   Highest education level: Not on file  Occupational History   Not on file  Tobacco Use   Smoking status: Former    Years: 1.00    Types: Cigarettes    Quit date: 02/28/1996    Years since quitting: 26.7   Smokeless tobacco: Never  Vaping Use   Vaping Use: Never used  Substance and Sexual Activity   Alcohol use: Yes    Alcohol/week: 4.0 standard drinks of alcohol    Types: 4 Standard drinks or equivalent per week    Comment: on weekends   Drug use: No   Sexual activity: Not on file  Other Topics Concern   Not on file  Social History Narrative   Single   Former Smoker  -  quit 10 to 11 years ago (light smoker)   Alcohol use-yes     2 children    Occupation: [employer]  Determinants of Health   Financial Resource Strain: Low Risk  (07/14/2022)   Overall Financial Resource Strain (CARDIA)    Difficulty of Paying Living Expenses: Not hard at all  Food Insecurity: No Food Insecurity (07/14/2022)   Hunger Vital Sign    Worried About Running Out of Food in the Last Year: Never true    Ran Out of Food in the Last Year: Never true  Transportation Needs: No Transportation Needs (07/14/2022)   PRAPARE - Hydrologist (Medical): No    Lack of Transportation (Non-Medical): No  Physical Activity: Sufficiently Active (07/14/2022)   Exercise Vital Sign    Days of Exercise per Week: 3 days    Minutes of Exercise per Session: 70 min  Stress: No Stress Concern Present (07/14/2022)   [place]    Feeling of Stress : Not at all  Social Connections: Moderately Integrated (07/14/2022)   Social Connection and Isolation Panel [NHANES]    Frequency of Communication with Friends and Family: More than three times a week    Frequency of Social Gatherings with Friends and Family: More than three times a week    Attends Religious Services: More than 4 times per year    Active Member of [employer] or Organizations: Yes    Attends Music therapist: More than 4 times per year    Marital Status: Never married   Allergies  Allergen Reactions   Flexeril [Cyclobenzaprine]     Hallucinations    Penicillins Hives    Has patient had a PCN reaction causing immediate rash, facial/tongue/throat swelling, SOB or lightheadedness with hypotension: Yes Has patient had a PCN reaction causing severe rash involving mucus membranes or skin necrosis: No Has patient had a PCN reaction that required hospitalization: No Has patient had a PCN reaction occurring within the last 10 years: No If all of the above answers are "NO", then may proceed with Cephalosporin use.    Family History  Problem Relation Age of Onset   Aneurysm Mother 24       deceased secondary to brain aneurysm   Liver cancer Father 47       deceased   Diabetes Other        grandmother   Colon cancer Neg Hx    Stomach cancer Neg Hx    Rectal cancer Neg Hx    Esophageal cancer Neg Hx    Breast cancer Neg Hx     Current Outpatient Medications (Endocrine & Metabolic):    predniSONE (DELTASONE) 20 MG tablet, Take 1 tablet (20 mg total) by mouth daily with breakfast.  Current Outpatient Medications (Cardiovascular):    lisinopril (ZESTRIL) 10 MG tablet, TAKE 1 TABLET(10 MG) BY MOUTH DAILY   rosuvastatin (CRESTOR) 40 MG tablet, TAKE 1 TABLET(40 MG) BY MOUTH DAILY  Current Outpatient Medications (Respiratory):    fexofenadine (ALLEGRA) 180 MG tablet, Take 180 mg  by mouth daily.   fluticasone (FLONASE) 50 MCG/ACT nasal spray, SHAKE LIQUID AND USE 2 SPRAYS IN EACH NOSTRIL DAILY   fluticasone (FLONASE) 50 MCG/ACT nasal spray, Place 2 sprays into both nostrils daily.  Current Outpatient Medications (Analgesics):    aspirin 81 MG tablet, Take 1 tablet (81 mg total) by mouth 2 (two) times daily after a meal. (Patient taking differently: Take 81 mg by mouth daily.)  Current Outpatient Medications (Hematological):    Cyanocobalamin (VITAMIN B-12) 2500 MCG SUBL, Place 2,500 mcg under the tongue  daily.  Current Outpatient Medications (Other):    Ascorbic Acid (VITAMIN C) 1000 MG tablet, Take 1,000 mg by mouth daily.   b complex vitamins capsule, Take 1 capsule by mouth daily.   Biotin 1000 MCG tablet, Take 1,000 mcg by mouth daily.   Blood Glucose Monitoring Suppl (ONETOUCH VERIO FLEX SYSTEM) w/Device KIT, TEST TWICE DAILY   Calcium Carbonate-Vitamin D 600-400 MG-UNIT tablet, Take 1 tablet by mouth daily.   Chromium 1000 MCG TABS, Take 1,000 mcg by mouth daily.   Cinnamon 500 MG capsule, Take 1,000 mg by mouth daily.   CRANBERRY PO, Take 2 tablets by mouth daily. 4200 mg   ELDERBERRY PO, Take 1 capsule by mouth daily.   estradiol (ESTRACE) 0.1 MG/GM vaginal cream, Place 1 Applicatorful vaginally every other day. In the evening   Lancets (ONETOUCH DELICA PLUS GGYIRS85I) MISC, USE ONCE DAILY   Misc Natural Products (TART CHERRY ADVANCED) CAPS, Take 2 capsules by mouth daily.   Multiple Vitamins-Minerals (MULTIVITAMIN WITH MINERALS) tablet, Take 1 tablet by mouth daily. Centrum silver   ONETOUCH VERIO test strip, USE TWICE DAILY   OVER THE COUNTER MEDICATION, Take 1 tablet by mouth daily as needed (constipation). Vital Lax   polyethylene glycol (MIRALAX / GLYCOLAX) packet, Take 17 g by mouth daily as needed for mild constipation.   tiZANidine (ZANAFLEX) 2 MG tablet, Take 1 tablet (2 mg total) by mouth at bedtime.   Turmeric 500 MG CAPS, Take 1,000 mg by mouth  daily.    Reviewed prior external information including notes and imaging from  primary care provider As well as notes that were available from care everywhere and other healthcare systems.  Past medical history, social, surgical and family history all reviewed in electronic medical record.  No pertanent information unless stated regarding to the chief complaint.   Review of Systems:  No headache, visual changes, nausea, vomiting, diarrhea, constipation, dizziness, abdominal pain, skin rash, fevers, chills, night sweats, weight loss, swollen lymph nodes, body aches, joint swelling, chest pain, shortness of breath, mood changes. POSITIVE muscle aches  Objective  Blood pressure 130/82, pulse 97, height _0  (1.6 m), weight 152 lb (68.9 kg), SpO2 99 %.   General: No apparent distress alert and oriented x3 mood and affect normal, dressed appropriately.  HEENT: Pupils equal, extraocular movements intact  Respiratory: Patient's speak in full sentences and does not appear short of breath  Cardiovascular: No lower extremity edema, non tender, no erythema  Left hip does have replacement with severe tenderness to palpation over the greater trochanteric area.  Patient is tender to palpation with FABER test.  Negative straight leg test.  Arthritic changes noted over the knees bilaterally.    Procedure: Real-time Ultrasound Guided Injection of left  greater trochanteric bursitis secondary to patient's body habitus Device: GE Logiq Q7  Ultrasound guided injection is preferred based studies that show increased duration, increased effect, greater accuracy, decreased procedural pain, increased response rate, and decreased cost with ultrasound guided versus blind injection.  Verbal informed consent obtained.  Time-out conducted.  Noted no overlying erythema, induration, or other signs of local infection.  Skin prepped in a sterile fashion.  Local anesthesia: Topical Ethyl chloride.  With sterile  technique and under real time ultrasound guidance:  Greater trochanteric area was visualized and patient's bursa was noted. A 22-gauge 3 inch needle was inserted and 4 cc of 0.5% Marcaine and 1 cc of Kenalog 40 mg/dL was injected. Pictures taken Completed without difficulty  Pain immediately resolved  suggesting accurate placement of the medication.  Advised to call if fevers/chills, erythema, induration, drainage, or persistent bleeding.  Impression: Technically successful ultrasound guided injection.   Impression and Recommendations:     The above documentation has been reviewed and is accurate and complete [name], DO

## 2022-11-06 NOTE — Assessment & Plan Note (Signed)
Chronic problem with exacerbation.  The patient has had difficulty since the hip replacement.  The patient continues to have pain in this area but hopefully this will give her some improvement for some time.  Discussed the importance of hip abductor strengthening.  Follow-up with me again in 6 to 8 weeks.  Chronic problem with worsening symptoms.

## 2022-11-19 ENCOUNTER — Telehealth: Payer: Self-pay | Admitting: Family Medicine

## 2022-11-19 NOTE — Telephone Encounter (Signed)
Patient called asking if Dr Tamala Julian could send in something for pain? She said that something had been sent in previously and she is out of it but did not know the name of it.

## 2022-11-20 ENCOUNTER — Ambulatory Visit: Payer: Medicare Other | Admitting: Family Medicine

## 2022-11-20 NOTE — Telephone Encounter (Signed)
Patient called back following up on previous message.

## 2022-11-24 ENCOUNTER — Other Ambulatory Visit: Payer: Self-pay

## 2022-11-24 DIAGNOSIS — M1612 Unilateral primary osteoarthritis, left hip: Secondary | ICD-10-CM

## 2022-11-24 DIAGNOSIS — M1611 Unilateral primary osteoarthritis, right hip: Secondary | ICD-10-CM

## 2022-11-24 DIAGNOSIS — G8929 Other chronic pain: Secondary | ICD-10-CM

## 2022-11-24 DIAGNOSIS — M170 Bilateral primary osteoarthritis of knee: Secondary | ICD-10-CM

## 2022-11-24 NOTE — Telephone Encounter (Signed)
We can refer her to pain management if she would like

## 2022-11-24 NOTE — Telephone Encounter (Signed)
Pt called, has changed her mind and WOULD like the referral to pain management.

## 2022-11-24 NOTE — Telephone Encounter (Signed)
Spoke with patient who declines referral to pain management.

## 2022-11-24 NOTE — Telephone Encounter (Signed)
Referral placed.

## 2022-12-07 ENCOUNTER — Ambulatory Visit: Payer: Self-pay

## 2022-12-07 ENCOUNTER — Ambulatory Visit (INDEPENDENT_AMBULATORY_CARE_PROVIDER_SITE_OTHER): Payer: 59 | Admitting: Family Medicine

## 2022-12-07 VITALS — BP 112/82 | HR 84 | Ht 63.0 in | Wt 149.0 lb

## 2022-12-07 DIAGNOSIS — G5603 Carpal tunnel syndrome, bilateral upper limbs: Secondary | ICD-10-CM | POA: Diagnosis not present

## 2022-12-07 DIAGNOSIS — G8929 Other chronic pain: Secondary | ICD-10-CM

## 2022-12-07 DIAGNOSIS — M79642 Pain in left hand: Secondary | ICD-10-CM | POA: Diagnosis not present

## 2022-12-07 DIAGNOSIS — M5441 Lumbago with sciatica, right side: Secondary | ICD-10-CM | POA: Diagnosis not present

## 2022-12-07 DIAGNOSIS — M79641 Pain in right hand: Secondary | ICD-10-CM

## 2022-12-07 MED ORDER — DULOXETINE HCL 20 MG PO CPEP: 20.0000 mg | ORAL_CAPSULE | Freq: Every day | ORAL | 0 refills | Status: DC

## 2022-12-07 MED ORDER — PREDNISONE 20 MG PO TABS: 20.0000 mg | ORAL_TABLET | Freq: Every day | ORAL | 0 refills | Status: DC

## 2022-12-07 NOTE — Assessment & Plan Note (Signed)
Chronic problem but has been worsening since patient did have her replacement.  We discussed different medications and patient was willing to try the Cymbalta.  Warned of potential side effects.  Encouraged her to read the side effect labels when she gets it.  20 mg daily.  Follow-up again in 2 months

## 2022-12-07 NOTE — Assessment & Plan Note (Signed)
Bilateral injections given again today.  Has tolerated the procedure well occasionally.  We discussed with patient about icing regimen and home exercises, which activities to do and which ones to avoid, increase activity slowly.  Follow-up again in 6 to 8 weeks.  Discussed continuing with the bracing.

## 2022-12-07 NOTE — Patient Instructions (Addendum)
Prednisone '20mg'$  for 10 days Cymbalta '20mg'$   Injected both wrists See me again in 3 months

## 2022-12-07 NOTE — Progress Notes (Signed)
[facility] [place] Phone: [phone] Subjective:   [name], am serving as a scribe for Dr. Hulan Saas. I'm seeing this patient by the request  of:  Nafziger, [name], NP  CC: Bilateral wrist pain and allover pain  [id number]  Cathy [name] is a 77 y.o. female coming in with complaint of B wrist pain. Last seen in December for her knee and back pain. Patient states that she would like injections in both hands.  Sciatic nerve pain is getting worse. Patient would like to consider meloxicam and Sciatic Ease 600 as these worked for her son. Pain is constant.      Past Medical History:  Diagnosis Date   Chronic constipation    DJD (degenerative joint disease)    knees   Female cystocele    GERD (gastroesophageal reflux disease)    History of esophagitis    History of left inguinal hernia    History of MRSA infection    recurrent carbuncle   History of recurrent UTIs    Hyperlipidemia    Hypertension    Pre-diabetes    diet controlled   Urgency of urination    Past Surgical History:  Procedure Laterality Date   APPENDECTOMY  1980's   BREAST EXCISIONAL BIOPSY Left 1989   COLONOSCOPY  last one 06-18-2015   INGUINAL HERNIA REPAIR Left 05-10-2001   LAPAROSCOPIC LYSIS OF ADHESIONS  01/17/2019   OOPHORECTOMY Right 01/17/2019   PERINEOPLASTY  01/17/2019   SALPINGECTOMY Left 01/17/2019   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  SACROSPINUS FIXATION, AUGMENTATION WITH AXIS DERMIS;  Surgeon: Carolan Clines, MD;  Location: [facility];  Service: Urology;  Laterality: N/A;   Social History   Socioeconomic History   Marital status: Single    Spouse name: Not on file   Number of children: Not on file   Years of education: Not on file   Highest education level: Not on file  Occupational History   Not on file  Tobacco Use   Smoking status: Former    Years: 1.00    Types: Cigarettes    Quit date: 02/28/1996    Years since quitting: 26.7   Smokeless tobacco: Never  Vaping Use   Vaping Use: Never used  Substance and Sexual Activity   Alcohol use: Yes    Alcohol/week: 4.0 standard drinks of alcohol    Types: 4 Standard drinks or equivalent per week    Comment: on weekends   Drug use: No   Sexual activity: Not on file  Other Topics Concern   Not on file  Social History Narrative   Single   Former Smoker  -  quit 10 to 11 years ago (light smoker)   Alcohol use-yes     2 children    Occupation: [employer]    Social Determinants of Health   Financial Resource Strain: Low Risk  (07/14/2022)   Overall Financial Resource Strain (CARDIA)    Difficulty of  Paying Living Expenses: Not hard at all  Food Insecurity: No Food Insecurity (07/14/2022)   Hunger Vital Sign    Worried About Running Out of Food in the Last Year: Never true    Ran Out of Food in the Last Year: Never true  Transportation Needs: No Transportation Needs (07/14/2022)   PRAPARE - Hydrologist (Medical): No    Lack of Transportation (Non-Medical): No  Physical Activity: Sufficiently Active (07/14/2022)   Exercise Vital Sign    Days of Exercise per Week: 3 days    Minutes of Exercise per Session: 70 min  Stress: No Stress Concern Present (07/14/2022)   [place]    Feeling of Stress : Not at all  Social Connections:  Moderately Integrated (07/14/2022)   Social Connection and Isolation Panel [NHANES]    Frequency of Communication with Friends and Family: More than three times a week    Frequency of Social Gatherings with Friends and Family: More than three times a week    Attends Religious Services: More than 4 times per year    Active Member of [employer] or Organizations: Yes    Attends Music therapist: More than 4 times per year    Marital Status: Never married   Allergies  Allergen Reactions   Flexeril [Cyclobenzaprine]     Hallucinations    Penicillins Hives    Has patient had a PCN reaction causing immediate rash, facial/tongue/throat swelling, SOB or lightheadedness with hypotension: Yes Has patient had a PCN reaction causing severe rash involving mucus membranes or skin necrosis: No Has patient had a PCN reaction that required hospitalization: No Has patient had a PCN reaction occurring within the last 10 years: No If all of the above answers are "NO", then may proceed with Cephalosporin use.    Family History  Problem Relation Age of Onset   Aneurysm Mother 47       deceased secondary to brain aneurysm   Liver cancer Father 62       deceased   Diabetes Other        grandmother   Colon cancer Neg Hx    Stomach cancer Neg Hx    Rectal cancer Neg Hx    Esophageal cancer Neg Hx    Breast cancer Neg Hx     Current Outpatient Medications (Endocrine & Metabolic):    predniSONE (DELTASONE) 20 MG tablet, Take 1 tablet (20 mg total) by mouth daily with breakfast.  Current Outpatient Medications (Cardiovascular):    lisinopril (ZESTRIL) 10 MG tablet, TAKE 1 TABLET(10 MG) BY MOUTH DAILY   rosuvastatin (CRESTOR) 40 MG tablet, TAKE 1 TABLET(40 MG) BY MOUTH DAILY  Current Outpatient Medications (Respiratory):    fexofenadine (ALLEGRA) 180 MG tablet, Take 180 mg by mouth daily.   fluticasone (FLONASE) 50 MCG/ACT nasal spray, SHAKE LIQUID AND USE 2 SPRAYS IN EACH NOSTRIL DAILY    fluticasone (FLONASE) 50 MCG/ACT nasal spray, Place 2 sprays into both nostrils daily.  Current Outpatient Medications (Analgesics):    aspirin 81 MG tablet, Take 1 tablet (81 mg total) by mouth 2 (two) times daily after a meal. (Patient taking differently: Take 81 mg by mouth daily.)  Current Outpatient Medications (Hematological):    Cyanocobalamin (VITAMIN B-12) 2500 MCG SUBL, Place 2,500 mcg under the tongue daily.  Current Outpatient Medications (Other):    Ascorbic Acid (VITAMIN C) 1000 MG tablet, Take 1,000 mg by mouth daily.  b complex vitamins capsule, Take 1 capsule by mouth daily.   Biotin 1000 MCG tablet, Take 1,000 mcg by mouth daily.   Blood Glucose Monitoring Suppl (ONETOUCH VERIO FLEX SYSTEM) w/Device KIT, TEST TWICE DAILY   Calcium Carbonate-Vitamin D 600-400 MG-UNIT tablet, Take 1 tablet by mouth daily.   Chromium 1000 MCG TABS, Take 1,000 mcg by mouth daily.   Cinnamon 500 MG capsule, Take 1,000 mg by mouth daily.   CRANBERRY PO, Take 2 tablets by mouth daily. 4200 mg   DULoxetine (CYMBALTA) 20 MG capsule, Take 1 capsule (20 mg total) by mouth daily.   ELDERBERRY PO, Take 1 capsule by mouth daily.   estradiol (ESTRACE) 0.1 MG/GM vaginal cream, Place 1 Applicatorful vaginally every other day. In the evening   Lancets (ONETOUCH DELICA PLUS [id number]) MISC, USE ONCE DAILY   Misc Natural Products (TART CHERRY ADVANCED) CAPS, Take 2 capsules by mouth daily.   Multiple Vitamins-Minerals (MULTIVITAMIN WITH MINERALS) tablet, Take 1 tablet by mouth daily. Centrum silver   ONETOUCH VERIO test strip, USE TWICE DAILY   OVER THE COUNTER MEDICATION, Take 1 tablet by mouth daily as needed (constipation). Vital Lax   polyethylene glycol (MIRALAX / GLYCOLAX) packet, Take 17 g by mouth daily as needed for mild constipation.   tiZANidine (ZANAFLEX) 2 MG tablet, Take 1 tablet (2 mg total) by mouth at bedtime.   Turmeric 500 MG CAPS, Take 1,000 mg by mouth daily.    Reviewed prior  external information including notes and imaging from  primary care provider As well as notes that were available from care everywhere and other healthcare systems.  Past medical history, social, surgical and family history all reviewed in electronic medical record.  No pertanent information unless stated regarding to the chief complaint.   Review of Systems:  No headache, visual changes, nausea, vomiting, diarrhea, constipation, dizziness, abdominal pain, skin rash, fevers, chills, night sweats, weight loss, swollen lymph nodes, , joint swelling, chest pain, shortness of breath, mood changes. POSITIVE muscle aches, body aches  Objective  Blood pressure 112/82, pulse 84, height '5\' 3"'$  (1.6 m), weight 149 lb (67.6 kg), SpO2 98 %.   General: No apparent distress alert and oriented x3 mood and affect normal, dressed appropriately.  HEENT: Pupils equal, extraocular movements intact  Respiratory: Patient's speak in full sentences and does not appear short of breath  Cardiovascular: No lower extremity edema, non tender, no erythema  Bilateral wrist to have positive Tinel sign noted.  Some mild thenar eminence wasting noted.  Still some antalgic gait noted.  Tender to palpation in the paraspinal musculature of the lumbar spine.  Positive straight leg test on the right side.  Procedure: Real-time Ultrasound Guided Injection of right carpal tunnel Device: GE Logiq Q7  Ultrasound guided injection is preferred based studies that show increased duration, increased effect, greater accuracy, decreased procedural pain, increased response rate with ultrasound guided versus blind injection.  Verbal informed consent obtained.  Time-out conducted.  Noted no overlying erythema, induration, or other signs of local infection.  Skin prepped in a sterile fashion.  Local anesthesia: Topical Ethyl chloride.  With sterile technique and under real time ultrasound guidance:  median nerve visualized.  23g 5/8 inch needle  inserted distal to proximal approach into nerve sheath. Pictures taken nfor needle placement. Patient did have injection of 0.5 cc of 0.5% Marcaine, and 1 cc of Kenalog 40 mg/dL. Completed without difficulty  Pain immediately resolved suggesting accurate placement of the medication.  Advised to call  if fevers/chills, erythema, induration, drainage, or persistent bleeding.  Impression: Technically successful ultrasound guided injection.  Procedure: Real-time Ultrasound Guided Injection of left carpal tunnel Device: GE Logiq Q7 Ultrasound guided injection is preferred based studies that show increased duration, increased effect, greater accuracy, decreased procedural pain, increased response rate with ultrasound guided versus blind injection.  Verbal informed consent obtained.  Time-out conducted.  Noted no overlying erythema, induration, or other signs of local infection.  Skin prepped in a sterile fashion.  Local anesthesia: Topical Ethyl chloride.  With sterile technique and under real time ultrasound guidance:  median nerve visualized.  23g 5/8 inch needle inserted distal to proximal approach into nerve sheath. Pictures taken nfor needle placement. Patient did have injection of 0.5 cc of 0.5% Marcaine, and 1 cc of Kenalog 40 mg/dL. Completed without difficulty  Pain immediately resolved suggesting accurate placement of the medication.  Advised to call if fevers/chills, erythema, induration, drainage, or persistent bleeding.  Impression: Technically successful ultrasound guided injection.    Impression and Recommendations:    The above documentation has been reviewed and is accurate and complete [name], DO

## 2022-12-08 ENCOUNTER — Ambulatory Visit
Admission: RE | Admit: 2022-12-08 | Discharge: 2022-12-08 | Disposition: A | Discharge status: Home / Self Care | Payer: Medicaid Other | Source: Ambulatory Visit | Attending: Family Medicine | Admitting: Family Medicine

## 2022-12-08 DIAGNOSIS — M5416 Radiculopathy, lumbar region: Secondary | ICD-10-CM | POA: Diagnosis not present

## 2022-12-08 DIAGNOSIS — M47817 Spondylosis without myelopathy or radiculopathy, lumbosacral region: Secondary | ICD-10-CM | POA: Diagnosis not present

## 2022-12-08 MED ORDER — METHYLPREDNISOLONE ACETATE 40 MG/ML INJ SUSP (RADIOLOG
80.0000 mg | Freq: Once | INTRAMUSCULAR | Status: AC
  Administered 2022-12-08: 80 mg via EPIDURAL

## 2022-12-08 MED ORDER — IOPAMIDOL (ISOVUE-M 200) INJECTION 41%
1.0000 mL | Freq: Once | INTRAMUSCULAR | Status: AC
  Administered 2022-12-08: 1 mL via EPIDURAL

## 2022-12-08 NOTE — Discharge Instructions (Signed)

## 2022-12-15 ENCOUNTER — Encounter: Payer: Self-pay | Admitting: Adult Health

## 2022-12-15 ENCOUNTER — Telehealth (INDEPENDENT_AMBULATORY_CARE_PROVIDER_SITE_OTHER): Payer: 59 | Admitting: Adult Health

## 2022-12-15 DIAGNOSIS — N3281 Overactive bladder: Secondary | ICD-10-CM | POA: Diagnosis not present

## 2022-12-15 MED ORDER — TOLTERODINE TARTRATE ER 2 MG PO CP24: 2.0000 mg | ORAL_CAPSULE | Freq: Every day | ORAL | 0 refills | Status: DC

## 2022-12-15 NOTE — Progress Notes (Signed)
Virtual Visit via Video Note  I connected with Cathy [name] on 12/15/22 at  5:15 PM EST by a video enabled telemedicine application and verified that I am speaking with the correct person using two identifiers.  Location patient: home Location provider:work or home office Persons participating in the virtual visit: patient, provider  I discussed the limitations of evaluation and management by telemedicine and the availability of in person appointments. The patient expressed understanding and agreed to proceed.   HPI: 77 year old female who  has a past medical history of Chronic constipation, DJD (degenerative joint disease), Female cystocele, GERD (gastroesophageal reflux disease), History of esophagitis, History of left inguinal hernia, History of MRSA infection, History of recurrent UTIs, Hyperlipidemia, Hypertension, Pre-diabetes, and Urgency of urination.  She is being evaluated today for chronic issue of overactive bladder.  This has been a longstanding issue for over a year.  She has worked on cutting back on caffeine, decreasing the amount of water she drinks before bedtime, and decreasing chocolate.  She has kept bladder diary.  She has not noticed any improvement with these conservative modalities.  She reports she still getting up 4-5 times a night to urinate.  Denies symptoms of UTI.  Would like to try a medication to help with this.  In the past we have given her samples of Myrbetriq but there is note in her chart that this possibly could does cause bloating, she does not remember if this was true or not.   ROS: See pertinent positives and negatives per HPI.  Past Medical History:  Diagnosis Date   Chronic constipation    DJD (degenerative joint disease)    knees   Female cystocele    GERD (gastroesophageal reflux disease)    History of esophagitis    History of left inguinal hernia    History of MRSA infection    recurrent carbuncle   History of recurrent UTIs     Hyperlipidemia    Hypertension    Pre-diabetes    diet controlled   Urgency of urination     Past Surgical History:  Procedure Laterality Date   APPENDECTOMY  1980's   BREAST EXCISIONAL BIOPSY Left 1989   COLONOSCOPY  last one 06-18-2015   INGUINAL HERNIA REPAIR Left 05-10-2001   LAPAROSCOPIC LYSIS OF ADHESIONS  01/17/2019   OOPHORECTOMY Right 01/17/2019   PERINEOPLASTY  01/17/2019   SALPINGECTOMY Left 01/17/2019   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 SACROSPINUS FIXATION, AUGMENTATION WITH AXIS DERMIS;  Surgeon: Carolan Clines, MD;  Location: [facility];  Service: Urology;  Laterality: N/A;    Family History  Problem Relation Age of Onset   Aneurysm Mother 57       deceased secondary to brain aneurysm   Liver cancer Father 47       deceased   Diabetes Other        grandmother   Colon cancer Neg Hx    Stomach cancer Neg Hx    Rectal cancer Neg Hx    Esophageal cancer Neg  Hx    Breast cancer Neg Hx        Current Outpatient Medications:    Ascorbic Acid (VITAMIN C) 1000 MG tablet, Take 1,000 mg by mouth daily., Disp: , Rfl:    aspirin 81 MG tablet, Take 1 tablet (81 mg total) by mouth 2 (two) times daily after a meal. (Patient taking differently: Take 81 mg by mouth daily.), Disp: 60 tablet, Rfl: 0   b complex vitamins capsule, Take 1 capsule by mouth daily., Disp: , Rfl:    Biotin 1000 MCG tablet, Take 1,000 mcg by mouth daily., Disp: , Rfl:    Blood Glucose Monitoring Suppl  (ONETOUCH VERIO FLEX SYSTEM) w/Device KIT, TEST TWICE DAILY, Disp: 1 kit, Rfl: 0   Calcium Carbonate-Vitamin D 600-400 MG-UNIT tablet, Take 1 tablet by mouth daily., Disp: , Rfl:    Chromium 1000 MCG TABS, Take 1,000 mcg by mouth daily., Disp: , Rfl:    Cinnamon 500 MG capsule, Take 1,000 mg by mouth daily., Disp: , Rfl:    CRANBERRY PO, Take 2 tablets by mouth daily. 4200 mg, Disp: , Rfl:    Cyanocobalamin (VITAMIN B-12) 2500 MCG SUBL, Place 2,500 mcg under the tongue daily., Disp: , Rfl:    DULoxetine (CYMBALTA) 20 MG capsule, Take 1 capsule (20 mg total) by mouth daily., Disp: 30 capsule, Rfl: 0   ELDERBERRY PO, Take 1 capsule by mouth daily., Disp: , Rfl:    estradiol (ESTRACE) 0.1 MG/GM vaginal cream, Place 1 Applicatorful vaginally every other day. In the evening, Disp: , Rfl: 3   fexofenadine (ALLEGRA) 180 MG tablet, Take 180 mg by mouth daily., Disp: , Rfl:    fluticasone (FLONASE) 50 MCG/ACT nasal spray, SHAKE LIQUID AND USE 2 SPRAYS IN EACH NOSTRIL DAILY, Disp: 48 g, Rfl: 2   fluticasone (FLONASE) 50 MCG/ACT nasal spray, Place 2 sprays into both nostrils daily., Disp: 16 g, Rfl: 6   Lancets (ONETOUCH DELICA PLUS [id number]) MISC, USE ONCE DAILY, Disp: 100 each, Rfl: 3   lisinopril (ZESTRIL) 10 MG tablet, TAKE 1 TABLET(10 MG) BY MOUTH DAILY, Disp: 90 tablet, Rfl: 3   Misc Natural Products (TART CHERRY ADVANCED) CAPS, Take 2 capsules by mouth daily., Disp: , Rfl:    Multiple Vitamins-Minerals (MULTIVITAMIN WITH MINERALS) tablet, Take 1 tablet by mouth daily. Centrum silver, Disp: , Rfl:    ONETOUCH VERIO test strip, USE TWICE DAILY, Disp: 200 strip, Rfl: 3   OVER THE COUNTER MEDICATION, Take 1 tablet by mouth daily as needed (constipation). Vital Lax, Disp: , Rfl:    polyethylene glycol (MIRALAX / GLYCOLAX) packet, Take 17 g by mouth daily as needed for mild constipation., Disp: , Rfl:    rosuvastatin (CRESTOR) 40 MG tablet, TAKE 1 TABLET(40 MG) BY MOUTH DAILY, Disp: 90 tablet, Rfl: 0    tiZANidine (ZANAFLEX) 2 MG tablet, Take 1 tablet (2 mg total) by mouth at bedtime., Disp: 30 tablet, Rfl: 0   Turmeric 500 MG CAPS, Take 1,000 mg by mouth daily. , Disp: , Rfl:   EXAM:  VITALS per patient if applicable:  GENERAL: alert, oriented, appears well and in no acute distress  HEENT: atraumatic, conjunttiva clear, no obvious abnormalities on inspection of external nose and ears  NECK: normal movements of the head and neck  LUNGS: on inspection no signs of respiratory distress, breathing rate appears normal, no obvious gross SOB, gasping or wheezing  CV: no obvious cyanosis  MS: moves all visible extremities without noticeable abnormality  PSYCH/NEURO: pleasant and  cooperative, no obvious depression or anxiety, speech and thought processing grossly intact  ASSESSMENT AND PLAN:  Discussed the following assessment and plan:  1. OAB (overactive bladder) - Will trial her Detrol LA. She was advised of side effects. She will follow up with me if medication does not help or she develops any side effects - tolterodine (DETROL LA) 2 MG 24 hr capsule; Take 1 capsule (2 mg total) by mouth daily.  Dispense: 90 capsule; Refill: 0      I discussed the assessment and treatment plan with the patient. The patient was provided an opportunity to ask questions and all were answered. The patient agreed with the plan and demonstrated an understanding of the instructions.   The patient was advised to call back or seek an in-person evaluation if the symptoms worsen or if the condition fails to improve as anticipated.   [name], NP

## 2022-12-18 ENCOUNTER — Ambulatory Visit: Payer: 59 | Admitting: Adult Health

## 2022-12-18 DIAGNOSIS — N342 Other urethritis: Secondary | ICD-10-CM | POA: Diagnosis not present

## 2022-12-22 ENCOUNTER — Other Ambulatory Visit: Payer: Self-pay | Admitting: Adult Health

## 2022-12-22 ENCOUNTER — Ambulatory Visit: Payer: 59 | Admitting: Adult Health

## 2022-12-22 ENCOUNTER — Telehealth: Payer: Self-pay | Admitting: Family Medicine

## 2022-12-23 NOTE — Telephone Encounter (Signed)
Patient called to follow up on the denial of this prescription.  She said that he told her at her last visit that she would be able to refill.

## 2022-12-24 NOTE — Telephone Encounter (Signed)
Called patient to let her know that Dr. Tamala Julian intended for her to take prednisone for only 10 days. Per a verbal from Dr. Tamala Julian patient is to use Tylenol for pain relief otherwise and that we can refer her to pain management if she needs help with pain control. Patient said Tylenol does not work and hung up on me prior to me suggesting pain management.

## 2022-12-29 DIAGNOSIS — M5031 Other cervical disc degeneration,  high cervical region: Secondary | ICD-10-CM | POA: Diagnosis not present

## 2022-12-29 DIAGNOSIS — M9901 Segmental and somatic dysfunction of cervical region: Secondary | ICD-10-CM | POA: Diagnosis not present

## 2022-12-31 ENCOUNTER — Telehealth: Payer: Self-pay | Admitting: Adult Health

## 2022-12-31 DIAGNOSIS — M5031 Other cervical disc degeneration,  high cervical region: Secondary | ICD-10-CM | POA: Diagnosis not present

## 2022-12-31 DIAGNOSIS — M9901 Segmental and somatic dysfunction of cervical region: Secondary | ICD-10-CM | POA: Diagnosis not present

## 2022-12-31 NOTE — Telephone Encounter (Signed)
Patient states she is having left hip and thigh pain, requesting a prescription for prednisone. Suggested an OV, pt declined

## 2022-12-31 NOTE — Telephone Encounter (Signed)
Pt has been schedule for ov

## 2023-01-01 ENCOUNTER — Encounter: Payer: Self-pay | Admitting: Adult Health

## 2023-01-01 ENCOUNTER — Ambulatory Visit (INDEPENDENT_AMBULATORY_CARE_PROVIDER_SITE_OTHER): Payer: 59 | Admitting: Adult Health

## 2023-01-01 VITALS — BP 120/70 | HR 90 | Temp 97.6°F | Ht 63.0 in | Wt 149.0 lb

## 2023-01-01 DIAGNOSIS — M25552 Pain in left hip: Secondary | ICD-10-CM | POA: Diagnosis not present

## 2023-01-01 DIAGNOSIS — N3281 Overactive bladder: Secondary | ICD-10-CM | POA: Diagnosis not present

## 2023-01-01 DIAGNOSIS — M1612 Unilateral primary osteoarthritis, left hip: Secondary | ICD-10-CM

## 2023-01-01 MED ORDER — PREDNISONE 10 MG PO TABS: 10.0000 mg | ORAL_TABLET | Freq: Every day | ORAL | 0 refills | Status: DC

## 2023-01-01 NOTE — Progress Notes (Signed)
Subjective:    Patient ID: Cathy [name], female    DOB: [birth year], 77 y.o.   MRN: [id number]  Hip Pain    77 year old female who  has a past medical history of Chronic constipation, DJD (degenerative joint disease), Female cystocele, GERD (gastroesophageal reflux disease), History of esophagitis, History of left inguinal hernia, History of MRSA infection, History of recurrent UTIs, Hyperlipidemia, Hypertension, Pre-diabetes, and Urgency of urination.  She is being evaluated today for left sided hip and thigh pain  that has been on an going issue. Pain radiates from upper hip into thigh. Pain worse in the morning but never really goes away.. She was seen by Dr. Tamala [facility] at [facility] about a month ago and was prescribed a 10 day course of prednisone. He recommended pain management for further treatment. She reports that the pain went away when she was on the prednisone but came back as soon as she came off it. She does have a history of left hip replacement in 01/2021  Additionally, she reports that since starting Detrol LA a few weeks ago her OAB symptoms have improved, she reports getting up 1 time a night instead of 4 times. She reports dry mouth but no other side effects.      Review of Systems See HPI   Past Medical History:  Diagnosis Date   Chronic constipation    DJD (degenerative joint disease)    knees   Female cystocele    GERD (gastroesophageal reflux disease)    History of esophagitis    History of left inguinal hernia    History of MRSA infection    recurrent carbuncle   History of recurrent UTIs    Hyperlipidemia    Hypertension    Pre-diabetes    diet controlled   Urgency of urination     Social History   Socioeconomic History   Marital status: Single    Spouse name: Not on file   Number of children: Not on file   Years of education: Not on file   Highest education level: Not on file  Occupational History   Not on file  Tobacco Use   Smoking  status: Former    Years: 1.00    Types: Cigarettes    Quit date: 02/28/1996    Years since quitting: 26.8   Smokeless tobacco: Never  Vaping Use   Vaping Use: Never used  Substance and Sexual Activity   Alcohol use: Yes    Alcohol/week: 4.0 standard drinks of alcohol    Types: 4 Standard drinks or equivalent per week    Comment: on weekends   Drug use: No   Sexual activity: Not on file  Other Topics Concern   Not on file  Social History Narrative   Single   Former Smoker  -  quit 10 to 11 years ago (light smoker)   Alcohol use-yes     2 children    Occupation: [employer]    Social Determinants of Health   Financial Resource Strain: [place]  (07/14/2022)   Overall Financial Resource Strain (CARDIA)    Difficulty of Paying Living Expenses: Not hard at all  Food Insecurity: No Food Insecurity (07/14/2022)   Hunger Vital Sign    Worried About Running Out of Food in the Last Year: Never true    [place] in the Last Year: Never true  Transportation Needs: No Transportation Needs (07/14/2022)   PRAPARE - Transportation  Lack of Transportation (Medical): No    Lack of Transportation (Non-Medical): No  Physical Activity: Sufficiently Active (07/14/2022)   Exercise Vital Sign    Days of Exercise per Week: 3 days    Minutes of Exercise per Session: 70 min  Stress: No Stress Concern Present (07/14/2022)   [place]    Feeling of Stress : Not at all  Social Connections: Moderately Integrated (07/14/2022)   Social Connection and Isolation Panel [NHANES]    Frequency of Communication with Friends and Family: More than three times a week    Frequency of Social Gatherings with Friends and Family: More than three times a week    Attends Religious Services: More than 4 times per year    Active Member of Clubs or Organizations: Yes    Attends Archivist Meetings: More than 4 times per year     Marital Status: Never married  Intimate Partner Violence: Not At Risk (07/14/2022)   Humiliation, Afraid, Rape, and Kick questionnaire    Fear of Current or Ex-Partner: No    Emotionally Abused: No    Physically Abused: No    Sexually Abused: No    Past Surgical History:  Procedure Laterality Date   APPENDECTOMY  1980's   BREAST EXCISIONAL BIOPSY Left 1989   COLONOSCOPY  last one 06-18-2015   INGUINAL HERNIA REPAIR Left 05-10-2001   LAPAROSCOPIC LYSIS OF ADHESIONS  01/17/2019   OOPHORECTOMY Right 01/17/2019   PERINEOPLASTY  01/17/2019   SALPINGECTOMY Left 01/17/2019   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 SACROSPINUS FIXATION, AUGMENTATION WITH AXIS DERMIS;  Surgeon: Carolan Clines, MD;  Location: [facility];  Service: Urology;  Laterality: N/A;    Family History  Problem Relation Age of Onset   Aneurysm Mother 51       deceased secondary to brain aneurysm   Liver cancer Father 30       deceased   Diabetes Other        grandmother   Colon cancer Neg Hx    Stomach cancer Neg Hx    Rectal cancer Neg Hx    Esophageal cancer Neg Hx    Breast cancer Neg Hx     Allergies  Allergen Reactions   Flexeril [Cyclobenzaprine]     Hallucinations    Penicillins Hives    Has patient had a PCN reaction causing immediate rash, facial/tongue/throat swelling, SOB or lightheadedness with hypotension: Yes Has patient had a PCN reaction causing severe rash involving mucus membranes or skin necrosis:  No Has patient had a PCN reaction that required hospitalization: No Has patient had a PCN reaction occurring within the last 10 years: No If all of the above answers are "NO", then may proceed with Cephalosporin use.     Current Outpatient Medications on File Prior to Visit  Medication Sig Dispense Refill   Ascorbic Acid (VITAMIN C) 1000 MG tablet Take 1,000 mg by mouth daily.     aspirin 81 MG tablet Take 1 tablet (81  mg total) by mouth 2 (two) times daily after a meal. (Patient taking differently: Take 81 mg by mouth daily.) 60 tablet 0   b complex vitamins capsule Take 1 capsule by mouth daily.     Biotin 1000 MCG tablet Take 1,000 mcg by mouth daily.     Blood Glucose Monitoring Suppl (ONETOUCH VERIO FLEX SYSTEM) w/Device KIT TEST TWICE DAILY 1 kit 0   Calcium Carbonate-Vitamin D 600-400 MG-UNIT tablet Take 1 tablet by mouth daily.     Chromium 1000 MCG TABS Take 1,000 mcg by mouth daily.     Cinnamon 500 MG capsule Take 1,000 mg by mouth daily.     CRANBERRY PO Take 2 tablets by mouth daily. 4200 mg     Cyanocobalamin (VITAMIN B-12) 2500 MCG SUBL Place 2,500 mcg under the tongue daily.     DULoxetine (CYMBALTA) 20 MG capsule Take 1 capsule (20 mg total) by mouth daily. 30 capsule 0   ELDERBERRY PO Take 1 capsule by mouth daily.     estradiol (ESTRACE) 0.1 MG/GM vaginal cream Place 1 Applicatorful vaginally every other day. In the evening  3   fexofenadine (ALLEGRA) 180 MG tablet Take 180 mg by mouth daily.     fluticasone (FLONASE) 50 MCG/ACT nasal spray SHAKE LIQUID AND USE 2 SPRAYS IN EACH NOSTRIL DAILY 48 g 2   fluticasone (FLONASE) 50 MCG/ACT nasal spray Place 2 sprays into both nostrils daily. 16 g 6   Lancets (ONETOUCH DELICA PLUS 123XX123) MISC USE ONCE DAILY 100 each 3   lisinopril (ZESTRIL) 10 MG tablet TAKE 1 TABLET(10 MG) BY MOUTH DAILY 90 tablet 3   Misc Natural Products (TART CHERRY ADVANCED) CAPS Take 2 capsules by mouth daily.     Multiple Vitamins-Minerals  (MULTIVITAMIN WITH MINERALS) tablet Take 1 tablet by mouth daily. Centrum silver     ONETOUCH VERIO test strip USE TWICE DAILY 200 strip 3   OVER THE COUNTER MEDICATION Take 1 tablet by mouth daily as needed (constipation). Vital Lax     polyethylene glycol (MIRALAX / GLYCOLAX) packet Take 17 g by mouth daily as needed for mild constipation.     rosuvastatin (CRESTOR) 40 MG tablet TAKE 1 TABLET(40 MG) BY MOUTH DAILY 90 tablet 0   tiZANidine (ZANAFLEX) 2 MG tablet Take 1 tablet (2 mg total) by mouth at bedtime. 30 tablet 0   tolterodine (DETROL LA) 2 MG 24 hr capsule Take 1 capsule (2 mg total) by mouth daily. 90 capsule 0   Turmeric 500 MG CAPS Take 1,000 mg by mouth daily.      [DISCONTINUED] valsartan (DIOVAN) 160 MG tablet Take 1/2 tablet by mouth once daily 30 tablet 5   No current facility-administered medications on file prior to visit.    BP 120/70   Pulse 90   Temp 97.6 F (36.4 C) (Oral)   Ht 5' 3"$  (1.6 m)   Wt 149 lb (67.6 kg)   SpO2 98%   BMI 26.39 kg/m       Objective:   Physical Exam Vitals and nursing note reviewed.  Constitutional:      Appearance: Normal appearance.  Musculoskeletal:        General: Tenderness present.  Neurological:     General: No focal deficit present.     Mental Status: She is alert and oriented to person, place, and time.  Psychiatric:        Mood and Affect: Mood normal.        Behavior: Behavior normal.  Thought Content: Thought content normal.        Judgment: Judgment normal.       Assessment & Plan:  1. Left hip pain - will send in an additional week of prednisone and refer to pain management.  - predniSONE (DELTASONE) 10 MG tablet; Take 1 tablet (10 mg total) by mouth daily with breakfast.  Dispense: 7 tablet; Refill: 0 - Ambulatory referral to Pain Clinic  2. Primary localized osteoarthritis of left hip   3. OAB (overactive bladder) - Continue Detrol LA   [name], NP

## 2023-01-04 DIAGNOSIS — M9901 Segmental and somatic dysfunction of cervical region: Secondary | ICD-10-CM | POA: Diagnosis not present

## 2023-01-04 DIAGNOSIS — M5031 Other cervical disc degeneration,  high cervical region: Secondary | ICD-10-CM | POA: Diagnosis not present

## 2023-01-05 DIAGNOSIS — M9901 Segmental and somatic dysfunction of cervical region: Secondary | ICD-10-CM | POA: Diagnosis not present

## 2023-01-05 DIAGNOSIS — M5031 Other cervical disc degeneration,  high cervical region: Secondary | ICD-10-CM | POA: Diagnosis not present

## 2023-01-05 DIAGNOSIS — R338 Other retention of urine: Secondary | ICD-10-CM | POA: Diagnosis not present

## 2023-01-06 DIAGNOSIS — M5031 Other cervical disc degeneration,  high cervical region: Secondary | ICD-10-CM | POA: Diagnosis not present

## 2023-01-06 DIAGNOSIS — M9901 Segmental and somatic dysfunction of cervical region: Secondary | ICD-10-CM | POA: Diagnosis not present

## 2023-01-07 DIAGNOSIS — M5031 Other cervical disc degeneration,  high cervical region: Secondary | ICD-10-CM | POA: Diagnosis not present

## 2023-01-07 DIAGNOSIS — M9901 Segmental and somatic dysfunction of cervical region: Secondary | ICD-10-CM | POA: Diagnosis not present

## 2023-01-08 ENCOUNTER — Other Ambulatory Visit: Payer: Self-pay | Admitting: Physical Medicine and Rehabilitation

## 2023-01-08 DIAGNOSIS — M5416 Radiculopathy, lumbar region: Secondary | ICD-10-CM | POA: Diagnosis not present

## 2023-01-11 DIAGNOSIS — M9901 Segmental and somatic dysfunction of cervical region: Secondary | ICD-10-CM | POA: Diagnosis not present

## 2023-01-11 DIAGNOSIS — M5031 Other cervical disc degeneration,  high cervical region: Secondary | ICD-10-CM | POA: Diagnosis not present

## 2023-01-13 DIAGNOSIS — M9901 Segmental and somatic dysfunction of cervical region: Secondary | ICD-10-CM | POA: Diagnosis not present

## 2023-01-13 DIAGNOSIS — M5031 Other cervical disc degeneration,  high cervical region: Secondary | ICD-10-CM | POA: Diagnosis not present

## 2023-01-13 NOTE — Progress Notes (Signed)
Cathy [name] [place] Phone: [phone] Subjective:    I'm seeing this patient by the request  of:  [name], NP  CC: Bilateral knee pain  [id number]  12/07/2022 Chronic problem but has been worsening since patient did have her replacement. We discussed different medications and patient was willing to try the Cymbalta. Warned of potential side effects. Encouraged her to read the side effect labels when she gets it. 20 mg daily. Follow-up again in 2 months   Bilateral injections given again today. Has tolerated the procedure well occasionally. We discussed with patient about icing regimen and home exercises, which activities to do and which ones to avoid, increase activity slowly. Follow-up again in 6 to 8 weeks. Discussed continuing with the bracing.   Update 01/15/2023 Cathy [name] is a 77 y.o. female coming in with complaint of bilateral knee pain wants the knees looked at to see if fluid needs to be taken off. The knees started bothering her two weeks ago but knew she was coming in here today.  Feels the left knee is significantly more tender than the right knee.  Seems to be more on the posterior aspect.     Past Medical History:  Diagnosis Date   Chronic constipation    DJD (degenerative joint disease)    knees   Female cystocele    GERD (gastroesophageal reflux disease)    History of esophagitis    History of left inguinal hernia    History of MRSA infection    recurrent carbuncle   History of recurrent UTIs    Hyperlipidemia    Hypertension    Pre-diabetes    diet controlled   Urgency of urination    Past Surgical History:  Procedure Laterality Date   APPENDECTOMY  1980's   BREAST EXCISIONAL BIOPSY Left 1989   COLONOSCOPY  last one 06-18-2015   INGUINAL HERNIA REPAIR Left 05-10-2001   LAPAROSCOPIC LYSIS OF ADHESIONS  01/17/2019   OOPHORECTOMY Right 01/17/2019   PERINEOPLASTY  01/17/2019    SALPINGECTOMY Left 01/17/2019   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 SACROSPINUS FIXATION, AUGMENTATION WITH AXIS DERMIS;  Surgeon: Carolan Clines, MD;  Location: [facility];  Service: Urology;  Laterality: N/A;   Social History   Socioeconomic History   Marital status: Single    Spouse name: Not on file   Number of children: Not on file   Years of education: Not on file   Highest education level: Not on file  Occupational History   Not on file  Tobacco Use   Smoking status: Former    Years: 1.00    Types: Cigarettes    Quit date: 02/28/1996    Years since quitting: 26.8   Smokeless tobacco: Never  Vaping Use   Vaping Use: Never used  Substance and Sexual Activity   Alcohol use: Yes    Alcohol/week: 4.0 standard drinks of alcohol    Types: 4 Standard drinks or equivalent per week    Comment:  on weekends   Drug use: No   Sexual activity: Not on file  Other Topics Concern   Not on file  Social History Narrative   Single   Former Smoker  -  quit 10 to 11 years ago (light smoker)   Alcohol use-yes     2 children    Occupation: [employer]    Social Determinants of Health   Financial Resource Strain: Low Risk  (07/14/2022)   Overall Financial Resource Strain (CARDIA)    Difficulty of Paying Living Expenses: Not hard at all  Food Insecurity: No Food Insecurity (07/14/2022)   Hunger Vital Sign    Worried About Running Out of Food in the Last Year: Never true    [place] in the Last Year: Never true  Transportation Needs: No Transportation Needs (07/14/2022)   PRAPARE - Hydrologist (Medical): No    Lack of Transportation (Non-Medical): No  Physical Activity: Sufficiently Active (07/14/2022)   Exercise Vital Sign    Days of Exercise per Week: 3 days    Minutes of Exercise per Session: 70 min  Stress: No Stress Concern Present (07/14/2022)   [place]    Feeling of Stress : Not at all  Social Connections: Moderately Integrated (07/14/2022)   Social Connection and Isolation Panel [NHANES]    Frequency of Communication with Friends and Family: More than three times a week    Frequency of Social Gatherings with Friends and Family: More than three times a week    Attends Religious Services: More than 4 times per year    Active Member of [employer] or Organizations: Yes    Attends Music therapist: More than 4 times per year    Marital Status: Never married   Allergies  Allergen Reactions   Flexeril [Cyclobenzaprine]     Hallucinations    Penicillins Hives    Has patient had a PCN reaction causing immediate rash, facial/tongue/throat swelling, SOB or lightheadedness with hypotension: Yes Has patient had a PCN reaction causing severe rash involving mucus membranes or skin necrosis: No Has patient had a PCN reaction that required hospitalization: No Has patient had a PCN reaction occurring within the last 10 years: No If all of the above answers are "NO", then may proceed with Cephalosporin use.    Family History  Problem Relation Age of Onset   Aneurysm Mother 48       deceased secondary to brain aneurysm   Liver cancer Father 31       deceased   Diabetes Other        grandmother   Colon cancer Neg Hx    Stomach cancer Neg Hx    Rectal cancer Neg Hx    Esophageal cancer Neg Hx    Breast cancer Neg Hx     Current Outpatient Medications  (Endocrine & Metabolic):    predniSONE (DELTASONE) 10 MG tablet, Take 1 tablet (10 mg total) by mouth daily with breakfast.  Current Outpatient Medications (Cardiovascular):    lisinopril (ZESTRIL) 10 MG tablet, TAKE 1 TABLET(10 MG) BY MOUTH DAILY   rosuvastatin (CRESTOR) 40 MG tablet, TAKE 1 TABLET(40 MG) BY MOUTH DAILY  Current Outpatient Medications (Respiratory):    fexofenadine (ALLEGRA) 180 MG tablet, Take 180 mg by mouth daily.   fluticasone (FLONASE) 50 MCG/ACT nasal spray, SHAKE LIQUID AND USE 2 SPRAYS IN EACH NOSTRIL DAILY   fluticasone (  FLONASE) 50 MCG/ACT nasal spray, Place 2 sprays into both nostrils daily.  Current Outpatient Medications (Analgesics):    aspirin 81 MG tablet, Take 1 tablet (81 mg total) by mouth 2 (two) times daily after a meal. (Patient taking differently: Take 81 mg by mouth daily.)  Current Outpatient Medications (Hematological):    Cyanocobalamin (VITAMIN B-12) 2500 MCG SUBL, Place 2,500 mcg under the tongue daily.  Current Outpatient Medications (Other):    Ascorbic Acid (VITAMIN C) 1000 MG tablet, Take 1,000 mg by mouth daily.   b complex vitamins capsule, Take 1 capsule by mouth daily.   Biotin 1000 MCG tablet, Take 1,000 mcg by mouth daily.   Blood Glucose Monitoring Suppl (ONETOUCH VERIO FLEX SYSTEM) w/Device KIT, TEST TWICE DAILY   Calcium Carbonate-Vitamin D 600-400 MG-UNIT tablet, Take 1 tablet by mouth daily.   Chromium 1000 MCG TABS, Take 1,000 mcg by mouth daily.   Cinnamon 500 MG capsule, Take 1,000 mg by mouth daily.   CRANBERRY PO, Take 2 tablets by mouth daily. 4200 mg   DULoxetine (CYMBALTA) 20 MG capsule, Take 1 capsule (20 mg total) by mouth daily.   ELDERBERRY PO, Take 1 capsule by mouth daily.   estradiol (ESTRACE) 0.1 MG/GM vaginal cream, Place 1 Applicatorful vaginally every other day. In the evening   Lancets (ONETOUCH DELICA PLUS 123XX123) MISC, USE ONCE DAILY   Misc Natural Products (TART CHERRY ADVANCED) CAPS, Take 2  capsules by mouth daily.   Multiple Vitamins-Minerals (MULTIVITAMIN WITH MINERALS) tablet, Take 1 tablet by mouth daily. Centrum silver   ONETOUCH VERIO test strip, USE TWICE DAILY   OVER THE COUNTER MEDICATION, Take 1 tablet by mouth daily as needed (constipation). Vital Lax   polyethylene glycol (MIRALAX / GLYCOLAX) packet, Take 17 g by mouth daily as needed for mild constipation.   tiZANidine (ZANAFLEX) 2 MG tablet, Take 1 tablet (2 mg total) by mouth at bedtime.   tolterodine (DETROL LA) 2 MG 24 hr capsule, Take 1 capsule (2 mg total) by mouth daily.   Turmeric 500 MG CAPS, Take 1,000 mg by mouth daily.    Reviewed prior external information including notes and imaging from  primary care provider As well as notes that were available from care everywhere and other healthcare systems.  Past medical history, social, surgical and family history all reviewed in electronic medical record.  No pertanent information unless stated regarding to the chief complaint.   Review of Systems:  No headache, visual changes, nausea, vomiting, diarrhea, constipation, dizziness, abdominal pain, skin rash, fevers, chills, night sweats, weight loss, swollen lymph nodes, body aches, joint swelling, chest pain, shortness of breath, mood changes. POSITIVE muscle aches  Objective  Blood pressure 130/80, pulse 99, height '5\' 3"'$  (1.6 m), weight 149 lb (67.6 kg), SpO2 99 %.   General: No apparent distress alert and oriented x3 mood and affect normal, dressed appropriately.  HEENT: Pupils equal, extraocular movements intact  Respiratory: Patient's speak in full sentences and does not appear short of breath  Cardiovascular: No lower extremity edema, non tender, no erythema  Severely antalgic gait noted.  Tender to palpation in the paraspinal musculature of the back some.  Patient's knees do have valgus deformities noted.  No significant instability of the knees bilaterally.  After informed written and verbal consent,  patient was seated on exam table. Right knee was prepped with alcohol swab and utilizing anterolateral approach, patient's right knee space was injected with 4:1  marcaine 0.5%: Kenalog '40mg'$ /dL. Patient tolerated the procedure  well without immediate complications.  Procedure: Real-time Ultrasound Guided Injection of left knee Baker's cyst Device: GE Logiq Q7 Ultrasound guided injection is preferred based studies that show increased duration, increased effect, greater accuracy, decreased procedural pain, increased response rate, and decreased cost with ultrasound guided versus blind injection.  Verbal informed consent obtained.  Time-out conducted.  Noted no overlying erythema, induration, or other signs of local infection.  Skin prepped in a sterile fashion.  Local anesthesia: Topical Ethyl chloride.  With sterile technique and under real time ultrasound guidance: With a 22-gauge 2 inch needle patient was injected with 4 cc of 0.5% Marcaine and aspirated 25 cc of straw-colored fluid then injected 1 cc of Kenalog 40 mg/dL. This was from a posterior approach.  Completed without difficulty  Pain immediately resolved suggesting accurate placement of the medication.  Advised to call if fevers/chills, erythema, induration, drainage, or persistent bleeding.  Impression: Technically successful ultrasound guided injection.    Impression and Recommendations:    The above documentation has been reviewed and is accurate and complete [name], DO

## 2023-01-15 ENCOUNTER — Ambulatory Visit (INDEPENDENT_AMBULATORY_CARE_PROVIDER_SITE_OTHER): Payer: 59 | Admitting: Family Medicine

## 2023-01-15 ENCOUNTER — Ambulatory Visit: Payer: Self-pay

## 2023-01-15 VITALS — BP 130/80 | HR 99 | Ht 63.0 in | Wt 149.0 lb

## 2023-01-15 DIAGNOSIS — M170 Bilateral primary osteoarthritis of knee: Secondary | ICD-10-CM

## 2023-01-15 NOTE — Patient Instructions (Signed)
Good to see you  Drained the knee today hopefully it will start feeling a lot better Injected both knees today  See what pain medicine says See me again in 2-3 months

## 2023-01-15 NOTE — Assessment & Plan Note (Signed)
Patient did have steroid injection of the right knee today.  Patient given a aspiration of the Baker's cyst noted of the left knee today.  Patient has improvement in range of motion.  Encourage patient to consider the possibility of surgical intervention.  Do feel that the left knee is worse somewhat secondary to her sciatica and patient is seen another provider for this.  See if new MRI of the back shows any nerve impingement that could be contributing as well.  Will follow-up with me again in 8 to 12 weeks

## 2023-01-18 DIAGNOSIS — M5031 Other cervical disc degeneration,  high cervical region: Secondary | ICD-10-CM | POA: Diagnosis not present

## 2023-01-18 DIAGNOSIS — M9901 Segmental and somatic dysfunction of cervical region: Secondary | ICD-10-CM | POA: Diagnosis not present

## 2023-01-20 DIAGNOSIS — M5031 Other cervical disc degeneration,  high cervical region: Secondary | ICD-10-CM | POA: Diagnosis not present

## 2023-01-20 DIAGNOSIS — M9901 Segmental and somatic dysfunction of cervical region: Secondary | ICD-10-CM | POA: Diagnosis not present

## 2023-01-23 ENCOUNTER — Ambulatory Visit
Admission: RE | Admit: 2023-01-23 | Discharge: 2023-01-23 | Disposition: A | Discharge status: Home / Self Care | Payer: 59 | Source: Ambulatory Visit | Attending: Physical Medicine and Rehabilitation | Admitting: Physical Medicine and Rehabilitation

## 2023-01-23 DIAGNOSIS — M545 Low back pain, unspecified: Secondary | ICD-10-CM | POA: Diagnosis not present

## 2023-01-23 DIAGNOSIS — M48061 Spinal stenosis, lumbar region without neurogenic claudication: Secondary | ICD-10-CM | POA: Diagnosis not present

## 2023-01-23 DIAGNOSIS — M5416 Radiculopathy, lumbar region: Secondary | ICD-10-CM

## 2023-01-25 DIAGNOSIS — M5416 Radiculopathy, lumbar region: Secondary | ICD-10-CM | POA: Diagnosis not present

## 2023-01-29 DIAGNOSIS — M21611 Bunion of right foot: Secondary | ICD-10-CM | POA: Diagnosis not present

## 2023-02-02 ENCOUNTER — Telehealth: Payer: Self-pay | Admitting: Family Medicine

## 2023-02-02 DIAGNOSIS — M5416 Radiculopathy, lumbar region: Secondary | ICD-10-CM | POA: Diagnosis not present

## 2023-02-02 DIAGNOSIS — N390 Urinary tract infection, site not specified: Secondary | ICD-10-CM | POA: Diagnosis not present

## 2023-02-02 DIAGNOSIS — Z87440 Personal history of urinary (tract) infections: Secondary | ICD-10-CM | POA: Diagnosis not present

## 2023-02-02 DIAGNOSIS — N3281 Overactive bladder: Secondary | ICD-10-CM | POA: Diagnosis not present

## 2023-02-02 NOTE — Telephone Encounter (Signed)
Left message to call back  

## 2023-02-02 NOTE — Telephone Encounter (Signed)
Patient called stating that she has another "spot" similar to one that Dr Tamala Julian looked at the last time she was here and was told it was fluid.[name] She wanted to know if it could be the same?  Please advise.

## 2023-02-09 NOTE — Progress Notes (Unsigned)
[place] Phone: [phone] Subjective:   Cathy Cathy [name], am serving as a scribe for Dr. Hulan [name].  I'm seeing this patient by the request  of:  Cathy Peng, NP  CC: Hand pain, knee pain  YIR:[id number]  01/15/2023 Patient did have steroid injection of the right knee today.  Patient given a aspiration of the Baker's cyst noted of the left knee today.  Patient has improvement in range of motion.  Encourage patient to consider the possibility of surgical intervention.  Do feel that the left knee is worse somewhat secondary to her sciatica and patient is seen another provider for this.  See if new MRI of the back shows any nerve impingement that could be contributing as well.  Will follow-up with me again in 8 to 12 weeks      Update 02/10/2023 Cathy Cathy [name] is a 77 y.o. female coming in with complaint of B wrist pain and L knee pain. Here today for carpal tunnel. B injections 12/07/2022. Patient states that she is having increase in L knee pain. Injection was not as helpful last visit. Notes swelling in the joint.   Injection in back by Dr. Mina Cathy [name] at [facility] last week. Cathy [name] change in her back pain.  Complaints of almost every joint hurting.  Patient states that the pains are stopping her from daily activities sometimes as well as bumping this with her family.     Past Medical History:  Diagnosis Date   Chronic constipation    DJD (degenerative joint disease)    knees   Female cystocele    GERD (gastroesophageal reflux disease)    History of esophagitis    History of left inguinal hernia    History of MRSA infection    recurrent carbuncle   History of recurrent UTIs    Hyperlipidemia    Hypertension    Pre-diabetes    diet controlled   Urgency of urination    Past Surgical History:  Procedure Laterality Date   APPENDECTOMY  1980's   BREAST EXCISIONAL BIOPSY Left 1989   COLONOSCOPY  last one  06-18-2015   INGUINAL HERNIA REPAIR Left 05-10-2001   LAPAROSCOPIC LYSIS OF ADHESIONS  01/17/2019   OOPHORECTOMY Right 01/17/2019   PERINEOPLASTY  01/17/2019   SALPINGECTOMY Left 01/17/2019   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 SACROSPINUS FIXATION, AUGMENTATION WITH AXIS DERMIS;  Surgeon: Carolan Clines, MD;  Location: [facility];  Service: Urology;  Laterality: N/A;   Social History   Socioeconomic History   Marital status: Single    Spouse name: Not on file   Number of children: Not on file   Years of education: Not on file   Highest education level: Not on file  Occupational History   Not on file  Tobacco Use   Smoking status: Former    Years: 1    Types: Cigarettes    Quit date: 02/28/1996    Years since quitting: 26.9   Smokeless tobacco:  Never  Vaping Use   Vaping Use: Never used  Substance and Sexual Activity   Alcohol use: Yes    Alcohol/week: 4.0 standard drinks of alcohol    Types: 4 Standard drinks or equivalent per week    Comment: on weekends   Drug use: Cathy [name]   Sexual activity: Not on file  Other Topics Concern   Not on file  Social History Narrative   Single   Former Smoker  -  quit 10 to 11 years ago (light smoker)   Alcohol use-yes     2 children    Occupation: [employer]    Social Determinants of Health   Financial Resource Strain: [place]  (07/14/2022)   Overall Financial Resource Strain (CARDIA)    Difficulty of Paying Living Expenses: Not  hard at all  Food Insecurity: Cathy [name] Food Insecurity (07/14/2022)   Hunger Vital Sign    Worried About Running Out of Food in the Last Year: Never true    [place] in the Last Year: Never true  Transportation Needs: Cathy [name] Transportation Needs (07/14/2022)   PRAPARE - Hydrologist (Medical): Cathy [name]    Lack of Transportation (Non-Medical): Cathy [name]  Physical Activity: Sufficiently Active (07/14/2022)   Exercise Vital Sign    Days of Exercise per Week: 3 days    Minutes of Exercise per Session: 70 min  Stress: Cathy [name] Stress Concern Present (07/14/2022)   [place]    Feeling of Stress : Not at all  Social Connections: Moderately Integrated (07/14/2022)   Social Connection and Isolation Panel [NHANES]    Frequency of Communication with Friends and Family: More than three times a week    Frequency of Social Gatherings with Friends and Family: More than three times a week    Attends Religious Services: More than 4 times per year    Active Member of [employer] or Organizations: Yes    Attends Music therapist: More than 4 times per year    Marital Status: Never married   Allergies  Allergen Reactions   Flexeril [Cyclobenzaprine]     Hallucinations    Penicillins Hives    Has patient had a PCN reaction causing immediate rash, facial/tongue/throat swelling, SOB or lightheadedness with hypotension: Yes Has patient had a PCN reaction causing severe rash involving mucus membranes or skin necrosis: Cathy [name] Has patient had a PCN reaction that required hospitalization: Cathy [name] Has patient had a PCN reaction occurring within the last 10 years: Cathy [name] If all of the above answers are "Cathy [name]", then may proceed with Cephalosporin use.    Family History  Problem Relation Age of Onset   Aneurysm Mother 55       deceased secondary to brain aneurysm   Liver cancer Father 89       deceased   Diabetes Other        grandmother    Colon cancer Neg Hx    Stomach cancer Neg Hx    Rectal cancer Neg Hx    Esophageal cancer Neg Hx    Breast cancer Neg Hx     Current Outpatient Medications (Endocrine & Metabolic):    predniSONE (DELTASONE) 10 MG tablet, Take 1 tablet (10 mg total) by mouth daily with breakfast.  Current Outpatient Medications (Cardiovascular):    lisinopril (ZESTRIL) 10 MG tablet, TAKE 1 TABLET(10 MG) BY MOUTH DAILY   rosuvastatin (CRESTOR) 40 MG tablet, TAKE 1 TABLET(40  MG) BY MOUTH DAILY  Current Outpatient Medications (Respiratory):    fexofenadine (ALLEGRA) 180 MG tablet, Take 180 mg by mouth daily.   fluticasone (FLONASE) 50 MCG/ACT nasal spray, SHAKE LIQUID AND USE 2 SPRAYS IN EACH NOSTRIL DAILY   fluticasone (FLONASE) 50 MCG/ACT nasal spray, Place 2 sprays into both nostrils daily.  Current Outpatient Medications (Analgesics):    aspirin 81 MG tablet, Take 1 tablet (81 mg total) by mouth 2 (two) times daily after a meal. (Patient taking differently: Take 81 mg by mouth daily.)  Current Outpatient Medications (Hematological):    Cyanocobalamin (VITAMIN B-12) 2500 MCG SUBL, Place 2,500 mcg under the tongue daily.  Current Outpatient Medications (Other):    Ascorbic Acid (VITAMIN C) 1000 MG tablet, Take 1,000 mg by mouth daily.   b complex vitamins capsule, Take 1 capsule by mouth daily.   Biotin 1000 MCG tablet, Take 1,000 mcg by mouth daily.   Blood Glucose Monitoring Suppl (ONETOUCH VERIO FLEX SYSTEM) w/Device KIT, TEST TWICE DAILY   Calcium Carbonate-Vitamin D 600-400 MG-UNIT tablet, Take 1 tablet by mouth daily.   Chromium 1000 MCG TABS, Take 1,000 mcg by mouth daily.   Cinnamon 500 MG capsule, Take 1,000 mg by mouth daily.   CRANBERRY PO, Take 2 tablets by mouth daily. 4200 mg   DULoxetine (CYMBALTA) 20 MG capsule, Take 1 capsule (20 mg total) by mouth daily.   ELDERBERRY PO, Take 1 capsule by mouth daily.   estradiol (ESTRACE) 0.1 MG/GM vaginal cream, Place 1 Applicatorful vaginally  every other day. In the evening   Lancets (ONETOUCH DELICA PLUS 123XX123) MISC, USE ONCE DAILY   Misc Natural Products (TART CHERRY ADVANCED) CAPS, Take 2 capsules by mouth daily.   Multiple Vitamins-Minerals (MULTIVITAMIN WITH MINERALS) tablet, Take 1 tablet by mouth daily. Centrum silver   ONETOUCH VERIO test strip, USE TWICE DAILY   OVER THE COUNTER MEDICATION, Take 1 tablet by mouth daily as needed (constipation). Vital Lax   polyethylene glycol (MIRALAX / GLYCOLAX) packet, Take 17 g by mouth daily as needed for mild constipation.   tiZANidine (ZANAFLEX) 2 MG tablet, Take 1 tablet (2 mg total) by mouth at bedtime.   tolterodine (DETROL LA) 2 MG 24 hr capsule, Take 1 capsule (2 mg total) by mouth daily.   Turmeric 500 MG CAPS, Take 1,000 mg by mouth daily.    Reviewed prior external information including notes and imaging from  primary care provider As well as notes that were available from care everywhere and other healthcare systems.  Past medical history, social, surgical and family history all reviewed in electronic medical record.  Cathy [name] pertanent information unless stated regarding to the chief complaint.   Review of Systems:  Cathy [name] headache, visual changes, nausea, vomiting, diarrhea, constipation, dizziness, abdominal pain, skin rash, fevers, chills, night sweats, weight loss, swollen lymph nodes, body aches, joint swelling, chest pain, shortness of breath, mood changes. POSITIVE muscle aches  Objective  Blood pressure 118/82, pulse 96, height 5\' 3"  (1.6 m), weight 154 lb (69.9 kg), SpO2 98 %.   General: Cathy [name] apparent distress alert and oriented x3 mood and affect normal, dressed appropriately.  HEENT: Pupils equal, extraocular movements intact  Respiratory: Patient's speak in full sentences and does not appear short of breath  Cardiovascular: Cathy [name] lower extremity edema, non tender, Cathy [name] erythema  Antalgic gait noted.  Trace effusion noted of the left knee still.  Crepitus noted with  flexion and extension. Wrist exam does show some thenar eminence wasting noted bilaterally.  Positive Tinel's bilaterally at the median nerve  Procedure: Real-time Ultrasound Guided Injection of right carpal tunnel Device: GE Logiq Q7  Ultrasound guided injection is preferred based studies that show increased duration, increased effect, greater accuracy, decreased procedural pain, increased response rate with ultrasound guided versus blind injection.  Verbal informed consent obtained.  Time-out conducted.  Noted Cathy [name] overlying erythema, induration, or other signs of local infection.  Skin prepped in a sterile fashion.  Local anesthesia: Topical Ethyl chloride.  With sterile technique and under real time ultrasound guidance:  median nerve visualized.  23g 5/8 inch needle inserted distal to proximal approach into nerve sheath. Pictures taken nfor needle placement. Patient did have injection of 0.5 cc of 0.5% Marcaine, and 0.5 cc of Kenalog 40 mg/dL. Completed without difficulty  Pain immediately resolved suggesting accurate placement of the medication.  Advised to call if fevers/chills, erythema, induration, drainage, or persistent bleeding.  Impression: Technically successful ultrasound guided injection.  Procedure: Real-time Ultrasound Guided Injection of left carpal tunnel Device: GE Logiq Q7 Ultrasound guided injection is preferred based studies that show increased duration, increased effect, greater accuracy, decreased procedural pain, increased response rate with ultrasound guided versus blind injection.  Verbal informed consent obtained.  Time-out conducted.  Noted Cathy [name] overlying erythema, induration, or other signs of local infection.  Skin prepped in a sterile fashion.  Local anesthesia: Topical Ethyl chloride.  With sterile technique and under real time ultrasound guidance:  median nerve visualized.  23g 5/8 inch needle inserted distal to proximal approach into nerve sheath. Pictures  taken nfor needle placement. Patient did have injection of 0.5 cc of 0.5% Marcaine, and 0.5 cc of Kenalog 40 mg/dL. Completed without difficulty  Pain immediately resolved suggesting accurate placement of the medication.  Advised to call if fevers/chills, erythema, induration, drainage, or persistent bleeding.   Impression: Technically successful ultrasound guided injection.  After informed written and verbal consent, patient was seated on exam table. Left knee was prepped with alcohol swab and utilizing anterolateral approach, patient's left knee space was injected with 4:1  marcaine 0.5%: Kenalog 40mg /dL. Patient tolerated the procedure well without immediate complications.   Impression and Recommendations:    The above documentation has been reviewed and is accurate and complete [name], DO

## 2023-02-10 ENCOUNTER — Other Ambulatory Visit: Payer: Self-pay

## 2023-02-10 ENCOUNTER — Ambulatory Visit (INDEPENDENT_AMBULATORY_CARE_PROVIDER_SITE_OTHER): Payer: 59 | Admitting: Family Medicine

## 2023-02-10 VITALS — BP 118/82 | HR 96 | Ht 63.0 in | Wt 154.0 lb

## 2023-02-10 DIAGNOSIS — G5603 Carpal tunnel syndrome, bilateral upper limbs: Secondary | ICD-10-CM | POA: Diagnosis not present

## 2023-02-10 DIAGNOSIS — M1712 Unilateral primary osteoarthritis, left knee: Secondary | ICD-10-CM

## 2023-02-10 DIAGNOSIS — M170 Bilateral primary osteoarthritis of knee: Secondary | ICD-10-CM

## 2023-02-10 DIAGNOSIS — M25531 Pain in right wrist: Secondary | ICD-10-CM

## 2023-02-10 NOTE — Assessment & Plan Note (Signed)
Worsening pain on the left knee.  Given injection again.  Tolerated the procedure well.  Discussed icing regimen and home exercises.  Hopeful that this will make an improvement.  Could be a candidate for viscosupplementation but did not have any significant benefit she states from the February 2023.  That said we did not get to weeks to do another steroid injection till June of that year.  Patient is a diabetic and will watch blood sugars closely.  Patient warned about this and understands.  Follow-up with me again 2 to 3 months.  Patient did have the hip replacement and wants to avoid any type of surgical intervention again.

## 2023-02-10 NOTE — Assessment & Plan Note (Signed)
Discussed with patient this continues to give her difficulty she should consider the possibility of surgical intervention for this chronic problem with exacerbating factors.  Patient is noncompliant with some of this pacing.  Patient feels that she does not want to have any surgical intervention at this time.  Discussed different medications but patient has had side effects to also does not take them regularly.  Follow-up with me again in 2 to 3 months

## 2023-02-10 NOTE — Patient Instructions (Addendum)
Good to see you Injected both wrist and L knee  See me again in 2-3 months

## 2023-02-11 ENCOUNTER — Telehealth: Payer: Self-pay

## 2023-02-11 NOTE — Telephone Encounter (Signed)
Patient called stating she was seen recently and was talking about some pain that she has in her left leg. States the assistant told her it was her shin, but it is still bothering her and wants to know if there is anything she can do for this pain.

## 2023-02-15 NOTE — Telephone Encounter (Signed)
Patient called back. She said that she has been in a lot of pain over the weekend.  Please advise.

## 2023-02-15 NOTE — Telephone Encounter (Signed)
Left message for patient to call back to schedule with another provider.

## 2023-02-16 DIAGNOSIS — M5031 Other cervical disc degeneration,  high cervical region: Secondary | ICD-10-CM | POA: Diagnosis not present

## 2023-02-16 DIAGNOSIS — M9901 Segmental and somatic dysfunction of cervical region: Secondary | ICD-10-CM | POA: Diagnosis not present

## 2023-02-17 DIAGNOSIS — M5031 Other cervical disc degeneration,  high cervical region: Secondary | ICD-10-CM | POA: Diagnosis not present

## 2023-02-17 DIAGNOSIS — M9901 Segmental and somatic dysfunction of cervical region: Secondary | ICD-10-CM | POA: Diagnosis not present

## 2023-02-18 ENCOUNTER — Telehealth: Payer: Self-pay | Admitting: Adult Health

## 2023-02-18 NOTE — Telephone Encounter (Signed)
Pt states her nose keeps bleeding every time she blows her nose.  Pt is aware she has an appt on Thursday, but wants to speak to [place] now.  Please call Pt back at your earliest convenience.

## 2023-02-18 NOTE — Telephone Encounter (Signed)
Spoke to pt and she stated she would like some abx called in. Pt claims she has a sinus infection. Pt advised that abx cannot be filled without an appt. Due to it being the end of the day there were no appts. So I advised pt to go to UC. Pt declined and stated she is not going to UC she will come in Thursday. Pt became annoyed and stated " what good is having a doctor if I can't get mediation". I advised that she would need an appt. So that we can properly treat her. Pt disconnected the call.

## 2023-02-19 ENCOUNTER — Encounter (HOSPITAL_COMMUNITY): Payer: Self-pay

## 2023-02-19 ENCOUNTER — Ambulatory Visit (HOSPITAL_COMMUNITY)
Admission: EM | Admit: 2023-02-19 | Discharge: 2023-02-19 | Disposition: A | Discharge status: Home / Self Care | Payer: 59 | Attending: Emergency Medicine | Admitting: Emergency Medicine

## 2023-02-19 DIAGNOSIS — J0100 Acute maxillary sinusitis, unspecified: Secondary | ICD-10-CM

## 2023-02-19 DIAGNOSIS — R051 Acute cough: Secondary | ICD-10-CM

## 2023-02-19 MED ORDER — DOXYCYCLINE HYCLATE 100 MG PO CAPS: 100.0000 mg | ORAL_CAPSULE | Freq: Two times a day (BID) | ORAL | 0 refills | Status: DC

## 2023-02-19 MED ORDER — BENZONATATE 100 MG PO CAPS: 100.0000 mg | ORAL_CAPSULE | Freq: Three times a day (TID) | ORAL | 0 refills | Status: DC

## 2023-02-19 NOTE — ED Provider Notes (Signed)
[place]    CSN: [id number] Arrival date & time: 02/19/23  1255      History   Chief Complaint Chief Complaint  Patient presents with   Cough   Nasal Congestion    HPI SHALEIGH PADLEY is a 77 y.o. female.   Patient presents to clinic for complaint of productive cough with yellow-green sputum, nasal congestion, sinus pain and pressure and occasional bloody nasal drainage with sneezing for the past 5 days.  She reports she has been taking Mucinex, [facility] cold and cough, and daily cetirizine.  She does have Flonase, she has not been using it.  Denies any shortness of breath, fevers, chest pain, nausea, vomiting or diarrhea.   The history is provided by the patient.  Cough Associated symptoms: rhinorrhea and sore throat   Associated symptoms: no chest pain, no chills, no eye discharge, no fever, no headaches, no shortness of breath and no wheezing     Past Medical History:  Diagnosis Date   Chronic constipation    DJD (degenerative joint disease)    knees   Female cystocele    GERD (gastroesophageal reflux disease)    History of esophagitis    History of left inguinal hernia    History of MRSA infection    recurrent carbuncle   History of recurrent UTIs    Hyperlipidemia    Hypertension    Pre-diabetes    diet controlled   Urgency of urination     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 06/18/2021   Primary localized osteoarthritis of left hip 01/28/2021   Primary osteoarthritis of left hip 01/28/2021   Primary localized osteoarthritis of right hip 04/09/2020   Primary osteoarthritis of right hip 04/09/2020   Arthritis of right hip 02/19/2020   Greater trochanteric bursitis of both hips 12/19/2019   Greater trochanteric bursitis of left hip 10/03/2019   Arthritis of left hip 10/03/2019   Left groin pain 03/01/2019   Right shoulder  pain 12/27/2018   Chronic bilateral low back pain with right-sided sciatica 01/14/2018   Urinary frequency 01/14/2018   Degenerative arthritis of knee, bilateral 10/21/2017   Arthritis of knee, left 08/03/2017   GERD (gastroesophageal reflux disease) 03/27/2015   Preventative health care 01/04/2015   Constipation 07/15/2012   Allergic rhinitis 04/01/2012   Diabetes mellitus type 2, controlled ([place]) 10/14/2007   Carpal tunnel syndrome 10/14/2007   Dyslipidemia 10/13/2007   Essential hypertension 10/13/2007   Depression, recurrent ([place]) 10/13/2007    Past Surgical History:  Procedure Laterality Date   APPENDECTOMY  1980's   BREAST EXCISIONAL BIOPSY Left 1989   COLONOSCOPY  last one 06-18-2015   INGUINAL HERNIA REPAIR Left 05-10-2001   LAPAROSCOPIC LYSIS OF ADHESIONS  01/17/2019   OOPHORECTOMY Right 01/17/2019   PERINEOPLASTY  01/17/2019   SALPINGECTOMY Left 01/17/2019   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 SACROSPINUS FIXATION, AUGMENTATION WITH AXIS DERMIS;  Surgeon:  Carolan Clines, MD;  Location[facility];  Service: Urology;  Laterality: N/A;    OB History     Gravida  3   Para  2   Term  2   Preterm      AB  1   Living  2      SAB  1   IAB      Ectopic      Multiple      Live Births               Home Medications    Prior to Admission medications   Medication Sig Start Date End Date Taking? Authorizing Provider  benzonatate (TESSALON) 100 MG capsule  Take 1 capsule (100 mg total) by mouth every 8 (eight) hours. 02/19/23  Yes Louretta Shorten, [place] N, FNP  doxycycline (VIBRAMYCIN) 100 MG capsule Take 1 capsule (100 mg total) by mouth 2 (two) times daily. 02/19/23  Yes Louretta Shorten, [place], FNP  Ascorbic Acid (VITAMIN C) 1000 MG tablet Take 1,000 mg by mouth daily.    [provider]  aspirin 81 MG tablet Take 1 tablet (81 mg total) by mouth 2 (two) times daily after a meal. Patient taking differently: Take 81 mg by mouth daily. 04/10/20   Loni Dolly, PA-C  b complex vitamins capsule Take 1 capsule by mouth daily.    [provider]  Biotin 1000 MCG tablet Take 1,000 mcg by mouth daily.    [provider]  Blood Glucose Monitoring Suppl ([place]) w/Device KIT TEST TWICE DAILY 07/02/21   Nafziger, [name], NP  Calcium Carbonate-Vitamin D 600-400 MG-UNIT tablet Take 1 tablet by mouth daily.    [provider]  Chromium 1000 MCG TABS Take 1,000 mcg by mouth daily.    [provider]  Cinnamon 500 MG capsule Take 1,000 mg by mouth daily.    [provider]  CRANBERRY PO Take 2 tablets by mouth daily. 4200 mg    [provider]  Cyanocobalamin (VITAMIN B-12) 2500 MCG SUBL Place 2,500 mcg under the tongue daily.    [provider]  DULoxetine (CYMBALTA) 20 MG capsule Take 1 capsule (20 mg total) by mouth daily. 12/07/22   [name], DO  ELDERBERRY PO Take 1 capsule by mouth daily.    [provider]  estradiol (ESTRACE) 0.1 MG/GM vaginal cream Place 1 Applicatorful vaginally every other day. In the evening 08/29/18   [provider]  fluticasone (FLONASE) 50 MCG/ACT nasal spray SHAKE LIQUID AND USE 2 SPRAYS IN EACH NOSTRIL DAILY 08/26/22   Nafziger, [name], NP  fluticasone (FLONASE) 50 MCG/ACT nasal spray Place 2 sprays into both nostrils daily. 09/29/22   Nafziger, [name], NP  Lancets (ONETOUCH DELICA PLUS 123XX123) [place] USE ONCE DAILY 10/15/20    Nafziger, [name], NP  lisinopril (ZESTRIL) 10 MG tablet TAKE 1 TABLET(10 MG) BY MOUTH DAILY 09/29/22   Nafziger, [name], NP  Misc Natural Products (TART CHERRY ADVANCED) CAPS Take 2 capsules by mouth daily.    [provider]  Multiple Vitamins-Minerals (MULTIVITAMIN WITH MINERALS) tablet Take 1 tablet by mouth daily. Centrum silver    [provider]  ONETOUCH VERIO test strip USE TWICE DAILY 07/02/21   Nafziger, [name], NP  OVER THE COUNTER MEDICATION Take 1 tablet by mouth daily as needed (constipation). Vital Lax    [provider]  polyethylene glycol (MIRALAX / GLYCOLAX) packet Take 17 g by mouth daily as needed for mild  constipation.    [provider]  predniSONE (DELTASONE) 10 MG tablet Take 1 tablet (10 mg total) by mouth daily with breakfast. 01/01/23   Nafziger, [name], NP  rosuvastatin (CRESTOR) 40 MG tablet TAKE 1 TABLET(40 MG) BY MOUTH DAILY 12/22/22   Nafziger, [name], NP  tiZANidine (ZANAFLEX) 2 MG tablet Take 1 tablet (2 mg total) by mouth at bedtime. 09/29/22   Nafziger, [name], NP  tolterodine (DETROL LA) 2 MG 24 hr capsule Take 1 capsule (2 mg total) by mouth daily. 12/15/22   Nafziger, [name], NP  Turmeric 500 MG CAPS Take 1,000 mg by mouth daily.     [provider]  valsartan (DIOVAN) 160 MG tablet Take 1/2 tablet by mouth once daily 12/18/11 02/12/12  Debbrah Alar, NP    Family History Family History  Problem Relation Age of Onset   Aneurysm Mother 25       deceased secondary to brain aneurysm   Liver cancer Father 32       deceased   Diabetes Other        grandmother   Colon cancer Neg Hx    Stomach cancer Neg Hx    Rectal cancer Neg Hx    Esophageal cancer Neg Hx    Breast cancer Neg Hx     Social History Social History   Tobacco Use   Smoking status: Former    Years: 1    Types: Cigarettes    Quit date: 02/28/1996    Years since quitting: 26.9   Smokeless tobacco: Never  Vaping Use   Vaping Use: Never used  Substance  Use Topics   Alcohol use: Yes    Alcohol/week: 4.0 standard drinks of alcohol    Types: 4 Standard drinks or equivalent per week    Comment: on weekends   Drug use: No     Allergies   Flexeril [cyclobenzaprine] and Penicillins   Review of Systems Review of Systems  Constitutional:  Negative for chills, fatigue and fever.  HENT:  Positive for congestion, postnasal drip, rhinorrhea, sinus pressure, sinus pain, sneezing and sore throat. Negative for dental problem.   Eyes:  Negative for discharge and itching.  Respiratory:  Positive for cough. Negative for shortness of breath and wheezing.   Cardiovascular:  Negative for chest pain.  Gastrointestinal:  Negative for abdominal pain.  Genitourinary:  Negative for dysuria.  Neurological:  Negative for headaches.     Physical Exam Triage Vital Signs ED Triage Vitals [02/19/23 1412]  Enc Vitals Group     BP (!) 150/78     Pulse Rate 68     Resp 16     Temp 97.8 F (36.6 C)     Temp Source Oral     SpO2 98 %     Weight      Height      Head Circumference      Peak Flow      Pain Score 0     Pain Loc      Pain Edu?      Excl. in [place]?    No data found.  Updated Vital Signs BP (!) 150/78 (BP Location: Right Arm)   Pulse 68   Temp 97.8 F (36.6 C) (Oral)   Resp 16   SpO2 98%   Visual Acuity Right Eye Distance:   Left Eye Distance:   Bilateral Distance:    Right Eye Near:   Left Eye Near:    Bilateral Near:  Physical Exam HENT:     Nose:     Right Turbinates: Enlarged and swollen.     Left Turbinates: Enlarged and swollen.     Left Sinus: Maxillary sinus tenderness present.     Mouth/Throat:     Mouth: Mucous membranes are moist.     Tongue: No lesions. Tongue does not deviate from midline.     Palate: No mass and lesions.     Pharynx: Uvula midline. Posterior oropharyngeal erythema present. No pharyngeal swelling, oropharyngeal exudate or uvula swelling.     Tonsils: No tonsillar exudate or tonsillar  abscesses.      UC Treatments / Results  Labs (all labs ordered are listed, but only abnormal results are displayed) Labs Reviewed - No data to display  EKG   Radiology No results found.  Procedures Procedures (including critical care time)  Medications Ordered in UC Medications - No data to display  Initial Impression / Assessment and Plan / UC Course  I have reviewed the triage vital signs and the nursing notes.  Pertinent labs & imaging results that were available during my care of the patient were reviewed by me and considered in my medical decision making (see chart for details).  Vitals in triage reviewed, patient is hemodynamically stable.  Presents to clinic with 5 days of sinus pressure, nasal drainage, cough and sneezing. Left sided maxillary tenderness on palpation, enlarged and swollen nasal turbinates.  Lungs vesicular, low concern for pneumonia or systemic infection.  Given a WASP for doxycycline to cover for acute bacterial sinusitis since symptoms will be ongoing for a week on Easter, and she is high risk for complications due to her age.  Advised with holding feeling the prescription until trying symptomatic relief for the next few days, patient verbalized understanding.  Return and follow-up precautions discussed, no questions at this time.    Final Clinical Impressions(s) / UC Diagnoses   Final diagnoses:  Acute non-recurrent maxillary sinusitis  Acute cough     Discharge Instructions      I am prescribing you doxycycline for a sinus infection, please wait to fill this to see if your symptoms get better over the next few days.  If they do not get better over the next few days, you can fill it and start for bacterial sinus infection.  For your cough you can continue to use the over-the-counter cough medication as well as the [employer]  You can sleep with a humidifier, drink tea with warm honey and do warm saline gargles to help with your sore throat.   You can alternate between Tylenol and ibuprofen for pain and discomfort.  Please return to clinic if you do not have any improvement in your symptoms over the next week, have any worsening symptoms or any changes.        ED Prescriptions     Medication Sig Dispense Auth. Provider   benzonatate (TESSALON) 100 MG capsule Take 1 capsule (100 mg total) by mouth every 8 (eight) hours. 21 capsule Louretta Shorten, [place], [place]   doxycycline (VIBRAMYCIN) 100 MG capsule Take 1 capsule (100 mg total) by mouth 2 (two) times daily. 20 capsule Josefita Weissmann, [place], [place]      PDMP not reviewed this encounter.   Kaelum Kissick, [place] N, [place] 02/19/23 [phone]

## 2023-02-19 NOTE — Discharge Instructions (Signed)
I am prescribing you doxycycline for a sinus infection, please wait to fill this to see if your symptoms get better over the next few days.  If they do not get better over the next few days, you can fill it and start for bacterial sinus infection.  For your cough you can continue to use the over-the-counter cough medication as well as the [employer]  You can sleep with a humidifier, drink tea with warm honey and do warm saline gargles to help with your sore throat.  You can alternate between Tylenol and ibuprofen for pain and discomfort.  Please return to clinic if you do not have any improvement in your symptoms over the next week, have any worsening symptoms or any changes.

## 2023-02-19 NOTE — ED Triage Notes (Signed)
Patient reports a productive cough with yellow/green sputum, nasal congestion with yellow and blood tinged drainage, and sneezing x 5 days.  Patient states she has been taking Mucinex. And the last dose was 1000 today. Patient also been taking [facility] brand of cold and cough and the last dose was last night.

## 2023-02-22 DIAGNOSIS — M9901 Segmental and somatic dysfunction of cervical region: Secondary | ICD-10-CM | POA: Diagnosis not present

## 2023-02-22 DIAGNOSIS — M5031 Other cervical disc degeneration,  high cervical region: Secondary | ICD-10-CM | POA: Diagnosis not present

## 2023-02-23 ENCOUNTER — Telehealth: Payer: 59 | Admitting: Adult Health

## 2023-02-23 DIAGNOSIS — M5416 Radiculopathy, lumbar region: Secondary | ICD-10-CM | POA: Diagnosis not present

## 2023-02-25 ENCOUNTER — Encounter: Payer: Self-pay | Admitting: Adult Health

## 2023-02-25 ENCOUNTER — Ambulatory Visit (INDEPENDENT_AMBULATORY_CARE_PROVIDER_SITE_OTHER): Payer: 59 | Admitting: Adult Health

## 2023-02-25 VITALS — BP 120/80 | HR 82 | Temp 97.6°F | Ht 63.0 in | Wt 148.0 lb

## 2023-02-25 DIAGNOSIS — E118 Type 2 diabetes mellitus with unspecified complications: Secondary | ICD-10-CM | POA: Diagnosis not present

## 2023-02-25 DIAGNOSIS — I10 Essential (primary) hypertension: Secondary | ICD-10-CM | POA: Diagnosis not present

## 2023-02-25 LAB — POCT GLYCOSYLATED HEMOGLOBIN (HGB A1C): Hemoglobin A1C: 6.4 % — AB (ref 4.0–5.6)

## 2023-02-25 NOTE — Patient Instructions (Addendum)
It was great seeing you today   Your A1c improved to 6.4!   I will see you in 6 months for your physical exam, you are due after October 4th

## 2023-02-25 NOTE — Progress Notes (Signed)
Subjective:    Patient ID: Cathy [name], female    DOB: [birth year], 77 y.o.   MRN: [id number]  HPI  She presents to the office today for 6 month follow up regarding DM and HTN   Diabetes mellitus-currently diet controlled.  She does not monitor her blood sugars at home on a routine basis but when she does her blood sugar readings are 120 or below.  She denies hypoglycemic events Lab Results  Component Value Date   HGBA1C 6.7 (H) 08/26/2022   Hypertension-managed with lisinopril 10 mg daily.  She denies dizziness, lightheadedness, chest pain, or shortness of breath BP Readings from Last 3 Encounters:  02/25/23 120/80  02/19/23 (!) 150/78  02/10/23 118/82   Review of Systems See HPI   Past Medical History:  Diagnosis Date   Chronic constipation    DJD (degenerative joint disease)    knees   Female cystocele    GERD (gastroesophageal reflux disease)    History of esophagitis    History of left inguinal hernia    History of MRSA infection    recurrent carbuncle   History of recurrent UTIs    Hyperlipidemia    Hypertension    Pre-diabetes    diet controlled   Urgency of urination     Social History   Socioeconomic History   Marital status: Single    Spouse name: Not on file   Number of children: Not on file   Years of education: Not on file   Highest education level: Not on file  Occupational History   Not on file  Tobacco Use   Smoking status: Former    Years: 1    Types: Cigarettes    Quit date: 02/28/1996    Years since quitting: 27.0   Smokeless tobacco: Never  Vaping Use   Vaping Use: Never used  Substance and Sexual Activity   Alcohol use: Yes    Alcohol/week: 4.0 standard drinks of alcohol    Types: 4 Standard drinks or equivalent per week    Comment: on weekends   Drug use: No   Sexual activity: Not on file  Other Topics Concern   Not on file  Social History Narrative   Single   Former Smoker  -  quit 10 to 11 years ago (light smoker)    Alcohol use-yes     2 children    Occupation: [employer]    Social Determinants of Health   Financial Resource Strain: [place]  (07/14/2022)   Overall Financial Resource Strain (CARDIA)    Difficulty of Paying Living Expenses: Not hard at all  Food Insecurity: No Food Insecurity (07/14/2022)   Hunger Vital Sign    Worried About Running Out of Food in the Last Year: Never true    [place] in the Last Year: Never true  Transportation Needs: No Transportation Needs (07/14/2022)   PRAPARE - Hydrologist (Medical): No    Lack of Transportation (Non-Medical): No  Physical Activity: Sufficiently Active (07/14/2022)   Exercise Vital Sign    Days of Exercise per Week: 3 days    Minutes of Exercise per Session: 70 min  Stress: No Stress Concern Present (07/14/2022)   [place]    Feeling of Stress : Not at all  Social Connections: Moderately Integrated (07/14/2022)   Social Connection and Isolation Panel [NHANES]    Frequency of Communication  with Friends and Family: More than three times a week    Frequency of Social Gatherings with Friends and Family: More than three times a week    Attends Religious Services: More than 4 times per year    Active Member of Clubs or Organizations: Yes    Attends Archivist Meetings: More than 4 times per year    Marital Status: Never married  Intimate Partner Violence: Not At Risk (07/14/2022)   Humiliation, Afraid, Rape, and Kick questionnaire    Fear of Current or Ex-Partner: No    Emotionally Abused: No    Physically Abused: No    Sexually Abused: No    Past Surgical History:  Procedure Laterality Date   APPENDECTOMY  1980's   BREAST EXCISIONAL BIOPSY Left 1989   COLONOSCOPY  last one 06-18-2015   INGUINAL HERNIA REPAIR Left 05-10-2001   LAPAROSCOPIC LYSIS OF ADHESIONS  01/17/2019   OOPHORECTOMY Right 01/17/2019    PERINEOPLASTY  01/17/2019   SALPINGECTOMY Left 01/17/2019   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 SACROSPINUS FIXATION, AUGMENTATION WITH AXIS DERMIS;  Surgeon: Carolan Clines, MD;  Location: [facility];  Service: Urology;  Laterality: N/A;    Family History  Problem Relation Age of Onset   Aneurysm Mother 20       deceased secondary to brain aneurysm   Liver cancer Father 28       deceased   Diabetes Other        grandmother   Colon cancer Neg Hx    Stomach cancer Neg Hx    Rectal cancer Neg Hx    Esophageal cancer Neg Hx    Breast cancer Neg Hx     Allergies  Allergen Reactions   Flexeril [Cyclobenzaprine]     Hallucinations    Penicillins Hives    Has patient had a PCN reaction causing immediate rash, facial/tongue/throat swelling, SOB or lightheadedness with hypotension: Yes Has patient had a PCN reaction causing severe rash involving mucus membranes or skin necrosis: No Has patient had a PCN reaction that required hospitalization: No Has patient had a PCN reaction occurring within the last 10 years: No If all of the above answers are "NO", then may proceed with Cephalosporin use.     Current Outpatient Medications on File Prior to Visit  Medication Sig Dispense Refill   Ascorbic Acid (VITAMIN C) 1000 MG tablet Take 1,000 mg by mouth daily.     aspirin 81 MG tablet Take 1 tablet (81 mg total) by mouth 2 (two) times daily after a meal. (Patient taking differently: Take 81 mg by  mouth daily.) 60 tablet 0   b complex vitamins capsule Take 1 capsule by mouth daily.     benzonatate (TESSALON) 100 MG capsule Take 1 capsule (100 mg total) by mouth every 8 (eight) hours. 21 capsule 0   Biotin 1000 MCG tablet Take 1,000 mcg by mouth daily.     Blood Glucose Monitoring Suppl (ONETOUCH VERIO FLEX SYSTEM) w/Device KIT TEST TWICE DAILY 1 kit 0  Calcium Carbonate-Vitamin D 600-400 MG-UNIT tablet Take 1 tablet by mouth daily.     Chromium 1000 MCG TABS Take 1,000 mcg by mouth daily.     Cinnamon 500 MG capsule Take 1,000 mg by mouth daily.     CRANBERRY PO Take 2 tablets by mouth daily. 4200 mg     Cyanocobalamin (VITAMIN B-12) 2500 MCG SUBL Place 2,500 mcg under the tongue daily.     doxycycline (VIBRAMYCIN) 100 MG capsule Take 1 capsule (100 mg total) by mouth 2 (two) times daily. 20 capsule 0   DULoxetine (CYMBALTA) 20 MG capsule Take 1 capsule (20 mg total) by mouth daily. 30 capsule 0   ELDERBERRY PO Take 1 capsule by mouth daily.     estradiol (ESTRACE) 0.1 MG/GM vaginal cream Place 1 Applicatorful vaginally every other day. In the evening  3   fluticasone (FLONASE) 50 MCG/ACT nasal spray SHAKE LIQUID AND USE 2 SPRAYS IN EACH NOSTRIL DAILY 48 g 2   fluticasone (FLONASE) 50 MCG/ACT nasal spray Place 2 sprays into both nostrils daily. 16 g 6   Lancets (ONETOUCH DELICA PLUS 123XX123) [place] USE ONCE DAILY 100 each 3   lisinopril (ZESTRIL) 10 MG tablet TAKE 1 TABLET(10 MG) BY MOUTH DAILY 90 tablet 3   Misc Natural Products (TART CHERRY ADVANCED) CAPS Take 2 capsules by mouth daily.     Multiple Vitamins-Minerals (MULTIVITAMIN WITH MINERALS) tablet Take 1 tablet by mouth daily. Centrum silver     ONETOUCH VERIO test strip USE TWICE DAILY 200 strip 3   OVER THE COUNTER MEDICATION Take 1 tablet by mouth daily as needed (constipation). Vital Lax     polyethylene glycol (MIRALAX / GLYCOLAX) packet Take 17 g by mouth daily as needed for mild constipation.     predniSONE (DELTASONE) 10  MG tablet Take 1 tablet (10 mg total) by mouth daily with breakfast. 7 tablet 0   rosuvastatin (CRESTOR) 40 MG tablet TAKE 1 TABLET(40 MG) BY MOUTH DAILY 90 tablet 0   tiZANidine (ZANAFLEX) 2 MG tablet Take 1 tablet (2 mg total) by mouth at bedtime. 30 tablet 0   tolterodine (DETROL LA) 2 MG 24 hr capsule Take 1 capsule (2 mg total) by mouth daily. 90 capsule 0   Turmeric 500 MG CAPS Take 1,000 mg by mouth daily.      [DISCONTINUED] valsartan (DIOVAN) 160 MG tablet Take 1/2 tablet by mouth once daily 30 tablet 5   No current facility-administered medications on file prior to visit.    BP 120/80   Pulse 82   Temp 97.6 F (36.4 C) (Oral)   Ht 5\' 3"  (1.6 m)   Wt 148 lb (67.1 kg)   SpO2 99%   BMI 26.22 kg/m       Objective:   Physical Exam Vitals and nursing note reviewed.  Constitutional:      Appearance: Normal appearance.  Cardiovascular:     Rate and Rhythm: Normal rate and regular rhythm.     Pulses: Normal pulses.     Heart sounds: Normal heart sounds.  Pulmonary:     Effort: Pulmonary effort is normal.     Breath sounds: Normal breath sounds.  Musculoskeletal:        General: Normal range of motion.  Skin:    General: Skin is warm and dry.  Neurological:     General: No focal deficit present.     Mental Status: She is alert and oriented to person, place, and time.  Psychiatric:  Mood and Affect: Mood normal.        Behavior: Behavior normal.        Thought Content: Thought content normal.        Judgment: Judgment normal.       Assessment & Plan:  1. Controlled type 2 diabetes mellitus with complication, without long-term current use of insulin  - POC HgB A1c- 6.4 - has improved  - Follow up in 6 months for CPE   2. Essential hypertension - Well controlled. No change in medication   [name], NP

## 2023-03-02 DIAGNOSIS — M5416 Radiculopathy, lumbar region: Secondary | ICD-10-CM | POA: Diagnosis not present

## 2023-03-10 DIAGNOSIS — M1712 Unilateral primary osteoarthritis, left knee: Secondary | ICD-10-CM | POA: Diagnosis not present

## 2023-03-25 ENCOUNTER — Other Ambulatory Visit: Payer: Self-pay | Admitting: Adult Health

## 2023-03-25 DIAGNOSIS — N3281 Overactive bladder: Secondary | ICD-10-CM

## 2023-03-29 DIAGNOSIS — M5416 Radiculopathy, lumbar region: Secondary | ICD-10-CM | POA: Diagnosis not present

## 2023-04-01 ENCOUNTER — Other Ambulatory Visit: Payer: Self-pay | Admitting: Adult Health

## 2023-04-07 DIAGNOSIS — M170 Bilateral primary osteoarthritis of knee: Secondary | ICD-10-CM | POA: Diagnosis not present

## 2023-04-08 DIAGNOSIS — M5416 Radiculopathy, lumbar region: Secondary | ICD-10-CM | POA: Diagnosis not present

## 2023-04-27 ENCOUNTER — Other Ambulatory Visit: Payer: Self-pay | Admitting: Adult Health

## 2023-04-27 DIAGNOSIS — Z1231 Encounter for screening mammogram for malignant neoplasm of breast: Secondary | ICD-10-CM

## 2023-04-29 NOTE — Progress Notes (Signed)
[name] [address] [place] [address] Phone: [phone] Subjective:   Cathy [name], am serving as a scribe for Dr. Antoine Primas.  I'm seeing this patient by the request  of:  Shirline Frees, NP  CC: Bilateral knee and bilateral wrist pain  QIO:[id number]  02/10/2023 Worsening pain on the left knee.  Given injection again.  Tolerated the procedure well.  Discussed icing regimen and home exercises.  Hopeful that this will make an improvement.  Could be a candidate for viscosupplementation but did not have any significant benefit she states from the February 2023.  That said we did not get to weeks to do another steroid injection till June of that year.  Patient is a diabetic and will watch blood sugars closely.  Patient warned about this and understands.  Follow-up with me again 2 to 3 months.  Patient did have the hip replacement and wants to avoid any type of surgical intervention again.     Discussed with patient this continues to give her difficulty she should consider the possibility of surgical intervention for this chronic problem with exacerbating factors.  Patient is noncompliant with some of this pacing.  Patient feels that she does not want to have any surgical intervention at this time.  Discussed different medications but patient has had side effects to also does not take them regularly.  Follow-up with me again in 2 to 3 months      Update 05/05/2023 Cathy [name] is a 77 y.o. female coming in with complaint of B knee and B wrist pain. Patient states both wrist and knees are hurting. Would like injections. No other concerns.      Past Medical History:  Diagnosis Date   Chronic constipation    DJD (degenerative joint disease)    knees   Female cystocele    GERD (gastroesophageal reflux disease)    History of esophagitis    History of left inguinal hernia    History of MRSA infection    recurrent carbuncle   History of  recurrent UTIs    Hyperlipidemia    Hypertension    Pre-diabetes    diet controlled   Urgency of urination    Past Surgical History:  Procedure Laterality Date   APPENDECTOMY  1980's   BREAST EXCISIONAL BIOPSY Left 1989   COLONOSCOPY  last one 06-18-2015   INGUINAL HERNIA REPAIR Left 05-10-2001   LAPAROSCOPIC LYSIS OF ADHESIONS  01/17/2019   OOPHORECTOMY Right 01/17/2019   PERINEOPLASTY  01/17/2019   SALPINGECTOMY Left 01/17/2019   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 SACROSPINUS FIXATION, AUGMENTATION WITH AXIS DERMIS;  Surgeon: Jethro Bolus, MD;  Location: [facility];  Service: Urology;  Laterality: N/A;   Social History   Socioeconomic History   Marital status: Single    Spouse name: Not on file   Number of children: Not on file   Years of education: Not on file   Highest education level: Not on file  Occupational History   Not  on file  Tobacco Use   Smoking status: Former    Years: 1    Types: Cigarettes    Quit date: 02/28/1996    Years since quitting: 27.2   Smokeless tobacco: Never  Vaping Use   Vaping Use: Never used  Substance and Sexual Activity   Alcohol use: Yes    Alcohol/week: 4.0 standard drinks of alcohol    Types: 4 Standard drinks or equivalent per week    Comment: on weekends   Drug use: No   Sexual activity: Not on file  Other Topics Concern   Not on file  Social History Narrative   Single   Former Smoker  -  quit 10 to 11 years ago (light smoker)    Alcohol use-yes     2 children    Occupation: [employer]    Social Determinants of Health   Financial Resource Strain: Low Risk  (07/14/2022)   Overall Financial Resource Strain (CARDIA)    Difficulty of Paying Living Expenses: Not hard at all  Food Insecurity: No Food Insecurity (07/14/2022)   Hunger Vital Sign    Worried About Running Out of Food in the Last Year: Never true    Ran Out of Food in the Last Year: Never true  Transportation Needs: No Transportation Needs (07/14/2022)   PRAPARE - Administrator, Civil Service (Medical): No    Lack of Transportation (Non-Medical): No  Physical Activity: Sufficiently Active (07/14/2022)   Exercise Vital Sign    Days of Exercise per Week: 3 days    Minutes of Exercise per Session: 70 min  Stress: No Stress Concern Present (07/14/2022)   [employer] of Occupational Health - Occupational Stress Questionnaire    Feeling of Stress : Not at all  Social Connections: Moderately Integrated (07/14/2022)   Social Connection and Isolation Panel [NHANES]    Frequency of Communication with Friends and Family: More than three times a week    Frequency of Social Gatherings with Friends and Family: More than three times a week    Attends Religious Services: More than 4 times per year    Active Member of [employer] or Organizations: Yes    Attends Engineer, structural: More than 4 times per year    Marital Status: Never married   Allergies  Allergen Reactions   Flexeril [Cyclobenzaprine]     Hallucinations    Penicillins Hives    Has patient had a PCN reaction causing immediate rash, facial/tongue/throat swelling, SOB or lightheadedness with hypotension: Yes Has patient had a PCN reaction causing severe rash involving mucus membranes or skin necrosis: No Has patient had a PCN reaction that required hospitalization: No Has patient had a PCN reaction occurring within the last 10 years: No If all of the above answers  are "NO", then may proceed with Cephalosporin use.    Family History  Problem Relation Age of Onset   Aneurysm Mother 66       deceased secondary to brain aneurysm   Liver cancer Father 72       deceased   Diabetes Other        grandmother   Colon cancer Neg Hx    Stomach cancer Neg Hx    Rectal cancer Neg Hx    Esophageal cancer Neg Hx    Breast cancer Neg Hx      Current Outpatient Medications (Cardiovascular):    lisinopril (ZESTRIL) 10 MG tablet, TAKE 1 TABLET(10 MG) BY  MOUTH DAILY   rosuvastatin (CRESTOR) 40 MG tablet, TAKE 1 TABLET(40 MG) BY MOUTH DAILY  Current Outpatient Medications (Respiratory):    benzonatate (TESSALON) 100 MG capsule, Take 1 capsule (100 mg total) by mouth every 8 (eight) hours.   fluticasone (FLONASE) 50 MCG/ACT nasal spray, SHAKE LIQUID AND USE 2 SPRAYS IN EACH NOSTRIL DAILY   fluticasone (FLONASE) 50 MCG/ACT nasal spray, Place 2 sprays into both nostrils daily.  Current Outpatient Medications (Analgesics):    aspirin 81 MG tablet, Take 1 tablet (81 mg total) by mouth 2 (two) times daily after a meal. (Patient taking differently: Take 81 mg by mouth daily.)  Current Outpatient Medications (Hematological):    Cyanocobalamin (VITAMIN B-12) 2500 MCG SUBL, Place 2,500 mcg under the tongue daily.  Current Outpatient Medications (Other):    Ascorbic Acid (VITAMIN C) 1000 MG tablet, Take 1,000 mg by mouth daily.   b complex vitamins capsule, Take 1 capsule by mouth daily.   Biotin 1000 MCG tablet, Take 1,000 mcg by mouth daily.   Blood Glucose Monitoring Suppl (ONETOUCH VERIO FLEX SYSTEM) w/Device KIT, TEST TWICE DAILY   Calcium Carbonate-Vitamin D 600-400 MG-UNIT tablet, Take 1 tablet by mouth daily.   Chromium 1000 MCG TABS, Take 1,000 mcg by mouth daily.   Cinnamon 500 MG capsule, Take 1,000 mg by mouth daily.   ciprofloxacin (CIPRO) 500 MG tablet, Take 1 tablet (500 mg total) by mouth 2 (two) times daily for 5 days.   CRANBERRY PO, Take 2 tablets  by mouth daily. 4200 mg   doxycycline (VIBRAMYCIN) 100 MG capsule, Take 1 capsule (100 mg total) by mouth 2 (two) times daily.   DULoxetine (CYMBALTA) 20 MG capsule, Take 1 capsule (20 mg total) by mouth daily.   ELDERBERRY PO, Take 1 capsule by mouth daily.   estradiol (ESTRACE) 0.1 MG/GM vaginal cream, Place 1 Applicatorful vaginally every other day. In the evening   Lancets (ONETOUCH DELICA PLUS LANCET33G) MISC, USE ONCE DAILY   Misc Natural Products (TART CHERRY ADVANCED) CAPS, Take 2 capsules by mouth daily.   Multiple Vitamins-Minerals (MULTIVITAMIN WITH MINERALS) tablet, Take 1 tablet by mouth daily. Centrum silver   ONETOUCH VERIO test strip, USE TWICE DAILY   OVER THE COUNTER MEDICATION, Take 1 tablet by mouth daily as needed (constipation). Vital Lax   polyethylene glycol (MIRALAX / GLYCOLAX) packet, Take 17 g by mouth daily as needed for mild constipation.   tiZANidine (ZANAFLEX) 2 MG tablet, Take 1 tablet (2 mg total) by mouth at bedtime.   Turmeric 500 MG CAPS, Take 1,000 mg by mouth daily.    Reviewed prior external information including notes and imaging from  primary care provider As well as notes that were available from care everywhere and other healthcare systems.  Past medical history, social, surgical and family history all reviewed in electronic medical record.  No pertanent information unless stated regarding to the chief complaint.   Review of Systems:  No headache, visual changes, nausea, vomiting, diarrhea, constipation, dizziness, abdominal pain, skin rash, fevers, chills, night sweats, weight loss, swollen lymph nodes, body aches, joint swelling, chest pain, shortness of breath, mood changes. POSITIVE muscle aches  Objective  Blood pressure 138/82, pulse 88, height 5\' 3"  (1.6 m), weight 153 lb (69.4 kg), SpO2 97 %.   General: No apparent distress alert and oriented x3 mood and affect normal, dressed appropriately.  HEENT: Pupils equal, extraocular movements  intact  Respiratory: Patient's speak in full sentences and does not appear short of breath  Cardiovascular: No lower extremity edema, non tender, no erythema  Severely antalgic gait with patient's knees noted.  Instability of the knees bilaterally with valgus and varus force.  More tenderness to palpation over the left knee to palpation than the right.  Bilateral wrist to have positive Tinel's noted.  Mild thenar eminence wasting on the right side.  Good grip strength bilaterally.  After informed written and verbal consent, patient was seated on exam table. Right knee was prepped with alcohol swab and utilizing anterolateral approach, patient's right knee space was injected with 4:1  marcaine 0.5%: Kenalog 40mg /dL. Patient tolerated the procedure well without immediate complications.  After informed written and verbal consent, patient was seated on exam table. Left knee was prepped with alcohol swab and utilizing anterolateral approach, patient's left knee space was injected with 4:1  marcaine 0.5%: Kenalog 40mg /dL. Patient tolerated the procedure well without immediate complications.  Procedure: Real-time Ultrasound Guided Injection of right carpal tunnel Device: GE Logiq Q7  Ultrasound guided injection is preferred based studies that show increased duration, increased effect, greater accuracy, decreased procedural pain, increased response rate with ultrasound guided versus blind injection.  Verbal informed consent obtained.  Time-out conducted.  Noted no overlying erythema, induration, or other signs of local infection.  Skin prepped in a sterile fashion.  Local anesthesia: Topical Ethyl chloride.  With sterile technique and under real time ultrasound guidance:  median nerve visualized.  23g 5/8 inch needle inserted distal to proximal approach into nerve sheath. Pictures taken nfor needle placement. Patient did have injection of 0.5  of 0.5% Marcaine, and 1 cc of Kenalog 40 mg/dL. Completed  without difficulty  Advised to call if fevers/chills, erythema, induration, drainage, or persistent bleeding.  Impression: Technically successful ultrasound guided injection.  Procedure: Real-time Ultrasound Guided Injection of left carpal tunnel Device: GE Logiq Q7 Ultrasound guided injection is preferred based studies that show increased duration, increased effect, greater accuracy, decreased procedural pain, increased response rate with ultrasound guided versus blind injection.  Verbal informed consent obtained.  Time-out conducted.  Noted no overlying erythema, induration, or other signs of local infection.  Skin prepped in a sterile fashion.  Local anesthesia: Topical Ethyl chloride.  With sterile technique and under real time ultrasound guidance:  median nerve visualized.  23g 5/8 inch needle inserted distal to proximal approach into nerve sheath. Pictures taken nfor needle placement. Patient did have injection of 0.5 cc of 0.5% Marcaine, and 1 cc of Kenalog 40 mg/dL. Completed without difficulty  Advised to call if fevers/chills, erythema, induration, drainage, or persistent bleeding. t.  Impression: Technically successful ultrasound guided injection.    Impression and Recommendations:      The above documentation has been reviewed and is accurate and complete Judi Saa, DO

## 2023-05-04 ENCOUNTER — Ambulatory Visit (INDEPENDENT_AMBULATORY_CARE_PROVIDER_SITE_OTHER): Payer: 59 | Admitting: Adult Health

## 2023-05-04 ENCOUNTER — Encounter: Payer: Self-pay | Admitting: Adult Health

## 2023-05-04 VITALS — BP 120/60 | HR 78 | Temp 97.9°F | Ht 63.0 in | Wt 153.0 lb

## 2023-05-04 DIAGNOSIS — M47816 Spondylosis without myelopathy or radiculopathy, lumbar region: Secondary | ICD-10-CM | POA: Diagnosis not present

## 2023-05-04 DIAGNOSIS — N3000 Acute cystitis without hematuria: Secondary | ICD-10-CM | POA: Diagnosis not present

## 2023-05-04 DIAGNOSIS — M159 Polyosteoarthritis, unspecified: Secondary | ICD-10-CM | POA: Diagnosis not present

## 2023-05-04 LAB — POCT URINALYSIS DIPSTICK
Bilirubin, UA: NEGATIVE
Blood, UA: NEGATIVE
Glucose, UA: NEGATIVE
Ketones, UA: NEGATIVE
Nitrite, UA: POSITIVE
Protein, UA: NEGATIVE
Spec Grav, UA: 1.01 (ref 1.010–1.025)
Urobilinogen, UA: 0.2 E.U./dL
pH, UA: 7 (ref 5.0–8.0)

## 2023-05-04 MED ORDER — CIPROFLOXACIN HCL 500 MG PO TABS: 500.0000 mg | ORAL_TABLET | Freq: Two times a day (BID) | ORAL | 0 refills | Status: AC

## 2023-05-04 MED ORDER — DULOXETINE HCL 20 MG PO CPEP: 20.0000 mg | ORAL_CAPSULE | Freq: Every day | ORAL | 0 refills | Status: DC

## 2023-05-04 NOTE — Progress Notes (Signed)
Subjective:    Patient ID: Cathy [name], female    DOB: 1946/09/24, 77 y.o.   MRN: [id number]  Urinary Frequency  Associated symptoms include frequency.   77 year old female who  has a past medical history of Chronic constipation, DJD (degenerative joint disease), Female cystocele, GERD (gastroesophageal reflux disease), History of esophagitis, History of left inguinal hernia, History of MRSA infection, History of recurrent UTIs, Hyperlipidemia, Hypertension, Pre-diabetes, and Urgency of urination.  She presents to the office today for suspected UTI. She reports that for the last two weeks she has had dysuria, frequency, urgency, lower pelvic pressure, and low back pain.   She is also complaining of worsening arthritis pain in both hands. She has tried various OTC medications and has recently started using Voltaren gel and has not seen any relief. She was prescribed Cymbalta back in January by Sports Medicine and believed this may have helped. She would like me to send in a refill.    Review of Systems  Genitourinary:  Positive for frequency.   See HPI   Past Medical History:  Diagnosis Date   Chronic constipation    DJD (degenerative joint disease)    knees   Female cystocele    GERD (gastroesophageal reflux disease)    History of esophagitis    History of left inguinal hernia    History of MRSA infection    recurrent carbuncle   History of recurrent UTIs    Hyperlipidemia    Hypertension    Pre-diabetes    diet controlled   Urgency of urination     Social History   Socioeconomic History   Marital status: Single    Spouse name: Not on file   Number of children: Not on file   Years of education: Not on file   Highest education level: Not on file  Occupational History   Not on file  Tobacco Use   Smoking status: Former    Years: 1    Types: Cigarettes    Quit date: 02/28/1996    Years since quitting: 27.1   Smokeless tobacco: Never  Vaping Use   Vaping Use:  Never used  Substance and Sexual Activity   Alcohol use: Yes    Alcohol/week: 4.0 standard drinks of alcohol    Types: 4 Standard drinks or equivalent per week    Comment: on weekends   Drug use: No   Sexual activity: Not on file  Other Topics Concern   Not on file  Social History Narrative   Single   Former Smoker  -  quit 10 to 11 years ago (light smoker)   Alcohol use-yes     2 children    Occupation: [employer]    Social Determinants of Health   Financial Resource Strain: Low Risk  (07/14/2022)   Overall Financial Resource Strain (CARDIA)    Difficulty of Paying Living Expenses: Not hard at all  Food Insecurity: No Food Insecurity (07/14/2022)   Hunger Vital Sign    Worried About Running Out of Food in the Last Year: Never true    Ran Out of Food in the Last Year: Never true  Transportation Needs: No Transportation Needs (07/14/2022)   PRAPARE - Administrator, Civil Service (Medical): No    Lack of Transportation (Non-Medical): No  Physical Activity: Sufficiently Active (07/14/2022)   Exercise Vital Sign    Days of Exercise per Week: 3 days    Minutes of Exercise  per Session: 70 min  Stress: No Stress Concern Present (07/14/2022)   [employer] of Occupational Health - Occupational Stress Questionnaire    Feeling of Stress : Not at all  Social Connections: Moderately Integrated (07/14/2022)   Social Connection and Isolation Panel [NHANES]    Frequency of Communication with Friends and Family: More than three times a week    Frequency of Social Gatherings with Friends and Family: More than three times a week    Attends Religious Services: More than 4 times per year    Active Member of Clubs or Organizations: Yes    Attends Banker Meetings: More than 4 times per year    Marital Status: Never married  Intimate Partner Violence: Not At Risk (07/14/2022)   Humiliation, Afraid, Rape, and Kick questionnaire    Fear of Current or  Ex-Partner: No    Emotionally Abused: No    Physically Abused: No    Sexually Abused: No    Past Surgical History:  Procedure Laterality Date   APPENDECTOMY  1980's   BREAST EXCISIONAL BIOPSY Left 1989   COLONOSCOPY  last one 06-18-2015   INGUINAL HERNIA REPAIR Left 05-10-2001   LAPAROSCOPIC LYSIS OF ADHESIONS  01/17/2019   OOPHORECTOMY Right 01/17/2019   PERINEOPLASTY  01/17/2019   SALPINGECTOMY Left 01/17/2019   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 SACROSPINUS FIXATION, AUGMENTATION WITH AXIS DERMIS;  Surgeon: Jethro Bolus, MD;  Location: [facility];  Service: Urology;  Laterality: N/A;    Family History  Problem Relation Age of Onset   Aneurysm Mother 72       deceased secondary to brain aneurysm   Liver cancer Father 52       deceased   Diabetes Other        grandmother   Colon cancer Neg Hx    Stomach cancer Neg Hx    Rectal cancer Neg Hx    Esophageal cancer Neg Hx    Breast cancer Neg Hx     Allergies  Allergen Reactions   Flexeril [Cyclobenzaprine]     Hallucinations    Penicillins Hives    Has patient had a PCN reaction causing immediate rash, facial/tongue/throat swelling, SOB or lightheadedness with hypotension: Yes Has patient had a PCN reaction causing severe rash involving mucus membranes or skin necrosis: No Has patient had a PCN reaction that required hospitalization: No Has patient had a PCN reaction occurring within the last 10 years: No If all of the above  answers are "NO", then may proceed with Cephalosporin use.     Current Outpatient Medications on File Prior to Visit  Medication Sig Dispense Refill   Ascorbic Acid (VITAMIN C) 1000 MG tablet Take 1,000 mg by mouth daily.     aspirin 81 MG tablet Take 1 tablet (81 mg total) by mouth 2 (two) times daily after a meal. (Patient taking differently: Take 81 mg by mouth daily.) 60 tablet 0   b complex vitamins capsule Take 1 capsule by mouth daily.     benzonatate (  TESSALON) 100 MG capsule Take 1 capsule (100 mg total) by mouth every 8 (eight) hours. 21 capsule 0   Biotin 1000 MCG tablet Take 1,000 mcg by mouth daily.     Blood Glucose Monitoring Suppl (ONETOUCH VERIO FLEX SYSTEM) w/Device KIT TEST TWICE DAILY 1 kit 0   Calcium Carbonate-Vitamin D 600-400 MG-UNIT tablet Take 1 tablet by mouth daily.     Chromium 1000 MCG TABS Take 1,000 mcg by mouth daily.     Cinnamon 500 MG capsule Take 1,000 mg by mouth daily.     CRANBERRY PO Take 2 tablets by mouth daily. 4200 mg     Cyanocobalamin (VITAMIN B-12) 2500 MCG SUBL Place 2,500 mcg under the tongue daily.     doxycycline (VIBRAMYCIN) 100 MG capsule Take 1 capsule (100 mg total) by mouth 2 (two) times daily. 20 capsule 0   DULoxetine (CYMBALTA) 20 MG capsule Take 1 capsule (20 mg total) by mouth daily. 30 capsule 0   ELDERBERRY PO Take 1 capsule by mouth daily.     estradiol (ESTRACE) 0.1 MG/GM vaginal cream Place 1 Applicatorful vaginally every other day. In the evening  3   fluticasone (FLONASE) 50 MCG/ACT nasal spray SHAKE LIQUID AND USE 2 SPRAYS IN EACH NOSTRIL DAILY 48 g 2   fluticasone (FLONASE) 50 MCG/ACT nasal spray Place 2 sprays into both nostrils daily. 16 g 6   Lancets (ONETOUCH DELICA PLUS LANCET33G) MISC USE ONCE DAILY 100 each 3   lisinopril (ZESTRIL) 10 MG tablet TAKE 1 TABLET(10 MG) BY MOUTH DAILY 90 tablet 3   Misc Natural Products (TART CHERRY ADVANCED) CAPS Take 2 capsules by mouth daily.     Multiple Vitamins-Minerals  (MULTIVITAMIN WITH MINERALS) tablet Take 1 tablet by mouth daily. Centrum silver     ONETOUCH VERIO test strip USE TWICE DAILY 200 strip 3   OVER THE COUNTER MEDICATION Take 1 tablet by mouth daily as needed (constipation). Vital Lax     polyethylene glycol (MIRALAX / GLYCOLAX) packet Take 17 g by mouth daily as needed for mild constipation.     rosuvastatin (CRESTOR) 40 MG tablet TAKE 1 TABLET(40 MG) BY MOUTH DAILY 90 tablet 0   tiZANidine (ZANAFLEX) 2 MG tablet Take 1 tablet (2 mg total) by mouth at bedtime. 30 tablet 0   tolterodine (DETROL LA) 2 MG 24 hr capsule TAKE 1 CAPSULE(2 MG) BY MOUTH DAILY 90 capsule 0   Turmeric 500 MG CAPS Take 1,000 mg by mouth daily.      [DISCONTINUED] valsartan (DIOVAN) 160 MG tablet Take 1/2 tablet by mouth once daily 30 tablet 5   No current facility-administered medications on file prior to visit.    BP 120/60   Pulse 78   Temp 97.9 F (36.6 C) (Oral)   Ht 5\' 3"  (1.6 m)   Wt 153 lb (69.4 kg)   SpO2 99%   BMI 27.10 kg/m       Objective:   Physical Exam Vitals and nursing note reviewed.  Constitutional:      Appearance: Normal appearance.  Cardiovascular:     Rate and Rhythm: Normal rate and regular rhythm.     Pulses: Normal pulses.     Heart sounds: Normal heart sounds.  Pulmonary:     Effort: Pulmonary effort is normal.     Breath sounds: Normal breath sounds.  Abdominal:     General: Bowel sounds are normal.     Palpations: Abdomen is soft.     Tenderness: There is  abdominal tenderness. There is no right CVA tenderness or left CVA tenderness.  Musculoskeletal:        General: Normal range of motion.  Skin:    General: Skin is warm and dry.  Neurological:     General: No focal deficit present.     Mental Status: She is alert and oriented to person, place, and time.  Psychiatric:        Mood and Affect: Mood normal.        Behavior: Behavior normal.        Thought Content: Thought content normal.        Judgment: Judgment  normal.       Assessment & Plan:  1. Acute cystitis without hematuria  - ciprofloxacin (CIPRO) 500 MG tablet; Take 1 tablet (500 mg total) by mouth 2 (two) times daily for 5 days.  Dispense: 10 tablet; Refill: 0 - POC Urinalysis Dipstick  + leuks and Nitrites  - Culture, Urine  2. Primary osteoarthritis involving multiple joints  - DULoxetine (CYMBALTA) 20 MG capsule; Take 1 capsule (20 mg total) by mouth daily.  Dispense: 90 capsule; Refill: 0  Shirline Frees, NP

## 2023-05-05 ENCOUNTER — Other Ambulatory Visit: Payer: Self-pay

## 2023-05-05 ENCOUNTER — Ambulatory Visit (INDEPENDENT_AMBULATORY_CARE_PROVIDER_SITE_OTHER): Payer: 59 | Admitting: Family Medicine

## 2023-05-05 VITALS — BP 138/82 | HR 88 | Ht 63.0 in | Wt 153.0 lb

## 2023-05-05 DIAGNOSIS — M25532 Pain in left wrist: Secondary | ICD-10-CM

## 2023-05-05 DIAGNOSIS — M170 Bilateral primary osteoarthritis of knee: Secondary | ICD-10-CM | POA: Diagnosis not present

## 2023-05-05 DIAGNOSIS — M25531 Pain in right wrist: Secondary | ICD-10-CM | POA: Diagnosis not present

## 2023-05-05 DIAGNOSIS — G5603 Carpal tunnel syndrome, bilateral upper limbs: Secondary | ICD-10-CM

## 2023-05-05 NOTE — Patient Instructions (Signed)
See you again in 2-3 months Injections in both knees and wrist Good to see you!

## 2023-05-05 NOTE — Assessment & Plan Note (Signed)
Chronic, with worsening symptoms that are discussing daily activities.  Discussed with patient again at great length about icing regimen, home exercises, which activities to do and which ones to avoid.  Increase activity slowly.  Patient is considering the possibility of surgical intervention on the left knee in the relatively near future.  Wants to see how these injections do.  Follow-up with me again in 8 weeks otherwise.

## 2023-05-05 NOTE — Assessment & Plan Note (Signed)
Bilateral injections did respond well, discussed icing regimen and home exercises.  Discussed symptomatic and when to seek medical attention.  Right is worse than left on ultrasound today.  Discussed icing regimen and home exercises.  Discussed stretching mechanics and wearing braces.  Follow-up again in 2 to 3 months.  If worsening pain consider surgical intervention.

## 2023-05-07 ENCOUNTER — Telehealth: Payer: Self-pay | Admitting: Adult Health

## 2023-05-07 LAB — URINE CULTURE
MICRO NUMBER:: 15067972
SPECIMEN QUALITY:: ADEQUATE

## 2023-05-07 NOTE — Telephone Encounter (Signed)
Pt called, returning CMA's call. CMA was with a patient Pt asked that CMA call back at her earliest convenience. 

## 2023-05-11 DIAGNOSIS — N390 Urinary tract infection, site not specified: Secondary | ICD-10-CM | POA: Diagnosis not present

## 2023-05-19 DIAGNOSIS — M47816 Spondylosis without myelopathy or radiculopathy, lumbar region: Secondary | ICD-10-CM | POA: Diagnosis not present

## 2023-05-21 DIAGNOSIS — M170 Bilateral primary osteoarthritis of knee: Secondary | ICD-10-CM | POA: Diagnosis not present

## 2023-05-31 DIAGNOSIS — M25511 Pain in right shoulder: Secondary | ICD-10-CM | POA: Diagnosis not present

## 2023-06-01 ENCOUNTER — Ambulatory Visit
Admission: RE | Admit: 2023-06-01 | Discharge: 2023-06-01 | Disposition: A | Discharge status: Home / Self Care | Payer: 59 | Source: Ambulatory Visit | Attending: Adult Health | Admitting: Adult Health

## 2023-06-01 DIAGNOSIS — Z1231 Encounter for screening mammogram for malignant neoplasm of breast: Secondary | ICD-10-CM | POA: Diagnosis not present

## 2023-06-03 ENCOUNTER — Telehealth: Payer: Self-pay | Admitting: Adult Health

## 2023-06-03 NOTE — Telephone Encounter (Signed)
Clearance form faxed with confirmation

## 2023-06-03 NOTE — Telephone Encounter (Signed)
Pt called to ask if NP received her Surgical Clearance forms?  Please advise.

## 2023-06-03 NOTE — Telephone Encounter (Signed)
Patient notified of update  and verbalized understanding. 

## 2023-06-03 NOTE — Telephone Encounter (Signed)
Surgical clearance form received.

## 2023-06-07 DIAGNOSIS — M25662 Stiffness of left knee, not elsewhere classified: Secondary | ICD-10-CM | POA: Diagnosis not present

## 2023-06-07 DIAGNOSIS — M1732 Unilateral post-traumatic osteoarthritis, left knee: Secondary | ICD-10-CM | POA: Diagnosis not present

## 2023-06-07 DIAGNOSIS — R262 Difficulty in walking, not elsewhere classified: Secondary | ICD-10-CM | POA: Diagnosis not present

## 2023-06-08 ENCOUNTER — Other Ambulatory Visit: Payer: Self-pay | Admitting: Orthopaedic Surgery

## 2023-06-08 DIAGNOSIS — M533 Sacrococcygeal disorders, not elsewhere classified: Secondary | ICD-10-CM | POA: Diagnosis not present

## 2023-06-19 ENCOUNTER — Ambulatory Visit (HOSPITAL_COMMUNITY)
Admission: EM | Admit: 2023-06-19 | Discharge: 2023-06-19 | Disposition: A | Discharge status: Home / Self Care | Payer: 59 | Attending: Physician Assistant | Admitting: Physician Assistant

## 2023-06-19 ENCOUNTER — Encounter (HOSPITAL_COMMUNITY): Payer: Self-pay

## 2023-06-19 DIAGNOSIS — Z20822 Contact with and (suspected) exposure to covid-19: Secondary | ICD-10-CM | POA: Diagnosis not present

## 2023-06-19 DIAGNOSIS — R067 Sneezing: Secondary | ICD-10-CM | POA: Diagnosis not present

## 2023-06-19 NOTE — ED Triage Notes (Signed)
Patient c/o nasal congestion, sneezing, and a headache since yesterday.  Patient states she has been taking Tylenol and the last dose was at 0800 today.

## 2023-06-19 NOTE — ED Provider Notes (Signed)
[name]   MRN: [id number] DOB: Dec 04, 1945  Subjective:   Cathy [name] is a 77 y.o. female presenting for COVID-19 testing.  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  Patient started with congestion, sneezing, headache yesterday afternoon.  States that she did not sleep well last night.  Just not feeling like herself today.  More tired than normal.  Denies any dizziness.  No shortness of breath or chest pain.  No weakness or slurred speech.  No cough.  No fever or chills.  She has never had COVID-19 before and is not sure what symptoms really feel like.  No current facility-administered medications for this encounter.  Current Outpatient Medications:    Ascorbic Acid (VITAMIN C) 1000 MG tablet, Take 1,000 mg by mouth daily., Disp: , Rfl:    aspirin 81 MG tablet, Take 1 tablet (81 mg total) by mouth 2 (two) times daily after a meal. (Patient taking differently: Take 81 mg by mouth daily.), Disp: 60 tablet, Rfl: 0   b complex vitamins capsule, Take 1 capsule by mouth daily., Disp: , Rfl:    benzonatate (TESSALON) 100 MG capsule, Take 1 capsule (100 mg total) by mouth every 8 (eight) hours., Disp: 21 capsule, Rfl: 0   Biotin 1000 MCG tablet, Take 1,000 mcg by mouth daily., Disp: , Rfl:    Blood Glucose Monitoring Suppl (ONETOUCH VERIO FLEX SYSTEM) w/Device KIT, TEST TWICE DAILY, Disp: 1 kit, Rfl: 0   Calcium Carbonate-Vitamin D 600-400 MG-UNIT tablet, Take 1 tablet by mouth daily., Disp: , Rfl:    Chromium 1000 MCG TABS, Take 1,000 mcg by mouth daily., Disp: , Rfl:    Cinnamon 500 MG capsule, Take 1,000 mg by mouth daily., Disp: , Rfl:    CRANBERRY PO, Take 2 tablets by mouth daily. 4200 mg, Disp: , Rfl:    Cyanocobalamin (VITAMIN B-12) 2500 MCG SUBL, Place 2,500 mcg under the tongue daily., Disp: , Rfl:    doxycycline (VIBRAMYCIN) 100 MG capsule, Take 1 capsule (100 mg total) by mouth 2 (two)  times daily., Disp: 20 capsule, Rfl: 0   DULoxetine (CYMBALTA) 20 MG capsule, Take 1 capsule (20 mg total) by mouth daily., Disp: 90 capsule, Rfl: 0   ELDERBERRY PO, Take 1 capsule by mouth daily., Disp: , Rfl:    estradiol (ESTRACE) 0.1 MG/GM vaginal cream, Place 1 Applicatorful vaginally every other day. In the evening, Disp: , Rfl: 3   fluticasone (FLONASE) 50 MCG/ACT nasal spray, SHAKE LIQUID AND USE 2 SPRAYS IN EACH NOSTRIL DAILY, Disp: 48 g, Rfl: 2   fluticasone (FLONASE) 50 MCG/ACT nasal spray, Place 2 sprays into both nostrils daily., Disp: 16 g, Rfl: 6   Lancets (ONETOUCH DELICA PLUS LANCET33G) MISC, USE ONCE DAILY, Disp: 100 each, Rfl: 3   lisinopril (ZESTRIL) 10 MG tablet, TAKE 1 TABLET(10 MG) BY MOUTH DAILY, Disp: 90 tablet, Rfl: 3   Misc Natural Products (TART CHERRY ADVANCED) CAPS, Take 2 capsules by mouth daily., Disp: , Rfl:    Multiple Vitamins-Minerals (MULTIVITAMIN WITH MINERALS) tablet, Take 1 tablet by mouth daily. Centrum silver, Disp: , Rfl:    ONETOUCH VERIO test strip, USE TWICE DAILY, Disp: 200 strip, Rfl: 3   OVER THE COUNTER MEDICATION, Take 1 tablet by mouth daily as needed (constipation). Vital Lax, Disp: , Rfl:    polyethylene glycol (MIRALAX / GLYCOLAX) packet, Take 17 g by mouth daily as needed for  mild constipation., Disp: , Rfl:    rosuvastatin (CRESTOR) 40 MG tablet, TAKE 1 TABLET(40 MG) BY MOUTH DAILY, Disp: 90 tablet, Rfl: 0   tiZANidine (ZANAFLEX) 2 MG tablet, Take 1 tablet (2 mg total) by mouth at bedtime., Disp: 30 tablet, Rfl: 0   Turmeric 500 MG CAPS, Take 1,000 mg by mouth daily. , Disp: , Rfl:    Allergies  Allergen Reactions   Flexeril [Cyclobenzaprine]     Hallucinations    Penicillins Hives    Has patient had a PCN reaction causing immediate rash, facial/tongue/throat swelling, SOB or lightheadedness with hypotension: Yes Has patient had a PCN reaction causing severe rash involving mucus membranes or skin necrosis: No Has patient had a PCN  reaction that required hospitalization: No Has patient had a PCN reaction occurring within the last 10 years: No If all of the above answers are "NO", then may proceed with Cephalosporin use.     Past Medical History:  Diagnosis Date   Chronic constipation    DJD (degenerative joint disease)    knees   Female cystocele    GERD (gastroesophageal reflux disease)    History of esophagitis    History of left inguinal hernia    History of MRSA infection    recurrent carbuncle   History of recurrent UTIs    Hyperlipidemia    Hypertension    Pre-diabetes    diet controlled   Urgency of urination      Past Surgical History:  Procedure Laterality Date   APPENDECTOMY  1980's   BREAST EXCISIONAL BIOPSY Left 1989   COLONOSCOPY  last one 06-18-2015   INGUINAL HERNIA REPAIR Left 05-10-2001   LAPAROSCOPIC LYSIS OF ADHESIONS  01/17/2019   OOPHORECTOMY Right 01/17/2019   PERINEOPLASTY  01/17/2019   SALPINGECTOMY Left 01/17/2019   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 SACROSPINUS FIXATION, AUGMENTATION WITH AXIS DERMIS;  Surgeon: Jethro Bolus, MD;  Location: [facility];  Service: Urology;  Laterality: N/A;    Family History  Problem Relation Age of Onset   Aneurysm Mother 52       deceased secondary to brain aneurysm   Liver cancer Father 26       deceased   Diabetes Other        grandmother   Colon cancer Neg Hx    Stomach cancer Neg Hx    Rectal cancer Neg Hx    Esophageal cancer Neg  Hx    Breast cancer Neg Hx     Social History   Tobacco Use   Smoking status: Former    Current packs/day: 0.00    Types: Cigarettes    Start date: 02/28/1995    Quit date: 02/28/1996    Years since quitting: 27.3   Smokeless tobacco: Never  Vaping Use   Vaping status: Never Used  Substance Use Topics   Alcohol use: Yes    Alcohol/week: 4.0 standard drinks of alcohol    Types: 4 Standard drinks or equivalent per week    Comment: on weekends  Drug use: No    ROS REFER TO HPI FOR PERTINENT POSITIVES AND NEGATIVES   Objective:   Vitals: BP 138/84   Pulse 71   Temp 98 F (36.7 C) (Oral)   Resp 16   SpO2 96%   Physical Exam Vitals and nursing note reviewed.  Constitutional:      General: She is not in acute distress.    Appearance: Normal appearance. She is not ill-appearing.  HENT:     Head: Normocephalic.     Right Ear: Tympanic membrane, ear canal and external ear normal.     Left Ear: Tympanic membrane, ear canal and external ear normal.     Nose: No congestion.     Mouth/Throat:     Mouth: Mucous membranes are moist.     Pharynx: No oropharyngeal exudate or posterior oropharyngeal erythema.  Eyes:     Extraocular Movements: Extraocular movements intact.     Conjunctiva/sclera: Conjunctivae normal.     Pupils: Pupils are equal, round, and reactive to light.  Cardiovascular:     Rate and Rhythm: Normal rate and regular rhythm.     Pulses: Normal pulses.     Heart sounds: Normal heart sounds. No murmur heard. Pulmonary:     Effort: Pulmonary effort is normal. No respiratory distress.     Breath sounds: Normal breath sounds. No wheezing.  Musculoskeletal:     Cervical back: Normal range of motion.  Skin:    General: Skin is warm.  Neurological:     Mental Status: She is alert and oriented to person, place, and time.     Cranial Nerves: Cranial nerves 2-12 are intact.  Psychiatric:        Mood and Affect: Mood normal.        Behavior: Behavior normal.      No results found for this or any previous visit (from the past 24 hour(s)).  Assessment and Plan :   PDMP not reviewed this encounter.  1. Encounter for laboratory testing for COVID-19 virus   2. Sneezing     Reassured patient overall well-appearing on exam.  Vital signs are stable.  No neurodeficits noted.  She was worried about COVID-19, so we did proceed with testing today, although I doubt this is positive.  I think she is having seasonal allergy symptoms.  Recommended Claritin or Zyrtec to take over-the-counter as needed.  She can take Tylenol as needed for headache.  If by chance her COVID test does come back positive, she would be eligible for Paxlovid, but she would just need to hold her Crestor during the treatment course.    AllwardtCrist Infante, PA-C 06/19/23 1056

## 2023-06-19 NOTE — Discharge Instructions (Signed)
Good to meet you today.  If your COVID test is positive, you will receive a phone call from [place], please note this may take 24 to 48 hours to come back.  In the meantime, please try to stay hydrated, rest.  You can take Tylenol as needed for the headache.  You can take allergy medication such as Claritin or Zyrtec to help with some of the sneezing.  Follow-up with your primary care if persistent symptoms.

## 2023-06-20 LAB — SARS CORONAVIRUS 2 (TAT 6-24 HRS): SARS Coronavirus 2: NEGATIVE

## 2023-06-29 ENCOUNTER — Telehealth: Payer: Self-pay | Admitting: Adult Health

## 2023-06-29 ENCOUNTER — Encounter: Payer: Self-pay | Admitting: Adult Health

## 2023-06-29 ENCOUNTER — Ambulatory Visit (INDEPENDENT_AMBULATORY_CARE_PROVIDER_SITE_OTHER): Payer: 59 | Admitting: Adult Health

## 2023-06-29 VITALS — BP 110/70 | HR 82 | Temp 98.3°F | Ht 63.0 in | Wt 149.0 lb

## 2023-06-29 DIAGNOSIS — R350 Frequency of micturition: Secondary | ICD-10-CM

## 2023-06-29 DIAGNOSIS — J0100 Acute maxillary sinusitis, unspecified: Secondary | ICD-10-CM | POA: Diagnosis not present

## 2023-06-29 DIAGNOSIS — E1121 Type 2 diabetes mellitus with diabetic nephropathy: Secondary | ICD-10-CM

## 2023-06-29 LAB — POCT URINALYSIS DIPSTICK
Bilirubin, UA: NEGATIVE
Blood, UA: NEGATIVE
Glucose, UA: NEGATIVE
Ketones, UA: NEGATIVE
Leukocytes, UA: NEGATIVE
Nitrite, UA: NEGATIVE
Protein, UA: NEGATIVE
Spec Grav, UA: 1.015 — AB (ref 1.010–1.025)
Urobilinogen, UA: 0.2 E.U./dL
pH, UA: 7 (ref 5.0–8.0)

## 2023-06-29 MED ORDER — ONETOUCH VERIO VI STRP: 1.0000 | ORAL_STRIP | Freq: Two times a day (BID) | 3 refills | Status: DC

## 2023-06-29 MED ORDER — DOXYCYCLINE HYCLATE 100 MG PO CAPS
100.0000 mg | ORAL_CAPSULE | Freq: Two times a day (BID) | ORAL | 0 refills | Status: AC
Start: 2023-06-29 — End: 2023-07-06

## 2023-06-29 MED ORDER — ONETOUCH DELICA PLUS LANCET33G MISC: 3 refills | Status: DC

## 2023-06-29 MED ORDER — ROSUVASTATIN CALCIUM 40 MG PO TABS: 40.0000 mg | ORAL_TABLET | Freq: Every day | ORAL | 3 refills | Status: DC

## 2023-06-29 NOTE — Telephone Encounter (Signed)
Prescription Request  06/29/2023  LOV: 06/29/2023  What is the name of the medication or equipment? \ ONETOUCH VERIO test strip  Lancets (ONETOUCH DELICA PLUS LANCET33G) MISC   Have you contacted your pharmacy to request a refill? Yes   Which pharmacy would you like this sent to?  [id number] [place], [place] - [address] [place] [address] Phone: [phone] Fax: [phone]  [facility] [address]  - [address] [place] [address] Phone: [phone] Fax: [phone]    Patient notified that their request is being sent to the clinical staff for review and that they should receive a response within 2 business days.   Please advise at Mobile [phone] (mobile)

## 2023-06-29 NOTE — Telephone Encounter (Signed)
Rx refilled.

## 2023-06-29 NOTE — Progress Notes (Signed)
Subjective:    Patient ID: Cathy [name], female    DOB: 1945-12-19, 77 y.o.   MRN: [id number]  Urinary Frequency  Associated symptoms include frequency.   77 year old female who  has a past medical history of Chronic constipation, DJD (degenerative joint disease), Female cystocele, GERD (gastroesophageal reflux disease), History of esophagitis, History of left inguinal hernia, History of MRSA infection, History of recurrent UTIs, Hyperlipidemia, Hypertension, Pre-diabetes, and Urgency of urination.  She presents to the office today for concern of a UTI. She has a history of urinary tract infections. She reports that she been having frequent urination ( she also has a history of OAB). She reports that she is drinking more fluids as well. She has been seen by her urologist for OAB and was prescribed Myrbetriq,this did not help. We have also prescribed her Detrol LA and this did not help either.   She also reports that over the last 3 weeks she has had nasal congestion, discolored nasal congestion,  sinus pressure and front headaches. She has been using various OTC allergy medications without improvement. She has not had any fevers or chills.    Review of Systems  Genitourinary:  Positive for frequency.   See HPI   Past Medical History:  Diagnosis Date   Chronic constipation    DJD (degenerative joint disease)    knees   Female cystocele    GERD (gastroesophageal reflux disease)    History of esophagitis    History of left inguinal hernia    History of MRSA infection    recurrent carbuncle   History of recurrent UTIs    Hyperlipidemia    Hypertension    Pre-diabetes    diet controlled   Urgency of urination     Social History   Socioeconomic History   Marital status: Single    Spouse name: Not on file   Number of children: Not on file   Years of education: Not on file   Highest education level: Not on file  Occupational History   Not on file  Tobacco Use   Smoking  status: Former    Current packs/day: 0.00    Types: Cigarettes    Start date: 02/28/1995    Quit date: 02/28/1996    Years since quitting: 27.3   Smokeless tobacco: Never  Vaping Use   Vaping status: Never Used  Substance and Sexual Activity   Alcohol use: Yes    Alcohol/week: 4.0 standard drinks of alcohol    Types: 4 Standard drinks or equivalent per week    Comment: on weekends   Drug use: No   Sexual activity: Yes    Birth control/protection: Surgical  Other Topics Concern   Not on file  Social History Narrative   Single   Former Smoker  -  quit 10 to 11 years ago (light smoker)   Alcohol use-yes     2 children    Occupation: [employer]    Social Determinants of Health   Financial Resource Strain: Low Risk  (07/14/2022)   Overall Financial Resource Strain (CARDIA)    Difficulty of Paying Living Expenses: Not hard at all  Food Insecurity: No Food Insecurity (07/14/2022)   Hunger Vital Sign    Worried About Running Out of Food in the Last Year: Never true    Ran Out of Food in the Last Year: Never true  Transportation Needs: No Transportation Needs (07/14/2022)   PRAPARE - Transportation  Lack of Transportation (Medical): No    Lack of Transportation (Non-Medical): No  Physical Activity: Sufficiently Active (07/14/2022)   Exercise Vital Sign    Days of Exercise per Week: 3 days    Minutes of Exercise per Session: 70 min  Stress: No Stress Concern Present (07/14/2022)   [employer] of Occupational Health - Occupational Stress Questionnaire    Feeling of Stress : Not at all  Social Connections: Moderately Integrated (07/14/2022)   Social Connection and Isolation Panel [NHANES]    Frequency of Communication with Friends and Family: More than three times a week    Frequency of Social Gatherings with Friends and Family: More than three times a week    Attends Religious Services: More than 4 times per year    Active Member of Clubs or Organizations: Yes     Attends Banker Meetings: More than 4 times per year    Marital Status: Never married  Intimate Partner Violence: Not At Risk (07/14/2022)   Humiliation, Afraid, Rape, and Kick questionnaire    Fear of Current or Ex-Partner: No    Emotionally Abused: No    Physically Abused: No    Sexually Abused: No    Past Surgical History:  Procedure Laterality Date   APPENDECTOMY  1980's   BREAST EXCISIONAL BIOPSY Left 1989   COLONOSCOPY  last one 06-18-2015   INGUINAL HERNIA REPAIR Left 05-10-2001   LAPAROSCOPIC LYSIS OF ADHESIONS  01/17/2019   OOPHORECTOMY Right 01/17/2019   PERINEOPLASTY  01/17/2019   SALPINGECTOMY Left 01/17/2019   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 SACROSPINUS FIXATION, AUGMENTATION WITH AXIS DERMIS;  Surgeon: Jethro Bolus, MD;  Location: [facility];  Service: Urology;  Laterality: N/A;    Family History  Problem Relation Age of Onset   Aneurysm Mother 106       deceased secondary to brain aneurysm   Liver cancer Father 36       deceased   Diabetes Other        grandmother   Colon cancer Neg Hx    Stomach cancer Neg Hx    Rectal cancer Neg Hx    Esophageal cancer Neg Hx    Breast cancer Neg Hx     Allergies  Allergen Reactions   Flexeril [Cyclobenzaprine]     Hallucinations    Penicillins Hives    Has patient had a PCN reaction causing immediate rash, facial/tongue/throat swelling, SOB or lightheadedness with hypotension: Yes Has patient had a PCN  reaction causing severe rash involving mucus membranes or skin necrosis: No Has patient had a PCN reaction that required hospitalization: No Has patient had a PCN reaction occurring within the last 10 years: No If all of the above answers are "NO", then may proceed with Cephalosporin use.     Current Outpatient Medications on File Prior to Visit  Medication Sig Dispense Refill   Ascorbic Acid (VITAMIN C) 1000 MG tablet Take 1,000 mg by mouth daily.     aspirin 81 MG tablet Take 1 tablet (81  mg total) by mouth 2 (two) times daily after a meal. (Patient taking differently: Take 81 mg by mouth daily.) 60 tablet 0   b complex vitamins capsule Take 1 capsule by mouth daily.     benzonatate (TESSALON) 100 MG capsule Take 1 capsule (100 mg total) by mouth every 8 (eight) hours. 21 capsule 0   Biotin 1000 MCG tablet Take 1,000 mcg by mouth daily.     Blood Glucose Monitoring Suppl (ONETOUCH VERIO FLEX SYSTEM) w/Device KIT TEST TWICE DAILY 1 kit 0   Calcium Carbonate-Vitamin D 600-400 MG-UNIT tablet Take 1 tablet by mouth daily.     Chromium 1000 MCG TABS Take 1,000 mcg by mouth daily.     Cinnamon 500 MG capsule Take 1,000 mg by mouth daily.     CRANBERRY PO Take 2 tablets by mouth daily. 4200 mg     Cyanocobalamin (VITAMIN B-12) 2500 MCG SUBL Place 2,500 mcg under the tongue daily.     doxycycline (VIBRAMYCIN) 100 MG capsule Take 1 capsule (100 mg total) by mouth 2 (two) times daily. 20 capsule 0   DULoxetine (CYMBALTA) 20 MG capsule Take 1 capsule (20 mg total) by mouth daily. 90 capsule 0   ELDERBERRY PO Take 1 capsule by mouth daily.     estradiol (ESTRACE) 0.1 MG/GM vaginal cream Place 1 Applicatorful vaginally every other day. In the evening  3   fluticasone (FLONASE) 50 MCG/ACT nasal spray SHAKE LIQUID AND USE 2 SPRAYS IN EACH NOSTRIL DAILY 48 g 2   fluticasone (FLONASE) 50 MCG/ACT nasal spray Place 2 sprays into both nostrils daily. 16 g 6   Lancets (ONETOUCH DELICA PLUS LANCET33G) MISC USE  ONCE DAILY 100 each 3   lisinopril (ZESTRIL) 10 MG tablet TAKE 1 TABLET(10 MG) BY MOUTH DAILY 90 tablet 3   Misc Natural Products (TART CHERRY ADVANCED) CAPS Take 2 capsules by mouth daily.     Multiple Vitamins-Minerals (MULTIVITAMIN WITH MINERALS) tablet Take 1 tablet by mouth daily. Centrum silver     ONETOUCH VERIO test strip USE TWICE DAILY 200 strip 3   OVER THE COUNTER MEDICATION Take 1 tablet by mouth daily as needed (constipation). Vital Lax     polyethylene glycol (MIRALAX / GLYCOLAX) packet Take 17 g by mouth daily as needed for mild constipation.     rosuvastatin (CRESTOR) 40 MG tablet TAKE 1 TABLET(40 MG) BY MOUTH DAILY 90 tablet 0   tiZANidine (ZANAFLEX) 2 MG tablet Take 1 tablet (2 mg total) by mouth at bedtime. 30 tablet 0   Turmeric 500 MG CAPS Take 1,000 mg by mouth daily.      [DISCONTINUED] valsartan (DIOVAN) 160 MG tablet Take 1/2 tablet by mouth once daily 30 tablet 5   No current facility-administered medications on file prior to visit.    BP 110/70   Pulse 82   Temp 98.3 F (36.8 C) (Oral)   Ht 5\' 3"  (1.6 m)   Wt 149 lb (67.6 kg)   SpO2 97%   BMI 26.39 kg/m       Objective:   Physical Exam Vitals and nursing note reviewed.  Constitutional:      Appearance: Normal appearance.  HENT:     Nose: Congestion present. No rhinorrhea.     Right Turbinates: Enlarged and swollen.     Left Turbinates: Enlarged and swollen.     Right Sinus: Maxillary sinus tenderness and frontal sinus tenderness present.     Left Sinus: Maxillary sinus tenderness and frontal sinus tenderness  present.  Cardiovascular:     Rate and Rhythm: Normal rate and regular rhythm.     Pulses: Normal pulses.     Heart sounds: Normal heart sounds.  Pulmonary:     Effort: Pulmonary effort is normal.     Breath sounds: Normal breath sounds.  Abdominal:     General: Abdomen is flat. Bowel sounds are normal.     Palpations: Abdomen is soft.     Tenderness: There is no right CVA tenderness or  left CVA tenderness.  Musculoskeletal:        General: Normal range of motion.  Skin:    General: Skin is warm and dry.  Neurological:     General: No focal deficit present.     Mental Status: She is alert and oriented to person, place, and time.  Psychiatric:        Mood and Affect: Mood normal.        Behavior: Behavior normal.        Thought Content: Thought content normal.        Judgment: Judgment normal.        Assessment & Plan:  1. Frequent urination  - POC Urinalysis Dipstick- negative - Likely from increased fluids and OAB - She can cut back on fluids   2. Acute non-recurrent maxillary sinusitis  - doxycycline (VIBRAMYCIN) 100 MG capsule; Take 1 capsule (100 mg total) by mouth 2 (two) times daily for 7 days.  Dispense: 14 capsule; Refill: 0  Shirline Frees, NP

## 2023-06-30 DIAGNOSIS — M533 Sacrococcygeal disorders, not elsewhere classified: Secondary | ICD-10-CM | POA: Diagnosis not present

## 2023-07-06 NOTE — Progress Notes (Signed)
Anesthesia Review:  PCP: Shirline Frees , NP- Telephhone- Surgery clearance LOV 06/29/23 for sunisitis  Cardiologist : Chest x-ray : EKG : Echo : Stress test: Cardiac Cath :  Activity level:  Sleep Study/ CPAP : Fasting Blood Sugar :      / Checks Blood Sugar -- times a day:   Blood Thinner/ Instructions /Last Dose: ASA / Instructions/ Last Dose :    81 mg aspirin    PreDM -

## 2023-07-06 NOTE — Patient Instructions (Signed)
SURGICAL WAITING ROOM VISITATION  Patients having surgery or a procedure may have no more than 2 support people in the waiting area - these visitors may rotate.    Children under the age of 26 must have an adult with them who is not the patient.  Due to an increase in RSV and influenza rates and associated hospitalizations, children ages 56 and under may not visit patients in [facility] hospitals.  If the patient needs to stay at the hospital during part of their recovery, the visitor guidelines for inpatient rooms apply. Pre-op nurse will coordinate an appropriate time for 1 support person to accompany patient in pre-op.  This support person may not rotate.    Please refer to the [facility] website for the visitor guidelines for Inpatients (after your surgery is over and you are in a regular room).       Your procedure is scheduled on:  07/20/23    Report to [facility] Main Entrance    Report to admitting at  [phone]   Call this number if you have problems the morning of surgery [phone]   Do not eat food :After Midnight.   After Midnight you may have the following liquids until __ 0430____ AM  DAY OF SURGERY  Water Non-Citrus Juices (without pulp, NO RED-Apple, White grape, White cranberry) Black Coffee (NO MILK/CREAM OR CREAMERS, sugar ok)  Clear Tea (NO MILK/CREAM OR CREAMERS, sugar ok) regular and decaf                             Plain Jell-O (NO RED)                                           Fruit ices (not with fruit pulp, NO RED)                                     Popsicles (NO RED)                                                               Sports drinks like Gatorade (NO RED)                    The day of surgery:  Drink ONE (1) Pre-Surgery Clear Ensure or G2 at  0430AM  ( have completed by ) the morning of surgery. Drink in one sitting. Do not sip.  This drink was given to you during your hospital  pre-op appointment visit. Nothing else to drink  after completing the  Pre-Surgery Clear Ensure or G2.          If you have questions, please contact your surgeon's office.      Oral Hygiene is also important to reduce your risk of infection.                                    Remember - BRUSH YOUR TEETH THE MORNING OF SURGERY WITH YOUR REGULAR  TOOTHPASTE  DENTURES WILL BE REMOVED PRIOR TO SURGERY PLEASE DO NOT APPLY "Poly grip" OR ADHESIVES!!!   Do NOT smoke after Midnight   Stop all vitamins and herbal supplements 7 days before surgery.   Take these medicines the morning of surgery with A SIP OF WATER:   cymbalta, flonase  DO NOT TAKE ANY ORAL DIABETIC MEDICATIONS DAY OF YOUR SURGERY  Bring CPAP mask and tubing day of surgery.                              You may not have any metal on your body including hair pins, jewelry, and body piercing             Do not wear make-up, lotions, powders, perfumes/cologne, or deodorant  Do not wear nail polish including gel and S&S, artificial/acrylic nails, or any other type of covering on natural nails including finger and toenails. If you have artificial nails, gel coating, etc. that needs to be removed by a nail salon please have this removed prior to surgery or surgery may need to be canceled/ delayed if the surgeon/ anesthesia feels like they are unable to be safely monitored.   Do not shave  48 hours prior to surgery.               Men may shave face and neck.   Do not bring valuables to the hospital. [facility] IS NOT             RESPONSIBLE   FOR VALUABLES.   Contacts, glasses, dentures or bridgework may not be worn into surgery.   Bring small overnight bag day of surgery.   DO NOT BRING YOUR HOME MEDICATIONS TO THE HOSPITAL. PHARMACY WILL DISPENSE MEDICATIONS LISTED ON YOUR MEDICATION LIST TO YOU DURING YOUR ADMISSION IN THE HOSPITAL!    Patients discharged on the day of surgery will not be allowed to drive home.  Someone NEEDS to stay with you for the first 24 hours after  anesthesia.   Special Instructions: Bring a copy of your healthcare power of attorney and living will documents the day of surgery if you haven't scanned them before.              Please read over the following fact sheets you were given: IF YOU HAVE QUESTIONS ABOUT YOUR PRE-OP INSTRUCTIONS PLEASE CALL [phone]   If you received a COVID test during your pre-op visit  it is requested that you wear a mask when out in public, stay away from anyone that may not be feeling well and notify your surgeon if you develop symptoms. If you test positive for Covid or have been in contact with anyone that has tested positive in the last 10 days please notify you surgeon.      Pre-operative 5 CHG Bath Instructions   You can play a key role in reducing the risk of infection after surgery. Your skin needs to be as free of germs as possible. You can reduce the number of germs on your skin by washing with CHG (chlorhexidine gluconate) soap before surgery. CHG is an antiseptic soap that kills germs and continues to kill germs even after washing.   DO NOT use if you have an allergy to chlorhexidine/CHG or antibacterial soaps. If your skin becomes reddened or irritated, stop using the CHG and notify one of our RNs at [phone].   Please shower with the CHG soap starting 4  days before surgery using the following schedule:     Please keep in mind the following:  DO NOT shave, including legs and underarms, starting the day of your first shower.   You may shave your face at any point before/day of surgery.  Place clean sheets on your bed the day you start using CHG soap. Use a clean washcloth (not used since being washed) for each shower. DO NOT sleep with pets once you start using the CHG.   CHG Shower Instructions:  If you choose to wash your hair and private area, wash first with your normal shampoo/soap.  After you use shampoo/soap, rinse your hair and body thoroughly to remove shampoo/soap residue.   Turn the water OFF and apply about 3 tablespoons (45 ml) of CHG soap to a CLEAN washcloth.  Apply CHG soap ONLY FROM YOUR NECK DOWN TO YOUR TOES (washing for 3-5 minutes)  DO NOT use CHG soap on face, private areas, open wounds, or sores.  Pay special attention to the area where your surgery is being performed.  If you are having back surgery, having someone wash your back for you may be helpful. Wait 2 minutes after CHG soap is applied, then you may rinse off the CHG soap.  Pat dry with a clean towel  Put on clean clothes/pajamas   If you choose to wear lotion, please use ONLY the CHG-compatible lotions on the back of this paper.     Additional instructions for the day of surgery: DO NOT APPLY any lotions, deodorants, cologne, or perfumes.   Put on clean/comfortable clothes.  Brush your teeth.  Ask your nurse before applying any prescription medications to the skin.      CHG Compatible Lotions   Aveeno Moisturizing lotion  Cetaphil Moisturizing Cream  Cetaphil Moisturizing Lotion  Clairol Herbal Essence Moisturizing Lotion, Dry Skin  Clairol Herbal Essence Moisturizing Lotion, Extra Dry Skin  Clairol Herbal Essence Moisturizing Lotion, Normal Skin  Curel Age Defying Therapeutic Moisturizing Lotion with Alpha Hydroxy  Curel Extreme Care Body Lotion  Curel Soothing Hands Moisturizing Hand Lotion  Curel Therapeutic Moisturizing Cream, Fragrance-Free  Curel Therapeutic Moisturizing Lotion, Fragrance-Free  Curel Therapeutic Moisturizing Lotion, Original Formula  Eucerin Daily Replenishing Lotion  Eucerin Dry Skin Therapy Plus Alpha Hydroxy Crme  Eucerin Dry Skin Therapy Plus Alpha Hydroxy Lotion  Eucerin Original Crme  Eucerin Original Lotion  Eucerin Plus Crme Eucerin Plus Lotion  Eucerin TriLipid Replenishing Lotion  Keri Anti-Bacterial Hand Lotion  Keri Deep Conditioning Original Lotion Dry Skin Formula Softly Scented  Keri Deep Conditioning Original Lotion, Fragrance  Free Sensitive Skin Formula  Keri Lotion Fast Absorbing Fragrance Free Sensitive Skin Formula  Keri Lotion Fast Absorbing Softly Scented Dry Skin Formula  Keri Original Lotion  Keri Skin Renewal Lotion Keri Silky Smooth Lotion  Keri Silky Smooth Sensitive Skin Lotion  Nivea Body Creamy Conditioning Oil  Nivea Body Extra Enriched Teacher, adult education Moisturizing Lotion Nivea Crme  Nivea Skin Firming Lotion  NutraDerm 30 Skin Lotion  NutraDerm Skin Lotion  NutraDerm Therapeutic Skin Cream  NutraDerm Therapeutic Skin Lotion  ProShield Protective Hand Cream  Provon moisturizing lotion

## 2023-07-07 ENCOUNTER — Ambulatory Visit (INDEPENDENT_AMBULATORY_CARE_PROVIDER_SITE_OTHER): Payer: 59 | Admitting: Adult Health

## 2023-07-07 ENCOUNTER — Telehealth: Payer: Self-pay | Admitting: Adult Health

## 2023-07-07 ENCOUNTER — Encounter: Payer: Self-pay | Admitting: Adult Health

## 2023-07-07 VITALS — BP 120/70 | HR 72 | Temp 98.0°F | Ht 63.0 in | Wt 147.0 lb

## 2023-07-07 DIAGNOSIS — H8113 Benign paroxysmal vertigo, bilateral: Secondary | ICD-10-CM | POA: Diagnosis not present

## 2023-07-07 MED ORDER — MECLIZINE HCL 25 MG PO TABS
25.0000 mg | ORAL_TABLET | Freq: Three times a day (TID) | ORAL | 1 refills | Status: DC | PRN
Start: 2023-07-07 — End: 2023-09-28

## 2023-07-07 NOTE — Telephone Encounter (Signed)
Patient notified of update  and verbalized understanding. 

## 2023-07-07 NOTE — Telephone Encounter (Signed)
Pt was just seen today by NP.  Pt states NP told her she has Vertigo.  Pt says she spoke to her cousin, who also has Vertigo, and she told her she had to go to an ENT to treat Vertigo.  Pt is wondering if she should do the same?  Please advise.

## 2023-07-07 NOTE — Progress Notes (Signed)
Subjective:    Patient ID: Cathy [name], female    DOB: 1946-05-09, 77 y.o.   MRN: [id number]  Dizziness    77 year old female who  has a past medical history of Chronic constipation, DJD (degenerative joint disease), Female cystocele, GERD (gastroesophageal reflux disease), History of esophagitis, History of left inguinal hernia, History of MRSA infection, History of recurrent UTIs, Hyperlipidemia, Hypertension, Pre-diabetes, and Urgency of urination.  She presents to the office today for for an acute issue. She reports that about 4 days ago she started to have " dizziness". She reports that over the last four days she has had two instances where she would get out of bed and she felt like the world was spinning around here. When it first happened she had to hold onto the wall to get into the bathroom. She denies nausea or vomiting. The second time was this morning when she got out of bed. Symptoms last a few minutes and the resolve for the day.     Review of Systems  Neurological:  Positive for dizziness.   See HPI   Past Medical History:  Diagnosis Date   Chronic constipation    DJD (degenerative joint disease)    knees   Female cystocele    GERD (gastroesophageal reflux disease)    History of esophagitis    History of left inguinal hernia    History of MRSA infection    recurrent carbuncle   History of recurrent UTIs    Hyperlipidemia    Hypertension    Pre-diabetes    diet controlled   Urgency of urination     Social History   Socioeconomic History   Marital status: Single    Spouse name: Not on file   Number of children: Not on file   Years of education: Not on file   Highest education level: Not on file  Occupational History   Not on file  Tobacco Use   Smoking status: Former    Current packs/day: 0.00    Types: Cigarettes    Start date: 02/28/1995    Quit date: 02/28/1996    Years since quitting: 27.3   Smokeless tobacco: Never  Vaping Use   Vaping  status: Never Used  Substance and Sexual Activity   Alcohol use: Yes    Alcohol/week: 4.0 standard drinks of alcohol    Types: 4 Standard drinks or equivalent per week    Comment: on weekends   Drug use: No   Sexual activity: Yes    Birth control/protection: Surgical  Other Topics Concern   Not on file  Social History Narrative   Single   Former Smoker  -  quit 10 to 11 years ago (light smoker)   Alcohol use-yes     2 children    Occupation: [employer]    Social Determinants of Health   Financial Resource Strain: Low Risk  (07/14/2022)   Overall Financial Resource Strain (CARDIA)    Difficulty of Paying Living Expenses: Not hard at all  Food Insecurity: No Food Insecurity (07/14/2022)   Hunger Vital Sign    Worried About Running Out of Food in the Last Year: Never true    Ran Out of Food in the Last Year: Never true  Transportation Needs: No Transportation Needs (07/14/2022)   PRAPARE - Transportation    Lack of Transportation (Medical): No    Lack of Transportation (Non-Medical): No  Physical Activity: Sufficiently Active (07/14/2022)   Exercise  Vital Sign    Days of Exercise per Week: 3 days    Minutes of Exercise per Session: 70 min  Stress: No Stress Concern Present (07/14/2022)   [employer] of Occupational Health - Occupational Stress Questionnaire    Feeling of Stress : Not at all  Social Connections: Moderately Integrated (07/14/2022)   Social Connection and Isolation Panel [NHANES]    Frequency of Communication with Friends and Family: More than three times a week    Frequency of Social Gatherings with Friends and Family: More than three times a week    Attends Religious Services: More than 4 times per year    Active Member of Clubs or Organizations: Yes    Attends Banker Meetings: More than 4 times per year    Marital Status: Never married  Intimate Partner Violence: Not At Risk (07/14/2022)   Humiliation, Afraid, Rape, and Kick  questionnaire    Fear of Current or Ex-Partner: No    Emotionally Abused: No    Physically Abused: No    Sexually Abused: No    Past Surgical History:  Procedure Laterality Date   APPENDECTOMY  1980's   BREAST EXCISIONAL BIOPSY Left 1989   COLONOSCOPY  last one 06-18-2015   INGUINAL HERNIA REPAIR Left 05-10-2001   LAPAROSCOPIC LYSIS OF ADHESIONS  01/17/2019   OOPHORECTOMY Right 01/17/2019   PERINEOPLASTY  01/17/2019   SALPINGECTOMY Left 01/17/2019   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 SACROSPINUS FIXATION, AUGMENTATION WITH AXIS DERMIS;  Surgeon: Jethro Bolus, MD;  Location: [facility];  Service: Urology;  Laterality: N/A;    Family History  Problem Relation Age of Onset   Aneurysm Mother 39       deceased secondary to brain aneurysm   Liver cancer Father 3       deceased   Diabetes Other        grandmother   Colon cancer Neg Hx    Stomach cancer Neg Hx    Rectal cancer Neg Hx    Esophageal cancer Neg Hx    Breast cancer Neg Hx     Allergies  Allergen Reactions   Flexeril [Cyclobenzaprine]     Hallucinations    Penicillins Hives    Has patient had a PCN reaction causing immediate rash, facial/tongue/throat swelling, SOB or lightheadedness with hypotension: Yes Has patient had a PCN reaction causing severe rash involving mucus membranes or skin necrosis: No Has patient had a PCN reaction that required hospitalization: No Has patient had a PCN reaction occurring within the  last 10 years: No If all of the above answers are "NO", then may proceed with Cephalosporin use.     Current Outpatient Medications on File Prior to Visit  Medication Sig Dispense Refill   Ascorbic Acid (VITAMIN C) 1000 MG tablet Take 1,000 mg by mouth daily.     aspirin 81 MG tablet Take 1 tablet (81 mg total) by mouth 2 (two) times daily after a meal. (Patient taking differently: Take 81 mg by mouth daily.) 60 tablet  0   b complex vitamins capsule Take 1 capsule by mouth daily.     benzonatate (TESSALON) 100 MG capsule Take 1 capsule (100 mg total) by mouth every 8 (eight) hours. 21 capsule 0   Biotin 1000 MCG tablet Take 1,000 mcg by mouth daily.     Blood Glucose Monitoring Suppl (ONETOUCH VERIO FLEX SYSTEM) w/Device KIT TEST TWICE DAILY 1 kit 0   Calcium Carbonate-Vitamin D 600-400 MG-UNIT tablet Take 1 tablet by mouth daily.     Chromium 1000 MCG TABS Take 1,000 mcg by mouth daily.     Cinnamon 500 MG capsule Take 1,000 mg by mouth daily.     CRANBERRY PO Take 2 tablets by mouth daily. 4200 mg     Cyanocobalamin (VITAMIN B-12) 2500 MCG SUBL Place 2,500 mcg under the tongue daily.     DULoxetine (CYMBALTA) 20 MG capsule Take 1 capsule (20 mg total) by mouth daily. 90 capsule 0   ELDERBERRY PO Take 1 capsule by mouth daily.     estradiol (ESTRACE) 0.1 MG/GM vaginal cream Place 1 Applicatorful vaginally every other day. In the evening  3   fluticasone (FLONASE) 50 MCG/ACT nasal spray SHAKE LIQUID AND USE 2 SPRAYS IN EACH NOSTRIL DAILY 48 g 2   fluticasone (FLONASE) 50 MCG/ACT nasal spray Place 2 sprays into both nostrils daily. 16 g 6   glucose blood (ONETOUCH VERIO) test strip 1 each by Other route 2 (two) times daily. Use as instructed 200 strip 3   Lancets (ONETOUCH DELICA PLUS LANCET33G) MISC USE ONCE DAILY 100 each 3   lisinopril (ZESTRIL) 10 MG tablet TAKE 1 TABLET(10 MG) BY MOUTH DAILY 90 tablet 3   meloxicam (MOBIC) 15 MG tablet Take 15 mg by mouth daily.     Misc Natural  Products (TART CHERRY ADVANCED) CAPS Take 2 capsules by mouth daily.     Multiple Vitamins-Minerals (MULTIVITAMIN WITH MINERALS) tablet Take 1 tablet by mouth daily. Centrum silver     OVER THE COUNTER MEDICATION Take 1 tablet by mouth daily as needed (constipation). Vital Lax     polyethylene glycol (MIRALAX / GLYCOLAX) packet Take 17 g by mouth daily as needed for mild constipation.     rosuvastatin (CRESTOR) 40 MG tablet Take 1 tablet (40 mg total) by mouth daily. 90 tablet 3   tiZANidine (ZANAFLEX) 2 MG tablet Take 1 tablet (2 mg total) by mouth at bedtime. 30 tablet 0   Turmeric 500 MG CAPS Take 1,000 mg by mouth daily.      MYRBETRIQ 25 MG TB24 tablet Take 25 mg by mouth at bedtime.     [DISCONTINUED] valsartan (DIOVAN) 160 MG tablet Take 1/2 tablet by mouth once daily 30 tablet 5   No current facility-administered medications on file prior to visit.    BP 120/70   Pulse 72   Temp 98 F (36.7 C) (Oral)   Ht 5\' 3"  (1.6 m)   Wt 147 lb (66.7 kg)   SpO2 97%   BMI 26.04 kg/m       Objective:   Physical Exam Vitals and nursing note reviewed.  Constitutional:      Appearance: Normal appearance.  HENT:     Nose: Congestion present. No rhinorrhea.     Right Sinus: No maxillary sinus tenderness or frontal sinus tenderness.     Left Sinus: No maxillary sinus tenderness or frontal sinus tenderness.  Eyes:     Extraocular Movements:     Right eye: Nystagmus present.  Left eye: Nystagmus present.     Comments: Horizontal   Cardiovascular:     Rate and Rhythm: Normal rate and regular rhythm.     Pulses: Normal pulses.     Heart sounds: Normal heart sounds.  Pulmonary:     Effort: Pulmonary effort is normal.     Breath sounds: Normal breath sounds.  Musculoskeletal:        General: Normal range of motion.  Skin:    General: Skin is warm and dry.  Neurological:     General: No focal deficit present.     Mental Status: She is alert and oriented to person, place, and time.      Comments: Symptomatic on exam table   Psychiatric:        Mood and Affect: Mood normal.        Behavior: Behavior normal.        Thought Content: Thought content normal.        Judgment: Judgment normal.        Assessment & Plan:  1. Benign paroxysmal positional vertigo due to bilateral vestibular disorder - Symptoms seem to be related to vertigo.  - will send in Antivert. She is aware of sedating effects of this medication  - Follow up if not improved/resolved in the next few days  - Can consider vestibular PT  - meclizine (ANTIVERT) 25 MG tablet; Take 1 tablet (25 mg total) by mouth 3 (three) times daily as needed for dizziness.  Dispense: 30 tablet; Refill: 1  Shirline Frees, NP

## 2023-07-07 NOTE — Telephone Encounter (Signed)
Ok for referral?

## 2023-07-08 ENCOUNTER — Encounter (HOSPITAL_COMMUNITY): Payer: Self-pay

## 2023-07-08 ENCOUNTER — Other Ambulatory Visit: Payer: Self-pay

## 2023-07-08 ENCOUNTER — Encounter (HOSPITAL_COMMUNITY)
Admission: RE | Admit: 2023-07-08 | Discharge: 2023-07-08 | Disposition: A | Discharge status: Home / Self Care | Payer: 59 | Source: Ambulatory Visit | Attending: Orthopaedic Surgery | Admitting: Orthopaedic Surgery

## 2023-07-08 ENCOUNTER — Ambulatory Visit (HOSPITAL_COMMUNITY)
Admission: RE | Admit: 2023-07-08 | Discharge: 2023-07-08 | Disposition: A | Discharge status: Home / Self Care | Payer: 59 | Source: Ambulatory Visit | Attending: Orthopaedic Surgery | Admitting: Orthopaedic Surgery

## 2023-07-08 VITALS — BP 124/74 | HR 72 | Temp 98.6°F | Resp 16 | Ht 64.0 in | Wt 143.0 lb

## 2023-07-08 DIAGNOSIS — Z01818 Encounter for other preprocedural examination: Secondary | ICD-10-CM | POA: Diagnosis not present

## 2023-07-08 DIAGNOSIS — I493 Ventricular premature depolarization: Secondary | ICD-10-CM | POA: Insufficient documentation

## 2023-07-08 LAB — BASIC METABOLIC PANEL
Anion gap: 7 (ref 5–15)
BUN: 15 mg/dL (ref 8–23)
CO2: 27 mmol/L (ref 22–32)
Calcium: 9.2 mg/dL (ref 8.9–10.3)
Chloride: 100 mmol/L (ref 98–111)
Creatinine, Ser: 0.8 mg/dL (ref 0.44–1.00)
GFR, Estimated: >60 mL/min (ref >60)
Glucose, Bld: 93 mg/dL (ref 70–99)
Potassium: 4 mmol/L (ref 3.5–5.1)
Sodium: 134 mmol/L — ABNORMAL LOW (ref 135–145)

## 2023-07-08 LAB — SURGICAL PCR SCREEN
MRSA, PCR: NEGATIVE
Staphylococcus aureus: NEGATIVE

## 2023-07-08 LAB — CBC
HCT: 43.8 % (ref 36.0–46.0)
Hemoglobin: 13.8 g/dL (ref 12.0–15.0)
MCH: 25 pg — ABNORMAL LOW (ref 26.0–34.0)
MCHC: 31.5 g/dL (ref 30.0–36.0)
MCV: 79.5 fL — ABNORMAL LOW (ref 80.0–100.0)
Platelets: 285 10*3/uL (ref 150–400)
RBC: 5.51 MIL/uL — ABNORMAL HIGH (ref 3.87–5.11)
RDW: 15.2 % (ref 11.5–15.5)
WBC: 9.9 10*3/uL (ref 4.0–10.5)
nRBC: 0 % (ref 0.0–0.2)

## 2023-07-13 NOTE — H&P (Signed)
TOTAL KNEE ADMISSION H&P  Patient is being admitted for left total knee arthroplasty.  Subjective:  Chief Complaint:left knee pain.  HPI: Cathy [name], 77 y.o. female, has a history of pain and functional disability in the left knee due to arthritis and has failed non-surgical conservative treatments for greater than 12 weeks to includeNSAID's and/or analgesics, corticosteriod injections, viscosupplementation injections, flexibility and strengthening excercises, supervised PT with diminished ADL's post treatment, use of assistive devices, weight reduction as appropriate, and activity modification.  Onset of symptoms was gradual, starting 5 years ago with gradually worsening course since that time. The patient noted no past surgery on the left knee(s).  Patient currently rates pain in the left knee(s) at 10 out of 10 with activity. Patient has night pain, worsening of pain with activity and weight bearing, pain that interferes with activities of daily living, crepitus, and joint swelling.  Patient has evidence of subchondral cysts, subchondral sclerosis, periarticular osteophytes, and joint space narrowing by imaging studies. There is no active infection.  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 06/18/2021   Primary localized osteoarthritis of left hip 01/28/2021   Primary osteoarthritis of left hip 01/28/2021   Primary localized osteoarthritis of right hip 04/09/2020   Primary osteoarthritis of right hip 04/09/2020   Arthritis of right hip 02/19/2020   Greater trochanteric bursitis of both hips 12/19/2019   Greater trochanteric bursitis of left hip 10/03/2019   Arthritis of left hip 10/03/2019   Left groin pain 03/01/2019   Right shoulder pain 12/27/2018   Chronic bilateral low back pain with right-sided sciatica 01/14/2018   Urinary frequency 01/14/2018   Degenerative  arthritis of knee, bilateral 10/21/2017   Arthritis of knee, left 08/03/2017   GERD (gastroesophageal reflux disease) 03/27/2015   Preventative health care 01/04/2015   Constipation 07/15/2012   Allergic rhinitis 04/01/2012   Diabetes mellitus type 2, controlled (HCC) 10/14/2007   Carpal tunnel syndrome 10/14/2007   Dyslipidemia 10/13/2007   Essential hypertension 10/13/2007   Depression, recurrent (HCC) 10/13/2007   Past Medical History:  Diagnosis Date   Chronic constipation    DJD (degenerative joint disease)    knees   Female cystocele    GERD (gastroesophageal reflux disease)    History of esophagitis    History of left inguinal hernia    History of MRSA infection    recurrent carbuncle   History of recurrent UTIs    Hyperlipidemia    Hypertension    Pre-diabetes    diet controlled   Urgency of urination     Past Surgical History:  Procedure Laterality Date   APPENDECTOMY  1980's   BREAST EXCISIONAL BIOPSY Left 1989   COLONOSCOPY  last one 06-18-2015   INGUINAL HERNIA REPAIR Left 05-10-2001   LAPAROSCOPIC LYSIS OF ADHESIONS  01/17/2019   OOPHORECTOMY Right 01/17/2019   PERINEOPLASTY  01/17/2019   SALPINGECTOMY Left 01/17/2019   TOTAL HIP ARTHROPLASTY Right 04/09/2020   Procedure: RIGH TOTAL HIP ARTHROPLASTY ANTERIOR APPROACH;  Surgeon: Marcene Corning, MD;  Location: [facility];  Service: Orthopedics;  Laterality: Right;   TOTAL HIP ARTHROPLASTY Left 01/28/2021   Procedure: LEFT TOTAL HIP ARTHROPLASTY ANTERIOR APPROACH;  Surgeon: Marcene Corning, MD;  Location: [facility];  Service: Orthopedics;  Laterality: Left;   VAGINAL HYSTERECTOMY  1980's   VAGINAL PROLAPSE REPAIR N/A 03/02/2016   Procedure: COLOPLAST ANTERIOR  VAULT REPAIR WITH  AXIS DERMIS. SACROSPINUS FIXATION, AUGMENTATION WITH AXIS DERMIS;  Surgeon: Jethro Bolus, MD;  Location[facility];  Service: Urology;  Laterality: N/A;    No current facility-administered medications for this encounter.    Current Outpatient Medications  Medication Sig Dispense Refill Last Dose   Ascorbic Acid (VITAMIN C) 1000 MG tablet Take 1,000 mg by mouth daily.      aspirin 81 MG tablet Take 1 tablet (81 mg total) by mouth 2 (two) times daily after a meal. (Patient taking differently: Take 81 mg by mouth daily.) 60 tablet 0    b complex vitamins capsule Take 1 capsule by mouth daily.      benzonatate (TESSALON) 100 MG capsule Take 1 capsule (100 mg total) by mouth every 8 (eight) hours. 21 capsule 0    Biotin 1000 MCG tablet Take 1,000 mcg by mouth daily.      Blood Glucose Monitoring Suppl (ONETOUCH VERIO FLEX SYSTEM) w/Device KIT TEST TWICE DAILY 1 kit 0    Calcium Carbonate-Vitamin D 600-400 MG-UNIT tablet Take 1 tablet by mouth daily.      Chromium 1000 MCG TABS Take 1,000 mcg by mouth daily.      Cinnamon 500 MG capsule Take 1,000 mg by mouth daily.      CRANBERRY PO Take 2 tablets by mouth daily. 4200 mg      Cyanocobalamin (VITAMIN B-12) 2500 MCG SUBL Place 2,500 mcg under the tongue daily.      DULoxetine (CYMBALTA) 20 MG capsule Take 1 capsule (20 mg total) by mouth daily. 90 capsule 0    ELDERBERRY PO Take 1 capsule by mouth daily.      estradiol (ESTRACE) 0.1 MG/GM vaginal cream Place 1 Applicatorful vaginally every other day. In the evening  3    fluticasone (FLONASE) 50 MCG/ACT nasal spray Place 2 sprays into both nostrils daily. 16 g 6    glucose blood (ONETOUCH VERIO) test strip 1 each by Other route 2 (two) times daily. Use as instructed 200 strip 3    Lancets (ONETOUCH DELICA PLUS LANCET33G) MISC USE ONCE DAILY 100 each 3    lisinopril (ZESTRIL) 10 MG tablet TAKE 1 TABLET(10 MG) BY MOUTH DAILY 90 tablet 3    meclizine (ANTIVERT) 25 MG tablet Take 1 tablet (25 mg total) by mouth 3 (three) times daily as needed for dizziness. 30 tablet 1    meloxicam (MOBIC) 15 MG tablet Take 15 mg by mouth daily.      Misc Natural Products (TART CHERRY ADVANCED) CAPS Take 2 capsules by mouth daily.       Multiple Vitamins-Minerals (MULTIVITAMIN WITH MINERALS) tablet Take 1 tablet by mouth daily. Centrum silver      MYRBETRIQ 25 MG TB24 tablet Take 25 mg by mouth at bedtime.      OVER THE COUNTER MEDICATION Take 1 tablet by mouth daily as needed (constipation). Vital Lax      polyethylene glycol (MIRALAX / GLYCOLAX) packet Take 17 g by mouth daily as needed for mild constipation.      rosuvastatin (CRESTOR) 40 MG tablet Take 1 tablet (40 mg total) by mouth daily. 90 tablet 3    tiZANidine (ZANAFLEX) 2 MG tablet Take 1 tablet (2 mg total) by mouth at bedtime. 30 tablet 0    Turmeric 500 MG CAPS Take 1,000 mg by mouth daily.       Allergies  Allergen Reactions   Flexeril [Cyclobenzaprine]     Hallucinations    Penicillins Hives  Has patient had a PCN reaction causing immediate rash, facial/tongue/throat swelling, SOB or lightheadedness with hypotension: Yes Has patient had a PCN reaction causing severe rash involving mucus membranes or skin necrosis: No Has patient had a PCN reaction that required hospitalization: No Has patient had a PCN reaction occurring within the last 10 years: No If all of the above answers are "NO", then may proceed with Cephalosporin use.     Social History   Tobacco Use   Smoking status: Former    Current packs/day: 0.00    Types: Cigarettes    Start date: 02/28/1995    Quit date: 02/28/1996    Years since quitting: 27.3   Smokeless tobacco: Never  Substance Use Topics   Alcohol use: Yes    Alcohol/week: 4.0 standard drinks of alcohol    Types: 4 Standard drinks or equivalent per week    Comment: on weekends    Family History  Problem Relation Age of Onset   Aneurysm Mother 22       deceased secondary to brain aneurysm   Liver cancer Father 59       deceased   Diabetes Other        grandmother   Colon cancer Neg Hx    Stomach cancer Neg Hx    Rectal cancer Neg Hx    Esophageal cancer Neg Hx    Breast cancer Neg Hx      Review of Systems   Musculoskeletal:  Positive for arthralgias.       Left knee  All other systems reviewed and are negative.   Objective:  Physical Exam Constitutional:      Appearance: Normal appearance.  HENT:     Head: Normocephalic and atraumatic.     Nose: Nose normal.     Mouth/Throat:     Pharynx: Oropharynx is clear.  Eyes:     Extraocular Movements: Extraocular movements intact.  Pulmonary:     Effort: Pulmonary effort is normal.  Abdominal:     Palpations: Abdomen is soft.  Musculoskeletal:     Cervical back: Normal range of motion.     Comments: Left knee has a moderate valgus deformity.  Motion remains 0-120.  She has lateral joint line pain and crepitation.  Hip motion is good and straight leg raise is negative.  Skin:    General: Skin is warm and dry.  Neurological:     General: No focal deficit present.     Mental Status: She is alert and oriented to person, place, and time.  Psychiatric:        Mood and Affect: Mood normal.        Behavior: Behavior normal.        Thought Content: Thought content normal.        Judgment: Judgment normal.     Vital signs in last 24 hours:    Labs:   Estimated body mass index is 24.55 kg/m as calculated from the following:   Height as of 07/08/23: 5\' 4"  (1.626 m).   Weight as of 07/08/23: 64.9 kg.   Imaging Review Plain radiographs demonstrate severe degenerative joint disease of the left knee(s). The overall alignment isneutral. The bone quality appears to be good for age and reported activity level.      Assessment/Plan:  End stage primary arthritis, left knee   The patient history, physical examination, clinical judgment of the provider and imaging studies are consistent with end stage degenerative joint disease of the left knee(s) and  total knee arthroplasty is deemed medically necessary. The treatment options including medical management, injection therapy arthroscopy and arthroplasty were discussed at length. The risks  and benefits of total knee arthroplasty were presented and reviewed. The risks due to aseptic loosening, infection, stiffness, patella tracking problems, thromboembolic complications and other imponderables were discussed. The patient acknowledged the explanation, agreed to proceed with the plan and consent was signed. Patient is being admitted for inpatient treatment for surgery, pain control, PT, OT, prophylactic antibiotics, VTE prophylaxis, progressive ambulation and ADL's and discharge planning. The patient is planning to be discharged home with home health services   Patient's anticipated LOS is less than 2 midnights, meeting these requirements: - Younger than 64 - Lives within 1 hour of care - Has a competent adult at home to recover with post-op recover - NO history of  - Chronic pain requiring opiods  - Diabetes  - Coronary Artery Disease  - Heart failure  - Heart attack  - Stroke  - DVT/VTE  - Cardiac arrhythmia  - Respiratory Failure/COPD  - Renal failure  - Anemia  - Advanced Liver disease

## 2023-07-14 NOTE — Care Plan (Signed)
Ortho Bundle Case Management Note  Patient Details  Name: Cathy [name] MRN: [id number] Date of Birth: Feb 16, 1946  spoke with patient. she will discharge to home with family to assist. rolling walker ordered for home. OPPT set up with [facility] [address] discharge instructions mailed and discussed. questions answered.  DME Arranged:  Dan Humphreys rolling DME Agency:  [employer]  HH Arranged:    HH Agency:     Additional Comments: Please contact me with any questions of if this plan should need to change.  Shauna Hugh,  RN,BSN,MHA,CCM  [facility]  [phone] 07/14/2023, 4:17 PM

## 2023-07-19 MED ORDER — TRANEXAMIC ACID 1000 MG/10ML IV SOLN
2000.0000 mg | INTRAVENOUS | Status: DC
  Filled 2023-07-19: qty 20

## 2023-07-19 NOTE — Anesthesia Preprocedure Evaluation (Signed)
Anesthesia Evaluation  Patient identified by MRN, date of birth, ID band Patient awake    Reviewed: Allergy & Precautions, NPO status , Patient's Chart, lab work & pertinent test results  History of Anesthesia Complications Negative for: history of anesthetic complications  Airway Mallampati: II  TM Distance: >3 FB Neck ROM: Full    Dental  (+) Dental Advisory Given, Partial Upper   Pulmonary former smoker   breath sounds clear to auscultation       Cardiovascular hypertension, Pt. on medications (-) angina  Rhythm:Regular Rate:Normal     Neuro/Psych    Depression    negative neurological ROS     GI/Hepatic Neg liver ROS,GERD  Controlled,,  Endo/Other  diabetes (diet controlled)    Renal/GU negative Renal ROS     Musculoskeletal  (+) Arthritis ,    Abdominal   Peds  Hematology   Anesthesia Other Findings   Reproductive/Obstetrics                             Anesthesia Physical Anesthesia Plan  ASA: 3  Anesthesia Plan: Spinal   Post-op Pain Management: Tylenol PO (pre-op)* and Regional block*   Induction:   PONV Risk Score and Plan: 2 and Ondansetron and Treatment may vary due to age or medical condition  Airway Management Planned: Natural Airway and Simple Face Mask  Additional Equipment: None  Intra-op Plan:   Post-operative Plan:   Informed Consent: I have reviewed the patients History and Physical, chart, labs and discussed the procedure including the risks, benefits and alternatives for the proposed anesthesia with the patient or authorized representative who has indicated his/her understanding and acceptance.     Dental advisory given  Plan Discussed with: CRNA and Surgeon  Anesthesia Plan Comments: (Plan SAB with adductor canal block for post op analgesia)        Anesthesia Quick Evaluation

## 2023-07-20 ENCOUNTER — Ambulatory Visit (HOSPITAL_COMMUNITY): Payer: 59 | Admitting: Physician Assistant

## 2023-07-20 ENCOUNTER — Encounter (HOSPITAL_COMMUNITY)
Admission: RE | Disposition: A | Discharge status: Home / Self Care | Payer: Self-pay | Source: Ambulatory Visit | Attending: Orthopaedic Surgery

## 2023-07-20 ENCOUNTER — Encounter (HOSPITAL_COMMUNITY): Payer: Self-pay | Admitting: Orthopaedic Surgery

## 2023-07-20 ENCOUNTER — Other Ambulatory Visit: Payer: Self-pay

## 2023-07-20 ENCOUNTER — Ambulatory Visit (HOSPITAL_BASED_OUTPATIENT_CLINIC_OR_DEPARTMENT_OTHER): Payer: 59 | Admitting: Certified Registered Nurse Anesthetist

## 2023-07-20 ENCOUNTER — Ambulatory Visit (HOSPITAL_COMMUNITY)
Admission: RE | Admit: 2023-07-20 | Discharge: 2023-07-20 | Disposition: A | Discharge status: Home / Self Care | Payer: 59 | Source: Ambulatory Visit | Attending: Orthopaedic Surgery | Admitting: Orthopaedic Surgery

## 2023-07-20 DIAGNOSIS — F32A Depression, unspecified: Secondary | ICD-10-CM | POA: Insufficient documentation

## 2023-07-20 DIAGNOSIS — E119 Type 2 diabetes mellitus without complications: Secondary | ICD-10-CM | POA: Insufficient documentation

## 2023-07-20 DIAGNOSIS — M1712 Unilateral primary osteoarthritis, left knee: Secondary | ICD-10-CM | POA: Diagnosis not present

## 2023-07-20 DIAGNOSIS — Z87891 Personal history of nicotine dependence: Secondary | ICD-10-CM

## 2023-07-20 DIAGNOSIS — I10 Essential (primary) hypertension: Secondary | ICD-10-CM

## 2023-07-20 DIAGNOSIS — Z01818 Encounter for other preprocedural examination: Secondary | ICD-10-CM

## 2023-07-20 DIAGNOSIS — E785 Hyperlipidemia, unspecified: Secondary | ICD-10-CM | POA: Diagnosis not present

## 2023-07-20 DIAGNOSIS — G8918 Other acute postprocedural pain: Secondary | ICD-10-CM | POA: Diagnosis not present

## 2023-07-20 DIAGNOSIS — Z96652 Presence of left artificial knee joint: Secondary | ICD-10-CM | POA: Diagnosis not present

## 2023-07-20 HISTORY — PX: TOTAL KNEE ARTHROPLASTY: SHX125

## 2023-07-20 SURGERY — ARTHROPLASTY, KNEE, TOTAL
Anesthesia: General | Site: Knee | Laterality: Left

## 2023-07-20 MED ORDER — LACTATED RINGERS IV SOLN: INTRAVENOUS | Status: DC

## 2023-07-20 MED ORDER — BUPIVACAINE-EPINEPHRINE 0.5% -1:200000 IJ SOLN
INTRAMUSCULAR | Status: DC | PRN
  Administered 2023-07-20: 30 mL

## 2023-07-20 MED ORDER — OXYCODONE HCL 5 MG/5ML PO SOLN: 5.0000 mg | Freq: Once | ORAL | Status: DC | PRN

## 2023-07-20 MED ORDER — ONDANSETRON HCL 4 MG/2ML IJ SOLN
INTRAMUSCULAR | Status: AC
  Filled 2023-07-20: qty 2

## 2023-07-20 MED ORDER — FENTANYL CITRATE (PF) 100 MCG/2ML IJ SOLN
INTRAMUSCULAR | Status: AC
  Filled 2023-07-20: qty 2

## 2023-07-20 MED ORDER — TRANEXAMIC ACID-NACL 1000-0.7 MG/100ML-% IV SOLN
1000.0000 mg | Freq: Once | INTRAVENOUS | Status: AC
  Administered 2023-07-20: 1000 mg via INTRAVENOUS

## 2023-07-20 MED ORDER — ROPIVACAINE HCL 7.5 MG/ML IJ SOLN
INTRAMUSCULAR | Status: DC | PRN
  Administered 2023-07-20: 20 mL via PERINEURAL

## 2023-07-20 MED ORDER — LACTATED RINGERS IV BOLUS
250.0000 mL | Freq: Once | INTRAVENOUS | Status: AC
  Administered 2023-07-20: 250 mL via INTRAVENOUS

## 2023-07-20 MED ORDER — LACTATED RINGERS IV BOLUS
500.0000 mL | Freq: Once | INTRAVENOUS | Status: AC
  Administered 2023-07-20: 500 mL via INTRAVENOUS

## 2023-07-20 MED ORDER — TRANEXAMIC ACID 1000 MG/10ML IV SOLN
INTRAVENOUS | Status: DC | PRN
  Administered 2023-07-20: 2000 mg via TOPICAL

## 2023-07-20 MED ORDER — BUPIVACAINE IN DEXTROSE 0.75-8.25 % IT SOLN
INTRATHECAL | Status: DC | PRN
  Administered 2023-07-20: 12 mg via INTRATHECAL

## 2023-07-20 MED ORDER — PROPOFOL 10 MG/ML IV BOLUS
INTRAVENOUS | Status: DC | PRN
  Administered 2023-07-20: 240 mg via INTRAVENOUS
  Administered 2023-07-20: 20 mg via INTRAVENOUS

## 2023-07-20 MED ORDER — CEFAZOLIN SODIUM-DEXTROSE 2-4 GM/100ML-% IV SOLN
2.0000 g | Freq: Four times a day (QID) | INTRAVENOUS | Status: DC
  Administered 2023-07-20: 2 g via INTRAVENOUS

## 2023-07-20 MED ORDER — ORAL CARE MOUTH RINSE: 15.0000 mL | Freq: Once | OROMUCOSAL | Status: AC

## 2023-07-20 MED ORDER — CHLORHEXIDINE GLUCONATE 0.12 % MT SOLN
15.0000 mL | Freq: Once | OROMUCOSAL | Status: AC
  Administered 2023-07-20: 15 mL via OROMUCOSAL

## 2023-07-20 MED ORDER — HYDROMORPHONE HCL 1 MG/ML IJ SOLN: 0.2500 mg | INTRAMUSCULAR | Status: DC | PRN

## 2023-07-20 MED ORDER — FENTANYL CITRATE (PF) 100 MCG/2ML IJ SOLN
INTRAMUSCULAR | Status: DC | PRN
  Administered 2023-07-20 (×4): 50 ug via INTRAVENOUS

## 2023-07-20 MED ORDER — METHOCARBAMOL 500 MG IVPB - SIMPLE MED: 500.0000 mg | Freq: Four times a day (QID) | INTRAVENOUS | Status: DC | PRN

## 2023-07-20 MED ORDER — ACETAMINOPHEN 500 MG PO TABS
1000.0000 mg | ORAL_TABLET | Freq: Once | ORAL | Status: AC
  Administered 2023-07-20: 1000 mg via ORAL
  Filled 2023-07-20: qty 2

## 2023-07-20 MED ORDER — SODIUM CHLORIDE 0.9% IV SOLUTION
INTRAVENOUS | Status: DC | PRN
  Administered 2023-07-20: 3000 mL

## 2023-07-20 MED ORDER — TRANEXAMIC ACID-NACL 1000-0.7 MG/100ML-% IV SOLN
1000.0000 mg | INTRAVENOUS | Status: AC
  Administered 2023-07-20: 1000 mg via INTRAVENOUS
  Filled 2023-07-20: qty 100

## 2023-07-20 MED ORDER — TRANEXAMIC ACID-NACL 1000-0.7 MG/100ML-% IV SOLN
INTRAVENOUS | Status: AC
  Filled 2023-07-20: qty 100

## 2023-07-20 MED ORDER — BUPIVACAINE LIPOSOME 1.3 % IJ SUSP
INTRAMUSCULAR | Status: AC
  Filled 2023-07-20: qty 20

## 2023-07-20 MED ORDER — PROPOFOL 1000 MG/100ML IV EMUL
INTRAVENOUS | Status: AC
  Filled 2023-07-20: qty 100

## 2023-07-20 MED ORDER — BUPIVACAINE-EPINEPHRINE (PF) 0.5% -1:200000 IJ SOLN
INTRAMUSCULAR | Status: AC
  Filled 2023-07-20: qty 30

## 2023-07-20 MED ORDER — MIDAZOLAM HCL 2 MG/2ML IJ SOLN: 0.5000 mg | Freq: Once | INTRAMUSCULAR | Status: DC | PRN

## 2023-07-20 MED ORDER — BUPIVACAINE LIPOSOME 1.3 % IJ SUSP
INTRAMUSCULAR | Status: DC | PRN
  Administered 2023-07-20: 20 mL

## 2023-07-20 MED ORDER — METHOCARBAMOL 500 MG PO TABS: 500.0000 mg | ORAL_TABLET | Freq: Four times a day (QID) | ORAL | Status: DC | PRN

## 2023-07-20 MED ORDER — 0.9 % SODIUM CHLORIDE (POUR BTL) OPTIME
TOPICAL | Status: DC | PRN
  Administered 2023-07-20: 1000 mL

## 2023-07-20 MED ORDER — PROMETHAZINE HCL 25 MG/ML IJ SOLN: 6.2500 mg | INTRAMUSCULAR | Status: DC | PRN

## 2023-07-20 MED ORDER — CEFAZOLIN SODIUM-DEXTROSE 2-4 GM/100ML-% IV SOLN
2.0000 g | INTRAVENOUS | Status: AC
  Administered 2023-07-20: 2 g via INTRAVENOUS
  Filled 2023-07-20: qty 100

## 2023-07-20 MED ORDER — ONDANSETRON HCL 4 MG/2ML IJ SOLN
INTRAMUSCULAR | Status: DC | PRN
  Administered 2023-07-20: 4 mg via INTRAVENOUS

## 2023-07-20 MED ORDER — BUPIVACAINE LIPOSOME 1.3 % IJ SUSP: 20.0000 mL | Freq: Once | INTRAMUSCULAR | Status: DC

## 2023-07-20 MED ORDER — DEXAMETHASONE SODIUM PHOSPHATE 10 MG/ML IJ SOLN
INTRAMUSCULAR | Status: DC | PRN
  Administered 2023-07-20: 5 mg via INTRAVENOUS

## 2023-07-20 MED ORDER — POVIDONE-IODINE 10 % EX SWAB: 2.0000 | Freq: Once | CUTANEOUS | Status: DC

## 2023-07-20 MED ORDER — DEXAMETHASONE SODIUM PHOSPHATE 10 MG/ML IJ SOLN
INTRAMUSCULAR | Status: AC
  Filled 2023-07-20: qty 1

## 2023-07-20 MED ORDER — ASPIRIN 81 MG PO TABS: 81.0000 mg | ORAL_TABLET | Freq: Two times a day (BID) | ORAL | 0 refills | Status: AC

## 2023-07-20 MED ORDER — OXYCODONE HCL 5 MG PO TABS: 5.0000 mg | ORAL_TABLET | Freq: Once | ORAL | Status: DC | PRN

## 2023-07-20 MED ORDER — SODIUM CHLORIDE (PF) 0.9 % IJ SOLN
INTRAMUSCULAR | Status: DC | PRN
  Administered 2023-07-20: 30 mL

## 2023-07-20 MED ORDER — CEFAZOLIN SODIUM-DEXTROSE 2-4 GM/100ML-% IV SOLN
INTRAVENOUS | Status: AC
  Filled 2023-07-20: qty 100

## 2023-07-20 SURGICAL SUPPLY — 60 items
ATTUNE PS FEM LT SZ 4 CEM KNEE (Femur) IMPLANT
ATTUNE PSRP INSR SZ4 8 KNEE (Insert) IMPLANT
BAG COUNTER SPONGE SURGICOUNT (BAG) ×1 IMPLANT
BAG DECANTER FOR FLEXI CONT (MISCELLANEOUS) ×1 IMPLANT
BAG SPEC THK2 15X12 ZIP CLS (MISCELLANEOUS) ×1
BAG SPNG CNTER NS LX DISP (BAG) ×1
BAG ZIPLOCK 12X15 (MISCELLANEOUS) ×1 IMPLANT
BASE TIBIAL ROT PLAT SZ 5 KNEE (Knees) IMPLANT
BLADE SAGITTAL 25.0X1.19X90 (BLADE) ×1 IMPLANT
BLADE SAW SGTL 11.0X1.19X90.0M (BLADE) ×1 IMPLANT
BLADE SURG SZ10 CARB STEEL (BLADE) ×1 IMPLANT
BNDG CMPR 6 X 5 YARDS HK CLSR (GAUZE/BANDAGES/DRESSINGS) ×1
BNDG ELASTIC 6INX 5YD STR LF (GAUZE/BANDAGES/DRESSINGS) ×1 IMPLANT
BOOTIES KNEE HIGH SLOAN (MISCELLANEOUS) ×1 IMPLANT
BOWL SMART MIX CTS (DISPOSABLE) ×1 IMPLANT
BSPLAT TIB 5 CMNT ROT PLAT STR (Knees) ×1 IMPLANT
CEMENT HV SMART SET (Cement) ×2 IMPLANT
COVER SURGICAL LIGHT HANDLE (MISCELLANEOUS) ×1 IMPLANT
CUFF TOURN SGL QUICK 34 (TOURNIQUET CUFF) ×1
CUFF TRNQT CYL 34X4.125X (TOURNIQUET CUFF) ×1 IMPLANT
DRAPE TOP 10253 STERILE (DRAPES) ×1 IMPLANT
DRAPE U-SHAPE 47X51 STRL (DRAPES) ×1 IMPLANT
DRSG AQUACEL AG ADV 3.5X10 (GAUZE/BANDAGES/DRESSINGS) ×1 IMPLANT
DURAPREP 26ML APPLICATOR (WOUND CARE) ×2 IMPLANT
ELECT REM PT RETURN 15FT ADLT (MISCELLANEOUS) ×1 IMPLANT
GLOVE BIO SURGEON STRL SZ8 (GLOVE) ×2 IMPLANT
GLOVE BIOGEL PI IND STRL 7.0 (GLOVE) ×1 IMPLANT
GLOVE BIOGEL PI IND STRL 8 (GLOVE) ×2 IMPLANT
GLOVE SURG SYN 7.0 (GLOVE) ×1 IMPLANT
GLOVE SURG SYN 7.0 PF PI (GLOVE) ×1 IMPLANT
GOWN SRG XL LVL 4 BRTHBL STRL (GOWNS) ×1 IMPLANT
GOWN STRL NON-REIN XL LVL4 (GOWNS) ×1
GOWN STRL REUS W/ TWL XL LVL3 (GOWN DISPOSABLE) ×2 IMPLANT
GOWN STRL REUS W/TWL XL LVL3 (GOWN DISPOSABLE) ×2
HANDPIECE INTERPULSE COAX TIP (DISPOSABLE) ×1
HOLDER FOLEY CATH W/STRAP (MISCELLANEOUS) IMPLANT
HOOD PEEL AWAY T7 (MISCELLANEOUS) ×3 IMPLANT
KIT TURNOVER KIT A (KITS) IMPLANT
MANIFOLD NEPTUNE II (INSTRUMENTS) ×1 IMPLANT
NS IRRIG 1000ML POUR BTL (IV SOLUTION) ×1 IMPLANT
PACK TOTAL KNEE CUSTOM (KITS) ×1 IMPLANT
PAD ARMBOARD 7.5X6 YLW CONV (MISCELLANEOUS) ×1 IMPLANT
PATELLA MEDIAL ATTUN 35MM KNEE (Knees) IMPLANT
PIN STEINMAN FIXATION KNEE (PIN) IMPLANT
PROTECTOR NERVE ULNAR (MISCELLANEOUS) ×1 IMPLANT
SET HNDPC FAN SPRY TIP SCT (DISPOSABLE) ×1 IMPLANT
SPIKE FLUID TRANSFER (MISCELLANEOUS) ×2 IMPLANT
STRIP CLOSURE SKIN 1/2X4 (GAUZE/BANDAGES/DRESSINGS) IMPLANT
SUT ETHIBOND NAB CT1 #1 30IN (SUTURE) ×1 IMPLANT
SUT VIC AB 0 CT1 36 (SUTURE) ×1 IMPLANT
SUT VIC AB 2-0 CT1 27 (SUTURE) ×1
SUT VIC AB 2-0 CT1 TAPERPNT 27 (SUTURE) ×1 IMPLANT
SUT VICRYL AB 3-0 FS1 BRD 27IN (SUTURE) ×1 IMPLANT
SUT VLOC 180 0 24IN GS25 (SUTURE) ×1 IMPLANT
TIBIAL BASE ROT PLAT SZ 5 KNEE (Knees) ×1 IMPLANT
TRAY FOLEY MTR SLVR 16FR STAT (SET/KITS/TRAYS/PACK) IMPLANT
TRAY FOLEY SLVR 14FR TEMP STAT (SET/KITS/TRAYS/PACK) IMPLANT
WATER STERILE IRR 1000ML POUR (IV SOLUTION) ×2 IMPLANT
WRAP KNEE MAXI GEL POST OP (GAUZE/BANDAGES/DRESSINGS) ×1 IMPLANT
YANKAUER SUCT BULB TIP NO VENT (SUCTIONS) ×1 IMPLANT

## 2023-07-20 NOTE — Op Note (Signed)
PREOP DIAGNOSIS: DJD LEFT KNEE POSTOP DIAGNOSIS:  same PROCEDURE: LEFT TKR ANESTHESIA: Spinal and MAC ATTENDING SURGEON: [name] ASSISTANT: Elodia Florence PA  INDICATIONS FOR PROCEDURE: Cathy [name] is a 77 y.o. female who has struggled for a long time with pain due to degenerative arthritis of the left knee.  The patient has failed many conservative non-operative measures and at this point has pain which limits the ability to sleep and walk.  The patient is offered total knee replacement.  Informed operative consent was obtained after discussion of possible risks of anesthesia, infection, neurovascular injury, DVT, and death.  The importance of the post-operative rehabilitation protocol to optimize result was stressed extensively with the patient.  SUMMARY OF FINDINGS AND PROCEDURE:  Cathy [name] was taken to the operative suite where under the above anesthesia a left knee replacement was performed.  There were advanced degenerative changes and the bone quality was g00d.  We used the DePuyAttune system and placed size 4 femur, 5 tibia, 35 mm all polyethylene patella, and a size 8 mm spacer.  Elodia Florence PA-C assisted throughout and was invaluable to the completion of the case in that he helped retract and maintain exposure while I placed the components.  He also helped close thereby minimizing OR time.  The patient was admitted for appropriate post-op care to include perioperative antibiotics and mechanical and pharmacologic measures for DVT prophylaxis.  DESCRIPTION OF PROCEDURE:  Cathy [name] was taken to the operative suite where the above anesthesia was applied.  The patient was positioned supine and prepped and draped in normal sterile fashion.  An appropriate time out was performed.  After the administration of kefzol pre-op antibiotic the leg was elevated and exsanguinated and a tourniquet inflated.  A standard longitudinal incision was made on the anterior knee.  Dissection was  carried down to the extensor mechanism.  All appropriate anti-infective measures were used including the pre-operative antibiotic, betadine impregnated drape, and closed hooded exhaust systems for each member of the surgical team.  A medial parapatellar incision was made in the extensor mechanism and the knee cap flipped and the knee flexed.  Some residual meniscal tissues were removed along with any remaining ACL/PCL tissue.  A guide was placed on the tibia and a flat cut was made on it's superior surface.  An intramedullary guide was placed in the femur and was utilized to make anterior and posterior cuts creating an appropriate flexion gap.  A second intramedullary guide was placed in the femur to make a distal cut properly balancing the knee with an extension gap equal to the flexion gap.  The three bones sized to the above mentioned sizes and the appropriate guides were placed and utilized.  A trial reduction was done and the knee easily came to full extension and the patella tracked well on flexion.  The trial components were removed and all bones were cleaned with pulsatile lavage and then dried thoroughly.  Cement was mixed and was pressurized onto the bones followed by placement of the aforementioned components.  Excess cement was trimmed and pressure was held on the components until the cement had hardened.  The tourniquet was deflated and a small amount of bleeding was controlled with cautery and pressure.  The knee was irrigated thoroughly.  The extensor mechanism was re-approximated with #1 ethibond in interrupted fashion.  The knee was flexed and the repair was solid.  The subcutaneous tissues were re-approximated with #0 and #2-0 vicryl and the skin  closed with a subcuticular stitch and steristrips.  A sterile dressing was applied.  Intraoperative fluids, EBL, and tourniquet time can be obtained from anesthesia records.  DISPOSITION:  The patient was taken to recovery room in stable condition and  scheduled to potentially go home same day depending on ability to walk and tolerate liquids.  [name] 07/20/2023, 9:17 AM

## 2023-07-20 NOTE — Anesthesia Procedure Notes (Signed)
Procedure Name: LMA Insertion Date/Time: 07/20/2023 7:50 AM  Performed by: Vanessa [place], CRNAPre-anesthesia Checklist: Emergency Drugs available, Patient identified, Suction available and Patient being monitored Patient Re-evaluated:Patient Re-evaluated prior to induction Oxygen Delivery Method: Circle system utilized Preoxygenation: Pre-oxygenation with 100% oxygen Induction Type: IV induction Ventilation: Mask ventilation without difficulty LMA: LMA inserted LMA Size: 4.0 Number of attempts: 1 Placement Confirmation: positive ETCO2 and breath sounds checked- equal and bilateral Tube secured with: Tape Dental Injury: Teeth and Oropharynx as per pre-operative assessment

## 2023-07-20 NOTE — Anesthesia Procedure Notes (Signed)
Spinal  End time: 07/20/2023 7:34 AM Staffing Performed: anesthesiologist  Anesthesiologist: Jairo Ben, MD Performed by: Jairo Ben, MD Authorized by: Jairo Ben, MD   Preanesthetic Checklist Completed: patient identified, IV checked, site marked, risks and benefits discussed, surgical consent, monitors and equipment checked, pre-op evaluation and timeout performed Spinal Block Patient position: sitting Prep: DuraPrep and site prepped and draped Patient monitoring: blood pressure, continuous pulse ox, cardiac monitor and heart rate Approach: midline Location: L3-4 Injection technique: single-shot Needle Needle type: Pencan and Introducer  Needle gauge: 24 G Needle length: 9 cm Assessment Events: CSF return Additional Notes Pt identified in Operating room.  Monitors applied. Working IV access confirmed. Sterile prep, drape lumbar spine.  1% lido local L 3,4.  #24ga Pencan into clear CSF L 3,4.  12mg  0.75% Bupivacaine with dextrose injected with asp CSF beginning and end of injection.  Patient asymptomatic, VSS, no heme aspirated, tolerated well.  Sandford Craze, MD

## 2023-07-20 NOTE — Evaluation (Signed)
Physical Therapy Evaluation Patient Details Name: Cathy [name] MRN: [id number] DOB: Jul 17, 1946 Today's Date: 07/20/2023  History of Present Illness  Patient is 77 y.o. female s/p L TKA on 07/20/2023 due to failure of conservative measures. Pt PMH includes but is not limited to: DM II, recurrent UTI, HTN, HLD, GERD,  L THA anterior approach on 01/28/2021 and R THA anterior approach on 04/09/2020.  Clinical Impression     Cathy [name] is a 77 y.o. female POD 0 s/p L TKA. Patient reports IND with mobility at baseline. Patient is now limited by functional impairments (see PT problem list below) and requires min guard and cues for transfers and gait with RW. Patient was able to ambulate 50 feet x 2 with RW and min guard and cues for safe walker management. Patient educated on safe sequencing for stair mobility, step navigation with RW, car transfers, fall risk prevention, pain management and use of CP/ice and verbalized safe guarding position for people assisting with mobility. Patient instructed in exercises to facilitate ROM and circulation reviewed with HO provided. Patient will benefit from continued skilled PT interventions to address impairments and progress towards PLOF. Patient has met mobility goals at adequate level for discharge home with family support and HHPT; will continue to follow if pt continues acute stay to progress towards Mod I goals       If plan is discharge home, recommend the following: A little help with walking and/or transfers;A little help with bathing/dressing/bathroom;Assistance with cooking/housework;Assist for transportation;Help with stairs or ramp for entrance   Can travel by private vehicle        Equipment Recommendations Rolling walker (2 wheels) (provided and adjusted at time of eval)  Recommendations for Other Services       Functional Status Assessment Patient has had a recent decline in their functional status and demonstrates the ability to make  significant improvements in function in a reasonable and predictable amount of time.     Precautions / Restrictions        Mobility  Bed Mobility Overal bed mobility: Needs Assistance Bed Mobility: Supine to Sit     Supine to sit: Contact guard     General bed mobility comments: min cues    Transfers Overall transfer level: Needs assistance Equipment used: Rolling walker (2 wheels) Transfers: Sit to/from Stand Sit to Stand: Contact guard assist           General transfer comment: cues for proper UE placement with bed, recliner and commode transfers    Ambulation/Gait Ambulation/Gait assistance: Contact guard assist Gait Distance (Feet): 55 Feet Assistive device: Rolling walker (2 wheels) Gait Pattern/deviations: Step-to pattern, Antalgic Gait velocity: decreased     General Gait Details: limited B foot clearance and L knee flexion in swing phase  Stairs Stairs: Yes Stairs assistance: Contact guard assist Stair Management: Two rails Number of Stairs: 3 General stair comments: cues for safety and proper sequencing with step navigation and verbal instruction provided on use of RW to navigate one step to enter friends home  Wheelchair Mobility     Tilt Bed    Modified Rankin (Stroke Patients Only)       Balance                                             Pertinent Vitals/Pain Pain Assessment Pain Assessment: 0-10 Pain Score: 6  Pain Location: L knee Pain Descriptors / Indicators: Aching, Tightness, Discomfort, Operative site guarding Pain Intervention(s): Limited activity within patient's tolerance, Monitored during session, Premedicated before session, Repositioned, Ice applied (pain is just begining to start)    Home Living Family/patient expects to be discharged to:: Private residence Living Arrangements: Non-relatives/Friends Available Help at Discharge: Family Type of Home: House Home Access: Stairs to enter Entrance  Stairs-Rails: None Secretary/administrator of Steps: 1 to enter [employer] Layout: One level Home Equipment: None Additional Comments: pt lives with son and daughter in law however will transition to a friends home for recovery    Prior Function Prior Level of Function : Independent/Modified Independent             Mobility Comments: IND no AD with all ADLs, self care tasks, IADLs       Extremity/Trunk Assessment        Lower Extremity Assessment Lower Extremity Assessment: LLE deficits/detail LLE Deficits / Details: ankle DF/PF 5/5; SLR < 10 degree lag LLE Sensation: decreased light touch (pt indicated residual abn sensation anterior L proximal and distal LE)    Cervical / Trunk Assessment Cervical / Trunk Assessment: Normal  Communication   Communication Communication: No apparent difficulties  Cognition Arousal: Alert Behavior During Therapy: WFL for tasks assessed/performed Overall Cognitive Status: Within Functional Limits for tasks assessed                                          General Comments      Exercises Total Joint Exercises Ankle Circles/Pumps: AROM, Both, 20 reps Quad Sets: AROM, Left, 5 reps Gluteal Sets: AROM, Left, 5 reps Short Arc Quad: AROM, Left, 5 reps Heel Slides: AROM, Left, 5 reps Hip ABduction/ADduction: AROM, Left, 5 reps Straight Leg Raises: AROM, 5 reps, Left Knee Flexion: AROM, Left, 5 reps, Seated   Assessment/Plan    PT Assessment Patient needs continued PT services  PT Problem List Decreased range of motion;Decreased strength;Decreased activity tolerance;Decreased balance;Decreased mobility;Decreased coordination;Pain       PT Treatment Interventions DME instruction;Gait training;Stair training;Functional mobility training;Therapeutic activities;Therapeutic exercise;Balance training;Neuromuscular re-education;Wheelchair mobility training;Modalities    PT Goals (Current goals can be found in the Care Plan  section)  Acute Rehab PT Goals Patient Stated 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 PT Goal Formulation: With patient Time For Goal Achievement: 08/03/23 Potential to Achieve Goals: Good    Frequency 7X/week     Co-evaluation               AM-PAC PT "6 Clicks" Mobility  Outcome Measure Help needed turning from your back to your side while in a flat bed without using bedrails?: None Help needed moving from lying on your back to sitting on the side of a flat bed without using bedrails?: A Little Help needed moving to and from a bed to a chair (including a wheelchair)?: A Little Help needed standing up from a chair using your arms (e.g., wheelchair or bedside chair)?: A Little Help needed to walk in hospital room?: A Little Help needed climbing 3-5 steps with a railing? : A Little 6 Click Score: 19    End of Session Equipment Utilized During Treatment: Gait belt Activity Tolerance: Patient tolerated treatment well Patient left: in chair;with call bell/phone within reach;with family/visitor present Nurse Communication: Mobility status;Other (comment) (pt readiness for d/c  from PT standpoint) PT Visit Diagnosis: Unsteadiness on feet (R26.81);Other abnormalities of gait and mobility (R26.89);Muscle weakness (generalized) (M62.81);Difficulty in walking, not elsewhere classified (R26.2);Pain Pain - Right/Left: Left Pain - part of body: Leg;Knee    Time: 8119-1478 PT Time Calculation (min) (ACUTE ONLY): 44 min   Charges:   PT Evaluation $PT Eval Low Complexity: 1 Low PT Treatments $Gait Training: 8-22 mins $Therapeutic Exercise: 8-22 mins PT General Charges $$ ACUTE PT VISIT: 1 Visit         Johnny Bridge, PT Acute Rehab   Cathy [name] 07/20/2023, 2:11 PM

## 2023-07-20 NOTE — Anesthesia Procedure Notes (Signed)
Anesthesia Regional Block: Adductor canal block   Pre-Anesthetic Checklist: , timeout performed,  Correct Patient, Correct Site, Correct Laterality,  Correct Procedure, Correct Position, site marked,  Risks and benefits discussed,  Surgical consent,  Pre-op evaluation,  At surgeon's request and post-op pain management  Laterality: Left and Lower  Prep: chloraprep       Needles:  Injection technique: Single-shot  Needle Type: Echogenic Needle     Needle Length: 9cm  Needle Gauge: 21     Additional Needles:   Procedures:,,,, ultrasound used (permanent image in chart),,    Narrative:  Start time: 07/20/2023 6:46 AM End time: 07/20/2023 6:52 AM Injection made incrementally with aspirations every 5 mL.  Performed by: Personally  Anesthesiologist: Jairo Ben, MD  Additional Notes: Pt identified in Holding room.  Monitors applied. Working IV access confirmed. Timeout, Sterile prep L thigh.  #21ga ECHOgenic Arrow block needle into adductor canal with US guidance.  20cc 0.75% Ropivacaine injected incrementally after negative test dose.  Patient asymptomatic, VSS, no heme aspirated, tolerated well.   Sandford Craze, MD

## 2023-07-20 NOTE — Transfer of Care (Signed)
Immediate Anesthesia Transfer of Care Note  Patient: [name]  Procedure(s) Performed: LEFT TOTAL KNEE ARTHROPLASTY (Left: Knee)  Patient Location: PACU  Anesthesia Type:GA combined with regional for post-op pain  Level of Consciousness: awake and patient cooperative  Airway & Oxygen Therapy: Patient Spontanous Breathing and Patient connected to face mask  Post-op Assessment: Report given to RN and Post -op Vital signs reviewed and stable  Post vital signs: Reviewed and stable  Last Vitals:  Vitals Value Taken Time  BP 142/80 07/20/23 0945  Temp 36.6 C 07/20/23 0945  Pulse 81 07/20/23 0946  Resp 11 07/20/23 0946  SpO2 100 % 07/20/23 0946  Vitals shown include unfiled device data.  Last Pain:  Vitals:   07/20/23 0607  TempSrc: Oral  PainSc:       Patients Stated Pain Goal: 4 (07/20/23 0602)  Complications: No notable events documented.

## 2023-07-20 NOTE — Anesthesia Postprocedure Evaluation (Signed)
Anesthesia Post Note  Patient: Cathy [name]  Procedure(s) Performed: LEFT TOTAL KNEE ARTHROPLASTY (Left: Knee)     Patient location during evaluation: Phase II Anesthesia Type: General Level of consciousness: awake and alert, patient cooperative and oriented Pain management: pain level controlled Vital Signs Assessment: post-procedure vital signs reviewed and stable Respiratory status: spontaneous breathing, nonlabored ventilation and respiratory function stable Cardiovascular status: blood pressure returned to baseline and stable Postop Assessment: no apparent nausea or vomiting, spinal receding, patient able to bend at knees, adequate PO intake and able to ambulate Anesthetic complications: no   No notable events documented.  Last Vitals:  Vitals:   07/20/23 1300 07/20/23 1400  BP: 134/71 (!) 150/74  Pulse: 85 88  Resp:    Temp:    SpO2: 96% 98%    Last Pain:  Vitals:   07/20/23 1400  TempSrc:   PainSc: 0-No pain                 Kito Cuffe,E. Brinleigh Tew

## 2023-07-20 NOTE — Interval H&P Note (Signed)
History and Physical Interval Note:  07/20/2023 7:24 AM  Cathy [name]  has presented today for surgery, with the diagnosis of LEFT KNEE DEGENERATIVE JOINT DISEASE.  The various methods of treatment have been discussed with the patient and family. After consideration of risks, benefits and other options for treatment, the patient has consented to  Procedure(s): LEFT TOTAL KNEE ARTHROPLASTY (Left) as a surgical intervention.  The patient's history has been reviewed, patient examined, no change in status, stable for surgery.  I have reviewed the patient's chart and labs.  Questions were answered to the patient's satisfaction.     [name]

## 2023-07-21 ENCOUNTER — Encounter (HOSPITAL_COMMUNITY): Payer: Self-pay | Admitting: Orthopaedic Surgery

## 2023-07-21 ENCOUNTER — Ambulatory Visit: Payer: 59 | Admitting: Family Medicine

## 2023-07-23 DIAGNOSIS — Z96652 Presence of left artificial knee joint: Secondary | ICD-10-CM | POA: Diagnosis not present

## 2023-07-23 DIAGNOSIS — M25662 Stiffness of left knee, not elsewhere classified: Secondary | ICD-10-CM | POA: Diagnosis not present

## 2023-07-28 ENCOUNTER — Telehealth: Payer: Self-pay

## 2023-07-28 NOTE — Telephone Encounter (Signed)
Unsuccessful attempt to reach patient on preferred number listed in notes for scheduled AWV. Left message on voicemail okay to reschedule. 

## 2023-07-29 DIAGNOSIS — Z96652 Presence of left artificial knee joint: Secondary | ICD-10-CM | POA: Diagnosis not present

## 2023-07-29 DIAGNOSIS — M25662 Stiffness of left knee, not elsewhere classified: Secondary | ICD-10-CM | POA: Diagnosis not present

## 2023-07-30 DIAGNOSIS — M1712 Unilateral primary osteoarthritis, left knee: Secondary | ICD-10-CM | POA: Diagnosis not present

## 2023-08-02 DIAGNOSIS — M25662 Stiffness of left knee, not elsewhere classified: Secondary | ICD-10-CM | POA: Diagnosis not present

## 2023-08-02 DIAGNOSIS — Z96652 Presence of left artificial knee joint: Secondary | ICD-10-CM | POA: Diagnosis not present

## 2023-08-04 DIAGNOSIS — Z96652 Presence of left artificial knee joint: Secondary | ICD-10-CM | POA: Diagnosis not present

## 2023-08-04 DIAGNOSIS — M25662 Stiffness of left knee, not elsewhere classified: Secondary | ICD-10-CM | POA: Diagnosis not present

## 2023-08-06 DIAGNOSIS — Z96652 Presence of left artificial knee joint: Secondary | ICD-10-CM | POA: Diagnosis not present

## 2023-08-06 DIAGNOSIS — M25662 Stiffness of left knee, not elsewhere classified: Secondary | ICD-10-CM | POA: Diagnosis not present

## 2023-08-10 ENCOUNTER — Other Ambulatory Visit: Payer: Self-pay | Admitting: Adult Health

## 2023-08-10 DIAGNOSIS — M159 Polyosteoarthritis, unspecified: Secondary | ICD-10-CM

## 2023-08-10 DIAGNOSIS — M25662 Stiffness of left knee, not elsewhere classified: Secondary | ICD-10-CM | POA: Diagnosis not present

## 2023-08-10 DIAGNOSIS — Z96652 Presence of left artificial knee joint: Secondary | ICD-10-CM | POA: Diagnosis not present

## 2023-08-13 DIAGNOSIS — Z96652 Presence of left artificial knee joint: Secondary | ICD-10-CM | POA: Diagnosis not present

## 2023-08-13 DIAGNOSIS — M25662 Stiffness of left knee, not elsewhere classified: Secondary | ICD-10-CM | POA: Diagnosis not present

## 2023-08-16 DIAGNOSIS — M25662 Stiffness of left knee, not elsewhere classified: Secondary | ICD-10-CM | POA: Diagnosis not present

## 2023-08-16 DIAGNOSIS — Z96652 Presence of left artificial knee joint: Secondary | ICD-10-CM | POA: Diagnosis not present

## 2023-08-19 DIAGNOSIS — M25662 Stiffness of left knee, not elsewhere classified: Secondary | ICD-10-CM | POA: Diagnosis not present

## 2023-08-19 DIAGNOSIS — Z96652 Presence of left artificial knee joint: Secondary | ICD-10-CM | POA: Diagnosis not present

## 2023-08-23 DIAGNOSIS — Z96652 Presence of left artificial knee joint: Secondary | ICD-10-CM | POA: Diagnosis not present

## 2023-08-23 DIAGNOSIS — M25662 Stiffness of left knee, not elsewhere classified: Secondary | ICD-10-CM | POA: Diagnosis not present

## 2023-08-25 DIAGNOSIS — M25662 Stiffness of left knee, not elsewhere classified: Secondary | ICD-10-CM | POA: Diagnosis not present

## 2023-08-25 DIAGNOSIS — Z96652 Presence of left artificial knee joint: Secondary | ICD-10-CM | POA: Diagnosis not present

## 2023-08-31 DIAGNOSIS — Z96652 Presence of left artificial knee joint: Secondary | ICD-10-CM | POA: Diagnosis not present

## 2023-08-31 DIAGNOSIS — M25662 Stiffness of left knee, not elsewhere classified: Secondary | ICD-10-CM | POA: Diagnosis not present

## 2023-09-02 DIAGNOSIS — M25662 Stiffness of left knee, not elsewhere classified: Secondary | ICD-10-CM | POA: Diagnosis not present

## 2023-09-02 DIAGNOSIS — Z96652 Presence of left artificial knee joint: Secondary | ICD-10-CM | POA: Diagnosis not present

## 2023-09-06 ENCOUNTER — Ambulatory Visit (INDEPENDENT_AMBULATORY_CARE_PROVIDER_SITE_OTHER): Payer: 59

## 2023-09-06 ENCOUNTER — Encounter (HOSPITAL_BASED_OUTPATIENT_CLINIC_OR_DEPARTMENT_OTHER): Payer: Self-pay

## 2023-09-06 ENCOUNTER — Emergency Department (HOSPITAL_BASED_OUTPATIENT_CLINIC_OR_DEPARTMENT_OTHER)
Admission: EM | Admit: 2023-09-06 | Discharge: 2023-09-06 | Disposition: A | Discharge status: Home / Self Care | Payer: 59 | Attending: Emergency Medicine | Admitting: Emergency Medicine

## 2023-09-06 ENCOUNTER — Other Ambulatory Visit: Payer: Self-pay

## 2023-09-06 VITALS — Ht 63.0 in | Wt 143.0 lb

## 2023-09-06 DIAGNOSIS — R0981 Nasal congestion: Secondary | ICD-10-CM | POA: Diagnosis not present

## 2023-09-06 DIAGNOSIS — Z711 Person with feared health complaint in whom no diagnosis is made: Secondary | ICD-10-CM | POA: Insufficient documentation

## 2023-09-06 DIAGNOSIS — R059 Cough, unspecified: Secondary | ICD-10-CM | POA: Diagnosis not present

## 2023-09-06 DIAGNOSIS — Z20822 Contact with and (suspected) exposure to covid-19: Secondary | ICD-10-CM | POA: Diagnosis not present

## 2023-09-06 DIAGNOSIS — Z0000 Encounter for general adult medical examination without abnormal findings: Secondary | ICD-10-CM | POA: Diagnosis not present

## 2023-09-06 LAB — SARS CORONAVIRUS 2 BY RT PCR: SARS Coronavirus 2 by RT PCR: NEGATIVE

## 2023-09-06 NOTE — ED Provider Notes (Signed)
[facility]  Provider Note  CSN: [id number] Arrival date & time: 09/06/23 0405  History Chief Complaint  Patient presents with   Cough    Cathy [name] is a 77 y.o. female here with fiance for evaluation of cough. She has not had any acute symptoms, but he has had three days of cough and congestion and they are requesting Covid test.    Home Medications Prior to Admission medications   Medication Sig Start Date End Date Taking? Authorizing Provider  Ascorbic Acid (VITAMIN C) 1000 MG tablet Take 1,000 mg by mouth daily.    [provider]  aspirin 81 MG tablet Take 1 tablet (81 mg total) by mouth 2 (two) times daily after a meal. 07/20/23   Elodia Florence, PA-C  b complex vitamins capsule Take 1 capsule by mouth daily.    [provider]  benzonatate (TESSALON) 100 MG capsule Take 1 capsule (100 mg total) by mouth every 8 (eight) hours. 02/19/23   Garrison, [place] N, FNP  Biotin 1000 MCG tablet Take 1,000 mcg by mouth daily.    [provider]  Blood Glucose Monitoring Suppl (ONETOUCH VERIO FLEX SYSTEM) w/Device KIT TEST TWICE DAILY 07/02/21   Nafziger, Kandee Keen, NP  Calcium Carbonate-Vitamin D 600-400 MG-UNIT tablet Take 1 tablet by mouth daily.    [provider]  Chromium 1000 MCG TABS Take 1,000 mcg by mouth daily.    [provider]  Cinnamon 500 MG capsule Take 1,000 mg by mouth daily.    [provider]  CRANBERRY PO Take 2 tablets by mouth daily. 4200 mg    [provider]  Cyanocobalamin (VITAMIN B-12) 2500 MCG SUBL Place 2,500 mcg under the tongue daily.    [provider]  DULoxetine (CYMBALTA) 20 MG capsule TAKE 1 CAPSULE(20 MG) BY MOUTH DAILY 08/10/23   Nafziger, Kandee Keen, NP  ELDERBERRY PO Take 1 capsule by mouth daily.    [provider]  estradiol (ESTRACE) 0.1 MG/GM vaginal cream Place 1 Applicatorful vaginally every other day. In the evening 08/29/18    [provider]  fluticasone (FLONASE) 50 MCG/ACT nasal spray Place 2 sprays into both nostrils daily. 09/29/22   Nafziger, Kandee Keen, NP  glucose blood (ONETOUCH VERIO) test strip 1 each by Other route 2 (two) times daily. Use as instructed 06/29/23   Shirline Frees, NP  Lancets [facility] DELICA PLUS [place]) MISC USE ONCE DAILY 06/29/23   Nafziger, Kandee Keen, NP  lisinopril (ZESTRIL) 10 MG tablet TAKE 1 TABLET(10 MG) BY MOUTH DAILY 09/29/22   Nafziger, Kandee Keen, NP  meclizine (ANTIVERT) 25 MG tablet Take 1 tablet (25 mg total) by mouth 3 (three) times daily as needed for dizziness. 07/07/23   Shirline Frees, NP  Misc Natural Products (TART CHERRY ADVANCED) CAPS Take 2 capsules by mouth daily.    [provider]  Multiple Vitamins-Minerals (MULTIVITAMIN WITH MINERALS) tablet Take 1 tablet by mouth daily. Centrum silver    [provider]  MYRBETRIQ 25 MG TB24 tablet Take 25 mg by mouth at bedtime.    [provider]  OVER THE COUNTER MEDICATION Take 1 tablet by mouth daily as needed (constipation). Vital Lax    [provider]  polyethylene glycol (MIRALAX / GLYCOLAX) packet Take 17 g by mouth daily as needed for mild constipation.    [provider]  rosuvastatin (CRESTOR) 40 MG tablet Take 1 tablet (40 mg total) by mouth daily. 06/29/23   Nafziger, Kandee Keen,  NP  tiZANidine (ZANAFLEX) 2 MG tablet Take 1 tablet (2 mg total) by mouth at bedtime. 09/29/22   Nafziger, Kandee Keen, NP  Turmeric 500 MG CAPS Take 1,000 mg by mouth daily.     [provider]  valsartan (DIOVAN) 160 MG tablet Take 1/2 tablet by mouth once daily 12/18/11 02/12/12  Sandford Craze, NP     Allergies    Flexeril [cyclobenzaprine] and Penicillins   Review of Systems   Review of Systems Please see HPI for pertinent positives and negatives  Physical Exam BP (!) 173/97   Pulse 87   Temp 97.8 F (36.6 C) (Oral)   Resp 15   Ht 5\' 6"  (1.676 m)   Wt 64.9 kg   SpO2 97%   BMI 23.08 kg/m    Physical Exam Vitals and nursing note reviewed.  Constitutional:      Appearance: Normal appearance.  HENT:     Head: Normocephalic and atraumatic.     Nose: Nose normal.     Mouth/Throat:     Mouth: Mucous membranes are moist.  Eyes:     Extraocular Movements: Extraocular movements intact.     Conjunctiva/sclera: Conjunctivae normal.  Cardiovascular:     Rate and Rhythm: Normal rate.  Pulmonary:     Effort: Pulmonary effort is normal.     Breath sounds: Normal breath sounds.  Abdominal:     General: Abdomen is flat.     Palpations: Abdomen is soft.     Tenderness: There is no abdominal tenderness.  Musculoskeletal:        General: No swelling. Normal range of motion.     Cervical back: Neck supple.  Skin:    General: Skin is warm and dry.  Neurological:     General: No focal deficit present.     Mental Status: She is alert.  Psychiatric:        Mood and Affect: Mood normal.     ED Results / Procedures / Treatments   EKG None  Procedures Procedures  Medications Ordered in the ED Medications - No data to display  Initial Impression and Plan  Patient well appearing with benign exam and reassuring vitals. Covid test ordered per her request.   ED Course       MDM Rules/Calculators/A&P Medical Decision Making Problems Addressed: Physically well but worried: self-limited or minor problem  Amount and/or Complexity of Data Reviewed Labs: ordered. Decision-making details documented in ED Course.     Final Clinical Impression(s) / ED Diagnoses Final diagnoses:  Physically well but worried    Rx / DC Orders ED Discharge Orders     None        Pollyann Savoy, MD 09/06/23 [phone]

## 2023-09-06 NOTE — ED Triage Notes (Signed)
Patient arrives with husband. Reports she wants to be tested for COVID. Patient reports she has a cough. Reports her husband has been sick.

## 2023-09-06 NOTE — Patient Instructions (Addendum)
Cathy [name] , Thank you for taking time to come for your Medicare Wellness Visit. I appreciate your ongoing commitment to your health goals. Please review the following plan we discussed and let me know if I can assist you in the future.   Referrals/Orders/Follow-Ups/Clinician Recommendations:   This is a list of the screening recommended for you and due dates:  Health Maintenance  Topic Date Due   Eye exam for diabetics  06/23/2021   COVID-19 Vaccine (4 - 2023-24 season) 07/25/2023   Yearly kidney health urinalysis for diabetes  08/27/2023   Hemoglobin A1C  08/27/2023   Mammogram  05/31/2024   Complete foot exam   06/28/2024   Yearly kidney function blood test for diabetes  07/07/2024   Medicare Annual Wellness Visit  09/05/2024   Colon Cancer Screening  06/17/2025   DTaP/Tdap/Td vaccine (4 - Td or Tdap) 08/08/2032   Pneumonia Vaccine  Completed   Flu Shot  Completed   DEXA scan (bone density measurement)  Completed   Hepatitis C Screening  Completed   Zoster (Shingles) Vaccine  Completed   HPV Vaccine  Aged Out    Advanced directives: (Declined) Advance directive discussed with you today. Even though you declined this today, please call our office should you change your mind, and we can give you the proper paperwork for you to fill out.  Next Medicare Annual Wellness Visit scheduled for next year: Yes

## 2023-09-06 NOTE — Progress Notes (Signed)
Subjective:   Cathy [name] is a 77 y.o. female who presents for Medicare Annual (Subsequent) preventive examination.  Visit Complete: Virtual I connected with  Burney Gauze on 09/06/23 by a audio enabled telemedicine application and verified that I am speaking with the correct person using two identifiers.  Patient Location: Home  Provider Location: Home Office  I discussed the limitations of evaluation and management by telemedicine. The patient expressed understanding and agreed to proceed.  Vital Signs: Because this visit was a virtual/telehealth visit, some criteria may be missing or patient reported. Any vitals not documented were not able to be obtained and vitals that have been documented are patient reported.    Cardiac Risk Factors include: advanced age (>26men, >30 women);diabetes mellitus;hypertension     Objective:    Today's Vitals   09/06/23 0821 09/06/23 0822  Weight: 143 lb (64.9 kg)   Height: 5\' 3"  (1.6 m)   PainSc:  7    Body mass index is 25.33 kg/m.     09/06/2023    8:31 AM 09/06/2023    4:14 AM 07/08/2023    7:50 AM 07/14/2022    8:44 AM 07/16/2021    3:03 PM 01/28/2021   10:15 AM 01/21/2021    8:24 AM  Advanced Directives  Does Patient Have a Medical Advance Directive? No No No No Yes No No  Type of Agricultural consultant;Living will    Copy of Healthcare Power of Attorney in Chart?     No - copy requested    Would patient like information on creating a medical advance directive? No - Patient declined   No - Patient declined  No - Patient declined     Current Medications (verified) Outpatient Encounter Medications as of 09/06/2023  Medication Sig   Ascorbic Acid (VITAMIN C) 1000 MG tablet Take 1,000 mg by mouth daily.   aspirin 81 MG tablet Take 1 tablet (81 mg total) by mouth 2 (two) times daily after a meal.   b complex vitamins capsule Take 1 capsule by mouth daily.   benzonatate (TESSALON) 100 MG capsule  Take 1 capsule (100 mg total) by mouth every 8 (eight) hours.   Biotin 1000 MCG tablet Take 1,000 mcg by mouth daily.   Blood Glucose Monitoring Suppl (ONETOUCH VERIO FLEX SYSTEM) w/Device KIT TEST TWICE DAILY   Calcium Carbonate-Vitamin D 600-400 MG-UNIT tablet Take 1 tablet by mouth daily.   Chromium 1000 MCG TABS Take 1,000 mcg by mouth daily.   Cinnamon 500 MG capsule Take 1,000 mg by mouth daily.   CRANBERRY PO Take 2 tablets by mouth daily. 4200 mg   Cyanocobalamin (VITAMIN B-12) 2500 MCG SUBL Place 2,500 mcg under the tongue daily.   DULoxetine (CYMBALTA) 20 MG capsule TAKE 1 CAPSULE(20 MG) BY MOUTH DAILY   ELDERBERRY PO Take 1 capsule by mouth daily.   estradiol (ESTRACE) 0.1 MG/GM vaginal cream Place 1 Applicatorful vaginally every other day. In the evening   fluticasone (FLONASE) 50 MCG/ACT nasal spray Place 2 sprays into both nostrils daily.   glucose blood (ONETOUCH VERIO) test strip 1 each by Other route 2 (two) times daily. Use as instructed   Lancets (ONETOUCH DELICA PLUS LANCET33G) MISC USE ONCE DAILY   lisinopril (ZESTRIL) 10 MG tablet TAKE 1 TABLET(10 MG) BY MOUTH DAILY   meclizine (ANTIVERT) 25 MG tablet Take 1 tablet (25 mg total) by mouth 3 (three) times daily as needed for dizziness.  Misc Natural Products (TART CHERRY ADVANCED) CAPS Take 2 capsules by mouth daily.   Multiple Vitamins-Minerals (MULTIVITAMIN WITH MINERALS) tablet Take 1 tablet by mouth daily. Centrum silver   MYRBETRIQ 25 MG TB24 tablet Take 25 mg by mouth at bedtime.   OVER THE COUNTER MEDICATION Take 1 tablet by mouth daily as needed (constipation). Vital Lax   polyethylene glycol (MIRALAX / GLYCOLAX) packet Take 17 g by mouth daily as needed for mild constipation.   rosuvastatin (CRESTOR) 40 MG tablet Take 1 tablet (40 mg total) by mouth daily.   tiZANidine (ZANAFLEX) 2 MG tablet Take 1 tablet (2 mg total) by mouth at bedtime.   Turmeric 500 MG CAPS Take 1,000 mg by mouth daily.    [DISCONTINUED]  valsartan (DIOVAN) 160 MG tablet Take 1/2 tablet by mouth once daily   No facility-administered encounter medications on file as of 09/06/2023.    Allergies (verified) Flexeril [cyclobenzaprine] and Penicillins   History: Past Medical History:  Diagnosis Date   Chronic constipation    DJD (degenerative joint disease)    knees   Female cystocele    GERD (gastroesophageal reflux disease)    History of esophagitis    History of left inguinal hernia    History of MRSA infection    recurrent carbuncle   History of recurrent UTIs    Hyperlipidemia    Hypertension    Pre-diabetes    diet controlled   Urgency of urination    Past Surgical History:  Procedure Laterality Date   APPENDECTOMY  1980's   BREAST EXCISIONAL BIOPSY Left 1989   COLONOSCOPY  last one 06-18-2015   INGUINAL HERNIA REPAIR Left 05-10-2001   LAPAROSCOPIC LYSIS OF ADHESIONS  01/17/2019   OOPHORECTOMY Right 01/17/2019   PERINEOPLASTY  01/17/2019   SALPINGECTOMY Left 01/17/2019   TOTAL HIP ARTHROPLASTY Right 04/09/2020   Procedure: RIGH TOTAL HIP ARTHROPLASTY ANTERIOR APPROACH;  Surgeon: Marcene Corning, MD;  Location: [facility];  Service: Orthopedics;  Laterality: Right;   TOTAL HIP ARTHROPLASTY Left 01/28/2021   Procedure: LEFT TOTAL HIP ARTHROPLASTY ANTERIOR APPROACH;  Surgeon: Marcene Corning, MD;  Location: [facility];  Service: Orthopedics;  Laterality: Left;   TOTAL KNEE ARTHROPLASTY Left 07/20/2023   Procedure: LEFT TOTAL KNEE ARTHROPLASTY;  Surgeon: Marcene Corning, MD;  Location: [facility];  Service: Orthopedics;  Laterality: Left;   VAGINAL HYSTERECTOMY  1980's   VAGINAL PROLAPSE REPAIR N/A 03/02/2016   Procedure: COLOPLAST ANTERIOR  VAULT REPAIR WITH AXIS DERMIS. SACROSPINUS FIXATION, AUGMENTATION WITH AXIS DERMIS;  Surgeon: Jethro Bolus, MD;  Location: [facility];  Service: Urology;  Laterality: N/A;   Family History  Problem Relation Age of Onset   Aneurysm Mother 51       deceased  secondary to brain aneurysm   Liver cancer Father 25       deceased   Diabetes Other        grandmother   Colon cancer Neg Hx    Stomach cancer Neg Hx    Rectal cancer Neg Hx    Esophageal cancer Neg Hx    Breast cancer Neg Hx    Social History   Socioeconomic History   Marital status: Single    Spouse name: Not on file   Number of children: Not on file   Years of education: Not on file   Highest education level: Not on file  Occupational History   Not on file  Tobacco Use   Smoking status: Former    Current packs/day:  0.00    Types: Cigarettes    Start date: 02/28/1995    Quit date: 02/28/1996    Years since quitting: 27.5   Smokeless tobacco: Never  Vaping Use   Vaping status: Never Used  Substance and Sexual Activity   Alcohol use: Yes    Alcohol/week: 4.0 standard drinks of alcohol    Types: 4 Standard drinks or equivalent per week    Comment: on weekends   Drug use: No   Sexual activity: Yes    Birth control/protection: Surgical  Other Topics Concern   Not on file  Social History Narrative   Single   Former Smoker  -  quit 10 to 11 years ago (light smoker)   Alcohol use-yes     2 children    Occupation: [employer]    Social Determinants of Health   Financial Resource Strain: Low Risk  (09/06/2023)   Overall Financial Resource Strain (CARDIA)    Difficulty of Paying Living Expenses: Not hard at all  Food Insecurity: No Food Insecurity (09/06/2023)   Hunger Vital Sign    Worried About Running Out of Food in the Last Year: Never true    Ran Out of Food in the Last Year: Never true  Transportation Needs: No Transportation Needs (09/06/2023)   PRAPARE - Administrator, Civil Service (Medical): No    Lack of Transportation (Non-Medical): No  Physical Activity: Insufficiently Active (09/06/2023)   Exercise Vital Sign    Days of Exercise per Week: 3 days    Minutes of Exercise per Session: 30 min  Stress: No Stress Concern Present  (09/06/2023)   [employer] of Occupational Health - Occupational Stress Questionnaire    Feeling of Stress : Not at all  Social Connections: Moderately Integrated (09/06/2023)   Social Connection and Isolation Panel [NHANES]    Frequency of Communication with Friends and Family: More than three times a week    Frequency of Social Gatherings with Friends and Family: More than three times a week    Attends Religious Services: More than 4 times per year    Active Member of [employer] or Organizations: Yes    Attends Engineer, structural: More than 4 times per year    Marital Status: Never married    Tobacco Counseling Counseling given: Not Answered   Clinical Intake:  Pre-visit preparation completed: Yes  Pain : 0-10 Pain Score: 7  Pain Type: Acute pain Pain Location: Knee Pain Orientation: Left Pain Radiating Towards: OTC Pain Descriptors / Indicators: Nagging Pain Onset: More than a month ago Pain Frequency: Intermittent Pain Relieving Factors: OTC Meds  Pain Relieving Factors: OTC Meds  BMI - recorded: 25.33 Nutritional Status: BMI 25 -29 Overweight Nutritional Risks: None Diabetes: Yes CBG done?: Yes (CBG 112 Per patient) CBG resulted in Enter/ Edit results?: Yes Did pt. bring in CBG monitor from home?: No  How often do you need to have someone help you when you read instructions, pamphlets, or other written materials from your doctor or pharmacy?: 1 - Never  Interpreter Needed?: No  Information entered by :: Theresa Mulligan LPN   Activities of Daily Living    09/06/2023    8:30 AM 07/08/2023    7:52 AM  In your present state of health, do you have any difficulty performing the following activities:  Hearing? 0   Vision? 0   Difficulty concentrating or making decisions? 0   Walking or climbing stairs? 0  Dressing or bathing? 0   Doing errands, shopping? 0 0  Preparing Food and eating ? N   Using the Toilet? N   In the past six months, have you  accidently leaked urine? N   Do you have problems with loss of bowel control? N   Managing your Medications? N   Managing your Finances? N   Housekeeping or managing your Housekeeping? N     Patient Care Team: Shirline Frees, NP as PCP - General (Family Medicine) Crista Elliot, MD as Consulting Physician (Urology)  Indicate any recent Medical Services you may have received from other than [facility] providers in the past year (date may be approximate).     Assessment:   This is a routine wellness examination for Sevana.  Hearing/Vision screen Hearing Screening - Comments:: Denies hearing difficulties   Vision Screening - Comments:: Wears reading glasses - up to date with routine eye exams with  [facility]   Goals Addressed               This Visit's Progress     Increase physical activity (pt-stated)        Stay Healthy!       Depression Screen    09/06/2023    8:29 AM 09/15/2022    3:19 PM 08/26/2022    7:12 AM 07/14/2022    8:41 AM 08/13/2021    7:11 AM 11/01/2020   10:53 AM 07/30/2020   10:15 AM  PHQ 2/9 Scores  PHQ - 2 Score 0 0 0 0 1 0 0  PHQ- 9 Score   0  1      Fall Risk    09/06/2023    8:30 AM 09/15/2022    3:19 PM 08/26/2022    7:13 AM 07/14/2022    8:43 AM 11/27/2021    7:30 AM  Fall Risk   Falls in the past year? 0 0 0 1 0  Number falls in past yr: 0 0 0 0 0  Injury with Fall? 0 0 0 0 0  Risk for fall due to : No Fall Risks Other (Comment) No Fall Risks No Fall Risks   Follow up Falls prevention discussed Falls evaluation completed Falls evaluation completed      MEDICARE RISK AT HOME: Medicare Risk at Home Any stairs in or around the home?: No If so, are there any without handrails?: No Home free of loose throw rugs in walkways, pet beds, electrical cords, etc?: Yes Adequate lighting in your home to reduce risk of falls?: Yes Life alert?: Yes Use of a cane, walker or w/c?: Yes Grab bars in the bathroom?: Yes Shower chair or bench in  shower?: No Elevated toilet seat or a handicapped toilet?: Yes  TIMED UP AND GO:  Was the test performed?  No    Cognitive Function:        09/06/2023    8:31 AM 07/14/2022    8:44 AM 06/10/2020   10:00 AM  6CIT Screen  What Year? 0 points 0 points 0 points  What month? 0 points 0 points 0 points  What time? 0 points 0 points 0 points  Count back from 20 0 points 0 points 0 points  Months in reverse 0 points 0 points 0 points  Repeat phrase 0 points 0 points 2 points  Total Score 0 points 0 points 2 points    Immunizations Immunization History  Administered Date(s) Administered   Fluad Quad(high Dose 65+) 07/28/2019, 07/30/2020,  08/13/2021, 08/24/2023   Influenza Inj Mdck Quad Pf 08/03/2022   Influenza Split 09/22/2011, 11/02/2012   Influenza Whole 08/27/2008, 08/29/2010   Influenza, High Dose Seasonal PF 08/26/2017, 08/26/2017, 08/06/2018   Influenza,inj,Quad PF,6+ Mos 10/18/2013, 07/17/2014   Moderna Sars-Covid-2 Vaccination 12/17/2019, 01/14/2020   Pneumococcal Conjugate-13 10/10/2015   Pneumococcal Polysaccharide-23 12/18/2011, 07/01/2017, 08/28/2017, 08/28/2017   Rsv, Bivalent, Protein Subunit Rsvpref,pf Verdis Frederickson) 08/03/2022   Td 12/19/2007, 08/08/2022   Tdap 08/08/2022   Unspecified SARS-COV-2 Vaccination 08/08/2022   Zoster Recombinant(Shingrix) 08/06/2018, 08/06/2018, 10/06/2018, 10/10/2018, 10/10/2018   Zoster, Live 03/07/2015    TDAP status: Up to date  Flu Vaccine status: Up to date  Pneumococcal vaccine status: Up to date  Covid-19 vaccine status: Declined, Education has been provided regarding the importance of this vaccine but patient still declined. Advised may receive this vaccine at local pharmacy or Health Dept.or vaccine clinic. Aware to provide a copy of the vaccination record if obtained from local pharmacy or Health Dept. Verbalized acceptance and understanding.  Qualifies for Shingles Vaccine? Yes   Zostavax completed Yes   Shingrix  Completed?: Yes  Screening Tests Health Maintenance  Topic Date Due   OPHTHALMOLOGY EXAM  06/23/2021   COVID-19 Vaccine (4 - 2023-24 season) 07/25/2023   Diabetic kidney evaluation - Urine ACR  08/27/2023   HEMOGLOBIN A1C  08/27/2023   MAMMOGRAM  05/31/2024   FOOT EXAM  06/28/2024   Diabetic kidney evaluation - eGFR measurement  07/07/2024   Medicare Annual Wellness (AWV)  09/05/2024   Colonoscopy  06/17/2025   DTaP/Tdap/Td (4 - Td or Tdap) 08/08/2032   Pneumonia Vaccine 21+ Years old  Completed   INFLUENZA VACCINE  Completed   DEXA SCAN  Completed   Hepatitis C Screening  Completed   Zoster Vaccines- Shingrix  Completed   HPV VACCINES  Aged Out    Health Maintenance  Health Maintenance Due  Topic Date Due   OPHTHALMOLOGY EXAM  06/23/2021   COVID-19 Vaccine (4 - 2023-24 season) 07/25/2023   Diabetic kidney evaluation - Urine ACR  08/27/2023   HEMOGLOBIN A1C  08/27/2023    Colorectal cancer screening: Type of screening: Colonoscopy. Completed 06/17/25. Repeat every 10 years  Mammogram status: Completed 06/01/23. Repeat every year      Additional Screening:  Hepatitis C Screening: does qualify; Completed 10/28/15  Vision Screening: Recommended annual ophthalmology exams for early detection of glaucoma and other disorders of the eye. Is the patient up to date with their annual eye exam?  Yes  Who is the provider or what is the name of the office in which the patient attends annual eye exams? [facility] If pt is not established with a provider, would they like to be referred to a provider to establish care? No .   Dental Screening: Recommended annual dental exams for proper oral hygiene  Diabetic Foot Exam: Diabetic Foot Exam: Completed 06/29/23  Community Resource Referral / Chronic Care Management:  CRR required this visit?  No   CCM required this visit?  No     Plan:     I have personally reviewed and noted the following in the patient's chart:   Medical  and social history Use of alcohol, tobacco or illicit drugs  Current medications and supplements including opioid prescriptions. Patient is not currently taking opioid prescriptions. Functional ability and status Nutritional status Physical activity Advanced directives List of other physicians Hospitalizations, surgeries, and ER visits in previous 12 months Vitals Screenings to include cognitive, depression, and falls Referrals  and appointments  In addition, I have reviewed and discussed with patient certain preventive protocols, quality metrics, and best practice recommendations. A written personalized care plan for preventive services as well as general preventive health recommendations were provided to patient.     Tillie Rung, LPN   19/14/7829   After Visit Summary: (MyChart) Due to this being a telephonic visit, the after visit summary with patients personalized plan was offered to patient via MyChart   Nurse Notes: None

## 2023-09-07 DIAGNOSIS — Z96652 Presence of left artificial knee joint: Secondary | ICD-10-CM | POA: Diagnosis not present

## 2023-09-07 DIAGNOSIS — M25662 Stiffness of left knee, not elsewhere classified: Secondary | ICD-10-CM | POA: Diagnosis not present

## 2023-09-09 DIAGNOSIS — M25662 Stiffness of left knee, not elsewhere classified: Secondary | ICD-10-CM | POA: Diagnosis not present

## 2023-09-09 DIAGNOSIS — Z96652 Presence of left artificial knee joint: Secondary | ICD-10-CM | POA: Diagnosis not present

## 2023-09-14 DIAGNOSIS — M25662 Stiffness of left knee, not elsewhere classified: Secondary | ICD-10-CM | POA: Diagnosis not present

## 2023-09-14 DIAGNOSIS — Z96652 Presence of left artificial knee joint: Secondary | ICD-10-CM | POA: Diagnosis not present

## 2023-09-16 DIAGNOSIS — M25662 Stiffness of left knee, not elsewhere classified: Secondary | ICD-10-CM | POA: Diagnosis not present

## 2023-09-16 DIAGNOSIS — Z96652 Presence of left artificial knee joint: Secondary | ICD-10-CM | POA: Diagnosis not present

## 2023-09-20 DIAGNOSIS — Z96652 Presence of left artificial knee joint: Secondary | ICD-10-CM | POA: Diagnosis not present

## 2023-09-20 DIAGNOSIS — M25662 Stiffness of left knee, not elsewhere classified: Secondary | ICD-10-CM | POA: Diagnosis not present

## 2023-09-25 ENCOUNTER — Other Ambulatory Visit: Payer: Self-pay | Admitting: Adult Health

## 2023-09-25 DIAGNOSIS — H8113 Benign paroxysmal vertigo, bilateral: Secondary | ICD-10-CM

## 2023-09-27 DIAGNOSIS — M25511 Pain in right shoulder: Secondary | ICD-10-CM | POA: Diagnosis not present

## 2023-09-27 DIAGNOSIS — M25662 Stiffness of left knee, not elsewhere classified: Secondary | ICD-10-CM | POA: Diagnosis not present

## 2023-09-28 DIAGNOSIS — Z96652 Presence of left artificial knee joint: Secondary | ICD-10-CM | POA: Diagnosis not present

## 2023-09-28 DIAGNOSIS — M25662 Stiffness of left knee, not elsewhere classified: Secondary | ICD-10-CM | POA: Diagnosis not present

## 2023-09-29 DIAGNOSIS — M25662 Stiffness of left knee, not elsewhere classified: Secondary | ICD-10-CM | POA: Diagnosis not present

## 2023-09-29 DIAGNOSIS — Z96652 Presence of left artificial knee joint: Secondary | ICD-10-CM | POA: Diagnosis not present

## 2023-09-30 NOTE — Progress Notes (Deleted)
[name] [address] [place] [address] Phone: [phone] Subjective:    I'm seeing this patient by the request  of:  Cathy Frees, NP  CC:   Cathy [name]  05/05/2023 Bilateral injections did respond well, discussed icing regimen and home exercises.  Discussed symptomatic and when to seek medical attention.  Right is worse than left on ultrasound today.  Discussed icing regimen and home exercises.  Discussed stretching mechanics and wearing braces.  Follow-up again in 2 to 3 months.  If worsening pain consider surgical intervention.     Chronic, with worsening symptoms that are discussing daily activities. Discussed with patient again at great length about icing regimen, home exercises, which activities to do and which ones to avoid. Increase activity slowly. Patient is considering the possibility of surgical intervention on the left knee in the relatively near future. Wants to see how these injections do. Follow-up with me again in 8 weeks otherwise.   Update 10/04/2023 Cathy [name] is a 77 y.o. female coming in with complaint of B hand stiffness. Patient states       Past Medical History:  Diagnosis Date   Chronic constipation    DJD (degenerative joint disease)    knees   Female cystocele    GERD (gastroesophageal reflux disease)    History of esophagitis    History of left inguinal hernia    History of MRSA infection    recurrent carbuncle   History of recurrent UTIs    Hyperlipidemia    Hypertension    Pre-diabetes    diet controlled   Urgency of urination    Past Surgical History:  Procedure Laterality Date   APPENDECTOMY  1980's   BREAST EXCISIONAL BIOPSY Left 1989   COLONOSCOPY  last one 06-18-2015   INGUINAL HERNIA REPAIR Left 05-10-2001   LAPAROSCOPIC LYSIS OF ADHESIONS  01/17/2019   OOPHORECTOMY Right 01/17/2019   PERINEOPLASTY  01/17/2019   SALPINGECTOMY Left 01/17/2019   TOTAL HIP ARTHROPLASTY Right  04/09/2020   Procedure: RIGH TOTAL HIP ARTHROPLASTY ANTERIOR APPROACH;  Surgeon: Marcene Corning, MD;  Location: [facility];  Service: Orthopedics;  Laterality: Right;   TOTAL HIP ARTHROPLASTY Left 01/28/2021   Procedure: LEFT TOTAL HIP ARTHROPLASTY ANTERIOR APPROACH;  Surgeon: Marcene Corning, MD;  Location: [facility];  Service: Orthopedics;  Laterality: Left;   TOTAL KNEE ARTHROPLASTY Left 07/20/2023   Procedure: LEFT TOTAL KNEE ARTHROPLASTY;  Surgeon: Marcene Corning, MD;  Location: [facility];  Service: Orthopedics;  Laterality: Left;   VAGINAL HYSTERECTOMY  1980's   VAGINAL PROLAPSE REPAIR N/A 03/02/2016   Procedure: COLOPLAST ANTERIOR  VAULT REPAIR WITH AXIS DERMIS. SACROSPINUS FIXATION, AUGMENTATION WITH AXIS DERMIS;  Surgeon: Jethro Bolus, MD;  Location: [facility];  Service: Urology;  Laterality: N/A;   Social History   Socioeconomic History   Marital status: Single    Spouse name: Not on file   Number of children: Not on file   Years of education: Not on file   Highest education level: Not on file  Occupational History   Not on file  Tobacco Use   Smoking status: Former    Current packs/day: 0.00    Types: Cigarettes    Start date: 02/28/1995    Quit date: 02/28/1996    Years since quitting: 27.6   Smokeless tobacco: Never  Vaping Use   Vaping status: Never Used  Substance and Sexual Activity   Alcohol use: Yes    Alcohol/week: 4.0 standard drinks  of alcohol    Types: 4 Standard drinks or equivalent per week    Comment: on weekends   Drug use: No   Sexual activity: Yes    Birth control/protection: Surgical  Other Topics Concern   Not on file  Social History Narrative   Single   Former Smoker  -  quit 10 to 11 years ago (light smoker)   Alcohol use-yes     2 children    Occupation: [employer]    Social Determinants of Health   Financial Resource Strain: Low Risk  (09/06/2023)   Overall Financial Resource Strain (CARDIA)    Difficulty of  Paying Living Expenses: Not hard at all  Food Insecurity: No Food Insecurity (09/06/2023)   Hunger Vital Sign    Worried About Running Out of Food in the Last Year: Never true    Ran Out of Food in the Last Year: Never true  Transportation Needs: No Transportation Needs (09/06/2023)   PRAPARE - Administrator, Civil Service (Medical): No    Lack of Transportation (Non-Medical): No  Physical Activity: Insufficiently Active (09/06/2023)   Exercise Vital Sign    Days of Exercise per Week: 3 days    Minutes of Exercise per Session: 30 min  Stress: No Stress Concern Present (09/06/2023)   [employer] of Occupational Health - Occupational Stress Questionnaire    Feeling of Stress : Not at all  Social Connections: Moderately Integrated (09/06/2023)   Social Connection and Isolation Panel [NHANES]    Frequency of Communication with Friends and Family: More than three times a week    Frequency of Social Gatherings with Friends and Family: More than three times a week    Attends Religious Services: More than 4 times per year    Active Member of [employer] or Organizations: Yes    Attends Engineer, structural: More than 4 times per year    Marital Status: Never married   Allergies  Allergen Reactions   Flexeril [Cyclobenzaprine]     Hallucinations    Penicillins Hives    Has patient had a PCN reaction causing immediate rash, facial/tongue/throat swelling, SOB or lightheadedness with hypotension: Yes Has patient had a PCN reaction causing severe rash involving mucus membranes or skin necrosis: No Has patient had a PCN reaction that required hospitalization: No Has patient had a PCN reaction occurring within the last 10 years: No If all of the above answers are "NO", then may proceed with Cephalosporin use.    Family History  Problem Relation Age of Onset   Aneurysm Mother 4       deceased secondary to brain aneurysm   Liver cancer Father 74       deceased    Diabetes Other        grandmother   Colon cancer Neg Hx    Stomach cancer Neg Hx    Rectal cancer Neg Hx    Esophageal cancer Neg Hx    Breast cancer Neg Hx      Current Outpatient Medications (Cardiovascular):    lisinopril (ZESTRIL) 10 MG tablet, TAKE 1 TABLET(10 MG) BY MOUTH DAILY   rosuvastatin (CRESTOR) 40 MG tablet, Take 1 tablet (40 mg total) by mouth daily.  Current Outpatient Medications (Respiratory):    benzonatate (TESSALON) 100 MG capsule, Take 1 capsule (100 mg total) by mouth every 8 (eight) hours.   fluticasone (FLONASE) 50 MCG/ACT nasal spray, Place 2 sprays into both nostrils daily.  Current  Outpatient Medications (Analgesics):    aspirin 81 MG tablet, Take 1 tablet (81 mg total) by mouth 2 (two) times daily after a meal.  Current Outpatient Medications (Hematological):    Cyanocobalamin (VITAMIN B-12) 2500 MCG SUBL, Place 2,500 mcg under the tongue daily.  Current Outpatient Medications (Other):    Ascorbic Acid (VITAMIN C) 1000 MG tablet, Take 1,000 mg by mouth daily.   b complex vitamins capsule, Take 1 capsule by mouth daily.   Biotin 1000 MCG tablet, Take 1,000 mcg by mouth daily.   Blood Glucose Monitoring Suppl (ONETOUCH VERIO FLEX SYSTEM) w/Device KIT, TEST TWICE DAILY   Calcium Carbonate-Vitamin D 600-400 MG-UNIT tablet, Take 1 tablet by mouth daily.   Chromium 1000 MCG TABS, Take 1,000 mcg by mouth daily.   Cinnamon 500 MG capsule, Take 1,000 mg by mouth daily.   CRANBERRY PO, Take 2 tablets by mouth daily. 4200 mg   DULoxetine (CYMBALTA) 20 MG capsule, TAKE 1 CAPSULE(20 MG) BY MOUTH DAILY   ELDERBERRY PO, Take 1 capsule by mouth daily.   estradiol (ESTRACE) 0.1 MG/GM vaginal cream, Place 1 Applicatorful vaginally every other day. In the evening   glucose blood (ONETOUCH VERIO) test strip, 1 each by Other route 2 (two) times daily. Use as instructed   Lancets (ONETOUCH DELICA PLUS LANCET33G) MISC, USE ONCE DAILY   meclizine (ANTIVERT) 25 MG tablet,  TAKE 1 TABLET(25 MG) BY MOUTH THREE TIMES DAILY AS NEEDED FOR DIZZINESS   Misc Natural Products (TART CHERRY ADVANCED) CAPS, Take 2 capsules by mouth daily.   Multiple Vitamins-Minerals (MULTIVITAMIN WITH MINERALS) tablet, Take 1 tablet by mouth daily. Centrum silver   MYRBETRIQ 25 MG TB24 tablet, Take 25 mg by mouth at bedtime.   OVER THE COUNTER MEDICATION, Take 1 tablet by mouth daily as needed (constipation). Vital Lax   polyethylene glycol (MIRALAX / GLYCOLAX) packet, Take 17 g by mouth daily as needed for mild constipation.   tiZANidine (ZANAFLEX) 2 MG tablet, Take 1 tablet (2 mg total) by mouth at bedtime.   Turmeric 500 MG CAPS, Take 1,000 mg by mouth daily.    Reviewed prior external information including notes and imaging from  primary care provider As well as notes that were available from care everywhere and other healthcare systems.  Past medical history, social, surgical and family history all reviewed in electronic medical record.  No pertanent information unless stated regarding to the chief complaint.   Review of Systems:  No headache, visual changes, nausea, vomiting, diarrhea, constipation, dizziness, abdominal pain, skin rash, fevers, chills, night sweats, weight loss, swollen lymph nodes, body aches, joint swelling, chest pain, shortness of breath, mood changes. POSITIVE muscle aches  Objective  There were no vitals taken for this visit.   General: No apparent distress alert and oriented x3 mood and affect normal, dressed appropriately.  HEENT: Pupils equal, extraocular movements intact  Respiratory: Patient's speak in full sentences and does not appear short of breath  Cardiovascular: No lower extremity edema, non tender, no erythema      Impression and Recommendations:

## 2023-10-01 ENCOUNTER — Encounter: Payer: Self-pay | Admitting: Family Medicine

## 2023-10-01 ENCOUNTER — Other Ambulatory Visit: Payer: Self-pay

## 2023-10-01 ENCOUNTER — Ambulatory Visit (INDEPENDENT_AMBULATORY_CARE_PROVIDER_SITE_OTHER): Payer: 59 | Admitting: Family Medicine

## 2023-10-01 VITALS — BP 132/82 | HR 96 | Ht 63.0 in | Wt 146.0 lb

## 2023-10-01 DIAGNOSIS — M79641 Pain in right hand: Secondary | ICD-10-CM | POA: Diagnosis not present

## 2023-10-01 DIAGNOSIS — G5603 Carpal tunnel syndrome, bilateral upper limbs: Secondary | ICD-10-CM

## 2023-10-01 DIAGNOSIS — M79642 Pain in left hand: Secondary | ICD-10-CM

## 2023-10-01 DIAGNOSIS — M1711 Unilateral primary osteoarthritis, right knee: Secondary | ICD-10-CM | POA: Diagnosis not present

## 2023-10-01 NOTE — Patient Instructions (Signed)
Happy holidays See me

## 2023-10-01 NOTE — Assessment & Plan Note (Signed)
Chronic problem with exacerbation.  Discussed icing regimen and home exercises.  Discussed icing regimen bracing, patient wants to avoid any more surgical intervention for quite some time.  Do not see any thenar eminence that makes me feel that is necessary at the moment.  Follow-up again in 3 months if needed

## 2023-10-01 NOTE — Assessment & Plan Note (Signed)
Degenerative arthritis.  Chronic problem with worsening symptoms.  Affecting daily activities.  Patient still recuperating from the surgery on the contralateral side.  Discussed icing regimen of home exercises, increase activity slowly.  Follow-up again in 6 to 8 weeks

## 2023-10-01 NOTE — Progress Notes (Signed)
[name] [address] [place] [address] Phone: [phone] Subjective:   Bruce Donath, am serving as a scribe for Dr. Antoine Primas.  I'm seeing this patient by the request  of:  Shirline Frees, NP  CC: Bilateral wrist pain and right knee pain  [id number]  05/05/2023 Bilateral injections did respond well, discussed icing regimen and home exercises. Discussed symptomatic and when to seek medical attention. Right is worse than left on ultrasound today. Discussed icing regimen and home exercises. Discussed stretching mechanics and wearing braces. Follow-up again in 2 to 3 months. If worsening pain consider surgical intervention.   Chronic, with worsening symptoms that are discussing daily activities.  Discussed with patient again at great length about icing regimen, home exercises, which activities to do and which ones to avoid.  Increase activity slowly.  Patient is considering the possibility of surgical intervention on the left knee in the relatively near future.  Wants to see how these injections do.  Follow-up with me again in 8 weeks otherwise.      Update 10/01/2023 Cathy [name] is a 76 y.o. female coming in with complaint of B hand and R knee pain. Patient states she wants to get injection in all joints today.    Dr.     Past Medical History:  Diagnosis Date   Chronic constipation    DJD (degenerative joint disease)    knees   Female cystocele    GERD (gastroesophageal reflux disease)    History of esophagitis    History of left inguinal hernia    History of MRSA infection    recurrent carbuncle   History of recurrent UTIs    Hyperlipidemia    Hypertension    Pre-diabetes    diet controlled   Urgency of urination    Past Surgical History:  Procedure Laterality Date   APPENDECTOMY  1980's   BREAST EXCISIONAL BIOPSY Left 1989   COLONOSCOPY  last one 06-18-2015   INGUINAL HERNIA REPAIR Left 05-10-2001    LAPAROSCOPIC LYSIS OF ADHESIONS  01/17/2019   OOPHORECTOMY Right 01/17/2019   PERINEOPLASTY  01/17/2019   SALPINGECTOMY Left 01/17/2019   TOTAL HIP ARTHROPLASTY Right 04/09/2020   Procedure: RIGH TOTAL HIP ARTHROPLASTY ANTERIOR APPROACH;  Surgeon: Marcene Corning, MD;  Location: [facility];  Service: Orthopedics;  Laterality: Right;   TOTAL HIP ARTHROPLASTY Left 01/28/2021   Procedure: LEFT TOTAL HIP ARTHROPLASTY ANTERIOR APPROACH;  Surgeon: Marcene Corning, MD;  Location: [facility];  Service: Orthopedics;  Laterality: Left;   TOTAL KNEE ARTHROPLASTY Left 07/20/2023   Procedure: LEFT TOTAL KNEE ARTHROPLASTY;  Surgeon: Marcene Corning, MD;  Location: [facility];  Service: Orthopedics;  Laterality: Left;   VAGINAL HYSTERECTOMY  1980's   VAGINAL PROLAPSE REPAIR N/A 03/02/2016   Procedure: COLOPLAST ANTERIOR  VAULT REPAIR WITH AXIS DERMIS. SACROSPINUS FIXATION, AUGMENTATION WITH AXIS DERMIS;  Surgeon: Jethro Bolus, MD;  Location: [facility];  Service: Urology;  Laterality: N/A;   Social History   Socioeconomic History   Marital status: Single    Spouse name: Not on file   Number of children: Not on file   Years of education: Not on file   Highest education level: Not on file  Occupational History   Not on file  Tobacco Use   Smoking status: Former    Current packs/day: 0.00    Types: Cigarettes    Start date: 02/28/1995    Quit date: 02/28/1996    Years since quitting:  27.6   Smokeless tobacco: Never  Vaping Use   Vaping status: Never Used  Substance and Sexual Activity   Alcohol use: Yes    Alcohol/week: 4.0 standard drinks of alcohol    Types: 4 Standard drinks or equivalent per week    Comment: on weekends   Drug use: No   Sexual activity: Yes    Birth control/protection: Surgical  Other Topics Concern   Not on file  Social History Narrative   Single   Former Smoker  -  quit 10 to 11 years ago (light smoker)   Alcohol use-yes     2 children    Occupation: [employer]    Social Determinants of Health   Financial Resource Strain: Low Risk  (09/06/2023)   Overall Financial Resource Strain (CARDIA)    Difficulty of Paying Living Expenses: Not hard at all  Food Insecurity: No Food Insecurity (09/06/2023)   Hunger Vital Sign    Worried About Running Out of Food in the Last Year: Never true    Ran Out of Food in the Last Year: Never true  Transportation Needs: No Transportation Needs (09/06/2023)   PRAPARE - Administrator, Civil Service (Medical): No    Lack of Transportation (Non-Medical): No  Physical Activity: Insufficiently Active (09/06/2023)   Exercise Vital Sign    Days of Exercise per Week: 3 days    Minutes of Exercise per Session: 30 min  Stress: No Stress Concern Present (09/06/2023)   [employer] of Occupational Health - Occupational Stress Questionnaire    Feeling of Stress : Not at all  Social Connections: Moderately Integrated (09/06/2023)   Social Connection and Isolation Panel [NHANES]    Frequency of Communication with Friends and Family: More than three times a week    Frequency of Social Gatherings with Friends and Family: More than three times a week    Attends Religious Services: More than 4 times per year    Active Member of [employer] or Organizations: Yes    Attends Engineer, structural: More than 4 times per year    Marital Status: Never married   Allergies  Allergen Reactions   Flexeril [Cyclobenzaprine]     Hallucinations    Penicillins Hives    Has patient had a PCN reaction causing immediate rash, facial/tongue/throat swelling, SOB or lightheadedness with hypotension: Yes Has patient had a PCN reaction causing severe rash involving mucus membranes or skin necrosis: No Has patient had a PCN reaction that required hospitalization: No Has patient had a PCN reaction occurring within the last 10 years: No If all of the above answers are "NO", then may proceed with Cephalosporin  use.    Family History  Problem Relation Age of Onset   Aneurysm Mother 33       deceased secondary to brain aneurysm   Liver cancer Father 32       deceased   Diabetes Other        grandmother   Colon cancer Neg Hx    Stomach cancer Neg Hx    Rectal cancer Neg Hx    Esophageal cancer Neg Hx    Breast cancer Neg Hx      Current Outpatient Medications (Cardiovascular):    lisinopril (ZESTRIL) 10 MG tablet, TAKE 1 TABLET(10 MG) BY MOUTH DAILY   rosuvastatin (CRESTOR) 40 MG tablet, Take 1 tablet (40 mg total) by mouth daily.  Current Outpatient Medications (Respiratory):    benzonatate (TESSALON)  100 MG capsule, Take 1 capsule (100 mg total) by mouth every 8 (eight) hours.   fluticasone (FLONASE) 50 MCG/ACT nasal spray, Place 2 sprays into both nostrils daily.  Current Outpatient Medications (Analgesics):    aspirin 81 MG tablet, Take 1 tablet (81 mg total) by mouth 2 (two) times daily after a meal.  Current Outpatient Medications (Hematological):    Cyanocobalamin (VITAMIN B-12) 2500 MCG SUBL, Place 2,500 mcg under the tongue daily.  Current Outpatient Medications (Other):    Ascorbic Acid (VITAMIN C) 1000 MG tablet, Take 1,000 mg by mouth daily.   b complex vitamins capsule, Take 1 capsule by mouth daily.   Biotin 1000 MCG tablet, Take 1,000 mcg by mouth daily.   Blood Glucose Monitoring Suppl (ONETOUCH VERIO FLEX SYSTEM) w/Device KIT, TEST TWICE DAILY   Calcium Carbonate-Vitamin D 600-400 MG-UNIT tablet, Take 1 tablet by mouth daily.   Chromium 1000 MCG TABS, Take 1,000 mcg by mouth daily.   Cinnamon 500 MG capsule, Take 1,000 mg by mouth daily.   CRANBERRY PO, Take 2 tablets by mouth daily. 4200 mg   DULoxetine (CYMBALTA) 20 MG capsule, TAKE 1 CAPSULE(20 MG) BY MOUTH DAILY   ELDERBERRY PO, Take 1 capsule by mouth daily.   estradiol (ESTRACE) 0.1 MG/GM vaginal cream, Place 1 Applicatorful vaginally every other day. In the evening   glucose blood (ONETOUCH VERIO) test  strip, 1 each by Other route 2 (two) times daily. Use as instructed   Lancets (ONETOUCH DELICA PLUS LANCET33G) MISC, USE ONCE DAILY   meclizine (ANTIVERT) 25 MG tablet, TAKE 1 TABLET(25 MG) BY MOUTH THREE TIMES DAILY AS NEEDED FOR DIZZINESS   Misc Natural Products (TART CHERRY ADVANCED) CAPS, Take 2 capsules by mouth daily.   Multiple Vitamins-Minerals (MULTIVITAMIN WITH MINERALS) tablet, Take 1 tablet by mouth daily. Centrum silver   MYRBETRIQ 25 MG TB24 tablet, Take 25 mg by mouth at bedtime.   OVER THE COUNTER MEDICATION, Take 1 tablet by mouth daily as needed (constipation). Vital Lax   polyethylene glycol (MIRALAX / GLYCOLAX) packet, Take 17 g by mouth daily as needed for mild constipation.   tiZANidine (ZANAFLEX) 2 MG tablet, Take 1 tablet (2 mg total) by mouth at bedtime.   Turmeric 500 MG CAPS, Take 1,000 mg by mouth daily.    Reviewed prior external information including notes and imaging from  primary care provider As well as notes that were available from care everywhere and other healthcare systems.  Past medical history, social, surgical and family history all reviewed in electronic medical record.  No pertanent information unless stated regarding to the chief complaint.   Review of Systems:  No headache, visual changes, nausea, vomiting, diarrhea, constipation, dizziness, abdominal pain, skin rash, fevers, chills, night sweats, weight loss, swollen lymph nodes, body aches, joint swelling, chest pain, shortness of breath, mood changes. POSITIVE muscle aches  Objective  Blood pressure 132/82, pulse 96, height 5\' 3"  (1.6 m), weight 146 lb (66.2 kg), SpO2 98%.   General: No apparent distress alert and oriented x3 mood and affect normal, dressed appropriately.  HEENT: Pupils equal, extraocular movements intact  Respiratory: Patient's speak in full sentences and does not appear short of breath  Cardiovascular: No lower extremity edema, non tender, no erythema  Chronic discomfort  noted.  Does have some instability noted of the patellofemoral joint.  Mild instability with valgus and varus force. Bilateral hand exam shows the patient does have positive Tinel's noted.  No thenar eminence wasting.  After informed written  and verbal consent, patient was seated on exam table. Right knee was prepped with alcohol swab and utilizing anterolateral approach, patient's right knee space was injected with 4:1  marcaine 0.5%: Kenalog 40mg /dL. Patient tolerated the procedure well without immediate complications.  Procedure: Real-time Ultrasound Guided Injection of right carpal tunnel Device: GE Logiq Q7 Ultrasound guided injection is preferred based studies that show increased duration, increased effect, greater accuracy, decreased procedural pain, increased response rate with ultrasound guided versus blind injection.  Verbal informed consent obtained.  Time-out conducted.  Noted no overlying erythema, induration, or other signs of local infection.  Skin prepped in a sterile fashion.  Local anesthesia: Topical Ethyl chloride.  With sterile technique and under real time ultrasound guidance:  median nerve visualized.  23g 5/8 inch needle inserted distal to proximal approach into nerve sheath. Pictures taken nfor needle placement. Patient did have injection of 0.5 cc of 0.5% Marcaine, and 0.5 cc of Kenalog 40 mg/dL. Completed without difficulty  Pain immediately resolved suggesting accurate placement of the medication.  Advised to call if fevers/chills, erythema, induration, drainage, or persistent bleeding.  Impression: Technically successful ultrasound guided injection.  Procedure: Real-time Ultrasound Guided Injection of right carpal tunnel Device: GE Logiq Q7 Ultrasound guided injection is preferred based studies that show increased duration, increased effect, greater accuracy, decreased procedural pain, increased response rate with ultrasound guided versus blind injection.  Verbal  informed consent obtained.  Time-out conducted.  Noted no overlying erythema, induration, or other signs of local infection.  Skin prepped in a sterile fashion.  Local anesthesia: Topical Ethyl chloride.  With sterile technique and under real time ultrasound guidance:  median nerve visualized.  23g 5/8 inch needle inserted distal to proximal approach into nerve sheath. Pictures taken nfor needle placement. Patient did have injection of 0.5 cc of 0.5% Marcaine, and 0.5 cc of Kenalog 40 mg/dL. Completed without difficulty  Pain immediately resolved suggesting accurate placement of the medication.  Advised to call if fevers/chills, erythema, induration, drainage, or persistent bleeding.  Impression: Technically successful ultrasound guided injection.   Impression and Recommendations:    The above documentation has been reviewed and is accurate and complete Judi Saa, DO

## 2023-10-04 ENCOUNTER — Ambulatory Visit: Payer: 59 | Admitting: Family Medicine

## 2023-10-05 DIAGNOSIS — Z96652 Presence of left artificial knee joint: Secondary | ICD-10-CM | POA: Diagnosis not present

## 2023-10-05 DIAGNOSIS — M25662 Stiffness of left knee, not elsewhere classified: Secondary | ICD-10-CM | POA: Diagnosis not present

## 2023-10-08 DIAGNOSIS — Z96652 Presence of left artificial knee joint: Secondary | ICD-10-CM | POA: Diagnosis not present

## 2023-10-08 DIAGNOSIS — M25662 Stiffness of left knee, not elsewhere classified: Secondary | ICD-10-CM | POA: Diagnosis not present

## 2023-10-11 ENCOUNTER — Telehealth: Payer: Self-pay

## 2023-10-11 ENCOUNTER — Telehealth: Payer: Self-pay | Admitting: Family Medicine

## 2023-10-11 NOTE — Patient Outreach (Signed)
Attempted to contact patient regarding DM Eye, Urine Microalbumin. Left voicemail for patient to return my call at [phone].  Nicholes Rough, CMA Care Guide [employer]

## 2023-10-11 NOTE — Telephone Encounter (Signed)
Left VM for patient

## 2023-10-11 NOTE — Telephone Encounter (Signed)
Patient called stating that she was here last week and had an injection in her wrist for her left hand. Her hand and fingers are still tingly and a burning feeling. She asked if something could be sent in for her?  Please advise.

## 2023-10-12 NOTE — Telephone Encounter (Signed)
Left VM for patient. Per a verbal from Dr. Katrinka Blazing, patient is to go into ED if pain worsens and does not improve but he believes symptoms will subside within next 10 days.

## 2023-10-12 NOTE — Telephone Encounter (Signed)
Patient called back to follow up. Advised that per Dr Katrinka Blazing, it would get better She asked if someone could call her back to discuss further.

## 2023-10-18 ENCOUNTER — Other Ambulatory Visit: Payer: Self-pay | Admitting: Adult Health

## 2023-10-18 DIAGNOSIS — J302 Other seasonal allergic rhinitis: Secondary | ICD-10-CM

## 2023-10-26 DIAGNOSIS — H2513 Age-related nuclear cataract, bilateral: Secondary | ICD-10-CM | POA: Diagnosis not present

## 2023-11-01 NOTE — Progress Notes (Unsigned)
   Rubin Payor, PhD, LAT, ATC acting as a scribe for Clementeen Graham, MD.  Cathy [name] is a 77 y.o. female who presents to [facility] today for bilat hand pain. Pt was last seen by Dr. Katrinka Blazing on 10/01/23 and was given bilat CT steroid injections.  Today, pt reports ***  Radiates: Paresthesia: Grip strength: Aggravates: Treatments tried:  Pertinent review of systems: ***  Relevant historical information: ***   Exam:  There were no vitals taken for this visit. General: Well Developed, well nourished, and in no acute distress.   MSK: ***    Lab and Radiology Results No results found for this or any previous visit (from the past 72 hour(s)). No results found.     Assessment and Plan: 77 y.o. female with ***   PDMP not reviewed this encounter. No orders of the defined types were placed in this encounter.  No orders of the defined types were placed in this encounter.    Discussed warning signs or symptoms. Please see discharge instructions. Patient expresses understanding.   ***

## 2023-11-02 ENCOUNTER — Encounter: Payer: Self-pay | Admitting: Family Medicine

## 2023-11-02 ENCOUNTER — Ambulatory Visit (INDEPENDENT_AMBULATORY_CARE_PROVIDER_SITE_OTHER): Payer: 59 | Admitting: Family Medicine

## 2023-11-02 ENCOUNTER — Other Ambulatory Visit: Payer: Self-pay

## 2023-11-02 ENCOUNTER — Other Ambulatory Visit: Payer: Self-pay | Admitting: Adult Health

## 2023-11-02 VITALS — BP 136/76 | HR 90 | Ht 63.0 in | Wt 148.0 lb

## 2023-11-02 DIAGNOSIS — H8113 Benign paroxysmal vertigo, bilateral: Secondary | ICD-10-CM

## 2023-11-02 DIAGNOSIS — M79641 Pain in right hand: Secondary | ICD-10-CM

## 2023-11-02 DIAGNOSIS — G5603 Carpal tunnel syndrome, bilateral upper limbs: Secondary | ICD-10-CM | POA: Diagnosis not present

## 2023-11-02 DIAGNOSIS — M79642 Pain in left hand: Secondary | ICD-10-CM | POA: Diagnosis not present

## 2023-11-02 MED ORDER — GABAPENTIN 100 MG PO CAPS
100.0000 mg | ORAL_CAPSULE | Freq: Every evening | ORAL | 3 refills | Status: AC | PRN
Start: 2023-11-02 — End: ?

## 2023-11-02 NOTE — Patient Instructions (Addendum)
Thank you for coming in today.   I've sent a prescription for Gabapentin to your pharmacy.   Carpal tunnel wrist brace  You should hear from Neurology soon about scheduling the nerve conduction study.

## 2023-11-04 ENCOUNTER — Encounter: Payer: Self-pay | Admitting: Adult Health

## 2023-11-09 ENCOUNTER — Encounter: Payer: Self-pay | Admitting: Neurology

## 2023-11-09 ENCOUNTER — Other Ambulatory Visit: Payer: Self-pay

## 2023-11-09 DIAGNOSIS — R202 Paresthesia of skin: Secondary | ICD-10-CM

## 2023-11-22 DIAGNOSIS — M533 Sacrococcygeal disorders, not elsewhere classified: Secondary | ICD-10-CM | POA: Diagnosis not present

## 2023-11-22 DIAGNOSIS — R200 Anesthesia of skin: Secondary | ICD-10-CM | POA: Diagnosis not present

## 2023-11-30 ENCOUNTER — Telehealth: Payer: Self-pay | Admitting: Family Medicine

## 2023-11-30 NOTE — Telephone Encounter (Signed)
 Patient called stating that she is still having continued hand/wrist pain and numbness. She would not be due for another injection until the very end of January. She asked if there was something else that she could do to help at this point?

## 2023-12-01 ENCOUNTER — Other Ambulatory Visit: Payer: Self-pay | Admitting: Adult Health

## 2023-12-01 ENCOUNTER — Telehealth: Payer: Self-pay

## 2023-12-01 DIAGNOSIS — H8113 Benign paroxysmal vertigo, bilateral: Secondary | ICD-10-CM

## 2023-12-01 NOTE — Telephone Encounter (Signed)
 Copied from CRM [id number]. Topic: Clinical - Medication Refill >> Dec 01, 2023  4:12 PM Isabell A wrote: Most Recent Primary Care Visit:  Provider: TANDA ROJELIO ORN  Department: [facility]  Visit Type: MEDICARE AWV, SEQUENTIAL  Date: 09/06/2023  Medication: meclizine  (ANTIVERT ) 25 MG tablet  Has the patient contacted their pharmacy? Yes (Agent: If no, request that the patient contact the pharmacy for the refill. If patient does not wish to contact the pharmacy document the reason why and proceed with request.) (Agent: If yes, when and what did the pharmacy advise?) Pt states pharmacy told her there refill has not been sent.   Is this the correct pharmacy for this prescription? Yes If no, delete pharmacy and type the correct one.  This is the patient's preferred pharmacy:  [facility] [address], [place] - [address] [place] [address] Phone: [phone] Fax: [phone]  Has the prescription been filled recently? Yes  Is the patient out of the medication? No, 1 left.   Has the patient been seen for an appointment in the last year OR does the patient have an upcoming appointment? Yes  Can we respond through MyChart? No  Agent: Please be advised that Rx refills may take up to 3 business days. We ask that you follow-up with your pharmacy.

## 2023-12-01 NOTE — Telephone Encounter (Signed)
 Copied from CRM [id number]. Topic: Clinical - Medication Refill >> Dec 01, 2023  4:12 PM Isabell A wrote: Most Recent Primary Care Visit:  Provider: TANDA ROJELIO ORN  Department: [facility]  Visit Type: MEDICARE AWV, SEQUENTIAL  Date: 09/06/2023  Medication: ***  Has the patient contacted their pharmacy?  (Agent: If no, request that the patient contact the pharmacy for the refill. If patient does not wish to contact the pharmacy document the reason why and proceed with request.) (Agent: If yes, when and what did the pharmacy advise?)  Is this the correct pharmacy for this prescription?  If no, delete pharmacy and type the correct one.  This is the patient's preferred pharmacy:  [id number], [place] - [address] [place] [address] Phone: [phone] Fax: [phone]  [facility] [address]  - [address] [place] [address] Phone: [phone] Fax: [phone]   Has the prescription been filled recently?   Is the patient out of the medication?   Has the patient been seen for an appointment in the last year OR does the patient have an upcoming appointment?   Can we respond through MyChart?   Agent: Please be advised that Rx refills may take up to 3 business days. We ask that you follow-up with your pharmacy.

## 2023-12-01 NOTE — Telephone Encounter (Signed)
 Rx already  called in with refill. Tried to call pt to advise no answer.

## 2023-12-02 ENCOUNTER — Other Ambulatory Visit: Payer: Self-pay | Admitting: Adult Health

## 2023-12-02 DIAGNOSIS — H8113 Benign paroxysmal vertigo, bilateral: Secondary | ICD-10-CM

## 2023-12-02 NOTE — Telephone Encounter (Signed)
 Copied from CRM [id number]. Topic: Clinical - Medication Refill >> Dec 02, 2023  3:45 PM Evie B wrote: Most Recent Primary Care Visit:  Provider: TANDA ROJELIO ORN  Department: [facility]  Visit Type: MEDICARE AWV, SEQUENTIAL  Date: 09/06/2023  Medication: meclizine  (ANTIVERT ) 25 MG tablet   Has the patient contacted their pharmacy? Yes (Agent: If no, request that the patient contact the pharmacy for the refill. If patient does not wish to contact the pharmacy document the reason why and proceed with request.) (Agent: If yes, when and what did the pharmacy advise?)  Is this the correct pharmacy for this prescription? Yes If no, delete pharmacy and type the correct one.  This is the patient's preferred pharmacy:  [facility] [address] [place] - [address] [place] [address] Phone: [phone] Fax: [phone]    Has the prescription been filled recently? Yes  Is the patient out of the medication? Yes  Has the patient been seen for an appointment in the last year OR does the patient have an upcoming appointment? Yes  Can we respond through MyChart? No  Agent: Please be advised that Rx refills may take up to 3 business days. We ask that you follow-up with your pharmacy.

## 2023-12-03 DIAGNOSIS — G5603 Carpal tunnel syndrome, bilateral upper limbs: Secondary | ICD-10-CM | POA: Diagnosis not present

## 2023-12-03 DIAGNOSIS — G5601 Carpal tunnel syndrome, right upper limb: Secondary | ICD-10-CM | POA: Diagnosis not present

## 2023-12-03 DIAGNOSIS — G5602 Carpal tunnel syndrome, left upper limb: Secondary | ICD-10-CM | POA: Diagnosis not present

## 2023-12-03 MED ORDER — MECLIZINE HCL 25 MG PO TABS
ORAL_TABLET | ORAL | 1 refills | Status: DC
Start: 2023-12-03 — End: 2024-01-11

## 2023-12-10 ENCOUNTER — Encounter: Payer: 59 | Admitting: Neurology

## 2023-12-16 DIAGNOSIS — M533 Sacrococcygeal disorders, not elsewhere classified: Secondary | ICD-10-CM | POA: Diagnosis not present

## 2023-12-17 ENCOUNTER — Emergency Department (HOSPITAL_COMMUNITY): Payer: No Typology Code available for payment source

## 2023-12-17 ENCOUNTER — Encounter (HOSPITAL_COMMUNITY): Payer: Self-pay | Admitting: Emergency Medicine

## 2023-12-17 ENCOUNTER — Emergency Department (HOSPITAL_COMMUNITY)
Admission: EM | Admit: 2023-12-17 | Discharge: 2023-12-17 | Disposition: A | Discharge status: Home / Self Care | Payer: No Typology Code available for payment source | Attending: Emergency Medicine | Admitting: Emergency Medicine

## 2023-12-17 DIAGNOSIS — M25562 Pain in left knee: Secondary | ICD-10-CM | POA: Diagnosis not present

## 2023-12-17 DIAGNOSIS — Y92410 Unspecified street and highway as the place of occurrence of the external cause: Secondary | ICD-10-CM | POA: Diagnosis not present

## 2023-12-17 DIAGNOSIS — R519 Headache, unspecified: Secondary | ICD-10-CM | POA: Diagnosis not present

## 2023-12-17 DIAGNOSIS — Z7982 Long term (current) use of aspirin: Secondary | ICD-10-CM | POA: Insufficient documentation

## 2023-12-17 DIAGNOSIS — M542 Cervicalgia: Secondary | ICD-10-CM | POA: Diagnosis not present

## 2023-12-17 DIAGNOSIS — R9082 White matter disease, unspecified: Secondary | ICD-10-CM | POA: Diagnosis not present

## 2023-12-17 DIAGNOSIS — Z96652 Presence of left artificial knee joint: Secondary | ICD-10-CM | POA: Diagnosis not present

## 2023-12-17 DIAGNOSIS — R0789 Other chest pain: Secondary | ICD-10-CM | POA: Diagnosis not present

## 2023-12-17 DIAGNOSIS — I6789 Other cerebrovascular disease: Secondary | ICD-10-CM | POA: Diagnosis not present

## 2023-12-17 DIAGNOSIS — Z743 Need for continuous supervision: Secondary | ICD-10-CM | POA: Diagnosis not present

## 2023-12-17 DIAGNOSIS — R6889 Other general symptoms and signs: Secondary | ICD-10-CM | POA: Diagnosis not present

## 2023-12-17 DIAGNOSIS — M25561 Pain in right knee: Secondary | ICD-10-CM | POA: Diagnosis not present

## 2023-12-17 DIAGNOSIS — S0990XA Unspecified injury of head, initial encounter: Secondary | ICD-10-CM | POA: Diagnosis not present

## 2023-12-17 DIAGNOSIS — S199XXA Unspecified injury of neck, initial encounter: Secondary | ICD-10-CM | POA: Diagnosis not present

## 2023-12-17 DIAGNOSIS — Z471 Aftercare following joint replacement surgery: Secondary | ICD-10-CM | POA: Diagnosis not present

## 2023-12-17 DIAGNOSIS — I499 Cardiac arrhythmia, unspecified: Secondary | ICD-10-CM | POA: Diagnosis not present

## 2023-12-17 MED ORDER — IBUPROFEN 200 MG PO TABS
400.0000 mg | ORAL_TABLET | Freq: Once | ORAL | Status: DC
  Filled 2023-12-17: qty 2

## 2023-12-17 MED ORDER — ACETAMINOPHEN 325 MG PO TABS
650.0000 mg | ORAL_TABLET | Freq: Once | ORAL | Status: DC
Start: 2023-12-17 — End: 2023-12-17
  Filled 2023-12-17: qty 2

## 2023-12-17 NOTE — ED Notes (Signed)
Pt at xray
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
## 2023-12-20 ENCOUNTER — Ambulatory Visit: Payer: 59 | Admitting: Family Medicine

## 2023-12-21 ENCOUNTER — Encounter: Payer: Self-pay | Admitting: Adult Health

## 2023-12-21 ENCOUNTER — Ambulatory Visit (INDEPENDENT_AMBULATORY_CARE_PROVIDER_SITE_OTHER): Payer: 59 | Admitting: Adult Health

## 2023-12-21 VITALS — BP 150/80 | HR 82 | Temp 97.7°F | Ht 63.0 in | Wt 151.0 lb

## 2023-12-21 DIAGNOSIS — I10 Essential (primary) hypertension: Secondary | ICD-10-CM

## 2023-12-21 DIAGNOSIS — M549 Dorsalgia, unspecified: Secondary | ICD-10-CM

## 2023-12-21 DIAGNOSIS — M542 Cervicalgia: Secondary | ICD-10-CM

## 2023-12-21 MED ORDER — METHOCARBAMOL 750 MG PO TABS: 750.0000 mg | ORAL_TABLET | Freq: Three times a day (TID) | ORAL | 0 refills | Status: DC | PRN

## 2023-12-21 MED ORDER — AMLODIPINE BESYLATE 5 MG PO TABS: 5.0000 mg | ORAL_TABLET | Freq: Every day | ORAL | 1 refills | Status: DC
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
## 2023-12-21 NOTE — Patient Instructions (Signed)
I am going to have you stop lisinpril and start you on a blood pressure medication called Norvasc

## 2023-12-22 ENCOUNTER — Ambulatory Visit: Payer: Self-pay | Admitting: Adult Health

## 2023-12-22 NOTE — Telephone Encounter (Signed)
Tried to call pt to advise on message below but no answer. Phone vm full.

## 2023-12-22 NOTE — Telephone Encounter (Addendum)
Per phone note below " Patient would like to know if she should go back to taking her lisinopril or if she should take an additional amlodipine. Patient was seen yesterday by NP Nafziger and was taken off lisinopril and switched to 5mg  amlodipine. Patient states she took her first dose this morning around 0700 and just checked her BP and it was 164/78 with a slight headache. Patient denies any cardiac or neurological symptoms."   Please advise

## 2023-12-22 NOTE — Telephone Encounter (Signed)
  Chief Complaint: elevated BP reading, had her BP medication switched at office visit yesterday. Symptoms: slight headache, hypertension Frequency: x today. Pertinent Negatives: Patient denies blurred vision, chest pain, numbness or weakness, SOB. Disposition: [] ED /[] Urgent Care (no appt availability in office) / [] Appointment(In office/virtual)/ []  [facility]/ [] Home Care/ [] Refused Recommended Disposition /[] [facility]/ [x]  Follow-up with PCP Additional Notes: Patient would like to know if she should go back to taking her lisinopril or if she should take an additional amlodipine. Patient was seen yesterday by NP Nafziger and was taken off lisinopril and switched to 5mg  amlodipine. Patient states she took her first dose this morning around 0700 and just checked her BP and it was 164/78 with a slight headache. Patient denies any cardiac or neurological symptoms.  Copied from CRM [id number]. Topic: Clinical - Red Word Triage >> Dec 22, 2023  3:04 PM Gurney Maxin H wrote: [facility] that prompted transfer to Nurse Triage: Started taking amLODipine (NORVASC) 5 MG tablet blood pressure pills as of yesterday and wants to know how long does it take medication to get in system, 164/78 Reason for Disposition  Systolic BP  >= 160 OR Diastolic >= 100  Answer Assessment - Initial Assessment Questions 1. BLOOD PRESSURE: "What is the blood pressure?" "Did you take at least two measurements 5 minutes apart?"     164/ 78.  2. ONSET: "When did you take your blood pressure?"     5 minutes prior to calling.  3. HOW: "How did you take your blood pressure?" (e.g., automatic home BP monitor, visiting nurse)     Automatic home BP arm monitor.  4. HISTORY: "Do you have a history of high blood pressure?"     Yes.  5. MEDICINES: "Are you taking any medicines for blood pressure?" "Have you missed any doses recently?"     Amlodipine 5mg  tablet (she states she just started taking it today).  6.  OTHER SYMPTOMS: "Do you have any symptoms?" (e.g., blurred vision, chest pain, difficulty breathing, headache, weakness)     Headache.  Protocols used: Blood Pressure - High-A-AH

## 2023-12-23 NOTE — Telephone Encounter (Signed)
Patient notified of update  and verbalized understanding.

## 2023-12-29 ENCOUNTER — Ambulatory Visit: Payer: 59 | Admitting: Family Medicine

## 2024-01-03 DIAGNOSIS — M25552 Pain in left hip: Secondary | ICD-10-CM | POA: Diagnosis not present

## 2024-01-03 DIAGNOSIS — M25562 Pain in left knee: Secondary | ICD-10-CM | POA: Diagnosis not present

## 2024-01-04 DIAGNOSIS — G629 Polyneuropathy, unspecified: Secondary | ICD-10-CM | POA: Diagnosis not present

## 2024-01-04 DIAGNOSIS — G5603 Carpal tunnel syndrome, bilateral upper limbs: Secondary | ICD-10-CM | POA: Diagnosis not present

## 2024-01-07 ENCOUNTER — Other Ambulatory Visit: Payer: Self-pay | Admitting: Adult Health

## 2024-01-07 DIAGNOSIS — H8113 Benign paroxysmal vertigo, bilateral: Secondary | ICD-10-CM

## 2024-01-20 ENCOUNTER — Other Ambulatory Visit: Payer: Self-pay | Admitting: Adult Health

## 2024-01-20 ENCOUNTER — Telehealth (INDEPENDENT_AMBULATORY_CARE_PROVIDER_SITE_OTHER): Payer: 59 | Admitting: Adult Health

## 2024-01-20 ENCOUNTER — Ambulatory Visit: Payer: 59 | Admitting: Adult Health

## 2024-01-20 DIAGNOSIS — I10 Essential (primary) hypertension: Secondary | ICD-10-CM

## 2024-01-20 MED ORDER — OLMESARTAN MEDOXOMIL 20 MG PO TABS: 20.0000 mg | ORAL_TABLET | Freq: Every day | ORAL | 0 refills | Status: DC
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
## 2024-01-20 NOTE — Progress Notes (Signed)
 Virtual Visit via Video Note  I connected with Cathy [name] on 01/20/24 at  3:00 PM EST by a video enabled telemedicine application and verified that I am speaking with the correct person using two identifiers.  Location patient: home Location provider:work or home office Persons participating in the virtual visit: patient, provider  I discussed the limitations of evaluation and management by telemedicine and the availability of in person appointments. The patient expressed understanding and agreed to proceed.   HPI: 78 year old female who  has a past medical history of Chronic constipation, DJD (degenerative joint disease), Female cystocele, GERD (gastroesophageal reflux disease), History of esophagitis, History of left inguinal hernia, History of MRSA infection, History of recurrent UTIs, Hyperlipidemia, Hypertension, Pre-diabetes, and Urgency of urination.  She is being evaluated today for 1 month follow-up regarding hypertension.  During her visit a month ago she mentioned that she had had a dry cough for a few years but did not think anything of it.  A friend of hers told her that lisinopril could cause a dry cough so she decided that she wanted to come off of this medication.  I placed her on Norvasc 5 mg daily, she reports that soon after starting this medication that she had some mild lower extremity edema.  She did continue the medication.  At home her blood pressures have been in the 130s to 150s but mostly in the 150 systolic range.  Her cough has resolved.  ROS: See pertinent positives and negatives per HPI.  Past Medical History:  Diagnosis Date   Chronic constipation    DJD (degenerative joint disease)    knees   Female cystocele    GERD (gastroesophageal reflux disease)    History of esophagitis    History of left inguinal hernia    History of MRSA infection    recurrent carbuncle   History of recurrent UTIs    Hyperlipidemia    Hypertension    Pre-diabetes    diet  controlled   Urgency of urination     Past Surgical History:  Procedure Laterality Date   APPENDECTOMY  1980's   BREAST EXCISIONAL BIOPSY Left 1989   COLONOSCOPY  last one 06-18-2015   INGUINAL HERNIA REPAIR Left 05-10-2001   LAPAROSCOPIC LYSIS OF ADHESIONS  01/17/2019   OOPHORECTOMY Right 01/17/2019   PERINEOPLASTY  01/17/2019   SALPINGECTOMY Left 01/17/2019   TOTAL HIP ARTHROPLASTY Right 04/09/2020   Procedure: RIGH TOTAL HIP ARTHROPLASTY ANTERIOR APPROACH;  Surgeon: Marcene Corning, MD;  Location: [facility];  Service: Orthopedics;  Laterality: Right;   TOTAL HIP ARTHROPLASTY Left 01/28/2021   Procedure: LEFT TOTAL HIP ARTHROPLASTY ANTERIOR APPROACH;  Surgeon: Marcene Corning, MD;  Location: [facility];  Service: Orthopedics;  Laterality: Left;   TOTAL KNEE ARTHROPLASTY Left 07/20/2023   Procedure: LEFT TOTAL KNEE ARTHROPLASTY;  Surgeon: Marcene Corning, MD;  Location: [facility];  Service: Orthopedics;  Laterality: Left;   VAGINAL HYSTERECTOMY  1980's   VAGINAL PROLAPSE REPAIR N/A 03/02/2016   Procedure: COLOPLAST ANTERIOR  VAULT REPAIR WITH AXIS DERMIS. SACROSPINUS FIXATION, AUGMENTATION WITH AXIS DERMIS;  Surgeon: Jethro Bolus, MD;  Location: [facility];  Service: Urology;  Laterality: N/A;    Family History  Problem Relation Age of Onset   Aneurysm Mother 59       deceased secondary to brain aneurysm   Liver cancer Father 46       deceased   Diabetes Other        grandmother  Colon cancer Neg Hx    Stomach cancer Neg Hx    Rectal cancer Neg Hx    Esophageal cancer Neg Hx    Breast cancer Neg Hx        Current Outpatient Medications:    amLODipine (NORVASC) 5 MG tablet, Take 1 tablet (5 mg total) by mouth daily., Disp: 30 tablet, Rfl: 1   Ascorbic Acid (VITAMIN C) 1000 MG tablet, Take 1,000 mg by mouth daily., Disp: , Rfl:    aspirin 81 MG tablet, Take 1 tablet (81 mg total) by mouth 2 (two) times daily after a meal., Disp: 60 tablet, Rfl: 0   b complex  vitamins capsule, Take 1 capsule by mouth daily., Disp: , Rfl:    benzonatate (TESSALON) 100 MG capsule, Take 1 capsule (100 mg total) by mouth every 8 (eight) hours., Disp: 21 capsule, Rfl: 0   Biotin 1000 MCG tablet, Take 1,000 mcg by mouth daily., Disp: , Rfl:    Blood Glucose Monitoring Suppl (ONETOUCH VERIO FLEX SYSTEM) w/Device KIT, TEST TWICE DAILY, Disp: 1 kit, Rfl: 0   Calcium Carbonate-Vitamin D 600-400 MG-UNIT tablet, Take 1 tablet by mouth daily., Disp: , Rfl:    Chromium 1000 MCG TABS, Take 1,000 mcg by mouth daily., Disp: , Rfl:    Cinnamon 500 MG capsule, Take 1,000 mg by mouth daily., Disp: , Rfl:    CRANBERRY PO, Take 2 tablets by mouth daily. 4200 mg, Disp: , Rfl:    Cyanocobalamin (VITAMIN B-12) 2500 MCG SUBL, Place 2,500 mcg under the tongue daily., Disp: , Rfl:    DULoxetine (CYMBALTA) 20 MG capsule, TAKE 1 CAPSULE(20 MG) BY MOUTH DAILY, Disp: 90 capsule, Rfl: 0   ELDERBERRY PO, Take 1 capsule by mouth daily., Disp: , Rfl:    estradiol (ESTRACE) 0.1 MG/GM vaginal cream, Place 1 Applicatorful vaginally every other day. In the evening, Disp: , Rfl: 3   fluticasone (FLONASE) 50 MCG/ACT nasal spray, SHAKE LIQUID AND USE 2 SPRAYS IN EACH NOSTRIL DAILY, Disp: 16 g, Rfl: 6   gabapentin (NEURONTIN) 100 MG capsule, Take 1-3 capsules (100-300 mg total) by mouth at bedtime as needed., Disp: 30 capsule, Rfl: 3   glucose blood (ONETOUCH VERIO) test strip, 1 each by Other route 2 (two) times daily. Use as instructed, Disp: 200 strip, Rfl: 3   Lancets (ONETOUCH DELICA PLUS LANCET33G) MISC, USE ONCE DAILY, Disp: 100 each, Rfl: 3   meclizine (ANTIVERT) 25 MG tablet, TAKE 1 TABLET(25 MG) BY MOUTH THREE TIMES DAILY AS NEEDED FOR DIZZINESS, Disp: 30 tablet, Rfl: 1   methocarbamol (ROBAXIN-750) 750 MG tablet, Take 1 tablet (750 mg total) by mouth every 8 (eight) hours as needed for muscle spasms., Disp: 10 tablet, Rfl: 0   Misc Natural Products (TART CHERRY ADVANCED) CAPS, Take 2 capsules by mouth  daily., Disp: , Rfl:    Multiple Vitamins-Minerals (MULTIVITAMIN WITH MINERALS) tablet, Take 1 tablet by mouth daily. Centrum silver, Disp: , Rfl:    MYRBETRIQ 25 MG TB24 tablet, Take 25 mg by mouth at bedtime., Disp: , Rfl:    OVER THE COUNTER MEDICATION, Take 1 tablet by mouth daily as needed (constipation). Vital Lax, Disp: , Rfl:    polyethylene glycol (MIRALAX / GLYCOLAX) packet, Take 17 g by mouth daily as needed for mild constipation., Disp: , Rfl:    rosuvastatin (CRESTOR) 40 MG tablet, Take 1 tablet (40 mg total) by mouth daily., Disp: 90 tablet, Rfl: 3   tiZANidine (ZANAFLEX) 2 MG tablet, Take 1  tablet (2 mg total) by mouth at bedtime., Disp: 30 tablet, Rfl: 0   Turmeric 500 MG CAPS, Take 1,000 mg by mouth daily. , Disp: , Rfl:   EXAM:  VITALS per patient if applicable:  GENERAL: alert, oriented, appears well and in no acute distress  HEENT: atraumatic, conjunttiva clear, no obvious abnormalities on inspection of external nose and ears  NECK: normal movements of the head and neck  LUNGS: on inspection no signs of respiratory distress, breathing rate appears normal, no obvious gross SOB, gasping or wheezing  CV: no obvious cyanosis  MS: moves all visible extremities without noticeable abnormality  PSYCH/NEURO: pleasant and cooperative, no obvious depression or anxiety, speech and thought processing grossly intact  ASSESSMENT AND PLAN:  Discussed the following assessment and plan:  1. Essential hypertension (Primary) -Will have her stop Norvasc and I will place her on olmesartan 20 mg daily.  We will follow-up in 30 days.  Continue to monitor blood pressure at home - olmesartan (BENICAR) 20 MG tablet; Take 1 tablet (20 mg total) by mouth daily.  Dispense: 30 tablet; Refill: 0      I discussed the assessment and treatment plan with the patient. The patient was provided an opportunity to ask questions and all were answered. The patient agreed with the plan and demonstrated  an understanding of the instructions.   The patient was advised to call back or seek an in-person evaluation if the symptoms worsen or if the condition fails to improve as anticipated.   Shirline Frees, NP

## 2024-01-25 ENCOUNTER — Other Ambulatory Visit: Payer: Self-pay

## 2024-01-25 ENCOUNTER — Ambulatory Visit: Payer: 59 | Admitting: Family Medicine

## 2024-01-25 ENCOUNTER — Encounter: Payer: Self-pay | Admitting: Family Medicine

## 2024-01-25 VITALS — BP 122/62 | HR 89 | Ht 63.0 in | Wt 148.0 lb

## 2024-01-25 DIAGNOSIS — M25532 Pain in left wrist: Secondary | ICD-10-CM

## 2024-01-25 DIAGNOSIS — M1711 Unilateral primary osteoarthritis, right knee: Secondary | ICD-10-CM | POA: Diagnosis not present

## 2024-01-25 DIAGNOSIS — E118 Type 2 diabetes mellitus with unspecified complications: Secondary | ICD-10-CM | POA: Diagnosis not present

## 2024-01-25 DIAGNOSIS — M79642 Pain in left hand: Secondary | ICD-10-CM | POA: Diagnosis not present

## 2024-01-25 DIAGNOSIS — M25531 Pain in right wrist: Secondary | ICD-10-CM | POA: Diagnosis not present

## 2024-01-25 DIAGNOSIS — G5603 Carpal tunnel syndrome, bilateral upper limbs: Secondary | ICD-10-CM

## 2024-01-25 DIAGNOSIS — M79641 Pain in right hand: Secondary | ICD-10-CM

## 2024-01-25 NOTE — Assessment & Plan Note (Signed)
 Chronic, with exacerbation.  Patient wants to do everything in her power to avoid any type of surgical intervention with patient having difficulty.  Discussed with patient that we will continue to monitor.  Could be a potential candidate for viscosupplementation again.  Discussed icing regimen.  Will follow-up again 3 months

## 2024-01-25 NOTE — Assessment & Plan Note (Signed)
 Well-controlled, will monitor with patient having the injections today.  Patient knows signs and symptoms and when to seek medical attention

## 2024-01-25 NOTE — Patient Instructions (Addendum)
 Good to see you. Injected bilateral wrist and knee today. Return in 3 months.
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
## 2024-01-26 ENCOUNTER — Telehealth: Payer: Self-pay | Admitting: Family Medicine

## 2024-01-26 NOTE — Telephone Encounter (Signed)
 Patient called to ask how long it will take for her fingers to not be numb. Patient called to ask what she can get to rub on her fingers or is there anything he can give her. Patient asked if someone could call her.

## 2024-01-26 NOTE — Telephone Encounter (Signed)
 Patient called asking how long it might take for the pain in her hand to feel better and would like to know if Dr Katrinka Blazing could send in something to help with the pain?  Please advise.

## 2024-02-12 ENCOUNTER — Other Ambulatory Visit: Payer: Self-pay | Admitting: Adult Health

## 2024-02-12 DIAGNOSIS — I10 Essential (primary) hypertension: Secondary | ICD-10-CM

## 2024-02-15 NOTE — Telephone Encounter (Signed)
  The original prescription was discontinued on 01/20/2024 by Shirline Frees, NP. Renewing this prescription may not be appropriate.

## 2024-02-22 ENCOUNTER — Telehealth (INDEPENDENT_AMBULATORY_CARE_PROVIDER_SITE_OTHER): Admitting: Adult Health

## 2024-02-22 VITALS — Wt 148.0 lb

## 2024-02-22 DIAGNOSIS — R051 Acute cough: Secondary | ICD-10-CM

## 2024-02-22 DIAGNOSIS — J0100 Acute maxillary sinusitis, unspecified: Secondary | ICD-10-CM | POA: Diagnosis not present

## 2024-02-22 MED ORDER — DOXYCYCLINE HYCLATE 100 MG PO CAPS: 100.0000 mg | ORAL_CAPSULE | Freq: Two times a day (BID) | ORAL | 0 refills | Status: DC

## 2024-02-22 MED ORDER — BENZONATATE 200 MG PO CAPS: 200.0000 mg | ORAL_CAPSULE | Freq: Three times a day (TID) | ORAL | 1 refills | Status: DC | PRN

## 2024-02-22 NOTE — Progress Notes (Signed)
 Virtual Visit via Video Note  I connected with Cathy [name] on 02/22/24 at  8:30 AM EDT by a video enabled telemedicine application and verified that I am speaking with the correct person using two identifiers.  Location patient: home Location provider:work or home office Persons participating in the virtual visit: patient, provider  I discussed the limitations of evaluation and management by telemedicine and the availability of in person appointments. The patient expressed understanding and agreed to proceed.   HPI: She is being evaluated today for an acute issue.  She reports for the last 7 to 10 days she has had sinus pressure, discolored nasal drainage, headaches, and a semiproductive cough.  She has been using Zyrtec at home.  She denies fevers, chills, shortness of breath, or wheezing.   ROS: See pertinent positives and negatives per HPI.  Past Medical History:  Diagnosis Date   Chronic constipation    DJD (degenerative joint disease)    knees   Female cystocele    GERD (gastroesophageal reflux disease)    History of esophagitis    History of left inguinal hernia    History of MRSA infection    recurrent carbuncle   History of recurrent UTIs    Hyperlipidemia    Hypertension    Pre-diabetes    diet controlled   Urgency of urination     Past Surgical History:  Procedure Laterality Date   APPENDECTOMY  1980's   BREAST EXCISIONAL BIOPSY Left 1989   COLONOSCOPY  last one 06-18-2015   INGUINAL HERNIA REPAIR Left 05-10-2001   LAPAROSCOPIC LYSIS OF ADHESIONS  01/17/2019   OOPHORECTOMY Right 01/17/2019   PERINEOPLASTY  01/17/2019   SALPINGECTOMY Left 01/17/2019   TOTAL HIP ARTHROPLASTY Right 04/09/2020   Procedure: RIGH TOTAL HIP ARTHROPLASTY ANTERIOR APPROACH;  Surgeon: Marcene Corning, MD;  Location: [facility];  Service: Orthopedics;  Laterality: Right;   TOTAL HIP ARTHROPLASTY Left 01/28/2021   Procedure: LEFT TOTAL HIP ARTHROPLASTY ANTERIOR APPROACH;  Surgeon: Marcene Corning, MD;  Location: [facility];  Service: Orthopedics;  Laterality: Left;   TOTAL KNEE ARTHROPLASTY Left 07/20/2023   Procedure: LEFT TOTAL KNEE ARTHROPLASTY;  Surgeon: Marcene Corning, MD;  Location: [facility];  Service: Orthopedics;  Laterality: Left;   VAGINAL HYSTERECTOMY  1980's   VAGINAL PROLAPSE REPAIR N/A 03/02/2016   Procedure: COLOPLAST ANTERIOR  VAULT REPAIR WITH AXIS DERMIS. SACROSPINUS FIXATION, AUGMENTATION WITH AXIS DERMIS;  Surgeon: Jethro Bolus, MD;  Location: [facility];  Service: Urology;  Laterality: N/A;    Family History  Problem Relation Age of Onset   Aneurysm Mother 39       deceased secondary to brain aneurysm   Liver cancer Father 75       deceased   Diabetes Other        grandmother   Colon cancer Neg Hx    Stomach cancer Neg Hx    Rectal cancer Neg Hx    Esophageal cancer Neg Hx    Breast cancer Neg Hx        Current Outpatient Medications:    Ascorbic Acid (VITAMIN C) 1000 MG tablet, Take 1,000 mg by mouth daily., Disp: , Rfl:    aspirin 81 MG tablet, Take 1 tablet (81 mg total) by mouth 2 (two) times daily after a meal., Disp: 60 tablet, Rfl: 0   b complex vitamins capsule, Take 1 capsule by mouth daily., Disp: , Rfl:    Biotin 1000 MCG tablet, Take 1,000 mcg by mouth daily., Disp: ,  Rfl:    Blood Glucose Monitoring Suppl (ONETOUCH VERIO FLEX SYSTEM) w/Device KIT, TEST TWICE DAILY, Disp: 1 kit, Rfl: 0   Calcium Carbonate-Vitamin D 600-400 MG-UNIT tablet, Take 1 tablet by mouth daily., Disp: , Rfl:    Chromium 1000 MCG TABS, Take 1,000 mcg by mouth daily., Disp: , Rfl:    Cinnamon 500 MG capsule, Take 1,000 mg by mouth daily., Disp: , Rfl:    CRANBERRY PO, Take 2 tablets by mouth daily. 4200 mg, Disp: , Rfl:    DULoxetine (CYMBALTA) 20 MG capsule, TAKE 1 CAPSULE(20 MG) BY MOUTH DAILY, Disp: 90 capsule, Rfl: 0   ELDERBERRY PO, Take 1 capsule by mouth daily., Disp: , Rfl:    estradiol (ESTRACE) 0.1 MG/GM vaginal cream, Place 1  Applicatorful vaginally every other day. In the evening, Disp: , Rfl: 3   fluticasone (FLONASE) 50 MCG/ACT nasal spray, SHAKE LIQUID AND USE 2 SPRAYS IN EACH NOSTRIL DAILY, Disp: 16 g, Rfl: 6   gabapentin (NEURONTIN) 100 MG capsule, Take 1-3 capsules (100-300 mg total) by mouth at bedtime as needed., Disp: 30 capsule, Rfl: 3   glucose blood (ONETOUCH VERIO) test strip, 1 each by Other route 2 (two) times daily. Use as instructed, Disp: 200 strip, Rfl: 3   Lancets (ONETOUCH DELICA PLUS LANCET33G) MISC, USE ONCE DAILY, Disp: 100 each, Rfl: 3   meclizine (ANTIVERT) 25 MG tablet, TAKE 1 TABLET(25 MG) BY MOUTH THREE TIMES DAILY AS NEEDED FOR DIZZINESS, Disp: 30 tablet, Rfl: 1   methocarbamol (ROBAXIN-750) 750 MG tablet, Take 1 tablet (750 mg total) by mouth every 8 (eight) hours as needed for muscle spasms., Disp: 10 tablet, Rfl: 0   Misc Natural Products (TART CHERRY ADVANCED) CAPS, Take 2 capsules by mouth daily., Disp: , Rfl:    Multiple Vitamins-Minerals (MULTIVITAMIN WITH MINERALS) tablet, Take 1 tablet by mouth daily. Centrum silver, Disp: , Rfl:    MYRBETRIQ 25 MG TB24 tablet, Take 25 mg by mouth at bedtime., Disp: , Rfl:    olmesartan (BENICAR) 20 MG tablet, TAKE 1 TABLET(20 MG) BY MOUTH DAILY, Disp: 90 tablet, Rfl: 1   OVER THE COUNTER MEDICATION, Take 1 tablet by mouth daily as needed (constipation). Vital Lax, Disp: , Rfl:    polyethylene glycol (MIRALAX / GLYCOLAX) packet, Take 17 g by mouth daily as needed for mild constipation., Disp: , Rfl:    rosuvastatin (CRESTOR) 40 MG tablet, Take 1 tablet (40 mg total) by mouth daily., Disp: 90 tablet, Rfl: 3   Turmeric 500 MG CAPS, Take 1,000 mg by mouth daily. , Disp: , Rfl:   EXAM:  VITALS per patient if applicable:  GENERAL: alert, oriented, appears well and in no acute distress  HEENT: atraumatic, conjunttiva clear, no obvious abnormalities on inspection of external nose and ears  NECK: normal movements of the head and neck  LUNGS: on  inspection no signs of respiratory distress, breathing rate appears normal, no obvious gross SOB, gasping or wheezing  CV: no obvious cyanosis  MS: moves all visible extremities without noticeable abnormality  PSYCH/NEURO: pleasant and cooperative, no obvious depression or anxiety, speech and thought processing grossly intact  ASSESSMENT AND PLAN:  Discussed the following assessment and plan:  1. Acute non-recurrent maxillary sinusitis (Primary) - Will treat due to symptoms and duration. Follow up if not resolved by the end of abx therapy or sooner if symptoms worsen  - doxycycline (VIBRAMYCIN) 100 MG capsule; Take 1 capsule (100 mg total) by mouth 2 (two) times daily.  Dispense: 14 capsule; Refill: 0  2. Acute cough  - benzonatate (TESSALON) 200 MG capsule; Take 1 capsule (200 mg total) by mouth 3 (three) times daily as needed.  Dispense: 30 capsule; Refill: 1      I discussed the assessment and treatment plan with the patient. The patient was provided an opportunity to ask questions and all were answered. The patient agreed with the plan and demonstrated an understanding of the instructions.   The patient was advised to call back or seek an in-person evaluation if the symptoms worsen or if the condition fails to improve as anticipated.   Shirline Frees, NP

## 2024-02-25 DIAGNOSIS — M1712 Unilateral primary osteoarthritis, left knee: Secondary | ICD-10-CM | POA: Diagnosis not present

## 2024-02-25 DIAGNOSIS — M25552 Pain in left hip: Secondary | ICD-10-CM | POA: Diagnosis not present

## 2024-03-14 ENCOUNTER — Other Ambulatory Visit: Payer: Self-pay | Admitting: Adult Health

## 2024-03-14 ENCOUNTER — Ambulatory Visit (INDEPENDENT_AMBULATORY_CARE_PROVIDER_SITE_OTHER): Admitting: Adult Health

## 2024-03-14 VITALS — BP 138/70 | HR 81 | Temp 97.9°F | Wt 151.0 lb

## 2024-03-14 DIAGNOSIS — M150 Primary generalized (osteo)arthritis: Secondary | ICD-10-CM | POA: Diagnosis not present

## 2024-03-14 DIAGNOSIS — E119 Type 2 diabetes mellitus without complications: Secondary | ICD-10-CM | POA: Diagnosis not present

## 2024-03-14 DIAGNOSIS — N3281 Overactive bladder: Secondary | ICD-10-CM | POA: Diagnosis not present

## 2024-03-14 DIAGNOSIS — R350 Frequency of micturition: Secondary | ICD-10-CM | POA: Diagnosis not present

## 2024-03-14 DIAGNOSIS — H8113 Benign paroxysmal vertigo, bilateral: Secondary | ICD-10-CM | POA: Diagnosis not present

## 2024-03-14 DIAGNOSIS — I10 Essential (primary) hypertension: Secondary | ICD-10-CM

## 2024-03-14 DIAGNOSIS — E785 Hyperlipidemia, unspecified: Secondary | ICD-10-CM

## 2024-03-14 DIAGNOSIS — Z0000 Encounter for general adult medical examination without abnormal findings: Secondary | ICD-10-CM

## 2024-03-14 LAB — POCT URINALYSIS DIPSTICK
Bilirubin, UA: NEGATIVE
Blood, UA: NEGATIVE
Glucose, UA: NEGATIVE
Ketones, UA: NEGATIVE
Nitrite, UA: POSITIVE
Protein, UA: NEGATIVE
Spec Grav, UA: 1.01 (ref 1.010–1.025)
Urobilinogen, UA: 0.2 U/dL
pH, UA: 6 (ref 5.0–8.0)

## 2024-03-14 MED ORDER — MECLIZINE HCL 25 MG PO TABS: ORAL_TABLET | ORAL | 1 refills | Status: DC

## 2024-03-14 NOTE — Progress Notes (Signed)
 Subjective:    Patient ID: Cathy [name], female    DOB: 04-08-1946, 78 y.o.   MRN: [id number]  HPI Patient presents for yearly preventative medicine examination. She is a pleasant 78 year old female who  has a past medical history of Chronic constipation, DJD (degenerative joint disease), Female cystocele, GERD (gastroesophageal reflux disease), History of esophagitis, History of left inguinal hernia, History of MRSA infection, History of recurrent UTIs, Hyperlipidemia, Hypertension, Pre-diabetes, and Urgency of urination.  Diabetes mellitus-currently diet controlled.  She does not monitor her blood sugars at home on a routine basis but when she does her blood sugar readings are 120 or below.  She denies hypoglycemic events Lab Results  Component Value Date   HGBA1C 6.4 (A) 02/25/2023   HGBA1C 6.7 (H) 08/26/2022   HGBA1C 6.5 08/13/2021   Hypertension-managed with Benicar  20 mg daily.   She denies dizziness, lightheadedness, chest pain, or shortness of breath BP Readings from Last 3 Encounters:  03/14/24 138/70  01/25/24 122/62  12/21/23 (!) 150/80   Hyperlipidemia-currently prescribed Crestor  40 mg daily.  She denies myalgia or fatigue Lab Results  Component Value Date   CHOL 174 08/26/2022   HDL 71.40 08/26/2022   LDLCALC 89 08/26/2022   LDLDIRECT 118.2 11/21/2013   TRIG 67.0 08/26/2022   CHOLHDL 2 08/26/2022    Chronic osteoarthritis-history of bilateral total hip replacements in 2021 and 2022.  She continues to have chronic pain in both knees and back.  She does receive epidurals periodically for lumbar radiculopathy.  He uses Tizanidine  2 mg QHS as needed  OAB - managed by GYN with Myrbetriq  25 mg daily. She reports that her GYN has left and she would like to be referred to a new GYN.   Concern for a UTI - she reports that for the last week she has had frequent urination, mild dysuria, and urgency.   BPPV - uses meclizine  PRN. She would like to have a refill on hand    All immunizations and health maintenance protocols were reviewed with the patient and needed orders were placed.  Appropriate screening laboratory values were ordered for the patient including screening of hyperlipidemia, renal function and hepatic function.   Medication reconciliation,  past medical history, social history, problem list and allergies were reviewed in detail with the patient  Goals were established with regard to weight loss, exercise, and  diet in compliance with medications   Review of Systems  Constitutional: Negative.   HENT: Negative.    Eyes: Negative.   Respiratory: Negative.    Cardiovascular: Negative.   Gastrointestinal: Negative.   Endocrine: Negative.   Genitourinary:  Positive for dysuria, frequency and urgency. Negative for flank pain and hematuria.  Musculoskeletal:  Positive for arthralgias and back pain.  Skin: Negative.   Allergic/Immunologic: Negative.   Neurological:  Positive for dizziness.  Hematological: Negative.   Psychiatric/Behavioral: Negative.     Past Medical History:  Diagnosis Date   Chronic constipation    DJD (degenerative joint disease)    knees   Female cystocele    GERD (gastroesophageal reflux disease)    History of esophagitis    History of left inguinal hernia    History of MRSA infection    recurrent carbuncle   History of recurrent UTIs    Hyperlipidemia    Hypertension    Pre-diabetes    diet controlled   Urgency of urination     Social History   Socioeconomic History   Marital status: Single  Spouse name: Not on file   Number of children: Not on file   Years of education: Not on file   Highest education level: Not on file  Occupational History   Not on file  Tobacco Use   Smoking status: Former    Current packs/day: 0.00    Types: Cigarettes    Start date: 02/28/1995    Quit date: 02/28/1996    Years since quitting: 28.0   Smokeless tobacco: Never  Vaping Use   Vaping status: Never Used   Substance and Sexual Activity   Alcohol use: Yes    Alcohol/week: 4.0 standard drinks of alcohol    Types: 4 Standard drinks or equivalent per week    Comment: on weekends   Drug use: No   Sexual activity: Yes    Birth control/protection: Surgical  Other Topics Concern   Not on file  Social History Narrative   Single   Former Smoker  -  quit 10 to 11 years ago (light smoker)   Alcohol use-yes     2 children    Occupation: [employer]    Social Drivers of Health   Financial Resource Strain: Low Risk  (09/06/2023)   Overall Financial Resource Strain (CARDIA)    Difficulty of Paying Living Expenses: Not hard at all  Food Insecurity: No Food Insecurity (09/06/2023)   Hunger Vital Sign    Worried About Running Out of Food in the Last Year: Never true    Ran Out of Food in the Last Year: Never true  Transportation Needs: No Transportation Needs (09/06/2023)   PRAPARE - Administrator, Civil Service (Medical): No    Lack of Transportation (Non-Medical): No  Physical Activity: Insufficiently Active (09/06/2023)   Exercise Vital Sign    Days of Exercise per Week: 3 days    Minutes of Exercise per Session: 30 min  Stress: No Stress Concern Present (09/06/2023)   [employer] of Occupational Health - Occupational Stress Questionnaire    Feeling of Stress : Not at all  Social Connections: Moderately Integrated (09/06/2023)   Social Connection and Isolation Panel [NHANES]    Frequency of Communication with Friends and Family: More than three times a week    Frequency of Social Gatherings with Friends and Family: More than three times a week    Attends Religious Services: More than 4 times per year    Active Member of Clubs or Organizations: Yes    Attends Banker Meetings: More than 4 times per year    Marital Status: Never married  Intimate Partner Violence: Not At Risk (09/06/2023)   Humiliation, Afraid, Rape, and Kick questionnaire     Fear of Current or Ex-Partner: No    Emotionally Abused: No    Physically Abused: No    Sexually Abused: No    Past Surgical History:  Procedure Laterality Date   APPENDECTOMY  1980's   BREAST EXCISIONAL BIOPSY Left 1989   COLONOSCOPY  last one 06-18-2015   INGUINAL HERNIA REPAIR Left 05-10-2001   LAPAROSCOPIC LYSIS OF ADHESIONS  01/17/2019   OOPHORECTOMY Right 01/17/2019   PERINEOPLASTY  01/17/2019   SALPINGECTOMY Left 01/17/2019   TOTAL HIP ARTHROPLASTY Right 04/09/2020   Procedure: RIGH TOTAL HIP ARTHROPLASTY ANTERIOR APPROACH;  Surgeon: Dayne Even, MD;  Location: [facility];  Service: Orthopedics;  Laterality: Right;   TOTAL HIP ARTHROPLASTY Left 01/28/2021   Procedure: LEFT TOTAL HIP ARTHROPLASTY ANTERIOR APPROACH;  Surgeon: Dayne Even, MD;  Location: [facility];  Service: Orthopedics;  Laterality: Left;   TOTAL KNEE ARTHROPLASTY Left 07/20/2023   Procedure: LEFT TOTAL KNEE ARTHROPLASTY;  Surgeon: Dayne Even, MD;  Location: [facility];  Service: Orthopedics;  Laterality: Left;   VAGINAL HYSTERECTOMY  1980's   VAGINAL PROLAPSE REPAIR N/A 03/02/2016   Procedure: COLOPLAST ANTERIOR  VAULT REPAIR WITH AXIS DERMIS. SACROSPINUS FIXATION, AUGMENTATION WITH AXIS DERMIS;  Surgeon: Annamarie Kid, MD;  Location: [facility];  Service: Urology;  Laterality: N/A;    Family History  Problem Relation Age of Onset   Aneurysm Mother 40       deceased secondary to brain aneurysm   Liver cancer Father 52       deceased   Diabetes Other        grandmother   Colon cancer Neg Hx    Stomach cancer Neg Hx    Rectal cancer Neg Hx    Esophageal cancer Neg Hx    Breast cancer Neg Hx     Allergies  Allergen Reactions   Flexeril  [Cyclobenzaprine ]     Hallucinations    Lisinopril  Cough   Norvasc  [Amlodipine ] Swelling    Lower extremity edema    Penicillins Hives    Has patient had a PCN reaction causing immediate rash, facial/tongue/throat swelling, SOB or lightheadedness  with hypotension: Yes Has patient had a PCN reaction causing severe rash involving mucus membranes or skin necrosis: No Has patient had a PCN reaction that required hospitalization: No Has patient had a PCN reaction occurring within the last 10 years: No If all of the above answers are "NO", then may proceed with Cephalosporin use.     Current Outpatient Medications on File Prior to Visit  Medication Sig Dispense Refill   Ascorbic Acid (VITAMIN C) 1000 MG tablet Take 1,000 mg by mouth daily.     aspirin  81 MG tablet Take 1 tablet (81 mg total) by mouth 2 (two) times daily after a meal. 60 tablet 0   b complex vitamins capsule Take 1 capsule by mouth daily.     Biotin 1000 MCG tablet Take 1,000 mcg by mouth daily.     Blood Glucose Monitoring Suppl (ONETOUCH VERIO FLEX SYSTEM) w/Device KIT TEST TWICE DAILY 1 kit 0   Calcium  Carbonate-Vitamin D  600-400 MG-UNIT tablet Take 1 tablet by mouth daily.     Chromium 1000 MCG TABS Take 1,000 mcg by mouth daily.     Cinnamon 500 MG capsule Take 1,000 mg by mouth daily.     CRANBERRY PO Take 2 tablets by mouth daily. 4200 mg     DULoxetine  (CYMBALTA ) 20 MG capsule TAKE 1 CAPSULE(20 MG) BY MOUTH DAILY 90 capsule 0   ELDERBERRY PO Take 1 capsule by mouth daily.     estradiol  (ESTRACE ) 0.1 MG/GM vaginal cream Place 1 Applicatorful vaginally every other day. In the evening  3   fluticasone  (FLONASE ) 50 MCG/ACT nasal spray SHAKE LIQUID AND USE 2 SPRAYS IN EACH NOSTRIL DAILY 16 g 6   gabapentin  (NEURONTIN ) 100 MG capsule Take 1-3 capsules (100-300 mg total) by mouth at bedtime as needed. 30 capsule 3   glucose blood (ONETOUCH VERIO) test strip 1 each by Other route 2 (two) times daily. Use as instructed 200 strip 3   Lancets (ONETOUCH DELICA PLUS LANCET33G) MISC USE ONCE DAILY 100 each 3   Misc Natural Products (TART CHERRY ADVANCED) CAPS Take 2 capsules by mouth daily.     Multiple Vitamins-Minerals (MULTIVITAMIN WITH MINERALS) tablet Take  1 tablet by  mouth daily. Centrum silver     MYRBETRIQ  25 MG TB24 tablet Take 25 mg by mouth at bedtime.     olmesartan  (BENICAR ) 20 MG tablet TAKE 1 TABLET(20 MG) BY MOUTH DAILY 90 tablet 1   OVER THE COUNTER MEDICATION Take 1 tablet by mouth daily as needed (constipation). Vital Lax     polyethylene glycol (MIRALAX  / GLYCOLAX ) packet Take 17 g by mouth daily as needed for mild constipation.     rosuvastatin  (CRESTOR ) 40 MG tablet Take 1 tablet (40 mg total) by mouth daily. 90 tablet 3   Turmeric 500 MG CAPS Take 1,000 mg by mouth daily.      [DISCONTINUED] valsartan  (DIOVAN ) 160 MG tablet Take 1/2 tablet by mouth once daily 30 tablet 5   No current facility-administered medications on file prior to visit.    BP 138/70   Pulse 81   Temp 97.9 F (36.6 C)   Wt 151 lb (68.5 kg)   SpO2 96%   BMI 26.75 kg/m       Objective:   Physical Exam Vitals and nursing note reviewed.  Constitutional:      General: She is not in acute distress.    Appearance: Normal appearance. She is not ill-appearing.  HENT:     Head: Normocephalic and atraumatic.     Right Ear: Tympanic membrane, ear canal and external ear normal. There is no impacted cerumen.     Left Ear: Tympanic membrane, ear canal and external ear normal. There is no impacted cerumen.     Nose: Nose normal. No congestion or rhinorrhea.     Mouth/Throat:     Mouth: Mucous membranes are moist.     Pharynx: Oropharynx is clear.  Eyes:     Extraocular Movements: Extraocular movements intact.     Conjunctiva/sclera: Conjunctivae normal.     Pupils: Pupils are equal, round, and reactive to light.  Neck:     Vascular: No carotid bruit.  Cardiovascular:     Rate and Rhythm: Normal rate and regular rhythm.     Pulses: Normal pulses.     Heart sounds: No murmur heard.    No friction rub. No gallop.  Pulmonary:     Effort: Pulmonary effort is normal.     Breath sounds: Normal breath sounds.  Abdominal:     General: Abdomen is flat. Bowel sounds  are normal. There is no distension.     Palpations: Abdomen is soft. There is no mass.     Tenderness: There is no abdominal tenderness. There is no guarding or rebound.     Hernia: No hernia is present.  Musculoskeletal:        General: Normal range of motion.     Cervical back: Normal range of motion and neck supple.  Lymphadenopathy:     Cervical: No cervical adenopathy.  Skin:    General: Skin is warm and dry.     Capillary Refill: Capillary refill takes less than 2 seconds.  Neurological:     General: No focal deficit present.     Mental Status: She is alert and oriented to person, place, and time.  Psychiatric:        Mood and Affect: Mood normal.        Behavior: Behavior normal.        Thought Content: Thought content normal.        Judgment: Judgment normal.       Assessment & Plan:  1. Routine  general medical examination at a health care facility (Primary) Today patient counseled on age appropriate routine health concerns for screening and prevention, each reviewed and up to date or declined. Immunizations reviewed and up to date or declined. Labs ordered and reviewed. Risk factors for depression reviewed and negative. Hearing function and visual acuity are intact. ADLs screened and addressed as needed. Functional ability and level of safety reviewed and appropriate. Education, counseling and referrals performed based on assessed risks today. Patient provided with a copy of personalized plan for preventive services. - Follow up in one year  - Stay active and eat healthy  - Ambulatory referral to Obstetrics / Gynecology  2. Diet-controlled diabetes mellitus (HCC) - Consider Metformin   - CBC with Differential/Platelet; Future - Comprehensive metabolic panel with GFR; Future - Lipid panel; Future - TSH; Future - Hemoglobin A1c; Future - Microalbumin/Creatinine Ratio, Urine; Future - Microalbumin/Creatinine Ratio, Urine - Hemoglobin A1c - TSH - Lipid panel -  Comprehensive metabolic panel with GFR - CBC with Differential/Platelet  3. Essential hypertension - Controlled. No change in medication  - CBC with Differential/Platelet; Future - Comprehensive metabolic panel with GFR; Future - Lipid panel; Future - TSH; Future - TSH - Lipid panel - Comprehensive metabolic panel with GFR - CBC with Differential/Platelet  4. Dyslipidemia - Consider increase in statin  - CBC with Differential/Platelet; Future - Comprehensive metabolic panel with GFR; Future - Lipid panel; Future - TSH; Future - TSH - Lipid panel - Comprehensive metabolic panel with GFR - CBC with Differential/Platelet  5. Primary osteoarthritis involving multiple joints  - CBC with Differential/Platelet; Future - Comprehensive metabolic panel with GFR; Future - Lipid panel; Future - TSH; Future - TSH - Lipid panel - Comprehensive metabolic panel with GFR - CBC with Differential/Platelet  6. Frequent urination  - POC Urinalysis Dipstick + leuks and nitrites  - Mild symptoms. Will send culture and treat if needed - Culture, Urine; Future - Culture, Urine  7. OAB (overactive bladder)  - Ambulatory referral to Obstetrics / Gynecology  8. Benign paroxysmal positional vertigo due to bilateral vestibular disorder  - meclizine  (ANTIVERT ) 25 MG tablet; TAKE 1 TABLET(25 MG) BY MOUTH THREE TIMES DAILY AS NEEDED FOR DIZZINESS  Dispense: 30 tablet; Refill: 1  Brax Walen, NP

## 2024-03-15 LAB — MICROALBUMIN / CREATININE URINE RATIO
Creatinine,U: 40.8 mg/dL
Microalb Creat Ratio: 20.6 mg/g (ref 0.0–30.0)
Microalb, Ur: 0.8 mg/dL (ref 0.0–1.9)

## 2024-03-15 LAB — COMPREHENSIVE METABOLIC PANEL WITH GFR
ALT: 24 U/L (ref 0–35)
AST: 28 U/L (ref 0–37)
Albumin: 4.2 g/dL (ref 3.5–5.2)
Alkaline Phosphatase: 64 U/L (ref 39–117)
BUN: 16 mg/dL (ref 6–23)
CO2: 29 meq/L (ref 19–32)
Calcium: 9.6 mg/dL (ref 8.4–10.5)
Chloride: 104 meq/L (ref 96–112)
Creatinine, Ser: 1.01 mg/dL (ref 0.40–1.20)
GFR: 54.5 mL/min — ABNORMAL LOW (ref >60.00)
Glucose, Bld: 98 mg/dL (ref 70–99)
Potassium: 4.2 meq/L (ref 3.5–5.1)
Sodium: 139 meq/L (ref 135–145)
Total Bilirubin: 0.4 mg/dL (ref 0.2–1.2)
Total Protein: 6.6 g/dL (ref 6.0–8.3)

## 2024-03-15 LAB — LIPID PANEL
Cholesterol: 164 mg/dL (ref 0–200)
HDL: 62.8 mg/dL (ref >39.00)
LDL Cholesterol: 77 mg/dL (ref 0–99)
NonHDL: 100.86
Total CHOL/HDL Ratio: 3
Triglycerides: 121 mg/dL (ref 0.0–149.0)
VLDL: 24.2 mg/dL (ref 0.0–40.0)

## 2024-03-15 LAB — CBC WITH DIFFERENTIAL/PLATELET
Basophils Absolute: 0.1 10*3/uL (ref 0.0–0.1)
Basophils Relative: 0.8 % (ref 0.0–3.0)
Eosinophils Absolute: 0.3 10*3/uL (ref 0.0–0.7)
Eosinophils Relative: 3.6 % (ref 0.0–5.0)
HCT: 39.9 % (ref 36.0–46.0)
Hemoglobin: 12.7 g/dL (ref 12.0–15.0)
Lymphocytes Relative: 31.9 % (ref 12.0–46.0)
Lymphs Abs: 2.8 10*3/uL (ref 0.7–4.0)
MCHC: 31.8 g/dL (ref 30.0–36.0)
MCV: 82.4 fl (ref 78.0–100.0)
Monocytes Absolute: 0.6 10*3/uL (ref 0.1–1.0)
Monocytes Relative: 6.6 % (ref 3.0–12.0)
Neutro Abs: 4.9 10*3/uL (ref 1.4–7.7)
Neutrophils Relative %: 57.1 % (ref 43.0–77.0)
Platelets: 267 10*3/uL (ref 150.0–400.0)
RBC: 4.84 Mil/uL (ref 3.87–5.11)
RDW: 16.8 % — ABNORMAL HIGH (ref 11.5–15.5)
WBC: 8.7 10*3/uL (ref 4.0–10.5)

## 2024-03-15 LAB — HEMOGLOBIN A1C: Hgb A1c MFr Bld: 6.5 % (ref 4.6–6.5)

## 2024-03-15 LAB — TSH: TSH: 1.01 u[IU]/mL (ref 0.35–5.50)

## 2024-03-16 ENCOUNTER — Telehealth: Payer: Self-pay | Admitting: Family Medicine

## 2024-03-16 LAB — URINE CULTURE
MICRO NUMBER:: 16359481
SPECIMEN QUALITY:: ADEQUATE

## 2024-03-16 NOTE — Telephone Encounter (Signed)
 Pt called, wondering if the EMG showed that her hands had neuropathy along with the carpal tunnel, or if Dr. Felipe Horton thinks she might have neuropathy in her hands.

## 2024-03-17 ENCOUNTER — Other Ambulatory Visit: Payer: Self-pay | Admitting: Adult Health

## 2024-03-17 MED ORDER — CIPROFLOXACIN HCL 500 MG PO TABS: 500.0000 mg | ORAL_TABLET | Freq: Two times a day (BID) | ORAL | 0 refills | Status: DC

## 2024-03-20 ENCOUNTER — Telehealth: Payer: Self-pay

## 2024-03-20 NOTE — Telephone Encounter (Signed)
 Copied from CRM [id number]. Topic: Clinical - Medical Advice >> Mar 20, 2024  1:46 PM Jethro Morrison wrote: Reason for CRM: PT CALLED STATED SHE IS STILL HAVING SOME DISCOMFORT WITH THE UTI AND WOULD LIKE HER MEDS EXTENDED.

## 2024-03-21 ENCOUNTER — Other Ambulatory Visit: Payer: Self-pay

## 2024-03-21 ENCOUNTER — Other Ambulatory Visit: Payer: Self-pay | Admitting: Adult Health

## 2024-03-21 DIAGNOSIS — E1121 Type 2 diabetes mellitus with diabetic nephropathy: Secondary | ICD-10-CM

## 2024-03-21 MED ORDER — CIPROFLOXACIN HCL 500 MG PO TABS: 500.0000 mg | ORAL_TABLET | Freq: Two times a day (BID) | ORAL | 0 refills | Status: AC

## 2024-03-21 MED ORDER — ONETOUCH VERIO VI STRP: 1.0000 | ORAL_STRIP | Freq: Two times a day (BID) | 3 refills | Status: AC

## 2024-03-21 MED ORDER — ONETOUCH DELICA PLUS LANCET33G MISC: 3 refills | Status: DC

## 2024-03-21 NOTE — Telephone Encounter (Signed)
 Copied from CRM [id number]. Topic: Clinical - Medication Refill >> Mar 21, 2024  2:05 PM Varney Gentleman wrote: Most Recent Primary Care Visit:  Provider: [place]  Department: [facility]  Visit Type: OFFICE VISIT  Date: 03/14/2024  Medication: Blood Glucose Monitoring Suppl (ONETOUCH VERIO FLEX SYSTEM) w/Device KIT  Has the patient contacted their pharmacy? Yes, needs doctor to send in prescription  (Agent: If no, request that the patient contact the pharmacy for the refill. If patient does not wish to contact the pharmacy document the reason why and proceed with request.) (Agent: If yes, when and what did the pharmacy advise?)  Is this the correct pharmacy for this prescription? Yes If no, delete pharmacy and type the correct one.  This is the patient's preferred pharmacy:  [facility] [address] [place] - [address] [place] [address] Phone: [phone] Fax: [phone]   Has the prescription been filled recently? No  Is the patient out of the medication? Yes, monitor has broken  Has the patient been seen for an appointment in the last year OR does the patient have an upcoming appointment? Yes  Can we respond through MyChart? No  Agent: Please be advised that Rx refills may take up to 3 business days. We ask that you follow-up with your pharmacy.

## 2024-03-21 NOTE — Telephone Encounter (Signed)
**Note De-identified  [place] Obfuscation** Please advise 

## 2024-03-21 NOTE — Telephone Encounter (Signed)
 Patient notified of update  and verbalized understanding.

## 2024-03-21 NOTE — Telephone Encounter (Signed)
 Rx refilled.

## 2024-03-22 ENCOUNTER — Telehealth: Payer: Self-pay

## 2024-03-22 ENCOUNTER — Other Ambulatory Visit: Payer: Self-pay

## 2024-03-22 MED ORDER — ONETOUCH VERIO FLEX SYSTEM W/DEVICE KIT: PACK | 0 refills | Status: DC

## 2024-03-22 NOTE — Telephone Encounter (Signed)
 Noted! Meter sent in.

## 2024-03-22 NOTE — Telephone Encounter (Signed)
 Copied from CRM [id number]. Topic: Clinical - Prescription Issue >> Mar 22, 2024 11:32 AM Albertha Alosa wrote: Reason for CRM: Patient called in stating call the pharmacy and they only sent over the strips and not the reader, patient stated she needs the reader sent in

## 2024-04-06 ENCOUNTER — Telehealth: Payer: Self-pay | Admitting: Adult Health

## 2024-04-06 NOTE — Telephone Encounter (Signed)
 Copied from CRM [id number]. Topic: Referral - Request for Referral >> Apr 06, 2024  8:29 AM Lajean Pike wrote: Did the patient discuss referral with their provider in the last year? No (If No - schedule appointment) (If Yes - send message)  Appointment offered? No  Type of order/referral and detailed reason for visit: Referral for an ENT, Patient is dealing with ringing in ears.   Preference of office, provider, location: Within [facility]  If referral order, have you been seen by this specialty before? No (If Yes, this issue or another issue? When? Where?  Can we respond through MyChart? No. Please contact patient through phone number [phone]

## 2024-04-12 ENCOUNTER — Telehealth: Payer: Self-pay | Admitting: *Deleted

## 2024-04-12 DIAGNOSIS — H9319 Tinnitus, unspecified ear: Secondary | ICD-10-CM

## 2024-04-12 NOTE — Telephone Encounter (Signed)
 Patient notified of update  and verbalized understanding.

## 2024-04-12 NOTE — Telephone Encounter (Signed)
 Copied from CRM [id number]. Topic: Referral - Request for Referral >> Apr 06, 2024  8:29 AM Lajean Pike wrote: Did the patient discuss referral with their provider in the last year? No (If No - schedule appointment) (If Yes - send message)  Appointment offered? No  Type of order/referral and detailed reason for visit: Referral for an ENT, Patient is dealing with ringing in ears.   Preference of office, provider, location: Within [facility]  If referral order, have you been seen by this specialty before? No (If Yes, this issue or another issue? When? Where?  Can we respond through MyChart? No. Please contact patient through phone number [phone] >> Apr 12, 2024 10:48 AM Juluis Ok wrote: Patient calling to check the status of referral. Advised her that referral is still pending.

## 2024-04-12 NOTE — Telephone Encounter (Signed)
 Called pt no answer. Ok for referral?

## 2024-04-12 NOTE — Telephone Encounter (Signed)
[place] for referral for tinnitus

## 2024-04-18 ENCOUNTER — Ambulatory Visit: Payer: Self-pay

## 2024-04-18 NOTE — Telephone Encounter (Signed)
 Chief Complaint: sinus  Symptoms: headache, nasal congestion, runny nose Frequency: since last week Pertinent Negatives: Patient denies fever Disposition: [] ED /[] Urgent Care (no appt availability in office) / [x] Appointment(In office/virtual)/ []  [facility]/ [] Home Care/ [] Refused Recommended Disposition /[] [facility]/ []  Follow-up with PCP Additional Notes: pt states she has a headache and when she blows her nose sometimes it has blood in it. States it started last week but has been on and off for a long time. States the she has also developed a cough. States post nasal drip, runny nose and nasal congestion. States she had a sore throat but did salt water  gargle and it feels better. States headache 7/10 today.   Summary: sinus infection   Copied From CRM [id number]. Reason for Triage: Sinus infection - headache,  nasal drainage         Reason for Disposition  Lots of coughing  Protocols used: Sinus Pain or Congestion-A-AH

## 2024-04-20 ENCOUNTER — Telehealth (INDEPENDENT_AMBULATORY_CARE_PROVIDER_SITE_OTHER): Admitting: Adult Health

## 2024-04-20 ENCOUNTER — Encounter: Payer: Self-pay | Admitting: Adult Health

## 2024-04-20 VITALS — Temp 98.1°F | Wt 151.0 lb

## 2024-04-20 DIAGNOSIS — R051 Acute cough: Secondary | ICD-10-CM

## 2024-04-20 DIAGNOSIS — J0141 Acute recurrent pansinusitis: Secondary | ICD-10-CM

## 2024-04-20 MED ORDER — BENZONATATE 200 MG PO CAPS
200.0000 mg | ORAL_CAPSULE | Freq: Three times a day (TID) | ORAL | 1 refills | Status: DC | PRN
Start: 2024-04-20 — End: 2024-08-04

## 2024-04-20 MED ORDER — AZITHROMYCIN 250 MG PO TABS: ORAL_TABLET | ORAL | 0 refills | Status: AC

## 2024-04-20 NOTE — Progress Notes (Signed)
 Virtual Visit via Video Note  I connected with Cathy [name]  on 04/20/24 at  7:00 AM EDT by a video enabled telemedicine application and verified that I am speaking with the correct person using two identifiers.  Location patient: home Location provider:work or home office Persons participating in the virtual visit: patient, provider  I discussed the limitations of evaluation and management by telemedicine and the availability of in person appointments. The patient expressed understanding and agreed to proceed.   HPI:  She is being evaluated today for an acute issue.  She reports starting last week she developed a headache and nasal congestion with sinus pain and pressure and a dry cough.  When she blows her nose the mucus is discolored and sometimes has blood in it. She did have a low grade fever earlier in the week.  She did have a sore throat when her symptoms started but did salt water  gargles and this seemed to resolve   At home she has been using Tylenol , Zyrtec and Claritin  without relief.   CT Head in January 2025 showed no issues with her sinuses      ROS: See pertinent positives and negatives per HPI.  Past Medical History:  Diagnosis Date   Chronic constipation    DJD (degenerative joint disease)    knees   Female cystocele    GERD (gastroesophageal reflux disease)    History of esophagitis    History of left inguinal hernia    History of MRSA infection    recurrent carbuncle   History of recurrent UTIs    Hyperlipidemia    Hypertension    Pre-diabetes    diet controlled   Urgency of urination     Past Surgical History:  Procedure Laterality Date   APPENDECTOMY  1980's   BREAST EXCISIONAL BIOPSY Left 1989   COLONOSCOPY  last one 06-18-2015   INGUINAL HERNIA REPAIR Left 05-10-2001   LAPAROSCOPIC LYSIS OF ADHESIONS  01/17/2019   OOPHORECTOMY Right 01/17/2019   PERINEOPLASTY  01/17/2019   SALPINGECTOMY Left 01/17/2019   TOTAL HIP ARTHROPLASTY Right  04/09/2020   Procedure: RIGH TOTAL HIP ARTHROPLASTY ANTERIOR APPROACH;  Surgeon: Dayne Even, MD;  Location: [facility];  Service: Orthopedics;  Laterality: Right;   TOTAL HIP ARTHROPLASTY Left 01/28/2021   Procedure: LEFT TOTAL HIP ARTHROPLASTY ANTERIOR APPROACH;  Surgeon: Dayne Even, MD;  Location: [facility];  Service: Orthopedics;  Laterality: Left;   TOTAL KNEE ARTHROPLASTY Left 07/20/2023   Procedure: LEFT TOTAL KNEE ARTHROPLASTY;  Surgeon: Dayne Even, MD;  Location: [facility];  Service: Orthopedics;  Laterality: Left;   VAGINAL HYSTERECTOMY  1980's   VAGINAL PROLAPSE REPAIR N/A 03/02/2016   Procedure: COLOPLAST ANTERIOR  VAULT REPAIR WITH AXIS DERMIS. SACROSPINUS FIXATION, AUGMENTATION WITH AXIS DERMIS;  Surgeon: Annamarie Kid, MD;  Location: [facility];  Service: Urology;  Laterality: N/A;    Family History  Problem Relation Age of Onset   Aneurysm Mother 74       deceased secondary to brain aneurysm   Liver cancer Father 63       deceased   Diabetes Other        grandmother   Colon cancer Neg Hx    Stomach cancer Neg Hx    Rectal cancer Neg Hx    Esophageal cancer Neg Hx    Breast cancer Neg Hx        Current Outpatient Medications:    Ascorbic Acid (VITAMIN C) 1000 MG tablet, Take 1,000 mg by  mouth daily., Disp: , Rfl:    aspirin  81 MG tablet, Take 1 tablet (81 mg total) by mouth 2 (two) times daily after a meal., Disp: 60 tablet, Rfl: 0   b complex vitamins capsule, Take 1 capsule by mouth daily., Disp: , Rfl:    Biotin 1000 MCG tablet, Take 1,000 mcg by mouth daily., Disp: , Rfl:    Blood Glucose Monitoring Suppl (ONETOUCH VERIO FLEX SYSTEM) w/Device KIT, TEST TID DAILY, Disp: 1 kit, Rfl: 0   Calcium  Carbonate-Vitamin D  600-400 MG-UNIT tablet, Take 1 tablet by mouth daily., Disp: , Rfl:    Chromium 1000 MCG TABS, Take 1,000 mcg by mouth daily., Disp: , Rfl:    Cinnamon 500 MG capsule, Take 1,000 mg by mouth daily., Disp: , Rfl:    CRANBERRY PO,  Take 2 tablets by mouth daily. 4200 mg, Disp: , Rfl:    DULoxetine  (CYMBALTA ) 20 MG capsule, TAKE 1 CAPSULE(20 MG) BY MOUTH DAILY, Disp: 90 capsule, Rfl: 0   ELDERBERRY PO, Take 1 capsule by mouth daily., Disp: , Rfl:    estradiol  (ESTRACE ) 0.1 MG/GM vaginal cream, Place 1 Applicatorful vaginally every other day. In the evening, Disp: , Rfl: 3   fluticasone  (FLONASE ) 50 MCG/ACT nasal spray, SHAKE LIQUID AND USE 2 SPRAYS IN EACH NOSTRIL DAILY, Disp: 16 g, Rfl: 6   gabapentin  (NEURONTIN ) 100 MG capsule, Take 1-3 capsules (100-300 mg total) by mouth at bedtime as needed., Disp: 30 capsule, Rfl: 3   glucose blood (ONETOUCH VERIO) test strip, 1 each by Other route 2 (two) times daily. Use as instructed, Disp: 200 strip, Rfl: 3   Lancets (ONETOUCH DELICA PLUS LANCET33G) MISC, USE ONCE DAILY, Disp: 100 each, Rfl: 3   meclizine  (ANTIVERT ) 25 MG tablet, TAKE 1 TABLET(25 MG) BY MOUTH THREE TIMES DAILY AS NEEDED FOR DIZZINESS, Disp: 30 tablet, Rfl: 1   Misc Natural Products (TART CHERRY ADVANCED) CAPS, Take 2 capsules by mouth daily., Disp: , Rfl:    Multiple Vitamins-Minerals (MULTIVITAMIN WITH MINERALS) tablet, Take 1 tablet by mouth daily. Centrum silver, Disp: , Rfl:    MYRBETRIQ  25 MG TB24 tablet, Take 25 mg by mouth at bedtime., Disp: , Rfl:    olmesartan  (BENICAR ) 20 MG tablet, TAKE 1 TABLET(20 MG) BY MOUTH DAILY, Disp: 90 tablet, Rfl: 1   OVER THE COUNTER MEDICATION, Take 1 tablet by mouth daily as needed (constipation). Vital Lax, Disp: , Rfl:    polyethylene glycol (MIRALAX  / GLYCOLAX ) packet, Take 17 g by mouth daily as needed for mild constipation., Disp: , Rfl:    rosuvastatin  (CRESTOR ) 40 MG tablet, Take 1 tablet (40 mg total) by mouth daily., Disp: 90 tablet, Rfl: 3   Turmeric 500 MG CAPS, Take 1,000 mg by mouth daily. , Disp: , Rfl:   EXAM:  VITALS per patient if applicable:  GENERAL: alert, oriented, appears well and in no acute distress  HEENT: atraumatic, conjunttiva clear, no obvious  abnormalities on inspection of external nose and ears  NECK: normal movements of the head and neck  LUNGS: on inspection no signs of respiratory distress, breathing rate appears normal, no obvious gross SOB, gasping or wheezing  CV: no obvious cyanosis  MS: moves all visible extremities without noticeable abnormality  PSYCH/NEURO: pleasant and cooperative, no obvious depression or anxiety, speech and thought processing grossly intact  ASSESSMENT AND PLAN:  Discussed the following assessment and plan:  1. Acute recurrent pansinusitis (Primary) - Will treat for suspected sinusitis.Encouraged to use normal saline spray daily.  -  Follow up if not resolving by the end of abx therapy.  - azithromycin  (ZITHROMAX ) 250 MG tablet; Take 2 tablets on day 1, then 1 tablet daily on days 2 through 5  Dispense: 6 tablet; Refill: 0  2. Acute cough  - benzonatate  (TESSALON ) 200 MG capsule; Take 1 capsule (200 mg total) by mouth 3 (three) times daily as needed for cough.  Dispense: 30 capsule; Refill: 1      I discussed the assessment and treatment plan with the patient. The patient was provided an opportunity to ask questions and all were answered. The patient agreed with the plan and demonstrated an understanding of the instructions.   The patient was advised to call back or seek an in-person evaluation if the symptoms worsen or if the condition fails to improve as anticipated.   Agam Tuohy, NP
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
## 2024-04-26 ENCOUNTER — Other Ambulatory Visit: Payer: Self-pay

## 2024-04-26 ENCOUNTER — Ambulatory Visit (INDEPENDENT_AMBULATORY_CARE_PROVIDER_SITE_OTHER): Admitting: Family Medicine

## 2024-04-26 ENCOUNTER — Encounter: Payer: Self-pay | Admitting: Family Medicine

## 2024-04-26 VITALS — BP 138/78 | HR 99 | Ht 63.0 in | Wt 155.0 lb

## 2024-04-26 DIAGNOSIS — M1711 Unilateral primary osteoarthritis, right knee: Secondary | ICD-10-CM | POA: Diagnosis not present

## 2024-04-26 DIAGNOSIS — G5603 Carpal tunnel syndrome, bilateral upper limbs: Secondary | ICD-10-CM

## 2024-04-26 NOTE — Assessment & Plan Note (Signed)
 Chronic problem, wants to avoid any surgical intervention with patient having significant difficulty after surgery on her other replacements.  Given injection again today and hopeful that this will be significantly beneficial.  Follow-up with me again in 3 months otherwise.

## 2024-04-26 NOTE — Assessment & Plan Note (Signed)
 Bilateral injections given again today.  Will refer patient to discuss potential surgery.  Patient has had injections over the course of the last 3 years and now do not seem to be lasting at least 3 months.  Do think surgical intervention will be necessary.

## 2024-04-26 NOTE — Patient Instructions (Signed)
 Injection in wrists and knee today Good to see you! See you again in 3 months

## 2024-04-27 ENCOUNTER — Other Ambulatory Visit: Payer: Self-pay | Admitting: Adult Health

## 2024-04-27 DIAGNOSIS — Z1231 Encounter for screening mammogram for malignant neoplasm of breast: Secondary | ICD-10-CM

## 2024-05-01 ENCOUNTER — Other Ambulatory Visit: Payer: Self-pay | Admitting: Adult Health

## 2024-05-01 DIAGNOSIS — H8113 Benign paroxysmal vertigo, bilateral: Secondary | ICD-10-CM

## 2024-05-05 ENCOUNTER — Ambulatory Visit (INDEPENDENT_AMBULATORY_CARE_PROVIDER_SITE_OTHER): Admitting: Adult Health

## 2024-05-05 VITALS — BP 100/60 | HR 78 | Temp 97.8°F | Ht 63.0 in | Wt 153.0 lb

## 2024-05-05 DIAGNOSIS — R0789 Other chest pain: Secondary | ICD-10-CM | POA: Diagnosis not present

## 2024-05-05 NOTE — Progress Notes (Signed)
 Subjective:    Patient ID: Cathy [name], female    DOB: January 11, 1946, 78 y.o.   MRN: [id number]  HPI 78 year old female who  has a past medical history of Chronic constipation, DJD (degenerative joint disease), Female cystocele, GERD (gastroesophageal reflux disease), History of esophagitis, History of left inguinal hernia, History of MRSA infection, History of recurrent UTIs, Hyperlipidemia, Hypertension, Pre-diabetes, and Urgency of urination.   Discussed the use of AI scribe software for clinical note transcription with the patient, who gave verbal consent to proceed.  History of Present Illness   [name] is a 78 year old female who presents with chest pain.  She experienced sharp/pressure like chest pain that began on the upper right side of her chest and then moved to her left side.  The pain resembles gas pain and has been intermittent over the past week. Relief is achieved with Tums and Alka-Seltzer, belching and passing gas. She has not had any pain over the last 2-3 days. She did not experience any left sided arm pain or radiating pain up her left jaw.  There are no symptoms of nausea, vomiting, fever, chills, or palpitations.        Review of Systems See HPI   Past Medical History:  Diagnosis Date   Chronic constipation    DJD (degenerative joint disease)    knees   Female cystocele    GERD (gastroesophageal reflux disease)    History of esophagitis    History of left inguinal hernia    History of MRSA infection    recurrent carbuncle   History of recurrent UTIs    Hyperlipidemia    Hypertension    Pre-diabetes    diet controlled   Urgency of urination     Social History   Socioeconomic History   Marital status: Single    Spouse name: Not on file   Number of children: Not on file   Years of education: Not on file   Highest education level: Not on file  Occupational History   Not on file  Tobacco Use   Smoking status: Former    Current  packs/day: 0.00    Types: Cigarettes    Start date: 02/28/1995    Quit date: 02/28/1996    Years since quitting: 28.2   Smokeless tobacco: Never  Vaping Use   Vaping status: Never Used  Substance and Sexual Activity   Alcohol use: Yes    Alcohol/week: 4.0 standard drinks of alcohol    Types: 4 Standard drinks or equivalent per week    Comment: on weekends   Drug use: No   Sexual activity: Yes    Birth control/protection: Surgical  Other Topics Concern   Not on file  Social History Narrative   Single   Former Smoker  -  quit 10 to 11 years ago (light smoker)   Alcohol use-yes     2 children    Occupation: [employer]    Social Drivers of Health   Financial Resource Strain: Low Risk  (09/06/2023)   Overall Financial Resource Strain (CARDIA)    Difficulty of Paying Living Expenses: Not hard at all  Food Insecurity: No Food Insecurity (09/06/2023)   Hunger Vital Sign    Worried About Running Out of Food in the Last Year: Never true    Ran Out of Food in the Last Year: Never true  Transportation Needs: No Transportation Needs (09/06/2023)   PRAPARE - Transportation  Lack of Transportation (Medical): No    Lack of Transportation (Non-Medical): No  Physical Activity: Insufficiently Active (09/06/2023)   Exercise Vital Sign    Days of Exercise per Week: 3 days    Minutes of Exercise per Session: 30 min  Stress: No Stress Concern Present (09/06/2023)   [employer] of Occupational Health - Occupational Stress Questionnaire    Feeling of Stress : Not at all  Social Connections: Moderately Integrated (09/06/2023)   Social Connection and Isolation Panel    Frequency of Communication with Friends and Family: More than three times a week    Frequency of Social Gatherings with Friends and Family: More than three times a week    Attends Religious Services: More than 4 times per year    Active Member of Clubs or Organizations: Yes    Attends Banker  Meetings: More than 4 times per year    Marital Status: Never married  Intimate Partner Violence: Not At Risk (09/06/2023)   Humiliation, Afraid, Rape, and Kick questionnaire    Fear of Current or Ex-Partner: No    Emotionally Abused: No    Physically Abused: No    Sexually Abused: No    Past Surgical History:  Procedure Laterality Date   APPENDECTOMY  1980's   BREAST EXCISIONAL BIOPSY Left 1989   COLONOSCOPY  last one 06-18-2015   INGUINAL HERNIA REPAIR Left 05-10-2001   LAPAROSCOPIC LYSIS OF ADHESIONS  01/17/2019   OOPHORECTOMY Right 01/17/2019   PERINEOPLASTY  01/17/2019   SALPINGECTOMY Left 01/17/2019   TOTAL HIP ARTHROPLASTY Right 04/09/2020   Procedure: RIGH TOTAL HIP ARTHROPLASTY ANTERIOR APPROACH;  Surgeon: Dayne Even, MD;  Location: [facility];  Service: Orthopedics;  Laterality: Right;   TOTAL HIP ARTHROPLASTY Left 01/28/2021   Procedure: LEFT TOTAL HIP ARTHROPLASTY ANTERIOR APPROACH;  Surgeon: Dayne Even, MD;  Location: [facility];  Service: Orthopedics;  Laterality: Left;   TOTAL KNEE ARTHROPLASTY Left 07/20/2023   Procedure: LEFT TOTAL KNEE ARTHROPLASTY;  Surgeon: Dayne Even, MD;  Location: [facility];  Service: Orthopedics;  Laterality: Left;   VAGINAL HYSTERECTOMY  1980's   VAGINAL PROLAPSE REPAIR N/A 03/02/2016   Procedure: COLOPLAST ANTERIOR  VAULT REPAIR WITH AXIS DERMIS. SACROSPINUS FIXATION, AUGMENTATION WITH AXIS DERMIS;  Surgeon: Annamarie Kid, MD;  Location: [facility];  Service: Urology;  Laterality: N/A;    Family History  Problem Relation Age of Onset   Aneurysm Mother 64       deceased secondary to brain aneurysm   Liver cancer Father 54       deceased   Diabetes Other        grandmother   Colon cancer Neg Hx    Stomach cancer Neg Hx    Rectal cancer Neg Hx    Esophageal cancer Neg Hx    Breast cancer Neg Hx     Allergies  Allergen Reactions   Flexeril  [Cyclobenzaprine ]     Hallucinations    Lisinopril  Cough   Norvasc   [Amlodipine ] Swelling    Lower extremity edema    Penicillins Hives    Has patient had a PCN reaction causing immediate rash, facial/tongue/throat swelling, SOB or lightheadedness with hypotension: Yes Has patient had a PCN reaction causing severe rash involving mucus membranes or skin necrosis: No Has patient had a PCN reaction that required hospitalization: No Has patient had a PCN reaction occurring within the last 10 years: No If all of the above answers are NO, then may proceed with Cephalosporin use.  Current Outpatient Medications on File Prior to Visit  Medication Sig Dispense Refill   Ascorbic Acid (VITAMIN C) 1000 MG tablet Take 1,000 mg by mouth daily.     aspirin  81 MG tablet Take 1 tablet (81 mg total) by mouth 2 (two) times daily after a meal. 60 tablet 0   b complex vitamins capsule Take 1 capsule by mouth daily.     benzonatate  (TESSALON ) 200 MG capsule Take 1 capsule (200 mg total) by mouth 3 (three) times daily as needed for cough. 30 capsule 1   Biotin 1000 MCG tablet Take 1,000 mcg by mouth daily.     Blood Glucose Monitoring Suppl (ONETOUCH VERIO FLEX SYSTEM) w/Device KIT TEST TID DAILY 1 kit 0   Calcium  Carbonate-Vitamin D  600-400 MG-UNIT tablet Take 1 tablet by mouth daily.     Chromium 1000 MCG TABS Take 1,000 mcg by mouth daily.     Cinnamon 500 MG capsule Take 1,000 mg by mouth daily.     CRANBERRY PO Take 2 tablets by mouth daily. 4200 mg     DULoxetine  (CYMBALTA ) 20 MG capsule TAKE 1 CAPSULE(20 MG) BY MOUTH DAILY 90 capsule 0   ELDERBERRY PO Take 1 capsule by mouth daily.     estradiol  (ESTRACE ) 0.1 MG/GM vaginal cream Place 1 Applicatorful vaginally every other day. In the evening  3   fluticasone  (FLONASE ) 50 MCG/ACT nasal spray SHAKE LIQUID AND USE 2 SPRAYS IN EACH NOSTRIL DAILY 16 g 6   gabapentin  (NEURONTIN ) 100 MG capsule Take 1-3 capsules (100-300 mg total) by mouth at bedtime as needed. 30 capsule 3   glucose blood (ONETOUCH VERIO) test strip 1  each by Other route 2 (two) times daily. Use as instructed 200 strip 3   Lancets (ONETOUCH DELICA PLUS LANCET33G) MISC USE ONCE DAILY 100 each 3   meclizine  (ANTIVERT ) 25 MG tablet TAKE 1 TABLET(25 MG) BY MOUTH THREE TIMES DAILY AS NEEDED FOR DIZZINESS 30 tablet 1   Misc Natural Products (TART CHERRY ADVANCED) CAPS Take 2 capsules by mouth daily.     Multiple Vitamins-Minerals (MULTIVITAMIN WITH MINERALS) tablet Take 1 tablet by mouth daily. Centrum silver     MYRBETRIQ  25 MG TB24 tablet Take 25 mg by mouth at bedtime.     olmesartan  (BENICAR ) 20 MG tablet TAKE 1 TABLET(20 MG) BY MOUTH DAILY 90 tablet 1   OVER THE COUNTER MEDICATION Take 1 tablet by mouth daily as needed (constipation). Vital Lax     polyethylene glycol (MIRALAX  / GLYCOLAX ) packet Take 17 g by mouth daily as needed for mild constipation.     rosuvastatin  (CRESTOR ) 40 MG tablet Take 1 tablet (40 mg total) by mouth daily. 90 tablet 3   Turmeric 500 MG CAPS Take 1,000 mg by mouth daily.      [DISCONTINUED] valsartan  (DIOVAN ) 160 MG tablet Take 1/2 tablet by mouth once daily 30 tablet 5   No current facility-administered medications on file prior to visit.    BP 100/60   Pulse 78   Temp 97.8 F (36.6 C) (Oral)   Ht 5' 3 (1.6 m)   Wt 153 lb (69.4 kg)   SpO2 97%   BMI 27.10 kg/m       Objective:   Physical Exam Constitutional:      Appearance: Normal appearance.   Cardiovascular:     Rate and Rhythm: Normal rate and regular rhythm.     Pulses: Normal pulses.     Heart sounds: Normal heart sounds.  Pulmonary:     Effort: Pulmonary effort is normal.     Breath sounds: Normal breath sounds.  Abdominal:     General: Abdomen is flat. There is no distension.     Palpations: Abdomen is soft.     Tenderness: There is no abdominal tenderness. There is no guarding.   Musculoskeletal:        General: Normal range of motion.   Skin:    General: Skin is warm and dry.   Neurological:     General: No focal deficit  present.     Mental Status: She is alert and oriented to person, place, and time.   Psychiatric:        Mood and Affect: Mood normal.        Behavior: Behavior normal.        Thought Content: Thought content normal.        Judgment: Judgment normal.        Assessment & Plan:  1. Atypical chest pain (Primary)  - EKG 12-Lead- Sinus rhythm with sinus arrhythmia with occassional PVCs, Rate 80 - consistent with previous EKGs  - Symptoms likely related to gas but discussed further evaluation by Cardiology which she refused. If symptoms continue she will let me know and we will refer to cardiology.   Immaculate Crutcher, NP

## 2024-05-23 ENCOUNTER — Telehealth: Payer: Self-pay

## 2024-05-23 NOTE — Telephone Encounter (Signed)
 Copied from CRM [id number]. Topic: General - Other >> May 23, 2024  9:17 AM Thersia BROCKS wrote: Reason for CRM: Patient called wanting to know if NP Darleene Shape thinks that patient should get the measles shot, would like for him or his nurses to give her a callback regarding this

## 2024-05-27 ENCOUNTER — Emergency Department (HOSPITAL_COMMUNITY)

## 2024-05-27 ENCOUNTER — Other Ambulatory Visit: Payer: Self-pay

## 2024-05-27 ENCOUNTER — Encounter (HOSPITAL_COMMUNITY): Payer: Self-pay

## 2024-05-27 ENCOUNTER — Emergency Department (HOSPITAL_COMMUNITY)
Admission: EM | Admit: 2024-05-27 | Discharge: 2024-05-27 | Disposition: A | Discharge status: Home / Self Care | Attending: Emergency Medicine | Admitting: Emergency Medicine

## 2024-05-27 DIAGNOSIS — E119 Type 2 diabetes mellitus without complications: Secondary | ICD-10-CM | POA: Diagnosis not present

## 2024-05-27 DIAGNOSIS — I10 Essential (primary) hypertension: Secondary | ICD-10-CM | POA: Diagnosis not present

## 2024-05-27 DIAGNOSIS — Z79899 Other long term (current) drug therapy: Secondary | ICD-10-CM | POA: Insufficient documentation

## 2024-05-27 DIAGNOSIS — Z7982 Long term (current) use of aspirin: Secondary | ICD-10-CM | POA: Diagnosis not present

## 2024-05-27 DIAGNOSIS — M62830 Muscle spasm of back: Secondary | ICD-10-CM | POA: Diagnosis not present

## 2024-05-27 DIAGNOSIS — R0789 Other chest pain: Secondary | ICD-10-CM | POA: Diagnosis not present

## 2024-05-27 DIAGNOSIS — R079 Chest pain, unspecified: Secondary | ICD-10-CM | POA: Diagnosis not present

## 2024-05-27 LAB — BASIC METABOLIC PANEL WITH GFR
Anion gap: 12 (ref 5–15)
BUN: 18 mg/dL (ref 8–23)
CO2: 25 mmol/L (ref 22–32)
Calcium: 10 mg/dL (ref 8.9–10.3)
Chloride: 100 mmol/L (ref 98–111)
Creatinine, Ser: 0.92 mg/dL (ref 0.44–1.00)
GFR, Estimated: >60 mL/min (ref >60)
Glucose, Bld: 115 mg/dL — ABNORMAL HIGH (ref 70–99)
Potassium: 3.9 mmol/L (ref 3.5–5.1)
Sodium: 137 mmol/L (ref 135–145)

## 2024-05-27 LAB — CBC
HCT: 40.9 % (ref 36.0–46.0)
Hemoglobin: 12.8 g/dL (ref 12.0–15.0)
MCH: 25.5 pg — ABNORMAL LOW (ref 26.0–34.0)
MCHC: 31.3 g/dL (ref 30.0–36.0)
MCV: 81.5 fL (ref 80.0–100.0)
Platelets: 266 K/uL (ref 150–400)
RBC: 5.02 MIL/uL (ref 3.87–5.11)
RDW: 16 % — ABNORMAL HIGH (ref 11.5–15.5)
WBC: 11.3 K/uL — ABNORMAL HIGH (ref 4.0–10.5)
nRBC: 0 % (ref 0.0–0.2)

## 2024-05-27 LAB — TROPONIN I (HIGH SENSITIVITY)
Troponin I (High Sensitivity): 5 ng/L (ref <18)
Troponin I (High Sensitivity): 6 ng/L (ref <18)

## 2024-05-27 MED ORDER — NAPROXEN 500 MG PO TABS
500.0000 mg | ORAL_TABLET | Freq: Once | ORAL | Status: AC
  Administered 2024-05-27: 500 mg via ORAL
  Filled 2024-05-27: qty 1

## 2024-05-27 MED ORDER — IBUPROFEN 400 MG PO TABS
400.0000 mg | ORAL_TABLET | Freq: Four times a day (QID) | ORAL | 0 refills | Status: AC | PRN
Start: 2024-05-27 — End: ?

## 2024-05-27 MED ORDER — METHOCARBAMOL 500 MG PO TABS
500.0000 mg | ORAL_TABLET | Freq: Once | ORAL | Status: AC
  Administered 2024-05-27: 500 mg via ORAL
  Filled 2024-05-27: qty 1

## 2024-05-27 MED ORDER — IBUPROFEN 400 MG PO TABS: 400.0000 mg | ORAL_TABLET | Freq: Four times a day (QID) | ORAL | 0 refills | Status: DC | PRN

## 2024-05-27 MED ORDER — LIDOCAINE 4 % EX PTCH: 1.0000 | MEDICATED_PATCH | Freq: Two times a day (BID) | CUTANEOUS | 0 refills | Status: AC

## 2024-05-27 MED ORDER — METHOCARBAMOL 500 MG PO TABS: 500.0000 mg | ORAL_TABLET | Freq: Two times a day (BID) | ORAL | 0 refills | Status: DC

## 2024-05-27 MED ORDER — LIDOCAINE 5 % EX PTCH
1.0000 | MEDICATED_PATCH | CUTANEOUS | Status: DC
  Administered 2024-05-27: 1 via TRANSDERMAL
  Filled 2024-05-27: qty 1

## 2024-05-27 MED ORDER — ACETAMINOPHEN ER 650 MG PO TBCR: 650.0000 mg | EXTENDED_RELEASE_TABLET | Freq: Three times a day (TID) | ORAL | 0 refills | Status: DC | PRN

## 2024-05-27 MED ORDER — ACETAMINOPHEN ER 650 MG PO TBCR: 650.0000 mg | EXTENDED_RELEASE_TABLET | Freq: Three times a day (TID) | ORAL | 0 refills | Status: AC | PRN

## 2024-05-27 MED ORDER — LIDOCAINE 4 % EX PTCH: 1.0000 | MEDICATED_PATCH | Freq: Two times a day (BID) | CUTANEOUS | 0 refills | Status: DC

## 2024-05-27 NOTE — ED Provider Notes (Signed)
 [place] EMERGENCY DEPARTMENT AT [facility] Provider Note   CSN: [id number] Arrival date & time: 05/27/24  1556     Patient presents with: Chest Pain   Cathy [name] is a 78 y.o. female.   HPI    Patient comes in with chief on her chest pain. Patient has history of diabetes, hyperlipidemia, hypertension.  Patient states that after eating food yesterday, she started having gas type feeling.  Thereafter, as she went to bed she started having left-sided scapular pain.  That pain has been persistent.  The gas pain resolved after she took some over-the-counter medications.  Patient's pain is worse with any activity /movement.  Patient denies any shortness of breath. Patient states that she did do some modified push-ups recently.  She does push-ups twice a week.  She does not recall any specific event of severe pain that came about when she was working out.  Prior to Admission medications   Medication Sig Start Date End Date Taking? Authorizing Provider  acetaminophen  (TYLENOL  8 HOUR) 650 MG CR tablet Take 1 tablet (650 mg total) by mouth every 8 (eight) hours as needed for pain or fever. 05/27/24  Yes Charlyn Sora, MD  ibuprofen  (ADVIL ) 400 MG tablet Take 1 tablet (400 mg total) by mouth every 6 (six) hours as needed. 05/27/24  Yes Charlyn Sora, MD  lidocaine  4 % Place 1 patch onto the skin 2 (two) times daily. 05/27/24  Yes Charlyn Sora, MD  methocarbamol  (ROBAXIN ) 500 MG tablet Take 1 tablet (500 mg total) by mouth 2 (two) times daily. 05/27/24  Yes Charlyn Sora, MD  Ascorbic Acid (VITAMIN C) 1000 MG tablet Take 1,000 mg by mouth daily.    [provider]  aspirin  81 MG tablet Take 1 tablet (81 mg total) by mouth 2 (two) times daily after a meal. 07/20/23   Lenis Barter, PA-C  b complex vitamins capsule Take 1 capsule by mouth daily.    [provider]  benzonatate  (TESSALON ) 200 MG capsule Take 1 capsule (200 mg total) by mouth 3 (three) times daily  as needed for cough. 04/20/24   Nafziger, Darleene, NP  Biotin 1000 MCG tablet Take 1,000 mcg by mouth daily.    [provider]  Blood Glucose Monitoring Suppl (ONETOUCH VERIO FLEX SYSTEM) w/Device KIT TEST TID DAILY 03/22/24   Nafziger, Darleene, NP  Calcium  Carbonate-Vitamin D  600-400 MG-UNIT tablet Take 1 tablet by mouth daily.    [provider]  Chromium 1000 MCG TABS Take 1,000 mcg by mouth daily.    [provider]  Cinnamon 500 MG capsule Take 1,000 mg by mouth daily.    [provider]  CRANBERRY PO Take 2 tablets by mouth daily. 4200 mg    [provider]  DULoxetine  (CYMBALTA ) 20 MG capsule TAKE 1 CAPSULE(20 MG) BY MOUTH DAILY 08/10/23   Nafziger, Darleene, NP  ELDERBERRY PO Take 1 capsule by mouth daily.    [provider]  estradiol  (ESTRACE ) 0.1 MG/GM vaginal cream Place 1 Applicatorful vaginally every other day. In the evening 08/29/18   [provider]  fluticasone  (FLONASE ) 50 MCG/ACT nasal spray SHAKE LIQUID AND USE 2 SPRAYS IN EACH NOSTRIL DAILY 10/19/23   Nafziger, Darleene, NP  gabapentin  (NEURONTIN ) 100 MG capsule Take 1-3 capsules (100-300 mg total) by mouth at bedtime as needed. 11/02/23   Corey, Evan S, MD  glucose blood (ONETOUCH VERIO) test strip 1 each by Other route 2 (two) times daily. Use as instructed  03/21/24   Nafziger, Darleene, NP  Lancets [facility] DELICA PLUS [place]) MISC USE ONCE DAILY 03/21/24   Nafziger, Darleene, NP  meclizine  (ANTIVERT ) 25 MG tablet TAKE 1 TABLET(25 MG) BY MOUTH THREE TIMES DAILY AS NEEDED FOR DIZZINESS 05/03/24   Nafziger, Darleene, NP  Misc Natural Products (TART CHERRY ADVANCED) CAPS Take 2 capsules by mouth daily.    [provider]  Multiple Vitamins-Minerals (MULTIVITAMIN WITH MINERALS) tablet Take 1 tablet by mouth daily. Centrum silver    [provider]  MYRBETRIQ  25 MG TB24 tablet Take 25 mg by mouth at bedtime.    [provider]  olmesartan  (BENICAR ) 20 MG tablet TAKE 1  TABLET(20 MG) BY MOUTH DAILY 02/15/24   Nafziger, Darleene, NP  OVER THE COUNTER MEDICATION Take 1 tablet by mouth daily as needed (constipation). Vital Lax    [provider]  polyethylene glycol (MIRALAX  / GLYCOLAX ) packet Take 17 g by mouth daily as needed for mild constipation.    [provider]  rosuvastatin  (CRESTOR ) 40 MG tablet Take 1 tablet (40 mg total) by mouth daily. 06/29/23   Nafziger, Darleene, NP  Turmeric 500 MG CAPS Take 1,000 mg by mouth daily.     [provider]  valsartan  (DIOVAN ) 160 MG tablet Take 1/2 tablet by mouth once daily 12/18/11 02/12/12  O'Sullivan, Melissa, NP    Allergies: Flexeril  [cyclobenzaprine ], Lisinopril , Norvasc  [amlodipine ], and Penicillins    Review of Systems  All other systems reviewed and are negative.   Updated Vital Signs BP (!) 147/100   Pulse 81   Temp 97.7 F (36.5 C) (Oral)   Resp 15   Ht 5' 3 (1.6 m)   Wt 70 kg   SpO2 98%   BMI 27.34 kg/m   Physical Exam Vitals and nursing note reviewed.  Constitutional:      Appearance: She is well-developed.  HENT:     Head: Atraumatic.  Cardiovascular:     Rate and Rhythm: Normal rate.  Pulmonary:     Effort: Pulmonary effort is normal.  Musculoskeletal:     Cervical back: Normal range of motion and neck supple.     Comments: Reproducible tenderness with palpation of the left scapular region.  Tenderness is worse with patient putting her left hand onto the right shoulder.  Skin:    General: Skin is warm and dry.  Neurological:     Mental Status: She is alert and oriented to person, place, and time.     (all labs ordered are listed, but only abnormal results are displayed) Labs Reviewed  BASIC METABOLIC PANEL WITH GFR - Abnormal; Notable for the following components:      Result Value   Glucose, Bld 115 (*)    All other components within normal limits  CBC - Abnormal; Notable for the following components:   WBC 11.3 (*)    MCH 25.5 (*)    RDW 16.0 (*)     All other components within normal limits  TROPONIN I (HIGH SENSITIVITY)  TROPONIN I (HIGH SENSITIVITY)    EKG: None  Radiology: [facility] Chest Port 1 View Result Date: 05/27/2024 CLINICAL DATA:  Chest pain EXAM: PORTABLE CHEST 1 VIEW COMPARISON:  None Available. FINDINGS: Normal mediastinum and cardiac silhouette. Normal pulmonary vasculature. No evidence of effusion, infiltrate, or pneumothorax. No acute bony abnormality. IMPRESSION: No acute cardiopulmonary process. Electronically Signed   By: Jackquline Boxer M.D.   On: 05/27/2024 17:57     Procedures   Medications Ordered in the  ED  lidocaine  (LIDODERM ) 5 % 1 patch (1 patch Transdermal Patch Applied 05/27/24 1808)  naproxen  (NAPROSYN ) tablet 500 mg (500 mg Oral Given 05/27/24 1807)  methocarbamol  (ROBAXIN ) tablet 500 mg (500 mg Oral Given 05/27/24 1806)                                    Medical Decision Making Amount and/or Complexity of Data Reviewed Labs: ordered. Radiology: ordered.  Risk OTC drugs. Prescription drug management.   This patient presents to the ED with chief complaint(s) of left-sided scapular pain with pertinent past medical history of hypertension, hyperlipidemia, diabetes.patient is wanting to make sure she did not have a heart attack.  Initial EKG is reassuring, and has not expiratory or epic.  The complaint involves an extensive differential diagnosis and also carries with it a high risk of complications and morbidity.    The differential diagnosis considered for this patient includes  ACS syndrome Musculoskeletal pain  The initial plan is to get delta troponin, x-ray of the chest.  Patient has clear reproducible pain of the left scapular region with palpation, but she wants to make sure she did not have a heart attack and we will get delta Trope for her given her risk factors.  If however the workup is negative then we will advise PCP follow-up as needed.  Records reviewed cardiac records, there is no  evidence of any echo or stress test in our system.  Independent labs interpretation:  The following labs were independently interpreted: Troponin x 2 normal.  Independent visualization and interpretation of imaging: - I independently visualized the following imaging with scope of interpretation limited to determining acute life threatening conditions related to emergency care: X-ray of the chest, which revealed no pneumothorax.  Treatment and Reassessment: Patient's second troponin is still pending.  The lab said that the sample is hemolyzed.  Patient wants to go home.  Uncomfortable with her leaving, I will follow-up on the second troponin.  Reassessment -second troponin is negative.    Final diagnoses:  Muscle spasm of back    ED Discharge Orders          Ordered    lidocaine  4 %  2 times daily        05/27/24 2046    ibuprofen  (ADVIL ) 400 MG tablet  Every 6 hours PRN        05/27/24 2046    acetaminophen  (TYLENOL  8 HOUR) 650 MG CR tablet  Every 8 hours PRN        05/27/24 2046    methocarbamol  (ROBAXIN ) 500 MG tablet  2 times daily        05/27/24 2046               Charlyn Sora, MD 05/27/24 2339

## 2024-05-27 NOTE — ED Triage Notes (Signed)
 Pt reports L sided CP after eating a hot dog with a lot of salt yesterday.

## 2024-05-27 NOTE — Discharge Instructions (Signed)
 We saw you in the ER for the left back pain. All of our cardiac workup is normal, including labs, EKG and chest X-RAY are normal.  We suspect that your symptoms are because of muscle spasms.. The workup in the ER is not complete, and you should follow up with your primary care doctor for further evaluation.

## 2024-05-30 NOTE — Telephone Encounter (Signed)
**Note De-identified  [place] Obfuscation** Please advise 

## 2024-05-30 NOTE — Telephone Encounter (Signed)
 Patient notified of update  and verbalized understanding. Pt stated she will let us  know what she decides to do when she come in Friday.

## 2024-06-01 ENCOUNTER — Ambulatory Visit
Admission: RE | Admit: 2024-06-01 | Discharge: 2024-06-01 | Disposition: A | Discharge status: Home / Self Care | Source: Ambulatory Visit | Attending: Adult Health | Admitting: Adult Health

## 2024-06-01 ENCOUNTER — Telehealth: Payer: Self-pay

## 2024-06-01 DIAGNOSIS — Z1231 Encounter for screening mammogram for malignant neoplasm of breast: Secondary | ICD-10-CM

## 2024-06-01 NOTE — Transitions of Care (Post Inpatient/ED Visit) (Signed)
   06/01/2024  Name: Cathy [name] MRN: [id number] DOB: [birth year]  Today's TOC FU Call Status: Today's TOC FU Call Status:: Unsuccessful Call (1st Attempt) Unsuccessful Call (1st Attempt) Date: 06/01/24  Attempted to reach the patient regarding the most recent Inpatient/ED visit.  Follow Up Plan: Additional outreach attempts will be made to reach the patient to complete the Transitions of Care (Post Inpatient/ED visit) call.   Signature [employer]

## 2024-06-02 ENCOUNTER — Ambulatory Visit (INDEPENDENT_AMBULATORY_CARE_PROVIDER_SITE_OTHER): Admitting: Adult Health

## 2024-06-02 ENCOUNTER — Encounter: Payer: Self-pay | Admitting: Adult Health

## 2024-06-02 ENCOUNTER — Telehealth: Payer: Self-pay

## 2024-06-02 VITALS — BP 130/78 | HR 78 | Temp 98.0°F | Ht 63.0 in | Wt 158.0 lb

## 2024-06-02 DIAGNOSIS — T148XXA Other injury of unspecified body region, initial encounter: Secondary | ICD-10-CM | POA: Diagnosis not present

## 2024-06-02 MED ORDER — METHOCARBAMOL 500 MG PO TABS: 500.0000 mg | ORAL_TABLET | Freq: Three times a day (TID) | ORAL | 0 refills | Status: AC

## 2024-06-02 NOTE — Progress Notes (Signed)
 Subjective:    Patient ID: Cathy [name] Hint, female    DOB: 06/28/1946, 78 y.o.   MRN: [id number]  HPI 78 year old female who  has a past medical history of Chronic constipation, DJD (degenerative joint disease), Female cystocele, GERD (gastroesophageal reflux disease), History of esophagitis, History of left inguinal hernia, History of MRSA infection, History of recurrent UTIs, Hyperlipidemia, Hypertension, Pre-diabetes, and Urgency of urination.  Presents to the office today after being seen in the emergency room 6 days ago with a chief complaint of chest pain.  When she was seen in the emergency room she reported that she was eating food the day prior and started to have gas type feeling.  She went to bed and started having left-sided scapular pain.  Her gas pain resolved after she took some over-the-counter medications but that the patient's pain became worse with any Vivity/movement.  She denied any shortness of breath.  She reported that she did do some modified push-ups recently  In the ER her EKG was reassuring.  Troponin x 2 was normal.  Chest x-ray showed no cardiopulmonary disease.  Exam she had reproducible pain to the left scapular region with palpation.  She was prescribed a lidocaine  patch, Robaxin , and naproxen   Today she reports that she is feeling better overall continues to have pain in the left shoulder/upper back.  Was doing a lot of stretching exercises and has found that the muscle relaxers are helpful but she is almost out of the.  She is also using heat and lidocaine  patches and notices that this is helping.   Review of Systems See HPI   Past Medical History:  Diagnosis Date   Chronic constipation    DJD (degenerative joint disease)    knees   Female cystocele    GERD (gastroesophageal reflux disease)    History of esophagitis    History of left inguinal hernia    History of MRSA infection    recurrent carbuncle   History of recurrent UTIs    Hyperlipidemia     Hypertension    Pre-diabetes    diet controlled   Urgency of urination     Social History   Socioeconomic History   Marital status: Single    Spouse name: Not on file   Number of children: Not on file   Years of education: Not on file   Highest education level: Not on file  Occupational History   Not on file  Tobacco Use   Smoking status: Former    Current packs/day: 0.00    Types: Cigarettes    Start date: 02/28/1995    Quit date: 02/28/1996    Years since quitting: 28.2   Smokeless tobacco: Never  Vaping Use   Vaping status: Never Used  Substance and Sexual Activity   Alcohol use: Yes    Alcohol/week: 4.0 standard drinks of alcohol    Types: 4 Standard drinks or equivalent per week    Comment: on weekends   Drug use: No   Sexual activity: Yes    Birth control/protection: Surgical  Other Topics Concern   Not on file  Social History Narrative   Single   Former Smoker  -  quit 10 to 11 years ago (light smoker)   Alcohol use-yes     2 children    Occupation: [employer]    Social Drivers of Health   Financial Resource Strain: Low Risk  (09/06/2023)   Overall Financial Resource Strain (CARDIA)  Difficulty of Paying Living Expenses: Not hard at all  Food Insecurity: No Food Insecurity (09/06/2023)   Hunger Vital Sign    Worried About Running Out of Food in the Last Year: Never true    Ran Out of Food in the Last Year: Never true  Transportation Needs: No Transportation Needs (09/06/2023)   PRAPARE - Administrator, Civil Service (Medical): No    Lack of Transportation (Non-Medical): No  Physical Activity: Insufficiently Active (09/06/2023)   Exercise Vital Sign    Days of Exercise per Week: 3 days    Minutes of Exercise per Session: 30 min  Stress: No Stress Concern Present (09/06/2023)   [employer] of Occupational Health - Occupational Stress Questionnaire    Feeling of Stress : Not at all  Social Connections: Moderately  Integrated (09/06/2023)   Social Connection and Isolation Panel    Frequency of Communication with Friends and Family: More than three times a week    Frequency of Social Gatherings with Friends and Family: More than three times a week    Attends Religious Services: More than 4 times per year    Active Member of Clubs or Organizations: Yes    Attends Banker Meetings: More than 4 times per year    Marital Status: Never married  Intimate Partner Violence: Not At Risk (09/06/2023)   Humiliation, Afraid, Rape, and Kick questionnaire    Fear of Current or Ex-Partner: No    Emotionally Abused: No    Physically Abused: No    Sexually Abused: No    Past Surgical History:  Procedure Laterality Date   APPENDECTOMY  1980's   BREAST EXCISIONAL BIOPSY Left 1989   COLONOSCOPY  last one 06-18-2015   INGUINAL HERNIA REPAIR Left 05-10-2001   LAPAROSCOPIC LYSIS OF ADHESIONS  01/17/2019   OOPHORECTOMY Right 01/17/2019   PERINEOPLASTY  01/17/2019   SALPINGECTOMY Left 01/17/2019   TOTAL HIP ARTHROPLASTY Right 04/09/2020   Procedure: RIGH TOTAL HIP ARTHROPLASTY ANTERIOR APPROACH;  Surgeon: Sheril Coy, MD;  Location: [facility];  Service: Orthopedics;  Laterality: Right;   TOTAL HIP ARTHROPLASTY Left 01/28/2021   Procedure: LEFT TOTAL HIP ARTHROPLASTY ANTERIOR APPROACH;  Surgeon: Sheril Coy, MD;  Location: [facility];  Service: Orthopedics;  Laterality: Left;   TOTAL KNEE ARTHROPLASTY Left 07/20/2023   Procedure: LEFT TOTAL KNEE ARTHROPLASTY;  Surgeon: Sheril Coy, MD;  Location: [facility];  Service: Orthopedics;  Laterality: Left;   VAGINAL HYSTERECTOMY  1980's   VAGINAL PROLAPSE REPAIR N/A 03/02/2016   Procedure: COLOPLAST ANTERIOR  VAULT REPAIR WITH AXIS DERMIS. SACROSPINUS FIXATION, AUGMENTATION WITH AXIS DERMIS;  Surgeon: Arlena Gal, MD;  Location: [facility];  Service: Urology;  Laterality: N/A;    Family History  Problem Relation Age of Onset   Aneurysm  Mother 27       deceased secondary to brain aneurysm   Liver cancer Father 55       deceased   Diabetes Other        grandmother   Colon cancer Neg Hx    Stomach cancer Neg Hx    Rectal cancer Neg Hx    Esophageal cancer Neg Hx    Breast cancer Neg Hx     Allergies  Allergen Reactions   Flexeril  [Cyclobenzaprine ]     Hallucinations    Lisinopril  Cough   Norvasc  [Amlodipine ] Swelling    Lower extremity edema    Penicillins Hives    Has patient had a PCN reaction  causing immediate rash, facial/tongue/throat swelling, SOB or lightheadedness with hypotension: Yes Has patient had a PCN reaction causing severe rash involving mucus membranes or skin necrosis: No Has patient had a PCN reaction that required hospitalization: No Has patient had a PCN reaction occurring within the last 10 years: No If all of the above answers are NO, then may proceed with Cephalosporin use.     Current Outpatient Medications on File Prior to Visit  Medication Sig Dispense Refill   acetaminophen  (TYLENOL  8 HOUR) 650 MG CR tablet Take 1 tablet (650 mg total) by mouth every 8 (eight) hours as needed for pain or fever. 30 tablet 0   Ascorbic Acid (VITAMIN C) 1000 MG tablet Take 1,000 mg by mouth daily.     aspirin  81 MG tablet Take 1 tablet (81 mg total) by mouth 2 (two) times daily after a meal. 60 tablet 0   b complex vitamins capsule Take 1 capsule by mouth daily.     benzonatate  (TESSALON ) 200 MG capsule Take 1 capsule (200 mg total) by mouth 3 (three) times daily as needed for cough. 30 capsule 1   Biotin 1000 MCG tablet Take 1,000 mcg by mouth daily.     Blood Glucose Monitoring Suppl (ONETOUCH VERIO FLEX SYSTEM) w/Device KIT TEST TID DAILY 1 kit 0   Calcium  Carbonate-Vitamin D  600-400 MG-UNIT tablet Take 1 tablet by mouth daily.     Chromium 1000 MCG TABS Take 1,000 mcg by mouth daily.     Cinnamon 500 MG capsule Take 1,000 mg by mouth daily.     CRANBERRY PO Take 2 tablets by mouth daily. 4200 mg      DULoxetine  (CYMBALTA ) 20 MG capsule TAKE 1 CAPSULE(20 MG) BY MOUTH DAILY 90 capsule 0   ELDERBERRY PO Take 1 capsule by mouth daily.     estradiol  (ESTRACE ) 0.1 MG/GM vaginal cream Place 1 Applicatorful vaginally every other day. In the evening  3   fluticasone  (FLONASE ) 50 MCG/ACT nasal spray SHAKE LIQUID AND USE 2 SPRAYS IN EACH NOSTRIL DAILY 16 g 6   gabapentin  (NEURONTIN ) 100 MG capsule Take 1-3 capsules (100-300 mg total) by mouth at bedtime as needed. 30 capsule 3   glucose blood (ONETOUCH VERIO) test strip 1 each by Other route 2 (two) times daily. Use as instructed 200 strip 3   ibuprofen  (ADVIL ) 400 MG tablet Take 1 tablet (400 mg total) by mouth every 6 (six) hours as needed. 20 tablet 0   Lancets (ONETOUCH DELICA PLUS LANCET33G) MISC USE ONCE DAILY 100 each 3   lidocaine  4 % Place 1 patch onto the skin 2 (two) times daily. 12 patch 0   meclizine  (ANTIVERT ) 25 MG tablet TAKE 1 TABLET(25 MG) BY MOUTH THREE TIMES DAILY AS NEEDED FOR DIZZINESS 30 tablet 1   methocarbamol  (ROBAXIN ) 500 MG tablet Take 1 tablet (500 mg total) by mouth 2 (two) times daily. 10 tablet 0   Misc Natural Products (TART CHERRY ADVANCED) CAPS Take 2 capsules by mouth daily.     Multiple Vitamins-Minerals (MULTIVITAMIN WITH MINERALS) tablet Take 1 tablet by mouth daily. Centrum silver     MYRBETRIQ  25 MG TB24 tablet Take 25 mg by mouth at bedtime.     olmesartan  (BENICAR ) 20 MG tablet TAKE 1 TABLET(20 MG) BY MOUTH DAILY 90 tablet 1   OVER THE COUNTER MEDICATION Take 1 tablet by mouth daily as needed (constipation). Vital Lax     polyethylene glycol (MIRALAX  / GLYCOLAX ) packet Take 17 g by mouth  daily as needed for mild constipation.     rosuvastatin  (CRESTOR ) 40 MG tablet Take 1 tablet (40 mg total) by mouth daily. 90 tablet 3   Turmeric 500 MG CAPS Take 1,000 mg by mouth daily.      [DISCONTINUED] valsartan  (DIOVAN ) 160 MG tablet Take 1/2 tablet by mouth once daily 30 tablet 5   No current facility-administered  medications on file prior to visit.    BP 130/78   Pulse 78   Temp 98 F (36.7 C) (Oral)   Ht 5' 3 (1.6 m)   Wt 158 lb (71.7 kg)   SpO2 98%   BMI 27.99 kg/m       Objective:   Physical Exam Vitals and nursing note reviewed.  Constitutional:      Appearance: Normal appearance.  Cardiovascular:     Rate and Rhythm: Normal rate and regular rhythm.     Pulses: Normal pulses.     Heart sounds: Normal heart sounds.  Pulmonary:     Effort: Pulmonary effort is normal.     Breath sounds: Normal breath sounds.  Musculoskeletal:       Back:  Skin:    General: Skin is warm and dry.  Neurological:     General: No focal deficit present.     Mental Status: She is alert and oriented to person, place, and time.  Psychiatric:        Mood and Affect: Mood normal.        Behavior: Behavior normal.        Thought Content: Thought content normal.        Judgment: Judgment normal.        Assessment & Plan:  1. Muscle strain (Primary) - Continue with stretching exercises, warm compresses and lidocaine  patches. Will refill Robaxin   - Follow up if not resolved in the next 4-7 days  - methocarbamol  (ROBAXIN ) 500 MG tablet; Take 1 tablet (500 mg total) by mouth 3 (three) times daily.  Dispense: 30 tablet; Refill: 0  Darleene Shape, NP

## 2024-06-02 NOTE — Transitions of Care (Post Inpatient/ED Visit) (Signed)
   06/02/2024  Name: Cathy [name] MRN: [id number] DOB: 1946-10-01  Today's TOC FU Call Status: Today's TOC FU Call Status:: Unsuccessful Call (2nd Attempt) Unsuccessful Call (2nd Attempt) Date: 06/02/24  Attempted to reach the patient regarding the most recent Inpatient/ED visit.  Follow Up Plan: Additional outreach attempts will be made to reach the patient to complete the Transitions of Care (Post Inpatient/ED visit) call.   Signature  [employer]

## 2024-06-05 DIAGNOSIS — M533 Sacrococcygeal disorders, not elsewhere classified: Secondary | ICD-10-CM | POA: Diagnosis not present

## 2024-06-05 DIAGNOSIS — M791 Myalgia, unspecified site: Secondary | ICD-10-CM | POA: Diagnosis not present

## 2024-06-13 ENCOUNTER — Other Ambulatory Visit: Payer: Self-pay | Admitting: Adult Health

## 2024-06-15 DIAGNOSIS — M533 Sacrococcygeal disorders, not elsewhere classified: Secondary | ICD-10-CM | POA: Diagnosis not present

## 2024-07-04 ENCOUNTER — Ambulatory Visit (INDEPENDENT_AMBULATORY_CARE_PROVIDER_SITE_OTHER): Admitting: Audiology

## 2024-07-04 ENCOUNTER — Ambulatory Visit (INDEPENDENT_AMBULATORY_CARE_PROVIDER_SITE_OTHER): Admitting: Otolaryngology

## 2024-07-04 ENCOUNTER — Encounter (INDEPENDENT_AMBULATORY_CARE_PROVIDER_SITE_OTHER): Payer: Self-pay | Admitting: Otolaryngology

## 2024-07-04 VITALS — BP 121/71 | HR 74 | Ht 65.0 in | Wt 156.0 lb

## 2024-07-04 DIAGNOSIS — H903 Sensorineural hearing loss, bilateral: Secondary | ICD-10-CM | POA: Diagnosis not present

## 2024-07-04 DIAGNOSIS — H9313 Tinnitus, bilateral: Secondary | ICD-10-CM | POA: Insufficient documentation

## 2024-07-04 DIAGNOSIS — H6123 Impacted cerumen, bilateral: Secondary | ICD-10-CM | POA: Diagnosis not present

## 2024-07-04 NOTE — Progress Notes (Signed)
 CC: Bilateral tinnitus  HPI:  Cathy [name] is a 78 y.o. female who presents today complaining of bilateral tinnitus for the past year.  The patient describes her tinnitus as an intermittent high-pitched ringing noise.  It is nonpulsatile.  She denies any otalgia, otorrhea, or vertigo.  She has no recent otitis media or otitis externa.  She has no previous otologic surgery.  Past Medical History:  Diagnosis Date   Chronic constipation    DJD (degenerative joint disease)    knees   Female cystocele    GERD (gastroesophageal reflux disease)    History of esophagitis    History of left inguinal hernia    History of MRSA infection    recurrent carbuncle   History of recurrent UTIs    Hyperlipidemia    Hypertension    Pre-diabetes    diet controlled   Urgency of urination     Past Surgical History:  Procedure Laterality Date   APPENDECTOMY  1980's   BREAST EXCISIONAL BIOPSY Left 1989   COLONOSCOPY  last one 06-18-2015   INGUINAL HERNIA REPAIR Left 05-10-2001   LAPAROSCOPIC LYSIS OF ADHESIONS  01/17/2019   OOPHORECTOMY Right 01/17/2019   PERINEOPLASTY  01/17/2019   SALPINGECTOMY Left 01/17/2019   TOTAL HIP ARTHROPLASTY Right 04/09/2020   Procedure: RIGH TOTAL HIP ARTHROPLASTY ANTERIOR APPROACH;  Surgeon: Sheril Coy, MD;  Location: [facility];  Service: Orthopedics;  Laterality: Right;   TOTAL HIP ARTHROPLASTY Left 01/28/2021   Procedure: LEFT TOTAL HIP ARTHROPLASTY ANTERIOR APPROACH;  Surgeon: Sheril Coy, MD;  Location: [facility];  Service: Orthopedics;  Laterality: Left;   TOTAL KNEE ARTHROPLASTY Left 07/20/2023   Procedure: LEFT TOTAL KNEE ARTHROPLASTY;  Surgeon: Sheril Coy, MD;  Location: [facility];  Service: Orthopedics;  Laterality: Left;   VAGINAL HYSTERECTOMY  1980's   VAGINAL PROLAPSE REPAIR N/A 03/02/2016   Procedure: COLOPLAST ANTERIOR  VAULT REPAIR WITH AXIS DERMIS. SACROSPINUS FIXATION, AUGMENTATION WITH AXIS DERMIS;  Surgeon: Arlena Gal, MD;  Location:  [facility];  Service: Urology;  Laterality: N/A;    Family History  Problem Relation Age of Onset   Aneurysm Mother 16       deceased secondary to brain aneurysm   Liver cancer Father 67       deceased   Diabetes Other        grandmother   Colon cancer Neg Hx    Stomach cancer Neg Hx    Rectal cancer Neg Hx    Esophageal cancer Neg Hx    Breast cancer Neg Hx     Social History:  reports that she quit smoking about 28 years ago. Her smoking use included cigarettes. She started smoking about 29 years ago. She has never used smokeless tobacco. She reports current alcohol use of about 4.0 standard drinks of alcohol per week. She reports that she does not use drugs.  Allergies:  Allergies  Allergen Reactions   Flexeril  [Cyclobenzaprine ]     Hallucinations    Lisinopril  Cough   Norvasc  [Amlodipine ] Swelling    Lower extremity edema    Penicillins Hives    Has patient had a PCN reaction causing immediate rash, facial/tongue/throat swelling, SOB or lightheadedness with hypotension: Yes Has patient had a PCN reaction causing severe rash involving mucus membranes or skin necrosis: No Has patient had a PCN reaction that required hospitalization: No Has patient had a PCN reaction occurring within the last 10 years: No If all of the above answers are NO, then may  proceed with Cephalosporin use.     Prior to Admission medications   Medication Sig Start Date End Date Taking? Authorizing Provider  acetaminophen  (TYLENOL  8 HOUR) 650 MG CR tablet Take 1 tablet (650 mg total) by mouth every 8 (eight) hours as needed for pain or fever. 05/27/24  Yes Charlyn Sora, MD  Ascorbic Acid (VITAMIN C) 1000 MG tablet Take 1,000 mg by mouth daily.   Yes [provider]  aspirin  81 MG tablet Take 1 tablet (81 mg total) by mouth 2 (two) times daily after a meal. 07/20/23  Yes Lenis Barter, PA-C  b complex vitamins capsule Take 1 capsule by mouth daily.   Yes [provider]  benzonatate  (TESSALON ) 200 MG capsule Take 1 capsule (200 mg total) by mouth 3 (three) times daily as needed for cough. 04/20/24  Yes Nafziger, Darleene, NP  Biotin 1000 MCG tablet Take 1,000 mcg by mouth daily.   Yes [provider]  Blood Glucose Monitoring Suppl (ONETOUCH VERIO FLEX SYSTEM) [name]/Device KIT TEST TID DAILY 03/22/24  Yes Nafziger, Darleene, NP  Calcium  Carbonate-Vitamin D  600-400 MG-UNIT tablet Take 1 tablet by mouth daily.   Yes [provider]  Chromium 1000 MCG TABS Take 1,000 mcg by mouth daily.   Yes [provider]  Cinnamon 500 MG capsule Take 1,000 mg by mouth daily.   Yes [provider]  CRANBERRY PO Take 2 tablets by mouth daily. 4200 mg   Yes [provider]  DULoxetine  (CYMBALTA ) 20 MG capsule TAKE 1 CAPSULE(20 MG) BY MOUTH DAILY 08/10/23  Yes Nafziger, Darleene, NP  ELDERBERRY PO Take 1 capsule by mouth daily.   Yes [provider]  estradiol  (ESTRACE ) 0.1 MG/GM vaginal cream Place 1 Applicatorful vaginally every other day. In the evening 08/29/18  Yes [provider]  fluticasone  (FLONASE ) 50 MCG/ACT nasal spray SHAKE LIQUID AND USE 2 SPRAYS IN EACH NOSTRIL DAILY 10/19/23  Yes Nafziger, Darleene, NP  gabapentin  (NEURONTIN ) 100 MG capsule Take 1-3 capsules (100-300 mg total) by mouth at bedtime as needed. 11/02/23  Yes Corey, Evan S, MD  glucose blood (ONETOUCH VERIO) test strip 1 each by Other route 2 (two) times daily. Use as instructed 03/21/24  Yes Nafziger, Darleene, NP  ibuprofen  (ADVIL ) 400 MG tablet Take 1 tablet (400 mg total) by mouth every 6 (six) hours as needed. 05/27/24  Yes Charlyn Sora, MD  Lancets (ONETOUCH DELICA PLUS LANCET33G) MISC USE ONCE DAILY 03/21/24  Yes Nafziger, Darleene, NP  lidocaine  4 % Place 1 patch onto the skin 2 (two) times daily. 05/27/24  Yes Nanavati, Ankit, MD  meclizine  (ANTIVERT ) 25 MG tablet TAKE 1 TABLET(25 MG) BY MOUTH THREE TIMES DAILY AS NEEDED FOR DIZZINESS 05/03/24  Yes Nafziger, Darleene, NP   Misc Natural Products (TART CHERRY ADVANCED) CAPS Take 2 capsules by mouth daily.   Yes [provider]  Multiple Vitamins-Minerals (MULTIVITAMIN WITH MINERALS) tablet Take 1 tablet by mouth daily. Centrum silver   Yes [provider]  MYRBETRIQ  25 MG TB24 tablet Take 25 mg by mouth at bedtime.   Yes [provider]  olmesartan  (BENICAR ) 20 MG tablet TAKE 1 TABLET(20 MG) BY MOUTH DAILY 02/15/24  Yes Nafziger, Darleene, NP  OVER THE COUNTER MEDICATION Take 1 tablet by mouth daily as needed (constipation). Vital Lax   Yes [provider]  polyethylene glycol (MIRALAX  / GLYCOLAX ) packet Take 17 g by mouth daily as needed for mild constipation.   Yes [provider]  rosuvastatin  (  CRESTOR ) 40 MG tablet TAKE 1 TABLET(40 MG) BY MOUTH DAILY 06/13/24  Yes Nafziger, Darleene, NP  Turmeric 500 MG CAPS Take 1,000 mg by mouth daily.    Yes [provider]  valsartan  (DIOVAN ) 160 MG tablet Take 1/2 tablet by mouth once daily 12/18/11 02/12/12  O'Sullivan, Melissa, NP    Blood pressure 121/71, pulse 74, height 5' 5 (1.651 m), weight 156 lb (70.8 kg), SpO2 95%. Exam: General: Communicates without difficulty, well nourished, no acute distress. Head: Normocephalic, no evidence injury, no tenderness, facial buttresses intact without stepoff. Face/sinus: No tenderness to palpation and percussion. Facial movement is normal and symmetric. Eyes: PERRL, EOMI. No scleral icterus, conjunctivae clear. Neuro: CN II exam reveals vision grossly intact.  No nystagmus at any point of gaze. Ears: Auricles well formed without lesions.  Bilateral cerumen impaction.  Nose: External evaluation reveals normal support and skin without lesions.  Dorsum is intact.  Anterior rhinoscopy reveals normal mucosa over anterior aspect of inferior turbinates and intact septum.  No purulence noted. Oral:  Oral cavity and oropharynx are intact, symmetric, without erythema or edema.  Mucosa is moist without  lesions. Neck: Full range of motion without pain.  There is no significant lymphadenopathy.  No masses palpable.  Thyroid  bed within normal limits to palpation.  Parotid glands and submandibular glands equal bilaterally without mass.  Trachea is midline. Neuro:  CN 2-12 grossly intact.   Procedure: Bilateral cerumen disimpaction Anesthesia: None Description: Under the operating microscope, the cerumen is carefully removed with a combination of cerumen currette, alligator forceps, and suction catheters.  After the cerumen is removed, the TMs are noted to be normal.  No mass, erythema, or lesions. The patient tolerated the procedure well.    Her hearing test shows bilateral high-frequency sensorineural hearing loss.  Assessment: 1.  Bilateral high-frequency sensorineural hearing loss, likely secondary to routine presbycusis. 2.  Her tinnitus is likely a direct result of her hearing loss. 3.  Incidental finding of bilateral cerumen impaction.  After the cerumen removal procedure, both tympanic membranes and middle ear spaces are noted to be normal.  Plan: 1.  Otomicroscopy with bilateral cerumen disimpaction. 2.  The physical exam findings and the hearing test results are reviewed with the patient. 3.  The strategies to cope with tinnitus, including the use of masker, hearing aids, tinnitus retraining therapy, and avoidance of caffeine and alcohol are discussed.  4.  The patient will return for reevaluation in 1 year.  Cathy [name] Cathy [name] 07/04/2024, 1:38 PM

## 2024-07-04 NOTE — Progress Notes (Signed)
  [address], [address] [place], [place] [address] [phone]  Audiological Evaluation    Name: Cathy [name]     DOB:   06/24/1946      MRN:   [id number]                                                                                     Service Date: 07/04/2024     Accompanied by: unaccompanied   Patient comes today after Dr. Karis, ENT sent a referral for a hearing evaluation due to concerns with hearing loss.   Symptoms Yes Details  Hearing loss  []  Not perceived by patient  Tinnitus  [x]  Tinnitus in both ears for years  Ear pain/ infections/pressure  []    Balance problems  []    Noise exposure history  []    Previous ear surgeries  []    Family history of hearing loss  []    Amplification  []    Other  []      Otoscopy: Right ear: Clear external ear canal and notable landmarks visualized on the tympanic membrane. Left ear:  Clear external ear canal and notable landmarks visualized on the tympanic membrane.  Tympanometry: Right ear: Type A- Normal external ear canal volume with normal middle ear pressure and tympanic membrane compliance. Left ear: Type A- Normal external ear canal volume with normal middle ear pressure and tympanic membrane compliance.   Pure tone Audiometry: Right ear- Normal to moderately severe sensorineural hearing loss from 250 Hz - 8000 Hz. Left ear-  Normal to moderately severe sensorineural hearing loss from 250 Hz - 8000 Hz.  Speech Audiometry: Right ear- Speech Reception Threshold (SRT) was obtained at 25 dBHL. Left ear-Speech Reception Threshold (SRT) was obtained at 20 dBHL.   Word Recognition Score Tested using NU-6 (recorded) Right ear: 100% was obtained at a presentation level of 70 dBHL with contralateral masking which is deemed as  excellent. Left ear: 100% was obtained at a presentation level of 70 dBHL with contralateral masking which is deemed as  excellent.   The hearing test results were completed under headphones and  results are deemed to be of good to fair reliability. Test technique:  conventional     Recommendations: Follow up with ENT as scheduled for today. Return for a hearing evaluation if concerns with hearing changes arise or per MD recommendation. Consider a communication needs assessment after medical clearance for hearing aids is obtained, when patient is ready.   Mathayus Stanbery MARIE LEROUX-MARTINEZ, AUD

## 2024-07-14 ENCOUNTER — Telehealth: Payer: Self-pay | Admitting: Family Medicine

## 2024-07-14 NOTE — Telephone Encounter (Signed)
 Patient called stating that she is having reoccurring pain in her fingers. She is not scheduled for another injection until 9/4. But asked if there was something Dr Claudene could call in that might would help?  Please advise.

## 2024-07-18 ENCOUNTER — Other Ambulatory Visit: Payer: Self-pay | Admitting: Adult Health

## 2024-07-18 DIAGNOSIS — H8113 Benign paroxysmal vertigo, bilateral: Secondary | ICD-10-CM

## 2024-07-18 MED ORDER — MECLIZINE HCL 25 MG PO TABS: ORAL_TABLET | ORAL | 1 refills | Status: DC

## 2024-07-18 NOTE — Telephone Encounter (Signed)
 Patient is calling to ask if she can get another shot in her hands if she came this week for an appointment. She can not use her hands. Please advise.

## 2024-07-18 NOTE — Telephone Encounter (Signed)
 Copied from CRM [id number]. Topic: Clinical - Medication Refill >> Jul 18, 2024  9:53 AM Robinson H wrote: Medication: meclizine  (ANTIVERT ) 25 MG tablet  Has the patient contacted their pharmacy? Yes, provider hadn't refill medication (Agent: If no, request that the patient contact the pharmacy for the refill. If patient does not wish to contact the pharmacy document the reason why and proceed with request.) (Agent: If yes, when and what did the pharmacy advise?)  This is the patient's preferred pharmacy:  [facility] [address], [place] - [address] [place] [address] Phone: [phone] Fax: [phone]   Is this the correct pharmacy for this prescription? Yes If no, delete pharmacy and type the correct one.   Has the prescription been filled recently? No  Is the patient out of the medication? Yes  Has the patient been seen for an appointment in the last year OR does the patient have an upcoming appointment? Yes  Can we respond through MyChart? No  Agent: Please be advised that Rx refills may take up to 3 business days. We ask that you follow-up with your pharmacy.

## 2024-07-19 ENCOUNTER — Encounter: Payer: Self-pay | Admitting: Family Medicine

## 2024-07-19 ENCOUNTER — Other Ambulatory Visit: Payer: Self-pay

## 2024-07-19 ENCOUNTER — Ambulatory Visit (INDEPENDENT_AMBULATORY_CARE_PROVIDER_SITE_OTHER): Admitting: Family Medicine

## 2024-07-19 VITALS — BP 142/82 | HR 84 | Ht 65.0 in | Wt 162.0 lb

## 2024-07-19 DIAGNOSIS — M25532 Pain in left wrist: Secondary | ICD-10-CM

## 2024-07-19 DIAGNOSIS — M25531 Pain in right wrist: Secondary | ICD-10-CM

## 2024-07-19 DIAGNOSIS — G5603 Carpal tunnel syndrome, bilateral upper limbs: Secondary | ICD-10-CM | POA: Diagnosis not present

## 2024-07-19 DIAGNOSIS — M1711 Unilateral primary osteoarthritis, right knee: Secondary | ICD-10-CM

## 2024-07-19 NOTE — Progress Notes (Addendum)
 [name] [address] [place] [address] Phone: [phone] Subjective:   Cathy [name], am serving as a scribe for Dr. Arthea Claudene.  I'm seeing this patient by the request  of:  Nafziger, Darleene, NP  CC: Bilateral hand pain and knee pain  YEP:[name]  Cathy [name] is a 77 y.o. female coming in with complaint of B hand pain. Patient states L>R wrist pain. Started one week ago. Ortho visit Oct 9th.       Past Medical History:  Diagnosis Date   Chronic constipation    DJD (degenerative joint disease)    knees   Female cystocele    GERD (gastroesophageal reflux disease)    History of esophagitis    History of left inguinal hernia    History of MRSA infection    recurrent carbuncle   History of recurrent UTIs    Hyperlipidemia    Hypertension    Pre-diabetes    diet controlled   Urgency of urination    Past Surgical History:  Procedure Laterality Date   APPENDECTOMY  1980's   BREAST EXCISIONAL BIOPSY Left 1989   COLONOSCOPY  last one 06-18-2015   INGUINAL HERNIA REPAIR Left 05-10-2001   LAPAROSCOPIC LYSIS OF ADHESIONS  01/17/2019   OOPHORECTOMY Right 01/17/2019   PERINEOPLASTY  01/17/2019   SALPINGECTOMY Left 01/17/2019   TOTAL HIP ARTHROPLASTY Right 04/09/2020   Procedure: RIGH TOTAL HIP ARTHROPLASTY ANTERIOR APPROACH;  Surgeon: Sheril Coy, MD;  Location: [facility];  Service: Orthopedics;  Laterality: Right;   TOTAL HIP ARTHROPLASTY Left 01/28/2021   Procedure: LEFT TOTAL HIP ARTHROPLASTY ANTERIOR APPROACH;  Surgeon: Sheril Coy, MD;  Location: [facility];  Service: Orthopedics;  Laterality: Left;   TOTAL KNEE ARTHROPLASTY Left 07/20/2023   Procedure: LEFT TOTAL KNEE ARTHROPLASTY;  Surgeon: Sheril Coy, MD;  Location: [facility];  Service: Orthopedics;  Laterality: Left;   VAGINAL HYSTERECTOMY  1980's   VAGINAL PROLAPSE REPAIR N/A 03/02/2016   Procedure: COLOPLAST ANTERIOR  VAULT REPAIR WITH AXIS DERMIS. SACROSPINUS  FIXATION, AUGMENTATION WITH AXIS DERMIS;  Surgeon: Arlena Gal, MD;  Location: [facility];  Service: Urology;  Laterality: N/A;   Social History   Socioeconomic History   Marital status: Single    Spouse name: Not on file   Number of children: Not on file   Years of education: Not on file   Highest education level: Not on file  Occupational History   Not on file  Tobacco Use   Smoking status: Former    Current packs/day: 0.00    Types: Cigarettes    Start date: 02/28/1995    Quit date: 02/28/1996    Years since quitting: 28.4   Smokeless tobacco: Never  Vaping Use   Vaping status: Never Used  Substance and Sexual Activity   Alcohol use: Yes    Alcohol/week: 4.0 standard drinks of alcohol    Types: 4 Standard drinks or equivalent per week    Comment: on weekends   Drug use: No   Sexual activity: Yes    Birth control/protection: Surgical  Other Topics Concern   Not on file  Social History Narrative   Single   Former Smoker  -  quit 10 to 11 years ago (light smoker)   Alcohol use-yes     2 children    Occupation: [employer]    Social Drivers of Health   Financial Resource Strain: Low Risk  (09/06/2023)   Overall  Financial Resource Strain (CARDIA)    Difficulty of Paying Living Expenses: Not hard at all  Food Insecurity: No Food Insecurity (09/06/2023)   Hunger Vital Sign    Worried About Running Out of Food in the Last Year: Never true    Ran Out of Food in the Last Year: Never true  Transportation Needs: No Transportation Needs (09/06/2023)   PRAPARE - Administrator, Civil Service (Medical): No    Lack of Transportation (Non-Medical): No  Physical Activity: Insufficiently Active (09/06/2023)   Exercise Vital Sign    Days of Exercise per Week: 3 days    Minutes of Exercise per Session: 30 min  Stress: No Stress Concern Present (09/06/2023)   [employer] of Occupational Health - Occupational Stress Questionnaire     Feeling of Stress : Not at all  Social Connections: Moderately Integrated (09/06/2023)   Social Connection and Isolation Panel    Frequency of Communication with Friends and Family: More than three times a week    Frequency of Social Gatherings with Friends and Family: More than three times a week    Attends Religious Services: More than 4 times per year    Active Member of [employer] or Organizations: Yes    Attends Engineer, structural: More than 4 times per year    Marital Status: Never married   Allergies  Allergen Reactions   Flexeril  [Cyclobenzaprine ]     Hallucinations    Lisinopril  Cough   Norvasc  [Amlodipine ] Swelling    Lower extremity edema    Penicillins Hives    Has patient had a PCN reaction causing immediate rash, facial/tongue/throat swelling, SOB or lightheadedness with hypotension: Yes Has patient had a PCN reaction causing severe rash involving mucus membranes or skin necrosis: No Has patient had a PCN reaction that required hospitalization: No Has patient had a PCN reaction occurring within the last 10 years: No If all of the above answers are NO, then may proceed with Cephalosporin use.    Family History  Problem Relation Age of Onset   Aneurysm Mother 68       deceased secondary to brain aneurysm   Liver cancer Father 48       deceased   Diabetes Other        grandmother   Colon cancer Neg Hx    Stomach cancer Neg Hx    Rectal cancer Neg Hx    Esophageal cancer Neg Hx    Breast cancer Neg Hx      Current Outpatient Medications (Cardiovascular):    olmesartan  (BENICAR ) 20 MG tablet, TAKE 1 TABLET(20 MG) BY MOUTH DAILY   rosuvastatin  (CRESTOR ) 40 MG tablet, TAKE 1 TABLET(40 MG) BY MOUTH DAILY  Current Outpatient Medications (Respiratory):    benzonatate  (TESSALON ) 200 MG capsule, Take 1 capsule (200 mg total) by mouth 3 (three) times daily as needed for cough.   fluticasone  (FLONASE ) 50 MCG/ACT nasal spray, SHAKE LIQUID AND USE 2 SPRAYS IN  EACH NOSTRIL DAILY  Current Outpatient Medications (Analgesics):    acetaminophen  (TYLENOL  8 HOUR) 650 MG CR tablet, Take 1 tablet (650 mg total) by mouth every 8 (eight) hours as needed for pain or fever.   aspirin  81 MG tablet, Take 1 tablet (81 mg total) by mouth 2 (two) times daily after a meal.   ibuprofen  (ADVIL ) 400 MG tablet, Take 1 tablet (400 mg total) by mouth every 6 (six) hours as needed.   Current Outpatient Medications (Other):    Ascorbic  Acid (VITAMIN C) 1000 MG tablet, Take 1,000 mg by mouth daily.   b complex vitamins capsule, Take 1 capsule by mouth daily.   Biotin 1000 MCG tablet, Take 1,000 mcg by mouth daily.   Blood Glucose Monitoring Suppl (ONETOUCH VERIO FLEX SYSTEM) w/Device KIT, TEST TID DAILY   Calcium  Carbonate-Vitamin D  600-400 MG-UNIT tablet, Take 1 tablet by mouth daily.   Chromium 1000 MCG TABS, Take 1,000 mcg by mouth daily.   Cinnamon 500 MG capsule, Take 1,000 mg by mouth daily.   CRANBERRY PO, Take 2 tablets by mouth daily. 4200 mg   DULoxetine  (CYMBALTA ) 20 MG capsule, TAKE 1 CAPSULE(20 MG) BY MOUTH DAILY   ELDERBERRY PO, Take 1 capsule by mouth daily.   estradiol  (ESTRACE ) 0.1 MG/GM vaginal cream, Place 1 Applicatorful vaginally every other day. In the evening   gabapentin  (NEURONTIN ) 100 MG capsule, Take 1-3 capsules (100-300 mg total) by mouth at bedtime as needed.   glucose blood (ONETOUCH VERIO) test strip, 1 each by Other route 2 (two) times daily. Use as instructed   Lancets (ONETOUCH DELICA PLUS LANCET33G) MISC, USE ONCE DAILY   lidocaine  4 %, Place 1 patch onto the skin 2 (two) times daily.   meclizine  (ANTIVERT ) 25 MG tablet, TAKE 1 TABLET(25 MG) BY MOUTH THREE TIMES DAILY AS NEEDED FOR DIZZINESS   Misc Natural Products (TART CHERRY ADVANCED) CAPS, Take 2 capsules by mouth daily.   Multiple Vitamins-Minerals (MULTIVITAMIN WITH MINERALS) tablet, Take 1 tablet by mouth daily. Centrum silver   MYRBETRIQ  25 MG TB24 tablet, Take 25 mg by mouth at  bedtime.   OVER THE COUNTER MEDICATION, Take 1 tablet by mouth daily as needed (constipation). Vital Lax   polyethylene glycol (MIRALAX  / GLYCOLAX ) packet, Take 17 g by mouth daily as needed for mild constipation.   Turmeric 500 MG CAPS, Take 1,000 mg by mouth daily.    Reviewed prior external information including notes and imaging from  primary care provider As well as notes that were available from care everywhere and other healthcare systems.  Past medical history, social, surgical and family history all reviewed in electronic medical record.  No pertanent information unless stated regarding to the chief complaint.   Review of Systems:  No headache, visual changes, nausea, vomiting, diarrhea, constipation, dizziness, abdominal pain, skin rash, fevers, chills, night sweats, weight loss, swollen lymph nodes, body aches, joint swelling, chest pain, shortness of breath, mood changes. POSITIVE muscle aches  Objective  Blood pressure (!) 142/82, pulse 84, height 5' 5 (1.651 m), weight 162 lb (73.5 kg), SpO2 98%.   General: No apparent distress alert and oriented x3 mood and affect normal, dressed appropriately.  HEENT: Pupils equal, extraocular movements intact  Respiratory: Patient's speak in full sentences and does not appear short of breath  Antalgic gait noted.  Right knee does have arthritic changes noted.  Some crepitus noted. Bilateral wrist does have positive Tinel's noted.  After informed written and verbal consent, patient was seated on exam table. Right knee was prepped with alcohol swab and utilizing anterolateral approach, patient's right knee space was injected with 4:1  marcaine  0.5%: Kenalog  40mg /dL. Patient tolerated the procedure well without immediate complications.  Procedure: Real-time Ultrasound Guided Injection of right carpal tunnel Device: GE Logiq Q7  Ultrasound guided injection is preferred based studies that show increased duration, increased effect, greater  accuracy, decreased procedural pain, increased response rate with ultrasound guided versus blind injection.  Verbal informed consent obtained.  Time-out conducted.  Noted no  overlying erythema, induration, or other signs of local infection.  Skin prepped in a sterile fashion.  Local anesthesia: Topical Ethyl chloride.  With sterile technique and under real time ultrasound guidance:  median nerve visualized.  23g 5/8 inch needle inserted distal to proximal approach into nerve sheath. Pictures taken nfor needle placement. Patient did have injection of  0.5 cc of 0.5% Marcaine , and 0.5 cc of Kenalog  40 mg/dL. Completed without difficulty  Pain immediately resolved suggesting accurate placement of the medication.  Advised to call if fevers/chills, erythema, induration, drainage, or persistent bleeding.  Images saved Impression: Technically successful ultrasound guided injection.    Procedure: Real-time Ultrasound Guided Injection of left carpal tunnel Device: GE Logiq Q7 Ultrasound guided injection is preferred based studies that show increased duration, increased effect, greater accuracy, decreased procedural pain, increased response rate with ultrasound guided versus blind injection.  Verbal informed consent obtained.  Time-out conducted.  Noted no overlying erythema, induration, or other signs of local infection.  Skin prepped in a sterile fashion.  Local anesthesia: Topical Ethyl chloride.  With sterile technique and under real time ultrasound guidance:  median nerve visualized.  23g 5/8 inch needle inserted distal to proximal approach into nerve sheath. Pictures taken nfor needle placement. Patient did have injection of 0.5 cc of 0.5% Marcaine , and 1 cc of Kenalog  40 mg/dL. Completed without difficulty  Pain immediately resolved suggesting accurate placement of the medication.  Advised to call if fevers/chills, erythema, induration, drainage, or persistent bleeding.  Images permanently  stored  Impression: Technically successful ultrasound guided injection.     Impression and Recommendations:     The above documentation has been reviewed and is accurate and complete Yousif Edelson M Valoria Tamburri, DO

## 2024-07-19 NOTE — Assessment & Plan Note (Signed)
 Bilateral, is going to see surgeon in October.  Getting injections bilaterally and did have a driver today.  Has responded well to them previously.  Hopeful that patient will continue to improve.  Has been 3 months since we have done the injections.  Follow-up with me again 3 months if needed.

## 2024-07-19 NOTE — Assessment & Plan Note (Signed)
 Repeat injection today.  Wants to avoid any type of surgical intervention.  Discussed icing regimen and home exercises.  Increase activity slowly.  Follow-up again 3 months.

## 2024-07-19 NOTE — Telephone Encounter (Signed)
 Patient scheduled today in cancellation.

## 2024-07-20 ENCOUNTER — Telehealth: Payer: Self-pay

## 2024-07-20 NOTE — Telephone Encounter (Signed)
 Patient ran for Durolane for right knee on 07/20/24. Case ID: [id number]. Pending approval.

## 2024-07-20 NOTE — Telephone Encounter (Signed)
-----   Message from Berwyn Posey sent at 07/20/2024  3:40 PM EDT ----- Regarding: visco Can you please run patient for visco for R knee?  Thank you!

## 2024-07-27 ENCOUNTER — Ambulatory Visit: Admitting: Family Medicine

## 2024-07-27 ENCOUNTER — Other Ambulatory Visit: Payer: Self-pay | Admitting: Adult Health

## 2024-07-27 NOTE — Telephone Encounter (Unsigned)
 Copied from CRM [id number]. Topic: Clinical - Medication Refill >> Jul 27, 2024  4:15 PM [place] J wrote: Medication: Cinnamon 500 MG capsule  Has the patient contacted their pharmacy? Yes (Agent: If no, request that the patient contact the pharmacy for the refill. If patient does not wish to contact the pharmacy document the reason why and proceed with request.) (Agent: If yes, when and what did the pharmacy advise?) Patient's pharmacy informed her that they have tried to send the request to us  on multiple occasions.  This is the patient's preferred pharmacy:  [facility] [address] [place] - [address] [place] [address] Phone: [phone] Fax: [phone]  Is this the correct pharmacy for this prescription? Yes If no, delete pharmacy and type the correct one.   Has the prescription been filled recently? No  Is the patient out of the medication? Yes  Has the patient been seen for an appointment in the last year OR does the patient have an upcoming appointment? Yes  Can we respond through MyChart? No  Agent: Please be advised that Rx refills may take up to 3 business days. We ask that you follow-up with your pharmacy.

## 2024-07-27 NOTE — Telephone Encounter (Signed)
 Patient needs an appointment once medication is stocked.  Durolane approved for right knee.  Primary: Patient has Fully Funded Dual Complete Medicare Advantage [employer] Calendar Year Plan with an effective date of 12/25/2023. Plan follows Medicare Guidelines.Specialist office visits, Durolane [phone] and procedures 20610/20611 are covered at 80% of the allowable. Deductibles do not apply to these services. If out of pocket is met, coverage goes to 100% of allowable amount. No pre-cert or referrals needed. Medical notes may be requested at the time of claims processing. Provider (Dr. Arthea Ozell Sharps, NPI # [id number]) is In Network. [facility] NPI [id number] & Tax ID# [id number] ) is In Network.  Secondary: Patient has Medicaid as secondary insurance. Member?s responsibilities left over by Medicare can be billed to Medicaid (If eligible). Member will be covered at 100% of the allowable amount.  Ref: [id number] Case ID: [id number] Exp: 01/21/2024

## 2024-07-27 NOTE — Telephone Encounter (Signed)
 You fill this?

## 2024-07-28 NOTE — Telephone Encounter (Signed)
 Noted

## 2024-08-01 ENCOUNTER — Telehealth: Payer: Self-pay | Admitting: *Deleted

## 2024-08-01 DIAGNOSIS — N390 Urinary tract infection, site not specified: Secondary | ICD-10-CM | POA: Diagnosis not present

## 2024-08-01 DIAGNOSIS — R339 Retention of urine, unspecified: Secondary | ICD-10-CM | POA: Diagnosis not present

## 2024-08-01 NOTE — Telephone Encounter (Signed)
 Darleene does not prescribe OTC supplements or medication.

## 2024-08-01 NOTE — Telephone Encounter (Signed)
 Left detailed message informing  of update.

## 2024-08-01 NOTE — Telephone Encounter (Signed)
 Copied from CRM [id number]. Topic: Clinical - Prescription Issue >> Aug 01, 2024 12:14 PM Cathy [name] wrote: Reason for CRM: Cinnamon 500 MG capsule [[id number]]  Patient is calling to follow up on whether NP Darleene will fill the prescription request for Cinnamon.   [phone] (M)

## 2024-08-01 NOTE — Telephone Encounter (Signed)
Called pt twice no answer.

## 2024-08-04 ENCOUNTER — Telehealth (INDEPENDENT_AMBULATORY_CARE_PROVIDER_SITE_OTHER): Admitting: Adult Health

## 2024-08-04 VITALS — Ht 65.0 in | Wt 162.0 lb

## 2024-08-04 DIAGNOSIS — T148XXA Other injury of unspecified body region, initial encounter: Secondary | ICD-10-CM

## 2024-08-04 DIAGNOSIS — J069 Acute upper respiratory infection, unspecified: Secondary | ICD-10-CM | POA: Diagnosis not present

## 2024-08-04 DIAGNOSIS — R051 Acute cough: Secondary | ICD-10-CM

## 2024-08-04 MED ORDER — METHOCARBAMOL 500 MG PO TABS: 500.0000 mg | ORAL_TABLET | Freq: Three times a day (TID) | ORAL | 1 refills | Status: AC | PRN

## 2024-08-04 MED ORDER — BENZONATATE 200 MG PO CAPS: 200.0000 mg | ORAL_CAPSULE | Freq: Three times a day (TID) | ORAL | 1 refills | Status: DC | PRN
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
## 2024-08-14 ENCOUNTER — Other Ambulatory Visit: Payer: Self-pay | Admitting: Adult Health

## 2024-08-14 DIAGNOSIS — I10 Essential (primary) hypertension: Secondary | ICD-10-CM

## 2024-08-14 NOTE — Telephone Encounter (Signed)
 Copied from CRM [id number]. Topic: Clinical - Medication Question >> Aug 14, 2024 11:25 AM Frederich PARAS wrote: Reason for CRM: pt does not know the name of blood pressure medicine she just says it starts with an o, I see 1 of the meds starting with o, pt have to verbally say the nameof the medication right ? to do the refill request .I could not do fill, pt did not know the name of medicatipon. Pt also disconnected the line. Pt callback # is [phone] she is out of the medication and doesn't know the name

## 2024-08-15 DIAGNOSIS — R350 Frequency of micturition: Secondary | ICD-10-CM | POA: Diagnosis not present

## 2024-08-29 NOTE — Progress Notes (Unsigned)
 [facility] [address] [place] [address] Phone: [phone] Subjective:   Cathy [name], am serving as a scribe for Dr. Arthea Claudene.  I'm seeing this patient by the request  of:  Merna Huxley, NP  CC: wrist and knee pain   YEP:[name]  07/19/2024 Repeat injection today.  Wants to avoid any type of surgical intervention.  Discussed icing regimen and home exercises.  Increase activity slowly.  Follow-up again 3 months.     Bilateral, is going to see surgeon in October.  Getting injections bilaterally and did have a driver today.  Has responded well to them previously.  Hopeful that patient will continue to improve.  Has been 3 months since we have done the injections.  Follow-up with me again 3 months if needed.     Updated 08/30/2024 Cathy [name] is a 78 y.o. female coming in with complaint of wrist and knee pain. Patient states that she sees Dr. Camella tomorrow. Has pain in the R lateral hip.        Past Medical History:  Diagnosis Date   Chronic constipation    DJD (degenerative joint disease)    knees   Female cystocele    GERD (gastroesophageal reflux disease)    History of esophagitis    History of left inguinal hernia    History of MRSA infection    recurrent carbuncle   History of recurrent UTIs    Hyperlipidemia    Hypertension    Pre-diabetes    diet controlled   Urgency of urination    Past Surgical History:  Procedure Laterality Date   APPENDECTOMY  1980's   BREAST EXCISIONAL BIOPSY Left 1989   COLONOSCOPY  last one 06-18-2015   INGUINAL HERNIA REPAIR Left 05-10-2001   LAPAROSCOPIC LYSIS OF ADHESIONS  01/17/2019   OOPHORECTOMY Right 01/17/2019   PERINEOPLASTY  01/17/2019   SALPINGECTOMY Left 01/17/2019   TOTAL HIP ARTHROPLASTY Right 04/09/2020   Procedure: RIGH TOTAL HIP ARTHROPLASTY ANTERIOR APPROACH;  Surgeon: Sheril Coy, MD;  Location: [facility];  Service: Orthopedics;  Laterality: Right;   TOTAL  HIP ARTHROPLASTY Left 01/28/2021   Procedure: LEFT TOTAL HIP ARTHROPLASTY ANTERIOR APPROACH;  Surgeon: Sheril Coy, MD;  Location: [facility];  Service: Orthopedics;  Laterality: Left;   TOTAL KNEE ARTHROPLASTY Left 07/20/2023   Procedure: LEFT TOTAL KNEE ARTHROPLASTY;  Surgeon: Sheril Coy, MD;  Location: [facility];  Service: Orthopedics;  Laterality: Left;   VAGINAL HYSTERECTOMY  1980's   VAGINAL PROLAPSE REPAIR N/A 03/02/2016   Procedure: COLOPLAST ANTERIOR  VAULT REPAIR WITH AXIS DERMIS. SACROSPINUS FIXATION, AUGMENTATION WITH AXIS DERMIS;  Surgeon: Arlena Gal, MD;  Location: [facility];  Service: Urology;  Laterality: N/A;   Social History   Socioeconomic History   Marital status: Single    Spouse name: Not on file   Number of children: Not on file   Years of education: Not on file   Highest education level: Not on file  Occupational History   Not on file  Tobacco Use   Smoking status: Former    Current packs/day: 0.00    Types: Cigarettes    Start date: 02/28/1995    Quit date: 02/28/1996    Years since quitting: 28.5   Smokeless tobacco: Never  Vaping Use   Vaping status: Never Used  Substance and Sexual Activity   Alcohol use: Yes    Alcohol/week: 4.0 standard drinks of alcohol    Types: 4 Standard drinks  or equivalent per week    Comment: on weekends   Drug use: No   Sexual activity: Yes    Birth control/protection: Surgical  Other Topics Concern   Not on file  Social History Narrative   Single   Former Smoker  -  quit 10 to 11 years ago (light smoker)   Alcohol use-yes     2 children    Occupation: [employer]    Social Drivers of Health   Financial Resource Strain: Low Risk  (09/06/2023)   Overall Financial Resource Strain (CARDIA)    Difficulty of Paying Living Expenses: Not hard at all  Food Insecurity: Low Risk  (08/01/2024)   Received from [facility]   Hunger Vital Sign    Within the past 12 months, you worried that  your food would run out before you got money to buy more: Never true    Within the past 12 months, the food you bought just didn't last and you didn't have money to get more. : Never true  Transportation Needs: No Transportation Needs (08/01/2024)   Received from [facility]    In the past 12 months, has lack of reliable transportation kept you from medical appointments, meetings, work or from getting things needed for daily living? : No  Physical Activity: Insufficiently Active (09/06/2023)   Exercise Vital Sign    Days of Exercise per Week: 3 days    Minutes of Exercise per Session: 30 min  Stress: No Stress Concern Present (09/06/2023)   [employer] of Occupational Health - Occupational Stress Questionnaire    Feeling of Stress : Not at all  Social Connections: Moderately Integrated (09/06/2023)   Social Connection and Isolation Panel    Frequency of Communication with Friends and Family: More than three times a week    Frequency of Social Gatherings with Friends and Family: More than three times a week    Attends Religious Services: More than 4 times per year    Active Member of [employer] or Organizations: Yes    Attends Engineer, structural: More than 4 times per year    Marital Status: Never married   Allergies  Allergen Reactions   Flexeril  [Cyclobenzaprine ]     Hallucinations    Lisinopril  Cough   Norvasc  [Amlodipine ] Swelling    Lower extremity edema    Penicillins Hives    Has patient had a PCN reaction causing immediate rash, facial/tongue/throat swelling, SOB or lightheadedness with hypotension: Yes Has patient had a PCN reaction causing severe rash involving mucus membranes or skin necrosis: No Has patient had a PCN reaction that required hospitalization: No Has patient had a PCN reaction occurring within the last 10 years: No If all of the above answers are NO, then may proceed with Cephalosporin use.    Family History  Problem  Relation Age of Onset   Aneurysm Mother 67       deceased secondary to brain aneurysm   Liver cancer Father 76       deceased   Diabetes Other        grandmother   Colon cancer Neg Hx    Stomach cancer Neg Hx    Rectal cancer Neg Hx    Esophageal cancer Neg Hx    Breast cancer Neg Hx      Current Outpatient Medications (Cardiovascular):    olmesartan  (BENICAR ) 20 MG tablet, TAKE 1 TABLET(20 MG) BY MOUTH DAILY   rosuvastatin  (CRESTOR ) 40  MG tablet, TAKE 1 TABLET(40 MG) BY MOUTH DAILY  Current Outpatient Medications (Respiratory):    benzonatate  (TESSALON ) 200 MG capsule, Take 1 capsule (200 mg total) by mouth 3 (three) times daily as needed for cough.   fluticasone  (FLONASE ) 50 MCG/ACT nasal spray, SHAKE LIQUID AND USE 2 SPRAYS IN EACH NOSTRIL DAILY  Current Outpatient Medications (Analgesics):    acetaminophen  (TYLENOL  8 HOUR) 650 MG CR tablet, Take 1 tablet (650 mg total) by mouth every 8 (eight) hours as needed for pain or fever.   aspirin  81 MG tablet, Take 1 tablet (81 mg total) by mouth 2 (two) times daily after a meal.   ibuprofen  (ADVIL ) 400 MG tablet, Take 1 tablet (400 mg total) by mouth every 6 (six) hours as needed.   Current Outpatient Medications (Other):    Ascorbic Acid (VITAMIN C) 1000 MG tablet, Take 1,000 mg by mouth daily.   b complex vitamins capsule, Take 1 capsule by mouth daily.   Biotin 1000 MCG tablet, Take 1,000 mcg by mouth daily.   Blood Glucose Monitoring Suppl (ONETOUCH VERIO FLEX SYSTEM) w/Device KIT, TEST TID DAILY   Calcium  Carbonate-Vitamin D  600-400 MG-UNIT tablet, Take 1 tablet by mouth daily.   Chromium 1000 MCG TABS, Take 1,000 mcg by mouth daily.   Cinnamon 500 MG capsule, Take 1,000 mg by mouth daily.   CRANBERRY PO, Take 2 tablets by mouth daily. 4200 mg   DULoxetine  (CYMBALTA ) 20 MG capsule, TAKE 1 CAPSULE(20 MG) BY MOUTH DAILY   ELDERBERRY PO, Take 1 capsule by mouth daily.   estradiol  (ESTRACE ) 0.1 MG/GM vaginal cream, Place 1  Applicatorful vaginally every other day. In the evening   gabapentin  (NEURONTIN ) 100 MG capsule, Take 1-3 capsules (100-300 mg total) by mouth at bedtime as needed.   glucose blood (ONETOUCH VERIO) test strip, 1 each by Other route 2 (two) times daily. Use as instructed   Lancets (ONETOUCH DELICA PLUS LANCET33G) MISC, USE ONCE DAILY   lidocaine  4 %, Place 1 patch onto the skin 2 (two) times daily.   meclizine  (ANTIVERT ) 25 MG tablet, TAKE 1 TABLET(25 MG) BY MOUTH THREE TIMES DAILY AS NEEDED FOR DIZZINESS   methocarbamol  (ROBAXIN ) 500 MG tablet, Take 1 tablet (500 mg total) by mouth every 8 (eight) hours as needed for muscle spasms.   Misc Natural Products (TART CHERRY ADVANCED) CAPS, Take 2 capsules by mouth daily.   Multiple Vitamins-Minerals (MULTIVITAMIN WITH MINERALS) tablet, Take 1 tablet by mouth daily. Centrum silver   MYRBETRIQ  25 MG TB24 tablet, Take 25 mg by mouth at bedtime.   OVER THE COUNTER MEDICATION, Take 1 tablet by mouth daily as needed (constipation). Vital Lax   polyethylene glycol (MIRALAX  / GLYCOLAX ) packet, Take 17 g by mouth daily as needed for mild constipation.   Turmeric 500 MG CAPS, Take 1,000 mg by mouth daily.    Reviewed prior external information including notes and imaging from  primary care provider As well as notes that were available from care everywhere and other healthcare systems.  Past medical history, social, surgical and family history all reviewed in electronic medical record.  No pertanent information unless stated regarding to the chief complaint.   Review of Systems:  No headache, visual changes, nausea, vomiting, diarrhea, constipation, dizziness, abdominal pain, skin rash, fevers, chills, night sweats, weight loss, swollen lymph nodes, body aches, joint swelling, chest pain, shortness of breath, mood changes. POSITIVE muscle aches  Objective  There were no vitals taken for this visit.   General:  No apparent distress alert and oriented x3 mood  and affect normal, dressed appropriately.  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 Right knee was prepped with alcohol swab and utilizing anterolateral approach, patient's right knee space was injected with 60 mg per 3 mL of durolane (sodium hyaluronate) in a prefilled syringe was injected easily into the knee through a 22-gauge needle..Patient tolerated the procedure well without immediate complications.   Impression and Recommendations:    The above documentation has been reviewed and is accurate and complete Christiann [name] M Davontae Prusinski, DO

## 2024-08-30 ENCOUNTER — Ambulatory Visit (INDEPENDENT_AMBULATORY_CARE_PROVIDER_SITE_OTHER): Admitting: Family Medicine

## 2024-08-30 ENCOUNTER — Encounter: Payer: Self-pay | Admitting: Family Medicine

## 2024-08-30 VITALS — BP 118/78 | HR 78 | Ht 65.0 in

## 2024-08-30 DIAGNOSIS — M1711 Unilateral primary osteoarthritis, right knee: Secondary | ICD-10-CM | POA: Diagnosis not present

## 2024-08-30 MED ORDER — SODIUM HYALURONATE 60 MG/3ML IX PRSY
60.0000 mg | PREFILLED_SYRINGE | Freq: Once | INTRA_ARTICULAR | Status: AC
  Administered 2024-08-30: 60 mg via INTRA_ARTICULAR

## 2024-08-30 NOTE — Patient Instructions (Signed)
 Durolane injection for R knee today See me in

## 2024-08-30 NOTE — Assessment & Plan Note (Signed)
 Patient given viscosupplementation and hopeful that this will make significant improvement.  Have done steroid injections previously.  Try to hold off for any type of surgical intervention.  He is going to be having intervention for the carpal tunnel here in the near future.  Follow-up with me again in 3 to 6 months

## 2024-08-31 ENCOUNTER — Telehealth: Payer: Self-pay | Admitting: Family Medicine

## 2024-08-31 NOTE — Telephone Encounter (Signed)
 Patient called to schedule injections for her wrists. Please confirm that the scheduled appt is appropriate for time between injections.

## 2024-09-07 ENCOUNTER — Other Ambulatory Visit: Payer: Self-pay

## 2024-09-07 MED ORDER — MELOXICAM 15 MG PO TABS
15.0000 mg | ORAL_TABLET | Freq: Every day | ORAL | 0 refills | Status: AC | PRN
  Filled 2024-09-07: qty 60, 60d supply, fill #0

## 2024-09-08 ENCOUNTER — Ambulatory Visit: Payer: 59

## 2024-09-08 ENCOUNTER — Other Ambulatory Visit: Payer: Self-pay

## 2024-09-08 VITALS — BP 124/78 | HR 73 | Temp 98.7°F | Ht 65.0 in | Wt 163.5 lb

## 2024-09-08 DIAGNOSIS — Z0000 Encounter for general adult medical examination without abnormal findings: Secondary | ICD-10-CM | POA: Diagnosis not present

## 2024-09-08 MED ORDER — MELOXICAM 15 MG PO TABS
15.0000 mg | ORAL_TABLET | Freq: Every day | ORAL | 0 refills | Status: AC | PRN
  Filled 2024-09-08: qty 60, 60d supply, fill #0

## 2024-09-08 NOTE — Patient Instructions (Addendum)
 Ms. Cathy [name],  Thank you for taking the time for your Medicare Wellness Visit. I appreciate your continued commitment to your health goals. Please review the care plan we discussed, and feel free to reach out if I can assist you further.  Medicare recommends these wellness visits once per year to help you and your care team stay ahead of potential health issues. These visits are designed to focus on prevention, allowing your provider to concentrate on managing your acute and chronic conditions during your regular appointments.  Please note that Annual Wellness Visits do not include a physical exam. Some assessments may be limited, especially if the visit was conducted virtually. If needed, we may recommend a separate in-person follow-up with your provider.  Ongoing Care Seeing your primary care provider every 3 to 6 months helps us  monitor your health and provide consistent, personalized care.   Referrals If a referral was made during today's visit and you haven't received any updates within two weeks, please contact the referred provider directly to check on the status.  Recommended Screenings:  Health Maintenance  Topic Date Due   COVID-19 Vaccine (4 - 2025-26 season) 07/24/2024   Hemoglobin A1C  09/13/2024   Eye exam for diabetics  10/25/2024   Yearly kidney health urinalysis for diabetes  03/14/2025   Complete foot exam   03/14/2025   Yearly kidney function blood test for diabetes  05/27/2025   Breast Cancer Screening  06/01/2025   Colon Cancer Screening  06/17/2025   Medicare Annual Wellness Visit  09/08/2025   DTaP/Tdap/Td vaccine (4 - Td or Tdap) 08/08/2032   Pneumococcal Vaccine for age over 53  Completed   Flu Shot  Completed   DEXA scan (bone density measurement)  Completed   Hepatitis C Screening  Completed   Zoster (Shingles) Vaccine  Completed   Meningitis B Vaccine  Aged Out       09/08/2024    8:24 AM  Advanced Directives  Does Patient Have a Medical Advance  Directive? No  Would patient like information on creating a medical advance directive? No - Patient declined   Advance Care Planning is important because it: Ensures you receive medical care that aligns with your values, goals, and preferences. Provides guidance to your family and loved ones, reducing the emotional burden of decision-making during critical moments.  Vision: Annual vision screenings are recommended for early detection of glaucoma, cataracts, and diabetic retinopathy. These exams can also reveal signs of chronic conditions such as diabetes and high blood pressure.  Dental: Annual dental screenings help detect early signs of oral cancer, gum disease, and other conditions linked to overall health, including heart disease and diabetes.  Please see the attached documents for additional preventive care recommendations.

## 2024-09-08 NOTE — Progress Notes (Signed)
 Subjective:   Cathy [name] is a 78 y.o. who presents for a Medicare Wellness preventive visit.  As a reminder, Annual Wellness Visits don't include a physical exam, and some assessments may be limited, especially if this visit is performed virtually. We may recommend an in-person follow-up visit with your provider if needed.  Visit Complete: In person    Persons Participating in Visit: Patient.  AWV Questionnaire: No: Patient Medicare AWV questionnaire was not completed prior to this visit.  Cardiac Risk Factors include: advanced age (>68men, >44 women);diabetes mellitus;hypertension     Objective:    Today's Vitals   09/08/24 0804  BP: 124/78  Pulse: 73  Temp: 98.7 F (37.1 C)  TempSrc: Oral  SpO2: 99%  Weight: 163 lb 8 oz (74.2 kg)  Height: 5' 5 (1.651 m)   Body mass index is 27.21 kg/m.     09/08/2024    8:24 AM 05/27/2024    4:13 PM 09/06/2023    8:31 AM 09/06/2023    4:14 AM 07/08/2023    7:50 AM 07/14/2022    8:44 AM 07/16/2021    3:03 PM  Advanced Directives  Does Patient Have a Medical Advance Directive? No No No No No No Yes  Type of Tax inspector;Living will  Copy of Healthcare Power of Attorney in Chart?       No - copy requested  Would patient like information on creating a medical advance directive? No - Patient declined  No - Patient declined   No - Patient declined     Current Medications (verified) Outpatient Encounter Medications as of 09/08/2024  Medication Sig   acetaminophen  (TYLENOL  8 HOUR) 650 MG CR tablet Take 1 tablet (650 mg total) by mouth every 8 (eight) hours as needed for pain or fever.   Ascorbic Acid (VITAMIN C) 1000 MG tablet Take 1,000 mg by mouth daily.   aspirin  81 MG tablet Take 1 tablet (81 mg total) by mouth 2 (two) times daily after a meal.   b complex vitamins capsule Take 1 capsule by mouth daily.   benzonatate  (TESSALON ) 200 MG capsule Take 1 capsule (200 mg total) by mouth 3  (three) times daily as needed for cough.   Biotin 1000 MCG tablet Take 1,000 mcg by mouth daily.   Blood Glucose Monitoring Suppl (ONETOUCH VERIO FLEX SYSTEM) w/Device KIT TEST TID DAILY   Calcium  Carbonate-Vitamin D  600-400 MG-UNIT tablet Take 1 tablet by mouth daily.   Chromium 1000 MCG TABS Take 1,000 mcg by mouth daily.   Cinnamon 500 MG capsule Take 1,000 mg by mouth daily.   CRANBERRY PO Take 2 tablets by mouth daily. 4200 mg   DULoxetine  (CYMBALTA ) 20 MG capsule TAKE 1 CAPSULE(20 MG) BY MOUTH DAILY   ELDERBERRY PO Take 1 capsule by mouth daily.   estradiol  (ESTRACE ) 0.1 MG/GM vaginal cream Place 1 Applicatorful vaginally every other day. In the evening   fluticasone  (FLONASE ) 50 MCG/ACT nasal spray SHAKE LIQUID AND USE 2 SPRAYS IN EACH NOSTRIL DAILY   gabapentin  (NEURONTIN ) 100 MG capsule Take 1-3 capsules (100-300 mg total) by mouth at bedtime as needed.   glucose blood (ONETOUCH VERIO) test strip 1 each by Other route 2 (two) times daily. Use as instructed   ibuprofen  (ADVIL ) 400 MG tablet Take 1 tablet (400 mg total) by mouth every 6 (six) hours as needed.   Lancets (ONETOUCH DELICA PLUS LANCET33G) MISC USE ONCE DAILY  lidocaine  4 % Place 1 patch onto the skin 2 (two) times daily.   meclizine  (ANTIVERT ) 25 MG tablet TAKE 1 TABLET(25 MG) BY MOUTH THREE TIMES DAILY AS NEEDED FOR DIZZINESS   meloxicam  (MOBIC ) 15 MG tablet Take 1 tablet (15 mg total) by mouth daily as needed.   methocarbamol  (ROBAXIN ) 500 MG tablet Take 1 tablet (500 mg total) by mouth every 8 (eight) hours as needed for muscle spasms.   Misc Natural Products (TART CHERRY ADVANCED) CAPS Take 2 capsules by mouth daily.   Multiple Vitamins-Minerals (MULTIVITAMIN WITH MINERALS) tablet Take 1 tablet by mouth daily. Centrum silver   MYRBETRIQ  25 MG TB24 tablet Take 25 mg by mouth at bedtime.   olmesartan  (BENICAR ) 20 MG tablet TAKE 1 TABLET(20 MG) BY MOUTH DAILY   OVER THE COUNTER MEDICATION Take 1 tablet by mouth daily as  needed (constipation). Vital Lax   polyethylene glycol (MIRALAX  / GLYCOLAX ) packet Take 17 g by mouth daily as needed for mild constipation.   rosuvastatin  (CRESTOR ) 40 MG tablet TAKE 1 TABLET(40 MG) BY MOUTH DAILY   Turmeric 500 MG CAPS Take 1,000 mg by mouth daily.    [DISCONTINUED] valsartan  (DIOVAN ) 160 MG tablet Take 1/2 tablet by mouth once daily   No facility-administered encounter medications on file as of 09/08/2024.    Allergies (verified) Flexeril  [cyclobenzaprine ], Lisinopril , Norvasc  [amlodipine ], and Penicillins   History: Past Medical History:  Diagnosis Date   Chronic constipation    DJD (degenerative joint disease)    knees   Female cystocele    GERD (gastroesophageal reflux disease)    History of esophagitis    History of left inguinal hernia    History of MRSA infection    recurrent carbuncle   History of recurrent UTIs    Hyperlipidemia    Hypertension    Pre-diabetes    diet controlled   Urgency of urination    Past Surgical History:  Procedure Laterality Date   APPENDECTOMY  1980's   BREAST EXCISIONAL BIOPSY Left 1989   COLONOSCOPY  last one 06-18-2015   INGUINAL HERNIA REPAIR Left 05-10-2001   LAPAROSCOPIC LYSIS OF ADHESIONS  01/17/2019   OOPHORECTOMY Right 01/17/2019   PERINEOPLASTY  01/17/2019   SALPINGECTOMY Left 01/17/2019   TOTAL HIP ARTHROPLASTY Right 04/09/2020   Procedure: RIGH TOTAL HIP ARTHROPLASTY ANTERIOR APPROACH;  Surgeon: Sheril Coy, MD;  Location: [facility];  Service: Orthopedics;  Laterality: Right;   TOTAL HIP ARTHROPLASTY Left 01/28/2021   Procedure: LEFT TOTAL HIP ARTHROPLASTY ANTERIOR APPROACH;  Surgeon: Sheril Coy, MD;  Location: [facility];  Service: Orthopedics;  Laterality: Left;   TOTAL KNEE ARTHROPLASTY Left 07/20/2023   Procedure: LEFT TOTAL KNEE ARTHROPLASTY;  Surgeon: Sheril Coy, MD;  Location: [facility];  Service: Orthopedics;  Laterality: Left;   VAGINAL HYSTERECTOMY  1980's   VAGINAL PROLAPSE REPAIR N/A 03/02/2016    Procedure: COLOPLAST ANTERIOR  VAULT REPAIR WITH AXIS DERMIS. SACROSPINUS FIXATION, AUGMENTATION WITH AXIS DERMIS;  Surgeon: Arlena Gal, MD;  Location: [facility];  Service: Urology;  Laterality: N/A;   Family History  Problem Relation Age of Onset   Aneurysm Mother 7       deceased secondary to brain aneurysm   Liver cancer Father 66       deceased   Diabetes Other        grandmother   Colon cancer Neg Hx    Stomach cancer Neg Hx    Rectal cancer Neg Hx    Esophageal cancer Neg  Hx    Breast cancer Neg Hx    Social History   Socioeconomic History   Marital status: Single    Spouse name: Not on file   Number of children: Not on file   Years of education: Not on file   Highest education level: Not on file  Occupational History   Not on file  Tobacco Use   Smoking status: Former    Current packs/day: 0.00    Types: Cigarettes    Start date: 02/28/1995    Quit date: 02/28/1996    Years since quitting: 28.5   Smokeless tobacco: Never  Vaping Use   Vaping status: Never Used  Substance and Sexual Activity   Alcohol use: Yes    Alcohol/week: 4.0 standard drinks of alcohol    Types: 4 Standard drinks or equivalent per week    Comment: on weekends   Drug use: No   Sexual activity: Yes    Birth control/protection: Surgical  Other Topics Concern   Not on file  Social History Narrative   Single   Former Smoker  -  quit 10 to 11 years ago (light smoker)   Alcohol use-yes     2 children    Occupation: [employer]    Social Drivers of Health   Financial Resource Strain: Low Risk  (09/08/2024)   Overall Financial Resource Strain (CARDIA)    Difficulty of Paying Living Expenses: Not hard at all  Food Insecurity: No Food Insecurity (09/08/2024)   Hunger Vital Sign    Worried About Running Out of Food in the Last Year: Never true    Ran Out of Food in the Last Year: Never true  Transportation Needs: No Transportation Needs (09/08/2024)    PRAPARE - Administrator, Civil Service (Medical): No    Lack of Transportation (Non-Medical): No  Physical Activity: Sufficiently Active (09/08/2024)   Exercise Vital Sign    Days of Exercise per Week: 3 days    Minutes of Exercise per Session: 60 min  Stress: No Stress Concern Present (09/08/2024)   [employer] of Occupational Health - Occupational Stress Questionnaire    Feeling of Stress: Not at all  Social Connections: Moderately Integrated (09/08/2024)   Social Connection and Isolation Panel    Frequency of Communication with Friends and Family: More than three times a week    Frequency of Social Gatherings with Friends and Family: More than three times a week    Attends Religious Services: More than 4 times per year    Active Member of [employer] or Organizations: Yes    Attends Engineer, structural: More than 4 times per year    Marital Status: Never married    Tobacco Counseling Counseling given: Not Answered    Clinical Intake:  Pre-visit preparation completed: Yes  Pain : No/denies pain     BMI - recorded: 27.21 Nutritional Status: BMI 25 -29 Overweight Nutritional Risks: None Diabetes: Yes CBG done?: No Did pt. bring in CBG monitor from home?: No  Lab Results  Component Value Date   HGBA1C 6.5 03/14/2024   HGBA1C 6.4 (A) 02/25/2023   HGBA1C 6.7 (H) 08/26/2022     How often do you need to have someone help you when you read instructions, pamphlets, or other written materials from your doctor or pharmacy?: 1 - Never  Interpreter Needed?: No  Information entered by :: Rojelio Blush LPN   Activities of Daily Living     09/08/2024  8:23 AM  In your present state of health, do you have any difficulty performing the following activities:  Hearing? 0  Vision? 0  Difficulty concentrating or making decisions? 0  Walking or climbing stairs? 0  Dressing or bathing? 0  Doing errands, shopping? 0  Preparing Food and eating ? N   Using the Toilet? N  In the past six months, have you accidently leaked urine? N  Do you have problems with loss of bowel control? N  Managing your Medications? N  Managing your Finances? N  Housekeeping or managing your Housekeeping? N    Patient Care Team: Merna Huxley, NP as PCP - General (Family Medicine) Carolee Sherwood JONETTA DOUGLAS, MD as Consulting Physician (Urology) [facility]  I have updated your Care Teams any recent Medical Services you may have received from other providers in the past year.     Assessment:   This is a routine wellness examination for Mahika.  Hearing/Vision screen Hearing Screening - Comments:: Denies hearing difficulties   Vision Screening - Comments:: Wears rx glasses - up to date with routine eye exams with  [facility]   Goals Addressed               This Visit's Progress     Increase physical activity (pt-stated)        Remain active       Depression Screen     09/08/2024    8:21 AM 09/06/2023    8:29 AM 09/15/2022    3:19 PM 08/26/2022    7:12 AM 07/14/2022    8:41 AM 08/13/2021    7:11 AM 11/01/2020   10:53 AM  PHQ 2/9 Scores  PHQ - 2 Score 0 0 0 0 0 1 0  PHQ- 9 Score    0  1     Fall Risk     09/08/2024    8:24 AM 09/06/2023    8:30 AM 09/15/2022    3:19 PM 08/26/2022    7:13 AM 07/14/2022    8:43 AM  Fall Risk   Falls in the past year? 1 0 0 0 1  Number falls in past yr: 0 0 0 0 0  Injury with Fall? 0 0 0 0 0  Risk for fall due to : No Fall Risks No Fall Risks Other (Comment) No Fall Risks No Fall Risks  Follow up Falls evaluation completed Falls prevention discussed Falls evaluation completed  Falls evaluation completed       Data saved with a previous flowsheet row definition    MEDICARE RISK AT HOME:  Medicare Risk at Home Any stairs in or around the home?: No If so, are there any without handrails?: No Home free of loose throw rugs in walkways, pet beds, electrical cords, etc?: Yes Adequate lighting in  your home to reduce risk of falls?: Yes Life alert?: No Use of a cane, walker or w/c?: No Grab bars in the bathroom?: Yes Shower chair or bench in shower?: No Elevated toilet seat or a handicapped toilet?: No  TIMED UP AND GO:  Was the test performed?  Yes  Length of time to ambulate 10 feet: 10 sec Gait steady and fast without use of assistive device  Cognitive Function: 6CIT completed        09/08/2024    8:24 AM 09/06/2023    8:31 AM 07/14/2022    8:44 AM 06/10/2020   10:00 AM  6CIT Screen  What Year? 0 points 0  points 0 points 0 points  What month? 0 points 0 points 0 points 0 points  What time? 0 points 0 points 0 points 0 points  Count back from 20 0 points 0 points 0 points 0 points  Months in reverse 0 points 0 points 0 points 0 points  Repeat phrase 4 points 0 points 0 points 2 points  Total Score 4 points 0 points 0 points 2 points    Immunizations Immunization History  Administered Date(s) Administered    sv, Bivalent, Protein Subunit Rsvpref,pf (Abrysvo) 08/03/2022   Fluad Quad(high Dose 65+) 07/28/2019, 07/30/2020, 08/13/2021, 08/24/2023   INFLUENZA, HIGH DOSE SEASONAL PF 08/26/2017, 08/26/2017, 08/06/2018   Influenza Inj Mdck Quad Pf 08/03/2022   Influenza Split 09/22/2011, 11/02/2012   Influenza Whole 08/27/2008, 08/29/2010   Influenza,inj,Quad PF,6+ Mos 10/18/2013, 07/17/2014   Influenza-Unspecified 08/17/2024   Moderna Sars-Covid-2 Vaccination 12/17/2019, 01/14/2020   Pneumococcal Conjugate-13 10/10/2015   Pneumococcal Polysaccharide-23 12/18/2011, 07/01/2017, 08/28/2017, 08/28/2017   Td 12/19/2007, 08/08/2022   Tdap 08/08/2022   Unspecified SARS-COV-2 Vaccination 08/08/2022   Zoster Recombinant(Shingrix) 08/06/2018, 08/06/2018, 10/06/2018, 10/10/2018, 10/10/2018   Zoster, Live 03/07/2015    Screening Tests Health Maintenance  Topic Date Due   COVID-19 Vaccine (4 - 2025-26 season) 07/24/2024   HEMOGLOBIN A1C  09/13/2024   OPHTHALMOLOGY EXAM   10/25/2024   Diabetic kidney evaluation - Urine ACR  03/14/2025   FOOT EXAM  03/14/2025   Diabetic kidney evaluation - eGFR measurement  05/27/2025   Mammogram  06/01/2025   Colonoscopy  06/17/2025   Medicare Annual Wellness (AWV)  09/08/2025   DTaP/Tdap/Td (4 - Td or Tdap) 08/08/2032   Pneumococcal Vaccine: 50+ Years  Completed   Influenza Vaccine  Completed   DEXA SCAN  Completed   Hepatitis C Screening  Completed   Zoster Vaccines- Shingrix  Completed   Meningococcal B Vaccine  Aged Out    Health Maintenance Items Addressed:   Additional Screening:  Vision Screening: Recommended annual ophthalmology exams for early detection of glaucoma and other disorders of the eye. Is the patient up to date with their annual eye exam?  Yes  Who is the provider or what is the name of the office in which the patient attends annual eye exams? [facility]  Dental Screening: Recommended annual dental exams for proper oral hygiene  Community Resource Referral / Chronic Care Management: CRR required this visit?  No   CCM required this visit?  No   Plan:    I have personally reviewed and noted the following in the patient's chart:   Medical and social history Use of alcohol, tobacco or illicit drugs  Current medications and supplements including opioid prescriptions. Patient is not currently taking opioid prescriptions. Functional ability and status Nutritional status Physical activity Advanced directives List of other physicians Hospitalizations, surgeries, and ER visits in previous 12 months Vitals Screenings to include cognitive, depression, and falls Referrals and appointments  In addition, I have reviewed and discussed with patient certain preventive protocols, quality metrics, and best practice recommendations. A written personalized care plan for preventive services as well as general preventive health recommendations were provided to patient.   Rojelio LELON Blush,  LPN   89/[id number]/[id number]   After Visit Summary: (In Person-Printed) AVS printed and given to the patient  Notes: Nothing significant to report at this time.

## 2024-09-15 DIAGNOSIS — M533 Sacrococcygeal disorders, not elsewhere classified: Secondary | ICD-10-CM | POA: Diagnosis not present

## 2024-09-18 ENCOUNTER — Other Ambulatory Visit: Payer: Self-pay

## 2024-09-27 ENCOUNTER — Other Ambulatory Visit: Payer: Self-pay | Admitting: Adult Health

## 2024-09-27 ENCOUNTER — Telehealth: Payer: Self-pay | Admitting: Adult Health

## 2024-09-27 DIAGNOSIS — H8113 Benign paroxysmal vertigo, bilateral: Secondary | ICD-10-CM

## 2024-09-27 NOTE — Telephone Encounter (Signed)
 Copied from CRM [id number]. Topic: Referral - Request for Referral >> Sep 27, 2024 11:45 AM Alfonso ORN wrote: Did the patient discuss referral with their provider in the last year? Yes   Appointment offered? Yes  Type of order/referral and detailed reason for visit: gynocologist  Preference of office, provider, location: [place] (woman)  If referral order, have you been seen by this specialty before? No (If Yes, this issue or another issue? When? Where?  Can we respond through MyChart? No

## 2024-09-27 NOTE — Telephone Encounter (Signed)
 Okay for referral?

## 2024-09-28 NOTE — Telephone Encounter (Signed)
 Pt stated she has been having left flank pain. Pt stated this happened to her in the past and you referred her to Gyn. Pt also stated that she think it is coming from her ovaries. Tried to get pt scheduled but she had to call me back. Will call pt tomorrow and send to PCP for advise.

## 2024-09-29 ENCOUNTER — Telehealth: Payer: Self-pay | Admitting: *Deleted

## 2024-09-29 ENCOUNTER — Telehealth: Payer: Self-pay

## 2024-09-29 DIAGNOSIS — E119 Type 2 diabetes mellitus without complications: Secondary | ICD-10-CM

## 2024-09-29 MED ORDER — LANCETS MISC: 0 refills | Status: AC

## 2024-09-29 MED ORDER — BLOOD GLUCOSE TEST VI STRP: ORAL_STRIP | 0 refills | Status: AC

## 2024-09-29 MED ORDER — LANCET DEVICE MISC: 0 refills | Status: AC

## 2024-09-29 MED ORDER — BLOOD GLUCOSE MONITORING SUPPL DEVI: 0 refills | Status: AC

## 2024-09-29 NOTE — Telephone Encounter (Signed)
 Left message to return phone call.

## 2024-09-29 NOTE — Telephone Encounter (Signed)
 Called pt again no answer. Left detailed message advising pt that new meter has been sent to pharmacy.

## 2024-09-29 NOTE — Telephone Encounter (Signed)
 Prescription sent in

## 2024-09-29 NOTE — Telephone Encounter (Signed)
 Copied from CRM [id number]. Topic: Clinical - Prescription Issue >> Sep 29, 2024 10:34 AM Berneda FALCON wrote: Reason for CRM: Patient states that the insurance will not pay for one touch strips anymore and that we need to order her another machine that would be covered by her insurance.  [id number] [address], [place] - [address] AT [address] [place] [address] Phone: [phone] Fax: [phone] Hours: Not open 24 hours  Patient callback is [phone] (home) >> Sep 29, 2024  4:01 PM Roselie BROCKS wrote: Patient returned call, I provided information that the new meter is sent to pharmacy . Patient understood

## 2024-09-29 NOTE — Telephone Encounter (Signed)
 Called pt again no answer. Left detailed message advising pt that she will need an appt.

## 2024-09-29 NOTE — Telephone Encounter (Signed)
 Copied from CRM [id number]. Topic: Clinical - Prescription Issue >> Sep 29, 2024 10:34 AM Berneda FALCON wrote: Reason for CRM: Patient states that the insurance will not pay for one touch strips anymore and that we need to order her another machine that would be covered by her insurance.  [id number] [address], [place] - [address] [place] [address] Phone: [phone] Fax: [phone] Hours: Not open 24 hours  Patient callback is [phone] (home)

## 2024-10-10 ENCOUNTER — Telehealth (INDEPENDENT_AMBULATORY_CARE_PROVIDER_SITE_OTHER): Admitting: Adult Health

## 2024-10-10 VITALS — Ht 65.0 in

## 2024-10-10 DIAGNOSIS — J0100 Acute maxillary sinusitis, unspecified: Secondary | ICD-10-CM

## 2024-10-10 MED ORDER — DOXYCYCLINE HYCLATE 100 MG PO CAPS: 100.0000 mg | ORAL_CAPSULE | Freq: Two times a day (BID) | ORAL | 0 refills | Status: AC

## 2024-10-10 NOTE — Progress Notes (Signed)
 Virtual Visit via Video Note  I connected with Cathy [name] on 10/10/24 at  8:30 AM EST by a video enabled telemedicine application and verified that I am speaking with the correct person using two identifiers.  Location patient: home Location provider:work or home office Persons participating in the virtual visit: patient, provider  I discussed the limitations of evaluation and management by telemedicine and the availability of in person appointments. The patient expressed understanding and agreed to proceed.   HPI: 78 year old female who  has a past medical history of Chronic constipation, DJD (degenerative joint disease), Female cystocele, GERD (gastroesophageal reflux disease), History of esophagitis, History of left inguinal hernia, History of MRSA infection, History of recurrent UTIs, Hyperlipidemia, Hypertension, Pre-diabetes, and Urgency of urination.  She is being evaluated today for an acute issue.   She reports that for over a week she has had a headache, sinus congestion, nasal bleeding, and semi productive cough. At home she has been using various OTC cold medication without any improvement. She feels as though her symptoms are getting worse. She has had diaphoresis but no documented fevers or chills.    ROS: See pertinent positives and negatives per HPI.  Past Medical History:  Diagnosis Date   Chronic constipation    DJD (degenerative joint disease)    knees   Female cystocele    GERD (gastroesophageal reflux disease)    History of esophagitis    History of left inguinal hernia    History of MRSA infection    recurrent carbuncle   History of recurrent UTIs    Hyperlipidemia    Hypertension    Pre-diabetes    diet controlled   Urgency of urination     Past Surgical History:  Procedure Laterality Date   APPENDECTOMY  1980's   BREAST EXCISIONAL BIOPSY Left 1989   COLONOSCOPY  last one 06-18-2015   INGUINAL HERNIA REPAIR Left 05-10-2001   LAPAROSCOPIC LYSIS  OF ADHESIONS  01/17/2019   OOPHORECTOMY Right 01/17/2019   PERINEOPLASTY  01/17/2019   SALPINGECTOMY Left 01/17/2019   TOTAL HIP ARTHROPLASTY Right 04/09/2020   Procedure: RIGH TOTAL HIP ARTHROPLASTY ANTERIOR APPROACH;  Surgeon: Sheril Coy, MD;  Location: [facility];  Service: Orthopedics;  Laterality: Right;   TOTAL HIP ARTHROPLASTY Left 01/28/2021   Procedure: LEFT TOTAL HIP ARTHROPLASTY ANTERIOR APPROACH;  Surgeon: Sheril Coy, MD;  Location: [facility];  Service: Orthopedics;  Laterality: Left;   TOTAL KNEE ARTHROPLASTY Left 07/20/2023   Procedure: LEFT TOTAL KNEE ARTHROPLASTY;  Surgeon: Sheril Coy, MD;  Location: [facility];  Service: Orthopedics;  Laterality: Left;   VAGINAL HYSTERECTOMY  1980's   VAGINAL PROLAPSE REPAIR N/A 03/02/2016   Procedure: COLOPLAST ANTERIOR  VAULT REPAIR WITH AXIS DERMIS. SACROSPINUS FIXATION, AUGMENTATION WITH AXIS DERMIS;  Surgeon: Arlena Gal, MD;  Location: [facility];  Service: Urology;  Laterality: N/A;    Family History  Problem Relation Age of Onset   Aneurysm Mother 28       deceased secondary to brain aneurysm   Liver cancer Father 37       deceased   Diabetes Other        grandmother   Colon cancer Neg Hx    Stomach cancer Neg Hx    Rectal cancer Neg Hx    Esophageal cancer Neg Hx    Breast cancer Neg Hx        Current Outpatient Medications:    acetaminophen  (TYLENOL  8 HOUR) 650 MG CR tablet, Take 1  tablet (650 mg total) by mouth every 8 (eight) hours as needed for pain or fever., Disp: 30 tablet, Rfl: 0   Ascorbic Acid (VITAMIN C) 1000 MG tablet, Take 1,000 mg by mouth daily., Disp: , Rfl:    aspirin  81 MG tablet, Take 1 tablet (81 mg total) by mouth 2 (two) times daily after a meal., Disp: 60 tablet, Rfl: 0   b complex vitamins capsule, Take 1 capsule by mouth daily., Disp: , Rfl:    benzonatate  (TESSALON ) 200 MG capsule, Take 1 capsule (200 mg total) by mouth 3 (three) times daily as needed for cough., Disp: 30  capsule, Rfl: 1   Biotin 1000 MCG tablet, Take 1,000 mcg by mouth daily., Disp: , Rfl:    Blood Glucose Monitoring Suppl DEVI, Dispense based on patient and insurance preference. Use up to four times daily as directed.  Dx E11.9, Disp: 1 each, Rfl: 0   Calcium  Carbonate-Vitamin D  600-400 MG-UNIT tablet, Take 1 tablet by mouth daily., Disp: , Rfl:    Chromium 1000 MCG TABS, Take 1,000 mcg by mouth daily., Disp: , Rfl:    Cinnamon 500 MG capsule, Take 1,000 mg by mouth daily., Disp: , Rfl:    CRANBERRY PO, Take 2 tablets by mouth daily. 4200 mg, Disp: , Rfl:    DULoxetine  (CYMBALTA ) 20 MG capsule, TAKE 1 CAPSULE(20 MG) BY MOUTH DAILY, Disp: 90 capsule, Rfl: 0   ELDERBERRY PO, Take 1 capsule by mouth daily., Disp: , Rfl:    estradiol  (ESTRACE ) 0.1 MG/GM vaginal cream, Place 1 Applicatorful vaginally every other day. In the evening, Disp: , Rfl: 3   fluticasone  (FLONASE ) 50 MCG/ACT nasal spray, SHAKE LIQUID AND USE 2 SPRAYS IN EACH NOSTRIL DAILY, Disp: 16 g, Rfl: 6   gabapentin  (NEURONTIN ) 100 MG capsule, Take 1-3 capsules (100-300 mg total) by mouth at bedtime as needed., Disp: 30 capsule, Rfl: 3   Glucose Blood (BLOOD GLUCOSE TEST STRIPS) STRP, Dispense based on patient and insurance preference. Use up to four times daily as directed. Dx E11.9, Disp: 100 strip, Rfl: 0   glucose blood (ONETOUCH VERIO) test strip, 1 each by Other route 2 (two) times daily. Use as instructed, Disp: 200 strip, Rfl: 3   ibuprofen  (ADVIL ) 400 MG tablet, Take 1 tablet (400 mg total) by mouth every 6 (six) hours as needed., Disp: 20 tablet, Rfl: 0   Lancet Device MISC, Dispense based on patient and insurance preference. Use up to four times daily as directed. Dx E11.9, Disp: 1 each, Rfl: 0   Lancets MISC, Dispense based on patient and insurance preference. Use up to four times daily as directed. Dx E11.9, Disp: 100 each, Rfl: 0   lidocaine  4 %, Place 1 patch onto the skin 2 (two) times daily., Disp: 12 patch, Rfl: 0    meclizine  (ANTIVERT ) 25 MG tablet, TAKE 1 TABLET(25 MG) BY MOUTH THREE TIMES DAILY AS NEEDED FOR DIZZINESS, Disp: 30 tablet, Rfl: 1   meloxicam  (MOBIC ) 15 MG tablet, Take 1 tablet (15 mg total) by mouth daily as needed., Disp: 60 tablet, Rfl: 0   meloxicam  (MOBIC ) 15 MG tablet, Take 1 tablet (15 mg total) by mouth daily as needed., Disp: 60 tablet, Rfl: 0   methocarbamol  (ROBAXIN ) 500 MG tablet, Take 1 tablet (500 mg total) by mouth every 8 (eight) hours as needed for muscle spasms., Disp: 30 tablet, Rfl: 1   Misc Natural Products (TART CHERRY ADVANCED) CAPS, Take 2 capsules by mouth daily., Disp: , Rfl:  Multiple Vitamins-Minerals (MULTIVITAMIN WITH MINERALS) tablet, Take 1 tablet by mouth daily. Centrum silver, Disp: , Rfl:    MYRBETRIQ  25 MG TB24 tablet, Take 25 mg by mouth at bedtime., Disp: , Rfl:    olmesartan  (BENICAR ) 20 MG tablet, TAKE 1 TABLET(20 MG) BY MOUTH DAILY, Disp: 90 tablet, Rfl: 1   OVER THE COUNTER MEDICATION, Take 1 tablet by mouth daily as needed (constipation). Vital Lax, Disp: , Rfl:    polyethylene glycol (MIRALAX  / GLYCOLAX ) packet, Take 17 g by mouth daily as needed for mild constipation., Disp: , Rfl:    rosuvastatin  (CRESTOR ) 40 MG tablet, TAKE 1 TABLET(40 MG) BY MOUTH DAILY, Disp: 90 tablet, Rfl: 1   Turmeric 500 MG CAPS, Take 1,000 mg by mouth daily. , Disp: , Rfl:   EXAM:  VITALS per patient if applicable:  GENERAL: alert, oriented, appears well and in no acute distress  HEENT: atraumatic, conjunttiva clear, no obvious abnormalities on inspection of external nose and ears  NECK: normal movements of the head and neck  LUNGS: on inspection no signs of respiratory distress, breathing rate appears normal, no obvious gross SOB, gasping or wheezing  CV: no obvious cyanosis  MS: moves all visible extremities without noticeable abnormality  PSYCH/NEURO: pleasant and cooperative, no obvious depression or anxiety, speech and thought processing grossly  intact  ASSESSMENT AND PLAN:  Discussed the following assessment and plan:  1. Acute non-recurrent maxillary sinusitis (Primary) - Will treat due to symptoms and duration - Follow up in the office if not improving in the next 3-4 days - doxycycline  (VIBRAMYCIN ) 100 MG capsule; Take 1 capsule (100 mg total) by mouth 2 (two) times daily.  Dispense: 14 capsule; Refill: 0      I discussed the assessment and treatment plan with the patient. The patient was provided an opportunity to ask questions and all were answered. The patient agreed with the plan and demonstrated an understanding of the instructions.   The patient was advised to call back or seek an in-person evaluation if the symptoms worsen or if the condition fails to improve as anticipated.   Yaritzel Stange, NP

## 2024-10-13 ENCOUNTER — Ambulatory Visit: Payer: Self-pay | Admitting: Adult Health

## 2024-10-13 ENCOUNTER — Ambulatory Visit (INDEPENDENT_AMBULATORY_CARE_PROVIDER_SITE_OTHER): Admitting: Adult Health

## 2024-10-13 VITALS — BP 138/80 | HR 78 | Temp 98.0°F | Ht 65.0 in | Wt 160.0 lb

## 2024-10-13 DIAGNOSIS — R1030 Lower abdominal pain, unspecified: Secondary | ICD-10-CM

## 2024-10-13 DIAGNOSIS — E119 Type 2 diabetes mellitus without complications: Secondary | ICD-10-CM

## 2024-10-13 LAB — POCT URINALYSIS DIPSTICK
Bilirubin, UA: NEGATIVE
Blood, UA: NEGATIVE
Glucose, UA: NEGATIVE
Ketones, UA: NEGATIVE
Leukocytes, UA: NEGATIVE
Nitrite, UA: NEGATIVE
Protein, UA: NEGATIVE
Spec Grav, UA: 1.01 (ref 1.010–1.025)
Urobilinogen, UA: 0.2 U/dL
pH, UA: 6.5 (ref 5.0–8.0)

## 2024-10-13 LAB — BASIC METABOLIC PANEL WITH GFR
BUN: 20 mg/dL (ref 6–23)
CO2: 29 meq/L (ref 19–32)
Calcium: 9.4 mg/dL (ref 8.4–10.5)
Chloride: 103 meq/L (ref 96–112)
Creatinine, Ser: 1.08 mg/dL (ref 0.40–1.20)
GFR: 50.08 mL/min — ABNORMAL LOW (ref >60.00)
Glucose, Bld: 99 mg/dL (ref 70–99)
Potassium: 4.7 meq/L (ref 3.5–5.1)
Sodium: 140 meq/L (ref 135–145)

## 2024-10-13 LAB — HEMOGLOBIN A1C: Hgb A1c MFr Bld: 6.3 % (ref 4.6–6.5)

## 2024-10-13 NOTE — Progress Notes (Signed)
 Subjective:    Patient ID: Cathy [name], female    DOB: [birth year], 78 y.o.   MRN: [id number]  HPI Discussed the use of AI scribe software for clinical note transcription with the patient, who gave verbal consent to proceed.  History of Present Illness   Cathy [name] is a 78 year old female who presents with multiple issues.    she would like to be referred to GYN in [place]. She is currently being seen by U[facility] G[facility] at [facility] but her provider will be going back and forth between [place] and [place] and she does not want to drive to [place].    She has also been experiencing a  soreness in the left lower abdomen for an unknown amount of time. Soreness sometimes radiating around the hip and abdomen. The pain is constant and worsens with walking. She had her right ovary removed in 2020 and vaginal hysterectomy done in the 1980's. She is concerned the pain may be coming from her left ovary. She does have a history of left total hip surgery, she reports that she saw her orthopedic doctor earlier this week and that the  xrays were normal - I do not have these results.    Additionally, she is due for follow up regarding DM type 2.  She is currently diet controlled with her last A1c being 6.4 in April 2025.        Review of Systems See HPI   Past Medical History:  Diagnosis Date   Chronic constipation    DJD (degenerative joint disease)    knees   Female cystocele    GERD (gastroesophageal reflux disease)    History of esophagitis    History of left inguinal hernia    History of MRSA infection    recurrent carbuncle   History of recurrent UTIs    Hyperlipidemia    Hypertension    Pre-diabetes    diet controlled   Urgency of urination     Social History   Socioeconomic History   Marital status: Single    Spouse name: Not on file   Number of children: Not on file   Years of education: Not on file   Highest education level: Not on file  Occupational History    Not on file  Tobacco Use   Smoking status: Former    Current packs/day: 0.00    Types: Cigarettes    Start date: 02/28/1995    Quit date: 02/28/1996    Years since quitting: 28.6   Smokeless tobacco: Never  Vaping Use   Vaping status: Never Used  Substance and Sexual Activity   Alcohol use: Yes    Alcohol/week: 4.0 standard drinks of alcohol    Types: 4 Standard drinks or equivalent per week    Comment: on weekends   Drug use: No   Sexual activity: Yes    Birth control/protection: Surgical  Other Topics Concern   Not on file  Social History Narrative   Single   Former Smoker  -  quit 10 to 11 years ago (light smoker)   Alcohol use-yes     2 children    Occupation: [employer]    Social Drivers of Health   Financial Resource Strain: Low Risk  (09/08/2024)   Overall Financial Resource Strain (CARDIA)    Difficulty of Paying Living Expenses: Not hard at all  Food Insecurity: No Food Insecurity (09/08/2024)   Hunger Vital Sign    Worried  About Running Out of Food in the Last Year: Never true    Ran Out of Food in the Last Year: Never true  Transportation Needs: No Transportation Needs (09/08/2024)   PRAPARE - Administrator, Civil Service (Medical): No    Lack of Transportation (Non-Medical): No  Physical Activity: Sufficiently Active (09/08/2024)   Exercise Vital Sign    Days of Exercise per Week: 3 days    Minutes of Exercise per Session: 60 min  Stress: No Stress Concern Present (09/08/2024)   [employer] of Occupational Health - Occupational Stress Questionnaire    Feeling of Stress: Not at all  Social Connections: Moderately Integrated (09/08/2024)   Social Connection and Isolation Panel    Frequency of Communication with Friends and Family: More than three times a week    Frequency of Social Gatherings with Friends and Family: More than three times a week    Attends Religious Services: More than 4 times per year    Active Member of  Clubs or Organizations: Yes    Attends Banker Meetings: More than 4 times per year    Marital Status: Never married  Intimate Partner Violence: Not At Risk (09/08/2024)   Humiliation, Afraid, Rape, and Kick questionnaire    Fear of Current or Ex-Partner: No    Emotionally Abused: No    Physically Abused: No    Sexually Abused: No    Past Surgical History:  Procedure Laterality Date   APPENDECTOMY  1980's   BREAST EXCISIONAL BIOPSY Left 1989   COLONOSCOPY  last one 06-18-2015   INGUINAL HERNIA REPAIR Left 05-10-2001   LAPAROSCOPIC LYSIS OF ADHESIONS  01/17/2019   OOPHORECTOMY Right 01/17/2019   PERINEOPLASTY  01/17/2019   SALPINGECTOMY Left 01/17/2019   TOTAL HIP ARTHROPLASTY Right 04/09/2020   Procedure: RIGH TOTAL HIP ARTHROPLASTY ANTERIOR APPROACH;  Surgeon: Sheril Coy, MD;  Location: [facility];  Service: Orthopedics;  Laterality: Right;   TOTAL HIP ARTHROPLASTY Left 01/28/2021   Procedure: LEFT TOTAL HIP ARTHROPLASTY ANTERIOR APPROACH;  Surgeon: Sheril Coy, MD;  Location: [facility];  Service: Orthopedics;  Laterality: Left;   TOTAL KNEE ARTHROPLASTY Left 07/20/2023   Procedure: LEFT TOTAL KNEE ARTHROPLASTY;  Surgeon: Sheril Coy, MD;  Location: [facility];  Service: Orthopedics;  Laterality: Left;   VAGINAL HYSTERECTOMY  1980's   VAGINAL PROLAPSE REPAIR N/A 03/02/2016   Procedure: COLOPLAST ANTERIOR  VAULT REPAIR WITH AXIS DERMIS. SACROSPINUS FIXATION, AUGMENTATION WITH AXIS DERMIS;  Surgeon: Arlena Gal, MD;  [facility];  Service: Urology;  Laterality: N/A;    Family History  Problem Relation Age of Onset   Aneurysm Mother 79       deceased secondary to brain aneurysm   Liver cancer Father 35       deceased   Diabetes Other        grandmother   Colon cancer Neg Hx    Stomach cancer Neg Hx    Rectal cancer Neg Hx    Esophageal cancer Neg Hx    Breast cancer Neg Hx     Allergies  Allergen Reactions   Flexeril   [Cyclobenzaprine ]     Hallucinations    Lisinopril  Cough   Norvasc  [Amlodipine ] Swelling    Lower extremity edema    Penicillins Hives    Has patient had a PCN reaction causing immediate rash, facial/tongue/throat swelling, SOB or lightheadedness with hypotension: Yes Has patient had a PCN reaction causing severe rash involving mucus membranes or skin necrosis:  No Has patient had a PCN reaction that required hospitalization: No Has patient had a PCN reaction occurring within the last 10 years: No If all of the above answers are NO, then may proceed with Cephalosporin use.     Current Outpatient Medications on File Prior to Visit  Medication Sig Dispense Refill   acetaminophen  (TYLENOL  8 HOUR) 650 MG CR tablet Take 1 tablet (650 mg total) by mouth every 8 (eight) hours as needed for pain or fever. 30 tablet 0   Ascorbic Acid (VITAMIN C) 1000 MG tablet Take 1,000 mg by mouth daily.     aspirin  81 MG tablet Take 1 tablet (81 mg total) by mouth 2 (two) times daily after a meal. 60 tablet 0   b complex vitamins capsule Take 1 capsule by mouth daily.     benzonatate  (TESSALON ) 200 MG capsule Take 1 capsule (200 mg total) by mouth 3 (three) times daily as needed for cough. 30 capsule 1   Biotin 1000 MCG tablet Take 1,000 mcg by mouth daily.     Blood Glucose Monitoring Suppl DEVI Dispense based on patient and insurance preference. Use up to four times daily as directed.  Dx E11.9 1 each 0   Calcium  Carbonate-Vitamin D  600-400 MG-UNIT tablet Take 1 tablet by mouth daily.     Chromium 1000 MCG TABS Take 1,000 mcg by mouth daily.     Cinnamon 500 MG capsule Take 1,000 mg by mouth daily.     CRANBERRY PO Take 2 tablets by mouth daily. 4200 mg     doxycycline  (VIBRAMYCIN ) 100 MG capsule Take 1 capsule (100 mg total) by mouth 2 (two) times daily. 14 capsule 0   DULoxetine  (CYMBALTA ) 20 MG capsule TAKE 1 CAPSULE(20 MG) BY MOUTH DAILY 90 capsule 0   ELDERBERRY PO Take 1 capsule by mouth daily.      estradiol  (ESTRACE ) 0.1 MG/GM vaginal cream Place 1 Applicatorful vaginally every other day. In the evening  3   fluticasone  (FLONASE ) 50 MCG/ACT nasal spray SHAKE LIQUID AND USE 2 SPRAYS IN EACH NOSTRIL DAILY 16 g 6   gabapentin  (NEURONTIN ) 100 MG capsule Take 1-3 capsules (100-300 mg total) by mouth at bedtime as needed. 30 capsule 3   Glucose Blood (BLOOD GLUCOSE TEST STRIPS) STRP Dispense based on patient and insurance preference. Use up to four times daily as directed. Dx E11.9 100 strip 0   glucose blood (ONETOUCH VERIO) test strip 1 each by Other route 2 (two) times daily. Use as instructed 200 strip 3   ibuprofen  (ADVIL ) 400 MG tablet Take 1 tablet (400 mg total) by mouth every 6 (six) hours as needed. 20 tablet 0   Lancet Device MISC Dispense based on patient and insurance preference. Use up to four times daily as directed. Dx E11.9 1 each 0   Lancets MISC Dispense based on patient and insurance preference. Use up to four times daily as directed. Dx E11.9 100 each 0   lidocaine  4 % Place 1 patch onto the skin 2 (two) times daily. 12 patch 0   meclizine  (ANTIVERT ) 25 MG tablet TAKE 1 TABLET(25 MG) BY MOUTH THREE TIMES DAILY AS NEEDED FOR DIZZINESS 30 tablet 1   meloxicam  (MOBIC ) 15 MG tablet Take 1 tablet (15 mg total) by mouth daily as needed. 60 tablet 0   meloxicam  (MOBIC ) 15 MG tablet Take 1 tablet (15 mg total) by mouth daily as needed. 60 tablet 0   methocarbamol  (ROBAXIN ) 500 MG tablet Take 1 tablet (  500 mg total) by mouth every 8 (eight) hours as needed for muscle spasms. 30 tablet 1   Misc Natural Products (TART CHERRY ADVANCED) CAPS Take 2 capsules by mouth daily.     Multiple Vitamins-Minerals (MULTIVITAMIN WITH MINERALS) tablet Take 1 tablet by mouth daily. Centrum silver     MYRBETRIQ  25 MG TB24 tablet Take 25 mg by mouth at bedtime.     olmesartan  (BENICAR ) 20 MG tablet TAKE 1 TABLET(20 MG) BY MOUTH DAILY 90 tablet 1   OVER THE COUNTER MEDICATION Take 1 tablet by mouth daily as  needed (constipation). Vital Lax     polyethylene glycol (MIRALAX  / GLYCOLAX ) packet Take 17 g by mouth daily as needed for mild constipation.     rosuvastatin  (CRESTOR ) 40 MG tablet TAKE 1 TABLET(40 MG) BY MOUTH DAILY 90 tablet 1   Turmeric 500 MG CAPS Take 1,000 mg by mouth daily.      [DISCONTINUED] valsartan  (DIOVAN ) 160 MG tablet Take 1/2 tablet by mouth once daily 30 tablet 5   No current facility-administered medications on file prior to visit.    Temp 98 F (36.7 C) (Oral)   Ht 5' 5 (1.651 m)   Wt 160 lb (72.6 kg)   BMI 26.63 kg/m       Objective:   Physical Exam Vitals and nursing note reviewed.  Constitutional:      Appearance: Normal appearance.  Cardiovascular:     Rate and Rhythm: Normal rate and regular rhythm.     Pulses: Normal pulses.     Heart sounds: Normal heart sounds.  Pulmonary:     Effort: Pulmonary effort is normal.     Breath sounds: Normal breath sounds.  Abdominal:     General: Abdomen is flat. Bowel sounds are normal.     Palpations: Abdomen is soft.     Tenderness: There is abdominal tenderness in the left lower quadrant.      Comments: Pain to left lower quadrant/inguinal fold with palpation and ROM of left hip. No masses felt   Musculoskeletal:     Left hip: Tenderness present. No deformity, bony tenderness or crepitus. Normal range of motion.  Neurological:     Mental Status: She is alert.           Assessment & Plan:   Assessment and Plan    Left lower abdominal pain - POC UA negative. Will order CT abdomen. Cannot r/o pain from left hip.  - Ordered CT scan of the abdomen with contrast - Will refer to GYN locally  Type 2 diabetes mellitus Due for routine glycemic control monitoring. - Checked A1c level during blood work.      I personally spent a total of 31 minutes in the care of the patient today including preparing to see the patient, getting/reviewing separately obtained history, performing a medically appropriate  exam/evaluation, counseling and educating, placing orders, and documenting clinical information in the EHR.

## 2024-10-17 NOTE — Progress Notes (Unsigned)
 [name] [address] [place] [address] Phone: [phone] Subjective:   Cathy [name], am serving as a scribe for Cathy [name].  I'm seeing this patient by the request  of:  Cathy Huxley, NP  CC: Bilateral wrist pain  YEP:[name]  Cathy [name] is a 78 y.o. female coming in with complaint of B carpal tunnel. Here for wrist injections. Saw Cathy [name] on 10/12/2024 who plans on doing carpal tunnel release after holidays. Patient states that her pain started last week while preparing for Thanksgiving. L wrist pain > R wrist.       Past Medical History:  Diagnosis Date   Chronic constipation    DJD (degenerative joint disease)    knees   Female cystocele    GERD (gastroesophageal reflux disease)    History of esophagitis    History of left inguinal hernia    History of MRSA infection    recurrent carbuncle   History of recurrent UTIs    Hyperlipidemia    Hypertension    Pre-diabetes    diet controlled   Urgency of urination    Past Surgical History:  Procedure Laterality Date   APPENDECTOMY  1980's   BREAST EXCISIONAL BIOPSY Left 1989   COLONOSCOPY  last one 06-18-2015   INGUINAL HERNIA REPAIR Left 05-10-2001   LAPAROSCOPIC LYSIS OF ADHESIONS  01/17/2019   OOPHORECTOMY Right 01/17/2019   PERINEOPLASTY  01/17/2019   SALPINGECTOMY Left 01/17/2019   TOTAL HIP ARTHROPLASTY Right 04/09/2020   Procedure: RIGH TOTAL HIP ARTHROPLASTY ANTERIOR APPROACH;  Surgeon: Cathy Coy, MD;  Location: [facility];  Service: Orthopedics;  Laterality: Right;   TOTAL HIP ARTHROPLASTY Left 01/28/2021   Procedure: LEFT TOTAL HIP ARTHROPLASTY ANTERIOR APPROACH;  Surgeon: Cathy Coy, MD;  Location: [facility];  Service: Orthopedics;  Laterality: Left;   TOTAL KNEE ARTHROPLASTY Left 07/20/2023   Procedure: LEFT TOTAL KNEE ARTHROPLASTY;  Surgeon: Cathy Coy, MD;  Location: [facility];  Service: Orthopedics;  Laterality: Left;   VAGINAL  HYSTERECTOMY  1980's   VAGINAL PROLAPSE REPAIR N/A 03/02/2016   Procedure: COLOPLAST ANTERIOR  VAULT REPAIR WITH AXIS DERMIS. SACROSPINUS FIXATION, AUGMENTATION WITH AXIS DERMIS;  Surgeon: Cathy Gal, MD;  Location: [facility];  Service: Urology;  Laterality: N/A;   Social History   Socioeconomic History   Marital status: Single    Spouse name: Not on file   Number of children: Not on file   Years of education: Not on file   Highest education level: Not on file  Occupational History   Not on file  Tobacco Use   Smoking status: Former    Current packs/day: 0.00    Types: Cigarettes    Start date: 02/28/1995    Quit date: 02/28/1996    Years since quitting: 28.6   Smokeless tobacco: Never  Vaping Use   Vaping status: Never Used  Substance and Sexual Activity   Alcohol use: Yes    Alcohol/week: 4.0 standard drinks of alcohol    Types: 4 Standard drinks or equivalent per week    Comment: on weekends   Drug use: No   Sexual activity: Yes    Birth control/protection: Surgical  Other Topics Concern   Not on file  Social History Narrative   Single   Former Smoker  -  quit 10 to 11 years ago (light smoker)   Alcohol use-yes     2 children    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    Social Drivers of Health   Financial Resource Strain: Low Risk  (09/08/2024)   Overall Financial Resource Strain (CARDIA)    Difficulty of Paying Living Expenses: Not hard at all  Food Insecurity: No Food Insecurity (09/08/2024)   Hunger Vital Sign    Worried About Running Out of Food in the Last Year: Never true    Ran Out of Food in the Last Year: Never true  Transportation Needs: No Transportation Needs (09/08/2024)   PRAPARE - Administrator, Civil Service (Medical): No    Lack of Transportation (Non-Medical): No  Physical Activity: Sufficiently Active (09/08/2024)   Exercise Vital Sign    Days of Exercise per Week: 3 days    Minutes of Exercise per  Session: 60 min  Stress: No Stress Concern Present (09/08/2024)   [employer] of Occupational Health - Occupational Stress Questionnaire    Feeling of Stress: Not at all  Social Connections: Moderately Integrated (09/08/2024)   Social Connection and Isolation Panel    Frequency of Communication with Friends and Family: More than three times a week    Frequency of Social Gatherings with Friends and Family: More than three times a week    Attends Religious Services: More than 4 times per year    Active Member of [employer] or Organizations: Yes    Attends Engineer, Structural: More than 4 times per year    Marital Status: Never married   Allergies  Allergen Reactions   Flexeril  [Cyclobenzaprine ]     Hallucinations    Lisinopril  Cough   Norvasc  [Amlodipine ] Swelling    Lower extremity edema    Penicillins Hives    Has patient had a PCN reaction causing immediate rash, facial/tongue/throat swelling, SOB or lightheadedness with hypotension: Yes Has patient had a PCN reaction causing severe rash involving mucus membranes or skin necrosis: No Has patient had a PCN reaction that required hospitalization: No Has patient had a PCN reaction occurring within the last 10 years: No If all of the above answers are NO, then may proceed with Cephalosporin use.    Family History  Problem Relation Age of Onset   Aneurysm Mother 43       deceased secondary to brain aneurysm   Liver cancer Father 26       deceased   Diabetes Other        grandmother   Colon cancer Neg Hx    Stomach cancer Neg Hx    Rectal cancer Neg Hx    Esophageal cancer Neg Hx    Breast cancer Neg Hx      Current Outpatient Medications (Cardiovascular):    olmesartan  (BENICAR ) 20 MG tablet, TAKE 1 TABLET(20 MG) BY MOUTH DAILY   rosuvastatin  (CRESTOR ) 40 MG tablet, TAKE 1 TABLET(40 MG) BY MOUTH DAILY  Current Outpatient Medications (Respiratory):    benzonatate  (TESSALON ) 200 MG capsule, Take 1 capsule (200  mg total) by mouth 3 (three) times daily as needed for cough.   fluticasone  (FLONASE ) 50 MCG/ACT nasal spray, SHAKE LIQUID AND USE 2 SPRAYS IN EACH NOSTRIL DAILY  Current Outpatient Medications (Analgesics):    acetaminophen  (TYLENOL  8 HOUR) 650 MG CR tablet, Take 1 tablet (650 mg total) by mouth every 8 (eight) hours as needed for pain or fever.   aspirin  81 MG tablet, Take 1 tablet (81 mg total) by mouth 2 (two) times daily after a meal.   ibuprofen  (ADVIL ) 400 MG tablet, Take 1 tablet (400 mg  total) by mouth every 6 (six) hours as needed.   meloxicam  (MOBIC ) 15 MG tablet, Take 1 tablet (15 mg total) by mouth daily as needed.   meloxicam  (MOBIC ) 15 MG tablet, Take 1 tablet (15 mg total) by mouth daily as needed.   Current Outpatient Medications (Other):    Ascorbic Acid (VITAMIN C) 1000 MG tablet, Take 1,000 mg by mouth daily.   b complex vitamins capsule, Take 1 capsule by mouth daily.   Biotin 1000 MCG tablet, Take 1,000 mcg by mouth daily.   Blood Glucose Monitoring Suppl DEVI, Dispense based on patient and insurance preference. Use up to four times daily as directed.  Dx E11.9   Calcium  Carbonate-Vitamin D  600-400 MG-UNIT tablet, Take 1 tablet by mouth daily.   Chromium 1000 MCG TABS, Take 1,000 mcg by mouth daily.   Cinnamon 500 MG capsule, Take 1,000 mg by mouth daily.   CRANBERRY PO, Take 2 tablets by mouth daily. 4200 mg   doxycycline  (VIBRAMYCIN ) 100 MG capsule, Take 1 capsule (100 mg total) by mouth 2 (two) times daily.   DULoxetine  (CYMBALTA ) 20 MG capsule, TAKE 1 CAPSULE(20 MG) BY MOUTH DAILY   ELDERBERRY PO, Take 1 capsule by mouth daily.   estradiol  (ESTRACE ) 0.1 MG/GM vaginal cream, Place 1 Applicatorful vaginally every other day. In the evening   gabapentin  (NEURONTIN ) 100 MG capsule, Take 1-3 capsules (100-300 mg total) by mouth at bedtime as needed.   Glucose Blood (BLOOD GLUCOSE TEST STRIPS) STRP, Dispense based on patient and insurance preference. Use up to four times  daily as directed. Dx E11.9   glucose blood (ONETOUCH VERIO) test strip, 1 each by Other route 2 (two) times daily. Use as instructed   Lancet Device MISC, Dispense based on patient and insurance preference. Use up to four times daily as directed. Dx E11.9   Lancets MISC, Dispense based on patient and insurance preference. Use up to four times daily as directed. Dx E11.9   lidocaine  4 %, Place 1 patch onto the skin 2 (two) times daily.   meclizine  (ANTIVERT ) 25 MG tablet, TAKE 1 TABLET(25 MG) BY MOUTH THREE TIMES DAILY AS NEEDED FOR DIZZINESS   methocarbamol  (ROBAXIN ) 500 MG tablet, Take 1 tablet (500 mg total) by mouth every 8 (eight) hours as needed for muscle spasms.   Misc Natural Products (TART CHERRY ADVANCED) CAPS, Take 2 capsules by mouth daily.   Multiple Vitamins-Minerals (MULTIVITAMIN WITH MINERALS) tablet, Take 1 tablet by mouth daily. Centrum silver   MYRBETRIQ  25 MG TB24 tablet, Take 25 mg by mouth at bedtime.   OVER THE COUNTER MEDICATION, Take 1 tablet by mouth daily as needed (constipation). Vital Lax   polyethylene glycol (MIRALAX  / GLYCOLAX ) packet, Take 17 g by mouth daily as needed for mild constipation.   Turmeric 500 MG CAPS, Take 1,000 mg by mouth daily.    Reviewed prior external information including notes and imaging from  primary care provider As well as notes that were available from care everywhere and other healthcare systems.  Past medical history, social, surgical and family history all reviewed in electronic medical record.  No pertanent information unless stated regarding to the chief complaint.   Review of Systems:  No headache, visual changes, nausea, vomiting, diarrhea, constipation, dizziness, abdominal pain, skin rash, fevers, chills, night sweats, weight loss, swollen lymph nodes, body aches, joint swelling, chest pain, shortness of breath, mood changes. POSITIVE muscle aches  Objective  Blood pressure (!) 154/100, pulse 83, height 5' 5 (1.651 m),  weight 162 lb (73.5 kg), SpO2 98%.   General: No apparent distress alert and oriented x3 mood and affect normal, dressed appropriately.  HEENT: Pupils equal, extraocular movements intact  Respiratory: Patient's speak in full sentences and does not appear short of breath  Cardiovascular: No lower extremity edema, non tender, no erythema  Bilateral wrist examination is a patient does have positive Tinel's noted.  Some mild thenar eminence wasting noted.  Patient does have some tightness with positive weakness of 4 out of 5 strength with the strength testing of the thenar eminence bilaterally.  Still arthritic changes of the right knee noted.  Procedure: Real-time Ultrasound Guided Injection of right carpal tunnel Device: GE Logiq Q7  Ultrasound guided injection is preferred based studies that show increased duration, increased effect, greater accuracy, decreased procedural pain, increased response rate with ultrasound guided versus blind injection.  Verbal informed consent obtained.  Time-out conducted.  Noted no overlying erythema, induration, or other signs of local infection.  Skin prepped in a sterile fashion.  Local anesthesia: Topical Ethyl chloride.  With sterile technique and under real time ultrasound guidance:  median nerve visualized.  23g 5/8 inch needle inserted distal to proximal approach into nerve sheath. Pictures taken nfor needle placement. Patient did have injection of  1 cc of 0.5% Marcaine , and 0.5 cc of Kenalog  40 mg/dL. Completed without difficulty  Pain immediately resolved suggesting accurate placement of the medication.  Images saved Advised to call if fevers/chills, erythema, induration, drainage, or persistent bleeding.  Impression: Technically successful ultrasound guided injection.  Procedure: Real-time Ultrasound Guided Injection of left carpal tunnel Device: GE Logiq Q7 Ultrasound guided injection is preferred based studies that show increased duration,  increased effect, greater accuracy, decreased procedural pain, increased response rate with ultrasound guided versus blind injection.  Verbal informed consent obtained.  Time-out conducted.  Noted no overlying erythema, induration, or other signs of local infection.  Skin prepped in a sterile fashion.  Local anesthesia: Topical Ethyl chloride.  With sterile technique and under real time ultrasound guidance:  median nerve visualized.  23g 5/8 inch needle inserted distal to proximal approach into nerve sheath. Pictures taken nfor needle placement. Patient did have injection of 2 cc of 0.5% Marcaine , and 0.5 cc of Kenalog  40 mg/dL. Completed without difficulty  Pain immediately resolved suggesting accurate placement of the medication.  Advised to call if fevers/chills, erythema, induration, drainage, or persistent bleeding.  Images permanently stored  Impression: Technically successful ultrasound guided injection.    Impression and Recommendations:     The above documentation has been reviewed and is accurate and complete Earsel Shouse M Cieanna Stormes, DO

## 2024-10-24 ENCOUNTER — Ambulatory Visit: Admitting: Family Medicine

## 2024-10-24 ENCOUNTER — Encounter: Payer: Self-pay | Admitting: Family Medicine

## 2024-10-24 ENCOUNTER — Other Ambulatory Visit: Payer: Self-pay

## 2024-10-24 VITALS — BP 154/100 | HR 83 | Ht 65.0 in | Wt 162.0 lb

## 2024-10-24 DIAGNOSIS — G5603 Carpal tunnel syndrome, bilateral upper limbs: Secondary | ICD-10-CM

## 2024-10-24 DIAGNOSIS — M1711 Unilateral primary osteoarthritis, right knee: Secondary | ICD-10-CM

## 2024-10-24 DIAGNOSIS — M25531 Pain in right wrist: Secondary | ICD-10-CM

## 2024-10-24 NOTE — Patient Instructions (Addendum)
 Injected both wrists See me in 10 weeks to do wrist and knee

## 2024-10-24 NOTE — Assessment & Plan Note (Signed)
 Patient does have significant discomfort noted, discussed icing regimen and home exercises.  Discussed with patient that if continuing to have trouble surgery is an option.  Patient wants to avoid that of course.  Discussed icing regimen, home exercises, follow-up again in 6 to 12 weeks discussed bracing at night.

## 2024-10-24 NOTE — Assessment & Plan Note (Signed)
 Able to do an injection again in the knee and the wrist at the same time if necessary.  Discussed which activities to do and which ones to avoid.  Increase activity slowly.  Discussed icing regimen.  Patient will wear brace when needed.  Follow-up again in 6 to 12 weeks

## 2024-10-27 ENCOUNTER — Other Ambulatory Visit

## 2024-11-01 ENCOUNTER — Inpatient Hospital Stay: Admission: RE | Admit: 2024-11-01 | Discharge: 2024-11-01 | Attending: Adult Health

## 2024-11-01 DIAGNOSIS — R1030 Lower abdominal pain, unspecified: Secondary | ICD-10-CM

## 2024-11-01 MED ORDER — IOPAMIDOL (ISOVUE-370) INJECTION 76%
80.0000 mL | Freq: Once | INTRAVENOUS | Status: AC | PRN
  Administered 2024-11-01: 14:00:00 80 mL via INTRAVENOUS

## 2024-11-03 ENCOUNTER — Telehealth: Payer: Self-pay

## 2024-11-03 NOTE — Telephone Encounter (Signed)
 Copied from CRM #[id number]. Topic: Clinical - Lab/Test Results >> Nov 03, 2024  8:08 AM Adelita E wrote: Reason for CRM: Patient was returning a call to [facility] in regards to CT results. Called [facility] and Marjorie was currently busy, and they stated that she will call the patient back when available. >> Nov 03, 2024  4:41 PM Drema MATSU wrote: Patient is returning a call from Sareen Randon.  >> Nov 03, 2024  3:54 PM Rea C wrote: Patient is returning call to speak with Marjorie or nurse in regards to CT Scan results.

## 2024-11-03 NOTE — Telephone Encounter (Signed)
 Pt notified of results

## 2024-11-07 ENCOUNTER — Other Ambulatory Visit: Payer: Self-pay | Admitting: Adult Health

## 2024-11-07 DIAGNOSIS — K869 Disease of pancreas, unspecified: Secondary | ICD-10-CM

## 2024-11-30 ENCOUNTER — Telehealth: Payer: Self-pay | Admitting: *Deleted

## 2024-11-30 NOTE — Telephone Encounter (Signed)
 Copied from CRM #[id number]. Topic: Referral - Question >> Nov 30, 2024  8:54 AM Ivette P wrote: Reason for CRM: Pt was referred to Gyno but was denied due to having medicaid. Is there another referral can be made to someone who accepts medicaid.    Please follow up with pt regarding referral

## 2024-11-30 NOTE — Telephone Encounter (Signed)
 Copied from CRM #[id number]. Topic: Clinical - Medication Question >> Nov 30, 2024  8:56 AM Ivette P wrote: Reason for CRM: Pt was told by [facility] - Gustav Kitty that PCP needs to order cream for pt.    Pt would ike estradiol  001% - pt uses every other day and needs to know if this medication can be prescribed.

## 2024-12-01 NOTE — Telephone Encounter (Signed)
 Please advise if you know of any that are accepting pt insurance.

## 2024-12-06 ENCOUNTER — Ambulatory Visit: Admitting: Adult Health

## 2024-12-09 ENCOUNTER — Other Ambulatory Visit: Payer: Self-pay | Admitting: Adult Health

## 2024-12-10 ENCOUNTER — Ambulatory Visit
Admission: RE | Admit: 2024-12-10 | Discharge: 2024-12-10 | Disposition: A | Source: Ambulatory Visit | Attending: Adult Health

## 2024-12-10 ENCOUNTER — Other Ambulatory Visit

## 2024-12-10 DIAGNOSIS — K869 Disease of pancreas, unspecified: Secondary | ICD-10-CM

## 2024-12-10 MED ORDER — GADOPICLENOL 0.5 MMOL/ML IV SOLN
7.0000 mL | Freq: Once | INTRAVENOUS | Status: AC | PRN
  Administered 2024-12-10: 7 mL via INTRAVENOUS

## 2024-12-12 ENCOUNTER — Ambulatory Visit: Payer: Self-pay | Admitting: Adult Health

## 2024-12-12 ENCOUNTER — Telehealth: Payer: Self-pay

## 2024-12-12 DIAGNOSIS — M25531 Pain in right wrist: Secondary | ICD-10-CM

## 2024-12-12 NOTE — Telephone Encounter (Signed)
 Copied from CRM #[id number]. Topic: Referral - Question >> Dec 12, 2024 11:22 AM Mercedes MATSU wrote: Reason for CRM: Patient called in to know if Darleene wants to recommend her to Dr. Arthea Sharps. Patient can be reached at [phone].

## 2024-12-12 NOTE — Telephone Encounter (Signed)
 Pt stated that she has to get a new referral to see Dr. Claudene due to insurance. Referral placed.

## 2024-12-12 NOTE — Telephone Encounter (Signed)
 Copied from CRM #[id number]. Topic: Clinical - Lab/Test Results >> Dec 12, 2024 11:23 AM Mercedes MATSU wrote: Reason for CRM: Patient called in to go over her imaging results. Is requesting a call back and can be reached at [phone].

## 2024-12-12 NOTE — Telephone Encounter (Signed)
 Left message to return phone call.

## 2024-12-12 NOTE — Addendum Note (Signed)
 Addended by: VICCI LEADER R on: 12/12/2024 04:58 PM   Modules accepted: Orders

## 2024-12-12 NOTE — Telephone Encounter (Signed)
 Pt given imaging results and verbalized understanding.

## 2024-12-27 ENCOUNTER — Telehealth: Payer: Self-pay | Admitting: Adult Health

## 2024-12-27 NOTE — Telephone Encounter (Unsigned)
 Copied from CRM [id number]. Topic: Referral - Request for Referral >> Dec 27, 2024  9:45 AM Eva FALCON wrote: Did the patient discuss referral with their provider in the last year? No (If No - schedule appointment) (If Yes - send message)  Appointment offered? Yes, declined appointment   Type of order/referral and detailed reason for visit: Physical Therapy/ had surgery on hand   Preference of office, provider, location: [facility] [address] [place], [place] [address]  If referral order, have you been seen by this specialty before? No (If Yes, this issue or another issue? When? Where?  Can we respond through MyChart? No

## 2024-12-27 NOTE — Telephone Encounter (Signed)
 Spoke to pt and she stated that a referral is needed bc of [facility]. Will send to referral coordinator for assistance.

## 2024-12-29 ENCOUNTER — Telehealth: Payer: Self-pay

## 2024-12-29 ENCOUNTER — Other Ambulatory Visit: Payer: Self-pay | Admitting: Adult Health

## 2024-12-29 DIAGNOSIS — R051 Acute cough: Secondary | ICD-10-CM

## 2024-12-29 NOTE — Progress Notes (Unsigned)
 " [name] [address] [place] [address] Phone: [phone] Subjective:    I'm seeing this patient by the request  of:  Merna Huxley, NP  CC:   YEP:[name]  10/24/2024 Able to do an injection again in the knee and the wrist at the same time if necessary.  Discussed which activities to do and which ones to avoid.  Increase activity slowly.  Discussed icing regimen.  Patient will wear brace when needed.  Follow-up again in 6 to 12 weeks     Patient does have significant discomfort noted, discussed icing regimen and home exercises. Discussed with patient that if continuing to have trouble surgery is an option. Patient wants to avoid that of course. Discussed icing regimen, home exercises, follow-up again in 6 to 12 weeks discussed bracing at night.   Update 01/02/2025 Cathy [name] is a 79 y.o. female coming in with complaint of B wrist and R knee pain. Patient states       Past Medical History:  Diagnosis Date   Chronic constipation    DJD (degenerative joint disease)    knees   Female cystocele    GERD (gastroesophageal reflux disease)    History of esophagitis    History of left inguinal hernia    History of MRSA infection    recurrent carbuncle   History of recurrent UTIs    Hyperlipidemia    Hypertension    Pre-diabetes    diet controlled   Urgency of urination    Past Surgical History:  Procedure Laterality Date   APPENDECTOMY  1980's   BREAST EXCISIONAL BIOPSY Left 1989   COLONOSCOPY  last one 06-18-2015   INGUINAL HERNIA REPAIR Left 05-10-2001   LAPAROSCOPIC LYSIS OF ADHESIONS  01/17/2019   OOPHORECTOMY Right 01/17/2019   PERINEOPLASTY  01/17/2019   SALPINGECTOMY Left 01/17/2019   TOTAL HIP ARTHROPLASTY Right 04/09/2020   Procedure: RIGH TOTAL HIP ARTHROPLASTY ANTERIOR APPROACH;  Surgeon: Sheril Coy, MD;  Location: [facility];  Service: Orthopedics;  Laterality: Right;   TOTAL HIP ARTHROPLASTY Left 01/28/2021    Procedure: LEFT TOTAL HIP ARTHROPLASTY ANTERIOR APPROACH;  Surgeon: Sheril Coy, MD;  Location: [facility];  Service: Orthopedics;  Laterality: Left;   TOTAL KNEE ARTHROPLASTY Left 07/20/2023   Procedure: LEFT TOTAL KNEE ARTHROPLASTY;  Surgeon: Sheril Coy, MD;  Location: [facility];  Service: Orthopedics;  Laterality: Left;   VAGINAL HYSTERECTOMY  1980's   VAGINAL PROLAPSE REPAIR N/A 03/02/2016   Procedure: COLOPLAST ANTERIOR  VAULT REPAIR WITH AXIS DERMIS. SACROSPINUS FIXATION, AUGMENTATION WITH AXIS DERMIS;  Surgeon: Arlena Gal, MD;  Location[facility];  Service: Urology;  Laterality: N/A;   Social History   Socioeconomic History   Marital status: Single    Spouse name: Not on file   Number of children: Not on file   Years of education: Not on file   Highest education level: Not on file  Occupational History   Not on file  Tobacco Use   Smoking status: Former    Current packs/day: 0.00    Types: Cigarettes    Start date: 02/28/1995    Quit date: 02/28/1996    Years since quitting: 28.8   Smokeless tobacco: Never  Vaping Use   Vaping status: Never Used  Substance and Sexual Activity   Alcohol use: Yes    Alcohol/week: 4.0 standard drinks of alcohol    Types: 4 Standard drinks or equivalent per week    Comment: on weekends  Drug use: No   Sexual activity: Yes    Birth control/protection: Surgical  Other Topics Concern   Not on file  Social History Narrative   Single   Former Smoker  -  quit 10 to 11 years ago (light smoker)   Alcohol use-yes     2 children    Occupation: [employer]    Social Drivers of Health   Tobacco Use: Medium Risk (10/24/2024)   Patient History    Smoking Tobacco Use: Former    Smokeless Tobacco Use: Never    Passive Exposure: Not on Actuary Strain: Low Risk (09/08/2024)   Overall Financial Resource Strain (CARDIA)    Difficulty of Paying Living Expenses: Not hard at all  Food Insecurity:  No Food Insecurity (09/08/2024)   Epic    Worried About Programme Researcher, Broadcasting/film/video in the Last Year: Never true    Ran Out of Food in the Last Year: Never true  Transportation Needs: No Transportation Needs (09/08/2024)   Epic    Lack of Transportation (Medical): No    Lack of Transportation (Non-Medical): No  Physical Activity: Sufficiently Active (09/08/2024)   Exercise Vital Sign    Days of Exercise per Week: 3 days    Minutes of Exercise per Session: 60 min  Stress: No Stress Concern Present (09/08/2024)   [employer] of Occupational Health - Occupational Stress Questionnaire    Feeling of Stress: Not at all  Social Connections: Moderately Integrated (09/08/2024)   Social Connection and Isolation Panel    Frequency of Communication with Friends and Family: More than three times a week    Frequency of Social Gatherings with Friends and Family: More than three times a week    Attends Religious Services: More than 4 times per year    Active Member of Clubs or Organizations: Yes    Attends Banker Meetings: More than 4 times per year    Marital Status: Never married  Depression (PHQ2-9): Low Risk (09/08/2024)   Depression (PHQ2-9)    PHQ-2 Score: 0  Alcohol Screen: Low Risk (09/08/2024)   Alcohol Screen    Last Alcohol Screening Score (AUDIT): 2  Housing: Unknown (09/08/2024)   Epic    Unable to Pay for Housing in the Last Year: No    Number of Times Moved in the Last Year: Not on file    Homeless in the Last Year: No  Utilities: Not At Risk (09/08/2024)   Epic    Threatened with loss of utilities: No  Health Literacy: Adequate Health Literacy (09/08/2024)   B1300 Health Literacy    Frequency of need for help with medical instructions: Never   Allergies[1] Family History  Problem Relation Age of Onset   Aneurysm Mother 72       deceased secondary to brain aneurysm   Liver cancer Father 52       deceased   Diabetes Other        grandmother   Colon cancer  Neg Hx    Stomach cancer Neg Hx    Rectal cancer Neg Hx    Esophageal cancer Neg Hx    Breast cancer Neg Hx     Current Outpatient Medications (Cardiovascular):    olmesartan  (BENICAR ) 20 MG tablet, TAKE 1 TABLET(20 MG) BY MOUTH DAILY   rosuvastatin  (CRESTOR ) 40 MG tablet, TAKE 1 TABLET(40 MG) BY MOUTH DAILY  Current Outpatient Medications (Respiratory):    benzonatate  (TESSALON ) 200 MG capsule, Take  1 capsule (200 mg total) by mouth 3 (three) times daily as needed for cough.   fluticasone  (FLONASE ) 50 MCG/ACT nasal spray, SHAKE LIQUID AND USE 2 SPRAYS IN EACH NOSTRIL DAILY  Current Outpatient Medications (Analgesics):    acetaminophen  (TYLENOL  8 HOUR) 650 MG CR tablet, Take 1 tablet (650 mg total) by mouth every 8 (eight) hours as needed for pain or fever.   aspirin  81 MG tablet, Take 1 tablet (81 mg total) by mouth 2 (two) times daily after a meal.   ibuprofen  (ADVIL ) 400 MG tablet, Take 1 tablet (400 mg total) by mouth every 6 (six) hours as needed.   meloxicam  (MOBIC ) 15 MG tablet, Take 1 tablet (15 mg total) by mouth daily as needed.   meloxicam  (MOBIC ) 15 MG tablet, Take 1 tablet (15 mg total) by mouth daily as needed.  Current Outpatient Medications (Other):    Ascorbic Acid (VITAMIN C) 1000 MG tablet, Take 1,000 mg by mouth daily.   b complex vitamins capsule, Take 1 capsule by mouth daily.   Biotin 1000 MCG tablet, Take 1,000 mcg by mouth daily.   Blood Glucose Monitoring Suppl DEVI, Dispense based on patient and insurance preference. Use up to four times daily as directed.  Dx E11.9   Calcium  Carbonate-Vitamin D  600-400 MG-UNIT tablet, Take 1 tablet by mouth daily.   Chromium 1000 MCG TABS, Take 1,000 mcg by mouth daily.   Cinnamon 500 MG capsule, Take 1,000 mg by mouth daily.   CRANBERRY PO, Take 2 tablets by mouth daily. 4200 mg   doxycycline  (VIBRAMYCIN ) 100 MG capsule, Take 1 capsule (100 mg total) by mouth 2 (two) times daily.   DULoxetine  (CYMBALTA ) 20 MG capsule, TAKE  1 CAPSULE(20 MG) BY MOUTH DAILY   ELDERBERRY PO, Take 1 capsule by mouth daily.   estradiol  (ESTRACE ) 0.1 MG/GM vaginal cream, Place 1 Applicatorful vaginally every other day. In the evening   gabapentin  (NEURONTIN ) 100 MG capsule, Take 1-3 capsules (100-300 mg total) by mouth at bedtime as needed.   Glucose Blood (BLOOD GLUCOSE TEST STRIPS) STRP, Dispense based on patient and insurance preference. Use up to four times daily as directed. Dx E11.9   glucose blood (ONETOUCH VERIO) test strip, 1 each by Other route 2 (two) times daily. Use as instructed   Lancet Device MISC, Dispense based on patient and insurance preference. Use up to four times daily as directed. Dx E11.9   Lancets MISC, Dispense based on patient and insurance preference. Use up to four times daily as directed. Dx E11.9   lidocaine  4 %, Place 1 patch onto the skin 2 (two) times daily.   meclizine  (ANTIVERT ) 25 MG tablet, TAKE 1 TABLET(25 MG) BY MOUTH THREE TIMES DAILY AS NEEDED FOR DIZZINESS   methocarbamol  (ROBAXIN ) 500 MG tablet, Take 1 tablet (500 mg total) by mouth every 8 (eight) hours as needed for muscle spasms.   Misc Natural Products (TART CHERRY ADVANCED) CAPS, Take 2 capsules by mouth daily.   Multiple Vitamins-Minerals (MULTIVITAMIN WITH MINERALS) tablet, Take 1 tablet by mouth daily. Centrum silver   MYRBETRIQ  25 MG TB24 tablet, Take 25 mg by mouth at bedtime.   OVER THE COUNTER MEDICATION, Take 1 tablet by mouth daily as needed (constipation). Vital Lax   polyethylene glycol (MIRALAX  / GLYCOLAX ) packet, Take 17 g by mouth daily as needed for mild constipation.   Turmeric 500 MG CAPS, Take 1,000 mg by mouth daily.    Reviewed prior external information including notes and imaging from  primary care provider As well as notes that were available from care everywhere and other healthcare systems.  Past medical history, social, surgical and family history all reviewed in electronic medical record.  No pertanent  information unless stated regarding to the chief complaint.   Review of Systems:  No headache, visual changes, nausea, vomiting, diarrhea, constipation, dizziness, abdominal pain, skin rash, fevers, chills, night sweats, weight loss, swollen lymph nodes, body aches, joint swelling, chest pain, shortness of breath, mood changes. POSITIVE muscle aches  Objective  There were no vitals taken for this visit.   General: No apparent distress alert and oriented x3 mood and affect normal, dressed appropriately.  HEENT: Pupils equal, extraocular movements intact  Respiratory: Patient's speak in full sentences and does not appear short of breath  Cardiovascular: No lower extremity edema, non tender, no erythema      Impression and Recommendations:           [1]  Allergies Allergen Reactions   Flexeril  [Cyclobenzaprine ]     Hallucinations    Lisinopril  Cough   Norvasc  [Amlodipine ] Swelling    Lower extremity edema    Penicillins Hives    Has patient had a PCN reaction causing immediate rash, facial/tongue/throat swelling, SOB or lightheadedness with hypotension: Yes Has patient had a PCN reaction causing severe rash involving mucus membranes or skin necrosis: No Has patient had a PCN reaction that required hospitalization: No Has patient had a PCN reaction occurring within the last 10 years: No If all of the above answers are NO, then may proceed with Cephalosporin use.    "

## 2024-12-29 NOTE — Telephone Encounter (Signed)
 Copied from CRM #[id number]. Topic: Clinical - Medication Refill >> Dec 29, 2024  9:26 AM Montie POUR wrote: Medication:  benzonatate  (TESSALON ) 200 MG capsule  Has the patient contacted their pharmacy? Yes (Agent: If no, request that the patient contact the pharmacy for the refill. If patient does not wish to contact the pharmacy document the reason why and proceed with request.) (Agent: If yes, when and what did the pharmacy advise?) Pharmacy needs order to refill  This is the patient's preferred pharmacy:  [facility] [address], [place] - [address] AT [address] [place] [address] Phone: [phone] Fax: [phone]  Is this the correct pharmacy for this prescription? Yes If no, delete pharmacy and type the correct one.   Has the prescription been filled recently? No  Is the patient out of the medication? Yes - She has been out for 1 week  Has the patient been seen for an appointment in the last year OR does the patient have an upcoming appointment? Yes  Can we respond through MyChart? No  Agent: Please be advised that Rx refills may take up to 3 business days. We ask that you follow-up with your pharmacy.

## 2025-01-02 ENCOUNTER — Ambulatory Visit: Admitting: Family Medicine

## 2025-09-14 ENCOUNTER — Ambulatory Visit
